# Patient Record
Sex: Male | Born: 1965
Health system: Southern US, Community
[De-identification: ages and names within clinical notes are randomized; demographics above are authoritative.]

## PROBLEM LIST (undated history)

## (undated) DIAGNOSIS — M87059 Idiopathic aseptic necrosis of unspecified femur: Secondary | ICD-10-CM

## (undated) DIAGNOSIS — Z77090 Contact with and (suspected) exposure to asbestos: Secondary | ICD-10-CM

## (undated) DIAGNOSIS — J449 Chronic obstructive pulmonary disease, unspecified: Secondary | ICD-10-CM

## (undated) DIAGNOSIS — M549 Dorsalgia, unspecified: Secondary | ICD-10-CM

## (undated) DIAGNOSIS — J439 Emphysema, unspecified: Secondary | ICD-10-CM

## (undated) DIAGNOSIS — M810 Age-related osteoporosis without current pathological fracture: Secondary | ICD-10-CM

## (undated) DIAGNOSIS — J61 Pneumoconiosis due to asbestos and other mineral fibers: Secondary | ICD-10-CM

## (undated) DIAGNOSIS — Z9981 Dependence on supplemental oxygen: Secondary | ICD-10-CM

## (undated) DIAGNOSIS — G629 Polyneuropathy, unspecified: Secondary | ICD-10-CM

## (undated) DIAGNOSIS — D491 Neoplasm of unspecified behavior of respiratory system: Secondary | ICD-10-CM

## (undated) HISTORY — PX: JOINT REPLACEMENT: SHX530

## (undated) HISTORY — PX: BACK SURGERY: SHX140

## (undated) HISTORY — PX: LUNG SURGERY: SHX703

## (undated) HISTORY — PX: EYE SURGERY: SHX253

---

## 1998-11-21 ENCOUNTER — Encounter: Payer: Self-pay | Admitting: Neurosurgery

## 1998-11-21 ENCOUNTER — Ambulatory Visit (HOSPITAL_COMMUNITY): Admission: RE | Admit: 1998-11-21 | Discharge: 1998-11-21 | Payer: Self-pay | Admitting: Neurosurgery

## 1999-07-04 ENCOUNTER — Inpatient Hospital Stay (HOSPITAL_COMMUNITY): Admission: EM | Admit: 1999-07-04 | Discharge: 1999-07-07 | Payer: Self-pay | Admitting: Emergency Medicine

## 1999-07-04 ENCOUNTER — Encounter: Payer: Self-pay | Admitting: Emergency Medicine

## 2000-07-03 ENCOUNTER — Encounter: Admission: RE | Admit: 2000-07-03 | Discharge: 2000-10-01 | Payer: Self-pay | Admitting: Anesthesiology

## 2001-04-16 ENCOUNTER — Encounter: Payer: Self-pay | Admitting: Family Medicine

## 2001-04-16 ENCOUNTER — Inpatient Hospital Stay (HOSPITAL_COMMUNITY): Admission: RE | Admit: 2001-04-16 | Discharge: 2001-04-18 | Payer: Self-pay | Admitting: Family Medicine

## 2001-07-31 ENCOUNTER — Ambulatory Visit: Admission: RE | Admit: 2001-07-31 | Discharge: 2001-07-31 | Payer: Self-pay | Admitting: Family Medicine

## 2002-10-31 ENCOUNTER — Encounter: Payer: Self-pay | Admitting: Family Medicine

## 2002-10-31 ENCOUNTER — Ambulatory Visit (HOSPITAL_COMMUNITY): Admission: RE | Admit: 2002-10-31 | Discharge: 2002-10-31 | Payer: Self-pay | Admitting: Family Medicine

## 2002-11-07 ENCOUNTER — Ambulatory Visit (HOSPITAL_COMMUNITY): Admission: RE | Admit: 2002-11-07 | Discharge: 2002-11-07 | Payer: Self-pay | Admitting: Family Medicine

## 2003-05-12 ENCOUNTER — Inpatient Hospital Stay (HOSPITAL_COMMUNITY): Admission: EM | Admit: 2003-05-12 | Discharge: 2003-05-22 | Payer: Self-pay | Admitting: Emergency Medicine

## 2003-05-29 ENCOUNTER — Ambulatory Visit (HOSPITAL_COMMUNITY): Admission: RE | Admit: 2003-05-29 | Discharge: 2003-05-29 | Payer: Self-pay | Admitting: Family Medicine

## 2004-03-26 ENCOUNTER — Emergency Department (HOSPITAL_COMMUNITY): Admission: EM | Admit: 2004-03-26 | Discharge: 2004-03-26 | Payer: Self-pay | Admitting: Emergency Medicine

## 2004-06-18 ENCOUNTER — Ambulatory Visit (HOSPITAL_COMMUNITY): Admission: RE | Admit: 2004-06-18 | Discharge: 2004-06-18 | Payer: Self-pay | Admitting: Internal Medicine

## 2004-06-30 ENCOUNTER — Ambulatory Visit: Payer: Self-pay | Admitting: Orthopedic Surgery

## 2004-07-16 ENCOUNTER — Inpatient Hospital Stay (HOSPITAL_COMMUNITY): Admission: AD | Admit: 2004-07-16 | Discharge: 2004-07-22 | Payer: Self-pay | Admitting: Internal Medicine

## 2004-07-21 ENCOUNTER — Ambulatory Visit: Payer: Self-pay | Admitting: Internal Medicine

## 2004-09-04 ENCOUNTER — Emergency Department (HOSPITAL_COMMUNITY): Admission: EM | Admit: 2004-09-04 | Discharge: 2004-09-04 | Payer: Self-pay | Admitting: Emergency Medicine

## 2004-09-23 ENCOUNTER — Ambulatory Visit (HOSPITAL_COMMUNITY): Admission: RE | Admit: 2004-09-23 | Discharge: 2004-09-23 | Payer: Self-pay | Admitting: Orthopedic Surgery

## 2004-10-18 ENCOUNTER — Inpatient Hospital Stay (HOSPITAL_COMMUNITY): Admission: AD | Admit: 2004-10-18 | Discharge: 2004-10-23 | Payer: Self-pay | Admitting: Family Medicine

## 2004-10-25 ENCOUNTER — Ambulatory Visit: Payer: Self-pay | Admitting: Internal Medicine

## 2004-10-25 ENCOUNTER — Inpatient Hospital Stay (HOSPITAL_COMMUNITY): Admission: AD | Admit: 2004-10-25 | Discharge: 2004-11-02 | Payer: Self-pay | Admitting: Internal Medicine

## 2004-11-14 ENCOUNTER — Ambulatory Visit: Payer: Self-pay | Admitting: Internal Medicine

## 2004-11-14 ENCOUNTER — Ambulatory Visit: Payer: Self-pay | Admitting: Physical Medicine & Rehabilitation

## 2004-11-14 ENCOUNTER — Inpatient Hospital Stay (HOSPITAL_COMMUNITY): Admission: RE | Admit: 2004-11-14 | Discharge: 2004-11-18 | Payer: Self-pay | Admitting: Orthopedic Surgery

## 2005-01-11 ENCOUNTER — Encounter (HOSPITAL_COMMUNITY): Admission: RE | Admit: 2005-01-11 | Discharge: 2005-02-10 | Payer: Self-pay | Admitting: Orthopedic Surgery

## 2005-02-14 ENCOUNTER — Encounter (HOSPITAL_COMMUNITY): Admission: RE | Admit: 2005-02-14 | Discharge: 2005-03-01 | Payer: Self-pay | Admitting: Orthopedic Surgery

## 2005-03-07 ENCOUNTER — Encounter (HOSPITAL_COMMUNITY): Admission: RE | Admit: 2005-03-07 | Discharge: 2005-04-06 | Payer: Self-pay | Admitting: Orthopedic Surgery

## 2005-04-07 ENCOUNTER — Encounter (HOSPITAL_COMMUNITY): Admission: RE | Admit: 2005-04-07 | Discharge: 2005-05-07 | Payer: Self-pay | Admitting: Orthopedic Surgery

## 2005-05-09 ENCOUNTER — Encounter (HOSPITAL_COMMUNITY): Admission: RE | Admit: 2005-05-09 | Discharge: 2005-06-08 | Payer: Self-pay | Admitting: Orthopedic Surgery

## 2005-10-02 ENCOUNTER — Emergency Department (HOSPITAL_COMMUNITY): Admission: EM | Admit: 2005-10-02 | Discharge: 2005-10-02 | Payer: Self-pay | Admitting: Emergency Medicine

## 2007-06-21 ENCOUNTER — Ambulatory Visit (HOSPITAL_COMMUNITY): Admission: RE | Admit: 2007-06-21 | Discharge: 2007-06-21 | Payer: Self-pay | Admitting: Internal Medicine

## 2009-01-12 ENCOUNTER — Inpatient Hospital Stay (HOSPITAL_COMMUNITY): Admission: EM | Admit: 2009-01-12 | Discharge: 2009-01-18 | Payer: Self-pay | Admitting: Internal Medicine

## 2009-01-12 ENCOUNTER — Ambulatory Visit: Payer: Self-pay | Admitting: Cardiology

## 2009-01-14 ENCOUNTER — Encounter (INDEPENDENT_AMBULATORY_CARE_PROVIDER_SITE_OTHER): Payer: Self-pay | Admitting: Internal Medicine

## 2009-04-06 ENCOUNTER — Ambulatory Visit (HOSPITAL_COMMUNITY): Admission: RE | Admit: 2009-04-06 | Discharge: 2009-04-06 | Payer: Self-pay | Admitting: Internal Medicine

## 2009-06-23 ENCOUNTER — Ambulatory Visit (HOSPITAL_COMMUNITY): Admission: RE | Admit: 2009-06-23 | Discharge: 2009-06-23 | Payer: Self-pay | Admitting: Family Medicine

## 2009-07-12 ENCOUNTER — Ambulatory Visit (HOSPITAL_COMMUNITY): Admission: RE | Admit: 2009-07-12 | Discharge: 2009-07-12 | Payer: Self-pay | Admitting: Family Medicine

## 2010-01-09 ENCOUNTER — Emergency Department (HOSPITAL_COMMUNITY)
Admission: EM | Admit: 2010-01-09 | Discharge: 2010-01-09 | Payer: Self-pay | Source: Home / Self Care | Admitting: Emergency Medicine

## 2010-03-27 ENCOUNTER — Inpatient Hospital Stay (HOSPITAL_COMMUNITY): Admission: EM | Admit: 2010-03-27 | Discharge: 2010-04-03 | Payer: Self-pay | Source: Home / Self Care

## 2010-03-27 ENCOUNTER — Encounter: Payer: Self-pay | Admitting: Orthopedic Surgery

## 2010-03-27 ENCOUNTER — Encounter: Payer: Self-pay | Admitting: Family Medicine

## 2010-03-28 NOTE — H&P (Addendum)
Wesley [name], Wesley [name]              ACCOUNT NO.:  [id number]  MEDICAL RECORD NO.:  [id number]          PATIENT TYPE:  INP  LOCATION:  IC08                          FACILITY:  [facility]  PHYSICIAN:  Wilson Singer, M.D.DATE OF BIRTH:  Jul 28, 1965  DATE OF ADMISSION:  03/27/2010 DATE OF DISCHARGE:  LH                             HISTORY & PHYSICAL   CHIEF COMPLAINT:  Dyspnea and cough.  HISTORY OF PRESENTING ILLNESS:  This 45 year old man who is known to have severe COPD, gives a one to almost 2 month history of dyspnea on minimal exertion associated with cough, more recently productive of yellow greenish sputum.  He denies any significant chest pain and there has been no hemoptysis.  He presents to the emergency room and when I saw him, he was on BiPAP machine for respiratory failure.  The patient normally takes Spiriva and ProAir HFA for his COPD.  He continues to be a longstanding smoker.  PAST MEDICAL HISTORY:  Asthma, COPD, chronic pain syndrome.  PAST SURGICAL HISTORY:  Left hip replacement and he apparently has problems with his right hip.  SOCIAL HISTORY:  The patient is married and he works as a Engineer, maintenance.  He smokes too much at least a pack a day.  He does not drink alcohol.  FAMILY HISTORY:  Noncontributory.  ALLERGIES:  AMBIEN.  MEDICATIONS:  Roxicodone for chronic pain, Spiriva inhaler and ProAir HFA.  REVIEW OF SYSTEMS:  Apart from the symptoms mentioned above, there are no other symptoms referable to all systems reviewed.  PHYSICAL EXAMINATION:  VITAL SIGNS:  Temperature 99.7, blood pressure 122/53, pulse 19 in sinus rhythm, respiratory rate 20, saturation 94% on BiPAP machine. GENERAL:  He is not clubbed.  There is no peripheral or central cyanosis.  His peripheries are warm.  He does not appear to be toxic or shock.  He is alert and mentating well. CARDIOVASCULAR:  Heart sounds are present and normal without murmurs. Jugular venous pressure  is not raised.  RESPIRATORY:  Lung fields show reduced air entry bilaterally and there are some crackles in both mid and lower zones.  There is no bronchial breathing.  Bilateral tight wheezing heard all throughout both lung fields. ABDOMEN:  Soft and nontender with no hepatosplenomegaly. NEUROLOGIC:  He is alert and oriented without any focal neurologic signs. SKIN:  There are no skin lesions or rashes. MUSCULOSKELETAL:  There are no major abnormalities.  INVESTIGATIONS:  Chest x-ray shows severe emphysematous changes and patchy air space disease bilaterally in the lower lobe suspicious for evolving infiltrate and/or pneumonia.  Lab work shows hemoglobin of 13.4, white blood cell count 20.1, platelets 318.  Sodium 130, potassium 4.3, bicarbonate 30, glucose 141, BUN 9, creatinine 0.74.  Arterial blood gases on 4 L of oxygen show pH of 7.278, pCO2 of 69.1, pO2 of 65.3 and saturation of 91%.  PROBLEM LIST: 1. Severe chronic obstructive pulmonary disease. 2. Bilateral lower lobe pneumonia. 3. Chronic pain syndrome.  PLAN: 1. Admit to Intensive Care Unit. 2. Intravenous antibiotics. 3. Intravenous steroids. 4. Continue BiPAP for the time-being, although hopefully become     deliberating from  this and rather soon as his bronchospasm     improves. 5. Full Code.  Further recommendations will depend on the patient's hospital progress.     Wilson Singer, M.D.     NCG/MEDQ  D:  03/27/2010  T:  03/27/2010  Job:  [id number]  Electronically Signed by Lilly Cove M.D. on 03/28/2010 12:57:56 PM

## 2010-03-29 LAB — EXPECTORATED SPUTUM ASSESSMENT W GRAM STAIN, RFLX TO RESP C

## 2010-03-29 LAB — CBC
HCT: 39.3 % (ref 39.0–52.0)
HCT: 38.5 % — ABNORMAL LOW (ref 39.0–52.0)
Hemoglobin: 13.4 g/dL (ref 13.0–17.0)
Hemoglobin: 12.8 g/dL — ABNORMAL LOW (ref 13.0–17.0)
MCH: 32.1 pg (ref 26.0–34.0)
MCHC: 34.1 g/dL (ref 30.0–36.0)
MCHC: 33.2 g/dL (ref 30.0–36.0)
Platelets: 305 10*3/uL (ref 150–400)
RBC: 4.17 MIL/uL — ABNORMAL LOW (ref 4.22–5.81)
RDW: 12.3 % (ref 11.5–15.5)
WBC: 20.1 10*3/uL — ABNORMAL HIGH (ref 4.0–10.5)
WBC: 19.2 10*3/uL — ABNORMAL HIGH (ref 4.0–10.5)

## 2010-03-29 LAB — COMPREHENSIVE METABOLIC PANEL
ALT: 18 U/L (ref 0–53)
AST: 15 U/L (ref 0–37)
AST: 15 U/L (ref 0–37)
Alkaline Phosphatase: 73 U/L (ref 39–117)
Alkaline Phosphatase: 74 U/L (ref 39–117)
BUN: 11 mg/dL (ref 6–23)
CO2: 30 mEq/L (ref 19–32)
Calcium: 8.6 mg/dL (ref 8.4–10.5)
Chloride: 93 mEq/L — ABNORMAL LOW (ref 96–112)
Creatinine, Ser: 0.71 mg/dL (ref 0.4–1.5)
GFR calc non Af Amer: >60 mL/min (ref >60)
Potassium: 4.1 mEq/L (ref 3.5–5.1)
Sodium: 131 mEq/L — ABNORMAL LOW (ref 135–145)
Total Protein: 7.3 g/dL (ref 6.0–8.3)

## 2010-03-29 LAB — CARDIAC PANEL(CRET KIN+CKTOT+MB+TROPI)
CK, MB: 1.9 ng/mL (ref 0.3–4.0)
CK, MB: 1.9 ng/mL (ref 0.3–4.0)
Relative Index: INVALID (ref 0.0–2.5)
Total CK: 55 U/L (ref 7–232)
Troponin I: 0.01 ng/mL (ref 0.00–0.06)
Troponin I: 0.01 ng/mL (ref 0.00–0.06)

## 2010-03-29 LAB — DIFFERENTIAL
Basophils Absolute: 0 10*3/uL (ref 0.0–0.1)
Basophils Relative: 0 % (ref 0–1)
Eosinophils Absolute: 0 10*3/uL (ref 0.0–0.7)
Eosinophils Relative: 0 % (ref 0–5)
Lymphocytes Relative: 7 % — ABNORMAL LOW (ref 12–46)
Lymphocytes Relative: 2 % — ABNORMAL LOW (ref 12–46)
Monocytes Relative: 6 % (ref 3–12)
Monocytes Relative: 3 % (ref 3–12)
Neutro Abs: 18.1 10*3/uL — ABNORMAL HIGH (ref 1.7–7.7)

## 2010-03-29 LAB — MRSA PCR SCREENING: MRSA by PCR: POSITIVE — AB

## 2010-03-29 LAB — BLOOD GAS, ARTERIAL
Delivery systems: POSITIVE
FIO2: 0.35 %
O2 Saturation: 91 %
O2 Saturation: 94.5 %
pCO2 arterial: 55.8 mmHg — ABNORMAL HIGH (ref 35.0–45.0)
pH, Arterial: 7.278 — ABNORMAL LOW (ref 7.350–7.450)
pH, Arterial: 7.358 (ref 7.350–7.450)

## 2010-03-29 LAB — BRAIN NATRIURETIC PEPTIDE
Pro B Natriuretic peptide (BNP): 88 pg/mL (ref 0.0–100.0)
Pro B Natriuretic peptide (BNP): 78.7 pg/mL (ref 0.0–100.0)

## 2010-03-30 LAB — DIFFERENTIAL
Basophils Absolute: 0 10*3/uL (ref 0.0–0.1)
Basophils Relative: 0 % (ref 0–1)
Eosinophils Absolute: 0 10*3/uL (ref 0.0–0.7)
Eosinophils Absolute: 0 10*3/uL (ref 0.0–0.7)
Lymphs Abs: 1 10*3/uL (ref 0.7–4.0)
Monocytes Absolute: 2.2 10*3/uL — ABNORMAL HIGH (ref 0.1–1.0)
Monocytes Relative: 5 % (ref 3–12)
Neutro Abs: 22.2 10*3/uL — ABNORMAL HIGH (ref 1.7–7.7)
Neutro Abs: 18 10*3/uL — ABNORMAL HIGH (ref 1.7–7.7)
Neutrophils Relative %: 91 % — ABNORMAL HIGH (ref 43–77)
Neutrophils Relative %: 84 % — ABNORMAL HIGH (ref 43–77)

## 2010-03-30 LAB — BASIC METABOLIC PANEL
BUN: 11 mg/dL (ref 6–23)
CO2: 28 mEq/L (ref 19–32)
CO2: 34 mEq/L — ABNORMAL HIGH (ref 19–32)
Calcium: 8.7 mg/dL (ref 8.4–10.5)
Chloride: 103 mEq/L (ref 96–112)
Creatinine, Ser: 0.61 mg/dL (ref 0.4–1.5)
Creatinine, Ser: 0.59 mg/dL (ref 0.4–1.5)
GFR calc Af Amer: >60 mL/min (ref >60)
Glucose, Bld: 193 mg/dL — ABNORMAL HIGH (ref 70–99)

## 2010-03-30 LAB — CBC
Hemoglobin: 12 g/dL — ABNORMAL LOW (ref 13.0–17.0)
Hemoglobin: 11.9 g/dL — ABNORMAL LOW (ref 13.0–17.0)
MCH: 31.6 pg (ref 26.0–34.0)
MCHC: 33.4 g/dL (ref 30.0–36.0)
MCV: 93.7 fL (ref 78.0–100.0)
Platelets: 403 10*3/uL — ABNORMAL HIGH (ref 150–400)
RBC: 3.8 MIL/uL — ABNORMAL LOW (ref 4.22–5.81)

## 2010-03-30 LAB — HEMOGLOBIN A1C: Mean Plasma Glucose: 117 mg/dL — ABNORMAL HIGH (ref <117)

## 2010-03-30 LAB — STREP PNEUMONIAE ANTIBODY SEROTYPES

## 2010-03-31 LAB — CBC
HCT: 37.4 % — ABNORMAL LOW (ref 39.0–52.0)
MCHC: 32.6 g/dL (ref 30.0–36.0)
Platelets: 402 10*3/uL — ABNORMAL HIGH (ref 150–400)
RDW: 12.5 % (ref 11.5–15.5)
WBC: 16.5 10*3/uL — ABNORMAL HIGH (ref 4.0–10.5)

## 2010-03-31 LAB — DIFFERENTIAL
Basophils Absolute: 0.2 10*3/uL — ABNORMAL HIGH (ref 0.0–0.1)
Basophils Relative: 1 % (ref 0–1)
Eosinophils Absolute: 0.1 10*3/uL (ref 0.0–0.7)
Lymphs Abs: 2.3 10*3/uL (ref 0.7–4.0)
Neutrophils Relative %: 72 % (ref 43–77)
WBC Morphology: INCREASED

## 2010-03-31 LAB — GLUCOSE, CAPILLARY
Glucose-Capillary: 110 mg/dL — ABNORMAL HIGH (ref 70–99)
Glucose-Capillary: 109 mg/dL — ABNORMAL HIGH (ref 70–99)
Glucose-Capillary: 176 mg/dL — ABNORMAL HIGH (ref 70–99)

## 2010-03-31 LAB — CULTURE, BLOOD (ROUTINE X 2): Culture  Setup Time: 201201231810

## 2010-04-01 LAB — GLUCOSE, CAPILLARY
Glucose-Capillary: 112 mg/dL — ABNORMAL HIGH (ref 70–99)
Glucose-Capillary: 134 mg/dL — ABNORMAL HIGH (ref 70–99)
Glucose-Capillary: 250 mg/dL — ABNORMAL HIGH (ref 70–99)
Glucose-Capillary: 124 mg/dL — ABNORMAL HIGH (ref 70–99)

## 2010-04-01 LAB — CULTURE, RESPIRATORY W GRAM STAIN

## 2010-04-01 LAB — CBC
Hemoglobin: 12.7 g/dL — ABNORMAL LOW (ref 13.0–17.0)
MCH: 31.3 pg (ref 26.0–34.0)
MCV: 94.8 fL (ref 78.0–100.0)
Platelets: 442 10*3/uL — ABNORMAL HIGH (ref 150–400)
RBC: 4.06 MIL/uL — ABNORMAL LOW (ref 4.22–5.81)
WBC: 18.7 10*3/uL — ABNORMAL HIGH (ref 4.0–10.5)

## 2010-04-01 LAB — BASIC METABOLIC PANEL
BUN: 10 mg/dL (ref 6–23)
CO2: 33 mEq/L — ABNORMAL HIGH (ref 19–32)
Calcium: 8.4 mg/dL (ref 8.4–10.5)
Chloride: 102 mEq/L (ref 96–112)
Creatinine, Ser: 0.61 mg/dL (ref 0.4–1.5)
GFR calc Af Amer: >60 mL/min (ref >60)
GFR calc non Af Amer: >60 mL/min (ref >60)
Glucose, Bld: 98 mg/dL (ref 70–99)
Potassium: 4.4 mEq/L (ref 3.5–5.1)
Sodium: 139 mEq/L (ref 135–145)

## 2010-04-01 LAB — DIFFERENTIAL
Basophils Absolute: 0.2 10*3/uL — ABNORMAL HIGH (ref 0.0–0.1)
Basophils Relative: 1 % (ref 0–1)
Eosinophils Absolute: 0.1 10*3/uL (ref 0.0–0.7)
Eosinophils Relative: 1 % (ref 0–5)
Lymphocytes Relative: 12 % (ref 12–46)
Lymphs Abs: 2.3 10*3/uL (ref 0.7–4.0)
Monocytes Absolute: 1.7 10*3/uL — ABNORMAL HIGH (ref 0.1–1.0)
Monocytes Relative: 9 % (ref 3–12)
Neutro Abs: 14.3 10*3/uL — ABNORMAL HIGH (ref 1.7–7.7)
Neutrophils Relative %: 77 % (ref 43–77)

## 2010-04-01 LAB — CULTURE, BLOOD (ROUTINE X 2): Culture: NO GROWTH

## 2010-04-01 LAB — LEGIONELLA PNEUMOPHILA ANTIBODIES: Legionella pneumo, IgG Type 1-6: <1:128 {titer}

## 2010-04-02 LAB — GLUCOSE, CAPILLARY
Glucose-Capillary: 109 mg/dL — ABNORMAL HIGH (ref 70–99)
Glucose-Capillary: 138 mg/dL — ABNORMAL HIGH (ref 70–99)

## 2010-04-02 LAB — CBC
HCT: 38 % — ABNORMAL LOW (ref 39.0–52.0)
Hemoglobin: 12.7 g/dL — ABNORMAL LOW (ref 13.0–17.0)
MCH: 31.8 pg (ref 26.0–34.0)
MCHC: 33.4 g/dL (ref 30.0–36.0)
MCV: 95 fL (ref 78.0–100.0)
RBC: 4 MIL/uL — ABNORMAL LOW (ref 4.22–5.81)

## 2010-04-02 LAB — DIFFERENTIAL
Basophils Absolute: 0.4 10*3/uL — ABNORMAL HIGH (ref 0.0–0.1)
Basophils Relative: 2 % — ABNORMAL HIGH (ref 0–1)
Eosinophils Relative: 2 % (ref 0–5)
Lymphocytes Relative: 17 % (ref 12–46)
Lymphs Abs: 3.6 10*3/uL (ref 0.7–4.0)
Monocytes Relative: 7 % (ref 3–12)
Neutro Abs: 15.5 10*3/uL — ABNORMAL HIGH (ref 1.7–7.7)

## 2010-04-03 LAB — GLUCOSE, CAPILLARY: Glucose-Capillary: 115 mg/dL — ABNORMAL HIGH (ref 70–99)

## 2010-04-09 NOTE — Discharge Summary (Signed)
NAMESHEY, BARTMESS NO.:  [id number]  MEDICAL RECORD NO.:  [id number]          PATIENT TYPE:  INP  LOCATION:  A337                          FACILITY:  [facility]  PHYSICIAN:  Pleas Koch, MD        DATE OF BIRTH:  1965-04-22  DATE OF ADMISSION:  03/27/2010 DATE OF DISCHARGE:  01/29/2012LH                              DISCHARGE SUMMARY   PRIMARY CARE PHYSICIAN:  Madelin Rear. Sherwood Gambler, MD  PAIN MANAGEMENT PHYSICIAN:  Kofi A. Gerilyn Pilgrim, MD  DISCHARGE DIAGNOSES: 1. Strep pneumonia, bacteremia. 2. Chronic obstructive pulmonary exacerbation (severe). 3. Impaired glucose tolerance secondary to IV steroids. 4. Chronic pain. 5. MRSA positive state . 6. Tobacco abuse. 7. Chronic pain syndrome. 8. Hypercarbia. 9. Avascular necrosis secondary to use of intermittent steroids status     post hip replacement.  DISCHARGE MEDICATIONS: 1. Spiriva 18 mcg 1 capsule daily. 2. Oxycodone 30 mg 1 tablet b.i.d. p.r.n. 3. Aleve 220 mg p.o. 2 tablets b.i.d. p.r.n. 4. Advair 250/50, 2 puffs b.i.d. 5. Albuterol nebulization 1 neb q.4 p.r.n. 6. ProAir inhaler 1 puff q.4 p.r.n. 7. DayQuil over-the-counter 1-2 capsules daily. 8. Nucynta extended release 1 tablet 3 times a day. 9. NyQuil over-the-counter 1 capsule at bedtime. 10.The patient was given a limited prescription for     hydrocodone/homatropine syrup for cough suppression and will follow     up with Dr. Gerilyn Pilgrim for further prescription of the same.  The     patient was given nicotine patch 21 mg over 24 hours, given 30     patches. 11.The patient was given levofloxacin 500 mg 1 tablet daily for 2     days.  HISTORY OF PRESENT ILLNESS:  A 45 year old male who is known to have severe COPD, Gold criteria end-stage III-IV, asthma, chronic pain syndrome, tobacco abuse, status post left hip replacement presented with almost 11-month history of dyspnea with minimal exertion with cough, more recently productive of yellowish greenish  sputum.  Denies any significant chest pain, no hemoptysis, and presented to the emergency room on BiPAP for respiratory failure.  He is a longstanding smoker.  PH initially was 7.3, pCO2 of 69, pO2 of 55.3, and saturation of 91%. WBC was 13.4, white blood cell count was 20.1.  Sodium 130, potassium 4.3, bicarb 30, and BUN/creatinine 9 and 0.74.  He was admitted with severe COPD.  The patient was admitted to ICU and did better subsequently.  Dyspnea seemed to be relieved and the patient was transitioned to p.o. steroids on the 24th.  He had no increased work of breathing at that time.  He was seen subsequently and improved steadily until day of discharge.  He will be discharged home on tapering doses of steroids.  He will need to follow up with primary care physician to determine if further workup is needed and if he needs to have a pulmonary consult.  Strep pneumo bacteremia.  The patient does use oxygen at home and will need to continue this.  Leukocytosis.  This is likely secondary to bacterial superinfection. The patient states he has been coughing since November.  His wife had a  surgery in her lower back.  His brother was recently discharged with MRSA positive state and then had surgery removing 1 of __________.  He has not been able to take care of himself and promises and states that he wish he had done so.  It was noted that his sputum became darker in color and the patient was concerned that this was blood, however, this is not the case.  Eventually it was found out that he had strep pneumo bacteremia which was sensitive to ceftriaxone, levofloxacin, resistant to penicillin.  Blood cultures done on day of admission also showed strep pneumo which was concurrent.  Hence he was transitioned after 3 days of moxifloxacin to levofloxacin IV on the 27th and then was transitioned to p.o. levofloxacin on the morning of April 03, 2010.  His white count trended back and forth  upwards and downwards and was still elevated on discharge but this is likely secondary to demargination from chronic steroid use.  I anticipate the patient would do very well.  Subsequently as noted earlier, he had been on 4-5 days of IV steroids.  She had been on methylprednisone initially and then was transitioned to p.o. steroids and we will need to continue tapering dose of 40 mg on the 30th and then 20 mg on 31st and 20 mg on February 1 and then will stop taking this.  Impaired glucose tolerance.  It was noticed blood sugars ranged from the 110s to the 150s and this is again secondary to steroid use.  The patient will need to follow with his primary care physician Dr. Sherwood Gambler to ensure that this resolved.  Chronic pain syndrome.  This is followed by Dr. Gerilyn Pilgrim and the patient has recently established care with him.  As such he has not been discharged on any pain medications other than limited dose of oxycodone syrup with homatropine which the patient requested as he is not able to get to his primary care physician right away.  The patient does have refills of his medications and will not be given any further pain meds until seen by Dr. Gerilyn Pilgrim.  He does appear to have pain management contract with him.  The pain is likely secondary to his avascular necrosis and various other areas of pain in his knees and so forth.  The patient will be discharged home on those medications.  Tobacco abuse.  The patient was instructed to continue his patches 21 mg over 24 hours for another 30 days and try to quit.  The patient was encouraged to pursue smoking cessation.  MRSA positive state.  The patient got Bactroban to nares and will need to continue using this for another 7 days after discharge.  Prescription was given for Bactrim.  The patient was discharged home in stable state.  Blood pressure is 94- 97/54-58 and the patient is at baseline in the low 100, temperature 97.9, pulse was 86,  respirations 18-20 and he was 97% on 2 liters nasal cannula.  I spent over 30 minutes of time in his discharge and coordinating his outpatient followup.                                           ______________________________ Pleas Koch, MD     JS/MEDQ  D:  04/03/2010  T:  04/03/2010  Job:  [id number]  Electronically Signed by Pleas Koch MD on 04/09/2010 06:43:07 PM

## 2010-05-17 LAB — CBC
HCT: 52.6 % — ABNORMAL HIGH (ref 39.0–52.0)
MCHC: 33.3 g/dL (ref 30.0–36.0)
Platelets: 289 10*3/uL (ref 150–400)
RDW: 13 % (ref 11.5–15.5)

## 2010-05-17 LAB — DIFFERENTIAL
Lymphs Abs: 2.9 10*3/uL (ref 0.7–4.0)
Monocytes Absolute: 1.3 10*3/uL — ABNORMAL HIGH (ref 0.1–1.0)
Monocytes Relative: 9 % (ref 3–12)
Neutro Abs: 10.8 10*3/uL — ABNORMAL HIGH (ref 1.7–7.7)
Neutrophils Relative %: 72 % (ref 43–77)

## 2010-05-17 LAB — URINALYSIS, ROUTINE W REFLEX MICROSCOPIC
Glucose, UA: NEGATIVE mg/dL
Hgb urine dipstick: NEGATIVE
Specific Gravity, Urine: >1.03 — ABNORMAL HIGH (ref 1.005–1.030)

## 2010-05-17 LAB — COMPREHENSIVE METABOLIC PANEL
ALT: 20 U/L (ref 0–53)
Albumin: 4.8 g/dL (ref 3.5–5.2)
BUN: 13 mg/dL (ref 6–23)
Calcium: 10.7 mg/dL — ABNORMAL HIGH (ref 8.4–10.5)
Glucose, Bld: 111 mg/dL — ABNORMAL HIGH (ref 70–99)
Sodium: 141 mEq/L (ref 135–145)
Total Protein: 8.1 g/dL (ref 6.0–8.3)

## 2010-05-17 LAB — URINE MICROSCOPIC-ADD ON

## 2010-06-08 LAB — DIFFERENTIAL
Basophils Absolute: 0 10*3/uL (ref 0.0–0.1)
Basophils Absolute: 0 10*3/uL (ref 0.0–0.1)
Basophils Absolute: 0 10*3/uL (ref 0.0–0.1)
Basophils Relative: 0 % (ref 0–1)
Basophils Relative: 0 % (ref 0–1)
Basophils Relative: 0 % (ref 0–1)
Eosinophils Absolute: 0 10*3/uL (ref 0.0–0.7)
Eosinophils Absolute: 0.2 10*3/uL (ref 0.0–0.7)
Eosinophils Relative: 0 % (ref 0–5)
Eosinophils Relative: 0 % (ref 0–5)
Eosinophils Relative: 1 % (ref 0–5)
Eosinophils Relative: 2 % (ref 0–5)
Lymphocytes Relative: 18 % (ref 12–46)
Lymphocytes Relative: 4 % — ABNORMAL LOW (ref 12–46)
Lymphs Abs: 1.9 10*3/uL (ref 0.7–4.0)
Lymphs Abs: 3.2 10*3/uL (ref 0.7–4.0)
Monocytes Absolute: 0.1 10*3/uL (ref 0.1–1.0)
Monocytes Absolute: 0.5 10*3/uL (ref 0.1–1.0)
Monocytes Relative: 3 % (ref 3–12)
Neutro Abs: 17.2 10*3/uL — ABNORMAL HIGH (ref 1.7–7.7)
Neutrophils Relative %: 73 % (ref 43–77)
Neutrophils Relative %: 93 % — ABNORMAL HIGH (ref 43–77)

## 2010-06-08 LAB — GLUCOSE, CAPILLARY
Glucose-Capillary: 106 mg/dL — ABNORMAL HIGH (ref 70–99)
Glucose-Capillary: 106 mg/dL — ABNORMAL HIGH (ref 70–99)
Glucose-Capillary: 117 mg/dL — ABNORMAL HIGH (ref 70–99)
Glucose-Capillary: 123 mg/dL — ABNORMAL HIGH (ref 70–99)
Glucose-Capillary: 128 mg/dL — ABNORMAL HIGH (ref 70–99)
Glucose-Capillary: 132 mg/dL — ABNORMAL HIGH (ref 70–99)
Glucose-Capillary: 133 mg/dL — ABNORMAL HIGH (ref 70–99)
Glucose-Capillary: 148 mg/dL — ABNORMAL HIGH (ref 70–99)
Glucose-Capillary: 149 mg/dL — ABNORMAL HIGH (ref 70–99)
Glucose-Capillary: 167 mg/dL — ABNORMAL HIGH (ref 70–99)
Glucose-Capillary: 175 mg/dL — ABNORMAL HIGH (ref 70–99)
Glucose-Capillary: 197 mg/dL — ABNORMAL HIGH (ref 70–99)
Glucose-Capillary: 200 mg/dL — ABNORMAL HIGH (ref 70–99)
Glucose-Capillary: 204 mg/dL — ABNORMAL HIGH (ref 70–99)
Glucose-Capillary: 271 mg/dL — ABNORMAL HIGH (ref 70–99)
Glucose-Capillary: 452 mg/dL — ABNORMAL HIGH (ref 70–99)
Glucose-Capillary: 97 mg/dL (ref 70–99)

## 2010-06-08 LAB — BASIC METABOLIC PANEL
BUN: 10 mg/dL (ref 6–23)
BUN: 10 mg/dL (ref 6–23)
BUN: 11 mg/dL (ref 6–23)
CO2: 28 mEq/L (ref 19–32)
CO2: 33 mEq/L — ABNORMAL HIGH (ref 19–32)
Calcium: 8.6 mg/dL (ref 8.4–10.5)
Calcium: 9.4 mg/dL (ref 8.4–10.5)
Chloride: 103 mEq/L (ref 96–112)
Creatinine, Ser: 0.76 mg/dL (ref 0.4–1.5)
Creatinine, Ser: 0.77 mg/dL (ref 0.4–1.5)
Creatinine, Ser: 0.81 mg/dL (ref 0.4–1.5)
GFR calc non Af Amer: >60 mL/min (ref >60)
GFR calc non Af Amer: >60 mL/min (ref >60)
Glucose, Bld: 103 mg/dL — ABNORMAL HIGH (ref 70–99)
Glucose, Bld: 149 mg/dL — ABNORMAL HIGH (ref 70–99)
Glucose, Bld: 157 mg/dL — ABNORMAL HIGH (ref 70–99)
Potassium: 3.6 mEq/L (ref 3.5–5.1)
Potassium: 3.7 mEq/L (ref 3.5–5.1)
Potassium: 4.7 mEq/L (ref 3.5–5.1)
Sodium: 138 mEq/L (ref 135–145)
Sodium: 138 mEq/L (ref 135–145)

## 2010-06-08 LAB — COMPREHENSIVE METABOLIC PANEL
ALT: 16 U/L (ref 0–53)
AST: 15 U/L (ref 0–37)
CO2: 34 mEq/L — ABNORMAL HIGH (ref 19–32)
Calcium: 9.6 mg/dL (ref 8.4–10.5)
Chloride: 97 mEq/L (ref 96–112)
GFR calc Af Amer: >60 mL/min (ref >60)
GFR calc non Af Amer: >60 mL/min (ref >60)
Potassium: 4.2 mEq/L (ref 3.5–5.1)
Sodium: 137 mEq/L (ref 135–145)

## 2010-06-08 LAB — URINALYSIS, ROUTINE W REFLEX MICROSCOPIC
Bilirubin Urine: NEGATIVE
Glucose, UA: NEGATIVE mg/dL
Ketones, ur: NEGATIVE mg/dL
Nitrite: NEGATIVE
pH: 5.5 (ref 5.0–8.0)

## 2010-06-08 LAB — BLOOD GAS, ARTERIAL
Acid-Base Excess: 4.4 mmol/L — ABNORMAL HIGH (ref 0.0–2.0)
O2 Saturation: 83.2 %
TCO2: 26.1 mmol/L (ref 0–100)

## 2010-06-08 LAB — CBC
HCT: 41.8 % (ref 39.0–52.0)
HCT: 44.8 % (ref 39.0–52.0)
Hemoglobin: 15.1 g/dL (ref 13.0–17.0)
MCHC: 33.5 g/dL (ref 30.0–36.0)
MCHC: 33.8 g/dL (ref 30.0–36.0)
MCHC: 34 g/dL (ref 30.0–36.0)
MCV: 94.4 fL (ref 78.0–100.0)
MCV: 95.1 fL (ref 78.0–100.0)
Platelets: 266 10*3/uL (ref 150–400)
Platelets: 287 10*3/uL (ref 150–400)
RBC: 4.24 MIL/uL (ref 4.22–5.81)
RBC: 4.81 MIL/uL (ref 4.22–5.81)
RDW: 12.1 % (ref 11.5–15.5)
RDW: 12.2 % (ref 11.5–15.5)
RDW: 12.3 % (ref 11.5–15.5)
WBC: 10.5 10*3/uL (ref 4.0–10.5)
WBC: 17.4 10*3/uL — ABNORMAL HIGH (ref 4.0–10.5)

## 2010-06-08 LAB — VANCOMYCIN, TROUGH: Vancomycin Tr: 6.7 ug/mL — ABNORMAL LOW (ref 10.0–20.0)

## 2010-06-08 LAB — CULTURE, RESPIRATORY W GRAM STAIN: Culture: NORMAL

## 2010-06-08 LAB — HEMOGLOBIN A1C: Hgb A1c MFr Bld: 6 % (ref 4.6–6.1)

## 2010-06-08 LAB — EXPECTORATED SPUTUM ASSESSMENT W GRAM STAIN, RFLX TO RESP C

## 2010-06-08 LAB — LIPASE, BLOOD: Lipase: 15 U/L (ref 11–59)
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
## 2010-07-22 NOTE — H&P (Signed)
[facility]  Patient:    Wesley [name], Wesley [name]                     MRN: [id number] Adm. Date:  [id number] Attending:  Thyra Breed CC:         Loran Senters, M.D.   History and Physical  FOLLOWUP EVALUATION  HISTORY OF PRESENT ILLNESS:  Wesley [name] comes in for follow-up evaluation of his low back pain on the basis of lumbar spondylosis.  Since his last evaluation, we adjusted his OxyContin to between 80-100 mg q.d.  He has noted that during the week when he is working he takes 80 mg q.d. and during the weekends he takes 100 mg.  He notes that if he takes 2 tablets in the morning without eating breakfast, he does not feel quite right, so this is the reason during the weekdays he is only taking 1 tablet in the morning, 1 at midday, and 2 in the evening.  He has required Percocet for breakthrough pain on average about 2-3 tablets q.d.  In general this has improved overall.  His other medications are unchanged.  He describes no new neurologic symptoms.  He feels better overall and feels like he is able to work a full day without feeling sedated or limited as much as he had been in the past.  PHYSICAL EXAMINATION:  VITAL SIGNS:  Blood pressure 112/48, heart rate 93, respiratory rate 16, and O2 saturation 96%.  Pain level 6/10.  EXTREMITIES:  Deep tendon reflexes were symmetric in the lower extremity with downgoing toes.  Motor was 5/5 and symmetric in bulk and tone.  He has good forward flexion with increased pain at 30 degrees with hyperextension.  IMPRESSION: 1. Low back pain on the basis of lumbar spondylosis. 2. Other medical problems per Dr. Loran Senters.  DISPOSITION: 1. Continue on OxyContin 20 mg 2 in the morning, 1 at midday, and 2 in the    evening, #150 with no refill. 2. Percocet 5/325 1 p.o. q.6h. p.r.n. breakthrough pain, #100 with no refill. 3. Followup with me in eight weeks.  He knows he is to contact our office for    a refill on his  medications between now and his next appointment. DD:  09/27/00 TD:  09/27/00 Job: [id number]
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
## 2010-07-22 NOTE — H&P (Signed)
[facility]  Patient:    Wesley [name], Wesley [name] Visit Number: [id number] MRN: [id number]          Service Type: MED Location: 3A A308 01 Attending Physician:  Harlow Asa Dictated by:   Donna Bernard, M.D. Admit Date:  04/16/2001 Discharge Date: 04/18/2001                           History and Physical  CHIEF COMPLAINT:  "Cannot breathe."  HISTORY OF PRESENT ILLNESS:  This patient is a 45 year old white male, with a history of COPD, asthma, and chronic low back pain, who presented to the office the day of admission with complaint of severe shortness of breath.  The patient had been seen in the office several days prior with significant wheezing and congestion in his chest.  In the office he had crackles in his right base suggestive of pneumonia.  The patient noted fever intermittently and a productive cough of yellowish phlegm.  The patient was given a prescription for oral Levaquin, given injection of Rocephin, and told to start oral Levaquin, with follow-up in 24 hours.  The patient returned 24 hours later stating he was having a very difficult time.  He had been awake all night before admission coughing and wheezing.  He was using his nebulizer upward of every two hours without success for his wheezing.  The patient claims compliance with his medicines.  MEDICATIONS:  1. OxyContin 20 mg two t.i.d.  2. Enteric-coated aspirin one q.d.  3. Xanax 0.5 mg in the morning, 1 mg q.h.s.  4. Paxil 20 mg q.d.  5. Prednisone, tapered dose.  6. Status post one Rocephin injection with     a. Initiation of Levaquin 400 mg q.d.  7. Albuterol nebulizer normally up to q.4h prior history for q.2h in the     24 hours prior to admission.  8. Flovent and Serevent b.i.d.  9. Hydrocodone cough medicine p.r.n.  ALLERGIES:  None.  PAST SURGICAL HISTORY:  None.  FAMILY HISTORY:  Significant for colon cancer, diabetes, coronary artery disease.  SOCIAL HISTORY:   The patient is married.  He is a Games developer. Significant smoking history.  REVIEW OF SYSTEMS:  Otherwise negative.  PHYSICAL EXAMINATION:  VITAL SIGNS:  TEMP 98.6 degrees, respiratory rate 36.  GENERAL:  The patient is alert, in moderate distress, breathing rapidly in the office upon presentation.  Nebulizer treatments were administered and the patient still had tachypnea with significant wheezes.  HEENT:  No nasal congestion.  Pharynx normal.  Moist mucous membranes.  NECK:  Supple.  LUNGS:  Bilateral expiratory wheezes.  Inspiratory crackles, primarily at right base.  BACK:  Low spine tender to percussion.  ABDOMEN:  Soft.  Good bowel sounds.  No rebound or guarding.  No masses.  EXTREMITIES:  Thin.  No deformities.  IMPRESSION:  Exacerbation of asthma, with failure on outpatient therapy.  PLAN:  1. Admit for IV steroids, IV antibiotics, frequent nebulizer therapy.  2. Nasal cannula oxygen.  3. Will check CBC, chest x-ray.  4. Will use Levaquin 500 mg IV q.24h and Solu-Medrol 60 mg IV q.6h.  5. Further orders as noted on chart. Dictated by:   Donna Bernard, M.D. Attending Physician:  Harlow Asa DD:  05/01/01 TD:  05/01/01 Job: [id number]

## 2010-07-22 NOTE — Discharge Summary (Signed)
NAMENAHEIM, BURGEN NO.:  [id number]   MEDICAL RECORD NO.:  [id number]          PATIENT TYPE:  INP   LOCATION:  1610                         FACILITY:  [facility]   PHYSICIAN:  Ollen Gross, M.D.    DATE OF BIRTH:  1965-10-23   DATE OF ADMISSION:  11/14/2004  DATE OF DISCHARGE:  11/18/2004                                 DISCHARGE SUMMARY   ADMISSION DIAGNOSES:  1.  Avascular necrosis, bilateral hips.  2.  Chronic obstructive pulmonary disease.  3.  History of pneumonia.  4.  Recent hospitalization secondary to septicemia, etiology unknown.  5.  Sleep apnea.   DISCHARGE DIAGNOSES:  1.  Avascular necrosis, left hip, status post left total hip replacement      arthroplasty.  2.  Chronic obstructive pulmonary disease.  3.  History of pneumonia.  4.  Recent hospitalization secondary to septicemia, etiology unknown.  5.  Mild hyponatremia postoperatively.  6.  Postoperative hypoxemia.  7.  Sleep apnea.   PROCEDURE:  On November 14, 2004, left total hip arthroplasty.   SURGEON:  Ollen Gross, M.D.   ASSISTANT:  Alexzandrew L. Perkins, P.A.-C.   ANESTHESIA:  Spinal.   BLOOD LOSS:  400 cc.   CONSULTS:  1.  Rehab services.  2.  Pulmonary, Dr. Sherene Sires, covering for Dr. Maple Hudson.   BRIEF HISTORY:  Aeon is a 45 year old male with severe AVN of both hips,  left more currently symptomatic than the right.  Now presents for a total  hip arthroplasty.   LABORATORY DATA:  Preop CBC:  Hemoglobin 13.5, hematocrit 38.8, normal white  count at 8.9.  Normal differential.  Postop hemoglobin 11.6, came up to  13.2.  Last noted H&H 12.3 and 35.9.  PT/PTT preop 12.2 and 32,  respectively.  INR 0.9.  Coumadin protocol.  Pro times followed.  Last noted  PT/INR 17.5 and 1.4.  Chem panel on admission started out with a low sodium  of 132, came up to 133, back down to 131.  Remaining BMETs remained within  normal limits.  Urinalysis preop:  Slightly elevated specific gravity of  1.035, otherwise negative.  Blood group type O+.   EKG dated November 11, 2004:  Normal sinus rhythm with sinus arrhythmia,  unconfirmed.   Left hip films show subcapital collapse of the femoral head with secondary  to degenerative changes effecting the left hip, dated November 11, 2004.   Postoperative hip and pelvis films on November 14, 2004:  Postoperative  radiograph demonstrating intact left hip prosthesis without radiographic  evidence of hardware complication.   HOSPITAL COURSE:  Patient admitted to [facility] , taken to the  OR, and underwent the above procedure.  Tolerated well.  A pulmonary consult  was called in to Dr. Fannie Knee.  Patient was evaluated by Dr. Sandrea Hughs  postoperatively.  Dr. Sherene Sires was called in the postoperative period due to  postop hypoxemia. He was status post spinal anesthetic for the hip  procedure.  Noted to have sleep apnea but had not been using his machine.  It was felt that he had oxygen-dependent respiratory  failure postop.  He was  maintained on continuous pulse ox.  His pulmonary meds were continued.  Recommend minimizing any narcotics.   By day #1, he had some pain in the left thigh in the area of the surgery.  The level of pain was expected due to the fact that he was on amounts of  narcotics preoperatively, which he understood was going to make it a little  harder to control in the postoperative period.  He was followed by pulmonary  on day #1.  Recommended continuing his bronchodilator management.  Recommended smoking cessation counseling to help with stopping smoking.  Rehab consult was called.  Patient was seen by rehab services and felt the  patient may benefit from a brief rehab stay.   By day #2, his pain was better.  He was switched over to Valium for spasms,  which did help.  His dressing was changed, and his incision looked good.  His IV's were discontinued.  Felt to be stable from a pulmonary standpoint;   therefore, pulmonary services signed off.  He continued to improve and from  a therapy standpoint, he was up ambulating approximately 100 feet by day #3  and 400 feet that afternoon.  He continued to improve.  By day #4, he was  stable from a pulmonary standpoint, doing very well with physical therapy.  Pain under good control.  He was discharged home.   DISCHARGE PLAN:  1.  Patient was discharged home on September 15 , 2006.  2.  Discharge diagnoses:  Please see above.  3.  Discharge meds:  OxyContin, Percocet, Valium, Coumadin.  4.  Diet:  As tolerated.  5.  Follow up two weeks from surgery.  Call the office for an appointment at      [phone].  6.  Activity:  Partial weightbearing 25-50%, left lower extremity, total hip      protocol.  Hip precautions.  May start showering.   DISPOSITION:  Home.   CONDITION ON DISCHARGE:  Improved.      Alexzandrew L. Julien Girt, P.A.      Ollen Gross, M.D.  Electronically Signed    ALP/MEDQ  D:  12/29/2004  T:  12/29/2004  Job:  [id number]   cc:   Joni Fears D. Maple Hudson, M.D.  [facility]  [address]  [place]  [place] [address]   Charlaine Dalton. Sherene Sires, M.D. [facility]  [address]  [place]  [place] [address]   Madelin Rear. Sherwood Gambler, MD  Fax: [phone]

## 2010-07-22 NOTE — H&P (Signed)
[facility]  Patient:    Wesley [name], Wesley [name]                     MRN: [id number] Adm. Date:  [id number] Attending:  Thyra Breed CC:         Loran Senters, M.D.   History and Physical  FOLLOWUP EVALUATION:  Wesley [name] comes in for followup evaluation of his chronic low back pain on the basis of lumbar spondylosis.  Since his last evaluation, he was doing very well on 20 mg twice a day of OxyContin but developed a flare-up of his COPD which resulted in considerable exacerbations of his lower back discomfort to the point that he has just not noticed any improvement overall and rates his pain at 9/10.  He has pain that radiates from his lower back out into the left lower extremity.  It is made worse by his coughing.  He continues on the OxyContin 20 mg twice a day, Paxil 20 mg per day, Singulair, Xanax, Albuterol nebulizer and Serevent with Flovent.  He saw Dr. Loran Senters, who wanted to call me, but he insisted on staying on his current medical regimen and would not let Dr. Gerda Diss call.  PHYSICAL EXAMINATION:  VITAL SIGNS:  Blood pressure 121/48, heart rate is 90, respiratory rate is 18, O2 saturation is 97%, pain level is 9/10 and temperature is 97.6.  NEUROMUSCULAR:  He demonstrates deep tendon reflexes to be symmetric in the lower extremities with downgoing toes.  He has increased pain on extension of his back to just a mild extent.  IMPRESSION: 1. Lumbar spondylosis with recent exacerbation secondary to coughing. 2. Recent exacerbation of underlying chronic obstructive pulmonary disease. 3. Anxiety disorder.  DISPOSITION: 1. Increase OxyContin to 20 mg 1 p.o. q.8h., #90 with no refill. 2. Resume Percocet 5/325 mg 1 to 2 p.o. q.6h. p.r.n., #120. 3. Follow up with me in four weeks. 4. The patient was encouraged to call in about 7 to 10 days to make sure he is    doing better.  He also was encouraged to allow Dr. Gerda Diss and myself to    work  together if we needed to, especially when he has an exacerbation.  I    advised him that we ought to trust each other and if I do not trust him as    much as I trust Dr. Gerda Diss, then it will compromise his care. DD:  08/01/00 TD:  08/01/00 Job: [id number]

## 2010-07-22 NOTE — Group Therapy Note (Signed)
[facility]  Patient:    Wesley [name], Wesley [name] Visit Number: [id number] MRN: [id number]          Service Type: MED Location: 3A A308 01 Attending Physician:  Harlow Asa Dictated by:   Lilyan Punt, M.D. Proc. Date: 04/17/01 Admit Date:  04/16/2001                               Progress Note  SUBJECTIVE:  The patient overall states he is doing fairly well.  Denies any specific trouble other than having mild pain and discomfort.  Denies vomiting, diarrhea.  OBJECTIVE:  Lungs: Bilateral expiratory wheezes.  Not in respiratory distress. Heart is regular.  HEENT: Benign.   ______ .  Reactive airway.  ASSESSMENT/PLAN: 1. Continue with current measures. 2. Chronic pain.  Will review his current measures and see if any changes    need to be made. Dictated by:   Lilyan Punt, M.D. Attending Physician:  Harlow Asa DD:  04/17/01 TD:  04/17/01 Job: [id number]

## 2010-07-22 NOTE — Discharge Summary (Signed)
[facility]  Patient:    Wesley [name], Wesley [name] Visit Number: [id number] MRN: [id number]          Service Type: MED Location: 3A A308 01 Attending Physician:  Harlow Asa Dictated by:   Donna Bernard, M.D. Admit Date:  04/16/2001 Discharge Date: 04/18/2001                             Discharge Summary  FINAL DIAGNOSES: 1. Exacerbation of asthma with failure on outpatient therapy. 2. Chronic obstructive pulmonary disease. 3. Chronic back pain.  FINAL DISPOSITION:  The patient discharged to home.  DISCHARGE MEDICATIONS 1. Levaquin 500 mg daily for 7 days. 2. Prednisone in tapered dose. 3. Ventolin nebulizer treatments q.4h. for the next 3 days, then as needed    after that. 4. Ambien 10 mg q.h.s. p.r.n. or sleep. 5. Maintain all other chronic medicines as per Dr. Vear Clock. 6. Follow up in the office as needed.  Initial H&P, please see H&P as dictated.  HOSPITAL COURSE:  This patient is a 45 year old white male with a history of COPD and asthma who was admitted to the hospital with exacerbation of asthma and failure on outpatient therapy.  The patient was started on IV steroids, antibiotics, frequent nebulizer treatments.  Chest x-ray showed no true pneumonia; however, chest exam revealed right basilar crackles the day before admission and the day of admission.  The patient had very significant wheezing the first day accompanied by tachypnea.  Over the next 36 to 48 hours he responded nicely to the aggressive therapy.  On the day of discharge his wheezing was present, but felt to be stable enough for discharge home.  The patient is discharged home with the diagnosis and disposition as noted above. Dictated by:   Donna Bernard, M.D. Attending Physician:  Harlow Asa DD:  05/01/01 TD:  05/01/01 Job: [id number]

## 2010-07-22 NOTE — Group Therapy Note (Signed)
NAMEWEAVER, TWEED NO.:  [id number]   MEDICAL RECORD NO.:  [id number]          PATIENT TYPE:  INP   LOCATION:  A210                          FACILITY:  [facility]   PHYSICIAN:  Edward L. [name], M.D.DATE OF BIRTH:  [birth year]   DATE OF PROCEDURE:  10/21/2004  DATE OF DISCHARGE:                                   PROGRESS NOTE   Wesley [name] patient of [facility].   SUBJECTIVE:  Wesley [name] is about the same. He has no new complaints. His  medications are being altered and adjusted. He says he is breathing better,  and indeed he does look better. As mentioned, he has no new complaints.   PHYSICAL EXAMINATION:  Physical examination today shows his temperature is  97.8, pulse is 94, respirations 18, blood sugar 113. His blood pressure  123/90, O2 sat 95% on room air. His chest is much clearer.   ASSESSMENT:  Overall he seems better.   PLAN:  Continue his medications and treatments.  I will be out-of-town over  the weekend.  I will return on Monday the 21st, and if he is still in the  hospital, of course I will see him then.      Edward L. [name], M.D.  Electronically Signed     ELH/MEDQ  D:  10/21/2004  T:  10/21/2004  Job:  [id number]
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
## 2010-07-22 NOTE — Consult Note (Signed)
NAMEAYIDEN, MILLIMAN NO.:  [id number]   MEDICAL RECORD NO.:  [id number]          PATIENT TYPE:  INP   LOCATION:  IC09                          FACILITY:  [facility]   PHYSICIAN:  Wesley [name], M.D.    DATE OF BIRTH:  12-25-1965   DATE OF CONSULTATION:  10/29/2004  DATE OF DISCHARGE:                                   CONSULTATION   GASTROENTEROLOGY CONSULTATION:   DATE OF CONSULTATION:  October 29, 2004   REASON FOR CONSULTATION:  Elevated transaminases.   HISTORY OF PRESENT ILLNESS:  Wesley [name] is 45 year old Caucasian male who was  admitted to Dr. Sharyon [name] service on October 25, 2004 for pain control.  He has  bilateral aseptic necrosis of both femoral heads.  He was scheduled for  surgery earlier this year but had to be postponed because of his breathing  problems.  Surgery on one hip is planned for next month and the other hip  six weeks from that point.  He has been on oral narcotic therapy but it was  not controlling his pain.  He was therefore admitted.  On October 26, 2004,  he had AST of 83 and ALT of 299.  He has not been complaining of abdominal  pain.  He had upper abdominal ultrasound which was normal except for mild  fatty liver; his bile duct measured 2-3 mm.  Viral marker studies have been  obtained and these are pending.  His LFTs were repeated yesterday and his  AST was down to 144 and SGPT down to 199.  Patient had an episode of nausea,  vomiting and confusion one day after hospitalization.  This was felt to be a  reaction due to Ambien.  He began to have vomiting sometime during the  night.  Today he has also been complaining of headache.  He had a  temperature of 103.7 this morning.  Dr. Sherwood [name] obtained blood cultures and  started him on Levaquin.  Patient denies abdominal pain.  There is no  history of jaundice or hepatitis in the past.  Similarly, there is no  history of tattoos or transfusion.  Most of the history was obtained from  his wife as he is  not feeling well and sleepy.   REVIEW OF SYSTEMS:  Review of the systems is negative for hematemesis,  melena or rectal bleeding.  He also denies dysuria or hematuria.   MEDICATIONS:  At home he has been on OxyContin 80 mg t.i.d., Percocet 10/325  one q.4 h. p.r.n. for breakthrough pain, Advair Diskus 250/50 one puff  b.i.d., Xanax 0.5 mg q.i.d. p.r.n., albuterol neb p.r.n. and O2 p.r.n.   He is presently on Levaquin 500 mg IV q.24 h., OxyContin 80 mg t.i.d.,  Advair 250/50 one puff b.i.d., nicotine patch 21 mg daily, Tylox one p.o.  q.4 h. p.r.n. for breakthrough pain, Dilaudid 2 mg q.1 h.  On top of that he  is on PCA pump which he is not using much according to his nurse.  Magic  mouthwash q.i.d. and Xopenex neb q.6 h.   He is also on [facility] standing  orders.   PAST MEDICAL HISTORY:  Chronic low back pain secondary to degenerative  disease of lumbar spine.  His chart indicates that he has had surgery but  patient denies that and his wife states that she is not aware that he has  had surgery.  He has had bilateral aseptic hip necrosis, COPD and anxiety.   ALLERGIES:  NK.   FAMILY HISTORY:  Mother has cirrhosis possibly secondary to NASH.  Five  siblings died during infancy or young age and he has one sister living who  is in good health.   SOCIAL HISTORY:  He is married.  He has adopted two of his wife's children.  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  He does not drink alcohol.  He has been smoking up to two packs a  day, which he has done for over 20 years, and now trying to quit.   PHYSICAL EXAMINATION:  GENERAL:  Well-developed, well-nourished Caucasian  male who is drowsy but seemed to respond appropriately to questions.  VITAL SIGNS:  He is 175 pounds, he is 73 inches tall, pulse 118 per minute,  blood pressure 137/77, respiratory rate is 16 and temperature is 100.5.  HEENT:  Conjunctivae pink.  Sclerae nonicteric.   Oropharyngeal mucosa is  dry.  NECK:  Neck is supple; no thyromegaly or lymphadenopathy noted.  No neck  masses noted.  CHEST:  Lungs are clear to auscultation.  Cardiac exam with regular rhythm,  normal S1 and S2.  No murmur or gallop noted.  ABDOMEN:  Abdomen is symmetrical, bowel sounds are normal, on palpation is  soft and nontender without masses.  Spleen is not palpable.  Liver edge is  indistinct below RCM and span is 13-14 cm.  Rectal exam is deferred.  No  peripheral edema or clubbing noted.   LABORATORIES ON ADMISSION:  WBC is 12.8, H&H 13.9 and 40.7, platelet count  270K.  Sodium was 138, potassium 4.1, chloride 96, CO2 38, glucose 103, BUN  18, creatinine 1.1, calcium was 8.6.  LFTs on October 26, 2004, bilirubin  0.3, AP 84, AST 83, ALT 299, total protein 6.4 with albumin of 3.6.  LFTs  from yesterday, total bilirubin 0.4, AP 74, AST 44, ALT 199, total protein  5.9 with albumin of 3.5.  His amylase was 61 and lipase was 21.  Serum iron  154, TIBC 332 and his saturation was 46%.   Viral marker studies are pending.   Ultrasound revealed findings as above.   ASSESSMENT:  Wesley [name] is 45 year old Caucasian male who was admitted for pain  control.  He has bilateral aseptic hip necrosis and waiting for surgery in  the next few weeks.  On admission he was noted to have elevated AST and ALT.  ALT was about six-folds elevated and AST was two-folds elevated.  Yesterday  these were down to an AST of 44 which is just above upper limit of normal  and ALT of five-folds.  His AST three days ago was 83 and ALT was 299 and  yesterday was 44 and 199, respectively.  Ultrasound reveals fatty liver but  no evidence of cholelithiasis or dilated bile duct.  I suspect his elevated  AST and ALT may be drug induced.  Levels can fluctuate with chronic  hepatitis C although it is unusual to see such a discrepancy in their levels.  Viral marker studies are pending and should be back within a day or  two.  He has also experienced nausea, vomiting and fever.  He does not have  nuchal rigidity.  I do not feel that his fever, nausea and vomiting are  secondary to a gastrointestinal illness.  Patients can experience fever with  drug-induced hepatitis but I do not believe it would explain his headache.  He has been cultured and apparently begun on Levaquin.  With his headache,  one has to worry about meningitis or encephalitis.   RECOMMENDATIONS:  LFTs will be repeated today along with amylase and lipase.  If there is increase or bump I would be worried about cholangitis, etc., but  the likelihood is quite low.   We will wait for results of viral marker studies before further evaluation  considered.  However, if transaminases continue to drop we may just want to  monitor him for the time being.   We would like to thank Dr. Sherwood [name] for the opportunity to participate in the  care of this gentleman.      Wesley [name], M.D.  Electronically Signed     NR/MEDQ  D:  10/29/2004  T:  10/30/2004  Job:  [id number]

## 2010-07-22 NOTE — Cardiovascular Report (Signed)
[place]  Patient:    Wesley [name], Wesley [name]                     MRN: [id number] Proc. Date: 07/06/99 Adm. Date:  [id number] Disc. Date: [id number] Attending:  Junious Silk CC:         Lilyan Punt, M.D.             Madolyn Frieze Jens Som, M.D. [facility]             Cardiac Catheterization Laboratory                        Cardiac Catheterization  PROCEDURES PERFORMED:  Left heart catheterization with coronary angiography and left ventriculography.  INDICATIONS:  [name] is a 45 year old male who was admitted with chest pain.  A stress Cardiolite scan was interpreted as revealing inferior septal infarct with ischemia.  He was thus referred for cardiac catheterization.  DESCRIPTION OF PROCEDURE:  A 6-French sheath was placed in the right femoral artery.  Standard Judkins 6-French catheters were utilized.  Contrast was Omnipaque.  At the conclusion of the procedure, a Perclose vascular closure device was placed in the right femoral artery with good hemostasis.  There were no complications.  RESULTS:  HEMODYNAMICS:  Left ventricular pressure 102/14, aortic pressure 94/66.  No aortic valve gradient.  LEFT VENTRICULOGRAM:  Wall motion is normal.  Ejection fraction estimated at 60%.  No mitral regurgitation.  CORONARY ARTERIOGRAPHY (Right dominant): 1. Left main:  Normal.  2. Left anterior descending artery:  The LAD gives rise to two small diagonal    branches.  The LAD is free of angiographic disease.  3. Left circumflex:  The left circumflex gives rise to a small ramus    intermedius, normal sized OM-1, and a normal sized OM-2.  The circumflex is    free of angiographic disease.  4. Right coronary artery:  The right coronary artery gives rise to a normal    sized posterior descending artery and two small posterolateral branches.    The right coronary artery is free of angiographic disease.  IMPRESSIONS: 1. Normal left ventricular  systolic function. 2. Angiographically normal coronary arteries. DD:  07/06/99 TD:  07/08/99 Job: [id number]

## 2010-07-22 NOTE — Consult Note (Signed)
NAME:  JOAOPEDRO, ESCHBACH                        ACCOUNT NO.:  [id number]   MEDICAL RECORD NO.:  [id number]                   PATIENT TYPE:  INP   LOCATION:  A326                                 FACILITY:  [facility]   PHYSICIAN:  Edward L. [name], M.D.             DATE OF BIRTH:  28-Jan-1966   DATE OF CONSULTATION:  DATE OF DISCHARGE:                                   CONSULTATION   PROBLEMS:  Chronic obstructive pulmonary disease.   HISTORY OF PRESENT ILLNESS:  Mr. Barge is a 45 year old who has been in  his usual state of poor health until he got more short of breath in the last  2-3 days.  He has a long known history of what is COPD/emphysema and,  although he has been able to continue working, he has pretty severe disease.  He got much worse in the few days prior to admission and was brought in with  cough, congestion, shortness of breath and having to take his nebulizer  treatments much more frequently.  He has since been on medications in the  hospital which he says have helped to some extent but have not cleared his  symptoms.   PAST MEDICAL HISTORY:  1. Sleep apnea for which he uses a CPAP machine at home.  2. Chronic, pretty severe, anxiety.  3. Depression.  4. Chronic pain syndrome.  5. Otherwise, he has had a cardiac catheterization which was normal about 4-     5 years ago.   FAMILY HISTORY:  Positive for lung disease, he says, in several family  members, but it is not clear if there was such early lung disease as  mentioned in Mr. Trott' history.   HOME MEDICATIONS:  1. Advair 250/50 one puff daily, but it should be b.i.d.  2. Paxil 40 mg daily.  3. OxyContin 80 mg b.i.d.  4. Baby aspirin 81 mg daily.  5. Percocet as needed.  6. Xanax 1 mg q.h.s.  7. Ventolin nebulizer treatment as needed.  Parenthetically, he does     complain that the Ventolin treatments make him very nervous.   SOCIAL HISTORY:  He is a Electrical engineer, and he is  still able to work.  He has talked to Dr. Gerda Diss and said that he had  stopped smoking, but when I talked to him, he says that he still smokes  about a half pack of cigarettes daily, but he does not inhale.   REVIEW OF SYSTEMS:  Negative.   PHYSICAL EXAMINATION:  GENERAL APPEARANCE:  Well-developed, drowsy appearing  male who is in no acute distress now.  VITAL SIGNS:  Blood pressure 110/70, pulse 90, respirations 18, afebrile.  He is awake but drowsy.  HEENT:  Nose and throat are clear.  NECK:  Supple without masses.  CHEST:  Wheezes bilaterally.  Dr. Gerda Diss says he wheezes, even when he is  doing pretty well.  HEART:  Normal.  ABDOMEN:  Soft.  EXTREMITIES:  No edema.  CNS:  Grossly intact except for his lethargy.   LABORATORY DATA:  PO2 in the 60's, PCO2 in the 50's with normal pH.  Hemoglobin level was 15 which would suggest that he is not significantly  hypoxemic at home.  Chest x-ray shows evidence of severe COPD.   IMPRESSION:  He has what looks like severe chronic obstructive pulmonary  disease on maximum medical treatment.  At this point, I think he should have  an alkaline antitrypsin level if it has not been done.  I did not order an  alkaline antitrypsin level because I was not certain if it has been done,  and I did not want to order the test if it has already been checked.  I  think he should continue his treatments.  He might get some help from the  new anticholinergic agent Spiriva, and that would certainly be a possibility  for when he goes home.  He is already on Advair which I think is  appropriate.  I discussed this with the patient and his family, and do not  plan any other interventions at this time.   Thank you for allowing me to see him with you.      ___________________________________________                                            Oneal Deputy. [name], M.D.   ELH/MEDQ  D:  05/18/2003  T:  05/19/2003  Job:  [id number]

## 2010-07-22 NOTE — Op Note (Signed)
NAMEAUGUSTINE, LEVERETTE NO.:  [id number]   MEDICAL RECORD NO.:  [id number]          PATIENT TYPE:  INP   LOCATION:  0010                         FACILITY:  [facility]   PHYSICIAN:  Ollen Gross, M.D.    DATE OF BIRTH:  1966/01/24   DATE OF PROCEDURE:  11/14/2004  DATE OF DISCHARGE:                                 OPERATIVE REPORT   PREOPERATIVE DIAGNOSIS:  Avascular necrosis, left hip.   POSTOPERATIVE DIAGNOSIS:  Avascular necrosis, left hip.   PROCEDURE:  Left total hip arthroplasty.   SURGEON:  Ollen Gross, M.D.   ASSISTANT:  Avel Peace, PA-C.   ANESTHESIA:  Spinal.   ESTIMATED BLOOD LOSS:  400 cc.   DRAINS:  Hemovac x1.   COMPLICATIONS:  None.   CONDITION:  Stable to the recovery room.   CLINICAL NOTE:  Wesley [name] is a 45 year old male with severe avascular necrosis  of both hips, left currently more symptomatic than the right.  He presents  now for left total hip arthroplasty.   PROCEDURE IN DETAIL:  After successful administration of spinal anesthetic,  the patient was placed in the right lateral decubitus position with the left  side up and held with a hip positioner.  The left lower extremity was  isolated from his perineum with plastic drapes and prepped and draped in the  usual sterile fashion.  A standard posterolateral incision was made with a  10 blade through the subcutaneous tissue to the level of the fascia lata,  which was incised in line with the skin incision.  The sciatic nerve was  palpated and protected.  A short rotator was isolated off the femur.  A  capsulectomy was performed, and the hip was dislocated.  There was a  tremendous amount of fluid present in the hip at the time of capsulectomy.  The center of the femoral head is marked, and a trial prosthesis is placed  such that the center of the trial head corresponds to the center of his  native femoral head.  An osteotomy line is marked in the femoral neck, and  an osteotomy made  with an oscillating saw.  The femur is then retracted  anteriorly to gain acetabular exposure.   The labrum is then removed.  Acetabular reaming starts at 47, coursing  increments of 2 up to 55 and a 56 mm Pinnacle acetabular shell is placed in  anatomic position and transfixed with two dome screws.  A trial 36 mm  neutral liner is placed.   The femur is prepared with the canal finder, then irrigation.  Axial reaming  is performed to 15.5 mm, proximal reaming to a 20D and the sleeve machine to  a small.  A 20D small trial sleeve is placed with a 20 x 15 stem and a 36+12  neck.  The trial 36+0 head is placed.  This reduced a little too easily, so  went to a 36+6 trial.  Reduction is performed for outstanding stability.  Full extension and full external rotation, 70 degrees flexion, 40 degrees  adduction, 90 degrees internal rotation, then 90 degrees of flexion  and 70  degrees of internal rotation.  By placing the left leg on top of the right,  the leg lengths are found to be equal.  The hip is then dislocated, and all  trials are removed.  The permanent apex hole eliminator is placed into the  acetabular shell, then a permanent 36 mm neutral Ultramet metal liner is  placed.  This is a metal-on-metal hip replacement.  The permanent 20D small  sleeve is placed with the 20 x 15 stem and a 36+12 neck.  Its native version  was neutral, so we were able 10 degrees beyond native version.  The 36+6  head is placed, and the head is reduced at the same stability parameters.  The wound is copiously irrigated with saline solution, and the short  rotators were reattached at the femur through drill holes.  The fascia lata  is closed over a Hemovac drain with interrupted #1 Vicryl, and the subcu  closed with #1 and 2-0 Vicryl, and subcuticular running 4-0 Monocryl.  The  incision is cleaned and dried, and Steri-Strips and a bulky sterile dressing  applied.  He is then awakened and transported to recovery  in stable  condition.      Ollen Gross, M.D.  Electronically Signed     FA/MEDQ  D:  11/14/2004  T:  11/14/2004  Job:  [id number]

## 2010-07-22 NOTE — Discharge Summary (Signed)
[place]  Patient:    Wesley [name], Wesley [name]                     MRN: [id number] Adm. Date:  [id number] Disc. Date: 07/07/99 Wesley [name]:  Wesley Silk [name]:   Wesley [name], P.A.C. CC:         Wesley [name], M.D., Wesley [name], Wesley [name]                  Referring Physician Discharge [place]  DATE OF BIRTH:  December 16, 1965.  ADMITTING PHYSICIAN:  Wesley [name]. Wesley [name], M.D.  SUMMARY OF HISTORY:  Wesley [name] is a 45 year old white male who was seen in the emergency room at [facility] for evaluation for chest discomfort.  He stated that he was driving down the road and his body got numb.  He developed left-sided chest discomfort radiating to the left arm associated with nausea and diaphoresis. He described it as a pressure lasting about 10 minutes and has had four episodes. He has not had any changes in the discomfort with inspiration or cough and he felt as if he were going to pass out.  He went to fire station and he was brought here o the emergency room.  His history is notable for a half a pack to a pack of cigarettes per day for 20+ years.  He has had back problems and surgery -- bilateral lower extremity ______ as a child with surgical reattachment.  LABORATORY AND X-RAY FINDINGS:  Fasting lipids showed a cholesterol of 164, triglycerides 260, HDL 33, LDL 79 with a ratio of 5.0.  CKs and troponins were negative for myocardial infarction.  Sodium 138, potassium 4.0, BUN 15, creatinine 0.8, glucose 114.  LFTs within normal limits.  Hemoglobin 16.4 and hematocrit 46.6, normal indices, platelets 226, WBC 8.8.  PT 12.8, PTT 32.  Chest x-ray showed bullous emphysema without acute abnormality.  EKG showed normal sinus rhythm.  HOSPITAL COURSE:  Wesley [name] was admitted to the Unit 2000.  Overnight, he did not have any further chest discomfort.  Enzymes and EKGs were negative for myocardial infarction.  Stress Cardiolite was performed on Jul 05, 1999.  He achieved greater than his 85% predictated maximum heart rate and walked on the treadmill over 10  minutes without any EKG changes.  Cardiolite imaging showed an EF of 52%. Findings were concerning for inferoseptal infarction and questionable inferoseptal ischemia; these findings were subtle.  After review, it was felt that he should undergo cardiac catheterization and this was performed on Jul 06, 1999.  Circumflex of 60%. Coronary arteries all were within normal limits.  He was closed with Perclose.  Post bedrest, he was ambulating without difficulty.  Catheterization site was intact.  He was complaining of hurting all over; however, Dr. Lewayne Bunting felt that he could be discharged home with a possible GI outpatient evaluation.  It was also noted that prior to discharge, patient was requesting Percocet as well as Valium. Dr. Andee Lineman did provide him with a one-week supply without refills.  DISCHARGE DIAGNOSES: 1. Noncardiac chest discomfort. 2. Chronic obstructive pulmonary disease. 3. Tobacco use.  DISPOSITION:  He is discharged home.  DISCHARGE MEDICATIONS:  He was asked to continue his inhalers as he was previously taking.  He did receive a prescription for Valium 2.5 mg b.i.d. as needed; he received eight tablets without refills.  Percocet one tab every four to six hours; he received 20 tablets without refills.  ACTIVITY:  He was advised no lifting, driving, sexual activity or heavy exertion for two days.  DIET:  Maintain a low-salt/-fat/-cholesterol diet.  SPECIAL INSTRUCTIONS:  If he had any problems with his catheterization site, he was asked to call immediately.  He was advised to discontinue smoking and avoid tobacco products and to arrange a followup appointment with Dr. Lilyan [name].  DD: 07/07/99 TD:  07/07/99 Job: [id number]

## 2010-07-22 NOTE — Discharge Summary (Signed)
NAMETALON, REGALA NO.:  [id number]   MEDICAL RECORD NO.:  [id number]          PATIENT TYPE:  INP   LOCATION:  A210                          FACILITY:  [facility]   PHYSICIAN:  Corrie Mckusick, M.D.  DATE OF BIRTH:  03-02-1966   DATE OF ADMISSION:  10/18/2004  DATE OF DISCHARGE:  08/20/2006LH                                 DISCHARGE SUMMARY   For history of present illness and past medical history, please see  admission H&P.   HOSPITAL COURSE:  A 45 year old heavy smoker presented to the office with  severe COPD exacerbation. He is admitted for _________________ close  monitoring. He was covered with both Rocephin and azithromycin. Blood  cultures were obtained. CT was also obtained on admission. Pulmonary was  consulted. Dr. Romeo Apple was also consulted as he had been dealing with  avascular necrosis of the hips. Please see Dr. [name]' note on admission  for his initial consult.   The day after admission, the patient had low back pain but was much less  short of breath. From a COPD standpoint, he was improving. Nebulizers were  continued. IV antibiotics were continued as well. Pain medicines were  increased. Elavil was attempted for sleep. IV fluids were discontinued. The  following day, the patient did continue to improve from a pulmonary  standpoint, and all medicines were continued, and Dr. [name] had actually  discussed with the patient about lung reduction surgery down the road.   Dr. Regino Schultze saw the patient on August 18 and 19 and added p.o. Dilaudid and  increased this. He worked toward discharge on August 20 as I was out of town  those remaining days. He had improved quite a bit and was ready for  discharge on August 20. Please see Dr. Edison Simon note on day of discharge  for discharge physical. One note, the CT of his chest on admission did show  quite a bit of bullous emphysema with one bulla quite large at the right  upper lobe. This is why he needs  to consider reduction surgery down the  road. Again, discharge condition improved and stable.   DISCHARGE MEDICATIONS:  1.  Nebulizers q.i.d.  2.  Advair 250/50 b.i.d.  3.  Continue his OxyContin that he was taking at home.  4.  Duragesic 100 mg every 3 days.  5.  Dilaudid 4 mg 1 to 2 every 4 to 6 hours p.r.n.  6.  Prednisone taper.   He is to follow up with Dr. Regino Schultze at [place] in one week.      Corrie Mckusick, M.D.  Electronically Signed     JCG/MEDQ  D:  11/10/2004  T:  11/10/2004  Job:  [id number]

## 2010-07-22 NOTE — Op Note (Signed)
[facility]  Patient:    Wesley [name], Wesley [name]                     MRN: [id number] Proc. Date: 08/29/00 Adm. Date:  [id number] Attending:  Thyra Breed CC:         Loran Senters, M.D.   Operative Report  HISTORY:  [name] comes in for follow up evaluation of his low back on the basis of lumbar spondylosis.  Since his previous evaluation, the patient has adjusted his medications on his own such that he is currently taking his OxyContin four tablets a day with Percocet.  He notes that this does help to a significant extent.  He is not sedated by this combination.  He takes his medications at 5 a.m., 12 p.m., and at 6 p.m. and then again at 12 midnight. I advised him that taking OxyContin four times a day was not advisable and we would need to adjust this.  He continues with his Xanax p.r.n. and his nebulizers in the form of Serevent and Flovent.  He is also taking Paxil.  He describes no new neurologic symptoms.  In fact, he feels better overall. He rates his pain at 4/10.  EXAMINATION:  VITAL SIGNS:  Blood pressure 106/52, heart rate 80, respiratory 18, O2 saturations 98%.  Pain level is 4/10.  Deep tendon reflexes were symmetric in the lower extremities with downgoing toes.  Straight leg raise signs are negative.  IMPRESSION: 1. Lumbar spondylosis - stable. 2. Other medical problems per Dr. Loran Senters.  DISPOSITION: 1. Increase OxyContin to 20 mg two in the morning, one at midday, and two in    the evening #150 with no refill. 2. Continue with Percocet 5/325 one p.o. q.6h. p.r.n. breakthrough pain #100    with no refill. 3. Follow-up with me in four weeks. DD:  08/29/00 TD:  08/29/00 Job: [id number]

## 2010-07-22 NOTE — H&P (Signed)
NAME:  Wesley [name], DIROCCO                        ACCOUNT NO.:  [id number]   MEDICAL RECORD NO.:  [id number]                   PATIENT TYPE:  INP   LOCATION:  A326                                 FACILITY:  [facility]   PHYSICIAN:  Donna Bernard, M.D.             DATE OF BIRTH:  March 18, 1965   DATE OF ADMISSION:  05/12/2003  DATE OF DISCHARGE:                                HISTORY & PHYSICAL   CHIEF COMPLAINT:  Cannot breath.   SUBJECTIVE:  This patient is a 45 year old white male with a history of COPD  and asthma along with chronic anxiety and chronic back pain and sleep apnea  who presented to the office the day of admission with complaints of severe  shortness of breath.  The patient states over the prior several days he had  significant cough and wheezing has developed.  He noticed no particular  fever.  The patient had no significant chest pain.  He had ongoing back pain  as per usual.  The night before admission the patient was up all night  taking breathing treatments as frequent as every 2 hours.  This aggravated  his baseline anxiety.  He presented to the office feeling that he was in  deep trouble with his breathing.  The patient claims compliance with his  medications which include:  1. Advair 250/50 one puff daily.  2. Paxil 40 mg daily.  3. OxyContin 80 mg b.i.d.  4. Baby aspirin 81 mg daily.  5. Percocet p.r.n. sparingly from the pain specialist.  6. Xanax 1 mg q.h.s. and occasionally during the day, #45 per month.  7. Ventolin nebulizer treatments as needed.   FAMILY HISTORY:  Positive for colon cancer, diabetes, coronary artery  disease.   PRIOR SURGERIES:  Remote fractures, cardiac catheterization in 2001 which  was normal.   SOCIAL HISTORY:  The patient operates diesel equipment.  He is married.  History of smoking up until 2000.  No alcohol abuse.   REVIEW OF SYSTEMS:  Otherwise negative.   PHYSICAL EXAMINATION:  VITAL SIGNS:  Temperature 99.6, blood  pressure  100/54, respiratory rate 32 breaths per minute.  GENERAL:  The patient is alert with obvious air hunger, breathing rapidly,  and moderate malaise.  HEENT:  Normal.  NECK:  Supple.  LUNGS:  Significant bilateral wheezes, diminished air entry diffusely.  HEART:  Tachycardic, no significant murmurs.  ABDOMEN:  Soft.  EXTREMITIES:  Normal.  NEUROLOGIC:  Intact.   SIGNIFICANT LABORATORY DATA:  Blood gas on room air:  PO2 in the low 60s,  PCO2 in the low 50s, pH normal.  CBC:  White blood count 4.4, hemoglobin 15.  MET-7 pending.  Chest x-ray:  COPD changes, no obvious acute infiltrate.   IMPRESSION:  1. Severe exacerbation of chronic obstructive pulmonary disease with     hypercapnia and hypoxia.  2. Chronic back pain.  3. Chronic sleep apnea.  4. Chronic anxiety.  PLAN:  As per orders.     ___________________________________________                                         Donna Bernard, M.D.   WSL/MEDQ  D:  05/14/2003  T:  05/14/2003  Job:  [id number]

## 2010-07-22 NOTE — Discharge Summary (Signed)
Wesley [name], PROCH NO.:  [id number]   MEDICAL RECORD NO.:  [id number]          PATIENT TYPE:  INP   LOCATION:  A228                          FACILITY:  [facility]   PHYSICIAN:  Madelin Rear. Sherwood Gambler, MD  DATE OF BIRTH:  Apr 27, 1965   DATE OF ADMISSION:  10/25/2004  DATE OF DISCHARGE:  08/30/2006LH                                 DISCHARGE SUMMARY   DISCHARGE DIAGNOSES:  1.  Bilateral avascular necrosis of hips with severe pain.  2.  Gram-negative sepsis secondary to Klebsiella pneumonia, question source.  3.  Mild elevation of liver function tests, question etiology.  4.  Chronic obstructive pulmonary disease.   DISCHARGE MEDICATIONS:  1.  Continue home narcotic regimen.  2.  Levaquin 750 mg p.o. daily x10 days.   HOSPITAL COURSE:  The patient was admitted with intractable pain.  During  his admission, he received an IV Dilaudid drip for pain control as well as  continuation of his chronic long-acting opiate therapy.  He responded to  analgesia, however, developed vomiting presumed secondary to Ambien side-  effect.  He also spiked a fever, dropped his blood pressure and was found to  have positive blood cultures, although no source including a CT of the  abdomen was negative.  He responded well to parenteral antibiotics and  regained his blood pressure rapidly and had no evidence of septic arthritis,  pneumonia, urinary tract infection or other source.  He did have a headache,  which prompted some concern, although with gram-negative blood culture  positive in an immunocompetent patient, an LP was not done.  In fact, his  headache did go away with continued treatment.  He was subsequently  discharged to continue long-acting narcotics and patches as well as  anticipated surgery with arthroplasty anticipated per orthopedics in  [place].   FOLLOW UP:  He will follow up in the office for repeat of his liver function  tests.      Madelin Rear. Sherwood Gambler, MD  Electronically Signed     LJF/MEDQ  D:  11/21/2004  T:  11/21/2004  Job:  [id number]

## 2010-07-22 NOTE — Group Therapy Note (Signed)
NAMEJAKOTA, MANTHEI NO.:  [id number]   MEDICAL RECORD NO.:  [id number]          PATIENT TYPE:  INP   LOCATION:  A210                          FACILITY:  [facility]   PHYSICIAN:  Edward L. [name], M.D.DATE OF BIRTH:  1965-09-25   DATE OF PROCEDURE:  10/19/2004  DATE OF DISCHARGE:                                   PROGRESS NOTE   SUBJECTIVE:  Mr. Lange seems a bit better this morning.  He has less  congestion.  He has no other new complaints.   OBJECTIVE:  VITAL SIGNS:  His exam shows his temperature is 97.2, pulse is  90, respirations 18, blood pressure 111/63, O2 saturations 94% listed on  room air.   ASSESSMENT:  He is a little clearer.  His pain still is a significant  problem and he wants to change his pain medication around and I told him to  discuss that with Dr. Phillips Odor, who is in the room.   PLAN:  I have discussed with Dr. Phillips Odor that I think he may be a candidate  for lung reduction surgery at some point, but obviously he has to stop  smoking first.  Mr. Caseres is not certain if he has ever been worked up for  alpha-I antitrypsin deficiency and with his lungs as bad as they are at an  early age, I think we should go ahead and check for that to be sure.      Edward L. [name], M.D.  Electronically Signed     ELH/MEDQ  D:  10/19/2004  T:  10/19/2004  Job:  [id number]

## 2010-07-22 NOTE — Group Therapy Note (Signed)
NAME:  Wesley [name], Wesley [name]                        ACCOUNT NO.:  [id number]   MEDICAL RECORD NO.:  [id number]                   PATIENT TYPE:  INP   LOCATION:  A326                                 FACILITY:  [facility]   PHYSICIAN:  Edward L. [name], M.D.             DATE OF BIRTH:  11-18-1965   DATE OF PROCEDURE:  05/19/2003  DATE OF DISCHARGE:                                   PROGRESS NOTE   SUMMARY:  I have discussed [name]' situation with Dr. Gerda Diss and Dr.  Gerda Diss has gone ahead and ordered alpha-1 antitrypsin level to be certain  that this has been done, which I think is appropriate.  Otherwise, I really  do not have anything else to add to the previous treatment.  My plan then  would be to go ahead at this point and sign off.  I will be glad to see him  again at your request.  Thank you again for allowing me to see him with you.      ___________________________________________                                            Oneal Deputy. [name], M.D.   ELH/MEDQ  D:  05/19/2003  T:  05/19/2003  Job:  [id number]

## 2010-07-22 NOTE — Group Therapy Note (Signed)
NAMEDEMAREON, COLDWELL NO.:  [id number]   MEDICAL RECORD NO.:  [id number]          PATIENT TYPE:  INP   LOCATION:  A210                          FACILITY:  [facility]   PHYSICIAN:  Edward L. [name], M.D.DATE OF BIRTH:  Aug 03, 1965   DATE OF PROCEDURE:  10/20/2004  DATE OF DISCHARGE:                                   PROGRESS NOTE   SUBJECTIVE:  Mr. Langenderfer says he is feeling a little better. He has no new  complaints. His alpha-1 antitrypsin level is normal, which of course is  excellent news.   OBJECTIVE:  CHEST:  His physical exam shows his chest is clear.  HEART:  His heart is regular.  ABDOMEN:  Soft.  EXTREMITIES:  Extremities showed no edema.  GENERAL:  He looks more comfortable. He is not wheezing much.   ASSESSMENT/PLAN:  Assessment is that he has chronic obstructive pulmonary  disease, severe, with large bullae and blebs. He has multiple other  problems. He is concerned about applying for disability. I have told them I  think he is clearly indeed disabled, but that is a process of application,  etc. He is at this point I would plan trying to get him to continue with his  medications and treatments. He will need to be off of cigarettes for at  least 3 months before he could even have an evaluation for the lung  reduction surgery.      Edward L. [name], M.D.  Electronically Signed     ELH/MEDQ  D:  10/20/2004  T:  10/20/2004  Job:  [id number]

## 2010-07-22 NOTE — Group Therapy Note (Signed)
NAME:  JAMINE, HIGHFILL                        ACCOUNT NO.:  [id number]   MEDICAL RECORD NO.:  [id number]                   PATIENT TYPE:  INP   LOCATION:  A326                                 FACILITY:  [facility]   PHYSICIAN:  Scott A. Gerda Diss, M.D.               DATE OF BIRTH:  09-14-1965   DATE OF PROCEDURE:  05/16/2003  DATE OF DISCHARGE:                                   PROGRESS NOTE   SUBJECTIVE:  The patient had cough, wheezing overnight.  No high fevers.  States he feels exceptionally tired, has a difficult time sleeping, feels  nervous a lot.  States his pain is under good control.   EXAMINATION:  Bilateral end-expiratory wheezes are noted, fairly tight.  Heart is regular, abdomen is soft, extremities no edema.   ASSESSMENT AND PLAN:  Asthma with severe flare of chronic obstructive  pulmonary disease.  Continue IV steroids along with frequent nebulizer  treatments.  Increase Xanax to 1 mg q.i.d.  The patient is slow to turn the  corner mainly because of his significant health problems with the COPD.      ___________________________________________                                            Jonna Coup. Gerda Diss, M.D.   SAL/MEDQ  D:  05/16/2003  T:  05/16/2003  Job:  [id number]

## 2010-07-22 NOTE — Consult Note (Signed)
NAMEVISHRUTH, SEOANE NO.:  [id number]   MEDICAL RECORD NO.:  [id number]          PATIENT TYPE:  INP   LOCATION:  5709                         FACILITY:  [facility]   PHYSICIAN:  Leonides Grills, M.D.     DATE OF BIRTH:  1966/02/24   DATE OF CONSULTATION:  07/22/2004  DATE OF DISCHARGE:  07/22/2004                                   CONSULTATION   CHIEF COMPLAINT:  Bilateral hip pain.   HISTORY:  This is a 45 year old gentleman with long-standing COPD and  emphysema who has been on chronic steroids for this.  He has subsequently  developed bilateral hip pain.  He has known AVN of both his hips, more on  the left than the right.  We were consulted for evaluation and referral for  probable surgical intervention due to the intensity of the patient's pain.   PAST MEDICAL HISTORY:  Please refer to detailed H&P on chart.   PHYSICAL EXAMINATION:  Well developed, well nourished, very pleasant  gentleman.  He has range of motion of the hip that is very good but pain on  extremes, especially with internal rotation, external rotation, and forward  flexion.  No evidence of sepsis, septic joint, obviously.  He is  neurovascularly intact.   IMPRESSION:  Known bilateral femoral head AVN.   PLAN:  I explained to Mr. Wildey that at this point we will refer him to  Dr. Lequita Halt Tuesday of this coming week, he is to call for an appointment at  [phone].  He is weight-bearing as tolerated, NSAIDs can be taken if  tolerable.      PB/MEDQ  D:  07/22/2004  T:  07/22/2004  Job:  [id number]
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
## 2010-07-22 NOTE — Discharge Summary (Signed)
NAMELADDIE, MATH NO.:  [id number]   MEDICAL RECORD NO.:  [id number]          PATIENT TYPE:  INP   LOCATION:  5709                         FACILITY:  [facility]   PHYSICIAN:  Madelin Rear. Sherwood Gambler, MD  DATE OF BIRTH:  Jun 04, 1965   DATE OF ADMISSION:  07/16/2004  DATE OF DISCHARGE:  05/19/2006LH                                 DISCHARGE SUMMARY   DISCHARGE MEDICATIONS:  Not applicable, transferred to Dr. Roxy Cedar service  at [facility] on 07/21/04.   DISCHARGE DIAGNOSES:  1.  Severe exacerbation of chronic obstructive pulmonary disease.  2.  Respiratory insufficiency.  3.  Intractable poor response to therapy.   SUMMARY:  The patient was admitted with severe respiratory difficulty and  noted to have tight bronchospasm.  He was started on the usual regimen of IV  steroids, IV bronchodilators, nebulizers, etc.  He failed to improve  adequately.  This was despite maximal therapy with Spiriva, Advair, Xopenex,  IV steroids, IV aminophylline.  He was subsequently discussed in  consultation with his pulmonologist, Dr. Maple Hudson, who agreed to accept the  case for other acute interventions as deemed necessary, and was transferred  to [facility] for higher level care.       LJF/MEDQ  D:  08/13/2004  T:  08/13/2004  Job:  [id number]

## 2010-07-22 NOTE — H&P (Signed)
NAMEROGELIO, Wesley NO.:  [id number]   MEDICAL RECORD NO.:  [id number]          PATIENT TYPE:  INP   LOCATION:  A303                          FACILITY:  [facility]   PHYSICIAN:  Madelin Rear. Sherwood Gambler, MD  DATE OF BIRTH:  06/13/1965   DATE OF ADMISSION:  10/25/2004  DATE OF DISCHARGE:  LH                                HISTORY & PHYSICAL   CHIEF COMPLAINT:  Hip pain.   HISTORY OF PRESENT ILLNESS:  The patient has severe intractable hip pain  secondary to aseptic femoral necrosis and osteoarthritis of both hips  pending a hip replacement surgery x2 dated November 12, 2004 scheduled.  He  has increasing pain in spite of a maximal outpatient regimen with a  Duragesic patch and OxyContin around the clock, as well as shorter acting  analgesics.   PAST MEDICAL HISTORY:  Pertinent for:  1.  COPD/asthma recently admitted for that.  2.  Osteoarthritis.   SOCIAL HISTORY:  He has some significant financial issues due to his ongoing  pain and disability.  He has no history of substance abuse of alcohol abuse  but positive cigarette smoking.   FAMILY HISTORY:  Noncontributory.  It positive for coronary artery disease  in his deceased father, kidney disease, maternal diabetes mellitus,  cirrhosis, and hypertension.  One brother is deceased from a motor vehicle  accident.  Two children who are in good health.   REVIEW OF SYSTEMS:  Under HPI.  Also negative.   PHYSICAL EXAMINATION:  GENERAL:  He is somewhat tearful and upset secondary  to pain.  HEAD/NECK:  No JVD or adenopathy.  Neck supple.  CHEST:  Clear.  CARDIAC:  Regular rate and rhythm without gallop or rub.  ABDOMEN:  Benign.  EXTREMITIES:  Limited range of motion secondary to pain bilateral hips.  Neurovascular exam intact.  No cyanosis, clubbing, or edema.  NEUROLOGIC:  Nonfocal.   IMPRESSION:  1.  Severe intractable pain syndrome secondary to aseptic femoral necrosis.      He will be admitted for parenteral  analgesia.  We will reconsult Dr. Lequita Halt and see if we can push up the      surgery, as the only likely definitive measure to decrease his pain.  2.  Chronic obstructive pulmonary disease stable at present.  Intervene as      necessary.      Madelin Rear. Sherwood Gambler, MD  Electronically Signed     LJF/MEDQ  D:  10/25/2004  T:  10/25/2004  Job:  [id number]

## 2010-07-22 NOTE — H&P (Signed)
NAMEYORDIN, RHODA NO.:  [id number]   MEDICAL RECORD NO.:  [id number]          PATIENT TYPE:  INP   LOCATION:  NA                           FACILITY:  [facility]   PHYSICIAN:  Ollen Gross, M.D.    DATE OF BIRTH:  [birth year]   DATE OF ADMISSION:  11/14/2004  DATE OF DISCHARGE:                                HISTORY & PHYSICAL   DATE OF OFFICE VISIT HISTORY AND PHYSICAL:  November 10, 2004.   DATE OF ADMISSION:  November 14, 2004.   CHIEF COMPLAINT:  Bilateral hip pain.   HISTORY OF PRESENT ILLNESS:  The patient is a 45 year old male who has been  seen by Dr. Lequita Halt for ongoing bilateral hip pain.  He has known bilateral  end-stage arthritis with a known AVN.  He has been treated conservatively,  but, despite conservative measures, he has had increasing pain.  It is felt  he would benefit from undergoing surgical intervention.  Both hips are  equally painful.  He has elected to proceed with the left hip.  Risks and  benefits discussed.  The patient was subsequently admitted to the hospital.   ALLERGIES:  No known drug allergies.  Intolerance to [facility] which causes  sickness.   CURRENT MEDICATIONS:  1.  Percocet.  2.  OxyContin.  3.  Advair 250/50 one puff twice a day.  4.  Albuterol nebulizers p.r.n.  5.  _______ 750 mg; at the time of the dictation, he only had 2 more days of      the antibiotic.   PAST MEDICAL HISTORY:  1.  COPD.  2.  History of pneumonia.  3.  Most recently, he had a history of septicemia with chronic      hospitalization during the month of August.  Site of infection unknown.   PAST SURGICAL HISTORY:  Negative.   FAMILY HISTORY:  Mother living, age 47, with a history of liver cirrhosis  and diabetes.  Father deceased at age 47 with heart disease and diabetes.   SOCIAL HISTORY:  Married.  Two children.  Half pack per day smoker.  No  alcohol.  Certified Scientist, research (medical).   REVIEW OF SYSTEMS:  GENERAL:   He has had some intermittent fevers over the  past 48 hours, some chills and night sweats, with the most recent  hospitalization.  He was just released from the hospital a week ago.  NEUROLOGIC:  No seizures, syncope, paralysis.  RESPIRATORY:  He has had some  intermittent vomiting and diarrhea.  No constipation.  This has been going  on for the past 48 hours.  CARDIOVASCULAR:  No chest pain, angina, or  orthopnea.  GU:  No dysuria, hematuria, or discharge.  MUSCULOSKELETAL:  Hip  as found in the history of present illness.   PHYSICAL EXAMINATION:  VITAL SIGNS:  Pulse 96, respirations 16, blood  pressure 98/60.  GENERAL:  A 45 year old, thin-framed, white male, well-nourished, well-  developed, in no acute distress.  He is alert, oriented, and cooperative.  Average historian.  HEENT:  Normocephalic and atraumatic.  Pupils equal, round and  reactive to  light.  Oropharynx clear.  Extraocular movements intact.  NECK:  Supple.  CHEST:  He does have some expiratory wheezes at the left mid base and also  in the right mid pulmonary field.  They do improve a little bit with  coughing, but are still present.  No other adventitious sounds are  appreciated.  HEART:  Regular rate and rhythm.  No murmur.  ABDOMEN:  Soft, flat, nontender.  Bowel sounds present.  RECTAL/BREAST/GENITALIA:  Not done.  No pertinent to present illness.  EXTREMITIES:  Right hip - motor function is intact of the right hip.  He  does ambulate with an antalgic gait.  Sensation is intact.  Left hip - motor  function is intact.  He does ambulate with an antalgic gait.  Sensation is  intact throughout the left lower extremity.   IMPRESSION:  1.  Avascular necrosis, bilateral hips.  2.  Chronic obstructive pulmonary disease.  3.  History of pneumonia.  4.  Recent hospitalization secondary to septicemia, etiology unknown.   PLAN:  The patient will be admitted to [facility] to undergo left  total hip arthroplasty.   The surgery will be performed by Dr. Ollen Gross.  We have talked to Dr. Sherwood Gambler preoperatively about his most recent  hospitalization.  His symptoms from the infection have cleared, and he has  recommended to go ahead and proceed with surgery.      Alexzandrew L. Julien Girt, P.A.      Ollen Gross, M.D.  Electronically Signed    ALP/MEDQ  D:  11/13/2004  T:  11/14/2004  Job:  [id number]   cc:   Madelin Rear. Sherwood Gambler, MD  [address]  [place]  [place] [address]  Fax: [phone]   Clinton D. Maple Hudson, M.D.  [facility]  [address]  [place]  [place] [address]

## 2010-07-22 NOTE — Discharge Summary (Signed)
NAME:  Wesley [name], Wesley [name]                        ACCOUNT NO.:  [id number]   MEDICAL RECORD NO.:  [id number]                   PATIENT TYPE:  INP   LOCATION:  A326                                 FACILITY:  [facility]   PHYSICIAN:  Donna Bernard, M.D.             DATE OF BIRTH:  [birth year]   DATE OF ADMISSION:  05/12/2003  DATE OF DISCHARGE:  05/22/2003                                 DISCHARGE SUMMARY   FINAL DIAGNOSES:  1. Exacerbation of chronic obstructive pulmonary disease.  2. Severe chronic obstructive pulmonary disease.  3. Yeast stomatitis.  4. Chronic back pain.  5. Hypoxia secondary to chronic obstructive pulmonary disease.  6. Insomnia.  7. Chronic anxiety.   FINAL DISPOSITION:  The patient discharged home.   DISCHARGE MEDICATIONS:  1. The patient is to use nasal cannula oxygen, two liters per minute.  2. Prednisone taper as directed.  3. Diflucan as directed.  4. Xanax one-half tablet three times per day.  5. Restoril 30 mg q.h.s.  6. Increase Advair to one puff b.i.d.  7. Nicotrol patch 21 mg daily.   DISCHARGE INSTRUCTIONS:  1. Stop smoking.  2. Follow up with Dr. Donna Bernard in one week.  3. Blood gas prior to that visit.   INITIAL HISTORY AND PHYSICAL:  Please see H&P as dictated.   HOSPITAL COURSE:  This patient is a 45 year old white male with a history  of chronic obstructive pulmonary disease and asthma who was admitted,  brought in the day of admission with complaints of severe shortness of  breath.  The patient was found to be very hypoxic with a pO2 in the 60's and  cO2 in the low 50's.  The patient's chest x-ray showed COPD, no obvious  infiltrate.  The patient had a fairly rocky course in the hospital.  He was  given IV steroids, IV antibiotics and nasal cannula O2, Atrovent and  albuterol nebulizer.  As per usual, the patient had ongoing trouble with  anxiety, nerves and insomnia.  In addition, he had trouble with chronic  pain.  This was  managed with Xanax, Restoril and OxyContin respectively.  We  consulted the pulmonary folks, specifically Dr. [name], to assess the  patient.  He felt the patient was receiving pretty much maximal medical  therapy.  He did recommend an Alpha anti 1 trypsin assay to make sure things  were okay there.  As it turned out, those numbers were fine.  It came back  within mid normal range.  The patient improved very slowly.  Nearing the  finish of hospitalization, it was clear that the patient has remained  hypoxic and was going to need to go home on oxygen supplement.   Also, several days prior to discharge, the patient had a significant bout  with yeast stomatitis which was treated with Magic Mouthwash and Diflucan.   DISCHARGE CONDITION:  On the day of discharge, the patient  was somewhat  stable, with still significant ongoing wheezes and hypoxia.  It was felt he  could probably manage at home.  He was discharged with the diagnosis and  disposition as above.  The patient is aware if he continues to smoke, he  will end up on chronic oxygen which a terrible thing for a fellow who is  only 45 years old.     ___________________________________________                                         Donna Bernard, M.D.   WSL/MEDQ  D:  06/10/2003  T:  06/11/2003  Job:  [id number]   cc:   Donna Bernard, M.D.  [address]  [place]  [place] [address]  Fax: [phone]

## 2010-07-22 NOTE — H&P (Signed)
Wesley [name], Wesley [name] NO.:  [id number]   MEDICAL RECORD NO.:  [id number]          PATIENT TYPE:  INP   LOCATION:                                FACILITY:  [facility]   PHYSICIAN:  Madelin Rear. Sherwood Gambler, MD  DATE OF BIRTH:  [birth year]   DATE OF ADMISSION:  07/16/2004  DATE OF DISCHARGE:  LH                                HISTORY & PHYSICAL   CHIEF COMPLAINT:  Shortness of breath and cough.   HISTORY OF PRESENT ILLNESS:  The patient has had three days to five days of  progressively increasing shortness of breath and cough.  He was forced to  increase nebulizer frequency to five or six times a day.  This resulted in  nausea and vomiting, but no abdominal pain.  He admitted to pyleomyalgia and  some low-grade fever, but no true rigors.  He has coughed up no blood.   PAST MEDICAL HISTORY:  1.  COPD.  2.  By his history, severe pulmonary function test abnormalities, although      we have no documentation of that.  3.  History of bronchospasm and asthma superimposed on COPD.  4.  Degenerative joint disease and requires long-term pain management.   PAST SURGICAL HISTORY:  Status post laminectomies x2 by Dr. Gerlene Fee.   ALLERGIES:  No known drug allergies.   SOCIAL HISTORY:  Positive for cigarette smoking.  No alcohol or other  substance abuse.  He works as a Games developer.   FAMILY HISTORY:  Noncontributory.   REVIEW OF SYSTEMS:  As under HPI, all else negative.   PHYSICAL EXAMINATION:  GENERAL:  He is obviously in distress with tachypnea.  HEAD/NECK:  No JVD or adenopathy.  Neck is supple.  CHEST:  Diffuse inspiratory and expiratory wheezing, prolonged expiratory  phase, scattered rhonchi.  CARDIAC:  Regular rate and rhythm, without murmurs, rubs, or gallops.  ABDOMEN:  Soft, no organomegaly or masses.  No guarding or rebound  tenderness.  EXTREMITIES:  Without cyanosis, clubbing, or edema.  NEUROLOGIC:  Nonfocal.   LABORATORY DATA:  Pending at present, will be  reviewed when available.   IMPRESSION:  1.  Severe chronic obstructive pulmonary disease/asthma exacerbation with      respiratory insufficiency,      superimposed acute bronchitis, probable sinusitis.  The patient will be      admitted for intravenous steroids, intravenous bronchodilators, around-      the-clock nebulizers.  Acute intervention as needed.  2.  Chronic back pain and osteoarthritis.  Continue steroids as above.      Also, p.r.n. analgesics.      LJF/MEDQ  D:  07/16/2004  T:  07/16/2004  Job:  [id number]

## 2010-07-22 NOTE — Group Therapy Note (Signed)
NAME:  Wesley [name], Wesley [name]                        ACCOUNT NO.:  [id number]   MEDICAL RECORD NO.:  [id number]                   PATIENT TYPE:  INP   LOCATION:  A326                                 FACILITY:  [facility]   PHYSICIAN:  Scott A. Gerda Diss, M.D.               DATE OF BIRTH:  1965/10/30   DATE OF PROCEDURE:  05/15/2003  DATE OF DISCHARGE:                                   PROGRESS NOTE   Patient overall states that he feels weak, run down, tired.  He denies  fever.  Is overall doing fair, a little bit better compared to when he came  in.  PHYSICAL EXAM:  GENERAL:  NAD.  LUNGS:  Bilateral expiratory wheezes are noted.  HEART:  Regular.  ABDOMEN:  Soft.  HEENT:  Had a nasal bleed on the right side due to the O2 nasal prong.   ASSESSMENT AND PLAN:  Nasal bleed--pinch for 5 minutes when it occurs.  Use  Bactroban a couple of times a day up in there, along with clipping the prong  on the nasal cannula on that side and humidifying the O2.  Continuing other  current orders.  The patient gradually getting better, but needs to stay in  the hospital.      ___________________________________________                                            Jonna Coup. Gerda Diss, M.D.   SAL/MEDQ  D:  05/15/2003  T:  05/15/2003  Job:  [id number]
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
## 2011-07-18 ENCOUNTER — Emergency Department (HOSPITAL_COMMUNITY): Payer: Medicaid Other

## 2011-07-18 ENCOUNTER — Inpatient Hospital Stay (HOSPITAL_COMMUNITY): Payer: Medicaid Other

## 2011-07-18 ENCOUNTER — Inpatient Hospital Stay (HOSPITAL_COMMUNITY)
Admission: EM | Admit: 2011-07-18 | Discharge: 2011-07-24 | DRG: 190 | Disposition: A | Discharge status: Home / Self Care | Payer: Medicaid Other | Attending: Internal Medicine | Admitting: Internal Medicine

## 2011-07-18 ENCOUNTER — Encounter (HOSPITAL_COMMUNITY): Payer: Self-pay | Admitting: *Deleted

## 2011-07-18 DIAGNOSIS — G8929 Other chronic pain: Secondary | ICD-10-CM | POA: Diagnosis present

## 2011-07-18 DIAGNOSIS — IMO0001 Reserved for inherently not codable concepts without codable children: Secondary | ICD-10-CM | POA: Diagnosis present

## 2011-07-18 DIAGNOSIS — M791 Myalgia, unspecified site: Secondary | ICD-10-CM | POA: Diagnosis present

## 2011-07-18 DIAGNOSIS — M7989 Other specified soft tissue disorders: Secondary | ICD-10-CM | POA: Diagnosis not present

## 2011-07-18 DIAGNOSIS — E860 Dehydration: Secondary | ICD-10-CM | POA: Diagnosis present

## 2011-07-18 DIAGNOSIS — F17200 Nicotine dependence, unspecified, uncomplicated: Secondary | ICD-10-CM | POA: Diagnosis present

## 2011-07-18 DIAGNOSIS — J9620 Acute and chronic respiratory failure, unspecified whether with hypoxia or hypercapnia: Secondary | ICD-10-CM

## 2011-07-18 DIAGNOSIS — Z9981 Dependence on supplemental oxygen: Secondary | ICD-10-CM

## 2011-07-18 DIAGNOSIS — J449 Chronic obstructive pulmonary disease, unspecified: Secondary | ICD-10-CM | POA: Diagnosis present

## 2011-07-18 DIAGNOSIS — J441 Chronic obstructive pulmonary disease with (acute) exacerbation: Principal | ICD-10-CM | POA: Diagnosis present

## 2011-07-18 DIAGNOSIS — G894 Chronic pain syndrome: Secondary | ICD-10-CM | POA: Diagnosis present

## 2011-07-18 DIAGNOSIS — R509 Fever, unspecified: Secondary | ICD-10-CM | POA: Diagnosis present

## 2011-07-18 DIAGNOSIS — M25559 Pain in unspecified hip: Secondary | ICD-10-CM | POA: Diagnosis present

## 2011-07-18 DIAGNOSIS — R000 Tachycardia, unspecified: Secondary | ICD-10-CM | POA: Diagnosis present

## 2011-07-18 DIAGNOSIS — D7289 Other specified disorders of white blood cells: Secondary | ICD-10-CM

## 2011-07-18 DIAGNOSIS — Z720 Tobacco use: Secondary | ICD-10-CM | POA: Diagnosis present

## 2011-07-18 DIAGNOSIS — D72829 Elevated white blood cell count, unspecified: Secondary | ICD-10-CM | POA: Diagnosis present

## 2011-07-18 DIAGNOSIS — J438 Other emphysema: Secondary | ICD-10-CM

## 2011-07-18 DIAGNOSIS — J189 Pneumonia, unspecified organism: Secondary | ICD-10-CM | POA: Diagnosis present

## 2011-07-18 HISTORY — DX: Chronic obstructive pulmonary disease, unspecified: J44.9

## 2011-07-18 LAB — CARDIAC PANEL(CRET KIN+CKTOT+MB+TROPI)
CK, MB: 2.3 ng/mL (ref 0.3–4.0)
Relative Index: INVALID (ref 0.0–2.5)
Total CK: 73 U/L (ref 7–232)
Troponin I: <0.3 ng/mL (ref <0.30)

## 2011-07-18 LAB — COMPREHENSIVE METABOLIC PANEL
ALT: 13 U/L (ref 0–53)
AST: 16 U/L (ref 0–37)
Albumin: 3.9 g/dL (ref 3.5–5.2)
Alkaline Phosphatase: 123 U/L — ABNORMAL HIGH (ref 39–117)
Chloride: 96 mEq/L (ref 96–112)
Potassium: 3.6 mEq/L (ref 3.5–5.1)
Total Bilirubin: 0.4 mg/dL (ref 0.3–1.2)

## 2011-07-18 LAB — URINALYSIS, ROUTINE W REFLEX MICROSCOPIC
Ketones, ur: NEGATIVE mg/dL
Leukocytes, UA: NEGATIVE
Nitrite: NEGATIVE
Protein, ur: NEGATIVE mg/dL
Urobilinogen, UA: 0.2 mg/dL (ref 0.0–1.0)

## 2011-07-18 LAB — MRSA PCR SCREENING: MRSA by PCR: NEGATIVE

## 2011-07-18 LAB — CBC
Hemoglobin: 15 g/dL (ref 13.0–17.0)
MCHC: 33.6 g/dL (ref 30.0–36.0)
RDW: 12.3 % (ref 11.5–15.5)
WBC: 22 10*3/uL — ABNORMAL HIGH (ref 4.0–10.5)

## 2011-07-18 LAB — DIFFERENTIAL
Basophils Absolute: 0.1 10*3/uL (ref 0.0–0.1)
Basophils Relative: 0 % (ref 0–1)
Lymphocytes Relative: 6 % — ABNORMAL LOW (ref 12–46)
Neutro Abs: 19.1 10*3/uL — ABNORMAL HIGH (ref 1.7–7.7)
Neutrophils Relative %: 87 % — ABNORMAL HIGH (ref 43–77)

## 2011-07-18 MED ORDER — SODIUM CHLORIDE 0.9 % IV SOLN: INTRAVENOUS | Status: DC

## 2011-07-18 MED ORDER — IOHEXOL 350 MG/ML SOLN
100.0000 mL | Freq: Once | INTRAVENOUS | Status: AC | PRN
  Administered 2011-07-18: 100 mL via INTRAVENOUS

## 2011-07-18 MED ORDER — METHYLPREDNISOLONE SODIUM SUCC 125 MG IJ SOLR
60.0000 mg | Freq: Two times a day (BID) | INTRAMUSCULAR | Status: DC
  Administered 2011-07-18 – 2011-07-19 (×2): 60 mg via INTRAVENOUS
  Filled 2011-07-18 (×2): qty 2

## 2011-07-18 MED ORDER — FLUTICASONE-SALMETEROL 500-50 MCG/DOSE IN AEPB
1.0000 | INHALATION_SPRAY | Freq: Two times a day (BID) | RESPIRATORY_TRACT | Status: DC
  Administered 2011-07-18 – 2011-07-24 (×12): 1 via RESPIRATORY_TRACT
  Filled 2011-07-18: qty 14

## 2011-07-18 MED ORDER — DEXTROSE 5 % IV SOLN
100.0000 mg | Freq: Once | INTRAVENOUS | Status: AC
Start: 2011-07-18 — End: 2011-07-18
  Administered 2011-07-18: 100 mg via INTRAVENOUS
  Filled 2011-07-18: qty 100

## 2011-07-18 MED ORDER — ONDANSETRON HCL 4 MG/2ML IJ SOLN
4.0000 mg | Freq: Three times a day (TID) | INTRAMUSCULAR | Status: DC | PRN
  Administered 2011-07-18: 4 mg via INTRAVENOUS
  Filled 2011-07-18: qty 2

## 2011-07-18 MED ORDER — ACETAMINOPHEN 500 MG PO TABS
1000.0000 mg | ORAL_TABLET | Freq: Once | ORAL | Status: AC
  Administered 2011-07-18: 1000 mg via ORAL
  Filled 2011-07-18: qty 2

## 2011-07-18 MED ORDER — SODIUM CHLORIDE 0.9 % IV SOLN
INTRAVENOUS | Status: DC
  Administered 2011-07-19 – 2011-07-20 (×2): via INTRAVENOUS
  Administered 2011-07-20 – 2011-07-21 (×2): 1000 mL via INTRAVENOUS
  Administered 2011-07-22: 05:00:00 via INTRAVENOUS

## 2011-07-18 MED ORDER — ACETAMINOPHEN 325 MG PO TABS
650.0000 mg | ORAL_TABLET | Freq: Four times a day (QID) | ORAL | Status: DC | PRN
  Administered 2011-07-18 – 2011-07-20 (×2): 650 mg via ORAL
  Filled 2011-07-18 (×2): qty 2

## 2011-07-18 MED ORDER — ALBUTEROL SULFATE (5 MG/ML) 0.5% IN NEBU
2.5000 mg | INHALATION_SOLUTION | RESPIRATORY_TRACT | Status: DC
  Administered 2011-07-18 – 2011-07-24 (×31): 2.5 mg via RESPIRATORY_TRACT
  Filled 2011-07-18 (×31): qty 0.5

## 2011-07-18 MED ORDER — HYDROMORPHONE HCL PF 1 MG/ML IJ SOLN
1.0000 mg | INTRAMUSCULAR | Status: DC | PRN
  Administered 2011-07-18 – 2011-07-20 (×11): 1 mg via INTRAVENOUS
  Filled 2011-07-18 (×11): qty 1

## 2011-07-18 MED ORDER — OXYCODONE HCL 5 MG PO TABS
30.0000 mg | ORAL_TABLET | Freq: Four times a day (QID) | ORAL | Status: DC | PRN
  Administered 2011-07-18 – 2011-07-19 (×4): 30 mg via ORAL
  Filled 2011-07-18 (×4): qty 6

## 2011-07-18 MED ORDER — ALBUTEROL SULFATE (5 MG/ML) 0.5% IN NEBU
5.0000 mg | INHALATION_SOLUTION | Freq: Once | RESPIRATORY_TRACT | Status: AC
  Administered 2011-07-18: 5 mg via RESPIRATORY_TRACT
  Filled 2011-07-18: qty 1

## 2011-07-18 MED ORDER — ONDANSETRON HCL 4 MG/2ML IJ SOLN: 4.0000 mg | Freq: Four times a day (QID) | INTRAMUSCULAR | Status: DC | PRN

## 2011-07-18 MED ORDER — FLUTICASONE-SALMETEROL 500-50 MCG/DOSE IN AEPB
INHALATION_SPRAY | RESPIRATORY_TRACT | Status: AC
  Filled 2011-07-18: qty 14

## 2011-07-18 MED ORDER — ALBUTEROL SULFATE (5 MG/ML) 0.5% IN NEBU: 2.5000 mg | INHALATION_SOLUTION | RESPIRATORY_TRACT | Status: DC | PRN

## 2011-07-18 MED ORDER — ENOXAPARIN SODIUM 40 MG/0.4ML ~~LOC~~ SOLN
40.0000 mg | SUBCUTANEOUS | Status: DC
  Administered 2011-07-18 – 2011-07-23 (×6): 40 mg via SUBCUTANEOUS
  Filled 2011-07-18 (×6): qty 0.4

## 2011-07-18 MED ORDER — SODIUM CHLORIDE 0.9 % IV BOLUS (SEPSIS)
1000.0000 mL | Freq: Once | INTRAVENOUS | Status: AC
  Administered 2011-07-18: 1000 mL via INTRAVENOUS

## 2011-07-18 MED ORDER — TIOTROPIUM BROMIDE MONOHYDRATE 18 MCG IN CAPS
18.0000 ug | ORAL_CAPSULE | Freq: Every day | RESPIRATORY_TRACT | Status: DC
  Administered 2011-07-19 – 2011-07-24 (×6): 18 ug via RESPIRATORY_TRACT
  Filled 2011-07-18: qty 5

## 2011-07-18 MED ORDER — ALBUTEROL SULFATE (5 MG/ML) 0.5% IN NEBU
2.5000 mg | INHALATION_SOLUTION | Freq: Four times a day (QID) | RESPIRATORY_TRACT | Status: DC
  Administered 2011-07-18: 2.5 mg via RESPIRATORY_TRACT
  Filled 2011-07-18: qty 0.5

## 2011-07-18 MED ORDER — HYDROMORPHONE HCL PF 1 MG/ML IJ SOLN
1.0000 mg | INTRAMUSCULAR | Status: DC | PRN
  Administered 2011-07-18: 1 mg via INTRAVENOUS
  Filled 2011-07-18: qty 1

## 2011-07-18 MED ORDER — ONDANSETRON HCL 4 MG PO TABS: 4.0000 mg | ORAL_TABLET | Freq: Four times a day (QID) | ORAL | Status: DC | PRN

## 2011-07-18 MED ORDER — GUAIFENESIN ER 600 MG PO TB12
1200.0000 mg | ORAL_TABLET | Freq: Two times a day (BID) | ORAL | Status: DC
Start: 2011-07-18 — End: 2011-07-24
  Administered 2011-07-18 – 2011-07-24 (×12): 1200 mg via ORAL
  Filled 2011-07-18 (×12): qty 2

## 2011-07-18 MED ORDER — CEFTRIAXONE SODIUM 1 G IJ SOLR
1.0000 g | INTRAMUSCULAR | Status: DC
  Administered 2011-07-18 – 2011-07-23 (×6): 1 g via INTRAVENOUS
  Filled 2011-07-18 (×7): qty 10

## 2011-07-18 MED ORDER — ACETAMINOPHEN 650 MG RE SUPP: 650.0000 mg | Freq: Four times a day (QID) | RECTAL | Status: DC | PRN

## 2011-07-18 MED ORDER — DOXYCYCLINE HYCLATE 100 MG PO TABS
100.0000 mg | ORAL_TABLET | Freq: Two times a day (BID) | ORAL | Status: DC
  Administered 2011-07-18 – 2011-07-21 (×6): 100 mg via ORAL
  Filled 2011-07-18 (×6): qty 1

## 2011-07-18 MED ORDER — IPRATROPIUM BROMIDE 0.02 % IN SOLN
0.5000 mg | Freq: Once | RESPIRATORY_TRACT | Status: AC
  Administered 2011-07-18: 0.5 mg via RESPIRATORY_TRACT
  Filled 2011-07-18: qty 2.5

## 2011-07-18 MED ORDER — OXYCODONE HCL 5 MG PO TABS
ORAL_TABLET | ORAL | Status: AC
  Filled 2011-07-18: qty 6

## 2011-07-18 MED ORDER — HYDROMORPHONE HCL PF 1 MG/ML IJ SOLN
1.0000 mg | Freq: Once | INTRAMUSCULAR | Status: AC
  Administered 2011-07-18: 1 mg via INTRAVENOUS
  Filled 2011-07-18: qty 1

## 2011-07-18 MED ORDER — NICOTINE 21 MG/24HR TD PT24
21.0000 mg | MEDICATED_PATCH | TRANSDERMAL | Status: DC
  Administered 2011-07-18 – 2011-07-23 (×7): 21 mg via TRANSDERMAL
  Filled 2011-07-18 (×7): qty 1

## 2011-07-18 NOTE — ED Notes (Signed)
Crackers given. 

## 2011-07-18 NOTE — ED Notes (Signed)
Patient requesting pain medication. Medicated per PRN orders.

## 2011-07-18 NOTE — Progress Notes (Signed)
2150-notified MD of unchanged status of pain. Orders to follow.

## 2011-07-18 NOTE — ED Notes (Signed)
Attempted to call report. RN to call back. 

## 2011-07-18 NOTE — ED Notes (Signed)
Urine specimen to lab

## 2011-07-18 NOTE — ED Notes (Signed)
Report given to Cindy, RN ICU. Ready to receive patient.  

## 2011-07-18 NOTE — H&P (Signed)
PCP:   Dr. Sherwood Gambler  Pulmonologist: Dr. [name] Pain specialist: Dr. Gerilyn Pilgrim   Chief Complaint:  Fever, shortness of breath  HPI: This is a 46 y/o male with history of COPD who uses oxygen at home when needed.  Patient was in his usual state of health when he developed onset of fever, myalgias, headache, shortness of breath and cough.  All of these symptoms seemed to have onset yesterday.  His cough is productive of brown sputum.  He has difficulty walking to his bathroom due to worsening shortness of breath.  He says every joint in his body is painful. He reports being bit by a tick on his back approximately one week ago.  He has noted erythema around the bite, but denies any pain or pruritis. He came to the emergency room where he was noted to be febrile, hypotensive and tachycardic.   Allergies:   Allergies  Allergen Reactions  . Ambien (Zolpidem Tartrate) Other (See Comments)    hallucinations      Past Medical History  Diagnosis Date  . COPD (chronic obstructive pulmonary disease)     Past Surgical History  Procedure Date  . Joint replacement     Prior to Admission medications   Medication Sig Start Date End Date Taking? Authorizing Provider  albuterol (PROVENTIL HFA;VENTOLIN HFA) 108 (90 BASE) MCG/ACT inhaler Inhale 2 puffs into the lungs every 6 (six) hours as needed. Shortness of breath   Yes Historical Provider, MD  Fluticasone-Salmeterol (ADVAIR) 500-50 MCG/DOSE AEPB Inhale 1 puff into the lungs every 12 (twelve) hours.   Yes Historical Provider, MD  oxycodone (ROXICODONE) 30 MG immediate release tablet Take 30 mg by mouth 4 (four) times daily.   Yes Historical Provider, MD  tiotropium (SPIRIVA) 18 MCG inhalation capsule Place 18 mcg into inhaler and inhale daily.   Yes Historical Provider, MD    Social History:  reports that he has been smoking.  He does not have any smokeless tobacco history on file. He reports that he does not drink alcohol or use illicit  drugs.  History reviewed. No pertinent family history.  Review of Systems:  Constitutional: Denies fever, chills, diaphoresis, appetite change and fatigue.  HEENT: Denies photophobia, eye pain, redness, hearing loss, ear pain, congestion, sore throat, rhinorrhea, sneezing, mouth sores, trouble swallowing, neck pain, neck stiffness and tinnitus.   Respiratory: Denies SOB, DOE, cough, chest tightness,  and wheezing.   Cardiovascular: Denies chest pain, palpitations and leg swelling.  Gastrointestinal: Denies nausea, vomiting, abdominal pain, diarrhea, constipation, blood in stool and abdominal distention.  Genitourinary: Denies dysuria, urgency, frequency, hematuria, flank pain and difficulty urinating.  Musculoskeletal: Denies myalgias, back pain, joint swelling, arthralgias and gait problem.  Skin: Denies pallor, rash and wound.  Neurological: Denies dizziness, seizures, syncope, weakness, light-headedness, numbness and headaches.  Hematological: Denies adenopathy. Easy bruising, personal or family bleeding history  Psychiatric/Behavioral: Denies suicidal ideation, mood changes, confusion, nervousness, sleep disturbance and agitation   Physical Exam: Blood pressure 108/64, pulse 117, temperature 97.9 F (36.6 C), temperature source Oral, resp. rate 22, height 6\' 1"  (1.854 m), weight 77 kg (169 lb 12.1 oz), SpO2 92.00%. General: Patient is lying in bed, doesn't appear to be in any acute distress, alert and oriented x3, HEENT: Normocephalic, atraumatic, left pupil does not react to light which is a chronic problem, right pupil is reactive to light. Neck: Supple Chest: Diffuse expiratory wheezes bilaterally Cardiac: S1, S2, tachycardic Abdomen: Soft, nontender, bowel sounds are active, nondistended Extremities: No cyanosis, clubbing,  edema Neurologic: Grossly intact, nonfocal Skin: Warm, area of circumferential erythema around tick bite over his lower thoracic spine, no expressible  pus.  Labs on Admission:  Results for orders placed during the hospital encounter of 07/18/11 (from the past 48 hour(s))  CBC     Status: Abnormal   Collection Time   07/18/11  3:05 PM      Component Value Range Comment   WBC 22.0 (*) 4.0 - 10.5 (K/uL)    RBC 4.73  4.22 - 5.81 (MIL/uL)    Hemoglobin 15.0  13.0 - 17.0 (g/dL)    HCT 14.7  82.9 - 56.2 (%)    MCV 94.3  78.0 - 100.0 (fL)    MCH 31.7  26.0 - 34.0 (pg)    MCHC 33.6  30.0 - 36.0 (g/dL)    RDW 13.0  86.5 - 78.4 (%)    Platelets 259  150 - 400 (K/uL)   DIFFERENTIAL     Status: Abnormal   Collection Time   07/18/11  3:05 PM      Component Value Range Comment   Neutrophils Relative 87 (*) 43 - 77 (%)    Neutro Abs 19.1 (*) 1.7 - 7.7 (K/uL)    Lymphocytes Relative 6 (*) 12 - 46 (%)    Lymphs Abs 1.4  0.7 - 4.0 (K/uL)    Monocytes Relative 6  3 - 12 (%)    Monocytes Absolute 1.4 (*) 0.1 - 1.0 (K/uL)    Eosinophils Relative 0  0 - 5 (%)    Eosinophils Absolute 0.1  0.0 - 0.7 (K/uL)    Basophils Relative 0  0 - 1 (%)    Basophils Absolute 0.1  0.0 - 0.1 (K/uL)   COMPREHENSIVE METABOLIC PANEL     Status: Abnormal   Collection Time   07/18/11  3:05 PM      Component Value Range Comment   Sodium 135  135 - 145 (mEq/L)    Potassium 3.6  3.5 - 5.1 (mEq/L)    Chloride 96  96 - 112 (mEq/L)    CO2 28  19 - 32 (mEq/L)    Glucose, Bld 106 (*) 70 - 99 (mg/dL)    BUN 9  6 - 23 (mg/dL)    Creatinine, Ser 6.96  0.50 - 1.35 (mg/dL)    Calcium 9.7  8.4 - 10.5 (mg/dL)    Total Protein 7.6  6.0 - 8.3 (g/dL)    Albumin 3.9  3.5 - 5.2 (g/dL)    AST 16  0 - 37 (U/L)    ALT 13  0 - 53 (U/L)    Alkaline Phosphatase 123 (*) 39 - 117 (U/L)    Total Bilirubin 0.4  0.3 - 1.2 (mg/dL)    GFR calc non Af Amer >90  >90 (mL/min)    GFR calc Af Amer >90  >90 (mL/min)   LACTIC ACID, PLASMA     Status: Normal   Collection Time   07/18/11  3:15 PM      Component Value Range Comment   Lactic Acid, Venous 1.1  0.5 - 2.2 (mmol/L)   URINALYSIS, ROUTINE  W REFLEX MICROSCOPIC     Status: Abnormal   Collection Time   07/18/11  4:47 PM      Component Value Range Comment   Color, Urine STRAW (*) YELLOW     APPearance CLEAR  CLEAR     Specific Gravity, Urine <1.005 (*) 1.005 - 1.030     pH 6.0  5.0 - 8.0     Glucose, UA NEGATIVE  NEGATIVE (mg/dL)    Hgb urine dipstick NEGATIVE  NEGATIVE     Bilirubin Urine NEGATIVE  NEGATIVE     Ketones, ur NEGATIVE  NEGATIVE (mg/dL)    Protein, ur NEGATIVE  NEGATIVE (mg/dL)    Urobilinogen, UA 0.2  0.0 - 1.0 (mg/dL)    Nitrite NEGATIVE  NEGATIVE     Leukocytes, UA NEGATIVE  NEGATIVE  MICROSCOPIC NOT DONE ON URINES WITH NEGATIVE PROTEIN, BLOOD, LEUKOCYTES, NITRITE, OR GLUCOSE <1000 mg/dL.    Radiological Exams on Admission: Dg Chest Port 1 View  07/18/2011  *RADIOLOGY REPORT*  Clinical Data: Cough, body aches, difficulty breathing, recent tick exposure  PORTABLE CHEST - 1 VIEW  Comparison: Portable exam 1517 hours compared to 03/28/2010  Findings: Normal heart size, mediastinal contours, and pulmonary vascularity. Large bullae are seen in the upper lobes bilaterally left greater than right. Right upper lobe atelectasis adjacent to minor fissure. Mild bibasilar atelectasis. The accentuated markings and atelectasis in the mid-to-lower lungs appear stable since previous study and no definite acute infiltrate or pleural effusion is identified. No definite pneumothorax. Bones unremarkable.  IMPRESSION: Changes of COPD with extensive bullous disease in the upper lobes. Chronic bibasilar changes and atelectasis.  Original Report Authenticated By: Lollie Marrow, M.D.    Assessment/Plan Active Problems:  Fever  Myalgia  Leukocytosis  COPD (chronic obstructive pulmonary disease)  Acute-on-chronic respiratory failure  Tachycardia  Dehydration  Chronic pain  Tobacco abuse  Plan: #1 fever. Unclear etiology. This may be related to a tick borne illness. It may also be a pneumonia which has not shown up on chest x-ray.  We will cover the patient with antibiotics, ceftriaxone and doxycycline. We will send for ehrlichiosis, lyme, [place] spotted fever. With his acute onset shortness of breath, tachycardia and leukocytosis, we will do CT scan of the chest to rule out any underlying pulmonary embolus.  #2 COPD. Patient is actively wheezing. We will continue him on his nebulizer treatments, supplemental oxygen, we'll also start low-dose steroids.  #3. Leukocytosis. Likely from underlying infection, continue to follow.  #4. Myalgias. Likely due to fevers.   #5 dehydration. Patient will be aggressively hydrated with IV fluids.  Patient will be admitted to the step down unit, further orders per the clinical course  Time Spent on Admission:  Lynn Recendiz [facility] Pager: [phone] 07/18/2011, 8:06 PM

## 2011-07-18 NOTE — Progress Notes (Signed)
Pt has been very non complaint since I have been working with him didn't want to wear v. Mask when his spo2 had dropped and I couldn't get it back up. He states it's ok. I wouldn't have take treatment and got upset when I kept waking him up and I he got mad I told him I would put him on a mask and he said no I expressed to him how important it is for him to take his treatments to improve in breathing status. Next treatment will be given with a mask.

## 2011-07-18 NOTE — ED Notes (Signed)
Fever, cough, sob,  Removed tick recently.  Uses 02 at home, 3 L.  Prn.  Has been taking breathing tx,  Feels weak

## 2011-07-18 NOTE — ED Provider Notes (Cosign Needed)
History   This chart was scribed for Wesley Lennert, MD by Brooks Sailors. The patient was seen in room APA04/APA04. Patient's care was started at 1434.   CSN: [id number]  Arrival date & time 07/18/11  1434   First MD Initiated Contact with Patient 07/18/11 1445      No chief complaint on file.   (Consider location/radiation/quality/duration/timing/severity/associated sxs/prior treatment) Patient is a 46 y.o. male presenting with shortness of breath. The history is provided by the patient.  Shortness of Breath  The current episode started yesterday. The onset was gradual. The problem occurs continuously. The problem has been unchanged. The problem is severe. The symptoms are relieved by nothing. The symptoms are aggravated by nothing. Associated symptoms include shortness of breath.    Wesley [name] is a 46 y.o. male who presents to the Emergency Department complaining of severe SOB onset last night and persistent since with associates aches,  headaches and weakness. Pt says he picked a tick off his back a few days ago and has a rash where the tick bit him.  PCP [name]    Past Medical History  Diagnosis Date  . COPD (chronic obstructive pulmonary disease)     Past Surgical History  Procedure Date  . Joint replacement     History reviewed. No pertinent family history.  History  Substance Use Topics  . Smoking status: Current Everyday Smoker  . Smokeless tobacco: Not on file  . Alcohol Use: No      Review of Systems  Respiratory: Positive for shortness of breath.   All other systems reviewed and are negative.    Allergies  Ambien  Home Medications  No current outpatient prescriptions on file.  BP 92/59  Pulse 132  Temp(Src) 101.2 F (38.4 C) (Oral)  Resp 28  Wt 160 lb (72.576 kg)  SpO2 93%  Physical Exam  Constitutional: He is oriented to person, place, and time. He appears well-developed.  HENT:  Head: Normocephalic and atraumatic.  Eyes:  Conjunctivae and EOM are normal. No scleral icterus.  Neck: Neck supple. No thyromegaly present.  Cardiovascular: Normal rate and regular rhythm.  Exam reveals no gallop and no friction rub.   No murmur heard. Pulmonary/Chest: No stridor. He has wheezes (bilaterally). He has no rales. He exhibits no tenderness.  Abdominal: He exhibits no distension. There is no tenderness. There is no rebound.  Musculoskeletal: Normal range of motion. He exhibits no edema.  Lymphadenopathy:    He has no cervical adenopathy.  Neurological: He is oriented to person, place, and time. Coordination normal.  Skin: No erythema.       Small scar, upper back.   Psychiatric: He has a normal mood and affect. His behavior is normal.    ED Course  Procedures (including critical care time) 252PM Patient informed of current plan for treatment and evaluation and agrees with plan at this time.   Labs Reviewed - No data to display No results found.   No diagnosis found.    MDM     Date: 09/14/2011  Rate: 121  Rhythm: sinus tachycardia  QRS Axis: left  Intervals: normal  ST/T Wave abnormalities: nonspecific ST changes  Conduction Disutrbances:none  Narrative Interpretation:   Old EKG Reviewed: none available      The chart was scribed for me under my direct supervision.  I personally performed the history, physical, and medical decision making and all procedures in the evaluation of this patient.Wesley Lennert,  MD 07/18/11 1658  Wesley Lennert, MD 09/14/11 1349

## 2011-07-19 DIAGNOSIS — J9620 Acute and chronic respiratory failure, unspecified whether with hypoxia or hypercapnia: Secondary | ICD-10-CM

## 2011-07-19 DIAGNOSIS — R509 Fever, unspecified: Secondary | ICD-10-CM

## 2011-07-19 DIAGNOSIS — J438 Other emphysema: Secondary | ICD-10-CM

## 2011-07-19 DIAGNOSIS — D696 Thrombocytopenia, unspecified: Secondary | ICD-10-CM

## 2011-07-19 DIAGNOSIS — J189 Pneumonia, unspecified organism: Secondary | ICD-10-CM | POA: Diagnosis present

## 2011-07-19 LAB — BASIC METABOLIC PANEL
CO2: 27 mEq/L (ref 19–32)
Calcium: 9.8 mg/dL (ref 8.4–10.5)
Creatinine, Ser: 0.7 mg/dL (ref 0.50–1.35)
GFR calc Af Amer: >90 mL/min (ref >90)
GFR calc non Af Amer: >90 mL/min (ref >90)
Sodium: 137 mEq/L (ref 135–145)

## 2011-07-19 LAB — CARDIAC PANEL(CRET KIN+CKTOT+MB+TROPI)
CK, MB: 3.1 ng/mL (ref 0.3–4.0)
CK, MB: 3.5 ng/mL (ref 0.3–4.0)
Total CK: 79 U/L (ref 7–232)
Total CK: 69 U/L (ref 7–232)
Troponin I: <0.3 ng/mL (ref <0.30)

## 2011-07-19 LAB — STREP PNEUMONIAE URINARY ANTIGEN: Strep Pneumo Urinary Antigen: NEGATIVE

## 2011-07-19 MED ORDER — METHYLPREDNISOLONE SODIUM SUCC 125 MG IJ SOLR
60.0000 mg | Freq: Four times a day (QID) | INTRAMUSCULAR | Status: DC
  Administered 2011-07-19 – 2011-07-21 (×8): 60 mg via INTRAVENOUS
  Filled 2011-07-19 (×8): qty 2

## 2011-07-19 MED ORDER — ENSURE COMPLETE PO LIQD
237.0000 mL | Freq: Every day | ORAL | Status: DC
  Administered 2011-07-20 – 2011-07-23 (×4): 237 mL via ORAL

## 2011-07-19 MED ORDER — OXYCODONE HCL 5 MG PO TABS
30.0000 mg | ORAL_TABLET | ORAL | Status: DC | PRN
  Administered 2011-07-19 – 2011-07-22 (×15): 30 mg via ORAL
  Filled 2011-07-19 (×15): qty 6

## 2011-07-19 NOTE — Plan of Care (Signed)
Problem: Phase I Progression Outcomes Goal: OOB as tolerated unless otherwise ordered Outcome: Progressing Has generalized weakness at this time. Needs some assistance with OOB activity. Goal: Voiding-avoid urinary catheter unless indicated Outcome: Completed/Met Date Met:  07/19/11 Pt uses urinal.

## 2011-07-19 NOTE — Consult Note (Signed)
ANTIBIOTIC CONSULT NOTE - INITIAL  Pharmacy Consult for Antibiotic Monitoring (Rocephin and Doxycycline)  Indication: fever, SOB  Allergies  Allergen Reactions  . Ambien (Zolpidem Tartrate) Other (See Comments)    hallucinations    Patient Measurements: Height: 6\' 1"  (185.4 cm) Weight: 169 lb 12.1 oz (77 kg) IBW/kg (Calculated) : 79.9   Vital Signs: Temp: 97.3 F (36.3 C) (05/15 0800) Temp src: Oral (05/15 0800) BP: 106/56 mmHg (05/15 0800) Pulse Rate: 104  (05/15 0900) Intake/Output from previous day: 05/14 0701 - 05/15 0700 In: 3715 [P.O.:2520; I.V.:1145; IV Piggyback:50] Out: 2300 [Urine:2300] Intake/Output from this shift: Total I/O In: 460 [P.O.:360; I.V.:100] Out: 700 [Urine:700]  Labs:  [facility] 07/19/11 0910 07/18/11 1505  WBC -- 22.0*  HGB -- 15.0  PLT -- 259  LABCREA -- --  CREATININE 0.70 0.73   Estimated Creatinine Clearance: 123 ml/min (by C-G formula based on Cr of 0.7). No results found for this basename: VANCOTROUGH:2,VANCOPEAK:2,VANCORANDOM:2,GENTTROUGH:2,GENTPEAK:2,GENTRANDOM:2,TOBRATROUGH:2,TOBRAPEAK:2,TOBRARND:2,AMIKACINPEAK:2,AMIKACINTROU:2,AMIKACIN:2, in the last 72 hours   Microbiology: Recent Results (from the past 720 hour(s))  CULTURE, BLOOD (ROUTINE X 2)     Status: Normal (Preliminary result)   Collection Time   07/18/11  3:05 PM      Component Value Range Status Comment   Specimen Description BLOOD LEFT ARM DRAWN BY RN   Final    Special Requests BOTTLES DRAWN AEROBIC AND ANAEROBIC 6CC   Final    Culture NO GROWTH 1 DAY   Final    Report Status PENDING   Incomplete   CULTURE, BLOOD (ROUTINE X 2)     Status: Normal (Preliminary result)   Collection Time   07/18/11  3:15 PM      Component Value Range Status Comment   Specimen Description BLOOD RIGHT ANTECUBITAL   Final    Special Requests BOTTLES DRAWN AEROBIC AND ANAEROBIC 12CC   Final    Culture NO GROWTH 1 DAY   Final    Report Status PENDING   Incomplete   MRSA PCR SCREENING      Status: Normal   Collection Time   07/18/11  7:09 PM      Component Value Range Status Comment   MRSA by PCR NEGATIVE  NEGATIVE  Final   CULTURE, EXPECTORATED SPUTUM-ASSESSMENT     Status: Normal   Collection Time   07/18/11  9:07 PM      Component Value Range Status Comment   Specimen Description SPUTUM   Final    Special Requests NONE   Final    Sputum evaluation     Final    Value: THIS SPECIMEN IS ACCEPTABLE. RESPIRATORY CULTURE REPORT TO FOLLOW.   Report Status 07/18/2011 FINAL   Final     Medical History: Past Medical History  Diagnosis Date  . COPD (chronic obstructive pulmonary disease)     Medications:  Scheduled:    . acetaminophen  1,000 mg Oral Once  . albuterol  2.5 mg Nebulization Q4H  . albuterol  5 mg Nebulization Once  . cefTRIAXone (ROCEPHIN)  IV  1 g Intravenous Q24H  . doxycycline  100 mg Oral Q12H  . doxycycline (VIBRAMYCIN) IV  100 mg Intravenous Once  . enoxaparin  40 mg Subcutaneous Q24H  . Fluticasone-Salmeterol  1 puff Inhalation Q12H  . guaiFENesin  1,200 mg Oral BID  .  HYDROmorphone (DILAUDID) injection  1 mg Intravenous Once  . ipratropium  0.5 mg Nebulization Once  . methylPREDNISolone (SOLU-MEDROL) injection  60 mg Intravenous Q6H  . nicotine  21  mg Transdermal Q24H  . oxyCODONE      . sodium chloride  1,000 mL Intravenous Once  . sodium chloride  1,000 mL Intravenous Once  . tiotropium  18 mcg Inhalation Daily  . DISCONTD: sodium chloride   Intravenous STAT  . DISCONTD: albuterol  2.5 mg Nebulization Q6H  . DISCONTD: methylPREDNISolone (SOLU-MEDROL) injection  60 mg Intravenous Q12H   Assessment: SCr OK On Rocephin 1gm iv q24hrs and Doxycycline 100mg  po bid  Goal of Therapy:  Eradicate infection.  Plan: Continue current Rx No adjustment needed at this time  Wesley [name] A 07/19/2011,10:31 AM

## 2011-07-19 NOTE — Progress Notes (Signed)
Subjective: Patient is still very short of breath and wheezing, sitting up on the side of the bed, coughing, complaining of back/ hip pain  Objective: Vital signs in last 24 hours: Temp:  [97.9 F (36.6 C)-101.2 F (38.4 C)] 98 F (36.7 C) (05/15 0532) Pulse Rate:  [90-133] 90  (05/15 0700) Resp:  [14-28] 17  (05/15 0700) BP: (86-111)/(35-78) 100/60 mmHg (05/15 0700) SpO2:  [88 %-99 %] 96 % (05/15 0728) FiO2 (%):  [50 %] 50 % (05/14 2120) Weight:  [72.576 kg (160 lb)-77 kg (169 lb 12.1 oz)] 77 kg (169 lb 12.1 oz) (05/14 1954) Weight change:  Last BM Date: 07/17/11  Intake/Output from previous day: 05/14 0701 - 05/15 0700 In: 3715 [P.O.:2520; I.V.:1145; IV Piggyback:50] Out: 2300 [Urine:2300]     Physical Exam: General: Alert, awake, oriented x3, increased wob. HEENT: No bruits, no goiter. Heart: Regular rate and rhythm, without murmurs, rubs, gallops. Lungs: diminished breath sounds b/l with exp wheeze. Abdomen: Soft, nontender, nondistended, positive bowel sounds. Extremities: No clubbing cyanosis or edema with positive pedal pulses. Neuro: Grossly intact, nonfocal.    Lab Results: Basic Metabolic Panel:  Basename 07/18/11 1505  NA 135  K 3.6  CL 96  CO2 28  GLUCOSE 106*  BUN 9  CREATININE 0.73  CALCIUM 9.7  MG --  PHOS --   Liver Function Tests:  Basename 07/18/11 1505  AST 16  ALT 13  ALKPHOS 123*  BILITOT 0.4  PROT 7.6  ALBUMIN 3.9   No results found for this basename: LIPASE:2,AMYLASE:2 in the last 72 hours No results found for this basename: AMMONIA:2 in the last 72 hours CBC:  Basename 07/18/11 1505  WBC 22.0*  NEUTROABS 19.1*  HGB 15.0  HCT 44.6  MCV 94.3  PLT 259   Cardiac Enzymes:  Basename 07/19/11 0318 07/18/11 1945  CKTOTAL 79 73  CKMB 3.1 2.3  CKMBINDEX -- --  TROPONINI <0.30 <0.30   BNP: No results found for this basename: PROBNP:3 in the last 72 hours D-Dimer: No results found for this basename: DDIMER:2 in the last  72 hours CBG: No results found for this basename: GLUCAP:6 in the last 72 hours Hemoglobin A1C: No results found for this basename: HGBA1C in the last 72 hours Fasting Lipid Panel: No results found for this basename: CHOL,HDL,LDLCALC,TRIG,CHOLHDL,LDLDIRECT in the last 72 hours Thyroid Function Tests: No results found for this basename: TSH,T4TOTAL,FREET4,T3FREE,THYROIDAB in the last 72 hours Anemia Panel: No results found for this basename: VITAMINB12,FOLATE,FERRITIN,TIBC,IRON,RETICCTPCT in the last 72 hours Coagulation: No results found for this basename: LABPROT:2,INR:2 in the last 72 hours Urine Drug Screen: Drugs of Abuse  No results found for this basename: labopia, cocainscrnur, labbenz, amphetmu, thcu, labbarb    Alcohol Level: No results found for this basename: ETH:2 in the last 72 hours Urinalysis:  Basename 07/18/11 1647  COLORURINE STRAW*  LABSPEC <1.005*  PHURINE 6.0  GLUCOSEU NEGATIVE  HGBUR NEGATIVE  BILIRUBINUR NEGATIVE  KETONESUR NEGATIVE  PROTEINUR NEGATIVE  UROBILINOGEN 0.2  NITRITE NEGATIVE  LEUKOCYTESUR NEGATIVE    Recent Results (from the past 240 hour(s))  MRSA PCR SCREENING     Status: Normal   Collection Time   07/18/11  7:09 PM      Component Value Range Status Comment   MRSA by PCR NEGATIVE  NEGATIVE  Final   CULTURE, EXPECTORATED SPUTUM-ASSESSMENT     Status: Normal   Collection Time   07/18/11  9:07 PM      Component Value Range Status Comment  Specimen Description SPUTUM   Final    Special Requests NONE   Final    Sputum evaluation     Final    Value: THIS SPECIMEN IS ACCEPTABLE. RESPIRATORY CULTURE REPORT TO FOLLOW.   Report Status 07/18/2011 FINAL   Final     Studies/Results: Ct Angio Chest W/cm &/or Wo Cm  07/18/2011  *RADIOLOGY REPORT*  Clinical Data: Fever.  Shortness of breath.  Cough.  Tick bite 1 week ago.  COPD.  CT ANGIOGRAPHY CHEST  Technique:  Multidetector CT imaging of the chest using the standard protocol during bolus  administration of intravenous contrast. Multiplanar reconstructed images including MIPs were obtained and reviewed to evaluate the vascular anatomy.  Contrast: OMNIPAQUE IOHEXOL 350 MG/ML SOLN  Comparison: 07/18/2011; 01/13/2009  Findings: No filling defect is identified in the pulmonary arterial tree to suggest pulmonary embolus.  Prominent main pulmonary arteries compatible with pulmonary arterial hypertension which is probably secondary to the markedly severe emphysema.  A subcarinal lymph node has a short axis diameter 1.1 cm, within normal limits.  The right hilar node has a short axis diameter 1.2 cm, mildly enlarged.  No cardiomegaly noted.  No pleural effusion identified.  Markedly severe emphysema is present with very large bullae bilaterally, particularly in the upper lobes.  Bilateral reticulonodular interstitial opacity with a "tree in bud" configuration is most compatible with atypical infectious bronchiolitis and is new compared the prior exam.  The appearance is rather diffuse and could also reflect a viral process, but is not characteristic for typical bacterial pneumonia.  The bullae have significantly expanded since the prior CT scan, especially in the right lung.  Minimal subsegmental atelectasis is present in both posterior basal segments of the lower lobes.  IMPRESSION:  1. No filling defect is identified in the pulmonary arterial tree to suggest pulmonary embolus. 2.  Bilateral reticulonodular interstitial opacities new compared the prior CT scan from 2010, and probably reflects viral/atypical pneumonia or atypical infectious bronchiolitis. 3.  Markedly severe bullous lung disease, worsened compared to 2010. 4.  Mild right hilar adenopathy, most likely reactive. 5.  Enlarged main pulmonary artery compatible with secondary pulmonary arterial hypertension.  Original Report Authenticated By: Dellia Cloud, M.D.   Dg Chest Port 1 View  07/18/2011  *RADIOLOGY REPORT*  Clinical Data:  Cough, body aches, difficulty breathing, recent tick exposure  PORTABLE CHEST - 1 VIEW  Comparison: Portable exam 1517 hours compared to 03/28/2010  Findings: Normal heart size, mediastinal contours, and pulmonary vascularity. Large bullae are seen in the upper lobes bilaterally left greater than right. Right upper lobe atelectasis adjacent to minor fissure. Mild bibasilar atelectasis. The accentuated markings and atelectasis in the mid-to-lower lungs appear stable since previous study and no definite acute infiltrate or pleural effusion is identified. No definite pneumothorax. Bones unremarkable.  IMPRESSION: Changes of COPD with extensive bullous disease in the upper lobes. Chronic bibasilar changes and atelectasis.  Original Report Authenticated By: Lollie Marrow, M.D.    Medications: Scheduled Meds:   . acetaminophen  1,000 mg Oral Once  . albuterol  2.5 mg Nebulization Q4H  . albuterol  5 mg Nebulization Once  . cefTRIAXone (ROCEPHIN)  IV  1 g Intravenous Q24H  . doxycycline  100 mg Oral Q12H  . doxycycline (VIBRAMYCIN) IV  100 mg Intravenous Once  . enoxaparin  40 mg Subcutaneous Q24H  . Fluticasone-Salmeterol  1 puff Inhalation Q12H  . guaiFENesin  1,200 mg Oral BID  .  HYDROmorphone (DILAUDID) injection  1 mg Intravenous Once  . ipratropium  0.5 mg Nebulization Once  . methylPREDNISolone (SOLU-MEDROL) injection  60 mg Intravenous Q12H  . nicotine  21 mg Transdermal Q24H  . oxyCODONE      . sodium chloride  1,000 mL Intravenous Once  . sodium chloride  1,000 mL Intravenous Once  . tiotropium  18 mcg Inhalation Daily  . DISCONTD: sodium chloride   Intravenous STAT  . DISCONTD: albuterol  2.5 mg Nebulization Q6H   Continuous Infusions:   . sodium chloride 100 mL/hr at 07/19/11 0700   PRN Meds:.acetaminophen, acetaminophen, albuterol, HYDROmorphone (DILAUDID) injection, iohexol, ondansetron (ZOFRAN) IV, ondansetron, oxycodone, DISCONTD:  HYDROmorphone (DILAUDID) injection, DISCONTD:  ondansetron (ZOFRAN) IV  Assessment/Plan:  Active Problems:  Fever  Myalgia  Leukocytosis  COPD (chronic obstructive pulmonary disease)  Acute-on-chronic respiratory failure  Tachycardia  Dehydration  Chronic pain  Tobacco abuse  Plan: This gentleman was admitted yesterday with shortness of breath, fever and generalized myalgia  1. Acute on chronic respiratory failure. This is likely due to pneumonia and COPD.  His CT chest is negative for pulmonary embolism, but did show a possibly atypical pneumonia.  We will continue with current antibiotics of rocephin and doxycycline.  2. Pneumonia, see above  3. COPD.  Patient has severe COPD. He is still wheezing, will increase steroid dose.  Cont nebs.  4. Leukocytosis.  This appears to be a chronic problem.  He does not report ever seeing a hematologist.  This will likely need to be pursued as an outpatient.  5. Dehydration, improving with IVF, continue the same.  6. Fever.  Likely secondary to pneumonia, although he has suffered a tick bite a week ago.  Serologies were sent and will need to be followed up.  7. Dispo.  Monitor in step down unit today, possible transfer out tomorrow if respiratory status is improving.   LOS: 1 day   Binnie Vonderhaar [facility] Pager: [phone] 07/19/2011, 8:01 AM   \

## 2011-07-19 NOTE — Progress Notes (Signed)
INITIAL ADULT NUTRITION ASSESSMENT Date: 07/19/2011   Time: 1:45 PM Reason for Assessment: Nutrition Risk Screen-wt loss  ASSESSMENT: Male 46 y.o.  Dx: Acute on Chronic Respiratory failure  Past Medical History  Diagnosis Date  . COPD (chronic obstructive pulmonary disease)    Scheduled Meds:   . acetaminophen  1,000 mg Oral Once  . albuterol  2.5 mg Nebulization Q4H  . albuterol  5 mg Nebulization Once  . cefTRIAXone (ROCEPHIN)  IV  1 g Intravenous Q24H  . doxycycline  100 mg Oral Q12H  . doxycycline (VIBRAMYCIN) IV  100 mg Intravenous Once  . enoxaparin  40 mg Subcutaneous Q24H  . Fluticasone-Salmeterol  1 puff Inhalation Q12H  . guaiFENesin  1,200 mg Oral BID  .  HYDROmorphone (DILAUDID) injection  1 mg Intravenous Once  . ipratropium  0.5 mg Nebulization Once  . methylPREDNISolone (SOLU-MEDROL) injection  60 mg Intravenous Q6H  . nicotine  21 mg Transdermal Q24H  . oxyCODONE      . sodium chloride  1,000 mL Intravenous Once  . sodium chloride  1,000 mL Intravenous Once  . tiotropium  18 mcg Inhalation Daily  . DISCONTD: sodium chloride   Intravenous STAT  . DISCONTD: albuterol  2.5 mg Nebulization Q6H  . DISCONTD: methylPREDNISolone (SOLU-MEDROL) injection  60 mg Intravenous Q12H   Continuous Infusions:   . sodium chloride 100 mL/hr at 07/19/11 0800   PRN Meds:.acetaminophen, acetaminophen, albuterol, HYDROmorphone (DILAUDID) injection, iohexol, ondansetron (ZOFRAN) IV, ondansetron, oxycodone, DISCONTD:  HYDROmorphone (DILAUDID) injection, DISCONTD: ondansetron (ZOFRAN) IV  Ht: 6\' 1"  (185.4 cm)  Wt: 169 lb 12.1 oz (77 kg)  Ideal Wt:  79.9 kg % Ideal Wt: 97%  Usual Wt: 198# (three months ago) % Usual Wt: 86%  Body mass index is 22.40 kg/(m^2). Normal  Food/Nutrition Related Hx: Pt sitting up eating lunch during visit. Feeds himself. Currently very good appetite and po intake. He reports severe unintentional wt loss past 90d. Denies chewing/swallowing  difficulty.    CMP     Component Value Date/Time   NA 137 07/19/2011 0910   K 4.2 07/19/2011 0910   CL 100 07/19/2011 0910   CO2 27 07/19/2011 0910   GLUCOSE 213* 07/19/2011 0910   BUN 9 07/19/2011 0910   CREATININE 0.70 07/19/2011 0910   CALCIUM 9.8 07/19/2011 0910   PROT 7.6 07/18/2011 1505   ALBUMIN 3.9 07/18/2011 1505   AST 16 07/18/2011 1505   ALT 13 07/18/2011 1505   ALKPHOS 123* 07/18/2011 1505   BILITOT 0.4 07/18/2011 1505   GFRNONAA >90 07/19/2011 0910   GFRAA >90 07/19/2011 0910    Intake/Output Summary (Last 24 hours) at 07/19/11 1348 Last data filed at 07/19/11 1300  Gross per 24 hour  Intake   4775 ml  Output   3550 ml  Net   1225 ml   Diet Order: Regular-75-100% meal intake  Supplements/Tube Feeding:none   IVF:    sodium chloride Last Rate: 100 mL/hr at 07/19/11 0800    Estimated Nutritional Needs:   Kcal:1925-2310 kcal per day Protein:85-100 grams per day Fluid:1.9-2.3 L per day  NUTRITION DIAGNOSIS: -Unplanned wt loss  RELATED TO: unknown etiology  AS EVIDENCE BY: unintentional wt loss of 28#, 14% x 90 d without change in oral intake per pt report  MONITORING/EVALUATION(Goals): -Po and oral supplement intake, wt trends and labs Pt will continue to consume 75-100% of meals to meet > 80% of nutritional needs.  EDUCATION NEEDS: -Education needs addressed  INTERVENTION: -Add Ensure daily (  350 kcal, 13 grams protein)  Dietitian [phone]  DOCUMENTATION CODES Per approved criteria  -Not Applicable    Francene Boyers 07/19/2011, 1:45 PM

## 2011-07-19 NOTE — Progress Notes (Signed)
UR Chart Review Completed  

## 2011-07-19 NOTE — Progress Notes (Signed)
Report called to L, Shonowitz, Charity fundraiser. Pt alert, oriented and in stable condition at the time of transport. Pt being transported to tele on oxygen in wheelchair by NT.

## 2011-07-19 NOTE — Care Management Note (Addendum)
    Page 1 of 1   07/24/2011     4:01:14 PM   CARE MANAGEMENT NOTE 07/24/2011  Patient:  Wesley [name], Wesley [name]   Account Number:  [id number]  Date Initiated:  07/19/2011  Documentation initiated by:  Sharrie Rothman  Subjective/Objective Assessment:   Pt admitted from home with pneumonia.Pt lives with wife who is disabled. Pt is currently unemployed and has no insurance.     Action/Plan:   CM will assist with medications at discharge and pt may need O2 arranged at discharge. Finanical counselor is aware that pt is self pay. Will continue to monitor.   Anticipated DC Date:  07/25/2011   Anticipated DC Plan:  HOME/SELF CARE  In-house referral  Financial Counselor      DC Planning Services  CM consult      Choice offered to / List presented to:             Status of service:  Completed, signed off Medicare Important Message given?   (If response is "NO", the following Medicare IM given date fields will be blank) Date Medicare IM given:   Date Additional Medicare IM given:    Discharge Disposition:  HOME/SELF CARE  Per UR Regulation:    If discussed at Long Length of Stay Meetings, dates discussed:    Comments:  07/24/11 1400 Jalyiah Shelley Leanord Hawking RN BSN CM After DC, CM contacted [facility] to see if they had O2 in pt's home. Per CA, O2 concentrator and CPAP was in the home despite a very large bill. The canisters were empty. Of note, the last O2 Sat was 91% on RA prior to DC.  07/24/11 1130 Lynleigh Kovack Leanord Hawking RN BSN CM Spoke at length with patient. Pt states he still does not have a PCP. [facility] will not see him again and he owes Dr. [name] $. Pt ordered O2 and HH RN. I explained to him that I could get both provided through [facility] but he must have a PCP. He then stated he had O2 at home, contridicting what he told me on Friday. He also stated that he walked the hospital hall this weekend and his sats were in the 90's. RN checked Sats which were at 87 on room air. MD notified that pt wanted to go  home without O2 and HH RN. He would get a PCP. CM called [facility] Dept. Pt actually lives across the street. Advised that pt could make an appointment with the HD and then be entered into the Medication Assistance Program. CM able to assist with Indigent Medication Fund with Antibiotics, Lasix and Predinisone. (most important per MD). Pt advised to get with HD ASAP with assistance on inhalers.  07/21/11 1130 Trea Carnegie Leanord Hawking RN BSN CM Spoke to pt at bedside. He is very anxious about his health and his finances. Used to have O2 and concentrator from [employer] but when he lost his job and had not way to pay, they removed it. CM told him we could set him up with [facility] if he needs it at discharge (Abby at [facility] notified) Also spoke to pt about [facility] sending out an Charity fundraiser and SW. Lubertha Basque in the financial department contacted to assist pt too if possible. Pt may need medication assistance at DC to prevent readmission. 07/19/11 1128 Arlyss Queen, RN BSN CM

## 2011-07-20 DIAGNOSIS — J438 Other emphysema: Secondary | ICD-10-CM

## 2011-07-20 DIAGNOSIS — D696 Thrombocytopenia, unspecified: Secondary | ICD-10-CM

## 2011-07-20 DIAGNOSIS — R509 Fever, unspecified: Secondary | ICD-10-CM

## 2011-07-20 DIAGNOSIS — J9620 Acute and chronic respiratory failure, unspecified whether with hypoxia or hypercapnia: Secondary | ICD-10-CM

## 2011-07-20 LAB — ROCKY MTN SPOTTED FVR AB, IGG-BLOOD: RMSF IgG: 0.19 IV

## 2011-07-20 LAB — CBC
Platelets: 255 10*3/uL (ref 150–400)
RBC: 4.09 MIL/uL — ABNORMAL LOW (ref 4.22–5.81)
RDW: 12.3 % (ref 11.5–15.5)
WBC: 24.6 10*3/uL — ABNORMAL HIGH (ref 4.0–10.5)

## 2011-07-20 LAB — EHRLICHIA ANTIBODY PANEL
E chaffeensis (HGE) Ab, IgG: NEGATIVE
E chaffeensis (HGE) Ab, IgM: NEGATIVE

## 2011-07-20 LAB — BASIC METABOLIC PANEL
CO2: 26 mEq/L (ref 19–32)
Calcium: 9.4 mg/dL (ref 8.4–10.5)
Chloride: 101 mEq/L (ref 96–112)
Creatinine, Ser: 0.59 mg/dL (ref 0.50–1.35)
GFR calc non Af Amer: >90 mL/min (ref >90)
Potassium: 4 mEq/L (ref 3.5–5.1)
Sodium: 137 mEq/L (ref 135–145)

## 2011-07-20 LAB — LEGIONELLA ANTIGEN, URINE: Legionella Antigen, Urine: NEGATIVE

## 2011-07-20 LAB — INFLUENZA PANEL BY PCR (TYPE A & B): H1N1 flu by pcr: NOT DETECTED

## 2011-07-20 MED ORDER — ALUM & MAG HYDROXIDE-SIMETH 200-200-20 MG/5ML PO SUSP
30.0000 mL | Freq: Four times a day (QID) | ORAL | Status: DC | PRN
  Administered 2011-07-20 – 2011-07-22 (×4): 30 mL via ORAL
  Filled 2011-07-20 (×4): qty 30

## 2011-07-20 MED ORDER — HYDROMORPHONE HCL PF 2 MG/ML IJ SOLN
1.5000 mg | INTRAMUSCULAR | Status: DC | PRN
  Filled 2011-07-20: qty 2

## 2011-07-20 MED ORDER — HYDROMORPHONE HCL PF 1 MG/ML IJ SOLN
1.5000 mg | INTRAMUSCULAR | Status: DC | PRN
  Administered 2011-07-20 – 2011-07-22 (×14): 1.5 mg via INTRAVENOUS
  Filled 2011-07-20 (×13): qty 2

## 2011-07-20 NOTE — Progress Notes (Signed)
Subjective: This man is still short of breath having pain from his right hip which apparently required surgery. He also has lower back pain. We are waiting antibody testing for Lyme disease. He is being treated for COPD exacerbation and pneumonia.           Physical Exam: Blood pressure 102/58, pulse 102, temperature 98.1 F (36.7 C), temperature source Oral, resp. rate 20, height 6\' 1"  (1.854 m), weight 77 kg (169 lb 12.1 oz), SpO2 95.00%. He looks chronically sick. Lung fields show bilateral wheezing, slightly tight. Heart sounds are present and normal. Alert and orientated. There is no clubbing.   Investigations:  Recent Results (from the past 240 hour(s))  CULTURE, BLOOD (ROUTINE X 2)     Status: Normal (Preliminary result)   Collection Time   07/18/11  3:05 PM      Component Value Range Status Comment   Specimen Description BLOOD LEFT ARM DRAWN BY RN   Final    Special Requests BOTTLES DRAWN AEROBIC AND ANAEROBIC 6CC   Final    Culture NO GROWTH 1 DAY   Final    Report Status PENDING   Incomplete   CULTURE, BLOOD (ROUTINE X 2)     Status: Normal (Preliminary result)   Collection Time   07/18/11  3:15 PM      Component Value Range Status Comment   Specimen Description BLOOD RIGHT ANTECUBITAL   Final    Special Requests BOTTLES DRAWN AEROBIC AND ANAEROBIC 12CC   Final    Culture NO GROWTH 1 DAY   Final    Report Status PENDING   Incomplete   MRSA PCR SCREENING     Status: Normal   Collection Time   07/18/11  7:09 PM      Component Value Range Status Comment   MRSA by PCR NEGATIVE  NEGATIVE  Final   CULTURE, EXPECTORATED SPUTUM-ASSESSMENT     Status: Normal   Collection Time   07/18/11  9:07 PM      Component Value Range Status Comment   Specimen Description SPUTUM   Final    Special Requests NONE   Final    Sputum evaluation     Final    Value: THIS SPECIMEN IS ACCEPTABLE. RESPIRATORY CULTURE REPORT TO FOLLOW.   Report Status 07/18/2011 FINAL   Final   CULTURE,  RESPIRATORY     Status: Normal (Preliminary result)   Collection Time   07/18/11  9:07 PM      Component Value Range Status Comment   Specimen Description SPUTUM EXPECTORATED   Final    Special Requests NONE   Final    Gram Stain     Final    Value: MODERATE WBC PRESENT, PREDOMINANTLY PMN     FEW SQUAMOUS EPITHELIAL CELLS PRESENT     NO ORGANISMS SEEN   Culture Culture reincubated for better growth   Final    Report Status PENDING   Incomplete      Basic Metabolic Panel:  [facility] 07/20/11 0614 07/19/11 0910  NA 137 137  K 4.0 4.2  CL 101 100  CO2 26 27  GLUCOSE 175* 213*  BUN 9 9  CREATININE 0.59 0.70  CALCIUM 9.4 9.8  MG -- --  PHOS -- --   Liver Function Tests:  Basename 07/18/11 1505  AST 16  ALT 13  ALKPHOS 123*  BILITOT 0.4  PROT 7.6  ALBUMIN 3.9     CBC:  Basename 07/20/11 0614 07/18/11 1505  WBC 24.6*  22.0*  NEUTROABS -- 19.1*  HGB 12.8* 15.0  HCT 39.0 44.6  MCV 95.4 94.3  PLT 255 259    Ct Angio Chest W/cm &/or Wo Cm  07/18/2011  *RADIOLOGY REPORT*  Clinical Data: Fever.  Shortness of breath.  Cough.  Tick bite 1 week ago.  COPD.  CT ANGIOGRAPHY CHEST  Technique:  Multidetector CT imaging of the chest using the standard protocol during bolus administration of intravenous contrast. Multiplanar reconstructed images including MIPs were obtained and reviewed to evaluate the vascular anatomy.  Contrast: OMNIPAQUE IOHEXOL 350 MG/ML SOLN  Comparison: 07/18/2011; 01/13/2009  Findings: No filling defect is identified in the pulmonary arterial tree to suggest pulmonary embolus.  Prominent main pulmonary arteries compatible with pulmonary arterial hypertension which is probably secondary to the markedly severe emphysema.  A subcarinal lymph node has a short axis diameter 1.1 cm, within normal limits.  The right hilar node has a short axis diameter 1.2 cm, mildly enlarged.  No cardiomegaly noted.  No pleural effusion identified.  Markedly severe emphysema is  present with very large bullae bilaterally, particularly in the upper lobes.  Bilateral reticulonodular interstitial opacity with a "tree in bud" configuration is most compatible with atypical infectious bronchiolitis and is new compared the prior exam.  The appearance is rather diffuse and could also reflect a viral process, but is not characteristic for typical bacterial pneumonia.  The bullae have significantly expanded since the prior CT scan, especially in the right lung.  Minimal subsegmental atelectasis is present in both posterior basal segments of the lower lobes.  IMPRESSION:  1. No filling defect is identified in the pulmonary arterial tree to suggest pulmonary embolus. 2.  Bilateral reticulonodular interstitial opacities new compared the prior CT scan from 2010, and probably reflects viral/atypical pneumonia or atypical infectious bronchiolitis. 3.  Markedly severe bullous lung disease, worsened compared to 2010. 4.  Mild right hilar adenopathy, most likely reactive. 5.  Enlarged main pulmonary artery compatible with secondary pulmonary arterial hypertension.  Original Report Authenticated By: Dellia Cloud, M.D.   Dg Chest Port 1 View  07/18/2011  *RADIOLOGY REPORT*  Clinical Data: Cough, body aches, difficulty breathing, recent tick exposure  PORTABLE CHEST - 1 VIEW  Comparison: Portable exam 1517 hours compared to 03/28/2010  Findings: Normal heart size, mediastinal contours, and pulmonary vascularity. Large bullae are seen in the upper lobes bilaterally left greater than right. Right upper lobe atelectasis adjacent to minor fissure. Mild bibasilar atelectasis. The accentuated markings and atelectasis in the mid-to-lower lungs appear stable since previous study and no definite acute infiltrate or pleural effusion is identified. No definite pneumothorax. Bones unremarkable.  IMPRESSION: Changes of COPD with extensive bullous disease in the upper lobes. Chronic bibasilar changes and  atelectasis.  Original Report Authenticated By: Lollie Marrow, M.D.      Medications:  Scheduled:   . albuterol  2.5 mg Nebulization Q4H  . cefTRIAXone (ROCEPHIN)  IV  1 g Intravenous Q24H  . doxycycline  100 mg Oral Q12H  . enoxaparin  40 mg Subcutaneous Q24H  . feeding supplement  237 mL Oral Daily  . Fluticasone-Salmeterol  1 puff Inhalation Q12H  . guaiFENesin  1,200 mg Oral BID  . methylPREDNISolone (SOLU-MEDROL) injection  60 mg Intravenous Q6H  . nicotine  21 mg Transdermal Q24H  . tiotropium  18 mcg Inhalation Daily    Impression: 1. COPD exacerbation. 2. Community-acquired atypical pneumonia. 3. Chronic leukocytosis. 4. Tobacco abuse. 5. Myalgia and fevers.  Plan: 1. Continue with current therapy. 2. Increase intravenous hydromorphone for pain relief.     LOS: 2 days   Wilson Singer Pager [phone]  07/20/2011, 11:01 AM

## 2011-07-21 ENCOUNTER — Inpatient Hospital Stay (HOSPITAL_COMMUNITY): Payer: Medicaid Other

## 2011-07-21 DIAGNOSIS — J9620 Acute and chronic respiratory failure, unspecified whether with hypoxia or hypercapnia: Secondary | ICD-10-CM

## 2011-07-21 DIAGNOSIS — J438 Other emphysema: Secondary | ICD-10-CM

## 2011-07-21 DIAGNOSIS — D696 Thrombocytopenia, unspecified: Secondary | ICD-10-CM

## 2011-07-21 DIAGNOSIS — R509 Fever, unspecified: Secondary | ICD-10-CM

## 2011-07-21 LAB — COMPREHENSIVE METABOLIC PANEL
Alkaline Phosphatase: 132 U/L — ABNORMAL HIGH (ref 39–117)
BUN: 10 mg/dL (ref 6–23)
GFR calc Af Amer: >90 mL/min (ref >90)
Glucose, Bld: 222 mg/dL — ABNORMAL HIGH (ref 70–99)
Potassium: 4 mEq/L (ref 3.5–5.1)
Total Bilirubin: 0.1 mg/dL — ABNORMAL LOW (ref 0.3–1.2)
Total Protein: 6.7 g/dL (ref 6.0–8.3)

## 2011-07-21 LAB — CBC
HCT: 38.5 % — ABNORMAL LOW (ref 39.0–52.0)
Hemoglobin: 12.6 g/dL — ABNORMAL LOW (ref 13.0–17.0)
MCH: 31.6 pg (ref 26.0–34.0)
MCHC: 32.7 g/dL (ref 30.0–36.0)

## 2011-07-21 LAB — CULTURE, RESPIRATORY W GRAM STAIN

## 2011-07-21 LAB — SEDIMENTATION RATE: Sed Rate: 50 mm/hr — ABNORMAL HIGH (ref 0–16)

## 2011-07-21 MED ORDER — PREDNISONE 20 MG PO TABS
40.0000 mg | ORAL_TABLET | Freq: Every day | ORAL | Status: DC
  Administered 2011-07-21 – 2011-07-24 (×4): 40 mg via ORAL
  Filled 2011-07-21 (×4): qty 2

## 2011-07-21 MED ORDER — SODIUM CHLORIDE 0.9 % IJ SOLN
INTRAMUSCULAR | Status: AC
  Administered 2011-07-21: 15:00:00
  Filled 2011-07-21: qty 3

## 2011-07-21 MED ORDER — SODIUM CHLORIDE 0.9 % IN NEBU
INHALATION_SOLUTION | RESPIRATORY_TRACT | Status: AC
  Administered 2011-07-21: 3 mL
  Filled 2011-07-21: qty 3

## 2011-07-21 MED ORDER — MOXIFLOXACIN HCL IN NACL 400 MG/250ML IV SOLN
400.0000 mg | INTRAVENOUS | Status: DC
  Administered 2011-07-21 – 2011-07-23 (×3): 400 mg via INTRAVENOUS
  Filled 2011-07-21 (×5): qty 250

## 2011-07-21 NOTE — Progress Notes (Signed)
Inpatient Diabetes Program Recommendations  AACE/ADA: New Consensus Statement on Inpatient Glycemic Control  Target Ranges:  Prepandial:   less than 140 mg/dL      Peak postprandial:   less than 180 mg/dL (1-2 hours)      Critically ill patients:  140 - 180 mg/dL  Pager:  [phone] Hours:  8 am-10pm   Reason for Visit: Elevated glucose:  222 mg/dL  Inpatient Diabetes Program Recommendations Correction (SSI): Add Novolog Correction while on IV steroids

## 2011-07-21 NOTE — Progress Notes (Signed)
Subjective: This man is still short of breath having pain from his right hip which apparently required surgery. He also has lower back pain.antibody testing for Lyme disease was negative. He is being treated for COPD exacerbation and pneumonia.           Physical Exam: Blood pressure 110/60, pulse 100, temperature 98 F (36.7 C), temperature source Oral, resp. rate 20, height 6\' 1"  (1.854 m), weight 77 kg (169 lb 12.1 oz), SpO2 95.00%. He looks chronically sick. Lung fields show bilateral wheezing, not tight today. Heart sounds are present and normal. Alert and orientated. There is no clubbing.   Investigations:  Recent Results (from the past 240 hour(s))  CULTURE, BLOOD (ROUTINE X 2)     Status: Normal (Preliminary result)   Collection Time   07/18/11  3:05 PM      Component Value Range Status Comment   Specimen Description BLOOD LEFT ARM DRAWN BY RN   Final    Special Requests BOTTLES DRAWN AEROBIC AND ANAEROBIC 6CC   Final    Culture NO GROWTH 2 DAYS   Final    Report Status PENDING   Incomplete   CULTURE, BLOOD (ROUTINE X 2)     Status: Normal (Preliminary result)   Collection Time   07/18/11  3:15 PM      Component Value Range Status Comment   Specimen Description BLOOD RIGHT ANTECUBITAL   Final    Special Requests BOTTLES DRAWN AEROBIC AND ANAEROBIC 12CC   Final    Culture NO GROWTH 2 DAYS   Final    Report Status PENDING   Incomplete   MRSA PCR SCREENING     Status: Normal   Collection Time   07/18/11  7:09 PM      Component Value Range Status Comment   MRSA by PCR NEGATIVE  NEGATIVE  Final   CULTURE, EXPECTORATED SPUTUM-ASSESSMENT     Status: Normal   Collection Time   07/18/11  9:07 PM      Component Value Range Status Comment   Specimen Description SPUTUM   Final    Special Requests NONE   Final    Sputum evaluation     Final    Value: THIS SPECIMEN IS ACCEPTABLE. RESPIRATORY CULTURE REPORT TO FOLLOW.   Report Status 07/18/2011 FINAL   Final   CULTURE,  RESPIRATORY     Status: Normal (Preliminary result)   Collection Time   07/18/11  9:07 PM      Component Value Range Status Comment   Specimen Description SPUTUM EXPECTORATED   Final    Special Requests NONE   Final    Gram Stain     Final    Value: MODERATE WBC PRESENT, PREDOMINANTLY PMN     FEW SQUAMOUS EPITHELIAL CELLS PRESENT     NO ORGANISMS SEEN   Culture Culture reincubated for better growth   Final    Report Status PENDING   Incomplete      Basic Metabolic Panel:  Basename 07/21/11 0417 07/20/11 0614  NA 138 137  K 4.0 4.0  CL 99 101  CO2 29 26  GLUCOSE 222* 175*  BUN 10 9  CREATININE 0.57 0.59  CALCIUM 9.4 9.4  MG -- --  PHOS -- --   Liver Function Tests:  Basename 07/21/11 0417 07/18/11 1505  AST 47* 16  ALT 57* 13  ALKPHOS 132* 123*  BILITOT 0.1* 0.4  PROT 6.7 7.6  ALBUMIN 3.0* 3.9     CBC:  Basename  07/21/11 0417 07/20/11 0614 07/18/11 1505  WBC 23.2* 24.6* --  NEUTROABS -- -- 19.1*  HGB 12.6* 12.8* --  HCT 38.5* 39.0 --  MCV 96.5 95.4 --  PLT 319 255 --        Medications:  Scheduled:    . albuterol  2.5 mg Nebulization Q4H  . cefTRIAXone (ROCEPHIN)  IV  1 g Intravenous Q24H  . doxycycline  100 mg Oral Q12H  . enoxaparin  40 mg Subcutaneous Q24H  . feeding supplement  237 mL Oral Daily  . Fluticasone-Salmeterol  1 puff Inhalation Q12H  . guaiFENesin  1,200 mg Oral BID  . nicotine  21 mg Transdermal Q24H  . predniSONE  40 mg Oral Q breakfast  . tiotropium  18 mcg Inhalation Daily  . DISCONTD: methylPREDNISolone (SOLU-MEDROL) injection  60 mg Intravenous Q6H    Impression: 1. COPD exacerbation. 2. Community-acquired atypical pneumonia. 3. Chronic leukocytosis. 4. Tobacco abuse.      Plan: 1. Continue with current therapy. 2. Discontinue IV steroids and start oral prednisone. 3. Add Avelox to his therapy.     LOS: 3 days   Wilson Singer Pager [phone]  07/21/2011, 11:30 AM

## 2011-07-21 NOTE — Progress Notes (Signed)
Edema noted to rt leg,patient c/o of pain when bending leg,when comparing the two legs there is a difference noted, Dr Karilyn Cota notified. Will continue to monitor patient.

## 2011-07-22 DIAGNOSIS — J438 Other emphysema: Secondary | ICD-10-CM

## 2011-07-22 DIAGNOSIS — D696 Thrombocytopenia, unspecified: Secondary | ICD-10-CM

## 2011-07-22 DIAGNOSIS — R509 Fever, unspecified: Secondary | ICD-10-CM

## 2011-07-22 DIAGNOSIS — J9620 Acute and chronic respiratory failure, unspecified whether with hypoxia or hypercapnia: Secondary | ICD-10-CM

## 2011-07-22 MED ORDER — SODIUM CHLORIDE 0.9 % IN NEBU
INHALATION_SOLUTION | RESPIRATORY_TRACT | Status: AC
  Filled 2011-07-22: qty 3

## 2011-07-22 MED ORDER — OXYCODONE HCL 5 MG PO TABS
30.0000 mg | ORAL_TABLET | ORAL | Status: DC
  Administered 2011-07-22 – 2011-07-24 (×14): 30 mg via ORAL
  Filled 2011-07-22 (×13): qty 6

## 2011-07-22 MED ORDER — POTASSIUM CHLORIDE CRYS ER 20 MEQ PO TBCR
40.0000 meq | EXTENDED_RELEASE_TABLET | Freq: Once | ORAL | Status: AC
  Administered 2011-07-22: 40 meq via ORAL
  Filled 2011-07-22: qty 1

## 2011-07-22 MED ORDER — SENNOSIDES-DOCUSATE SODIUM 8.6-50 MG PO TABS
1.0000 | ORAL_TABLET | Freq: Two times a day (BID) | ORAL | Status: DC
  Administered 2011-07-22 – 2011-07-24 (×5): 1 via ORAL
  Filled 2011-07-22 (×5): qty 1

## 2011-07-22 MED ORDER — FUROSEMIDE 10 MG/ML IJ SOLN
40.0000 mg | Freq: Two times a day (BID) | INTRAMUSCULAR | Status: DC
  Administered 2011-07-22 – 2011-07-23 (×3): 40 mg via INTRAVENOUS
  Filled 2011-07-22 (×3): qty 4

## 2011-07-22 MED ORDER — OXYCODONE HCL 5 MG PO TABS
ORAL_TABLET | ORAL | Status: AC
  Administered 2011-07-22: 30 mg via ORAL
  Filled 2011-07-22: qty 6

## 2011-07-22 NOTE — Consult Note (Signed)
Consult requested by: Dr. Karilyn Cota Consult requested for COPD:  HPI: This is a 46 year old with a long known history of multiple medical problems dominated by chronic pain. He also has COPD. When I asked him if he was smoking he said no and then changed his answered to the was not inhaling. He does have a significant smoking history. He has a previous diagnosis of COPD. It's hard to tell how limited he is by his breathing because of his severe problems with back and hip pain. He has developed some leg swelling since admission. He says he is coughing a little bit. He is not coughing anything up.  Past Medical History  Diagnosis Date  . COPD (chronic obstructive pulmonary disease)      History reviewed. No pertinent family history.   History   Social History  . Marital Status: Married    Spouse Name: N/A    Number of Children: N/A  . Years of Education: N/A   Social History Main Topics  . Smoking status: Current Everyday Smoker -- 1.0 packs/day  . Smokeless tobacco: None  . Alcohol Use: No  . Drug Use: No  . Sexually Active: Not Currently   Other Topics Concern  . None   Social History Narrative  . None     ROS: He denies any hemoptysis. He has not had chest pain. He is short of breath with exertion.    Objective: Vital signs in last 24 hours: Temp:  [97.7 F (36.5 C)-97.9 F (36.6 C)] 97.9 F (36.6 C) (05/18 0538) Pulse Rate:  [76-114] 76  (05/18 0538) Resp:  [18] 18  (05/18 0538) BP: (97-108)/(47-67) 108/47 mmHg (05/18 0538) SpO2:  [91 %-99 %] 98 % (05/18 0755) Weight change:  Last BM Date: 07/17/11  Intake/Output from previous day: 05/17 0701 - 05/18 0700 In: 3265.8 [P.O.:1120; I.V.:1845.8; IV Piggyback:300] Out: 5325 [Urine:5325]  PHYSICAL EXAM He is awake and alert and does not appear to be in acute distress. His pupils are reactive. His mucous membranes are moist. His neck is supple. His chest shows rhonchi bilaterally and prolonged expiration. His heart  is regular. His abdomen is soft and he does have 1+ edema bilaterally in his legs central nervous system examination is grossly intact  Lab Results: Basic Metabolic Panel:  Basename 07/21/11 0417 07/20/11 0614  NA 138 137  K 4.0 4.0  CL 99 101  CO2 29 26  GLUCOSE 222* 175*  BUN 10 9  CREATININE 0.57 0.59  CALCIUM 9.4 9.4  MG -- --  PHOS -- --   Liver Function Tests:  [facility] 07/21/11 0417  AST 47*  ALT 57*  ALKPHOS 132*  BILITOT 0.1*  PROT 6.7  ALBUMIN 3.0*   No results found for this basename: LIPASE:2,AMYLASE:2 in the last 72 hours No results found for this basename: AMMONIA:2 in the last 72 hours CBC:  Basename 07/21/11 0417 07/20/11 0614  WBC 23.2* 24.6*  NEUTROABS -- --  HGB 12.6* 12.8*  HCT 38.5* 39.0  MCV 96.5 95.4  PLT 319 255   Cardiac Enzymes:  Basename 07/19/11 0910  CKTOTAL 69  CKMB 3.5  CKMBINDEX --  TROPONINI <0.30   BNP: No results found for this basename: PROBNP:3 in the last 72 hours D-Dimer: No results found for this basename: DDIMER:2 in the last 72 hours CBG: No results found for this basename: GLUCAP:6 in the last 72 hours Hemoglobin A1C: No results found for this basename: HGBA1C in the last 72 hours Fasting Lipid Panel:  No results found for this basename: CHOL,HDL,LDLCALC,TRIG,CHOLHDL,LDLDIRECT in the last 72 hours Thyroid Function Tests: No results found for this basename: TSH,T4TOTAL,FREET4,T3FREE,THYROIDAB in the last 72 hours Anemia Panel: No results found for this basename: VITAMINB12,FOLATE,FERRITIN,TIBC,IRON,RETICCTPCT in the last 72 hours Coagulation: No results found for this basename: LABPROT:2,INR:2 in the last 72 hours Urine Drug Screen: Drugs of Abuse  No results found for this basename: labopia, cocainscrnur, labbenz, amphetmu, thcu, labbarb    Alcohol Level: No results found for this basename: ETH:2 in the last 72 hours Urinalysis: No results found for this basename:  COLORURINE:2,APPERANCEUR:2,LABSPEC:2,PHURINE:2,GLUCOSEU:2,HGBUR:2,BILIRUBINUR:2,KETONESUR:2,PROTEINUR:2,UROBILINOGEN:2,NITRITE:2,LEUKOCYTESUR:2 in the last 72 hours Misc. Labs:   ABGS: No results found for this basename: PHART,PCO2,PO2ART,TCO2,HCO3 in the last 72 hours   MICROBIOLOGY: Recent Results (from the past 240 hour(s))  CULTURE, BLOOD (ROUTINE X 2)     Status: Normal (Preliminary result)   Collection Time   07/18/11  3:05 PM      Component Value Range Status Comment   Specimen Description BLOOD LEFT ARM DRAWN BY RN   Final    Special Requests BOTTLES DRAWN AEROBIC AND ANAEROBIC 6CC   Final    Culture NO GROWTH 2 DAYS   Final    Report Status PENDING   Incomplete   CULTURE, BLOOD (ROUTINE X 2)     Status: Normal (Preliminary result)   Collection Time   07/18/11  3:15 PM      Component Value Range Status Comment   Specimen Description BLOOD RIGHT ANTECUBITAL   Final    Special Requests BOTTLES DRAWN AEROBIC AND ANAEROBIC 12CC   Final    Culture NO GROWTH 2 DAYS   Final    Report Status PENDING   Incomplete   MRSA PCR SCREENING     Status: Normal   Collection Time   07/18/11  7:09 PM      Component Value Range Status Comment   MRSA by PCR NEGATIVE  NEGATIVE  Final   CULTURE, EXPECTORATED SPUTUM-ASSESSMENT     Status: Normal   Collection Time   07/18/11  9:07 PM      Component Value Range Status Comment   Specimen Description SPUTUM   Final    Special Requests NONE   Final    Sputum evaluation     Final    Value: THIS SPECIMEN IS ACCEPTABLE. RESPIRATORY CULTURE REPORT TO FOLLOW.   Report Status 07/18/2011 FINAL   Final   CULTURE, RESPIRATORY     Status: Normal   Collection Time   07/18/11  9:07 PM      Component Value Range Status Comment   Specimen Description SPUTUM EXPECTORATED   Final    Special Requests NONE   Final    Gram Stain     Final    Value: MODERATE WBC PRESENT, PREDOMINANTLY PMN     FEW SQUAMOUS EPITHELIAL CELLS PRESENT     NO ORGANISMS SEEN   Culture      Final    Value: MODERATE HAEMOPHILUS INFLUENZAE     Note: BETA LACTAMASE NEGATIVE   Report Status 07/21/2011 FINAL   Final     Studies/Results: US Venous Img Lower Unilateral Right  07/21/2011  *RADIOLOGY REPORT*  Clinical Data: ?DVT R  LEG;;  RIGHT LOWER EXTREMITY VENOUS DUPLEX ULTRASOUND  Technique: Gray-scale sonography with compression, as well as color and duplex ultrasound, were performed to evaluate the deep venous system from the level of the common femoral vein through the popliteal and proximal calf veins.  Comparison: None  Findings:  Normal compressibility and normal Doppler signal within the common femoral, superficial femoral and popliteal veins, down to the proximal calf veins.  No grayscale filling defects to suggest DVT.  IMPRESSION: No evidence of right lower extremity deep vein thrombosis.  Original Report Authenticated By: Cyndie Chime, M.D.    Medications:  Prior to Admission:  Prescriptions prior to admission  Medication Sig Dispense Refill  . albuterol (PROVENTIL HFA;VENTOLIN HFA) 108 (90 BASE) MCG/ACT inhaler Inhale 2 puffs into the lungs every 6 (six) hours as needed. Shortness of breath      . Fluticasone-Salmeterol (ADVAIR) 500-50 MCG/DOSE AEPB Inhale 1 puff into the lungs every 12 (twelve) hours.      Marland Kitchen oxycodone (ROXICODONE) 30 MG immediate release tablet Take 30 mg by mouth 4 (four) times daily.      Marland Kitchen tiotropium (SPIRIVA) 18 MCG inhalation capsule Place 18 mcg into inhaler and inhale daily.       Scheduled:   . albuterol  2.5 mg Nebulization Q4H  . cefTRIAXone (ROCEPHIN)  IV  1 g Intravenous Q24H  . enoxaparin  40 mg Subcutaneous Q24H  . feeding supplement  237 mL Oral Daily  . Fluticasone-Salmeterol  1 puff Inhalation Q12H  . furosemide  40 mg Intravenous Q12H  . guaiFENesin  1,200 mg Oral BID  . moxifloxacin  400 mg Intravenous Q24H  . nicotine  21 mg Transdermal Q24H  . oxyCODONE      . oxyCODONE  30 mg Oral Q4H  . predniSONE  40 mg Oral Q  breakfast  . sodium chloride      . sodium chloride      . sodium chloride      . tiotropium  18 mcg Inhalation Daily  . DISCONTD: doxycycline  100 mg Oral Q12H  . DISCONTD: methylPREDNISolone (SOLU-MEDROL) injection  60 mg Intravenous Q6H   Continuous:   . DISCONTD: sodium chloride 50 mL/hr at 07/22/11 0451   ZOX:[id number], acetaminophen, albuterol, alum & mag hydroxide-simeth, ondansetron (ZOFRAN) IV, ondansetron, DISCONTD:  HYDROmorphone (DILAUDID) injection, DISCONTD: oxycodone  Assesment: He has severe COPD. He was admitted with fever and myalgias. He has chronic pain syndrome. He has some other findings on his chest x-ray some of which may be related to volume overload. Active Problems:  Fever  Myalgia  Leukocytosis  COPD (chronic obstructive pulmonary disease)  Acute-on-chronic respiratory failure  Tachycardia  Dehydration  Chronic pain  Tobacco abuse  Pneumonia    Plan: I think he is on excellent treatment at this point for his COPD. He does not have insurance but the addition of Daliresp may be reasonable and he may be able to get it from the company. I offered to see him about his COPD but he says he's not going to be able to see me about COPD and then someone else about his pain. I do not want to take on his pain management    LOS: 4 days   Margareth Kanner L 07/22/2011, 8:46 AM

## 2011-07-22 NOTE — Progress Notes (Signed)
Subjective: This man is still short of breath having pain from his right hip which apparently required surgery. He also has lower back pain.antibody testing for Lyme disease was negative. He is being treated for COPD exacerbation and pneumonia. This morning he appears he had wanted his pain medications and was trying to convince the nurse into giving him more. He also complains of swelling of both his legs now.           Physical Exam: Blood pressure 108/47, pulse 76, temperature 97.9 F (36.6 C), temperature source Oral, resp. rate 18, height 6\' 1"  (1.854 m), weight 77 kg (169 lb 12.1 oz), SpO2 96.00%. He looks chronically sick. Lung fields show bilateral wheezing, not tight today. Heart sounds are present and normal. Alert and orientated. There is no clubbing. He does have bilateral leg swelling which is from the knee downwards with pitting edema.   Investigations:  Recent Results (from the past 240 hour(s))  CULTURE, BLOOD (ROUTINE X 2)     Status: Normal (Preliminary result)   Collection Time   07/18/11  3:05 PM      Component Value Range Status Comment   Specimen Description BLOOD LEFT ARM DRAWN BY RN   Final    Special Requests BOTTLES DRAWN AEROBIC AND ANAEROBIC 6CC   Final    Culture NO GROWTH 2 DAYS   Final    Report Status PENDING   Incomplete   CULTURE, BLOOD (ROUTINE X 2)     Status: Normal (Preliminary result)   Collection Time   07/18/11  3:15 PM      Component Value Range Status Comment   Specimen Description BLOOD RIGHT ANTECUBITAL   Final    Special Requests BOTTLES DRAWN AEROBIC AND ANAEROBIC 12CC   Final    Culture NO GROWTH 2 DAYS   Final    Report Status PENDING   Incomplete   MRSA PCR SCREENING     Status: Normal   Collection Time   07/18/11  7:09 PM      Component Value Range Status Comment   MRSA by PCR NEGATIVE  NEGATIVE  Final   CULTURE, EXPECTORATED SPUTUM-ASSESSMENT     Status: Normal   Collection Time   07/18/11  9:07 PM      Component Value  Range Status Comment   Specimen Description SPUTUM   Final    Special Requests NONE   Final    Sputum evaluation     Final    Value: THIS SPECIMEN IS ACCEPTABLE. RESPIRATORY CULTURE REPORT TO FOLLOW.   Report Status 07/18/2011 FINAL   Final   CULTURE, RESPIRATORY     Status: Normal   Collection Time   07/18/11  9:07 PM      Component Value Range Status Comment   Specimen Description SPUTUM EXPECTORATED   Final    Special Requests NONE   Final    Gram Stain     Final    Value: MODERATE WBC PRESENT, PREDOMINANTLY PMN     FEW SQUAMOUS EPITHELIAL CELLS PRESENT     NO ORGANISMS SEEN   Culture     Final    Value: MODERATE HAEMOPHILUS INFLUENZAE     Note: BETA LACTAMASE NEGATIVE   Report Status 07/21/2011 FINAL   Final      Basic Metabolic Panel:  Basename 07/21/11 0417 07/20/11 0614  NA 138 137  K 4.0 4.0  CL 99 101  CO2 29 26  GLUCOSE 222* 175*  BUN 10 9  CREATININE  0.57 0.59  CALCIUM 9.4 9.4  MG -- --  PHOS -- --   Liver Function Tests:  [facility] 07/21/11 0417  AST 47*  ALT 57*  ALKPHOS 132*  BILITOT 0.1*  PROT 6.7  ALBUMIN 3.0*     CBC:  Basename 07/21/11 0417 07/20/11 0614  WBC 23.2* 24.6*  NEUTROABS -- --  HGB 12.6* 12.8*  HCT 38.5* 39.0  MCV 96.5 95.4  PLT 319 255        Medications:  Scheduled:    . albuterol  2.5 mg Nebulization Q4H  . cefTRIAXone (ROCEPHIN)  IV  1 g Intravenous Q24H  . enoxaparin  40 mg Subcutaneous Q24H  . feeding supplement  237 mL Oral Daily  . Fluticasone-Salmeterol  1 puff Inhalation Q12H  . furosemide  40 mg Intravenous Q12H  . guaiFENesin  1,200 mg Oral BID  . moxifloxacin  400 mg Intravenous Q24H  . nicotine  21 mg Transdermal Q24H  . oxyCODONE  30 mg Oral Q4H  . predniSONE  40 mg Oral Q breakfast  . sodium chloride      . sodium chloride      . sodium chloride      . tiotropium  18 mcg Inhalation Daily  . DISCONTD: doxycycline  100 mg Oral Q12H  . DISCONTD: methylPREDNISolone (SOLU-MEDROL) injection  60 mg  Intravenous Q6H    Impression: 1. COPD exacerbation. 2. Community-acquired atypical pneumonia. 3. Chronic leukocytosis. 4. Tobacco abuse. 5. Bilateral leg swelling,? Right heart failure. 6. Possible drug-seeking behavior.      Plan: 1. Start IV Lasix. 2. Discontinue all when necessary opioids. Start scheduled oxycodone 30 mg every 4 hours. 3. Await pulmonary consultation.     LOS: 4 days   Wilson Singer Pager [phone]  07/22/2011, 7:54 AM

## 2011-07-22 NOTE — Progress Notes (Signed)
Spoke with patient about pain management. The patient decided to stay on meds already taking

## 2011-07-22 NOTE — Progress Notes (Signed)
I believe the patients pain medication regimen needs to be looked over. Pt is getting 30mg  Oxy IR every 4 hours PRN and 1.5mg  Dilaudid every 3 hours PRN but pt is taking both medications within the hour of each other not even waiting to see if the other medication will help with the pain. He does not seem to be in excruciating pain like he says that he is and has asked myself and another nurse on first shift to give him more of the pain medication than he has ordered. He told me earlier in the night as well as the first shift nurse "Don't waste the medication, just push it on in there. No one will know."

## 2011-07-23 ENCOUNTER — Inpatient Hospital Stay (HOSPITAL_COMMUNITY): Payer: Medicaid Other

## 2011-07-23 DIAGNOSIS — D696 Thrombocytopenia, unspecified: Secondary | ICD-10-CM

## 2011-07-23 DIAGNOSIS — J438 Other emphysema: Secondary | ICD-10-CM

## 2011-07-23 DIAGNOSIS — J9620 Acute and chronic respiratory failure, unspecified whether with hypoxia or hypercapnia: Secondary | ICD-10-CM

## 2011-07-23 LAB — BASIC METABOLIC PANEL
BUN: 12 mg/dL (ref 6–23)
Calcium: 9.5 mg/dL (ref 8.4–10.5)
Creatinine, Ser: 0.67 mg/dL (ref 0.50–1.35)
GFR calc Af Amer: >90 mL/min (ref >90)
GFR calc non Af Amer: >90 mL/min (ref >90)
Potassium: 3.9 mEq/L (ref 3.5–5.1)

## 2011-07-23 LAB — CULTURE, BLOOD (ROUTINE X 2): Culture: NO GROWTH

## 2011-07-23 MED ORDER — FUROSEMIDE 10 MG/ML IJ SOLN
80.0000 mg | Freq: Two times a day (BID) | INTRAMUSCULAR | Status: DC
  Administered 2011-07-23 – 2011-07-24 (×2): 80 mg via INTRAVENOUS
  Filled 2011-07-23 (×3): qty 8

## 2011-07-23 NOTE — Progress Notes (Signed)
Nurse did not notice is physical signs of pain.  Patient stated that his pain is more than 10 and with medication, it is an eight.

## 2011-07-23 NOTE — Progress Notes (Signed)
Subjective: This man is still short of breath having pain from his right hip which apparently required surgery. He also has lower back pain.antibody testing for Lyme disease was negative. He is being treated for COPD exacerbation and pneumonia. Yesterday, I discontinued when necessary opioids and started him on scheduled opiates. We didn't discuss his pain today. He tells me that his breathing is better. His legs are still swollen.           Physical Exam: Blood pressure 110/69, pulse 114, temperature 97.9 F (36.6 C), temperature source Oral, resp. rate 18, height 6\' 1"  (1.854 m), weight 77 kg (169 lb 12.1 oz), SpO2 97.00%. He looks chronically sick. Lung fields are now clear today. Heart sounds are present and normal. Alert and orientated. There is no clubbing. He does have bilateral leg swelling which is from the knee downwards with pitting edema, and has not improved significantly.   Investigations:  Recent Results (from the past 240 hour(s))  CULTURE, BLOOD (ROUTINE X 2)     Status: Normal (Preliminary result)   Collection Time   07/18/11  3:05 PM      Component Value Range Status Comment   Specimen Description BLOOD LEFT ARM DRAWN BY RN   Final    Special Requests BOTTLES DRAWN AEROBIC AND ANAEROBIC 6CC   Final    Culture NO GROWTH 4 DAYS   Final    Report Status PENDING   Incomplete   CULTURE, BLOOD (ROUTINE X 2)     Status: Normal (Preliminary result)   Collection Time   07/18/11  3:15 PM      Component Value Range Status Comment   Specimen Description BLOOD RIGHT ANTECUBITAL   Final    Special Requests BOTTLES DRAWN AEROBIC AND ANAEROBIC 12CC   Final    Culture NO GROWTH 4 DAYS   Final    Report Status PENDING   Incomplete   MRSA PCR SCREENING     Status: Normal   Collection Time   07/18/11  7:09 PM      Component Value Range Status Comment   MRSA by PCR NEGATIVE  NEGATIVE  Final   CULTURE, EXPECTORATED SPUTUM-ASSESSMENT     Status: Normal   Collection Time   07/18/11  9:07 PM      Component Value Range Status Comment   Specimen Description SPUTUM   Final    Special Requests NONE   Final    Sputum evaluation     Final    Value: THIS SPECIMEN IS ACCEPTABLE. RESPIRATORY CULTURE REPORT TO FOLLOW.   Report Status 07/18/2011 FINAL   Final   CULTURE, RESPIRATORY     Status: Normal   Collection Time   07/18/11  9:07 PM      Component Value Range Status Comment   Specimen Description SPUTUM EXPECTORATED   Final    Special Requests NONE   Final    Gram Stain     Final    Value: MODERATE WBC PRESENT, PREDOMINANTLY PMN     FEW SQUAMOUS EPITHELIAL CELLS PRESENT     NO ORGANISMS SEEN   Culture     Final    Value: MODERATE HAEMOPHILUS INFLUENZAE     Note: BETA LACTAMASE NEGATIVE   Report Status 07/21/2011 FINAL   Final      Basic Metabolic Panel:  Basename 07/23/11 0500 07/21/11 0417  NA 138 138  K 3.9 4.0  CL 94* 99  CO2 36* 29  GLUCOSE 89 222*  BUN 12 10  CREATININE 0.67 0.57  CALCIUM 9.5 9.4  MG -- --  PHOS -- --   Liver Function Tests:  [facility] 07/21/11 0417  AST 47*  ALT 57*  ALKPHOS 132*  BILITOT 0.1*  PROT 6.7  ALBUMIN 3.0*     CBC:  Basename 07/21/11 0417  WBC 23.2*  NEUTROABS --  HGB 12.6*  HCT 38.5*  MCV 96.5  PLT 319        Medications:  Scheduled:    . albuterol  2.5 mg Nebulization Q4H  . cefTRIAXone (ROCEPHIN)  IV  1 g Intravenous Q24H  . enoxaparin  40 mg Subcutaneous Q24H  . feeding supplement  237 mL Oral Daily  . Fluticasone-Salmeterol  1 puff Inhalation Q12H  . furosemide  80 mg Intravenous Q12H  . guaiFENesin  1,200 mg Oral BID  . moxifloxacin  400 mg Intravenous Q24H  . nicotine  21 mg Transdermal Q24H  . oxyCODONE  30 mg Oral Q4H  . potassium chloride  40 mEq Oral Once  . predniSONE  40 mg Oral Q breakfast  . senna-docusate  1 tablet Oral BID  . sodium chloride      . tiotropium  18 mcg Inhalation Daily  . DISCONTD: furosemide  40 mg Intravenous Q12H    Impression: 1. COPD  exacerbation. 2. Community-acquired atypical pneumonia. 3. Chronic leukocytosis. 4. Tobacco abuse. 5. Bilateral leg swelling,? Right heart failure. 6. Possible drug-seeking behavior.      Plan: 1. Increase Lasix IV to 80 mg twice a day. 2. Repeat chest x-ray today. 3. Echocardiogram to assess for pulmonary hypertension.     LOS: 5 days   Wilson Singer Pager [phone]  07/23/2011, 9:53 AM

## 2011-07-24 DIAGNOSIS — I270 Primary pulmonary hypertension: Secondary | ICD-10-CM

## 2011-07-24 DIAGNOSIS — D696 Thrombocytopenia, unspecified: Secondary | ICD-10-CM

## 2011-07-24 DIAGNOSIS — J9620 Acute and chronic respiratory failure, unspecified whether with hypoxia or hypercapnia: Secondary | ICD-10-CM

## 2011-07-24 DIAGNOSIS — J438 Other emphysema: Secondary | ICD-10-CM

## 2011-07-24 LAB — COMPREHENSIVE METABOLIC PANEL
AST: 22 U/L (ref 0–37)
BUN: 18 mg/dL (ref 6–23)
Chloride: 94 mEq/L — ABNORMAL LOW (ref 96–112)
GFR calc Af Amer: >90 mL/min (ref >90)
GFR calc non Af Amer: >90 mL/min (ref >90)
Glucose, Bld: 85 mg/dL (ref 70–99)
Potassium: 4.3 mEq/L (ref 3.5–5.1)
Sodium: 138 mEq/L (ref 135–145)
Total Protein: 6.8 g/dL (ref 6.0–8.3)

## 2011-07-24 LAB — CBC
HCT: 44.7 % (ref 39.0–52.0)
Hemoglobin: 14.7 g/dL (ref 13.0–17.0)
RDW: 12.7 % (ref 11.5–15.5)
WBC: 22.5 10*3/uL — ABNORMAL HIGH (ref 4.0–10.5)

## 2011-07-24 MED ORDER — GUAIFENESIN ER 600 MG PO TB12: 1200.0000 mg | ORAL_TABLET | Freq: Two times a day (BID) | ORAL | Status: DC

## 2011-07-24 MED ORDER — LEVOFLOXACIN 500 MG PO TABS: 500.0000 mg | ORAL_TABLET | Freq: Every day | ORAL | Status: AC

## 2011-07-24 MED ORDER — HYDROMORPHONE HCL PF 1 MG/ML IJ SOLN
1.0000 mg | Freq: Once | INTRAMUSCULAR | Status: AC
  Administered 2011-07-24: 1 mg via INTRAVENOUS
  Filled 2011-07-24: qty 1

## 2011-07-24 MED ORDER — TIOTROPIUM BROMIDE MONOHYDRATE 18 MCG IN CAPS: 18.0000 ug | ORAL_CAPSULE | Freq: Every day | RESPIRATORY_TRACT | Status: DC

## 2011-07-24 MED ORDER — PREDNISONE 20 MG PO TABS: ORAL_TABLET | ORAL | Status: DC

## 2011-07-24 MED ORDER — SODIUM CHLORIDE 0.9 % IJ SOLN
INTRAMUSCULAR | Status: AC
  Administered 2011-07-24: 10 mL
  Filled 2011-07-24: qty 3

## 2011-07-24 MED ORDER — FUROSEMIDE 80 MG PO TABS: 80.0000 mg | ORAL_TABLET | Freq: Two times a day (BID) | ORAL | Status: DC

## 2011-07-24 MED ORDER — NICOTINE 21 MG/24HR TD PT24: 1.0000 | MEDICATED_PATCH | TRANSDERMAL | Status: AC

## 2011-07-24 MED ORDER — FLUTICASONE-SALMETEROL 500-50 MCG/DOSE IN AEPB: 1.0000 | INHALATION_SPRAY | Freq: Two times a day (BID) | RESPIRATORY_TRACT | Status: DC

## 2011-07-24 NOTE — Progress Notes (Signed)
*  PRELIMINARY RESULTS* Echocardiogram 2D Echocardiogram has been performed.  Caswell Corwin 07/24/2011, 9:58 AM

## 2011-07-24 NOTE — Discharge Summary (Signed)
Physician Discharge Summary  Patient ID: Wesley [name] MRN: [id number] DOB/AGE: 1965/11/05 46 y.o. Primary Care Physician:Dr Fusco Admit date: 07/18/2011 Discharge date: 07/24/2011    Discharge Diagnoses:  1. Exacerbation of end-stage COPD. 2. Bronchopneumonia, community acquired. 3. Chronic pain syndrome. 4. Ongoing tobacco abuse.    Medication List  As of 07/24/2011 10:09 AM   TAKE these medications         albuterol 108 (90 BASE) MCG/ACT inhaler   Commonly known as: PROVENTIL HFA;VENTOLIN HFA   Inhale 2 puffs into the lungs every 6 (six) hours as needed. Shortness of breath      Fluticasone-Salmeterol 500-50 MCG/DOSE Aepb   Commonly known as: ADVAIR   Inhale 1 puff into the lungs every 12 (twelve) hours.      furosemide 80 MG tablet   Commonly known as: LASIX   Take 1 tablet (80 mg total) by mouth 2 (two) times daily.      guaiFENesin 600 MG 12 hr tablet   Commonly known as: MUCINEX   Take 2 tablets (1,200 mg total) by mouth 2 (two) times daily.      levofloxacin 500 MG tablet   Commonly known as: LEVAQUIN   Take 1 tablet (500 mg total) by mouth daily.      nicotine 21 mg/24hr patch   Commonly known as: NICODERM CQ - dosed in mg/24 hours   Place 1 patch onto the skin daily.      oxycodone 30 MG immediate release tablet   Commonly known as: ROXICODONE   Take 30 mg by mouth 4 (four) times daily.      predniSONE 20 MG tablet   Commonly known as: DELTASONE   Take 2 tablets daily for 3 days, then 1 tablet daily for 3 days, then half tablet daily for 3 days, then STOP.      tiotropium 18 MCG inhalation capsule   Commonly known as: SPIRIVA   Place 1 capsule (18 mcg total) into inhaler and inhale daily.            Discharged Condition: Stable.    Consults: Pulmonology, Dr. [name].  Significant Diagnostic Studies: Dg Chest 2 View  07/23/2011  *RADIOLOGY REPORT*  Clinical Data: COPD.  Evaluate for potential heart failure.  CHEST - 2 VIEW  Comparison:  Chest x-ray 07/18/2011.  Findings: Again noted are extensive bullous changes throughout the lung apices bilaterally.  There are coarsened interstitial markings throughout the mid and lower lungs bilaterally, likely to reflect the findings of presumed bronchopneumonia in the lungs as better demonstrated on recent chest CT 07/18/2011.  No definite evidence of edema.  No definite pleural effusions.  Heart size is normal. Mediastinal contours are unremarkable.  IMPRESSION: 1.  Overall, the appearance of the chest is similar to recent prior studies, as above.  Original Report Authenticated By: Florencia Reasons, M.D.   Ct Angio Chest W/cm &/or Wo Cm  07/18/2011  *RADIOLOGY REPORT*  Clinical Data: Fever.  Shortness of breath.  Cough.  Tick bite 1 week ago.  COPD.  CT ANGIOGRAPHY CHEST  Technique:  Multidetector CT imaging of the chest using the standard protocol during bolus administration of intravenous contrast. Multiplanar reconstructed images including MIPs were obtained and reviewed to evaluate the vascular anatomy.  Contrast: OMNIPAQUE IOHEXOL 350 MG/ML SOLN  Comparison: 07/18/2011; 01/13/2009  Findings: No filling defect is identified in the pulmonary arterial tree to suggest pulmonary embolus.  Prominent main pulmonary arteries compatible with pulmonary arterial hypertension which is probably  secondary to the markedly severe emphysema.  A subcarinal lymph node has a short axis diameter 1.1 cm, within normal limits.  The right hilar node has a short axis diameter 1.2 cm, mildly enlarged.  No cardiomegaly noted.  No pleural effusion identified.  Markedly severe emphysema is present with very large bullae bilaterally, particularly in the upper lobes.  Bilateral reticulonodular interstitial opacity with a "tree in bud" configuration is most compatible with atypical infectious bronchiolitis and is new compared the prior exam.  The appearance is rather diffuse and could also reflect a viral process, but is not  characteristic for typical bacterial pneumonia.  The bullae have significantly expanded since the prior CT scan, especially in the right lung.  Minimal subsegmental atelectasis is present in both posterior basal segments of the lower lobes.  IMPRESSION:  1. No filling defect is identified in the pulmonary arterial tree to suggest pulmonary embolus. 2.  Bilateral reticulonodular interstitial opacities new compared the prior CT scan from 2010, and probably reflects viral/atypical pneumonia or atypical infectious bronchiolitis. 3.  Markedly severe bullous lung disease, worsened compared to 2010. 4.  Mild right hilar adenopathy, most likely reactive. 5.  Enlarged main pulmonary artery compatible with secondary pulmonary arterial hypertension.  Original Report Authenticated By: Dellia Cloud, M.D.   US Venous Img Lower Unilateral Right  07/21/2011  *RADIOLOGY REPORT*  Clinical Data: ?DVT R  LEG;;  RIGHT LOWER EXTREMITY VENOUS DUPLEX ULTRASOUND  Technique: Gray-scale sonography with compression, as well as color and duplex ultrasound, were performed to evaluate the deep venous system from the level of the common femoral vein through the popliteal and proximal calf veins.  Comparison: None  Findings:  Normal compressibility and normal Doppler signal within the common femoral, superficial femoral and popliteal veins, down to the proximal calf veins.  No grayscale filling defects to suggest DVT.  IMPRESSION: No evidence of right lower extremity deep vein thrombosis.  Original Report Authenticated By: Cyndie Chime, M.D.   Dg Chest Port 1 View  07/18/2011  *RADIOLOGY REPORT*  Clinical Data: Cough, body aches, difficulty breathing, recent tick exposure  PORTABLE CHEST - 1 VIEW  Comparison: Portable exam 1517 hours compared to 03/28/2010  Findings: Normal heart size, mediastinal contours, and pulmonary vascularity. Large bullae are seen in the upper lobes bilaterally left greater than right. Right upper lobe  atelectasis adjacent to minor fissure. Mild bibasilar atelectasis. The accentuated markings and atelectasis in the mid-to-lower lungs appear stable since previous study and no definite acute infiltrate or pleural effusion is identified. No definite pneumothorax. Bones unremarkable.  IMPRESSION: Changes of COPD with extensive bullous disease in the upper lobes. Chronic bibasilar changes and atelectasis.  Original Report Authenticated By: Lollie Marrow, M.D.    Lab Results: Basic Metabolic Panel:  Basename 07/24/11 0557 07/23/11 0500  NA 138 138  K 4.3 3.9  CL 94* 94*  CO2 36* 36*  GLUCOSE 85 89  BUN 18 12  CREATININE 0.75 0.67  CALCIUM 9.7 9.5  MG -- --  PHOS -- --   Liver Function Tests:  [facility] 07/24/11 0557  AST 22  ALT 104*  ALKPHOS 98  BILITOT 0.1*  PROT 6.8  ALBUMIN 3.0*     CBC:  Basename 07/24/11 0557  WBC 22.5*  NEUTROABS --  HGB 14.7  HCT 44.7  MCV 95.7  PLT 354    Recent Results (from the past 240 hour(s))  CULTURE, BLOOD (ROUTINE X 2)     Status: Normal   Collection  Time   07/18/11  3:05 PM      Component Value Range Status Comment   Specimen Description BLOOD LEFT ARM DRAWN BY RN   Final    Special Requests BOTTLES DRAWN AEROBIC AND ANAEROBIC 6CC   Final    Culture NO GROWTH 5 DAYS   Final    Report Status 07/23/2011 FINAL   Final   CULTURE, BLOOD (ROUTINE X 2)     Status: Normal   Collection Time   07/18/11  3:15 PM      Component Value Range Status Comment   Specimen Description BLOOD RIGHT ANTECUBITAL   Final    Special Requests BOTTLES DRAWN AEROBIC AND ANAEROBIC 12CC   Final    Culture NO GROWTH 5 DAYS   Final    Report Status 07/23/2011 FINAL   Final   MRSA PCR SCREENING     Status: Normal   Collection Time   07/18/11  7:09 PM      Component Value Range Status Comment   MRSA by PCR NEGATIVE  NEGATIVE  Final   CULTURE, EXPECTORATED SPUTUM-ASSESSMENT     Status: Normal   Collection Time   07/18/11  9:07 PM      Component Value Range  Status Comment   Specimen Description SPUTUM   Final    Special Requests NONE   Final    Sputum evaluation     Final    Value: THIS SPECIMEN IS ACCEPTABLE. RESPIRATORY CULTURE REPORT TO FOLLOW.   Report Status 07/18/2011 FINAL   Final   CULTURE, RESPIRATORY     Status: Normal   Collection Time   07/18/11  9:07 PM      Component Value Range Status Comment   Specimen Description SPUTUM EXPECTORATED   Final    Special Requests NONE   Final    Gram Stain     Final    Value: MODERATE WBC PRESENT, PREDOMINANTLY PMN     FEW SQUAMOUS EPITHELIAL CELLS PRESENT     NO ORGANISMS SEEN   Culture     Final    Value: MODERATE HAEMOPHILUS INFLUENZAE     Note: BETA LACTAMASE NEGATIVE   Report Status 07/21/2011 FINAL   Final      Hospital Course: This 46 year old man was admitted with symptoms of dyspnea. He is COPD and oxygen dependent. He also has severe pain in the right hip area and lower back area and is seeing Dr. Gerilyn Pilgrim for management of chronic pain. He has made slow improvement during hospitalization with the use of steroids, antibiotics. His main issue, although, seems to have been pain. He has been requesting additional intravenous opioids on top of his oral opioids. He also has had bilateral leg swelling and there is a possibility that he does have cor pulmonale and right heart failure. He is therefore being treated with intravenous diuretics and now and to be sent home on oral antibiotics. An echocardiogram has been done but the results are pending.  Discharge Exam: Blood pressure 104/65, pulse 99, temperature 97.8 F (36.6 C), temperature source Oral, resp. rate 18, height 6\' 1"  (1.854 m), weight 77 kg (169 lb 12.1 oz), SpO2 98.00%. He looks chronically sick. Lung fields show bilateral wheezing but much improved and I suspect this is his baseline. He is saturating adequately but requires 3 L option. He is alert and orientated. Heart sounds are present and normal without murmurs. He does have  peripheral pitting edema from his knee downwards although this is improved  over the last couple of days.  Disposition: Home. He will need reducing course of steroids and another seven-day course of Levaquin. Echocardiogram report is still pending. He will need followup with his primary care physician, Dr. Sherwood Gambler.  Discharge Orders    Future Orders Please Complete By Expires   Diet - low sodium heart healthy      Increase activity slowly         Follow-up Information    Follow up with Cassell Smiles., MD. Schedule an appointment as soon as possible for a visit in 1 week.   Contact information:   [address] [place] [address] [phone]          Signed: Wilson Singer Pager [phone]  07/24/2011, 10:09 AM

## 2011-07-24 NOTE — Progress Notes (Signed)
Discharge instructions given on medications,and follow up visits,patient verbalized understanding.Prescription sent home with patient.Vital signs stable.Accompanied by staff to an awaiting vehicle.

## 2011-07-24 NOTE — Progress Notes (Signed)
Pts IV pain medicines were discontinued and all night long patient has been complaining of being in pain. Pt is receiving 30 mg Oxy IR every 4 hours as he takes at home but has continued to complain of pain. He did agree to try heat packs and getting up walking some but states that nothing is helping him except for when he was receiving the IV pain medications. Even while receiving the IV pain medications he was still rating his pain at an 8 upon assessment which is still the case. Pt asked me to make a note of how much pain he had been in all night in hopes that he would be able to regain the use of the IV pain medication.

## 2011-07-24 NOTE — Progress Notes (Signed)
*  PRELIMINARY RESULTS* Echocardiogram 2D Echocardiogram has been performed.  Caswell Corwin 07/24/2011, 10:13 AM

## 2011-07-24 NOTE — Progress Notes (Signed)
RA sat 91%.

## 2012-01-02 ENCOUNTER — Inpatient Hospital Stay (HOSPITAL_COMMUNITY)
Admission: EM | Admit: 2012-01-02 | Discharge: 2012-01-06 | DRG: 190 | Disposition: A | Discharge status: Home / Self Care | Payer: Medicaid Other | Attending: Internal Medicine | Admitting: Internal Medicine

## 2012-01-02 ENCOUNTER — Encounter (HOSPITAL_COMMUNITY): Payer: Self-pay | Admitting: *Deleted

## 2012-01-02 ENCOUNTER — Emergency Department (HOSPITAL_COMMUNITY): Payer: Medicaid Other

## 2012-01-02 DIAGNOSIS — J441 Chronic obstructive pulmonary disease with (acute) exacerbation: Secondary | ICD-10-CM | POA: Diagnosis present

## 2012-01-02 DIAGNOSIS — D72829 Elevated white blood cell count, unspecified: Secondary | ICD-10-CM

## 2012-01-02 DIAGNOSIS — I498 Other specified cardiac arrhythmias: Secondary | ICD-10-CM | POA: Diagnosis present

## 2012-01-02 DIAGNOSIS — J209 Acute bronchitis, unspecified: Principal | ICD-10-CM | POA: Diagnosis present

## 2012-01-02 DIAGNOSIS — Z23 Encounter for immunization: Secondary | ICD-10-CM

## 2012-01-02 DIAGNOSIS — G8929 Other chronic pain: Secondary | ICD-10-CM | POA: Diagnosis present

## 2012-01-02 DIAGNOSIS — Z720 Tobacco use: Secondary | ICD-10-CM

## 2012-01-02 DIAGNOSIS — J189 Pneumonia, unspecified organism: Secondary | ICD-10-CM | POA: Diagnosis present

## 2012-01-02 DIAGNOSIS — Z79899 Other long term (current) drug therapy: Secondary | ICD-10-CM

## 2012-01-02 DIAGNOSIS — G894 Chronic pain syndrome: Secondary | ICD-10-CM | POA: Diagnosis present

## 2012-01-02 DIAGNOSIS — J9620 Acute and chronic respiratory failure, unspecified whether with hypoxia or hypercapnia: Secondary | ICD-10-CM

## 2012-01-02 DIAGNOSIS — F1120 Opioid dependence, uncomplicated: Secondary | ICD-10-CM | POA: Diagnosis present

## 2012-01-02 DIAGNOSIS — Z7982 Long term (current) use of aspirin: Secondary | ICD-10-CM

## 2012-01-02 DIAGNOSIS — R509 Fever, unspecified: Secondary | ICD-10-CM

## 2012-01-02 DIAGNOSIS — F1110 Opioid abuse, uncomplicated: Secondary | ICD-10-CM | POA: Diagnosis present

## 2012-01-02 DIAGNOSIS — M791 Myalgia, unspecified site: Secondary | ICD-10-CM

## 2012-01-02 DIAGNOSIS — J440 Chronic obstructive pulmonary disease with acute lower respiratory infection: Principal | ICD-10-CM | POA: Diagnosis present

## 2012-01-02 DIAGNOSIS — Z765 Malingerer [conscious simulation]: Secondary | ICD-10-CM

## 2012-01-02 DIAGNOSIS — IMO0002 Reserved for concepts with insufficient information to code with codable children: Secondary | ICD-10-CM

## 2012-01-02 DIAGNOSIS — Z87891 Personal history of nicotine dependence: Secondary | ICD-10-CM

## 2012-01-02 DIAGNOSIS — Z8701 Personal history of pneumonia (recurrent): Secondary | ICD-10-CM

## 2012-01-02 DIAGNOSIS — J449 Chronic obstructive pulmonary disease, unspecified: Secondary | ICD-10-CM

## 2012-01-02 DIAGNOSIS — Z9981 Dependence on supplemental oxygen: Secondary | ICD-10-CM

## 2012-01-02 DIAGNOSIS — R000 Tachycardia, unspecified: Secondary | ICD-10-CM

## 2012-01-02 DIAGNOSIS — J9610 Chronic respiratory failure, unspecified whether with hypoxia or hypercapnia: Secondary | ICD-10-CM | POA: Diagnosis present

## 2012-01-02 DIAGNOSIS — E860 Dehydration: Secondary | ICD-10-CM

## 2012-01-02 LAB — CBC WITH DIFFERENTIAL/PLATELET
Hemoglobin: 16.4 g/dL (ref 13.0–17.0)
Lymphs Abs: 1.6 10*3/uL (ref 0.7–4.0)
MCH: 30.8 pg (ref 26.0–34.0)
Monocytes Relative: 7 % (ref 3–12)
Neutro Abs: 12.1 10*3/uL — ABNORMAL HIGH (ref 1.7–7.7)
Neutrophils Relative %: 81 % — ABNORMAL HIGH (ref 43–77)
RBC: 5.32 MIL/uL (ref 4.22–5.81)

## 2012-01-02 LAB — BASIC METABOLIC PANEL
BUN: 16 mg/dL (ref 6–23)
CO2: 31 mEq/L (ref 19–32)
Chloride: 92 mEq/L — ABNORMAL LOW (ref 96–112)
Glucose, Bld: 118 mg/dL — ABNORMAL HIGH (ref 70–99)
Potassium: 4 mEq/L (ref 3.5–5.1)

## 2012-01-02 MED ORDER — HYDROMORPHONE HCL PF 1 MG/ML IJ SOLN
1.0000 mg | Freq: Once | INTRAMUSCULAR | Status: AC
  Administered 2012-01-02: 1 mg via INTRAVENOUS

## 2012-01-02 MED ORDER — HYDROMORPHONE HCL PF 1 MG/ML IJ SOLN
1.0000 mg | Freq: Once | INTRAMUSCULAR | Status: AC
  Administered 2012-01-02: 1 mg via INTRAVENOUS
  Filled 2012-01-02: qty 1

## 2012-01-02 MED ORDER — ALBUTEROL SULFATE (5 MG/ML) 0.5% IN NEBU
2.5000 mg | INHALATION_SOLUTION | Freq: Once | RESPIRATORY_TRACT | Status: AC
  Administered 2012-01-02: 2.5 mg via RESPIRATORY_TRACT
  Filled 2012-01-02: qty 0.5

## 2012-01-02 MED ORDER — METHYLPREDNISOLONE SODIUM SUCC 125 MG IJ SOLR
125.0000 mg | Freq: Four times a day (QID) | INTRAMUSCULAR | Status: DC
  Administered 2012-01-02 – 2012-01-03 (×3): 125 mg via INTRAVENOUS
  Filled 2012-01-02 (×3): qty 2

## 2012-01-02 MED ORDER — ALBUTEROL SULFATE (5 MG/ML) 0.5% IN NEBU
2.5000 mg | INHALATION_SOLUTION | RESPIRATORY_TRACT | Status: DC | PRN
  Administered 2012-01-03: 2.5 mg via RESPIRATORY_TRACT
  Filled 2012-01-02: qty 0.5

## 2012-01-02 MED ORDER — FUROSEMIDE 80 MG PO TABS
80.0000 mg | ORAL_TABLET | Freq: Two times a day (BID) | ORAL | Status: DC
  Administered 2012-01-02 – 2012-01-03 (×2): 80 mg via ORAL
  Filled 2012-01-02 (×2): qty 1

## 2012-01-02 MED ORDER — HYDROMORPHONE HCL PF 1 MG/ML IJ SOLN
INTRAMUSCULAR | Status: AC
  Filled 2012-01-02: qty 1

## 2012-01-02 MED ORDER — CEFTRIAXONE SODIUM 1 G IJ SOLR
INTRAMUSCULAR | Status: AC
  Filled 2012-01-02: qty 10

## 2012-01-02 MED ORDER — IPRATROPIUM BROMIDE 0.02 % IN SOLN: 0.5000 mg | RESPIRATORY_TRACT | Status: DC | PRN

## 2012-01-02 MED ORDER — ASPIRIN EC 81 MG PO TBEC
81.0000 mg | DELAYED_RELEASE_TABLET | Freq: Every day | ORAL | Status: DC
  Administered 2012-01-02 – 2012-01-06 (×5): 81 mg via ORAL
  Filled 2012-01-02 (×8): qty 1

## 2012-01-02 MED ORDER — TIOTROPIUM BROMIDE MONOHYDRATE 18 MCG IN CAPS
18.0000 ug | ORAL_CAPSULE | Freq: Every day | RESPIRATORY_TRACT | Status: DC
  Administered 2012-01-03 – 2012-01-06 (×4): 18 ug via RESPIRATORY_TRACT
  Filled 2012-01-02: qty 5

## 2012-01-02 MED ORDER — HYDROMORPHONE HCL PF 1 MG/ML IJ SOLN
1.0000 mg | INTRAMUSCULAR | Status: DC | PRN
  Administered 2012-01-02 – 2012-01-05 (×18): 1 mg via INTRAVENOUS
  Filled 2012-01-02 (×18): qty 1

## 2012-01-02 MED ORDER — METHYLPREDNISOLONE SODIUM SUCC 125 MG IJ SOLR
125.0000 mg | Freq: Once | INTRAMUSCULAR | Status: AC
  Administered 2012-01-02: 125 mg via INTRAVENOUS
  Filled 2012-01-02: qty 2

## 2012-01-02 MED ORDER — ACETAMINOPHEN 500 MG PO TABS
500.0000 mg | ORAL_TABLET | ORAL | Status: DC | PRN
  Administered 2012-01-05 (×2): 500 mg via ORAL
  Filled 2012-01-02 (×2): qty 1

## 2012-01-02 MED ORDER — IPRATROPIUM BROMIDE 0.02 % IN SOLN
0.5000 mg | Freq: Once | RESPIRATORY_TRACT | Status: AC
  Administered 2012-01-02: 0.5 mg via RESPIRATORY_TRACT
  Filled 2012-01-02: qty 2.5

## 2012-01-02 MED ORDER — DEXTROSE 5 % IV SOLN
INTRAVENOUS | Status: AC
  Filled 2012-01-02: qty 500

## 2012-01-02 MED ORDER — NICOTINE 21 MG/24HR TD PT24
21.0000 mg | MEDICATED_PATCH | TRANSDERMAL | Status: DC
  Administered 2012-01-02 – 2012-01-04 (×3): 21 mg via TRANSDERMAL
  Filled 2012-01-02 (×3): qty 1

## 2012-01-02 MED ORDER — OXYCODONE HCL 5 MG PO TABS
30.0000 mg | ORAL_TABLET | ORAL | Status: DC | PRN
  Administered 2012-01-02 – 2012-01-05 (×14): 30 mg via ORAL
  Administered 2012-01-05: 60 mg via ORAL
  Filled 2012-01-02 (×16): qty 6

## 2012-01-02 MED ORDER — ONDANSETRON HCL 4 MG/2ML IJ SOLN
4.0000 mg | Freq: Once | INTRAMUSCULAR | Status: AC
  Administered 2012-01-02: 4 mg via INTRAVENOUS
  Filled 2012-01-02: qty 2

## 2012-01-02 MED ORDER — DEXTROSE 5 % IV SOLN
500.0000 mg | INTRAVENOUS | Status: DC
  Administered 2012-01-02: 500 mg via INTRAVENOUS
  Filled 2012-01-02 (×2): qty 500

## 2012-01-02 MED ORDER — CEFTRIAXONE SODIUM 1 G IJ SOLR
1.0000 g | INTRAMUSCULAR | Status: DC
  Administered 2012-01-02 – 2012-01-04 (×3): 1 g via INTRAVENOUS
  Filled 2012-01-02 (×6): qty 10

## 2012-01-02 MED ORDER — PNEUMOCOCCAL VAC POLYVALENT 25 MCG/0.5ML IJ INJ
0.5000 mL | INJECTION | INTRAMUSCULAR | Status: AC
  Administered 2012-01-03: 0.5 mL via INTRAMUSCULAR
  Filled 2012-01-02: qty 0.5

## 2012-01-02 MED ORDER — INFLUENZA VIRUS VACC SPLIT PF IM SUSP
0.5000 mL | INTRAMUSCULAR | Status: AC
  Administered 2012-01-03: 0.5 mL via INTRAMUSCULAR
  Filled 2012-01-02: qty 0.5

## 2012-01-02 NOTE — ED Notes (Signed)
Chest pain, audible wheeze,white sputum.  On 02 prn at home.

## 2012-01-02 NOTE — ED Provider Notes (Signed)
History    This chart was scribed for Wesley Booze, MD, MD by Smitty Pluck. The patient was seen in room APA09 and the patient's care was started at 1:18PM.   CSN: [id number]  Arrival date & time 01/02/12  1248      Chief Complaint  Patient presents with  . Respiratory Distress    (Consider location/radiation/quality/duration/timing/severity/associated sxs/prior treatment) The history is provided by the patient. No language interpreter was used.   Wesley [name] is a 46 y.o. male with h/o COPD who presents to the Emergency Department complaining of constant, moderate onset 3 days ago. Pt reports having moderate productive cough with clear sputum. He states he has moderate chest pain and describes it as pressure. He states "he feels like someone is standing on his chest."Pt denies fevers, chills, nausea, vomiting and diarrhea. He states that he has used nebulizer at home without relief. He states he has used breathing machine in the past. Pt stopped smoking 6 months ago but reports he is exposed to second hand smoke at home.   PCP is Dr. [name]  Past Medical History  Diagnosis Date  . COPD (chronic obstructive pulmonary disease)     Past Surgical History  Procedure Date  . Joint replacement   . Back surgery     History reviewed. No pertinent family history.  History  Substance Use Topics  . Smoking status: Former Smoker -- 1.0 packs/day    Quit date: 05/04/2011  . Smokeless tobacco: Not on file  . Alcohol Use: No      Review of Systems  All other systems reviewed and are negative.  10 Systems reviewed and all are negative for acute change except as noted in the HPI.    Allergies  Ambien  Home Medications   Current Outpatient Rx  Name Route Sig Dispense Refill  . ALBUTEROL SULFATE HFA 108 (90 BASE) MCG/ACT IN AERS Inhalation Inhale 2 puffs into the lungs every 6 (six) hours as needed. Shortness of breath    . FLUTICASONE-SALMETEROL 500-50 MCG/DOSE IN AEPB  Inhalation Inhale 1 puff into the lungs every 12 (twelve) hours. 60 each 0  . FUROSEMIDE 80 MG PO TABS Oral Take 1 tablet (80 mg total) by mouth 2 (two) times daily. 60 tablet 0  . OXYCODONE HCL 30 MG PO TABS Oral Take 30 mg by mouth 4 (four) times daily.    Marland Kitchen TIOTROPIUM BROMIDE MONOHYDRATE 18 MCG IN CAPS Inhalation Place 1 capsule (18 mcg total) into inhaler and inhale daily. 30 capsule 0    BP 98/59  Pulse 105  Temp 98.9 F (37.2 C) (Oral)  Resp 22  Ht 6\' 1"  (1.854 m)  Wt 166 lb (75.297 kg)  BMI 21.90 kg/m2  SpO2 92%  Physical Exam  Nursing note and vitals reviewed. Constitutional: He is oriented to person, place, and time. He appears well-developed and well-nourished. No distress.  HENT:  Head: Normocephalic and atraumatic.  Eyes: Conjunctivae normal are normal.  Neck: Normal range of motion. Neck supple.  Cardiovascular: Normal rate, regular rhythm and normal heart sounds.   Pulmonary/Chest: Effort normal. No respiratory distress. He has wheezes (diffuse inspiratory and expiratory ).       Decreased air movement   Abdominal: Soft. He exhibits no distension.  Neurological: He is alert and oriented to person, place, and time.  Skin: Skin is warm and dry.  Psychiatric: He has a normal mood and affect. His behavior is normal.    ED Course  Procedures (including  critical care time) DIAGNOSTIC STUDIES: Oxygen Saturation is 92% on [place], low by my interpretation.    COORDINATION OF CARE: 1:22 PM Discussed ED treatment with pt (breathing treatment and steroid) 2:04 PM Ordered:     . albuterol  2.5 mg Nebulization Once  . albuterol  2.5 mg Nebulization Once  . HYDROmorphone  1 mg Intravenous Once  . ipratropium  0.5 mg Nebulization Once  . ipratropium  0.5 mg Nebulization Once  . methylPREDNISolone sodium succinate  125 mg Intravenous Once  . ondansetron  4 mg Intravenous Once     2:38PM Recheck: Discussed lab results and imaging. pt reports that he still has SOB and chest  pain. Lungs have slightly improved airflow but still has sever wheezing. Will order more medication.   3:33 PM Recheck: He claims no improvement, but there is decreased wheezing and improved airflow on exam. He will be given another NMT.  After her third albuterol with Atrovent, there was further improvement on exam, but he continued to have significant wheezing and there is no subjective improvement.  Results for orders placed during the hospital encounter of 01/02/12  CBC WITH DIFFERENTIAL      Component Value Range   WBC 15.0 (*) 4.0 - 10.5 K/uL   RBC 5.32  4.22 - 5.81 MIL/uL   Hemoglobin 16.4  13.0 - 17.0 g/dL   HCT 40.9  81.1 - 91.4 %   MCV 90.2  78.0 - 100.0 fL   MCH 30.8  26.0 - 34.0 pg   MCHC 34.2  30.0 - 36.0 g/dL   RDW 78.2  95.6 - 21.3 %   Platelets 300  150 - 400 K/uL   Neutrophils Relative 81 (*) 43 - 77 %   Neutro Abs 12.1 (*) 1.7 - 7.7 K/uL   Lymphocytes Relative 11 (*) 12 - 46 %   Lymphs Abs 1.6  0.7 - 4.0 K/uL   Monocytes Relative 7  3 - 12 %   Monocytes Absolute 1.1 (*) 0.1 - 1.0 K/uL   Eosinophils Relative 1  0 - 5 %   Eosinophils Absolute 0.2  0.0 - 0.7 K/uL   Basophils Relative 0  0 - 1 %   Basophils Absolute 0.0  0.0 - 0.1 K/uL  BASIC METABOLIC PANEL      Component Value Range   Sodium 134 (*) 135 - 145 mEq/L   Potassium 4.0  3.5 - 5.1 mEq/L   Chloride 92 (*) 96 - 112 mEq/L   CO2 31  19 - 32 mEq/L   Glucose, Bld 118 (*) 70 - 99 mg/dL   BUN 16  6 - 23 mg/dL   Creatinine, Ser 0.86  0.50 - 1.35 mg/dL   Calcium 57.8  8.4 - 46.9 mg/dL   GFR calc non Af Amer >90  >90 mL/min   GFR calc Af Amer >90  >90 mL/min  TROPONIN I      Component Value Range   Troponin I <0.30  <0.30 ng/mL  PRO B NATRIURETIC PEPTIDE      Component Value Range   Pro B Natriuretic peptide (BNP) <5.0  0 - 125 pg/mL   Dg Chest Portable 1 View  01/02/2012  *RADIOLOGY REPORT*  Clinical Data: Shortness of breath  PORTABLE CHEST - 1 VIEW  Comparison: Jul 23, 2011  Findings: There is  underlying emphysema with extensive bullous disease in the upper lobes, stable.  There is currently no edema or consolidation.  There is some interstitial prominence in several  areas, felt to be due to redistribution of blood flow to viable segments of lung.  Heart size is normal.  Pulmonary vascularity is stable and reflects underlying emphysema.  No adenopathy. No bone lesions.  IMPRESSION: Extensive bullous emphysema.  No edema or consolidation.   Original Report Authenticated By: Arvin Collard. WOODRUFF III, M.D.       1. COPD exacerbation     Date: 01/02/2012  Rate: 106  Rhythm: sinus tachycardia  QRS Axis: right  Intervals: normal  ST/T Wave abnormalities: normal  Conduction Disutrbances:none  Narrative Interpretation: Sinus tachycardia, right superior axis deviation, right ventricular hypertrophy, old inferior and anterior wall myocardial infarctions, biatrial hypertrophy. When compared with ECG of Jul 18 2011, no significant changes are seen.  Old EKG Reviewed: unchanged   CRITICAL CARE Performed by: ZOXWR,UEAVW   Total critical care time: 50 minutes  Critical care time was exclusive of separately billable procedures and treating other patients.  Critical care was necessary to treat or prevent imminent or life-threatening deterioration.  Critical care was time spent personally by me on the following activities: development of treatment plan with patient and/or surrogate as well as nursing, discussions with consultants, evaluation of patient's response to treatment, examination of patient, obtaining history from patient or surrogate, ordering and performing treatments and interventions, ordering and review of laboratory studies, ordering and review of radiographic studies, pulse oximetry and re-evaluation of patient's condition.    MDM  Exacerbation of COPD. chest x-ray will be obtained and he will be given prednisolone and nebulizer treatments with albuterol and Atrovent. Prior  records are reviewed, and he does have hospitalizations for COPD exacerbation.  He's failed to respond adequately to 3 nebulizer treatments plus methylprednisolone absolute need to be admitted. Case is discussed with Dr.Knowlton, on call for Dr. [name], who agrees to admit the patient.     I personally performed the services described in this documentation, which was scribed in my presence. The recorded information has been reviewed and considered.    Wesley Booze, MD 01/02/12 1827

## 2012-01-03 DIAGNOSIS — J441 Chronic obstructive pulmonary disease with (acute) exacerbation: Secondary | ICD-10-CM | POA: Diagnosis present

## 2012-01-03 DIAGNOSIS — F1120 Opioid dependence, uncomplicated: Secondary | ICD-10-CM | POA: Diagnosis present

## 2012-01-03 DIAGNOSIS — J189 Pneumonia, unspecified organism: Secondary | ICD-10-CM | POA: Diagnosis present

## 2012-01-03 DIAGNOSIS — J9610 Chronic respiratory failure, unspecified whether with hypoxia or hypercapnia: Secondary | ICD-10-CM | POA: Diagnosis present

## 2012-01-03 LAB — MRSA PCR SCREENING: MRSA by PCR: NEGATIVE

## 2012-01-03 LAB — CBC
HCT: 46.1 % (ref 39.0–52.0)
Hemoglobin: 15.7 g/dL (ref 13.0–17.0)
MCH: 31.1 pg (ref 26.0–34.0)
MCHC: 34.1 g/dL (ref 30.0–36.0)
RDW: 11.8 % (ref 11.5–15.5)

## 2012-01-03 LAB — BASIC METABOLIC PANEL
CO2: 33 mEq/L — ABNORMAL HIGH (ref 19–32)
Calcium: 9.8 mg/dL (ref 8.4–10.5)
Creatinine, Ser: 0.95 mg/dL (ref 0.50–1.35)
Glucose, Bld: 220 mg/dL — ABNORMAL HIGH (ref 70–99)

## 2012-01-03 LAB — GLUCOSE, CAPILLARY

## 2012-01-03 MED ORDER — ALBUTEROL SULFATE (5 MG/ML) 0.5% IN NEBU
2.5000 mg | INHALATION_SOLUTION | Freq: Four times a day (QID) | RESPIRATORY_TRACT | Status: DC
  Administered 2012-01-03 – 2012-01-06 (×13): 2.5 mg via RESPIRATORY_TRACT
  Filled 2012-01-03 (×13): qty 0.5

## 2012-01-03 MED ORDER — BIOTENE DRY MOUTH MT LIQD
15.0000 mL | Freq: Two times a day (BID) | OROMUCOSAL | Status: DC
  Administered 2012-01-03 – 2012-01-05 (×5): 15 mL via OROMUCOSAL

## 2012-01-03 MED ORDER — ALPRAZOLAM 1 MG PO TABS
1.0000 mg | ORAL_TABLET | Freq: Two times a day (BID) | ORAL | Status: DC | PRN
  Administered 2012-01-03 – 2012-01-05 (×4): 1 mg via ORAL
  Filled 2012-01-03 (×4): qty 1

## 2012-01-03 MED ORDER — SODIUM CHLORIDE 0.9 % IJ SOLN
INTRAMUSCULAR | Status: AC
  Administered 2012-01-03: 3 mL
  Filled 2012-01-03: qty 3

## 2012-01-03 MED ORDER — KETOROLAC TROMETHAMINE 30 MG/ML IJ SOLN
30.0000 mg | Freq: Once | INTRAMUSCULAR | Status: AC
  Administered 2012-01-03: 30 mg via INTRAVENOUS
  Filled 2012-01-03: qty 1

## 2012-01-03 MED ORDER — MOMETASONE FURO-FORMOTEROL FUM 100-5 MCG/ACT IN AERO
2.0000 | INHALATION_SPRAY | Freq: Two times a day (BID) | RESPIRATORY_TRACT | Status: DC
  Administered 2012-01-03 – 2012-01-06 (×7): 2 via RESPIRATORY_TRACT
  Filled 2012-01-03: qty 8.8

## 2012-01-03 MED ORDER — PREDNISONE 20 MG PO TABS
60.0000 mg | ORAL_TABLET | Freq: Two times a day (BID) | ORAL | Status: DC
  Administered 2012-01-03 – 2012-01-05 (×4): 60 mg via ORAL
  Filled 2012-01-03 (×4): qty 3

## 2012-01-03 MED ORDER — IBUPROFEN 800 MG PO TABS
400.0000 mg | ORAL_TABLET | Freq: Three times a day (TID) | ORAL | Status: DC
  Administered 2012-01-03 – 2012-01-05 (×6): 400 mg via ORAL
  Filled 2012-01-03 (×6): qty 1

## 2012-01-03 NOTE — Consult Note (Signed)
Consult requested by: Dr. Lendell Caprice Consult requested for COPD exacerbation:  HPI: This is a 46 year old who has severe COPD. He says he's been sick for about 5 days but much worse the last 48 hours. He is unable to lie flat. He says he has some chest discomfort as well and was concerned that he had a pneumothorax because he is had a pneumothorax in the past. He has been coughing up some sputum which is been mostly clear. He has not had any fever. He was able to stop smoking about 6 months ago and is using a nicotine patch. When he presented to the emergency room he identified me as his primary care physician but I have not agreed to take on that role for him but have told him that I will be glad to see him for COPD. In addition to his COPD he has chronic pain with a history of back surgery. He is on chronic narcotics for that and that is managed by Dr. Gerilyn Pilgrim.  Past Medical History  Diagnosis Date  . COPD (chronic obstructive pulmonary disease)      History reviewed. No pertinent family history.   History   Social History  . Marital Status: Married    Spouse Name: N/A    Number of Children: N/A  . Years of Education: N/A   Social History Main Topics  . Smoking status: Former Smoker -- 1.0 packs/day    Quit date: 07/05/2011  . Smokeless tobacco: None  . Alcohol Use: No  . Drug Use: No  . Sexually Active: Not Currently   Other Topics Concern  . None   Social History Narrative  . None     ROS: He has not lost any weight. He has not had any hemoptysis. He has no nausea vomiting or diarrhea. No other symptoms are elicited    Objective: Vital signs in last 24 hours: Temp:  [98 F (36.7 C)-98.9 F (37.2 C)] 98.3 F (36.8 C) (10/30 0504) Pulse Rate:  [86-112] 92  (10/30 0504) Resp:  [13-22] 20  (10/30 0504) BP: (98-110)/(38-66) 104/63 mmHg (10/30 0504) SpO2:  [90 %-97 %] 93 % (10/30 0741) Weight:  [71.4 kg (157 lb 6.5 oz)-75.297 kg (166 lb)] 71.4 kg (157 lb 6.5 oz) (10/29  2136) Weight change:  Last BM Date: 12/30/11  Intake/Output from previous day: 10/29 0701 - 10/30 0700 In: 240 [P.O.:240] Out: -   PHYSICAL EXAM He is a thin male who seems somewhat dyspneic at rest. His respiratory rate is about 20-24. He is using the accessory muscles slightly. His nipples are reactive. His nose and throat are clear. His neck is supple. His chest shows decreased breath sounds and some end expiratory wheezes. His heart is regular with diminished heart sounds. His abdomen is soft without masses. Extremities showed no edema. His central nervous system examination is grossly intact  Lab Results: Basic Metabolic Panel:  Basename 01/02/12 1321  NA 134*  K 4.0  CL 92*  CO2 31  GLUCOSE 118*  BUN 16  CREATININE 0.81  CALCIUM 10.1  MG --  PHOS --   Liver Function Tests: No results found for this basename: AST:2,ALT:2,ALKPHOS:2,BILITOT:2,PROT:2,ALBUMIN:2 in the last 72 hours No results found for this basename: LIPASE:2,AMYLASE:2 in the last 72 hours No results found for this basename: AMMONIA:2 in the last 72 hours CBC:  Basename 01/03/12 0445 01/02/12 1321  WBC 21.0* 15.0*  NEUTROABS -- 12.1*  HGB 15.7 16.4  HCT 46.1 48.0  MCV 91.3 90.2  PLT 302 300   Cardiac Enzymes:  Basename 01/02/12 1321  CKTOTAL --  CKMB --  CKMBINDEX --  TROPONINI <0.30   BNP:  Basename 01/02/12 1321  PROBNP <5.0   D-Dimer: No results found for this basename: DDIMER:2 in the last 72 hours CBG: No results found for this basename: GLUCAP:6 in the last 72 hours Hemoglobin A1C: No results found for this basename: HGBA1C in the last 72 hours Fasting Lipid Panel: No results found for this basename: CHOL,HDL,LDLCALC,TRIG,CHOLHDL,LDLDIRECT in the last 72 hours Thyroid Function Tests: No results found for this basename: TSH,T4TOTAL,FREET4,T3FREE,THYROIDAB in the last 72 hours Anemia Panel: No results found for this basename: VITAMINB12,FOLATE,FERRITIN,TIBC,IRON,RETICCTPCT in the  last 72 hours Coagulation: No results found for this basename: LABPROT:2,INR:2 in the last 72 hours Urine Drug Screen: Drugs of Abuse  No results found for this basename: labopia, cocainscrnur, labbenz, amphetmu, thcu, labbarb    Alcohol Level: No results found for this basename: ETH:2 in the last 72 hours Urinalysis: No results found for this basename: COLORURINE:2,APPERANCEUR:2,LABSPEC:2,PHURINE:2,GLUCOSEU:2,HGBUR:2,BILIRUBINUR:2,KETONESUR:2,PROTEINUR:2,UROBILINOGEN:2,NITRITE:2,LEUKOCYTESUR:2 in the last 72 hours Misc. Labs:   ABGS: No results found for this basename: PHART,PCO2,PO2ART,TCO2,HCO3 in the last 72 hours   MICROBIOLOGY: Recent Results (from the past 240 hour(s))  MRSA PCR SCREENING     Status: Normal   Collection Time   01/02/12  8:37 PM      Component Value Range Status Comment   MRSA by PCR NEGATIVE  NEGATIVE Final     Studies/Results: Dg Chest Portable 1 View  01/02/2012  *RADIOLOGY REPORT*  Clinical Data: Shortness of breath  PORTABLE CHEST - 1 VIEW  Comparison: Jul 23, 2011  Findings: There is underlying emphysema with extensive bullous disease in the upper lobes, stable.  There is currently no edema or consolidation.  There is some interstitial prominence in several areas, felt to be due to redistribution of blood flow to viable segments of lung.  Heart size is normal.  Pulmonary vascularity is stable and reflects underlying emphysema.  No adenopathy. No bone lesions.  IMPRESSION: Extensive bullous emphysema.  No edema or consolidation.   Original Report Authenticated By: Arvin Collard. WOODRUFF III, M.D.     Medications:  Scheduled:   . albuterol  2.5 mg Nebulization Once  . albuterol  2.5 mg Nebulization Once  . albuterol  2.5 mg Nebulization Once  . aspirin EC  81 mg Oral Daily  . azithromycin  500 mg Intravenous Q24H  . cefTRIAXone (ROCEPHIN) IVPB 1 gram/50 mL D5W  1 g Intravenous Q24H  . furosemide  80 mg Oral BID  . HYDROmorphone  1 mg Intravenous  Once  . HYDROmorphone  1 mg Intravenous Once  . HYDROmorphone  1 mg Intravenous Once  . influenza  inactive virus vaccine  0.5 mL Intramuscular Tomorrow-1000  . ipratropium  0.5 mg Nebulization Once  . ipratropium  0.5 mg Nebulization Once  . ipratropium  0.5 mg Nebulization Once  . methylPREDNISolone sodium succinate  125 mg Intravenous Once  . methylPREDNISolone (SOLU-MEDROL) injection  125 mg Intravenous Q6H  . nicotine  21 mg Transdermal Q24H  . ondansetron  4 mg Intravenous Once  . pneumococcal 23 valent vaccine  0.5 mL Intramuscular Tomorrow-1000  . tiotropium  18 mcg Inhalation Daily   Continuous:  LOV:FIEPPIRJJOACZ, albuterol, HYDROmorphone (DILAUDID) injection, ipratropium, oxycodone  Assesment: He is admitted with COPD exacerbation. He also has some chest discomfort but I think that is probably related to his COPD. He has chronic pain. Active Problems:  * No active hospital  problems. *     Plan: He seems to be on appropriate medications. He is on inhaled bronchodilators IV steroids and IV antibiotics. I would treat him as if he has pneumonia considering his elevated white blood cell count although I do not see an infiltrate on chest x-ray I think that is simply because he has such severe COPD    LOS: 1 day   Marco Adelson L 01/03/2012, 8:27 AM

## 2012-01-03 NOTE — Progress Notes (Signed)
Pt given Oxycodone 30mg  PO for PRN pain . Noted that pt chewed pain pills up then swallowed.

## 2012-01-03 NOTE — Progress Notes (Addendum)
Chart reviewed. Discussed with Dr. [name]. Dr. [name] reports that he is not patient's primary care physician. Patient had previously seen Dr. Sherwood Gambler in the past, but there is been some issue with pain medications, according to the patient.  Subjective: Complains of crushing chest pain. Can't even lie down. Preoccupied with pain medications, regimen, previous history of, family his usage of, etc. patient takes Lasix when necessary, usually once a week or so. Quit smoking about 6 months ago. Nurses report that he is chewing his Oxycodone rather than swallowing. Also requesting both oxycodone and dilaudid.  Objective: Vital signs in last 24 hours: Filed Vitals:   01/02/12 2136 01/03/12 0504 01/03/12 0741 01/03/12 1131  BP: 99/66 104/63    Pulse: 98 92    Temp: 98 F (36.7 C) 98.3 F (36.8 C)    TempSrc:  Oral    Resp: 20 20    Height: 6\' 1"  (1.854 m)     Weight: 71.4 kg (157 lb 6.5 oz)     SpO2: 91% 92% 93% 91%   Weight change:   Intake/Output Summary (Last 24 hours) at 01/03/12 1426 Last data filed at 01/03/12 1200  Gross per 24 hour  Intake   1080 ml  Output      0 ml  Net   1080 ml   General: Alert, oriented, appropriate, but preoccupied with pain medication. Able to speak in complete sentences. Lungs: Bilateral wheeze and congestion. Some rhonchi. No rales. Cardiovascular: Regular rate rhythm without murmurs gallops rubs Abdomen soft nontender nondistended Extremities: No edema  Lab Results: Basic Metabolic Panel:  Lab 01/02/12 9604  NA 134*  K 4.0  CL 92*  CO2 31  GLUCOSE 118*  BUN 16  CREATININE 0.81  CALCIUM 10.1  MG --  PHOS --   Liver Function Tests: No results found for this basename: AST:2,ALT:2,ALKPHOS:2,BILITOT:2,PROT:2,ALBUMIN:2 in the last 168 hours No results found for this basename: LIPASE:2,AMYLASE:2 in the last 168 hours No results found for this basename: AMMONIA:2 in the last 168 hours CBC:  Lab 01/03/12 0445 01/02/12 1321  WBC 21.0* 15.0*    NEUTROABS -- 12.1*  HGB 15.7 16.4  HCT 46.1 48.0  MCV 91.3 90.2  PLT 302 300   Cardiac Enzymes:  Lab 01/02/12 1321  CKTOTAL --  CKMB --  CKMBINDEX --  TROPONINI <0.30   BNP:  Lab 01/02/12 1321  PROBNP <5.0   D-Dimer: No results found for this basename: DDIMER:2 in the last 168 hours CBG:  Lab 01/03/12 1029  GLUCAP 192*   Micro Results: Recent Results (from the past 240 hour(s))  MRSA PCR SCREENING     Status: Normal   Collection Time   01/02/12  8:37 PM      Component Value Range Status Comment   MRSA by PCR NEGATIVE  NEGATIVE Final    Studies/Results: Dg Chest Portable 1 View  01/02/2012  *RADIOLOGY REPORT*  Clinical Data: Shortness of breath  PORTABLE CHEST - 1 VIEW  Comparison: Jul 23, 2011  Findings: There is underlying emphysema with extensive bullous disease in the upper lobes, stable.  There is currently no edema or consolidation.  There is some interstitial prominence in several areas, felt to be due to redistribution of blood flow to viable segments of lung.  Heart size is normal.  Pulmonary vascularity is stable and reflects underlying emphysema.  No adenopathy. No bone lesions.  IMPRESSION: Extensive bullous emphysema.  No edema or consolidation.   Original Report Authenticated By: Arvin Collard. WOODRUFF III,  M.D.    Scheduled Meds:   . albuterol  2.5 mg Nebulization Once  . albuterol  2.5 mg Nebulization Once  . albuterol  2.5 mg Nebulization QID  . antiseptic oral rinse  15 mL Mouth Rinse BID  . aspirin EC  81 mg Oral Daily  . cefTRIAXone (ROCEPHIN) IVPB 1 gram/50 mL D5W  1 g Intravenous Q24H  . HYDROmorphone  1 mg Intravenous Once  . HYDROmorphone  1 mg Intravenous Once  . HYDROmorphone  1 mg Intravenous Once  . ibuprofen  400 mg Oral TID WC  . influenza  inactive virus vaccine  0.5 mL Intramuscular Tomorrow-1000  . ipratropium  0.5 mg Nebulization Once  . ipratropium  0.5 mg Nebulization Once  . ketorolac  30 mg Intravenous Once  .  mometasone-formoterol  2 puff Inhalation BID  . nicotine  21 mg Transdermal Q24H  . ondansetron  4 mg Intravenous Once  . pneumococcal 23 valent vaccine  0.5 mL Intramuscular Tomorrow-1000  . predniSONE  60 mg Oral BID WC  . tiotropium  18 mcg Inhalation Daily  . DISCONTD: azithromycin  500 mg Intravenous Q24H  . DISCONTD: furosemide  80 mg Oral BID  . DISCONTD: methylPREDNISolone (SOLU-MEDROL) injection  125 mg Intravenous Q6H   Continuous Infusions:  PRN Meds:.acetaminophen, albuterol, HYDROmorphone (DILAUDID) injection, oxycodone, DISCONTD: ipratropium Assessment/Plan: Principal Problem:  *COPD exacerbation Active Problems:  Chronic pain  Pulmonary infection, acute bronchitis versus clinical pneumonia.  Chronic respiratory failure  Opiate dependence, question drug-seeking and misuse (chewing oxycodone is rather than swallowing). Will not accelerate patient's current regimen  Will change steroids and azithromycin to by mouth.  And nonsteroidal anti-inflammatories.  Add DVT prophylaxis.  Discontinue Lasix and changed to as needed.   LOS: 1 day   Wesley [name] 01/03/2012, 2:26 PM

## 2012-01-03 NOTE — H&P (Signed)
NAMEBRYDAN, Wesley [name] NO.:  [id number]  MEDICAL RECORD NO.:  [id number]  LOCATION:  [facility]                         FACILITY:  [facility]  PHYSICIAN:  Mila Homer. Sudie Bailey, M.D.DATE OF BIRTH:  1966-03-06  DATE OF ADMISSION:  01/02/2012 DATE OF DISCHARGE:  LH                             HISTORY & PHYSICAL   LMD:  Dr. Kari Baars, pulmonologist.  This 46 year old patient Dr. [name] developed more shortness of breath today.  He tells me he does have flare-ups of severe shortness of breath at least once a year, usually in the fall.  He has had an admission to the hospital for this.  He has a history of COPD, acute on chronic respiratory failure, chronic pain, tobacco use.  Surgery is limited to joint replacement and back surgery.  CURRENT MEDICATIONS: 1. Albuterol HFA 2 puffs every 6 hours inhaler. 2. Enteric-coated aspirin 81 mg daily. 3. Advair 500/50 puff b.i.d. 4. Furosemide 80 mg 1 tablet b.i.d. 5. Nicotine 21 mg per 24-hour patch daily. 6. Oxycodone immediate release 30 mg 1-2 tablets every 4 hours for     severe pain, usually used 2 in the morning, then 1 every 4 hours. 7. Spiriva 18 mcg capsule inhaled daily.  GENERAL:  In the emergency room he was supine and resting comfortably at the time I saw him.  He had required a number of breathing treatments to get to this point, however, according to the emergency room physician, with whom I spoke. VITAL SIGNS:  Temperature 98.9, pulse 105, respiratory rate 22, blood pressure 98/59. MENTAL STATUS:  He was oriented and alert and in no acute distress at the time I examined him. SKIN:  Turgor was normal. HEENT:  Mucous membranes moist. LUNGS:  Decreased breath sounds throughout and expiratory wheezing, particularly in the posterior bases. HEART:  Regular rhythm.  Rate of about 90 on my exam. ABDOMEN:  Soft without organomegaly or mass or tenderness. EXTREMITIES:  No edema of the ankles.  His admission white  cell count was 15,000, of which 81% are neutrophils, 11 lymphs.  Hemoglobin 16.4.  Troponin less than 0.30.  Sodium 134, chloride 92.  His glucose was 118.  Chest x-ray showed extensive bullous emphysema.  EKG showed a sinus tachycardia with a rate of 106.  There are EKG signs of an anterior infarct noted in V2 and V3, but there is no significant change in this EKG compared to the EKG of Jul 18, 2011.  ADMISSION DIAGNOSES: 1. Probable chronic obstructive pulmonary disease exacerbation. 2. Tobacco use disorder. 3. Chronic pain syndrome. 4. History of acute on chronic respiratory failure. 5. History of pneumonia.  He will be admitted to the hospital on Solu-Medrol IV, IV fluids, and albuterol with ipratropium neb treatments every 2 hours as needed.  I am adding antibiotics given the height of his white cell count.  His CBC and BMP will be repeated in the morning.  Respiratory Therapy will be monitoring his respiratory status.  He will be seen by his LMD, Dr. Shaune Pollack, tomorrow morning at rounds.     Mila Homer. Sudie Bailey, M.D.     SDK/MEDQ  D:  01/02/2012  T:  01/03/2012  Job:  [id number]  cc:   Ramon Dredge L. [name], M.D. Fax: [phone]

## 2012-01-03 NOTE — Progress Notes (Signed)
01/03/12 1940 Patient receiving oxycodone 30 mg po PRN for pain, noted to chew pain pills before swallowing this evening as well. Dr Lendell Caprice notified of this when occurred earlier today.

## 2012-01-03 NOTE — Progress Notes (Signed)
UR Chart Review Completed  

## 2012-01-03 NOTE — Progress Notes (Signed)
Patient given 30 mg of oxycodone and chewed them before swallowing .

## 2012-01-03 NOTE — Progress Notes (Signed)
Patient given 30mg  oxycodone for c/o back pain.  Patient chews medication before swallowing.

## 2012-01-04 DIAGNOSIS — G8929 Other chronic pain: Secondary | ICD-10-CM

## 2012-01-04 LAB — GLUCOSE, CAPILLARY

## 2012-01-04 MED ORDER — ENOXAPARIN SODIUM 40 MG/0.4ML ~~LOC~~ SOLN
40.0000 mg | SUBCUTANEOUS | Status: DC
  Administered 2012-01-04 – 2012-01-05 (×2): 40 mg via SUBCUTANEOUS
  Filled 2012-01-04 (×3): qty 0.4

## 2012-01-04 MED ORDER — POTASSIUM CHLORIDE CRYS ER 20 MEQ PO TBCR
20.0000 meq | EXTENDED_RELEASE_TABLET | Freq: Three times a day (TID) | ORAL | Status: DC
  Administered 2012-01-04 – 2012-01-05 (×3): 20 meq via ORAL
  Filled 2012-01-04 (×3): qty 1

## 2012-01-04 MED ORDER — MAGNESIUM HYDROXIDE 400 MG/5ML PO SUSP: 30.0000 mL | Freq: Every day | ORAL | Status: DC | PRN

## 2012-01-04 MED ORDER — ROFLUMILAST 500 MCG PO TABS
500.0000 ug | ORAL_TABLET | Freq: Every day | ORAL | Status: DC
  Administered 2012-01-04 – 2012-01-06 (×3): 500 ug via ORAL
  Filled 2012-01-04 (×5): qty 1

## 2012-01-04 MED ORDER — DOCUSATE SODIUM 100 MG PO CAPS
100.0000 mg | ORAL_CAPSULE | Freq: Two times a day (BID) | ORAL | Status: DC
  Administered 2012-01-04 – 2012-01-06 (×4): 100 mg via ORAL
  Filled 2012-01-04 (×4): qty 1

## 2012-01-04 NOTE — Progress Notes (Signed)
Inpatient Diabetes Program Recommendations  AACE/ADA: New Consensus Statement on Inpatient Glycemic Control (2013)  Target Ranges:  Prepandial:   less than 140 mg/dL      Peak postprandial:   less than 180 mg/dL (1-2 hours)      Critically ill patients:  140 - 180 mg/dL   Results for THOAMS, SIEFERT (MRN [id number]) as of 01/04/2012 10:03  Ref. Range 01/03/2012 10:29 01/03/2012 15:23 01/04/2012 07:56  Glucose-Capillary Latest Range: 70-99 mg/dL 409 (H) 811 (H) 914 (H)    Inpatient Diabetes Program Recommendations Correction (SSI): Please consider obtaining CBGs ACHS and starting Novolog sensitive correction scale while patient is on Steroids.  Note: Patient has no history of DM but CBGs are elevated ranging from 192-220 mg/dl over the past 12 hours. Please consider check CBGs ACHS and starting Novolog sensitive correction scale while patient is on steroids.  Will continue to follow. Thanks, Orlando Penner, RN, BSN, CCRN Diabetes Coordinator Inpatient Diabetes Program [phone]

## 2012-01-04 NOTE — Progress Notes (Signed)
Subjective: He says he is somewhat better but still with significant shortness of breath and he still can't lie flat.  Objective: Vital signs in last 24 hours: Temp:  [97.2 F (36.2 C)-97.7 F (36.5 C)] 97.2 F (36.2 C) (10/31 0538) Pulse Rate:  [75-108] 76  (10/31 0538) Resp:  [20] 20  (10/31 0538) BP: (95-118)/(53-68) 100/63 mmHg (10/31 0538) SpO2:  [90 %-96 %] 92 % (10/31 4098) Weight change:  Last BM Date: 12/30/11  Intake/Output from previous day: 10/30 0701 - 10/31 0700 In: 1183 [P.O.:1080; I.V.:3; IV Piggyback:100] Out: -   PHYSICAL EXAM General appearance: alert, cooperative and mild distress Resp: rhonchi bilaterally Cardio: regular rate and rhythm, S1, S2 normal, no murmur, click, rub or gallop GI: soft, non-tender; bowel sounds normal; no masses,  no organomegaly Extremities: extremities normal, atraumatic, no cyanosis or edema  Lab Results:    Basic Metabolic Panel:  Basename 01/03/12 1817 01/02/12 1321  NA 136 134*  K 3.3* 4.0  CL 92* 92*  CO2 33* 31  GLUCOSE 220* 118*  BUN 21 16  CREATININE 0.95 0.81  CALCIUM 9.8 10.1  MG -- --  PHOS -- --   Liver Function Tests: No results found for this basename: AST:2,ALT:2,ALKPHOS:2,BILITOT:2,PROT:2,ALBUMIN:2 in the last 72 hours No results found for this basename: LIPASE:2,AMYLASE:2 in the last 72 hours No results found for this basename: AMMONIA:2 in the last 72 hours CBC:  Basename 01/03/12 0445 01/02/12 1321  WBC 21.0* 15.0*  NEUTROABS -- 12.1*  HGB 15.7 16.4  HCT 46.1 48.0  MCV 91.3 90.2  PLT 302 300   Cardiac Enzymes:  Basename 01/02/12 1321  CKTOTAL --  CKMB --  CKMBINDEX --  TROPONINI <0.30   BNP:  Basename 01/02/12 1321  PROBNP <5.0   D-Dimer: No results found for this basename: DDIMER:2 in the last 72 hours CBG:  Basename 01/04/12 0756 01/03/12 1523 01/03/12 1029  GLUCAP 206* 205* 192*   Hemoglobin A1C: No results found for this basename: HGBA1C in the last 72 hours Fasting  Lipid Panel: No results found for this basename: CHOL,HDL,LDLCALC,TRIG,CHOLHDL,LDLDIRECT in the last 72 hours Thyroid Function Tests: No results found for this basename: TSH,T4TOTAL,FREET4,T3FREE,THYROIDAB in the last 72 hours Anemia Panel: No results found for this basename: VITAMINB12,FOLATE,FERRITIN,TIBC,IRON,RETICCTPCT in the last 72 hours Coagulation: No results found for this basename: LABPROT:2,INR:2 in the last 72 hours Urine Drug Screen: Drugs of Abuse  No results found for this basename: labopia, cocainscrnur, labbenz, amphetmu, thcu, labbarb    Alcohol Level: No results found for this basename: ETH:2 in the last 72 hours Urinalysis: No results found for this basename: COLORURINE:2,APPERANCEUR:2,LABSPEC:2,PHURINE:2,GLUCOSEU:2,HGBUR:2,BILIRUBINUR:2,KETONESUR:2,PROTEINUR:2,UROBILINOGEN:2,NITRITE:2,LEUKOCYTESUR:2 in the last 72 hours Misc. Labs:  ABGS No results found for this basename: PHART,PCO2,PO2ART,TCO2,HCO3 in the last 72 hours CULTURES Recent Results (from the past 240 hour(s))  MRSA PCR SCREENING     Status: Normal   Collection Time   01/02/12  8:37 PM      Component Value Range Status Comment   MRSA by PCR NEGATIVE  NEGATIVE Final    Studies/Results: Dg Chest Portable 1 View  01/02/2012  *RADIOLOGY REPORT*  Clinical Data: Shortness of breath  PORTABLE CHEST - 1 VIEW  Comparison: Jul 23, 2011  Findings: There is underlying emphysema with extensive bullous disease in the upper lobes, stable.  There is currently no edema or consolidation.  There is some interstitial prominence in several areas, felt to be due to redistribution of blood flow to viable segments of lung.  Heart size is normal.  Pulmonary vascularity is stable and reflects underlying emphysema.  No adenopathy. No bone lesions.  IMPRESSION: Extensive bullous emphysema.  No edema or consolidation.   Original Report Authenticated By: Arvin Collard. WOODRUFF III, M.D.     Medications:  Prior to Admission:    Prescriptions prior to admission  Medication Sig Dispense Refill  . albuterol (PROVENTIL HFA;VENTOLIN HFA) 108 (90 BASE) MCG/ACT inhaler Inhale 2 puffs into the lungs every 6 (six) hours as needed. Shortness of breath      . aspirin EC 81 MG tablet Take 81 mg by mouth daily.      . Fluticasone-Salmeterol (ADVAIR) 500-50 MCG/DOSE AEPB Inhale 1 puff into the lungs every 12 (twelve) hours.  60 each  0  . furosemide (LASIX) 80 MG tablet Take 1 tablet (80 mg total) by mouth 2 (two) times daily.  60 tablet  0  . nicotine (NICODERM CQ - DOSED IN MG/24 HOURS) 21 mg/24hr patch Place 1 patch onto the skin daily.      Marland Kitchen oxycodone (ROXICODONE) 30 MG immediate release tablet Take 30-60 mg by mouth every 4 (four) hours as needed. *Takes 2 in the morning, then takes one every 4 hours thereafter For pain      . tiotropium (SPIRIVA) 18 MCG inhalation capsule Place 1 capsule (18 mcg total) into inhaler and inhale daily.  30 capsule  0   Scheduled:   . albuterol  2.5 mg Nebulization QID  . antiseptic oral rinse  15 mL Mouth Rinse BID  . aspirin EC  81 mg Oral Daily  . cefTRIAXone (ROCEPHIN) IVPB 1 gram/50 mL D5W  1 g Intravenous Q24H  . ibuprofen  400 mg Oral TID WC  . influenza  inactive virus vaccine  0.5 mL Intramuscular Tomorrow-1000  . ketorolac  30 mg Intravenous Once  . mometasone-formoterol  2 puff Inhalation BID  . nicotine  21 mg Transdermal Q24H  . pneumococcal 23 valent vaccine  0.5 mL Intramuscular Tomorrow-1000  . predniSONE  60 mg Oral BID WC  . roflumilast  500 mcg Oral Daily  . sodium chloride      . tiotropium  18 mcg Inhalation Daily  . DISCONTD: azithromycin  500 mg Intravenous Q24H  . DISCONTD: furosemide  80 mg Oral BID  . DISCONTD: methylPREDNISolone (SOLU-MEDROL) injection  125 mg Intravenous Q6H   Continuous:  ZOX:[id number], albuterol, ALPRAZolam, HYDROmorphone (DILAUDID) injection, oxycodone, DISCONTD: ipratropium  Assesment:  He has COPD with severe exacerbation. He has  chronic pain. He has significant bullous disease. He did not have a blood gas but based on the bicarbonate level in his lab work I think he probably has chronic elevated PCO2 Principal Problem:  *COPD exacerbation Active Problems:  Chronic pain  Opiate dependence  Chronic respiratory failure  Pulmonary infection    Plan: I added Daliresp. Once he is better he should probably have an evaluation by the thoracic surgeons to see if he is a candidate for lung reduction surgery    LOS: 2 days   Yarelly Kuba L 01/04/2012, 8:31 AM

## 2012-01-04 NOTE — Progress Notes (Signed)
Per RN, continues to ask for oral and IV pain medications.  Reportedly fell.  This was not witnessed.  Was able to wash up and shave   Subjective: Patient continues to be preoccupied with pain medication, asking for an additional IV doses of Dilaudid, etc. Again, discussions of pain medications completely dominate the encounter today. Reports his breathing is about the same.  Objective: Vital signs in last 24 hours: Filed Vitals:   01/04/12 1121 01/04/12 1503 01/04/12 1505 01/04/12 1648  BP:   104/65   Pulse:   90   Temp:   97.3 F (36.3 C)   TempSrc:   Oral   Resp:   18   Height:      Weight:    110.8 kg (244 lb 4.3 oz)  SpO2: 95% 94% 94%    Weight change:   Intake/Output Summary (Last 24 hours) at 01/04/12 1709 Last data filed at 01/04/12 1200  Gross per 24 hour  Intake    940 ml  Output      0 ml  Net    940 ml   General: Alert, oriented, appropriate, but preoccupied with pain medication. Able to speak in complete sentences. Eating supper without difficulty. Lungs: Bilateral wheeze. No rhonchi. No rales. Prolonged expiratory phase. No accessory muscle usage. Cardiovascular: Regular rate rhythm without murmurs gallops rubs Abdomen soft nontender nondistended Extremities: No edema. No abrasions or deformities.  Lab Results: Basic Metabolic Panel:  Lab 01/03/12 0454 01/02/12 1321  NA 136 134*  K 3.3* 4.0  CL 92* 92*  CO2 33* 31  GLUCOSE 220* 118*  BUN 21 16  CREATININE 0.95 0.81  CALCIUM 9.8 10.1  MG -- --  PHOS -- --   Liver Function Tests: No results found for this basename: AST:2,ALT:2,ALKPHOS:2,BILITOT:2,PROT:2,ALBUMIN:2 in the last 168 hours No results found for this basename: LIPASE:2,AMYLASE:2 in the last 168 hours No results found for this basename: AMMONIA:2 in the last 168 hours CBC:  Lab 01/03/12 0445 01/02/12 1321  WBC 21.0* 15.0*  NEUTROABS -- 12.1*  HGB 15.7 16.4  HCT 46.1 48.0  MCV 91.3 90.2  PLT 302 300   Cardiac Enzymes:  Lab 01/02/12  1321  CKTOTAL --  CKMB --  CKMBINDEX --  TROPONINI <0.30   BNP:  Lab 01/02/12 1321  PROBNP <5.0   D-Dimer: No results found for this basename: DDIMER:2 in the last 168 hours CBG:  Lab 01/04/12 0756 01/03/12 1523 01/03/12 1029  GLUCAP 206* 205* 192*   Micro Results: Recent Results (from the past 240 hour(s))  MRSA PCR SCREENING     Status: Normal   Collection Time   01/02/12  8:37 PM      Component Value Range Status Comment   MRSA by PCR NEGATIVE  NEGATIVE Final    Studies/Results: No results found. Scheduled Meds:    . albuterol  2.5 mg Nebulization QID  . antiseptic oral rinse  15 mL Mouth Rinse BID  . aspirin EC  81 mg Oral Daily  . cefTRIAXone (ROCEPHIN) IVPB 1 gram/50 mL D5W  1 g Intravenous Q24H  . enoxaparin  40 mg Subcutaneous Q24H  . ibuprofen  400 mg Oral TID WC  . mometasone-formoterol  2 puff Inhalation BID  . nicotine  21 mg Transdermal Q24H  . predniSONE  60 mg Oral BID WC  . roflumilast  500 mcg Oral Daily  . tiotropium  18 mcg Inhalation Daily   Continuous Infusions:  PRN Meds:.acetaminophen, albuterol, ALPRAZolam, HYDROmorphone (DILAUDID) injection, oxycodone  Assessment/Plan: Principal Problem:  *COPD exacerbation: Less congestion or rhonchi today. Active Problems:  Chronic pain  Pulmonary infection, acute bronchitis versus clinical pneumonia.  Chronic respiratory failure  Opiate dependence with continued drug-seeking behavior. He was confronted about chewing his oxycodones. He reportedly fell and I will not accelerate his opiate regimen. He voices understanding.  We will continue nonsteroidal anti-inflammatories. Will get physical therapy consult.   LOS: 2 days   Wesley [name] L 01/04/2012, 5:09 PM.

## 2012-01-04 NOTE — Care Management Note (Unsigned)
    Page 1 of 1   01/04/2012     12:49:05 PM   CARE MANAGEMENT NOTE 01/04/2012  Patient:  Wesley [name], Wesley [name]   Account Number:  [id number]  Date Initiated:  01/04/2012  Documentation initiated by:  Rosemary Holms  Subjective/Objective Assessment:   Pt admitted from home with spouse. COPD exascerbation and pain. Plan to DC home with spouse. No HH needs identified or anticipated.     Action/Plan:   Anticipated DC Date:  01/05/2012   Anticipated DC Plan:  HOME/SELF CARE      DC Planning Services  CM consult      Choice offered to / List presented to:             Status of service:  In process, will continue to follow Medicare Important Message given?   (If response is "NO", the following Medicare IM given date fields will be blank) Date Medicare IM given:   Date Additional Medicare IM given:    Discharge Disposition:    Per UR Regulation:    If discussed at Long Length of Stay Meetings, dates discussed:    Comments:  01/04/12 Rosemary Holms RN BSN CM

## 2012-01-04 NOTE — Progress Notes (Signed)
01/04/12 1251 Patient ambulates in room, remains sitting on side of bed, states only position that is comfortable with his back pain, states unable to lay down d/t worsened pain and difficulty breathing. Earnstine Regal, RN

## 2012-01-04 NOTE — Progress Notes (Signed)
01/04/12 1846 Late entry for 1510. Arrived in patient room this afternoon about 1505, pt sitting at side of bed. Stated fell in bathroom while attempting to finish bathing and change clothes, unwitnessed by nursing staff. Stated did not call for any assist or to notify nursing staff after fall. States had nonslip socks on at time. No apparent bruises or injuries noted, small sore to right ear, small amount bleeding. bandaid applied. Continues to c/o back pain. Vital signs within normal limits, neuro status within normal limits. States no dizziness prior to or after fall. Notified Dr Lendell Caprice. No new orders given. Reinforced fall prevention/safey plan with patient, bedalarm on for safety, red socks in place, yellow armband in place, instructed to call for assistance and wait for nursing staff assist/supervision, not get up on his own. Verbalized understanding. Call light and personal items within reach. Nursing continues to monitor. Earnstine Regal, RN

## 2012-01-04 NOTE — Progress Notes (Signed)
01/04/12 1850 Patient continues to c/o back pain and requests po and IV pain medications be given at same time. Discussed po and IV pain medications not to be administered together d/t safety. Patient verbalized understanding. Pt ambulated in hallway this evening,with nurse supervision. tolerated fairly well, some back pain with activity. Assisted back to room, bedalarm on for safety, instructed to call for assist and not get up on his own. Earnstine Regal, RN

## 2012-01-05 DIAGNOSIS — F1110 Opioid abuse, uncomplicated: Secondary | ICD-10-CM | POA: Diagnosis present

## 2012-01-05 LAB — GLUCOSE, CAPILLARY: Glucose-Capillary: 180 mg/dL — ABNORMAL HIGH (ref 70–99)

## 2012-01-05 MED ORDER — CEFUROXIME AXETIL 250 MG PO TABS
500.0000 mg | ORAL_TABLET | Freq: Two times a day (BID) | ORAL | Status: DC
  Administered 2012-01-05 – 2012-01-06 (×2): 500 mg via ORAL
  Filled 2012-01-05 (×2): qty 2

## 2012-01-05 MED ORDER — NICOTINE 21 MG/24HR TD PT24
21.0000 mg | MEDICATED_PATCH | TRANSDERMAL | Status: DC
  Administered 2012-01-05: 21 mg via TRANSDERMAL
  Filled 2012-01-05: qty 1

## 2012-01-05 MED ORDER — IBUPROFEN 800 MG PO TABS
400.0000 mg | ORAL_TABLET | Freq: Four times a day (QID) | ORAL | Status: DC | PRN
  Administered 2012-01-05: 400 mg via ORAL
  Filled 2012-01-05: qty 1

## 2012-01-05 MED ORDER — PREDNISONE 20 MG PO TABS
60.0000 mg | ORAL_TABLET | Freq: Every day | ORAL | Status: DC
  Administered 2012-01-06: 60 mg via ORAL
  Filled 2012-01-05: qty 3

## 2012-01-05 MED ORDER — ALPRAZOLAM 0.5 MG PO TABS
0.5000 mg | ORAL_TABLET | Freq: Two times a day (BID) | ORAL | Status: DC | PRN
  Administered 2012-01-05 – 2012-01-06 (×3): 0.5 mg via ORAL
  Filled 2012-01-05 (×4): qty 1

## 2012-01-05 MED ORDER — OXYCODONE HCL 5 MG PO TABS
15.0000 mg | ORAL_TABLET | Freq: Three times a day (TID) | ORAL | Status: DC
  Administered 2012-01-05 – 2012-01-06 (×3): 15 mg via ORAL
  Filled 2012-01-05 (×3): qty 3

## 2012-01-05 NOTE — Progress Notes (Addendum)
Discussed with Dr. [name].  Subjective: Patient ambulated with physical therapy. Does not want to go home, because he is worried about his pain medication. He reports that he missed his appointment with Dr. Gerilyn Pilgrim yesterday. Asking for a prescription for opiates when he leaves. Nurses report continued demands for pain medication, and drug-seeking behaviors. Does admit that his breathing is slightly better today.  Objective: Vital signs in last 24 hours: Filed Vitals:   01/05/12 0459 01/05/12 0720 01/05/12 1136 01/05/12 1146  BP: 94/58     Pulse: 83     Temp: 97.6 F (36.4 C)     TempSrc: Oral     Resp: 16     Height:      Weight:      SpO2: 95% 94% 90% 95%   Weight change:   Intake/Output Summary (Last 24 hours) at 01/05/12 1329 Last data filed at 01/05/12 0551  Gross per 24 hour  Intake    290 ml  Output      0 ml  Net    290 ml   General: Alert, oriented, appropriate, but preoccupied with pain medication. Able to speak in complete sentences. Eating supper without difficulty. Lungs: Bilateral wheeze. No rhonchi. No rales. expiratory phase seems a bit less prolonged today.. No accessory muscle usage. Cardiovascular: Regular rate rhythm without murmurs gallops rubs Abdomen soft nontender nondistended Extremities: No edema. No abrasions or deformities.  Lab Results: Basic Metabolic Panel:  Lab 01/03/12 1610 01/02/12 1321  NA 136 134*  K 3.3* 4.0  CL 92* 92*  CO2 33* 31  GLUCOSE 220* 118*  BUN 21 16  CREATININE 0.95 0.81  CALCIUM 9.8 10.1  MG -- --  PHOS -- --   Liver Function Tests: No results found for this basename: AST:2,ALT:2,ALKPHOS:2,BILITOT:2,PROT:2,ALBUMIN:2 in the last 168 hours No results found for this basename: LIPASE:2,AMYLASE:2 in the last 168 hours No results found for this basename: AMMONIA:2 in the last 168 hours CBC:  Lab 01/03/12 0445 01/02/12 1321  WBC 21.0* 15.0*  NEUTROABS -- 12.1*  HGB 15.7 16.4  HCT 46.1 48.0  MCV 91.3 90.2  PLT  302 300   Cardiac Enzymes:  Lab 01/02/12 1321  CKTOTAL --  CKMB --  CKMBINDEX --  TROPONINI <0.30   BNP:  Lab 01/02/12 1321  PROBNP <5.0   D-Dimer: No results found for this basename: DDIMER:2 in the last 168 hours CBG:  Lab 01/05/12 0715 01/04/12 0756 01/03/12 1523 01/03/12 1029  GLUCAP 180* 206* 205* 192*   Micro Results: Recent Results (from the past 240 hour(s))  MRSA PCR SCREENING     Status: Normal   Collection Time   01/02/12  8:37 PM      Component Value Range Status Comment   MRSA by PCR NEGATIVE  NEGATIVE Final    Studies/Results: No results found. Scheduled Meds:    . albuterol  2.5 mg Nebulization QID  . antiseptic oral rinse  15 mL Mouth Rinse BID  . aspirin EC  81 mg Oral Daily  . cefUROXime  500 mg Oral BID WC  . docusate sodium  100 mg Oral BID  . enoxaparin  40 mg Subcutaneous Q24H  . mometasone-formoterol  2 puff Inhalation BID  . oxycodone  15 mg Oral Q8H  . predniSONE  60 mg Oral Q breakfast  . roflumilast  500 mcg Oral Daily  . tiotropium  18 mcg Inhalation Daily  . DISCONTD: cefTRIAXone (ROCEPHIN) IVPB 1 gram/50 mL D5W  1 g Intravenous Q24H  .  DISCONTD: ibuprofen  400 mg Oral TID WC  . DISCONTD: nicotine  21 mg Transdermal Q24H  . DISCONTD: potassium chloride  20 mEq Oral TID  . DISCONTD: predniSONE  60 mg Oral BID WC   Continuous Infusions:  PRN Meds:.acetaminophen, albuterol, ALPRAZolam, ibuprofen, magnesium hydroxide, DISCONTD: ALPRAZolam, DISCONTD:  HYDROmorphone (DILAUDID) injection, DISCONTD: oxycodone Assessment/Plan: Principal Problem:  *COPD exacerbation: Less congestion or rhonchi today. Active Problems:  Chronic pain  Pulmonary infection, acute bronchitis versus clinical pneumonia.  Chronic respiratory failure  Opiate dependence with continued drug-seeking behavior. He was confronted about chewing his oxycodones. With patient's permission, I discussed his pain management with Dr. Ronal Fear nurse practitioner, Gillis Santa. She confirms that patient canceled his appointment yesterday, because he was in the hospital. The patient was seen on October 17 and given a 15 day supply of oxycodone. She reports that he has been given prescription for oxycodone 15 mg every 8 hours as needed, 90 tablets per month. She also reports that he had inconsistencies in his drug test on the 17th, thus the appointment again on the 31st. He had morphine and hydromorphone present in high amounts. Patient told hospital staff on admission that he takes oxycodone 30 mg tablets 2 in the morning and then one every 4 hours as needed. When confronted about the discrepancy, he said "they must have changed it." Then "well maybe I got mixed up." Patient is abusing opiates, as evidenced by these inconsistencies, drug-seeking behavior, chewing oxycodone. I will change his oxycodone to 15 mg scheduled every 8 hours and stop IV Dilaudid. He voices understanding. I will give him a prescription for enough oxycodone to last until Monday, as per the recommendation of Ms. Wade-Foster. Anticipate discharge tomorrow. His respiratory status is improved. He has severe COPD and may be at baseline. Will taper steroids. Change antibiotics to mouth. Discontinue IV. Dr. [name] reports that patient is clinically stable from his perspective to discharge today or sometime this weekend.   LOS: 3 days   Amee Boothe L 01/05/2012, 1:29 PM.

## 2012-01-05 NOTE — Evaluation (Signed)
Physical Therapy Evaluation Patient Details Name: Wesley [name] MRN: [id number] DOB: February 12, 1966 Today's Date: 01/05/2012 Time: 6962-9528 PT Time Calculation (min): 26 min  PT Assessment / Plan / Recommendation Clinical Impression  Pt was seen for an eval.  Eval was done informally as pt was anxious to walk.  He states that his pain has recently been exacerbated by hi respiratory distress...he is unable to lie down in the bed to rest (he sits at EOB, leaning forward with his elbows on knees)...this has exacerbated his back pain...currently, he is able to walk for a functional distance but would benefit from the use of a walker...he used an IV pole (at his insistance) and maintained his trunk in full flexion which is very stressful to the back musculature.Marland KitchenMarland KitchenI finally got him to agree to use a walker in the future if his back is still so painful.Marland KitchenMarland KitchenI will ask nursing service to ambulate him over the weekend for increased activity    PT Assessment  Patient needs continued PT services    Follow Up Recommendations  No PT follow up;Supervision/Assistance - 24 hour    Does the patient have the potential to tolerate intense rehabilitation      Barriers to Discharge None      Equipment Recommendations  Rolling walker with 5" wheels    Recommendations for Other Services     Frequency Min 3X/week    Precautions / Restrictions Precautions Precautions: Fall Restrictions Weight Bearing Restrictions: No   Pertinent Vitals/Pain       Mobility  Bed Mobility Details for Bed Mobility Assistance: pt has been moving independently in the bed.Marland KitchenMarland KitchenI did not observe this today Transfers Transfers: Sit to Stand;Stand to Sit Sit to Stand: 7: Independent;Without upper extremity assist;From bed Stand to Sit: 7: Independent;Without upper extremity assist;To bed Ambulation/Gait Ambulation/Gait Assistance: 5: Supervision Ambulation Distance (Feet): 175 Feet Assistive device:  (IV pole) Ambulation/Gait  Assistance Details: Pt wants to hold onto IV pole...is flexed forward in order to lean on the pole which is a stress on his low back...he states that this is the first time he has been walking and he has to stand like this.Marland KitchenMarland KitchenA walker would be more appropriate and he is finally agreeable to use one.Marland KitchenMarland KitchenI had him stand against a wall and he is able to stand erect, but he states that his pain iis now a 1"10" instaead of an "8" Gait Pattern: Trunk flexed General Gait Details: tends to internally rotate R LE during swing thru and occasionally scissors his LLE...he states that he is in need of a R THR Stairs: No Wheelchair Mobility Wheelchair Mobility: No    Shoulder Instructions     Exercises     PT Diagnosis: Difficulty walking;Acute pain  PT Problem List: Pain;Cardiopulmonary status limiting activity PT Treatment Interventions: Gait training;Stair training   PT Goals Acute Rehab PT Goals PT Goal Formulation: With patient Time For Goal Achievement: 01/12/12 Potential to Achieve Goals: Good Pt will Ambulate: >150 feet;with modified independence;with rolling walker PT Goal: Ambulate - Progress: Goal set today Pt will Go Up / Down Stairs: 3-5 stairs;with supervision;with rail(s) PT Goal: Up/Down Stairs - Progress: Goal set today  Visit Information  Last PT Received On: 01/05/12    Subjective Data  Subjective: I've been waiting for you to walk with me Patient Stated Goal: wants to be able to breathe better...this will help his back pain   Prior Functioning  Home Living Lives With: Spouse Available Help at Discharge: Family;Available 24 hours/day Type of Home:  House Home Access: Stairs to enter Secretary/administrator of Steps: 3 Entrance Stairs-Rails: Right Home Layout: One level Firefighter: Standard Home Adaptive Equipment: Straight cane Prior Function Level of Independence: Independent Able to Take Stairs?: Yes Driving: Yes Vocation: On  disability Communication Communication: No difficulties    Cognition  Overall Cognitive Status: Appears within functional limits for tasks assessed/performed Arousal/Alertness: Awake/alert Orientation Level: Appears intact for tasks assessed Behavior During Session: [facility] for tasks performed    Extremity/Trunk Assessment Right Upper Extremity Assessment RUE ROM/Strength/Tone: WFL for tasks assessed RUE Sensation: WFL - Light Touch RUE Coordination: WFL - gross motor Left Upper Extremity Assessment LUE ROM/Strength/Tone: WFL for tasks assessed LUE Sensation: WFL - Light Touch LUE Coordination: WFL - gross motor Right Lower Extremity Assessment RLE ROM/Strength/Tone: WFL for tasks assessed RLE Coordination: WFL - gross motor Left Lower Extremity Assessment LLE ROM/Strength/Tone: WFL for tasks assessed LLE Coordination: WFL - gross motor Trunk Assessment Trunk Assessment: Kyphotic   Balance Balance Balance Assessed:  (WNL by observation)  End of Session PT - End of Session Equipment Utilized During Treatment: Gait belt Activity Tolerance: Patient limited by pain Patient left: in bed;with call bell/phone within reach Nurse Communication: Mobility status  GP     Myrlene Broker L 01/05/2012, 11:51 AM

## 2012-01-05 NOTE — Progress Notes (Signed)
Subjective: Wesley [name] is still complaining of shortness of breath. Wesley [name] did get up and move some. Wesley [name] is coughing but not able to bring anything up.  Objective: Vital signs in last 24 hours: Temp:  [97.3 F (36.3 C)-97.6 F (36.4 C)] 97.6 F (36.4 C) (11/01 0459) Pulse Rate:  [83-112] 83  (11/01 0459) Resp:  [16-18] 16  (11/01 0459) BP: (94-104)/(58-65) 94/58 mmHg (11/01 0459) SpO2:  [92 %-95 %] 94 % (11/01 0720) Weight:  [110.8 kg (244 lb 4.3 oz)] 110.8 kg (244 lb 4.3 oz) (10/31 1648) Weight change:  Last BM Date: 12/30/11  Intake/Output from previous day: 10/31 0701 - 11/01 0700 In: 1130 [P.O.:1080; IV Piggyback:50] Out: -   PHYSICAL EXAM General appearance: alert and mild distress Resp: rhonchi bilaterally Cardio: regular rate and rhythm, S1, S2 normal, no murmur, click, rub or gallop GI: soft, non-tender; bowel sounds normal; no masses,  no organomegaly Extremities: extremities normal, atraumatic, no cyanosis or edema  Lab Results:    Basic Metabolic Panel:  Basename 01/03/12 1817 01/02/12 1321  NA 136 134*  K 3.3* 4.0  CL 92* 92*  CO2 33* 31  GLUCOSE 220* 118*  BUN 21 16  CREATININE 0.95 0.81  CALCIUM 9.8 10.1  MG -- --  PHOS -- --   Liver Function Tests: No results found for this basename: AST:2,ALT:2,ALKPHOS:2,BILITOT:2,PROT:2,ALBUMIN:2 in the last 72 hours No results found for this basename: LIPASE:2,AMYLASE:2 in the last 72 hours No results found for this basename: AMMONIA:2 in the last 72 hours CBC:  Basename 01/03/12 0445 01/02/12 1321  WBC 21.0* 15.0*  NEUTROABS -- 12.1*  HGB 15.7 16.4  HCT 46.1 48.0  MCV 91.3 90.2  PLT 302 300   Cardiac Enzymes:  Basename 01/02/12 1321  CKTOTAL --  CKMB --  CKMBINDEX --  TROPONINI <0.30   BNP:  Basename 01/02/12 1321  PROBNP <5.0   D-Dimer: No results found for this basename: DDIMER:2 in the last 72 hours CBG:  Basename 01/05/12 0715 01/04/12 0756 01/03/12 1523 01/03/12 1029  GLUCAP 180* 206* 205* 192*    Hemoglobin A1C: No results found for this basename: HGBA1C in the last 72 hours Fasting Lipid Panel: No results found for this basename: CHOL,HDL,LDLCALC,TRIG,CHOLHDL,LDLDIRECT in the last 72 hours Thyroid Function Tests: No results found for this basename: TSH,T4TOTAL,FREET4,T3FREE,THYROIDAB in the last 72 hours Anemia Panel: No results found for this basename: VITAMINB12,FOLATE,FERRITIN,TIBC,IRON,RETICCTPCT in the last 72 hours Coagulation: No results found for this basename: LABPROT:2,INR:2 in the last 72 hours Urine Drug Screen: Drugs of Abuse  No results found for this basename: labopia, cocainscrnur, labbenz, amphetmu, thcu, labbarb    Alcohol Level: No results found for this basename: ETH:2 in the last 72 hours Urinalysis: No results found for this basename: COLORURINE:2,APPERANCEUR:2,LABSPEC:2,PHURINE:2,GLUCOSEU:2,HGBUR:2,BILIRUBINUR:2,KETONESUR:2,PROTEINUR:2,UROBILINOGEN:2,NITRITE:2,LEUKOCYTESUR:2 in the last 72 hours Misc. Labs:  ABGS No results found for this basename: PHART,PCO2,PO2ART,TCO2,HCO3 in the last 72 hours CULTURES Recent Results (from the past 240 hour(s))  MRSA PCR SCREENING     Status: Normal   Collection Time   01/02/12  8:37 PM      Component Value Range Status Comment   MRSA by PCR NEGATIVE  NEGATIVE Final    Studies/Results: No results found.  Medications:  Scheduled:   . albuterol  2.5 mg Nebulization QID  . antiseptic oral rinse  15 mL Mouth Rinse BID  . aspirin EC  81 mg Oral Daily  . cefTRIAXone (ROCEPHIN) IVPB 1 gram/50 mL D5W  1 g Intravenous Q24H  . docusate sodium  100  mg Oral BID  . enoxaparin  40 mg Subcutaneous Q24H  . ibuprofen  400 mg Oral TID WC  . mometasone-formoterol  2 puff Inhalation BID  . nicotine  21 mg Transdermal Q24H  . potassium chloride  20 mEq Oral TID  . predniSONE  60 mg Oral BID WC  . roflumilast  500 mcg Oral Daily  . tiotropium  18 mcg Inhalation Daily   Continuous:  [id number], albuterol,  ALPRAZolam, HYDROmorphone (DILAUDID) injection, magnesium hydroxide, oxycodone  Assesment: Wesley [name] has COPD which I think is end stage. Wesley [name] has bullae in the upper lobes. Wesley [name] says Wesley [name]'s only able to get comfortable when Wesley [name] is leaning forward and I think that's because Wesley [name] gets the diaphragmatic mechanical advantage when Wesley [name] is breathing that way. Wesley [name] is coughing but not able to cough anything up. Principal Problem:  *COPD exacerbation Active Problems:  Chronic pain  Opiate dependence  Chronic respiratory failure  Pulmonary infection    Plan: Add a flutter valve. Once Wesley [name] is improved Wesley [name] will need surgical consultation to see if Wesley [name]'s a candidate for lung reduction surgery. His COPD may be too far advanced    LOS: 3 days   Deem Marmol L 01/05/2012, 8:37 AM

## 2012-01-05 NOTE — Progress Notes (Signed)
Patient requesting bed alarm to be off.  Patient states he is stable and able to ambulate on his own.  Patient left off of alarm per his request.  Patient told to call for assistance if necessary.

## 2012-01-06 DIAGNOSIS — F1110 Opioid abuse, uncomplicated: Secondary | ICD-10-CM

## 2012-01-06 DIAGNOSIS — J441 Chronic obstructive pulmonary disease with (acute) exacerbation: Secondary | ICD-10-CM

## 2012-01-06 DIAGNOSIS — F1120 Opioid dependence, uncomplicated: Secondary | ICD-10-CM

## 2012-01-06 DIAGNOSIS — F17200 Nicotine dependence, unspecified, uncomplicated: Secondary | ICD-10-CM

## 2012-01-06 DIAGNOSIS — J9610 Chronic respiratory failure, unspecified whether with hypoxia or hypercapnia: Secondary | ICD-10-CM

## 2012-01-06 DIAGNOSIS — J984 Other disorders of lung: Secondary | ICD-10-CM

## 2012-01-06 MED ORDER — FUROSEMIDE 80 MG PO TABS: 80.0000 mg | ORAL_TABLET | Freq: Every day | ORAL | Status: DC | PRN

## 2012-01-06 MED ORDER — CEFUROXIME AXETIL 500 MG PO TABS: 500.0000 mg | ORAL_TABLET | Freq: Two times a day (BID) | ORAL | Status: DC

## 2012-01-06 MED ORDER — ROFLUMILAST 500 MCG PO TABS: 500.0000 ug | ORAL_TABLET | Freq: Every day | ORAL | Status: DC

## 2012-01-06 MED ORDER — OXYCODONE HCL 15 MG PO TABS: 15.0000 mg | ORAL_TABLET | Freq: Three times a day (TID) | ORAL | Status: DC

## 2012-01-06 MED ORDER — PREDNISONE 10 MG PO TABS: ORAL_TABLET | ORAL | Status: DC

## 2012-01-06 MED ORDER — AZITHROMYCIN 250 MG PO TABS: 250.0000 mg | ORAL_TABLET | Freq: Every day | ORAL | Status: DC

## 2012-01-06 MED ORDER — ALBUTEROL SULFATE HFA 108 (90 BASE) MCG/ACT IN AERS: 2.0000 | INHALATION_SPRAY | RESPIRATORY_TRACT | Status: DC | PRN

## 2012-01-06 NOTE — Progress Notes (Signed)
Patient received discharge instructions along with follow up appointments and prescriptions. Patient verbalized understanding of all instructions. Patient was escorted by staff via wheelchair to vehicle. Patient discharged to home in stable condition. 

## 2012-01-06 NOTE — Progress Notes (Deleted)
Physician Discharge Summary   Patient ID:  Wesley [name]  MRN: [id number]  DOB/AGE: 48/03/1965 48 y.o.  Admit date: 01/02/2012  Discharge date: 01/06/2012  Discharge Diagnoses:  Principal Problem:  *COPD exacerbation  Active Problems:  Chronic pain  Pulmonary infection acute bronchitis versus pneumonia.  Chronic respiratory failure  Opiate abuse, continuous  Opiate dependence    Medication List      As of 01/06/2012 9:30 AM     TAKE these medications        albuterol 108 (90 BASE) MCG/ACT inhaler     Commonly known as: PROVENTIL HFA;VENTOLIN HFA     Inhale 2 puffs into the lungs every 4 (four) hours as needed for wheezing or shortness of breath. Shortness of breath     aspirin EC 81 MG tablet     Take 81 mg by mouth daily.     azithromycin 250 MG tablet     Commonly known as: ZITHROMAX     Take 1 tablet (250 mg total) by mouth daily.     cefUROXime 500 MG tablet     Commonly known as: CEFTIN     Take 1 tablet (500 mg total) by mouth 2 (two) times daily with a meal.     Fluticasone-Salmeterol 500-50 MCG/DOSE Aepb     Commonly known as: ADVAIR     Inhale 1 puff into the lungs every 12 (twelve) hours.     furosemide 80 MG tablet     Commonly known as: LASIX     Take 1 tablet (80 mg total) by mouth daily as needed.     nicotine 21 mg/24hr patch     Commonly known as: NICODERM CQ - dosed in mg/24 hours     Place 1 patch onto the skin daily.     oxyCODONE 15 MG immediate release tablet     Commonly known as: ROXICODONE     Take 1 tablet (15 mg total) by mouth every 8 (eight) hours. 9 tablets dispensed     predniSONE 10 MG tablet     Commonly known as: DELTASONE     4 tablets daily for 2 days, then decrease by 1 tab every 2 days until off.     roflumilast 500 MCG Tabs tablet     Commonly known as: DALIRESP     Take 1 tablet (500 mcg total) by mouth daily.     tiotropium 18 MCG inhalation capsule     Commonly known as: SPIRIVA     Place 1 capsule (18 mcg total) into  inhaler and inhale daily.          Discharge Orders    Future Orders  Please Complete By  Expires    Diet general      Activity as tolerated - No restrictions        Call Dr. Doonquah's office on Monday, 01/08/2012  Disposition: 01-Home or Self Care  Discharged Condition: stable  Consults: Treatment Team:  Edward L Hawkins, MD  Labs:   Sodium     134  136         Potassium     4.0  3.3         Chloride     92  92         CO2     31  33         BUN     16  21         Creatinine, Ser       0.81  0.95         Calcium     10.1  9.8         GFR calc non Af Amer     90 mL/min">90  90 mL/min">90         GFR calc Af Amer     90 mL/min The eGFR has been calculated using the CKD EPI equation. This calculation has not been validated in all clinical situations. eGFR's persistently <90 mL/min signify possible Chronic Kidney Disease.">9090 mL/min The eGFR has been calculated using the CKD EPI equation. This calculation has not been validated in all clinical situations. eGFR's persistently <90 mL/min signify possible Chronic Kidney Disease." border=0 src="file:///C:/PROGRAM%20FILES%20(X86)/EPICSYS/V7.8/EN-US/Images/IP_COMMENT_EXIST.gif" width=5 height=10  90 mL/min The eGFR has been calculated using the CKD EPI equation. This calculation has not been validated in all clinical situations. eGFR's persistently <90 mL/min signify possible Chronic Kidney Disease.">9090 mL/min The eGFR has been calculated using the CKD EPI equation. This calculation has not been validated in all clinical situations. eGFR's persistently <90 mL/min signify possible Chronic Kidney Disease." border=0 src="file:///C:/PROGRAM%20FILES%20(X86)/EPICSYS/V7.8/EN-US/Images/IP_COMMENT_EXIST.gif" width=5 height=10         Glucose, Bld     118  220          CARDIAC PROFILE     Troponin I     <0.30          Pro B Natriuretic peptide (BNP)     <5.0           CBC     WBC     21.0          RBC     5.05          Hemoglobin     15.7           HCT     46.1          MCV     91.3          MCH     31.1          MCHC     34.1          RDW     11.8          Platelets     302           DIFFERENTIAL     Neutrophils Relative     81          Lymphocytes Relative     11          Monocytes Relative     7          Eosinophils Relative     1          Basophils Relative     0          Neutro Abs     12.1          Lymphs Abs     1.6          Monocytes Absolute     1.1          Eosinophils Absolute     0.2          Basophils Absolute     0.0         Diagnostics: Dg Chest Portable 1 View  01/02/2012 *RADIOLOGY REPORT* Clinical Data: Shortness of breath PORTABLE CHEST - 1 VIEW Comparison: Jul 23, 2011 Findings: There is underlying emphysema with extensive bullous disease in the upper lobes, stable.   There is currently no edema or consolidation. There is some interstitial prominence in several areas, felt to be due to redistribution of blood flow to viable segments of lung. Heart size is normal. Pulmonary vascularity is stable and reflects underlying emphysema. No adenopathy. No bone lesions. IMPRESSION: Extensive bullous emphysema. No edema or consolidation. Original Report Authenticated By: WILLLIAM W. WOODRUFF III, M.D.  Procedures: none  EKG: Sinus tachycardia  Biatrial enlargement  Right superior axis deviation  Right ventricular hypertrophy  Inferior infarct , age undetermined  Anterior infarct (cited on or before 18-Jul-2011)  Abnormal ECG  When compared with ECG of 18-Jul-2011 14:45,  No significant change was found  Hospital Course: He H&P for complete admission details. The patient is a 46-year-old white male, previous smoker with severe COPD, oxygen dependent. He presented with shortness of breath and chest pain" from coughing". He had previously been followed by Dr. Fusco, but was reportedly fired because of pain medications. The patient reported that his primary care physician is Dr. Hawkins. Dr. Hawkins reports that this  is not the case, he follows the patient for COPD. On presentation, patient had a leukocytosis. Chest x-ray showed no infiltrate. He had expiratory wheezing. He was mildly tachycardic. Dr. Hawkins consulted and the hospitalist took over care. Dr. Hawkins felt that clinically this was a pneumonia, and patient was treated with antibiotics, oxygen, bronchodilators. Unfortunately, patient's stay was dominated by drug-seeking behavior. The patient initially reported that he had been on 30 mg tablets of oxycodone, one to 2 tablets every 4 hours as needed for pain. In addition to this, he was oxycodone, and was noted to be chewing his oxycodone. Patient's COPD slowly improved. He is felt to be end-stage, and Dr. Hawkins will refer him to thoracic surgery to consider lung reduction surgery. Patient reports that he no longer smokes. I spoke with patient's pain management provider, Ms. Marilyn Wade Harris. She reports the patient's oxycodone is prescribed as 15 mg every 8 hours and 90 tablets are dispensed at a time. She reports inconsistencies in his drug testing, being positive for morphine and hydromorphone. Patient was confronted about his inconsistencient reports of home pain medication regimen, chewing his oxycodone tablets, drug seeking, et cetera. Ms. Wade Harris has recommended giving only enough 15 mg tablets of oxycodone and she will followup the patient on Monday. He has been cleared by Dr. Hawkins for discharge. Total time on the day of discharge greater than 30 minutes.  Discharge Exam:  Blood pressure 121/81, pulse 97, temperature 97.9 F (36.6 C), temperature source Oral, resp. rate 18, height 6' 1" (1.854 m), weight 110.8 kg (244 lb 4.3 oz), SpO2 93.00%.  Gen.: Comfortable. Talkative. No labored breathing.  Lungs cattered rhonchi. Diminished throughout.  Cardiovascular regular rate rhythm without murmurs gas rubs  extremities no clubbing cyanosis or edema.  Signed:  Develle Sievers L  01/06/2012,  9:30 AM  

## 2012-01-16 NOTE — Discharge Summary (Signed)
Physician Discharge Summary   Patient ID:  Wesley [name]  MRN: [id number]  DOB/AGE: March 28, 1965 46 y.o.  Admit date: 01/02/2012  Discharge date: 01/06/2012  Discharge Diagnoses:  Principal Problem:  *COPD exacerbation  Active Problems:  Chronic pain  Pulmonary infection acute bronchitis versus pneumonia.  Chronic respiratory failure  Opiate abuse, continuous  Opiate dependence    Medication List      As of 01/06/2012 9:30 AM     TAKE these medications        albuterol 108 (90 BASE) MCG/ACT inhaler     Commonly known as: PROVENTIL HFA;VENTOLIN HFA     Inhale 2 puffs into the lungs every 4 (four) hours as needed for wheezing or shortness of breath. Shortness of breath     aspirin EC 81 MG tablet     Take 81 mg by mouth daily.     azithromycin 250 MG tablet     Commonly known as: ZITHROMAX     Take 1 tablet (250 mg total) by mouth daily.     cefUROXime 500 MG tablet     Commonly known as: CEFTIN     Take 1 tablet (500 mg total) by mouth 2 (two) times daily with a meal.     Fluticasone-Salmeterol 500-50 MCG/DOSE Aepb     Commonly known as: ADVAIR     Inhale 1 puff into the lungs every 12 (twelve) hours.     furosemide 80 MG tablet     Commonly known as: LASIX     Take 1 tablet (80 mg total) by mouth daily as needed.     nicotine 21 mg/24hr patch     Commonly known as: NICODERM CQ - dosed in mg/24 hours     Place 1 patch onto the skin daily.     oxyCODONE 15 MG immediate release tablet     Commonly known as: ROXICODONE     Take 1 tablet (15 mg total) by mouth every 8 (eight) hours. 9 tablets dispensed     predniSONE 10 MG tablet     Commonly known as: DELTASONE     4 tablets daily for 2 days, then decrease by 1 tab every 2 days until off.     roflumilast 500 MCG Tabs tablet     Commonly known as: DALIRESP     Take 1 tablet (500 mcg total) by mouth daily.     tiotropium 18 MCG inhalation capsule     Commonly known as: SPIRIVA     Place 1 capsule (18 mcg total) into  inhaler and inhale daily.          Discharge Orders    Future Orders  Please Complete By  Expires    Diet general      Activity as tolerated - No restrictions        Call Dr. Ronal Fear office on Monday, 01/08/2012  Disposition: 01-Home or Self Care  Discharged Condition: stable  Consults: Treatment Team:  Fredirick Maudlin, MD  Labs:   Sodium     134  136         Potassium     4.0  3.3         Chloride     92  92         CO2     31  33         BUN     16  21         Creatinine, Ser  0.81  0.95         Calcium     10.1  9.8         GFR calc non Af Amer     90 mL/min">90  90 mL/min">90         GFR calc Af Amer     90 mL/min The eGFR has been calculated using the CKD EPI equation. This calculation has not been validated in all clinical situations. eGFR's persistently <90 mL/min signify possible Chronic Kidney Disease.">9090 mL/min The eGFR has been calculated using the CKD EPI equation. This calculation has not been validated in all clinical situations. eGFR's persistently <90 mL/min signify possible Chronic Kidney Disease." border=0 src="file:///C:/PROGRAM%20FILES%20(X86)/EPICSYS/V7.8/EN-US/Images/IP_COMMENT_EXIST.gif" width=5 height=10  90 mL/min The eGFR has been calculated using the CKD EPI equation. This calculation has not been validated in all clinical situations. eGFR's persistently <90 mL/min signify possible Chronic Kidney Disease.">9090 mL/min The eGFR has been calculated using the CKD EPI equation. This calculation has not been validated in all clinical situations. eGFR's persistently <90 mL/min signify possible Chronic Kidney Disease." border=0 src="file:///C:/PROGRAM%20FILES%20(X86)/EPICSYS/V7.8/EN-US/Images/IP_COMMENT_EXIST.gif" width=5 height=10         Glucose, Bld     118  220          CARDIAC PROFILE     Troponin I     <0.30          Pro B Natriuretic peptide (BNP)     <5.0           CBC     WBC     21.0          RBC     5.05          Hemoglobin     15.7           HCT     46.1          MCV     91.3          MCH     31.1          MCHC     34.1          RDW     11.8          Platelets     302           DIFFERENTIAL     Neutrophils Relative     81          Lymphocytes Relative     11          Monocytes Relative     7          Eosinophils Relative     1          Basophils Relative     0          Neutro Abs     12.1          Lymphs Abs     1.6          Monocytes Absolute     1.1          Eosinophils Absolute     0.2          Basophils Absolute     0.0         Diagnostics: Dg Chest Portable 1 View  01/02/2012 *RADIOLOGY REPORT* Clinical Data: Shortness of breath PORTABLE CHEST - 1 VIEW Comparison: Jul 23, 2011 Findings: There is underlying emphysema with extensive bullous disease in the upper lobes, stable.  There is currently no edema or consolidation. There is some interstitial prominence in several areas, felt to be due to redistribution of blood flow to viable segments of lung. Heart size is normal. Pulmonary vascularity is stable and reflects underlying emphysema. No adenopathy. No bone lesions. IMPRESSION: Extensive bullous emphysema. No edema or consolidation. Original Report Authenticated By: Arvin Collard. WOODRUFF III, M.D.  Procedures: none  EKG: Sinus tachycardia  Biatrial enlargement  Right superior axis deviation  Right ventricular hypertrophy  Inferior infarct , age undetermined  Anterior infarct (cited on or before 18-Jul-2011)  Abnormal ECG  When compared with ECG of 18-Jul-2011 14:45,  No significant change was found  Hospital Course: He H&P for complete admission details. The patient is a 46 year old white male, previous smoker with severe COPD, oxygen dependent. He presented with shortness of breath and chest pain" from coughing". He had previously been followed by Dr. Sherwood Gambler, but was reportedly fired because of pain medications. The patient reported that his primary care physician is Dr. [name]. Dr. [name] reports that this  is not the case, he follows the patient for COPD. On presentation, patient had a leukocytosis. Chest x-ray showed no infiltrate. He had expiratory wheezing. He was mildly tachycardic. Dr. [name] consulted and the hospitalist took over care. Dr. [name] felt that clinically this was a pneumonia, and patient was treated with antibiotics, oxygen, bronchodilators. Unfortunately, patient's stay was dominated by drug-seeking behavior. The patient initially reported that he had been on 30 mg tablets of oxycodone, one to 2 tablets every 4 hours as needed for pain. In addition to this, he was oxycodone, and was noted to be chewing his oxycodone. Patient's COPD slowly improved. He is felt to be end-stage, and Dr. [name] will refer him to thoracic surgery to consider lung reduction surgery. Patient reports that he no longer smokes. I spoke with patient's pain management provider, Ms. Lowella Curb. She reports the patient's oxycodone is prescribed as 15 mg every 8 hours and 90 tablets are dispensed at a time. She reports inconsistencies in his drug testing, being positive for morphine and hydromorphone. Patient was confronted about his inconsistencient reports of home pain medication regimen, chewing his oxycodone tablets, drug seeking, et Karie Soda. Ms. Loma Sender has recommended giving only enough 15 mg tablets of oxycodone and she will followup the patient on Monday. He has been cleared by Dr. [name] for discharge. Total time on the day of discharge greater than 30 minutes.  Discharge Exam:  Blood pressure 121/81, pulse 97, temperature 97.9 F (36.6 C), temperature source Oral, resp. rate 18, height 6\' 1"  (1.854 m), weight 110.8 kg (244 lb 4.3 oz), SpO2 93.00%.  Gen.: Comfortable. Talkative. No labored breathing.  Lungs cattered rhonchi. Diminished throughout.  Cardiovascular regular rate rhythm without murmurs gas rubs  extremities no clubbing cyanosis or edema.  SignedChristiane Ha  01/06/2012,  9:30 AM

## 2012-06-14 ENCOUNTER — Emergency Department (HOSPITAL_COMMUNITY)
Admission: EM | Admit: 2012-06-14 | Discharge: 2012-06-14 | Disposition: A | Discharge status: Home / Self Care | Payer: Medicaid Other | Attending: Emergency Medicine | Admitting: Emergency Medicine

## 2012-06-14 ENCOUNTER — Encounter (HOSPITAL_COMMUNITY): Payer: Self-pay | Admitting: *Deleted

## 2012-06-14 ENCOUNTER — Emergency Department (HOSPITAL_COMMUNITY): Payer: Medicaid Other

## 2012-06-14 DIAGNOSIS — M545 Low back pain, unspecified: Secondary | ICD-10-CM | POA: Insufficient documentation

## 2012-06-14 DIAGNOSIS — Z7982 Long term (current) use of aspirin: Secondary | ICD-10-CM | POA: Insufficient documentation

## 2012-06-14 DIAGNOSIS — J449 Chronic obstructive pulmonary disease, unspecified: Secondary | ICD-10-CM | POA: Insufficient documentation

## 2012-06-14 DIAGNOSIS — Z96649 Presence of unspecified artificial hip joint: Secondary | ICD-10-CM | POA: Insufficient documentation

## 2012-06-14 DIAGNOSIS — Y929 Unspecified place or not applicable: Secondary | ICD-10-CM | POA: Insufficient documentation

## 2012-06-14 DIAGNOSIS — W208XXA Other cause of strike by thrown, projected or falling object, initial encounter: Secondary | ICD-10-CM | POA: Insufficient documentation

## 2012-06-14 DIAGNOSIS — M549 Dorsalgia, unspecified: Secondary | ICD-10-CM

## 2012-06-14 DIAGNOSIS — Z79899 Other long term (current) drug therapy: Secondary | ICD-10-CM | POA: Insufficient documentation

## 2012-06-14 DIAGNOSIS — J4489 Other specified chronic obstructive pulmonary disease: Secondary | ICD-10-CM | POA: Insufficient documentation

## 2012-06-14 DIAGNOSIS — Y9389 Activity, other specified: Secondary | ICD-10-CM | POA: Insufficient documentation

## 2012-06-14 DIAGNOSIS — Z8739 Personal history of other diseases of the musculoskeletal system and connective tissue: Secondary | ICD-10-CM | POA: Insufficient documentation

## 2012-06-14 DIAGNOSIS — S60212A Contusion of left wrist, initial encounter: Secondary | ICD-10-CM

## 2012-06-14 DIAGNOSIS — Z87891 Personal history of nicotine dependence: Secondary | ICD-10-CM | POA: Insufficient documentation

## 2012-06-14 DIAGNOSIS — G8929 Other chronic pain: Secondary | ICD-10-CM | POA: Insufficient documentation

## 2012-06-14 DIAGNOSIS — S60219A Contusion of unspecified wrist, initial encounter: Secondary | ICD-10-CM | POA: Insufficient documentation

## 2012-06-14 MED ORDER — DIAZEPAM 5 MG PO TABS
5.0000 mg | ORAL_TABLET | Freq: Once | ORAL | Status: AC
  Administered 2012-06-14: 5 mg via ORAL
  Filled 2012-06-14: qty 1

## 2012-06-14 MED ORDER — OXYCODONE-ACETAMINOPHEN 5-325 MG PO TABS: 1.0000 | ORAL_TABLET | ORAL | Status: DC | PRN

## 2012-06-14 MED ORDER — CYCLOBENZAPRINE HCL 10 MG PO TABS: 10.0000 mg | ORAL_TABLET | Freq: Two times a day (BID) | ORAL | Status: DC | PRN

## 2012-06-14 MED ORDER — IBUPROFEN 600 MG PO TABS: 600.0000 mg | ORAL_TABLET | Freq: Three times a day (TID) | ORAL | Status: DC | PRN

## 2012-06-14 MED ORDER — HYDROMORPHONE HCL PF 1 MG/ML IJ SOLN
1.0000 mg | Freq: Once | INTRAMUSCULAR | Status: AC
  Administered 2012-06-14: 1 mg via INTRAVENOUS
  Filled 2012-06-14: qty 1

## 2012-06-14 MED ORDER — KETOROLAC TROMETHAMINE 30 MG/ML IJ SOLN
30.0000 mg | Freq: Once | INTRAMUSCULAR | Status: AC
  Administered 2012-06-14: 30 mg via INTRAVENOUS
  Filled 2012-06-14: qty 1

## 2012-06-14 MED ORDER — ALBUTEROL SULFATE (5 MG/ML) 0.5% IN NEBU
2.5000 mg | INHALATION_SOLUTION | Freq: Once | RESPIRATORY_TRACT | Status: AC
  Administered 2012-06-14: 2.5 mg via RESPIRATORY_TRACT
  Filled 2012-06-14: qty 0.5

## 2012-06-14 MED ORDER — IPRATROPIUM BROMIDE 0.02 % IN SOLN
0.5000 mg | Freq: Once | RESPIRATORY_TRACT | Status: AC
  Administered 2012-06-14: 0.5 mg via RESPIRATORY_TRACT
  Filled 2012-06-14: qty 2.5

## 2012-06-14 NOTE — ED Notes (Signed)
Got patient up to walk, doctor came in seen he was in a lot of pain and stiff. He instructed patient to just stand straight up. Patient was able to do this but in a lot of pain. Placed patient back in a chair per doctor.

## 2012-06-14 NOTE — ED Provider Notes (Signed)
History     CSN: [id number]  Arrival date & time 06/14/12  1616   First MD Initiated Contact with Patient 06/14/12 1704      Chief Complaint  Patient presents with  . Back Pain  . Wrist Pain     HPI The patient reports developing back pain that is worsened over the past 24 hours.  He was working in front of a [employer] yesterday that fell onto him.  He had to push the fluid off of him.  He reports pain to his left breast and pain in his back.  He is not sure how he fell.  He reports that the soreness in his back has continued to worsen.  He reports difficulty walking secondary to the pain.  He denies numbness and tingling in his legs.  He reports when he tries to stand up straight he feels weak in his legs but it seems as though this is more related to the severity of his back pain.  No fecal or urinary incontinence.  The patient is not on any anticoagulants.  Pain with range of motion of his left wrist but no other complaints.  No chest pain shortness breath.  He does have a history of COPD.  Nursing felt as though the patient was wheezing on arrival and ordered him a breathing treatment.  The patient denies shortness of breath.  His pain at this time is severe.  He tried over-the-counter medications for his pain.  He has a history of prior back injury and back pain.    Past Medical History  Diagnosis Date  . COPD (chronic obstructive pulmonary disease)     Past Surgical History  Procedure Laterality Date  . Joint replacement    . Back surgery      No family history on file.  History  Substance Use Topics  . Smoking status: Former Smoker -- 1.00 packs/day    Quit date: 07/05/2011  . Smokeless tobacco: Not on file  . Alcohol Use: No      Review of Systems  All other systems reviewed and are negative.    Allergies  Ambien  Home Medications   Current Outpatient Rx  Name  Route  Sig  Dispense  Refill  . albuterol (PROVENTIL HFA;VENTOLIN HFA) 108 (90 BASE) MCG/ACT  inhaler   Inhalation   Inhale 2 puffs into the lungs every 4 (four) hours as needed for wheezing or shortness of breath. Shortness of breath   1 Inhaler   0   . albuterol (PROVENTIL) (2.5 MG/3ML) 0.083% nebulizer solution   Nebulization   Take 2.5 mg by nebulization every 6 (six) hours as needed for wheezing.         Marland Kitchen aspirin EC 81 MG tablet   Oral   Take 81 mg by mouth daily.         . Fluticasone-Salmeterol (ADVAIR) 500-50 MCG/DOSE AEPB   Inhalation   Inhale 1 puff into the lungs every 12 (twelve) hours.   60 each   0   . furosemide (LASIX) 80 MG tablet   Oral   Take 1 tablet (80 mg total) by mouth daily as needed.   60 tablet   0   . methocarbamol (ROBAXIN) 500 MG tablet   Oral   Take 500 mg by mouth once as needed (for pain).         Bertram Gala Glycol-Propyl Glycol (SYSTANE) 0.4-0.3 % SOLN   Ophthalmic   Apply 1-2 drops to eye  daily.         . roflumilast (DALIRESP) 500 MCG TABS tablet   Oral   Take 1 tablet (500 mcg total) by mouth daily.   30 tablet   0   . tiotropium (SPIRIVA) 18 MCG inhalation capsule   Inhalation   Place 1 capsule (18 mcg total) into inhaler and inhale daily.   30 capsule   0   . cyclobenzaprine (FLEXERIL) 10 MG tablet   Oral   Take 1 tablet (10 mg total) by mouth 2 (two) times daily as needed for muscle spasms.   20 tablet   0   . ibuprofen (ADVIL,MOTRIN) 600 MG tablet   Oral   Take 1 tablet (600 mg total) by mouth every 8 (eight) hours as needed for pain.   15 tablet   0   . oxyCODONE-acetaminophen (PERCOCET/ROXICET) 5-325 MG per tablet   Oral   Take 1 tablet by mouth every 4 (four) hours as needed for pain.   20 tablet   0     BP 155/109  Pulse 81  Temp(Src) 97.4 F (36.3 C) (Oral)  Resp 18  Ht 6' (1.829 m)  Wt 160 lb (72.576 kg)  BMI 21.7 kg/m2  SpO2 95%  Physical Exam  Nursing note and vitals reviewed. Constitutional: He is oriented to person, place, and time. He appears well-developed and  well-nourished.  HENT:  Head: Normocephalic and atraumatic.  Eyes: EOM are normal.  Neck: Normal range of motion.  Cardiovascular: Normal rate, regular rhythm, normal heart sounds and intact distal pulses.   Pulmonary/Chest: Effort normal and breath sounds normal. No respiratory distress.  Abdominal: Soft. He exhibits no distension. There is no tenderness.  Musculoskeletal: Normal range of motion.  L-spine sacral and paralumbar tenderness without step-off.  No bruising or ecchymosis noted.  5 out of 5 strength in bilateral lower extremity major muscle groups.  No cervical or thoracic tenderness.  Patient with bruising to his anterior left breast without significant swelling.  The bruising appears old.  Mild pain with range of motion of his left wrist.  Normal left radial pulse  Neurological: He is alert and oriented to person, place, and time.  Skin: Skin is warm and dry.  Psychiatric: He has a normal mood and affect. Judgment normal.    ED Course  Procedures (including critical care time)  Labs Reviewed - No data to display Dg Lumbar Spine Complete  06/14/2012  *RADIOLOGY REPORT*  Clinical Data: Crush injury, pain.  LUMBAR SPINE - COMPLETE 4+ VIEW  Comparison: MR lumbar spine 06/18/2004.  Findings:   No fracture or subluxation is identified. Intervertebral disc space height is maintained.  Paraspinous structures are paraspinous structures demonstrate partial visualization of a left hip replacement.  Normal appearing lumbar spine.  No acute abnormality.   Original Report Authenticated By: Holley Dexter, M.D.    Dg Pelvis 1-2 Views  06/14/2012  *RADIOLOGY REPORT*  Clinical Data: Injury, pain.  PELVIS - 1-2 VIEW  Comparison: MRI of the right hip 07/12/2009.  Findings: No acute bony or joint abnormality is identified.  There is avascular necrosis of the right femoral head with femoral head collapse and fragmentation.  Left hip replacement is partially visualized.  IMPRESSION:  1.  No acute  finding. 2.  Avascular necrosis right hip with femoral head collapse.   Original Report Authenticated By: Holley Dexter, M.D.    Dg Wrist Complete Left  06/14/2012  *RADIOLOGY REPORT*  Clinical Data: Crush injury, pain.  LEFT WRIST -  COMPLETE 3+ VIEW  Comparison: Plain films 10/02/2005.  Findings: Imaged bones, joints and soft tissues demonstrate no acute abnormality. Small radiopaque foreign body in distal ring finger is unchanged.  IMPRESSION: No acute finding.   Original Report Authenticated By: Holley Dexter, M.D.    I personally reviewed the imaging tests through PACS system I reviewed available ER/hospitalization records through the EMR   1. Back pain   2. Wrist contusion, left, initial encounter       MDM  9:39 PM The patient reports his pain is better at this time.  He is ambulatory in the emergency department.  Home with pain medicine and muscle relaxants.  Doubt cauda equina.  Understands return to the ER for new or worsening symptoms.        Lyanne Co, MD 06/14/12 [phone]

## 2012-06-14 NOTE — ED Notes (Signed)
Pt reports he is aware he is not supposed to be driving due to the meds that he received here could make him sleepy.  Pt states he isgoing to sit in his truck until his friend comes to pick him up.  After leaving pt at truck, pt got right in and started to pull away.  [employer] officer informed of same and went to check on pt.

## 2012-06-14 NOTE — ED Notes (Signed)
Resp paged for breathing treatment.  

## 2012-06-14 NOTE — ED Notes (Signed)
C/o lower back pain and left wrist pain onset yesterday after a plywood wall fell on pt - states this wall landed on his back.  Reports today his legs will not hold him up and states that he feels like "knives are stabbing me in both my legs".

## 2012-06-14 NOTE — ED Notes (Signed)
MD at bedside. 

## 2012-08-06 ENCOUNTER — Inpatient Hospital Stay (HOSPITAL_COMMUNITY)
Admission: EM | Admit: 2012-08-06 | Discharge: 2012-08-07 | DRG: 191 | Disposition: A | Discharge status: Home / Self Care | Payer: Medicaid Other | Attending: Internal Medicine | Admitting: Internal Medicine

## 2012-08-06 ENCOUNTER — Inpatient Hospital Stay (HOSPITAL_COMMUNITY): Payer: Medicaid Other

## 2012-08-06 ENCOUNTER — Encounter (HOSPITAL_COMMUNITY): Payer: Self-pay

## 2012-08-06 ENCOUNTER — Emergency Department (HOSPITAL_COMMUNITY): Payer: Medicaid Other

## 2012-08-06 DIAGNOSIS — J9610 Chronic respiratory failure, unspecified whether with hypoxia or hypercapnia: Secondary | ICD-10-CM

## 2012-08-06 DIAGNOSIS — G8929 Other chronic pain: Secondary | ICD-10-CM

## 2012-08-06 DIAGNOSIS — M25559 Pain in unspecified hip: Secondary | ICD-10-CM | POA: Diagnosis present

## 2012-08-06 DIAGNOSIS — M810 Age-related osteoporosis without current pathological fracture: Secondary | ICD-10-CM

## 2012-08-06 DIAGNOSIS — F1110 Opioid abuse, uncomplicated: Secondary | ICD-10-CM

## 2012-08-06 DIAGNOSIS — R000 Tachycardia, unspecified: Secondary | ICD-10-CM

## 2012-08-06 DIAGNOSIS — Z87891 Personal history of nicotine dependence: Secondary | ICD-10-CM

## 2012-08-06 DIAGNOSIS — R509 Fever, unspecified: Secondary | ICD-10-CM

## 2012-08-06 DIAGNOSIS — J449 Chronic obstructive pulmonary disease, unspecified: Secondary | ICD-10-CM

## 2012-08-06 DIAGNOSIS — J441 Chronic obstructive pulmonary disease with (acute) exacerbation: Principal | ICD-10-CM

## 2012-08-06 DIAGNOSIS — Z9660 Presence of unspecified orthopedic joint implant: Secondary | ICD-10-CM

## 2012-08-06 DIAGNOSIS — Z720 Tobacco use: Secondary | ICD-10-CM

## 2012-08-06 DIAGNOSIS — J9620 Acute and chronic respiratory failure, unspecified whether with hypoxia or hypercapnia: Secondary | ICD-10-CM

## 2012-08-06 DIAGNOSIS — M791 Myalgia, unspecified site: Secondary | ICD-10-CM

## 2012-08-06 DIAGNOSIS — J189 Pneumonia, unspecified organism: Secondary | ICD-10-CM

## 2012-08-06 DIAGNOSIS — D72829 Elevated white blood cell count, unspecified: Secondary | ICD-10-CM

## 2012-08-06 DIAGNOSIS — E860 Dehydration: Secondary | ICD-10-CM

## 2012-08-06 DIAGNOSIS — Z79899 Other long term (current) drug therapy: Secondary | ICD-10-CM

## 2012-08-06 DIAGNOSIS — F1120 Opioid dependence, uncomplicated: Secondary | ICD-10-CM

## 2012-08-06 DIAGNOSIS — Z9981 Dependence on supplemental oxygen: Secondary | ICD-10-CM

## 2012-08-06 HISTORY — DX: Age-related osteoporosis without current pathological fracture: M81.0

## 2012-08-06 HISTORY — DX: Polyneuropathy, unspecified: G62.9

## 2012-08-06 LAB — BASIC METABOLIC PANEL
BUN: 15 mg/dL (ref 6–23)
CO2: 29 mEq/L (ref 19–32)
Calcium: 9.4 mg/dL (ref 8.4–10.5)
Chloride: 101 mEq/L (ref 96–112)
Creatinine, Ser: 0.96 mg/dL (ref 0.50–1.35)
Glucose, Bld: 107 mg/dL — ABNORMAL HIGH (ref 70–99)

## 2012-08-06 LAB — CBC WITH DIFFERENTIAL/PLATELET
Eosinophils Relative: 2 % (ref 0–5)
HCT: 47.9 % (ref 39.0–52.0)
Hemoglobin: 16.5 g/dL (ref 13.0–17.0)
Lymphocytes Relative: 14 % (ref 12–46)
Lymphs Abs: 2 10*3/uL (ref 0.7–4.0)
MCV: 93.4 fL (ref 78.0–100.0)
Monocytes Absolute: 1.5 10*3/uL — ABNORMAL HIGH (ref 0.1–1.0)
Monocytes Relative: 10 % (ref 3–12)
Neutro Abs: 10.5 10*3/uL — ABNORMAL HIGH (ref 1.7–7.7)
RBC: 5.13 MIL/uL (ref 4.22–5.81)
RDW: 12.5 % (ref 11.5–15.5)
WBC: 14.3 10*3/uL — ABNORMAL HIGH (ref 4.0–10.5)

## 2012-08-06 MED ORDER — IPRATROPIUM BROMIDE 0.02 % IN SOLN
0.5000 mg | Freq: Once | RESPIRATORY_TRACT | Status: AC
  Administered 2012-08-06: 0.5 mg via RESPIRATORY_TRACT
  Filled 2012-08-06: qty 2.5

## 2012-08-06 MED ORDER — ENOXAPARIN SODIUM 40 MG/0.4ML ~~LOC~~ SOLN
40.0000 mg | SUBCUTANEOUS | Status: DC
  Administered 2012-08-06: 40 mg via SUBCUTANEOUS
  Filled 2012-08-06: qty 0.4

## 2012-08-06 MED ORDER — HYDROCODONE-ACETAMINOPHEN 5-325 MG PO TABS
1.0000 | ORAL_TABLET | Freq: Once | ORAL | Status: AC
  Administered 2012-08-06: 1 via ORAL
  Filled 2012-08-06: qty 1

## 2012-08-06 MED ORDER — POLYETHYL GLYCOL-PROPYL GLYCOL 0.4-0.3 % OP SOLN: 1.0000 [drp] | Freq: Every day | OPHTHALMIC | Status: DC

## 2012-08-06 MED ORDER — METHYLPREDNISOLONE SODIUM SUCC 125 MG IJ SOLR
125.0000 mg | Freq: Once | INTRAMUSCULAR | Status: AC
  Administered 2012-08-06: 125 mg via INTRAVENOUS
  Filled 2012-08-06: qty 2

## 2012-08-06 MED ORDER — MELOXICAM 15 MG PO TABS
15.0000 mg | ORAL_TABLET | Freq: Every day | ORAL | Status: DC
  Filled 2012-08-06: qty 1

## 2012-08-06 MED ORDER — METHYLPREDNISOLONE SODIUM SUCC 125 MG IJ SOLR
125.0000 mg | Freq: Four times a day (QID) | INTRAMUSCULAR | Status: DC
  Administered 2012-08-07 (×3): 125 mg via INTRAVENOUS
  Filled 2012-08-06 (×3): qty 2

## 2012-08-06 MED ORDER — ONDANSETRON HCL 4 MG/2ML IJ SOLN: 4.0000 mg | INTRAMUSCULAR | Status: DC | PRN

## 2012-08-06 MED ORDER — MELOXICAM 7.5 MG PO TABS
ORAL_TABLET | ORAL | Status: AC
  Filled 2012-08-06: qty 2

## 2012-08-06 MED ORDER — PANTOPRAZOLE SODIUM 40 MG PO TBEC
40.0000 mg | DELAYED_RELEASE_TABLET | Freq: Two times a day (BID) | ORAL | Status: DC
  Administered 2012-08-07: 40 mg via ORAL
  Filled 2012-08-06: qty 1

## 2012-08-06 MED ORDER — POLYETHYLENE GLYCOL 3350 17 G PO PACK: 17.0000 g | PACK | Freq: Every day | ORAL | Status: DC | PRN

## 2012-08-06 MED ORDER — TRAZODONE HCL 50 MG PO TABS: 50.0000 mg | ORAL_TABLET | Freq: Every evening | ORAL | Status: DC | PRN

## 2012-08-06 MED ORDER — GUAIFENESIN ER 600 MG PO TB12
1200.0000 mg | ORAL_TABLET | Freq: Two times a day (BID) | ORAL | Status: DC
  Administered 2012-08-06: 1200 mg via ORAL
  Filled 2012-08-06: qty 2

## 2012-08-06 MED ORDER — BISACODYL 10 MG RE SUPP: 10.0000 mg | Freq: Every day | RECTAL | Status: DC | PRN

## 2012-08-06 MED ORDER — POLYVINYL ALCOHOL 1.4 % OP SOLN
1.0000 [drp] | Freq: Every day | OPHTHALMIC | Status: DC
  Administered 2012-08-07: 2 [drp] via OPHTHALMIC
  Filled 2012-08-06: qty 15

## 2012-08-06 MED ORDER — GUAIFENESIN 100 MG/5ML PO SOLN
200.0000 mg | ORAL | Status: DC | PRN
  Administered 2012-08-07 (×3): 200 mg via ORAL
  Filled 2012-08-06 (×3): qty 10

## 2012-08-06 MED ORDER — LORATADINE 10 MG PO TABS
10.0000 mg | ORAL_TABLET | Freq: Every day | ORAL | Status: DC
  Administered 2012-08-07: 10 mg via ORAL
  Filled 2012-08-06: qty 1

## 2012-08-06 MED ORDER — ALBUTEROL SULFATE (5 MG/ML) 0.5% IN NEBU
5.0000 mg | INHALATION_SOLUTION | Freq: Once | RESPIRATORY_TRACT | Status: AC
  Administered 2012-08-06: 5 mg via RESPIRATORY_TRACT
  Filled 2012-08-06: qty 1

## 2012-08-06 MED ORDER — LEVOFLOXACIN 500 MG PO TABS
500.0000 mg | ORAL_TABLET | Freq: Every day | ORAL | Status: DC
  Administered 2012-08-06 – 2012-08-07 (×2): 500 mg via ORAL
  Filled 2012-08-06 (×2): qty 1

## 2012-08-06 MED ORDER — MELOXICAM 7.5 MG PO TABS
15.0000 mg | ORAL_TABLET | Freq: Every day | ORAL | Status: DC
  Administered 2012-08-06 – 2012-08-07 (×2): 15 mg via ORAL
  Filled 2012-08-06: qty 1
  Filled 2012-08-06 (×2): qty 2
  Filled 2012-08-06: qty 1

## 2012-08-06 MED ORDER — PANTOPRAZOLE SODIUM 40 MG IV SOLR
40.0000 mg | Freq: Once | INTRAVENOUS | Status: AC
  Administered 2012-08-06: 40 mg via INTRAVENOUS
  Filled 2012-08-06: qty 40

## 2012-08-06 MED ORDER — FLEET ENEMA 7-19 GM/118ML RE ENEM: 1.0000 | ENEMA | Freq: Once | RECTAL | Status: AC | PRN

## 2012-08-06 MED ORDER — MOMETASONE FURO-FORMOTEROL FUM 200-5 MCG/ACT IN AERO
2.0000 | INHALATION_SPRAY | Freq: Two times a day (BID) | RESPIRATORY_TRACT | Status: DC
  Administered 2012-08-07: 2 via RESPIRATORY_TRACT
  Filled 2012-08-06: qty 8.8

## 2012-08-06 MED ORDER — ALBUTEROL SULFATE (5 MG/ML) 0.5% IN NEBU
2.5000 mg | INHALATION_SOLUTION | RESPIRATORY_TRACT | Status: DC | PRN
  Administered 2012-08-07: 2.5 mg via RESPIRATORY_TRACT
  Filled 2012-08-06: qty 0.5

## 2012-08-06 MED ORDER — HYDROMORPHONE HCL PF 1 MG/ML IJ SOLN: 0.5000 mg | INTRAMUSCULAR | Status: DC | PRN

## 2012-08-06 MED ORDER — IPRATROPIUM BROMIDE 0.02 % IN SOLN
0.5000 mg | RESPIRATORY_TRACT | Status: DC
  Administered 2012-08-06 – 2012-08-07 (×4): 0.5 mg via RESPIRATORY_TRACT
  Filled 2012-08-06 (×4): qty 2.5

## 2012-08-06 MED ORDER — ALBUTEROL SULFATE (5 MG/ML) 0.5% IN NEBU
2.5000 mg | INHALATION_SOLUTION | RESPIRATORY_TRACT | Status: DC
  Administered 2012-08-06 – 2012-08-07 (×4): 2.5 mg via RESPIRATORY_TRACT
  Filled 2012-08-06 (×4): qty 0.5

## 2012-08-06 MED ORDER — ROFLUMILAST 500 MCG PO TABS
500.0000 ug | ORAL_TABLET | Freq: Every day | ORAL | Status: DC
  Administered 2012-08-07: 500 ug via ORAL
  Filled 2012-08-06 (×3): qty 1

## 2012-08-06 NOTE — Progress Notes (Signed)
Patient is complaining of severe back and hip pain. Physician is aware. Offered patient ice pack and gave him a microwave meal to eat.

## 2012-08-06 NOTE — ED Notes (Addendum)
Pt c/o productive cough with yellow sputum and fever for the past 3 days.  C/O worsening SOB since yesterday.  Has been using nebs every  Hours but isn't helping today.  Says has coughed so much his back hurts. Pt says he became so weak yesterday that he fell and also c/o r hip pain.

## 2012-08-06 NOTE — ED Notes (Signed)
Pt states he has been taking nyquil for his fever but has not had any for about 8 hrs. EDP in to eval
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
## 2012-08-07 DIAGNOSIS — J441 Chronic obstructive pulmonary disease with (acute) exacerbation: Principal | ICD-10-CM

## 2012-08-07 LAB — COMPREHENSIVE METABOLIC PANEL
BUN: 16 mg/dL (ref 6–23)
CO2: 28 mEq/L (ref 19–32)
Calcium: 9.4 mg/dL (ref 8.4–10.5)
Chloride: 99 mEq/L (ref 96–112)
Creatinine, Ser: 0.77 mg/dL (ref 0.50–1.35)
GFR calc Af Amer: >90 mL/min (ref >90)
GFR calc non Af Amer: >90 mL/min (ref >90)
Total Bilirubin: 0.3 mg/dL (ref 0.3–1.2)

## 2012-08-07 LAB — CBC
HCT: 44.6 % (ref 39.0–52.0)
MCH: 31.3 pg (ref 26.0–34.0)
MCV: 92.5 fL (ref 78.0–100.0)
RBC: 4.82 MIL/uL (ref 4.22–5.81)
WBC: 14.7 10*3/uL — ABNORMAL HIGH (ref 4.0–10.5)

## 2012-08-07 LAB — URINALYSIS, ROUTINE W REFLEX MICROSCOPIC
Bilirubin Urine: NEGATIVE
Glucose, UA: NEGATIVE mg/dL
Hgb urine dipstick: NEGATIVE
Protein, ur: NEGATIVE mg/dL
Specific Gravity, Urine: 1.01 (ref 1.005–1.030)
Urobilinogen, UA: 0.2 mg/dL (ref 0.0–1.0)

## 2012-08-07 LAB — TSH: TSH: 0.925 u[IU]/mL (ref 0.350–4.500)

## 2012-08-07 LAB — HEMOGLOBIN A1C: Hgb A1c MFr Bld: 5.5 % (ref <5.7)

## 2012-08-07 MED ORDER — LEVOFLOXACIN 500 MG PO TABS: 500.0000 mg | ORAL_TABLET | Freq: Every day | ORAL | Status: DC

## 2012-08-07 MED ORDER — BIOTENE DRY MOUTH MT LIQD
15.0000 mL | Freq: Two times a day (BID) | OROMUCOSAL | Status: DC
  Administered 2012-08-07: 15 mL via OROMUCOSAL

## 2012-08-07 MED ORDER — NICOTINE 21 MG/24HR TD PT24
21.0000 mg | MEDICATED_PATCH | Freq: Every day | TRANSDERMAL | Status: DC
  Administered 2012-08-07: 21 mg via TRANSDERMAL
  Filled 2012-08-07 (×2): qty 1

## 2012-08-07 MED ORDER — PREDNISONE 20 MG PO TABS: ORAL_TABLET | ORAL | Status: DC

## 2012-08-07 NOTE — Progress Notes (Signed)
Inpatient Diabetes Program Recommendations  AACE/ADA: New Consensus Statement on Inpatient Glycemic Control (2013)  Target Ranges:  Prepandial:   less than 140 mg/dL      Peak postprandial:   less than 180 mg/dL (1-2 hours)      Critically ill patients:  140 - 180 mg/dL   Results for TEDDRICK, MALLARI (MRN [id number]) as of 08/07/2012 08:42  Ref. Range 08/06/2012 18:22 08/07/2012 05:16  Glucose Latest Range: 70-99 mg/dL 409 (H) 811 (H)    Inpatient Diabetes Program Recommendations Correction (SSI): Please consider ordering CBGs with Novolog correction ACHS.  Note: Patient does not have a history of diabetes.  Initial blood glucose was 107 mg/dl at 91:47 on 10/05/93.  Fasting blood glucose this morning was 186 mg/dl which is likely due to steroids.  Please consider ordering CBGs with Novolog correction ACHS while on steroids.  Will continue to follow.   Thanks, Orlando Penner, RN, MSN, CCRN Diabetes Coordinator Inpatient Diabetes Program [phone]

## 2012-08-07 NOTE — Plan of Care (Signed)
Problem: Phase I Progression Outcomes Goal: O2 sats > or equal 90% or at baseline Outcome: Completed/Met Date Met:  08/07/12 At baseline ptuses o1 at home Goal: Pt OOB to Walk or Exercise Daily With Nursing or PT Patient OOB to walk or exercise daily with nursing or PT if activity order permits  Outcome: Completed/Met Date Met:  08/07/12 Physical therapy ambulating pt  Problem: Discharge Progression Outcomes Goal: Activity appropriate for discharge plan Outcome: Completed/Met Date Met:  08/07/12 Ambulatory in room Goal: Complications resolved/controlled Outcome: Completed/Met Date Met:  08/07/12 controled Goal: Other Discharge Outcomes/Goals Outcome: Completed/Met Date Met:  08/07/12 Discharged to home via [facility] Pt was able to verbalized doses and indications of all drugs

## 2012-08-07 NOTE — Evaluation (Signed)
Occupational Therapy Evaluation Patient Details Name: Wesley [name] MRN: [id number] DOB: [birth year] Today's Date: 08/07/2012 Time: 7829-5621 OT Time Calculation (min): 30 min  OT Assessment / Plan / Recommendation Clinical Impression  Patient is a 47 y/o male s/p COPD exacerbation presenting to acute OT with educatino complete. Patient presents with SOB on excertion (walking) and increase pain in back and hip. Patient was education on compensatory breathing techniques when ambulating with PT. At this time, patient does not require OT services; will sign off.     OT Assessment  Patient does not need any further OT services    Follow Up Recommendations  No OT follow up       Equipment Recommendations  None recommended by OT          Precautions / Restrictions Precautions Precautions: Fall   Pertinent Vitals/Pain 10/10 pain level in lower back and right hip. Pt states that he has received what he can for pain but nothing helps.     ADL  Lower Body Dressing: Performed;Modified independent Where Assessed - Lower Body Dressing: Unsupported sitting Toilet Transfer: Performed;Minimal assistance Toilet Transfer Equipment:  (to recliner. ambulating) Transfers/Ambulation Related to ADLs: Patient transfers with a Min Assist with RW.       Visit Information  Last OT Received On: 08/07/12 Assistance Needed: +1 PT/OT Co-Evaluation/Treatment: Yes    Subjective Data  Subjective: S: I get to coughing so hard that I've gotten a catch in my back. It hurts so bad.  Patient Stated Goal: To use the bathroom.   Prior Functioning     Home Living Lives With: Spouse Available Help at Discharge: Family;Available 24 hours/day (wife recently had two back surgeries. ) Type of Home: Mobile home Home Access: Stairs to enter Entrance Stairs-Number of Steps: 3 Entrance Stairs-Rails: Right Home Layout: One level Bathroom Shower/Tub: Tub/shower unit Home Adaptive Equipment: Straight cane;Grab  bars in shower;Grab bars around toilet;Shower chair with back;Walker - rolling Additional Comments: Uses cabne in the community Prior Function Level of Independence: Independent with assistive device(s) Able to Take Stairs?: Yes Driving: Yes Vocation: Unemployed Communication Communication: No difficulties Dominant Hand: Right         Vision/Perception Vision - History Baseline Vision: No visual deficits Patient Visual Report: No change from baseline   Cognition  Cognition Arousal/Alertness: Awake/alert Behavior During Therapy: WFL for tasks assessed/performed Overall Cognitive Status: Within Functional Limits for tasks assessed    Extremity/Trunk Assessment Right Upper Extremity Assessment RUE ROM/Strength/Tone: WFL for tasks assessed Left Upper Extremity Assessment LUE ROM/Strength/Tone: WFL for tasks assessed     Mobility Bed Mobility Bed Mobility: Supine to Sit Supine to Sit: 7: Independent           End of Session OT - End of Session Equipment Utilized During Treatment: Gait belt Activity Tolerance: Patient tolerated treatment well;Patient limited by pain;Patient limited by fatigue Patient left: in chair;with call bell/phone within reach     [facility], OTR/L,CBIS   08/07/2012, 9:33 AM

## 2012-08-07 NOTE — Care Management Note (Addendum)
    Page 1 of 1   08/07/2012     12:27:06 PM   CARE MANAGEMENT NOTE 08/07/2012  Patient:  Wesley [name], Wesley [name]   Account Number:  [id number]  Date Initiated:  08/07/2012  Documentation initiated by:  Sharrie Rothman  Subjective/Objective Assessment:   Pt admitted from home with COPD exacerbation. Pt lives with his wife and will return home at discharge. Pt has home O2.     Action/Plan:   Pt discharged home today. [facility] number given to pt for transportation home. No other CM needs noted.   Anticipated DC Date:  08/07/2012   Anticipated DC Plan:  HOME/SELF CARE      DC Planning Services  CM consult      Choice offered to / List presented to:             Status of service:  Completed, signed off Medicare Important Message given?   (If response is "NO", the following Medicare IM given date fields will be blank) Date Medicare IM given:   Date Additional Medicare IM given:    Discharge Disposition:  HOME/SELF CARE  Per UR Regulation:    If discussed at Long Length of Stay Meetings, dates discussed:    Comments:  08/07/12 1220 Arlyss Queen, RN BSN CM Pt stated that he does not use portable O2 tanks for travel to MD appts because he could not afford to pay for them. CM called [employer], who provides pts O2, and they stated due to financial agreement with pt that he would have to pay for portable O2 tanks. Pt stated that he could not afford to pay. He also stated that he goes to all MD appts without portable O2 and that he is ok for about an hour without O2. Pt stated that he did not want CM to call [facility] again to inquire about portable O2.  08/07/12 1115 Arlyss Queen, RN BSN CM

## 2012-08-07 NOTE — Progress Notes (Signed)
INITIAL NUTRITION ASSESSMENT  DOCUMENTATION CODES Per approved criteria  -Not Applicable   INTERVENTION: Recommend pt comsume Ensure Complete po BID, each supplement provides 350 kcal and 13 grams of protein.  NUTRITION DIAGNOSIS: Inadequate oral intake related to shortness of breath as evidenced by severe COPD.   Goal: Pt to meet >/= 90% of their estimated nutrition needs  Monitor:  Po intake, labs and wt trends  Reason for Assessment: Malnutrition Screen= 3  47 y.o. male  Admitting Dx: COPD exacerbation  ASSESSMENT: Pt says he is being discharged today. Wt hx shows wt loss of 10# since last assessed by RD on 07/23/11. He is having difficulty eating due to shortness of breath. Suspect nutrition intake inadequate to meet estimated needs.  Height: Ht Readings from Last 1 Encounters:  08/06/12 6\' 1"  (1.854 m)    Weight: Wt Readings from Last 1 Encounters:  08/07/12 160 lb 8 oz (72.802 kg)    Ideal Body Weight: 184# (83.6 kg)  % Ideal Body Weight: 88%  Wt Readings from Last 10 Encounters:  08/07/12 160 lb 8 oz (72.802 kg)  06/14/12 160 lb (72.576 kg)  01/04/12 244 lb 4.3 oz (110.8 kg)  07/18/11 169 lb 12.1 oz (77 kg)    Usual Body Weight: 170# (May 2013)  % Usual Body Weight: 94%  BMI:  Body mass index is 21.18 kg/(m^2). Normal range  Estimated Nutritional Needs: Kcal: 1825-2190  Protein: 110 gr Fluid: >2200 ml/day  Skin: No issues noted  Diet Order: Cardiac  EDUCATION NEEDS: -No education needs identified at this time   Intake/Output Summary (Last 24 hours) at 08/07/12 1045 Last data filed at 08/07/12 0847  Gross per 24 hour  Intake    200 ml  Output      0 ml  Net    200 ml    Last BM: 08/07/12  Labs:   Recent Labs Lab 08/06/12 1822 08/07/12 0516  NA 139 136  K 4.1 4.1  CL 101 99  CO2 29 28  BUN 15 16  CREATININE 0.96 0.77  CALCIUM 9.4 9.4  GLUCOSE 107* 186*    CBG (last 3)  No results found for this basename: GLUCAP,  in the  last 72 hours  Scheduled Meds: . albuterol  2.5 mg Nebulization Q4H WA  . antiseptic oral rinse  15 mL Mouth Rinse BID  . enoxaparin (LOVENOX) injection  40 mg Subcutaneous Q24H  . ipratropium  0.5 mg Nebulization Q4H WA  . levofloxacin  500 mg Oral Daily  . loratadine  10 mg Oral Daily  . meloxicam  15 mg Oral Daily  . methylPREDNISolone (SOLU-MEDROL) injection  125 mg Intravenous Q6H  . mometasone-formoterol  2 puff Inhalation BID  . nicotine  21 mg Transdermal Daily  . pantoprazole  40 mg Oral BID  . polyvinyl alcohol  1-2 drop Both Eyes Daily  . roflumilast  500 mcg Oral Daily    Continuous Infusions:   Past Medical History  Diagnosis Date  . COPD (chronic obstructive pulmonary disease)   . Osteoporosis   . Neuropathy     Past Surgical History  Procedure Laterality Date  . Joint replacement    . Back surgery      Royann Shivers MS,RD,LDN,CSG Office: #[phone] Pager: [phone]

## 2012-08-07 NOTE — Evaluation (Signed)
Physical Therapy Evaluation Patient Details Name: Wesley [name] MRN: [id number] DOB: 1966/02/10 Today's Date: 08/07/2012 Time: 7829-5621 PT Time Calculation (min): 35 min  PT Assessment / Plan / Recommendation Clinical Impression  Pt with decreased I in mobility secondary to pain.  Pt will benefit from PT to address improving safety of ambulation as well as stair climbing    PT Assessment  Patient needs continued PT services    Follow Up Recommendations  Outpatient PT    Does the patient have the potential to tolerate intense rehabilitation    n/a  Barriers to Discharge  none      Equipment Recommendations    none   Recommendations for Other Services  none   Frequency Min 5X/week    Precautions / Restrictions Precautions Precautions: Fall Restrictions Weight Bearing Restrictions: No   Pertinent Vitals/Pain 8/10      Mobility  Bed Mobility Bed Mobility: Supine to Sit Supine to Sit: 6: Modified independent (Device/Increase time) Transfers Transfers: Sit to Stand Sit to Stand: 6: Modified independent (Device/Increase time) Ambulation/Gait Ambulation/Gait Assistance: 4: Min guard Ambulation Distance (Feet): 100 Feet (Pt needed to rest for approximately one minute at 50 ft.) Assistive device: Rolling walker Gait Pattern: Left flexed knee in stance Gait velocity: slow General Gait Details: pt appears to have increased pain while walking; tends to breathe through his mouth rather than his nose counseled about the benefit of breathing through the nose vs. mouth breathing Stairs: No    Exercises Other Exercises Other Exercises: instructed in abdominal sets as well as glut sets although pt does not seem to be interested in the material   PT Diagnosis: Difficulty walking  PT Problem List: Pain PT Treatment Interventions: DME instruction;Gait training;Stair training;Functional mobility training;Therapeutic exercise   PT Goals Acute Rehab PT Goals PT Goal  Formulation: With patient Time For Goal Achievement: 08/09/12 Potential to Achieve Goals: Good Pt will go Supine/Side to Sit: with modified independence PT Goal: Supine/Side to Sit - Progress: Goal set today Pt will go Sit to Stand: with modified independence PT Goal: Sit to Stand - Progress: Goal set today Pt will Ambulate: >150 feet;with modified independence PT Goal: Ambulate - Progress: Goal set today Pt will Go Up / Down Stairs: 3-5 stairs;with modified independence PT Goal: Up/Down Stairs - Progress: Goal set today  Visit Information  Last PT Received On: 08/07/12 Assistance Needed: +1 PT/OT Co-Evaluation/Treatment: Yes    Subjective Data  Subjective: Pt states he normally uses a cane at home prior to his fall;   Patient Stated Goal: less pain   Prior Functioning  Home Living Lives With: Spouse Available Help at Discharge: Family;Available 24 hours/day Type of Home: Mobile home Home Access: Stairs to enter Entrance Stairs-Number of Steps: 3 Entrance Stairs-Rails: Right Home Layout: One level Bathroom Shower/Tub: Tub/shower unit Home Adaptive Equipment: Straight cane;Grab bars in shower;Grab bars around toilet;Shower chair with back;Walker - rolling Additional Comments: Uses cabne in the community Prior Function Level of Independence: Independent with assistive device(s) Able to Take Stairs?: Yes Driving: Yes Vocation: Unemployed Communication Communication: No difficulties Dominant Hand: Right    Cognition  Cognition Arousal/Alertness: Awake/alert Behavior During Therapy: WFL for tasks assessed/performed Overall Cognitive Status: Within Functional Limits for tasks assessed    Extremity/Trunk Assessment Right Upper Extremity Assessment RUE ROM/Strength/Tone: [facility] for tasks assessed Left Upper Extremity Assessment LUE ROM/Strength/Tone: WFL for tasks assessed Right Lower Extremity Assessment RLE ROM/Strength/Tone: [facility] for tasks assessed Left Lower Extremity  Assessment LLE ROM/Strength/Tone: [facility] for tasks  assessed Trunk Assessment Trunk Assessment: Other exceptions Trunk Exceptions: Pt is forward bent approximately 35 degrees while standing and walking due to back pain.  Pt encouraged to straighten back up but pt states he is unable to secondary to the pain.     Balance    End of Session PT - End of Session Equipment Utilized During Treatment: Gait belt Activity Tolerance: Patient limited by pain Patient left: in chair;with call bell/phone within reach  GP     RUSSELL,CINDY 08/07/2012, 9:45 AM
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
## 2012-08-07 NOTE — Progress Notes (Signed)
UR chart review completed.  

## 2012-11-01 ENCOUNTER — Other Ambulatory Visit (HOSPITAL_COMMUNITY): Payer: Self-pay | Admitting: Internal Medicine

## 2012-11-01 DIAGNOSIS — J441 Chronic obstructive pulmonary disease with (acute) exacerbation: Secondary | ICD-10-CM

## 2012-11-06 ENCOUNTER — Ambulatory Visit (HOSPITAL_COMMUNITY)
Admission: RE | Admit: 2012-11-06 | Discharge: 2012-11-06 | Disposition: A | Discharge status: Home / Self Care | Payer: Medicaid Other | Source: Ambulatory Visit | Attending: Internal Medicine | Admitting: Internal Medicine

## 2012-11-06 ENCOUNTER — Encounter (HOSPITAL_COMMUNITY): Payer: Medicaid Other

## 2012-11-06 DIAGNOSIS — R0989 Other specified symptoms and signs involving the circulatory and respiratory systems: Secondary | ICD-10-CM | POA: Insufficient documentation

## 2012-11-06 DIAGNOSIS — J449 Chronic obstructive pulmonary disease, unspecified: Secondary | ICD-10-CM | POA: Insufficient documentation

## 2012-11-06 DIAGNOSIS — R0609 Other forms of dyspnea: Secondary | ICD-10-CM | POA: Insufficient documentation

## 2012-11-06 DIAGNOSIS — J4489 Other specified chronic obstructive pulmonary disease: Secondary | ICD-10-CM | POA: Insufficient documentation

## 2012-11-06 LAB — BLOOD GAS, ARTERIAL
Acid-Base Excess: 1.4 mmol/L (ref 0.0–2.0)
Bicarbonate: 25.5 mEq/L — ABNORMAL HIGH (ref 20.0–24.0)
O2 Saturation: 95.9 %
TCO2: 21.6 mmol/L (ref 0–100)
pO2, Arterial: 76.4 mmHg — ABNORMAL LOW (ref 80.0–100.0)

## 2012-11-06 MED ORDER — ALBUTEROL SULFATE (5 MG/ML) 0.5% IN NEBU
2.5000 mg | INHALATION_SOLUTION | Freq: Once | RESPIRATORY_TRACT | Status: AC
  Administered 2012-11-06: 2.5 mg via RESPIRATORY_TRACT

## 2012-11-18 NOTE — Procedures (Signed)
NAMEDEPAUL, ARIZPE NO.:  [id number]  MEDICAL RECORD NO.:  [id number]  LOCATION:                            FACILITY:  [facility]  PHYSICIAN:  Taziyah Iannuzzi L. [name], M.D.DATE OF BIRTH:  [birth year]  DATE OF PROCEDURE: DATE OF DISCHARGE:                           PULMONARY FUNCTION TEST   REASON FOR PULMONARY FUNCTION TESTING:  COPD exacerbation.  1. Spirometry shows a severe ventilatory defect with evidence of     airflow obstruction. 2. Lung volumes show marked air trapping. 3. DLCO is severely reduced. 4. Airway resistance is high, confirming the presence of airflow     obstruction. 5. Arterial blood gas shows mild resting, relative hypoxia. 6. There is significant bronchodilator improvement. 7. This study is consistent with the clinical diagnosis of chronic     obstructive pulmonary disease.     Malaysha Arlen L. [name], M.D.     ELH/MEDQ  D:  11/14/2012  T:  11/15/2012  Job:  [id number]  cc:   Lia Hopping, MD Fax: [phone]

## 2012-11-20 LAB — PULMONARY FUNCTION TEST

## 2012-11-27 ENCOUNTER — Encounter (HOSPITAL_COMMUNITY): Payer: Self-pay

## 2012-11-27 ENCOUNTER — Encounter (HOSPITAL_COMMUNITY)
Admission: RE | Admit: 2012-11-27 | Discharge: 2012-11-27 | Disposition: A | Discharge status: Home / Self Care | Payer: Medicaid Other | Source: Ambulatory Visit | Attending: Internal Medicine | Admitting: Internal Medicine

## 2012-11-27 VITALS — BP 90/62 | HR 86 | Ht 72.0 in | Wt 157.0 lb

## 2012-11-27 DIAGNOSIS — J449 Chronic obstructive pulmonary disease, unspecified: Secondary | ICD-10-CM

## 2012-11-27 DIAGNOSIS — J4489 Other specified chronic obstructive pulmonary disease: Secondary | ICD-10-CM | POA: Insufficient documentation

## 2012-11-27 DIAGNOSIS — Z5189 Encounter for other specified aftercare: Secondary | ICD-10-CM | POA: Insufficient documentation

## 2012-11-27 NOTE — Patient Instructions (Signed)
Pt has finished orientation and is scheduled to start PR on 12/02/12 at 1 pm. Pt has been instructed to arrive to class 15 minutes early for scheduled class. Pt has been instructed to wear comfortable clothing and shoes with rubber soles. Pt has been told to take their medications 1 hour prior to coming to class.  If the patient is not going to attend class, he/she has been instructed to call.   

## 2012-11-27 NOTE — Progress Notes (Signed)
Patient referred to [facility] due to COPD 496. During orientation advised patient on arrival and appointment times what to wear, what to do before, during and after exercise. Reviewed attendance and class policy. Talked about inclement weather and class consultation policy. Pt is scheduled to start Pulm Rehab on 12/02/12 at 1 pm. Pt was advised to come to class 5 minutes before class starts. He was also given instructions on meeting with the dietician and attending the Family Structure classes. Pt is eager to get started. Patient unable to do 6 minute walk test due to severe SOB, and having a cane and hip replacement.

## 2012-11-28 NOTE — Progress Notes (Signed)
Wesley [name] 47 y.o. male  Initial Psychosocial Assessment  Pt psychosocial assessment reveals pt lives with their spouse. Pt is currently unemployed, disabled. Pt hobbies include fishing and camping. Pt reports 7 as his stress level is high. Areas of stress/anxiety include Health.  Pt does exhibits signs of depression. Signs of depression include sadness and difficulty maintaining sleep. Pt shows fair  coping skills with negative outlook . Family offered emotional support and reassurance. Monitor and evaluate progress toward psychosocial goal(s).  Goal(s): Exercise can offer Improved management of his stress,and anxiety of having lung surgery. Exercise can offer Improved coping skills Exercise can help patient work toward returning to meaningful activities that improve patient's QOL and are attainable with patient's lung disease. Exercise can make him stronger for his upcoming surgery.   11/28/2012 3:31 PM

## 2012-12-02 ENCOUNTER — Encounter (HOSPITAL_COMMUNITY)
Admission: RE | Admit: 2012-12-02 | Discharge: 2012-12-02 | Disposition: A | Discharge status: Home / Self Care | Payer: Medicaid Other | Source: Ambulatory Visit | Attending: Internal Medicine | Admitting: Internal Medicine

## 2012-12-04 ENCOUNTER — Encounter (HOSPITAL_COMMUNITY)
Admission: RE | Admit: 2012-12-04 | Discharge: 2012-12-04 | Disposition: A | Discharge status: Home / Self Care | Payer: Medicaid Other | Source: Ambulatory Visit | Attending: Internal Medicine | Admitting: Internal Medicine

## 2012-12-04 DIAGNOSIS — J4489 Other specified chronic obstructive pulmonary disease: Secondary | ICD-10-CM | POA: Insufficient documentation

## 2012-12-04 DIAGNOSIS — Z5189 Encounter for other specified aftercare: Secondary | ICD-10-CM | POA: Insufficient documentation

## 2012-12-04 DIAGNOSIS — J449 Chronic obstructive pulmonary disease, unspecified: Secondary | ICD-10-CM | POA: Insufficient documentation

## 2012-12-09 ENCOUNTER — Encounter (HOSPITAL_COMMUNITY): Payer: Medicaid Other

## 2012-12-11 ENCOUNTER — Encounter (HOSPITAL_COMMUNITY): Payer: Medicaid Other

## 2012-12-15 NOTE — Progress Notes (Signed)
Wesley [name] 47 y.o. male  Patient 30 day Psychosocial Note  Patient psychosocial assessment reveals that their are barriers to participation in Pulmonary Rehab. Psychosocial areas that are currently affecting patient's rehab experience include concerns about his financial resources, transportation,and emotional problems as evidenced by depression, worry. Patient does not continue to exhibit positive coping skills to deal with his psychosocial concerns. Offered emotional support and reassurance. Patient does feel he making progress toward Pulmonary Rehab goals. Patient has only been to class twice since starting on 12/02/12. Called to inquire about why patient has not been in class and his phone services where disconnected. So patient has not been able to report about his health and activity level being improved in the past 30 days as evidenced by patient's being absent.  We have not really noticed any real changes in his activity and/or mood. Patient is going to have to have lung surgery once finished with Pulmonary rehab and he has expressed being very anxious about this. After reviewing the patient's treatment plan, the patient is not making progress toward Pulmonary Rehab goals due to his absence. Patient's rate of progress toward rehab goals is poor due to his absence. Plan of action to help patient continue to work towards rehab goals include is to continue to try to get in touch with him and encourage him to return to the program. Will continue to monitor and evaluate progress toward psychosocial goal(s).  Goal(s) in progress: Getting him coming to exercise more to Improved management of his stress,and to ease his anxiety surrounding his upcoming surgery.  Get him to return to program to help patient work toward returning to meaningful activities that improve patient's QOL and are attainable with patient's lung disease

## 2012-12-15 NOTE — Addendum Note (Signed)
Encounter addended by: Rolene Course on: 12/15/2012  3:53 PM<BR>     Documentation filed: Notes Section

## 2012-12-16 ENCOUNTER — Encounter (HOSPITAL_COMMUNITY)
Admission: RE | Admit: 2012-12-16 | Discharge: 2012-12-16 | Disposition: A | Discharge status: Home / Self Care | Payer: Medicaid Other | Source: Ambulatory Visit | Attending: Internal Medicine | Admitting: Internal Medicine

## 2012-12-18 ENCOUNTER — Encounter (HOSPITAL_COMMUNITY): Payer: Medicaid Other

## 2012-12-19 NOTE — Addendum Note (Signed)
Encounter addended by: Rolene Course on: 12/19/2012  8:13 AM<BR>     Documentation filed: Visit Diagnoses, Notes Section

## 2012-12-19 NOTE — Addendum Note (Signed)
Encounter addended by: Rolene Course on: 12/19/2012  8:20 AM<BR>     Documentation filed: Notes Section

## 2012-12-19 NOTE — Progress Notes (Addendum)
Pulmonary Rehab/Respiratory Services Outcomes Entrance    Anthropometrics:  Entrance:  Height: 72 inches  Weight (kg): 71.2kg  Grip Strength: 145   Functional Status/Exercise Capacity:   Wesley [name] had a resting HR of 86 BPM, a resting BP of 90/62, and an oxygen saturation of 98% on no O2. Wesley [name] could not perform a 6-minute walk test on 11/27/12 due to hip pain and having a cane.    Dyspnea Measures:   The [facility] is a simple and standardized method of classifying disability in patients with COPD. The assessment correlates disability and dyspnea. At entrance the patient scored a 4.  The patient completed the [employer] Shortness of Breath Questionnaire ([facility] [place]). This questionnaire  relates activities of daily living and shortness of breath. The score ranges from 0-120, a higher score relates to severe shortness of breath during activities of daily living. The patient's score at entrance was 101.   Quality of Life:   Ferrans and Powers Quality of Life Index Pulmonary version is used to assess the patient satisfaction in different domains of their life; health and functioning, socioeconomic, psychological/spiritual, and family. The overall score is recorded out of 30 points. The patient's goal is to achieve an overall score of 21 or higher. The patient received a score of 20.85 at entrance.  The Patient Health Questionnaire (PHQ-9) assesses the degree of depression. Depression is important to monitor and track in pulmonary patients due to its prevalence in the population. The goal for a patient is to score less than 4 on this assessment. Wesley [name] scored 7 at entrance. Talked to patient about seeking counseling and if he wanted me to contact his PCP to refer him. Patient refused.   Clinical Assessment Tools:   The COPD Assessment Test (CAT) is a measurement tool to quantify how much of an impact the disease has on the patient's life. The assessment  aids the Pulmonary Rehab Team in designing the patients individualized treatment plan. A CAT score ranges from 0-40. A score of 10 or below indicates that COPD has a low impact on the patient's life whereas a score of 30 or higher indicates a severe impact. The patient's goal is a decrease of 1 point from entrance to discharge. Wesley [name] had a CAT score of ____ at entrance.   Nutrition:   The "Rate My Plate" is a dietary assessment that quantifies the balance of a patient's diet. The tool allows the Pulmonary Rehab Team to key in on the areas of the patient's diet that needs improving. The team can then focus their nutritional education on those areas. If the patient scores 23-38, this means there are many ways they can make their eating habits healthier, 39-54 states there are some ways they can make their eating habits healthier and a score of 55-69 states that they are making many healthy choices. Wesley [name] has achieved a score of ___/14 at entrance.   Education:   Wesley [name] has only come to 3 Pulmonary sessions at this time and in this time period has attended 1 class. The classes that will be offered to the patient before graduation are How Lungs Work and Disease, Exercise, Environmental Irritants, Meds/Inhalers and Oxygen, Energy Saving Techniques/Energy Conservation, Bronchial Hygiene/Pursed Lip Breathing, Cleaning Equipment, Nutrition I Fats, Nutrition II Labels, Respiratory Infections, Stress I & II, Oxygen for Home and Travel. The patient completed an education assessment at the entrance of the program and will at discharge. This assessment included  14 questions regarding all of the education topics above. Wesley [name] achieved a score of 6/14 at entrance.   Exercise:   Wesley [name] was provided with a Home Exercise Prescription (HEP) at the entrance of the program. The patient was followed by the Exercise Physiologist throughout the program to assist with progressing the frequency,  intensity, time and type of exercise. At entrance of the program the patient was exercise 0 # of days at home. The goal is for the patient to exercise at home on days not coming to the program. At the point of discharge this will be assessed to measure any improvement.      Dr.  Bing: ________________________   Date: ________________ Medical Director

## 2012-12-23 ENCOUNTER — Encounter (HOSPITAL_COMMUNITY): Payer: Medicaid Other

## 2012-12-25 ENCOUNTER — Encounter (HOSPITAL_COMMUNITY): Payer: Medicaid Other

## 2012-12-30 ENCOUNTER — Encounter (HOSPITAL_COMMUNITY)
Admission: RE | Admit: 2012-12-30 | Discharge: 2012-12-30 | Disposition: A | Discharge status: Home / Self Care | Payer: Medicaid Other | Source: Ambulatory Visit | Attending: Internal Medicine | Admitting: Internal Medicine

## 2013-01-10 NOTE — Progress Notes (Addendum)
Wesley [name] 47 y.o. male  Patient 60 day Psychosocial Note  Patient psychosocial assessment reveals there are barriers to participation in Pulmonary Rehab. Psychosocial areas that are currently affecting patient's rehab experience include concerns about financial resources, and that he getting ready to have Lung surgery. Patient does not continue to exhibit negative coping skills to deal with his psychosocial concerns. Offered emotional support and reassurance. Patient does not feel he is making progress toward Pulmonary Rehab goals. This is mainly due to the patients poor participation. The patient has only been to 3 sessions since starting on 12/02/12. Patient reports his health and activity level has not improved in the past 30 days as evidenced by patient's report of unchanged ability to breath better. Patient states family/friends have not noticed changes in his activity or mood. Patient reports he is not feeling positive about current and projected progression in Pulmonary Rehab. After reviewing the patient's treatment plan, the patient is not making progress toward Pulmonary Rehab goals. Patient's rate of progress toward rehab goals is inadequate. Plan of action to help patient continue to work towards rehab goals include waiting to see if he plans to return after his lung surgery. Will continue to monitor and evaluate progress toward psychosocial goal(s).  Goal(s) in progress: Get patient to come on a consistent basis.  Get him to return to program to help patient work toward returning to meaningful activities that improve patient's QOL and are attainable with patient's lung disease.      Dr. Prentice [name]: ________________________ Date: ________ Time: ______ Medical Director

## 2013-02-04 ENCOUNTER — Emergency Department (HOSPITAL_COMMUNITY): Payer: Medicaid Other

## 2013-02-04 ENCOUNTER — Encounter (HOSPITAL_COMMUNITY): Payer: Self-pay | Admitting: Emergency Medicine

## 2013-02-04 ENCOUNTER — Emergency Department (HOSPITAL_COMMUNITY)
Admission: EM | Admit: 2013-02-04 | Discharge: 2013-02-04 | Disposition: A | Discharge status: Home / Self Care | Payer: Medicaid Other | Attending: Emergency Medicine | Admitting: Emergency Medicine

## 2013-02-04 DIAGNOSIS — R071 Chest pain on breathing: Secondary | ICD-10-CM | POA: Insufficient documentation

## 2013-02-04 DIAGNOSIS — Z792 Long term (current) use of antibiotics: Secondary | ICD-10-CM | POA: Insufficient documentation

## 2013-02-04 DIAGNOSIS — Z79899 Other long term (current) drug therapy: Secondary | ICD-10-CM | POA: Insufficient documentation

## 2013-02-04 DIAGNOSIS — G8918 Other acute postprocedural pain: Secondary | ICD-10-CM | POA: Insufficient documentation

## 2013-02-04 DIAGNOSIS — IMO0002 Reserved for concepts with insufficient information to code with codable children: Secondary | ICD-10-CM | POA: Insufficient documentation

## 2013-02-04 DIAGNOSIS — Z87891 Personal history of nicotine dependence: Secondary | ICD-10-CM | POA: Insufficient documentation

## 2013-02-04 DIAGNOSIS — J4489 Other specified chronic obstructive pulmonary disease: Secondary | ICD-10-CM | POA: Insufficient documentation

## 2013-02-04 DIAGNOSIS — J449 Chronic obstructive pulmonary disease, unspecified: Secondary | ICD-10-CM | POA: Insufficient documentation

## 2013-02-04 DIAGNOSIS — Z8739 Personal history of other diseases of the musculoskeletal system and connective tissue: Secondary | ICD-10-CM | POA: Insufficient documentation

## 2013-02-04 DIAGNOSIS — Z8669 Personal history of other diseases of the nervous system and sense organs: Secondary | ICD-10-CM | POA: Insufficient documentation

## 2013-02-04 DIAGNOSIS — Z9889 Other specified postprocedural states: Secondary | ICD-10-CM | POA: Insufficient documentation

## 2013-02-04 DIAGNOSIS — R0789 Other chest pain: Secondary | ICD-10-CM

## 2013-02-04 DIAGNOSIS — R0602 Shortness of breath: Secondary | ICD-10-CM | POA: Insufficient documentation

## 2013-02-04 LAB — BASIC METABOLIC PANEL
BUN: 14 mg/dL (ref 6–23)
CO2: 29 mEq/L (ref 19–32)
Chloride: 96 mEq/L (ref 96–112)
GFR calc Af Amer: >90 mL/min (ref >90)
Potassium: 4 mEq/L (ref 3.5–5.1)

## 2013-02-04 LAB — CBC WITH DIFFERENTIAL/PLATELET
Basophils Relative: 0 % (ref 0–1)
Eosinophils Absolute: 3.6 10*3/uL — ABNORMAL HIGH (ref 0.0–0.7)
HCT: 41.2 % (ref 39.0–52.0)
Hemoglobin: 14 g/dL (ref 13.0–17.0)
Lymphocytes Relative: 16 % (ref 12–46)
Lymphs Abs: 1.9 10*3/uL (ref 0.7–4.0)
MCHC: 34 g/dL (ref 30.0–36.0)
MCV: 92.6 fL (ref 78.0–100.0)
Monocytes Relative: 7 % (ref 3–12)
Neutro Abs: 5.4 10*3/uL (ref 1.7–7.7)

## 2013-02-04 LAB — TROPONIN I: Troponin I: <0.3 ng/mL (ref <0.30)

## 2013-02-04 MED ORDER — ONDANSETRON HCL 4 MG/2ML IJ SOLN
4.0000 mg | Freq: Once | INTRAMUSCULAR | Status: AC
  Administered 2013-02-04: 4 mg via INTRAVENOUS
  Filled 2013-02-04: qty 2

## 2013-02-04 MED ORDER — PROMETHAZINE HCL 25 MG PO TABS: 25.0000 mg | ORAL_TABLET | Freq: Four times a day (QID) | ORAL | Status: DC | PRN

## 2013-02-04 MED ORDER — HYDROMORPHONE HCL PF 1 MG/ML IJ SOLN
1.0000 mg | Freq: Once | INTRAMUSCULAR | Status: AC
  Administered 2013-02-04: 1 mg via INTRAVENOUS
  Filled 2013-02-04: qty 1

## 2013-02-04 MED ORDER — OXYCODONE-ACETAMINOPHEN 10-325 MG PO TABS: 1.0000 | ORAL_TABLET | ORAL | Status: DC | PRN

## 2013-02-04 NOTE — ED Notes (Signed)
Pt reports being released from [facility] on Friday. Has chest tube in place to right lung. And is afraid that he may be getting pneumonia, stated he has been having more trouble breathing that started today , no fever or chills, dry cough that started today.

## 2013-02-04 NOTE — ED Notes (Signed)
Pt's dressing on left air tube removed by EDP.  Slight redness noted to area above insertion site. Sutures appear intact. Dried, yellow drainage noted around tube.

## 2013-02-04 NOTE — ED Provider Notes (Addendum)
CSN: [id number]     Arrival date & time 02/04/13  1550 History  This chart was scribed for Donnetta Hutching, MD by Bennett Scrape, ED Scribe. This patient was seen in room APA08/APA08 and the patient's care was started at 5:54 PM.   Chief Complaint  Patient presents with  . post surgical pain     The history is provided by the patient. No language interpreter was used.    HPI Comments: Wesley [name] is a 47 y.o. male with a h/o recent surgery who presents to the Emergency Department complaining of gradual onset, gradually worsening, constant right lateral CP with associated swelling, redness and SOB for the past 2 days. Pt reports that he had a right partial lobectomy for "dead tissue" attributed to COPD at [facility] on Nov. 13th. He denies any CA diagnoses and that the surgery was for CA. Pt states that he had an temporary Heimlich valve "air tube" placed prior to discharge 4 days ago with a f/u scheduled in 3 days. He reports that he had chest tubes placed as well but states that those were removed prior to discharge and the sites have been healing well. Pt states that he is here today for redness, increased pain and swelling that he noticed yesterday that has gradually worsened since the onset. He reports that his wife has been changing his dressing twice daily since discharge and was also concerned. He has oxycodone at home for pain but states that they have not been helping over the past 2 days. He denies any smoking.   Surgeon was Alphonzo Dublin    Past Medical History  Diagnosis Date  . COPD (chronic obstructive pulmonary disease)   . Osteoporosis   . Neuropathy    Past Surgical History  Procedure Laterality Date  . Joint replacement    . Back surgery    . Lung surgery     Family History  Problem Relation Age of Onset  . Heart failure Father   . Diabetes Father   . Diabetes Mother   . Cancer Mother     male cancer   History  Substance Use Topics  . Smoking status: Former  Smoker -- 1.00 packs/day    Quit date: 07/05/2011  . Smokeless tobacco: Not on file  . 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    A complete 10 system review of systems was obtained and all systems are negative except as noted in the HPI and PMH.   Allergies  Ambien  Home Medications   Current Outpatient Rx  Name  Route  Sig  Dispense  Refill  . albuterol (PROVENTIL HFA;VENTOLIN HFA) 108 (90 BASE) MCG/ACT inhaler   Inhalation   Inhale 2 puffs into the lungs every 4 (four) hours as needed for wheezing or shortness of breath. Shortness of breath   1 Inhaler   0   . albuterol (PROVENTIL) (2.5 MG/3ML) 0.083% nebulizer solution   Nebulization   Take 2.5 mg by nebulization every 6 (six) hours as needed for wheezing or shortness of breath.          Marland Kitchen amoxicillin-clavulanate (AUGMENTIN) 875-125 MG per tablet   Oral   Take 1 tablet by mouth 2 (two) times daily.         . budesonide-formoterol (SYMBICORT) 160-4.5 MCG/ACT inhaler   Inhalation   Inhale 3 puffs into the lungs 4 (four) times daily.         Marland Kitchen  Fluticasone-Salmeterol (ADVAIR) 250-50 MCG/DOSE AEPB   Inhalation   Inhale 1 puff into the lungs every 12 (twelve) hours.         . furosemide (LASIX) 40 MG tablet   Oral   Take 40 mg by mouth daily.         Marland Kitchen gabapentin (NEURONTIN) 300 MG capsule   Oral   Take 300 mg by mouth every 8 (eight) hours.         . nicotine (NICODERM CQ - DOSED IN MG/24 HOURS) 14 mg/24hr patch   Transdermal   Place 14 mg onto the skin daily.         . Oxycodone HCl 10 MG TABS   Oral   Take 10-20 mg by mouth every 3 (three) hours as needed (for severe pain).         Bertram Gala Glycol-Propyl Glycol (SYSTANE) 0.4-0.3 % SOLN   Left Eye   Place 1-2 drops into the left eye daily as needed (for dry eye relief).          . potassium chloride SA (K-DUR,KLOR-CON) 20 MEQ tablet   Oral   Take 20 mEq by mouth daily.          Marland Kitchen senna (SENOKOT) 8.6 MG tablet   Oral   Take 2 tablets by mouth 2 (two) times daily.         Marland Kitchen tiotropium (SPIRIVA) 18 MCG inhalation capsule   Inhalation   Place 1 capsule (18 mcg total) into inhaler and inhale daily.   30 capsule   0    Triage Vitals: BP 120/56  Pulse 87  Temp(Src) 98.1 F (36.7 C) (Oral)  Resp 20  Ht 6\' 1"  (1.854 m)  Wt 158 lb 1 oz (71.697 kg)  BMI 20.86 kg/m2  SpO2 98%  Physical Exam  Nursing note and vitals reviewed. Constitutional: He is oriented to person, place, and time. He appears well-developed and well-nourished.  HENT:  Head: Normocephalic and atraumatic.  Eyes: Conjunctivae and EOM are normal. Pupils are equal, round, and reactive to light.  Neck: Normal range of motion. Neck supple.  Cardiovascular: Normal rate, regular rhythm and normal heart sounds.   Pulmonary/Chest: Effort normal and breath sounds normal.  Chest 2 protruding from right chest wall.  No pus or cellulitis  surrounding wound  Abdominal: Soft. Bowel sounds are normal.  Musculoskeletal: Normal range of motion.  Neurological: He is alert and oriented to person, place, and time.  Skin: Skin is warm and dry.  Psychiatric: He has a normal mood and affect. His behavior is normal.    ED Course  Procedures (including critical care time)  Medications  HYDROmorphone (DILAUDID) injection 1 mg (1 mg Intravenous Given 02/04/13 1823)  ondansetron (ZOFRAN) injection 4 mg (4 mg Intravenous Given 02/04/13 1823)    DIAGNOSTIC STUDIES: Oxygen Saturation is 98% on room air, normal by my interpretation.    COORDINATION OF CARE: 6:13 PM-Discussed treatment plan which includes medications, CXR, CBC panel, BMP and troponin with pt at bedside and pt agreed to plan.   Labs Review Labs Reviewed  BASIC METABOLIC PANEL - Abnormal; Notable for the following:    Sodium 134 (*)    All other components within normal limits  CBC WITH DIFFERENTIAL - Abnormal; Notable for the following:     WBC 11.7 (*)    Platelets 475 (*)    Eosinophils Relative 31 (*)    Eosinophils Absolute 3.6 (*)    All other components within normal  limits  TROPONIN I   Imaging Review Dg Chest 2 View  02/04/2013   CLINICAL DATA:  Right-sided chest tube  EXAM: CHEST  2 VIEW  COMPARISON:  August 06, 2012  FINDINGS: A right-sided chest tube is identified with distal tip in the right apex. There is a right pneumothorax with pleural line between the 5th and 6th posterior rib. There is a 2.7 x 2.7 cm mass in the right lung base. There is marked emphysema of left lung. There is mild atelectasis of the left mid to lung base. Subcutaneous emphysema of are noted bilaterally. The mediastinal contour and cardiac silhouette are normal.  IMPRESSION: A right-sided chest tube is identified with distal tip in the right apex. There is a right pneumothorax with pleural line between the 5th and 6th posterior rib.   Electronically Signed   By: Sherian Rein M.D.   On: 02/04/2013 18:53    EKG Interpretation    Date/Time:  Tuesday February 04 2013 19:31:32 EST Ventricular Rate:  85 PR Interval:  164 QRS Duration: 94 QT Interval:  354 QTC Calculation: 421 R Axis:   -44 Text Interpretation:  Normal sinus rhythm with sinus arrhythmia Left axis deviation Abnormal ECG When compared with ECG of 06-Aug-2012 18:04, Borderline criteria for Anterior infarct are no longer Present Confirmed by Tavon Corriher  MD, Uzair Godley ([id number]) on 02/04/2013 8:03:22 PM            MDM  No diagnosis found. Discussed case c thoracic surgeon Dr Alphonzo Dublin at [facility].    He knows the patient well. He does not believe patient needs to be admitted today. He will followup in his clinic this week. Patient is comfortable with this decision.  No new chest tube necessary.  I personally performed the services described in this documentation, which was scribed in my presence. The recorded information has been reviewed and is accurate.    Donnetta Hutching, MD 02/17/13  1544  Donnetta Hutching, MD 02/18/13 [phone]

## 2013-02-04 NOTE — ED Notes (Signed)
Discharged from [facility] from surgery to right lung with "air tube" inserted x 4 days ago.  Reports worsening sob and pain to right side x 2 days.  Denies fever, c/o productive cough.

## 2013-02-04 NOTE — ED Notes (Signed)
New dressing applied to air tube.

## 2013-03-25 NOTE — Progress Notes (Signed)
Pulmonary Rehabilitation Program Outcomes Report   Orientation:  11/27/2012 Graduate Date:  NA Discharge Date:  12/16/2012 # of sessions completed: 3 DX: COPD gold standard 4  Pulmonologist: Guadalupe Dawn Family MD:   Hasanaj Class Time:  13:00  A.  Exercise Program:  Tolerates exercise @ 3.74 METS for 15 minutes, Walk Test Results:  Pre: Pre walk test not performed patient SOB. and Discharged  B.  Mental Health:  Good mental attitude  C.  Education/Instruction/Skills  Accurately checks own pulse.  Rest:  108  Exercise:  119, Knows THR for exercise, Uses Perceived Exertion Scale and/or Dyspnea Scale and Attended 1 education classes  Demonstrates accurate pursed lip breathing  D.  Nutrition/Weight Control/Body Composition:  Adherence to prescribed nutrition program: good    E.  Blood Lipids    No results found for this basename: CHOL, HDL, LDLCALC, LDLDIRECT, TRIG, CHOLHDL    F.  Lifestyle Changes:  Making positive lifestyle changes  G.  Symptoms noted with exercise:  Asymptomatic  Report Completed By:  Oletta Lamas. Alyan Hartline RN   Comments:  This is patients discharge report. He has done well for 3 sessions . He went out sick and never returned HE achieved a peak METS of 3.74 while in cardiopulmonary rehab. His resting HR was 108 and resting BP was 120/60 his peak HR was 119 and Peak BP was 110/70.

## 2013-03-25 NOTE — Addendum Note (Signed)
Encounter addended by: Norlene Duel, RN on: 03/25/2013  1:46 PM<BR>     Documentation filed: Clinical Notes

## 2013-03-25 NOTE — Addendum Note (Signed)
Encounter addended by: Norlene Duel, RN on: 03/25/2013  1:44 PM<BR>     Documentation filed: Notes Section

## 2013-06-05 ENCOUNTER — Encounter (HOSPITAL_COMMUNITY)
Admission: RE | Admit: 2013-06-05 | Discharge: 2013-06-05 | Disposition: A | Discharge status: Home / Self Care | Payer: Medicaid Other | Source: Ambulatory Visit | Attending: Internal Medicine | Admitting: Internal Medicine

## 2013-06-05 DIAGNOSIS — J449 Chronic obstructive pulmonary disease, unspecified: Secondary | ICD-10-CM | POA: Insufficient documentation

## 2013-06-05 DIAGNOSIS — J4489 Other specified chronic obstructive pulmonary disease: Secondary | ICD-10-CM | POA: Insufficient documentation

## 2013-06-16 ENCOUNTER — Encounter (HOSPITAL_COMMUNITY): Payer: Medicaid Other

## 2013-06-18 ENCOUNTER — Encounter (HOSPITAL_COMMUNITY)
Admission: RE | Admit: 2013-06-18 | Discharge: 2013-06-18 | Disposition: A | Discharge status: Home / Self Care | Payer: Medicaid Other | Source: Ambulatory Visit | Attending: Internal Medicine | Admitting: Internal Medicine

## 2013-06-23 ENCOUNTER — Encounter (HOSPITAL_COMMUNITY)
Admission: RE | Admit: 2013-06-23 | Discharge: 2013-06-23 | Disposition: A | Discharge status: Home / Self Care | Payer: Medicaid Other | Source: Ambulatory Visit | Attending: Internal Medicine | Admitting: Internal Medicine

## 2013-06-25 ENCOUNTER — Encounter (HOSPITAL_COMMUNITY)
Admission: RE | Admit: 2013-06-25 | Discharge: 2013-06-25 | Disposition: A | Discharge status: Home / Self Care | Payer: Medicaid Other | Source: Ambulatory Visit | Attending: Internal Medicine | Admitting: Internal Medicine

## 2013-06-30 ENCOUNTER — Encounter (HOSPITAL_COMMUNITY): Payer: Medicaid Other

## 2013-07-02 ENCOUNTER — Encounter (HOSPITAL_COMMUNITY)
Admission: RE | Admit: 2013-07-02 | Discharge: 2013-07-02 | Disposition: A | Discharge status: Home / Self Care | Payer: Medicaid Other | Source: Ambulatory Visit | Attending: Internal Medicine | Admitting: Internal Medicine

## 2013-07-07 ENCOUNTER — Encounter (HOSPITAL_COMMUNITY)
Admission: RE | Admit: 2013-07-07 | Discharge: 2013-07-07 | Disposition: A | Discharge status: Home / Self Care | Payer: Medicaid Other | Source: Ambulatory Visit | Attending: Internal Medicine | Admitting: Internal Medicine

## 2013-07-07 DIAGNOSIS — J449 Chronic obstructive pulmonary disease, unspecified: Secondary | ICD-10-CM | POA: Insufficient documentation

## 2013-07-07 DIAGNOSIS — J4489 Other specified chronic obstructive pulmonary disease: Secondary | ICD-10-CM | POA: Insufficient documentation

## 2013-07-09 ENCOUNTER — Encounter (HOSPITAL_COMMUNITY)
Admission: RE | Admit: 2013-07-09 | Discharge: 2013-07-09 | Disposition: A | Discharge status: Home / Self Care | Payer: Medicaid Other | Source: Ambulatory Visit | Attending: Internal Medicine | Admitting: Internal Medicine

## 2013-07-14 ENCOUNTER — Encounter (HOSPITAL_COMMUNITY): Payer: Medicaid Other

## 2013-07-16 ENCOUNTER — Encounter (HOSPITAL_COMMUNITY)
Admission: RE | Admit: 2013-07-16 | Discharge: 2013-07-16 | Disposition: A | Discharge status: Home / Self Care | Payer: Medicaid Other | Source: Ambulatory Visit | Attending: Internal Medicine | Admitting: Internal Medicine

## 2013-07-21 ENCOUNTER — Encounter (HOSPITAL_COMMUNITY)
Admission: RE | Admit: 2013-07-21 | Discharge: 2013-07-21 | Disposition: A | Discharge status: Home / Self Care | Payer: Medicaid Other | Source: Ambulatory Visit | Attending: Internal Medicine | Admitting: Internal Medicine

## 2013-07-23 ENCOUNTER — Encounter (HOSPITAL_COMMUNITY): Payer: Medicaid Other

## 2013-07-28 ENCOUNTER — Encounter (HOSPITAL_COMMUNITY): Payer: Medicaid Other

## 2013-07-30 ENCOUNTER — Encounter (HOSPITAL_COMMUNITY): Payer: Medicaid Other

## 2013-08-04 ENCOUNTER — Encounter (HOSPITAL_COMMUNITY): Payer: Medicaid Other

## 2013-08-06 ENCOUNTER — Encounter (HOSPITAL_COMMUNITY): Payer: Medicaid Other

## 2013-08-11 ENCOUNTER — Encounter (HOSPITAL_COMMUNITY): Payer: Medicaid Other

## 2013-08-13 ENCOUNTER — Encounter (HOSPITAL_COMMUNITY): Payer: Medicaid Other

## 2013-08-18 ENCOUNTER — Encounter (HOSPITAL_COMMUNITY): Payer: Medicaid Other

## 2013-08-20 ENCOUNTER — Encounter (HOSPITAL_COMMUNITY): Payer: Medicaid Other

## 2013-08-25 ENCOUNTER — Encounter (HOSPITAL_COMMUNITY): Payer: Medicaid Other

## 2013-08-27 ENCOUNTER — Encounter (HOSPITAL_COMMUNITY): Payer: Medicaid Other

## 2013-09-01 ENCOUNTER — Encounter (HOSPITAL_COMMUNITY): Payer: Medicaid Other

## 2013-09-03 ENCOUNTER — Encounter (HOSPITAL_COMMUNITY): Payer: Medicaid Other

## 2013-09-08 ENCOUNTER — Encounter (HOSPITAL_COMMUNITY): Payer: Medicaid Other

## 2013-09-10 ENCOUNTER — Encounter (HOSPITAL_COMMUNITY): Payer: Medicaid Other

## 2013-09-15 ENCOUNTER — Encounter (HOSPITAL_COMMUNITY): Payer: Medicaid Other

## 2013-09-17 ENCOUNTER — Encounter (HOSPITAL_COMMUNITY): Payer: Medicaid Other

## 2013-09-22 ENCOUNTER — Encounter (HOSPITAL_COMMUNITY): Payer: Medicaid Other

## 2013-09-24 ENCOUNTER — Encounter (HOSPITAL_COMMUNITY): Payer: Medicaid Other

## 2013-09-29 ENCOUNTER — Encounter (HOSPITAL_COMMUNITY): Payer: Medicaid Other

## 2013-10-01 ENCOUNTER — Encounter (HOSPITAL_COMMUNITY): Payer: Medicaid Other

## 2013-10-06 ENCOUNTER — Encounter (HOSPITAL_COMMUNITY): Payer: Medicaid Other

## 2013-10-08 ENCOUNTER — Encounter (HOSPITAL_COMMUNITY): Payer: Medicaid Other

## 2013-10-13 ENCOUNTER — Encounter (HOSPITAL_COMMUNITY): Payer: Medicaid Other

## 2013-10-15 ENCOUNTER — Encounter (HOSPITAL_COMMUNITY): Payer: Medicaid Other

## 2013-10-20 ENCOUNTER — Encounter (HOSPITAL_COMMUNITY): Payer: Medicaid Other

## 2013-10-22 ENCOUNTER — Encounter (HOSPITAL_COMMUNITY): Payer: Medicaid Other

## 2013-10-27 ENCOUNTER — Encounter (HOSPITAL_COMMUNITY)
Admission: RE | Admit: 2013-10-27 | Discharge: 2013-10-27 | Disposition: A | Discharge status: Home / Self Care | Payer: Medicaid Other | Source: Ambulatory Visit | Attending: Internal Medicine | Admitting: Internal Medicine

## 2013-10-27 DIAGNOSIS — J449 Chronic obstructive pulmonary disease, unspecified: Secondary | ICD-10-CM | POA: Insufficient documentation

## 2013-10-27 DIAGNOSIS — J4489 Other specified chronic obstructive pulmonary disease: Secondary | ICD-10-CM | POA: Insufficient documentation

## 2013-10-29 ENCOUNTER — Encounter (HOSPITAL_COMMUNITY)
Admission: RE | Admit: 2013-10-29 | Discharge: 2013-10-29 | Disposition: A | Discharge status: Home / Self Care | Payer: Medicaid Other | Source: Ambulatory Visit | Attending: Internal Medicine | Admitting: Internal Medicine

## 2013-10-29 DIAGNOSIS — J449 Chronic obstructive pulmonary disease, unspecified: Secondary | ICD-10-CM | POA: Diagnosis not present

## 2013-11-03 ENCOUNTER — Encounter (HOSPITAL_COMMUNITY)
Admission: RE | Admit: 2013-11-03 | Discharge: 2013-11-03 | Disposition: A | Discharge status: Home / Self Care | Payer: Medicaid Other | Source: Ambulatory Visit | Attending: Internal Medicine | Admitting: Internal Medicine

## 2013-11-03 DIAGNOSIS — J449 Chronic obstructive pulmonary disease, unspecified: Secondary | ICD-10-CM | POA: Diagnosis not present

## 2013-11-05 ENCOUNTER — Encounter (HOSPITAL_COMMUNITY): Payer: Medicaid Other

## 2013-11-10 ENCOUNTER — Encounter (HOSPITAL_COMMUNITY): Payer: Medicaid Other

## 2013-11-12 ENCOUNTER — Encounter (HOSPITAL_COMMUNITY): Payer: Medicaid Other

## 2013-11-17 ENCOUNTER — Encounter (HOSPITAL_COMMUNITY): Payer: Medicaid Other

## 2013-11-19 ENCOUNTER — Encounter (HOSPITAL_COMMUNITY): Payer: Medicaid Other

## 2013-11-24 ENCOUNTER — Encounter (HOSPITAL_COMMUNITY)
Admission: RE | Admit: 2013-11-24 | Discharge: 2013-11-24 | Disposition: A | Discharge status: Home / Self Care | Payer: Medicaid Other | Source: Ambulatory Visit | Attending: Internal Medicine | Admitting: Internal Medicine

## 2013-11-24 DIAGNOSIS — J4489 Other specified chronic obstructive pulmonary disease: Secondary | ICD-10-CM | POA: Insufficient documentation

## 2013-11-24 DIAGNOSIS — J449 Chronic obstructive pulmonary disease, unspecified: Secondary | ICD-10-CM | POA: Insufficient documentation

## 2013-11-26 ENCOUNTER — Encounter (HOSPITAL_COMMUNITY): Payer: Medicaid Other

## 2013-12-01 ENCOUNTER — Encounter (HOSPITAL_COMMUNITY): Payer: Medicaid Other

## 2013-12-03 ENCOUNTER — Encounter (HOSPITAL_COMMUNITY): Payer: Medicaid Other

## 2013-12-08 ENCOUNTER — Encounter (HOSPITAL_COMMUNITY): Payer: Medicaid Other

## 2013-12-10 ENCOUNTER — Encounter (HOSPITAL_COMMUNITY)
Admission: RE | Admit: 2013-12-10 | Discharge: 2013-12-10 | Disposition: A | Discharge status: Home / Self Care | Payer: Medicaid Other | Source: Ambulatory Visit | Attending: Internal Medicine | Admitting: Internal Medicine

## 2013-12-10 DIAGNOSIS — J449 Chronic obstructive pulmonary disease, unspecified: Secondary | ICD-10-CM | POA: Diagnosis present

## 2013-12-15 ENCOUNTER — Encounter (HOSPITAL_COMMUNITY)
Admission: RE | Admit: 2013-12-15 | Discharge: 2013-12-15 | Disposition: A | Discharge status: Home / Self Care | Payer: Medicaid Other | Source: Ambulatory Visit | Attending: Internal Medicine | Admitting: Internal Medicine

## 2013-12-15 DIAGNOSIS — J449 Chronic obstructive pulmonary disease, unspecified: Secondary | ICD-10-CM | POA: Diagnosis not present

## 2013-12-17 ENCOUNTER — Encounter (HOSPITAL_COMMUNITY): Payer: Medicaid Other

## 2013-12-22 ENCOUNTER — Emergency Department (HOSPITAL_COMMUNITY): Payer: Medicaid Other

## 2013-12-22 ENCOUNTER — Encounter (HOSPITAL_COMMUNITY): Payer: Medicaid Other

## 2013-12-22 ENCOUNTER — Emergency Department (HOSPITAL_COMMUNITY)
Admission: EM | Admit: 2013-12-22 | Discharge: 2013-12-22 | Disposition: A | Discharge status: Home / Self Care | Payer: Medicaid Other | Attending: Emergency Medicine | Admitting: Emergency Medicine

## 2013-12-22 ENCOUNTER — Encounter (HOSPITAL_COMMUNITY): Payer: Self-pay | Admitting: Emergency Medicine

## 2013-12-22 DIAGNOSIS — Z8739 Personal history of other diseases of the musculoskeletal system and connective tissue: Secondary | ICD-10-CM | POA: Insufficient documentation

## 2013-12-22 DIAGNOSIS — Z720 Tobacco use: Secondary | ICD-10-CM | POA: Diagnosis not present

## 2013-12-22 DIAGNOSIS — S91115A Laceration without foreign body of left lesser toe(s) without damage to nail, initial encounter: Secondary | ICD-10-CM | POA: Insufficient documentation

## 2013-12-22 DIAGNOSIS — J449 Chronic obstructive pulmonary disease, unspecified: Secondary | ICD-10-CM | POA: Insufficient documentation

## 2013-12-22 DIAGNOSIS — W293XXA Contact with powered garden and outdoor hand tools and machinery, initial encounter: Secondary | ICD-10-CM | POA: Insufficient documentation

## 2013-12-22 DIAGNOSIS — W231XXA Caught, crushed, jammed, or pinched between stationary objects, initial encounter: Secondary | ICD-10-CM | POA: Diagnosis not present

## 2013-12-22 DIAGNOSIS — Z79899 Other long term (current) drug therapy: Secondary | ICD-10-CM | POA: Insufficient documentation

## 2013-12-22 DIAGNOSIS — G629 Polyneuropathy, unspecified: Secondary | ICD-10-CM | POA: Diagnosis not present

## 2013-12-22 DIAGNOSIS — Z792 Long term (current) use of antibiotics: Secondary | ICD-10-CM | POA: Insufficient documentation

## 2013-12-22 DIAGNOSIS — Z7951 Long term (current) use of inhaled steroids: Secondary | ICD-10-CM | POA: Insufficient documentation

## 2013-12-22 DIAGNOSIS — S91119A Laceration without foreign body of unspecified toe without damage to nail, initial encounter: Secondary | ICD-10-CM

## 2013-12-22 MED ORDER — HYDROMORPHONE HCL 1 MG/ML IJ SOLN
1.0000 mg | Freq: Once | INTRAMUSCULAR | Status: AC
  Administered 2013-12-22: 1 mg via INTRAVENOUS
  Filled 2013-12-22: qty 1

## 2013-12-22 MED ORDER — LIDOCAINE HCL (PF) 2 % IJ SOLN
INTRAMUSCULAR | Status: AC
  Administered 2013-12-22: 10 mL
  Filled 2013-12-22: qty 10

## 2013-12-22 MED ORDER — HYDROMORPHONE HCL 1 MG/ML IJ SOLN
INTRAMUSCULAR | Status: AC
  Filled 2013-12-22: qty 1

## 2013-12-22 MED ORDER — DEXTROSE 5 % IV SOLN
1.0000 g | Freq: Once | INTRAVENOUS | Status: AC
  Administered 2013-12-22: 1 g via INTRAVENOUS
  Filled 2013-12-22: qty 10

## 2013-12-22 MED ORDER — CEPHALEXIN 500 MG PO CAPS: 500.0000 mg | ORAL_CAPSULE | Freq: Four times a day (QID) | ORAL | Status: DC

## 2013-12-22 MED ORDER — HYDROMORPHONE HCL 1 MG/ML IJ SOLN
1.0000 mg | Freq: Once | INTRAMUSCULAR | Status: AC
Start: 2013-12-22 — End: 2013-12-22
  Administered 2013-12-22: 1 mg via INTRAVENOUS
  Filled 2013-12-22: qty 1

## 2013-12-22 NOTE — ED Provider Notes (Addendum)
CSN: [id number]     Arrival date & time 12/22/13  1434 History   First MD Initiated Contact with Patient 12/22/13 1507     Chief Complaint  Patient presents with  . Laceration     (Consider location/radiation/quality/duration/timing/severity/associated sxs/prior Treatment) HPI Comments: Patient is a 48 year old male with history of COPD. He presents with complaints of lacerations to his left third and fourth toes that occurred when he was operating a chain saw which kicked back and caught his foot. The chain saw cut through his shoe and into his toes.  Patient is a 48 y.o. male presenting with skin laceration. The history is provided by the patient.  Laceration Location:  Foot Foot laceration location:  L foot (3rd and 4th toes) Length (cm):  2.5, 2.5 Quality: jagged   Injury mechanism: chainsaw. Relieved by:  Nothing Worsened by:  Nothing tried   Past Medical History  Diagnosis Date  . COPD (chronic obstructive pulmonary disease)   . Osteoporosis   . Neuropathy    Past Surgical History  Procedure Laterality Date  . Joint replacement    . Back surgery    . Lung surgery     Family History  Problem Relation Age of Onset  . Heart failure Father   . Diabetes Father   . Diabetes Mother   . Cancer Mother     male cancer   History  Substance Use Topics  . Smoking status: Current Every Day Smoker -- 1.00 packs/day    Last Attempt to Quit: 07/05/2011  . Smokeless tobacco: Not on file  . Alcohol Use: No    Review of Systems  All other systems reviewed and are negative.     Allergies  Ambien  Home Medications   Prior to Admission medications   Medication Sig Start Date End Date Taking? Authorizing Provider  albuterol (PROVENTIL HFA;VENTOLIN HFA) 108 (90 BASE) MCG/ACT inhaler Inhale 2 puffs into the lungs every 4 (four) hours as needed for wheezing or shortness of breath. Shortness of breath 01/06/12   Delfina Redwood, MD  albuterol (PROVENTIL) (2.5 MG/3ML)  0.083% nebulizer solution Take 2.5 mg by nebulization every 6 (six) hours as needed for wheezing or shortness of breath.     Historical Provider, MD  amoxicillin-clavulanate (AUGMENTIN) 875-125 MG per tablet Take 1 tablet by mouth 2 (two) times daily.    Historical Provider, MD  budesonide-formoterol (SYMBICORT) 160-4.5 MCG/ACT inhaler Inhale 3 puffs into the lungs 4 (four) times daily.    Historical Provider, MD  Fluticasone-Salmeterol (ADVAIR) 250-50 MCG/DOSE AEPB Inhale 1 puff into the lungs every 12 (twelve) hours.    Historical Provider, MD  furosemide (LASIX) 40 MG tablet Take 40 mg by mouth daily.    Historical Provider, MD  gabapentin (NEURONTIN) 300 MG capsule Take 300 mg by mouth every 8 (eight) hours.    Historical Provider, MD  nicotine (NICODERM CQ - DOSED IN MG/24 HOURS) 14 mg/24hr patch Place 14 mg onto the skin daily.    Historical Provider, MD  Oxycodone HCl 10 MG TABS Take 10-20 mg by mouth every 3 (three) hours as needed (for severe pain).    Historical Provider, MD  oxyCODONE-acetaminophen (PERCOCET) 10-325 MG per tablet Take 1 tablet by mouth every 4 (four) hours as needed for pain. 02/04/13   Nat Christen, MD  Polyethyl Glycol-Propyl Glycol (SYSTANE) 0.4-0.3 % SOLN Place 1-2 drops into the left eye daily as needed (for dry eye relief).     Historical Provider, MD  potassium  chloride SA (K-DUR,KLOR-CON) 20 MEQ tablet Take 20 mEq by mouth daily.    Historical Provider, MD  promethazine (PHENERGAN) 25 MG tablet Take 1 tablet (25 mg total) by mouth every 6 (six) hours as needed for nausea or vomiting. 02/04/13   Nat Christen, MD  senna (SENOKOT) 8.6 MG tablet Take 2 tablets by mouth 2 (two) times daily.    Historical Provider, MD  tiotropium (SPIRIVA) 18 MCG inhalation capsule Place 1 capsule (18 mcg total) into inhaler and inhale daily. 07/24/11   Nimish C Anastasio Champion, MD   BP 101/65  Pulse 97  Temp(Src) 97.5 F (36.4 C) (Oral)  Resp 16  Ht 6\' 1"  (1.854 m)  Wt 174 lb (78.926 kg)  BMI  22.96 kg/m2  SpO2 97% Physical Exam  Nursing note and vitals reviewed. Constitutional: He is oriented to person, place, and time. He appears well-developed and well-nourished. No distress.  HENT:  Head: Normocephalic and atraumatic.  Neck: Normal range of motion. Neck supple.  Musculoskeletal:  The left third and fourth toes are noted to have lacerations to the dorsal aspect.  Neurological: He is alert and oriented to person, place, and time.  Skin: Skin is warm and dry. He is not diaphoretic.    ED Course  Procedures (including critical care time) Labs Review Labs Reviewed - No data to display  Imaging Review No results found.   EKG Interpretation None     LACERATION REPAIR Performed by: Veryl Speak Authorized by: Veryl Speak Consent: Verbal consent obtained. Risks and benefits: risks, benefits and alternatives were discussed Consent given by: patient Patient identity confirmed: provided demographic data Prepped and Draped in normal sterile fashion Wound explored  Laceration Location: Left third and fourth toes  Laceration Length: 2.5, 2.5 cm  No Foreign Bodies seen or palpated  Anesthesia: Digital block   Local anesthetic: lidocaine 2% without epinephrine  Anesthetic total: 5 ml  Irrigation method: syringe Amount of cleaning: standard  Skin closure: 4-0 Ethilon   Number of sutures: 2, 3   Technique: Simple interrupted   Patient tolerance: Patient tolerated the procedure well with no immediate complications.   MDM   Final diagnoses:  None    Patient presents here after a chainsaw injury to the third and fourth toes of his left foot. He has lacerations to the dorsal aspect which are jagged with areas of missing tissue. There was some debris present and the wounds were irrigated copiously. The wounds were closed and he tolerated this well. He will be treated with IV ceftriaxone, and Keflex and his pain meds at home. He is to followup with Dr.  Aline Brochure in 2 days for a wound check.  The repair was performed in consultation with Dr. Aline Brochure who did not feel as though any further intervention other than closure was indicated.  Veryl Speak, MD 12/22/13 [id number]  Veryl Speak, MD 12/22/13 [phone]

## 2013-12-22 NOTE — Discharge Instructions (Signed)
Keflex as prescribed.  Local wound care with bacitracin and dressing changes twice daily.  Followup with Dr. Aline Brochure in 2 days for a recheck, and return to the ER if he develops significant redness, purulent drainage, or other new and concerning symptoms   Laceration Care, Adult A laceration is a cut or lesion that goes through all layers of the skin and into the tissue just beneath the skin. TREATMENT  Some lacerations may not require closure. Some lacerations may not be able to be closed due to an increased risk of infection. It is important to see your caregiver as soon as possible after an injury to minimize the risk of infection and maximize the opportunity for successful closure. If closure is appropriate, pain medicines may be given, if needed. The wound will be cleaned to help prevent infection. Your caregiver will use stitches (sutures), staples, wound glue (adhesive), or skin adhesive strips to repair the laceration. These tools bring the skin edges together to allow for faster healing and a better cosmetic outcome. However, all wounds will heal with a scar. Once the wound has healed, scarring can be minimized by covering the wound with sunscreen during the day for 1 full year. HOME CARE INSTRUCTIONS  For sutures or staples:  Keep the wound clean and dry.  If you were given a bandage (dressing), you should change it at least once a day. Also, change the dressing if it becomes wet or dirty, or as directed by your caregiver.  Wash the wound with soap and water 2 times a day. Rinse the wound off with water to remove all soap. Pat the wound dry with a clean towel.  After cleaning, apply a thin layer of the antibiotic ointment as recommended by your caregiver. This will help prevent infection and keep the dressing from sticking.  You may shower as usual after the first 24 hours. Do not soak the wound in water until the sutures are removed.  Only take over-the-counter or prescription  medicines for pain, discomfort, or fever as directed by your caregiver.  Get your sutures or staples removed as directed by your caregiver. For skin adhesive strips:  Keep the wound clean and dry.  Do not get the skin adhesive strips wet. You may bathe carefully, using caution to keep the wound dry.  If the wound gets wet, pat it dry with a clean towel.  Skin adhesive strips will fall off on their own. You may trim the strips as the wound heals. Do not remove skin adhesive strips that are still stuck to the wound. They will fall off in time. For wound adhesive:  You may briefly wet your wound in the shower or bath. Do not soak or scrub the wound. Do not swim. Avoid periods of heavy perspiration until the skin adhesive has fallen off on its own. After showering or bathing, gently pat the wound dry with a clean towel.  Do not apply liquid medicine, cream medicine, or ointment medicine to your wound while the skin adhesive is in place. This may loosen the film before your wound is healed.  If a dressing is placed over the wound, be careful not to apply tape directly over the skin adhesive. This may cause the adhesive to be pulled off before the wound is healed.  Avoid prolonged exposure to sunlight or tanning lamps while the skin adhesive is in place. Exposure to ultraviolet light in the first year will darken the scar.  The skin adhesive will usually remain  in place for 5 to 10 days, then naturally fall off the skin. Do not pick at the adhesive film. You may need a tetanus shot if:  You cannot remember when you had your last tetanus shot.  You have never had a tetanus shot. If you get a tetanus shot, your arm may swell, get red, and feel warm to the touch. This is common and not a problem. If you need a tetanus shot and you choose not to have one, there is a rare chance of getting tetanus. Sickness from tetanus can be serious. SEEK MEDICAL CARE IF:   You have redness, swelling, or  increasing pain in the wound.  You see a red line that goes away from the wound.  You have yellowish-white fluid (pus) coming from the wound.  You have a fever.  You notice a bad smell coming from the wound or dressing.  Your wound breaks open before or after sutures have been removed.  You notice something coming out of the wound such as wood or glass.  Your wound is on your hand or foot and you cannot move a finger or toe. SEEK IMMEDIATE MEDICAL CARE IF:   Your pain is not controlled with prescribed medicine.  You have severe swelling around the wound causing pain and numbness or a change in color in your arm, hand, leg, or foot.  Your wound splits open and starts bleeding.  You have worsening numbness, weakness, or loss of function of any joint around or beyond the wound.  You develop painful lumps near the wound or on the skin anywhere on your body. MAKE SURE YOU:   Understand these instructions.  Will watch your condition.  Will get help right away if you are not doing well or get worse. Document Released: 02/20/2005 Document Revised: 05/15/2011 Document Reviewed: 08/16/2010 [facility] Patient Information 2015 [place], [place]. This information is not intended to replace advice given to you by your health care provider. Make sure you discuss any questions you have with your health care provider.

## 2013-12-22 NOTE — ED Notes (Signed)
Paged Dr. Aline Brochure to [phone]

## 2013-12-22 NOTE — ED Notes (Signed)
Pt. States he was cutting wood and the chainsaw kicked back.  Pt. Has 3cm lacerations on 3rd and 4th toes of left foot.

## 2013-12-24 ENCOUNTER — Encounter (HOSPITAL_COMMUNITY): Payer: Medicaid Other

## 2013-12-29 ENCOUNTER — Encounter (HOSPITAL_COMMUNITY): Payer: Medicaid Other

## 2013-12-31 ENCOUNTER — Encounter (HOSPITAL_COMMUNITY): Payer: Medicaid Other

## 2014-01-05 ENCOUNTER — Encounter (HOSPITAL_COMMUNITY): Payer: Medicaid Other

## 2014-02-24 ENCOUNTER — Emergency Department (HOSPITAL_COMMUNITY): Payer: Medicare Other

## 2014-02-24 ENCOUNTER — Inpatient Hospital Stay (HOSPITAL_COMMUNITY)
Admission: EM | Admit: 2014-02-24 | Discharge: 2014-02-27 | DRG: 190 | Disposition: A | Discharge status: Home Health | Payer: Medicare Other | Attending: Internal Medicine | Admitting: Internal Medicine

## 2014-02-24 ENCOUNTER — Encounter (HOSPITAL_COMMUNITY): Payer: Self-pay

## 2014-02-24 DIAGNOSIS — Z79891 Long term (current) use of opiate analgesic: Secondary | ICD-10-CM | POA: Diagnosis not present

## 2014-02-24 DIAGNOSIS — Z902 Acquired absence of lung [part of]: Secondary | ICD-10-CM | POA: Diagnosis present

## 2014-02-24 DIAGNOSIS — Z8249 Family history of ischemic heart disease and other diseases of the circulatory system: Secondary | ICD-10-CM

## 2014-02-24 DIAGNOSIS — J9621 Acute and chronic respiratory failure with hypoxia: Secondary | ICD-10-CM

## 2014-02-24 DIAGNOSIS — J9620 Acute and chronic respiratory failure, unspecified whether with hypoxia or hypercapnia: Secondary | ICD-10-CM | POA: Diagnosis present

## 2014-02-24 DIAGNOSIS — J209 Acute bronchitis, unspecified: Secondary | ICD-10-CM | POA: Diagnosis present

## 2014-02-24 DIAGNOSIS — M810 Age-related osteoporosis without current pathological fracture: Secondary | ICD-10-CM | POA: Diagnosis present

## 2014-02-24 DIAGNOSIS — Z23 Encounter for immunization: Secondary | ICD-10-CM

## 2014-02-24 DIAGNOSIS — R0602 Shortness of breath: Secondary | ICD-10-CM

## 2014-02-24 DIAGNOSIS — I959 Hypotension, unspecified: Secondary | ICD-10-CM | POA: Diagnosis present

## 2014-02-24 DIAGNOSIS — G8929 Other chronic pain: Secondary | ICD-10-CM | POA: Diagnosis present

## 2014-02-24 DIAGNOSIS — J441 Chronic obstructive pulmonary disease with (acute) exacerbation: Secondary | ICD-10-CM | POA: Diagnosis present

## 2014-02-24 DIAGNOSIS — R000 Tachycardia, unspecified: Secondary | ICD-10-CM | POA: Diagnosis present

## 2014-02-24 DIAGNOSIS — E876 Hypokalemia: Secondary | ICD-10-CM | POA: Diagnosis present

## 2014-02-24 DIAGNOSIS — Z79899 Other long term (current) drug therapy: Secondary | ICD-10-CM | POA: Diagnosis not present

## 2014-02-24 DIAGNOSIS — Z833 Family history of diabetes mellitus: Secondary | ICD-10-CM | POA: Diagnosis not present

## 2014-02-24 HISTORY — DX: Idiopathic aseptic necrosis of unspecified femur: M87.059

## 2014-02-24 HISTORY — DX: Dorsalgia, unspecified: M54.9

## 2014-02-24 LAB — BASIC METABOLIC PANEL
Anion gap: 7 (ref 5–15)
BUN: 10 mg/dL (ref 6–23)
CALCIUM: 9.3 mg/dL (ref 8.4–10.5)
CO2: 25 mmol/L (ref 19–32)
CREATININE: 0.9 mg/dL (ref 0.50–1.35)
Chloride: 108 mEq/L (ref 96–112)
GFR calc non Af Amer: >90 mL/min (ref >90)
GLUCOSE: 92 mg/dL (ref 70–99)
Potassium: 3.4 mmol/L — ABNORMAL LOW (ref 3.5–5.1)
SODIUM: 140 mmol/L (ref 135–145)

## 2014-02-24 LAB — CBC WITH DIFFERENTIAL/PLATELET
Basophils Absolute: 0.1 10*3/uL (ref 0.0–0.1)
Basophils Relative: 1 % (ref 0–1)
Eosinophils Absolute: 0 10*3/uL (ref 0.0–0.7)
Eosinophils Relative: 1 % (ref 0–5)
HEMATOCRIT: 43.7 % (ref 39.0–52.0)
HEMOGLOBIN: 14.9 g/dL (ref 13.0–17.0)
LYMPHS ABS: 1 10*3/uL (ref 0.7–4.0)
Lymphocytes Relative: 11 % — ABNORMAL LOW (ref 12–46)
MCH: 31.8 pg (ref 26.0–34.0)
MCHC: 34.1 g/dL (ref 30.0–36.0)
MCV: 93.2 fL (ref 78.0–100.0)
MONO ABS: 1 10*3/uL (ref 0.1–1.0)
MONOS PCT: 12 % (ref 3–12)
NEUTROS ABS: 6.6 10*3/uL (ref 1.7–7.7)
Neutrophils Relative %: 75 % (ref 43–77)
Platelets: 363 10*3/uL (ref 150–400)
RBC: 4.69 MIL/uL (ref 4.22–5.81)
RDW: 12.2 % (ref 11.5–15.5)
WBC: 8.7 10*3/uL (ref 4.0–10.5)

## 2014-02-24 LAB — BLOOD GAS, ARTERIAL
ACID-BASE EXCESS: 0.4 mmol/L (ref 0.0–2.0)
Bicarbonate: 24.5 mEq/L — ABNORMAL HIGH (ref 20.0–24.0)
Drawn by: 25788
O2 Saturation: 96.1 %
PO2 ART: 79.9 mmHg — AB (ref 80.0–100.0)
Patient temperature: 37
TCO2: 21.5 mmol/L (ref 0–100)
pCO2 arterial: 39.5 mmHg (ref 35.0–45.0)
pH, Arterial: 7.409 (ref 7.350–7.450)

## 2014-02-24 MED ORDER — TIOTROPIUM BROMIDE MONOHYDRATE 18 MCG IN CAPS
18.0000 ug | ORAL_CAPSULE | Freq: Every day | RESPIRATORY_TRACT | Status: DC
  Administered 2014-02-25 – 2014-02-27 (×3): 18 ug via RESPIRATORY_TRACT
  Filled 2014-02-24: qty 5

## 2014-02-24 MED ORDER — NICOTINE 14 MG/24HR TD PT24
14.0000 mg | MEDICATED_PATCH | TRANSDERMAL | Status: DC
  Administered 2014-02-24 – 2014-02-26 (×3): 14 mg via TRANSDERMAL
  Filled 2014-02-24 (×4): qty 1

## 2014-02-24 MED ORDER — HYDROMORPHONE HCL 1 MG/ML IJ SOLN
1.0000 mg | Freq: Once | INTRAMUSCULAR | Status: AC
  Administered 2014-02-24: 1 mg via INTRAVENOUS

## 2014-02-24 MED ORDER — HYDROMORPHONE HCL 1 MG/ML IJ SOLN
1.0000 mg | Freq: Once | INTRAMUSCULAR | Status: AC
Start: 2014-02-24 — End: 2014-02-25
  Administered 2014-02-24: 1 mg via INTRAVENOUS
  Filled 2014-02-24: qty 1

## 2014-02-24 MED ORDER — POTASSIUM CHLORIDE CRYS ER 20 MEQ PO TBCR
40.0000 meq | EXTENDED_RELEASE_TABLET | Freq: Once | ORAL | Status: AC
  Administered 2014-02-24: 40 meq via ORAL
  Filled 2014-02-24: qty 2

## 2014-02-24 MED ORDER — POLYETHYL GLYCOL-PROPYL GLYCOL 0.4-0.3 % OP SOLN
1.0000 [drp] | Freq: Every day | OPHTHALMIC | Status: DC | PRN
  Filled 2014-02-24: qty 15

## 2014-02-24 MED ORDER — SODIUM CHLORIDE 0.9 % IJ SOLN
3.0000 mL | INTRAMUSCULAR | Status: DC | PRN
  Administered 2014-02-25: 3 mL via INTRAVENOUS
  Filled 2014-02-24: qty 3

## 2014-02-24 MED ORDER — ONDANSETRON HCL 4 MG/2ML IJ SOLN: 4.0000 mg | Freq: Four times a day (QID) | INTRAMUSCULAR | Status: DC | PRN

## 2014-02-24 MED ORDER — ENOXAPARIN SODIUM 40 MG/0.4ML ~~LOC~~ SOLN
40.0000 mg | SUBCUTANEOUS | Status: DC
  Administered 2014-02-24 – 2014-02-26 (×3): 40 mg via SUBCUTANEOUS
  Filled 2014-02-24 (×3): qty 0.4

## 2014-02-24 MED ORDER — IPRATROPIUM BROMIDE 0.02 % IN SOLN: 0.5000 mg | Freq: Four times a day (QID) | RESPIRATORY_TRACT | Status: DC

## 2014-02-24 MED ORDER — LEVALBUTEROL HCL 0.63 MG/3ML IN NEBU
0.6300 mg | INHALATION_SOLUTION | RESPIRATORY_TRACT | Status: DC
  Administered 2014-02-24 – 2014-02-27 (×17): 0.63 mg via RESPIRATORY_TRACT
  Filled 2014-02-24 (×17): qty 3

## 2014-02-24 MED ORDER — OXYCODONE HCL 5 MG PO TABS
30.0000 mg | ORAL_TABLET | ORAL | Status: DC | PRN
  Administered 2014-02-24 – 2014-02-27 (×17): 30 mg via ORAL
  Filled 2014-02-24 (×17): qty 6

## 2014-02-24 MED ORDER — ACETAMINOPHEN 650 MG RE SUPP: 650.0000 mg | Freq: Four times a day (QID) | RECTAL | Status: DC | PRN

## 2014-02-24 MED ORDER — SODIUM CHLORIDE 0.9 % IJ SOLN
3.0000 mL | Freq: Two times a day (BID) | INTRAMUSCULAR | Status: DC
  Administered 2014-02-24 – 2014-02-27 (×6): 3 mL via INTRAVENOUS

## 2014-02-24 MED ORDER — BUDESONIDE-FORMOTEROL FUMARATE 160-4.5 MCG/ACT IN AERO
2.0000 | INHALATION_SPRAY | Freq: Two times a day (BID) | RESPIRATORY_TRACT | Status: DC
  Filled 2014-02-24: qty 6

## 2014-02-24 MED ORDER — ONDANSETRON HCL 4 MG PO TABS: 4.0000 mg | ORAL_TABLET | Freq: Four times a day (QID) | ORAL | Status: DC | PRN

## 2014-02-24 MED ORDER — GABAPENTIN 300 MG PO CAPS
300.0000 mg | ORAL_CAPSULE | Freq: Three times a day (TID) | ORAL | Status: DC
  Administered 2014-02-24 – 2014-02-27 (×9): 300 mg via ORAL
  Filled 2014-02-24 (×9): qty 1

## 2014-02-24 MED ORDER — LEVOFLOXACIN IN D5W 500 MG/100ML IV SOLN
500.0000 mg | INTRAVENOUS | Status: DC
  Administered 2014-02-24 – 2014-02-25 (×2): 500 mg via INTRAVENOUS
  Filled 2014-02-24 (×2): qty 100

## 2014-02-24 MED ORDER — MORPHINE SULFATE ER 30 MG PO TBCR
30.0000 mg | EXTENDED_RELEASE_TABLET | Freq: Three times a day (TID) | ORAL | Status: DC
  Administered 2014-02-24 – 2014-02-27 (×9): 30 mg via ORAL
  Filled 2014-02-24 (×9): qty 1

## 2014-02-24 MED ORDER — HYDROMORPHONE HCL 1 MG/ML IJ SOLN
INTRAMUSCULAR | Status: AC
  Filled 2014-02-24: qty 1

## 2014-02-24 MED ORDER — HYDROCODONE-HOMATROPINE 5-1.5 MG/5ML PO SYRP
5.0000 mL | ORAL_SOLUTION | Freq: Four times a day (QID) | ORAL | Status: DC | PRN
  Administered 2014-02-25 – 2014-02-26 (×6): 5 mL via ORAL
  Filled 2014-02-24 (×7): qty 5

## 2014-02-24 MED ORDER — BUDESONIDE-FORMOTEROL FUMARATE 160-4.5 MCG/ACT IN AERO
INHALATION_SPRAY | RESPIRATORY_TRACT | Status: AC
  Filled 2014-02-24: qty 6

## 2014-02-24 MED ORDER — ONDANSETRON HCL 4 MG/2ML IJ SOLN: 4.0000 mg | Freq: Once | INTRAMUSCULAR | Status: AC

## 2014-02-24 MED ORDER — BUDESONIDE-FORMOTEROL FUMARATE 160-4.5 MCG/ACT IN AERO
3.0000 | INHALATION_SPRAY | Freq: Four times a day (QID) | RESPIRATORY_TRACT | Status: DC
  Filled 2014-02-24: qty 6

## 2014-02-24 MED ORDER — SODIUM CHLORIDE 0.9 % IV SOLN
Freq: Once | INTRAVENOUS | Status: AC
  Administered 2014-02-24: 500 mL/h via INTRAVENOUS

## 2014-02-24 MED ORDER — HYDROMORPHONE HCL 1 MG/ML IJ SOLN
1.0000 mg | Freq: Once | INTRAMUSCULAR | Status: AC
  Administered 2014-02-24: 1 mg via INTRAVENOUS
  Filled 2014-02-24: qty 1

## 2014-02-24 MED ORDER — GUAIFENESIN 100 MG/5ML PO SOLN
5.0000 mL | Freq: Once | ORAL | Status: AC
  Administered 2014-02-24: 100 mg via ORAL
  Filled 2014-02-24: qty 5

## 2014-02-24 MED ORDER — POTASSIUM CHLORIDE CRYS ER 20 MEQ PO TBCR
20.0000 meq | EXTENDED_RELEASE_TABLET | Freq: Every day | ORAL | Status: DC
  Administered 2014-02-25 – 2014-02-27 (×3): 20 meq via ORAL
  Filled 2014-02-24 (×3): qty 1

## 2014-02-24 MED ORDER — LEVALBUTEROL HCL 0.63 MG/3ML IN NEBU: 0.6300 mg | INHALATION_SOLUTION | RESPIRATORY_TRACT | Status: DC | PRN

## 2014-02-24 MED ORDER — LEVALBUTEROL HCL 0.63 MG/3ML IN NEBU
0.6300 mg | INHALATION_SOLUTION | RESPIRATORY_TRACT | Status: DC
Start: 2014-02-24 — End: 2014-02-24

## 2014-02-24 MED ORDER — LEVOFLOXACIN IN D5W 500 MG/100ML IV SOLN
500.0000 mg | Freq: Once | INTRAVENOUS | Status: AC
  Administered 2014-02-24: 500 mg via INTRAVENOUS
  Filled 2014-02-24: qty 100

## 2014-02-24 MED ORDER — ONDANSETRON HCL 4 MG/2ML IJ SOLN
INTRAMUSCULAR | Status: AC
  Filled 2014-02-24: qty 2

## 2014-02-24 MED ORDER — ONDANSETRON HCL 4 MG/2ML IJ SOLN
4.0000 mg | Freq: Once | INTRAMUSCULAR | Status: AC
Start: 2014-02-24 — End: 2014-02-25
  Administered 2014-02-24: 4 mg via INTRAVENOUS

## 2014-02-24 MED ORDER — BUDESONIDE-FORMOTEROL FUMARATE 160-4.5 MCG/ACT IN AERO
3.0000 | INHALATION_SPRAY | Freq: Four times a day (QID) | RESPIRATORY_TRACT | Status: DC
  Administered 2014-02-25 – 2014-02-27 (×11): 3 via RESPIRATORY_TRACT
  Filled 2014-02-24: qty 6

## 2014-02-24 MED ORDER — SODIUM CHLORIDE 0.9 % IV SOLN: 250.0000 mL | INTRAVENOUS | Status: DC | PRN

## 2014-02-24 MED ORDER — ALBUTEROL (5 MG/ML) CONTINUOUS INHALATION SOLN
20.0000 mg/h | INHALATION_SOLUTION | Freq: Once | RESPIRATORY_TRACT | Status: AC
  Administered 2014-02-24: 20 mg/h via RESPIRATORY_TRACT
  Filled 2014-02-24: qty 20

## 2014-02-24 MED ORDER — FENTANYL CITRATE 0.05 MG/ML IJ SOLN
100.0000 ug | Freq: Once | INTRAMUSCULAR | Status: AC
  Administered 2014-02-24: 100 ug via INTRAVENOUS
  Filled 2014-02-24: qty 2

## 2014-02-24 MED ORDER — METHYLPREDNISOLONE SODIUM SUCC 125 MG IJ SOLR
125.0000 mg | Freq: Once | INTRAMUSCULAR | Status: AC
  Administered 2014-02-24: 125 mg via INTRAVENOUS
  Filled 2014-02-24: qty 2

## 2014-02-24 MED ORDER — METHYLPREDNISOLONE SODIUM SUCC 125 MG IJ SOLR
60.0000 mg | Freq: Four times a day (QID) | INTRAMUSCULAR | Status: DC
  Administered 2014-02-24 – 2014-02-27 (×12): 60 mg via INTRAVENOUS
  Filled 2014-02-24 (×12): qty 2

## 2014-02-24 MED ORDER — ACETAMINOPHEN 325 MG PO TABS: 650.0000 mg | ORAL_TABLET | Freq: Four times a day (QID) | ORAL | Status: DC | PRN

## 2014-02-24 MED ORDER — SENNA 8.6 MG PO TABS
2.0000 | ORAL_TABLET | Freq: Two times a day (BID) | ORAL | Status: DC
  Administered 2014-02-24 – 2014-02-27 (×6): 17.2 mg via ORAL
  Filled 2014-02-24 (×16): qty 2

## 2014-02-24 MED ORDER — GUAIFENESIN ER 600 MG PO TB12
1200.0000 mg | ORAL_TABLET | Freq: Two times a day (BID) | ORAL | Status: DC
  Administered 2014-02-24 – 2014-02-27 (×6): 1200 mg via ORAL
  Filled 2014-02-24 (×6): qty 2

## 2014-02-24 MED ORDER — FUROSEMIDE 40 MG PO TABS
40.0000 mg | ORAL_TABLET | Freq: Every day | ORAL | Status: DC
  Administered 2014-02-24 – 2014-02-25 (×2): 40 mg via ORAL
  Filled 2014-02-24 (×2): qty 1

## 2014-02-24 MED ORDER — SODIUM CHLORIDE 0.9 % IV BOLUS (SEPSIS)
500.0000 mL | Freq: Once | INTRAVENOUS | Status: AC
  Administered 2014-02-24: 500 mL via INTRAVENOUS

## 2014-02-24 NOTE — H&P (Signed)
[facility] History and Physical  MONTANA FASSNACHT FGH:[id number] DOB: 09/19/1965 DOA: 02/24/2014  Referring physician: Dr. Eulis Foster, ER physician PCP: [name], MD   Chief Complaint: shortness of breath  HPI: Wesley [name] is a 48 y.o. male with a history of severe COPD status post lung reduction surgery at [facility] in the past. Patient reports that he was in his usual state of health when approximately 5 days ago he developed onset of nasal congestion, sore throat and upper respiratory tract infection symptoms. This progressed to progressive wheezing shortness of breath. He was using his bronchodilators every 3 hours without any significant relief. He denies any fevers. He has a chronic cough. He has increasing right-sided pain in his rib cage with coughing. He is coughing to the point that he said multiple episodes of vomiting. Denies any abdominal pain. No significant diarrhea. He has not had any sick contacts. On arrival to the emergency room he was noted to be tachycardic, tachypneic and wheezing. He has received a continuous nebulizer treatments with some improvement. He is being admitted for further treatments.   Review of Systems:  Pertinent positives as per HPI, otherwise negative   Past Medical History  Diagnosis Date  . COPD (chronic obstructive pulmonary disease)   . Osteoporosis   . Neuropathy   . Back pain   . Depression   . Avascular necrosis of femur    Past Surgical History  Procedure Laterality Date  . Joint replacement    . Back surgery    . Lung surgery     Social History:  reports that he has been smoking.  He does not have any smokeless tobacco history on file. He reports that he does not drink alcohol or use illicit drugs.  Allergies  Allergen Reactions  . Ambien [Zolpidem Tartrate] Other (See Comments)    hallucinations    Family History  Problem Relation Age of Onset  . Heart failure Father   . Diabetes Father   . Diabetes  Mother   . Cancer Mother     male cancer     Prior to Admission medications   Medication Sig Start Date End Date Taking? Authorizing Provider  albuterol (PROVENTIL HFA;VENTOLIN HFA) 108 (90 BASE) MCG/ACT inhaler Inhale 2 puffs into the lungs every 4 (four) hours as needed for wheezing or shortness of breath. Shortness of breath 01/06/12  Yes Delfina Redwood, MD  albuterol (PROVENTIL) (2.5 MG/3ML) 0.083% nebulizer solution Take 2.5 mg by nebulization every 6 (six) hours as needed for wheezing or shortness of breath.    Yes Historical Provider, MD  amoxicillin-clavulanate (AUGMENTIN) 875-125 MG per tablet Take 1 tablet by mouth 2 (two) times daily.   Yes Historical Provider, MD  budesonide-formoterol (SYMBICORT) 160-4.5 MCG/ACT inhaler Inhale 3 puffs into the lungs 4 (four) times daily.   Yes Historical Provider, MD  Fluticasone-Salmeterol (ADVAIR) 250-50 MCG/DOSE AEPB Inhale 1 puff into the lungs every 12 (twelve) hours.   Yes Historical Provider, MD  furosemide (LASIX) 40 MG tablet Take 40 mg by mouth daily.   Yes Historical Provider, MD  gabapentin (NEURONTIN) 300 MG capsule Take 300 mg by mouth every 8 (eight) hours.   Yes Historical Provider, MD  morphine (MS CONTIN) 30 MG 12 hr tablet Take 30 mg by mouth 3 (three) times daily. 01/05/14  Yes Historical Provider, MD  Naloxone HCl (EVZIO) 0.4 MG/0.4ML SOAJ Inject as directed once as needed (EVZIO is an opioid antagonist indicated for the emergency treatment of known  or suspected opioid overdose, as manifested by respiratory and/or central nervous system depression. EVZIO is intended for immediate administration as emergency therapy in settings where opioids may be present. EVZIO is not a substitute for emergency medical care.).   Yes Historical Provider, MD  nicotine (NICODERM CQ - DOSED IN MG/24 HOURS) 14 mg/24hr patch Place 14 mg onto the skin daily.   Yes Historical Provider, MD  oxyCODONE (ROXICODONE) 15 MG immediate release tablet Take 30  mg by mouth every 4 (four) hours as needed for pain.  01/05/14  Yes Historical Provider, MD  Polyethyl Glycol-Propyl Glycol (SYSTANE) 0.4-0.3 % SOLN Place 1-2 drops into the left eye daily as needed (for dry eye relief).    Yes Historical Provider, MD  potassium chloride SA (K-DUR,KLOR-CON) 20 MEQ tablet Take 20 mEq by mouth daily.   Yes Historical Provider, MD  senna (SENOKOT) 8.6 MG tablet Take 2 tablets by mouth 2 (two) times daily.   Yes Historical Provider, MD  tiotropium (SPIRIVA) 18 MCG inhalation capsule Place 1 capsule (18 mcg total) into inhaler and inhale daily. 07/24/11  Yes Nimish Luther Parody, MD  cephALEXin (KEFLEX) 500 MG capsule Take 1 capsule (500 mg total) by mouth 4 (four) times daily. Patient not taking: Reported on 02/24/2014 12/22/13   Veryl Speak, MD  oxyCODONE-acetaminophen (PERCOCET) 10-325 MG per tablet Take 1 tablet by mouth every 4 (four) hours as needed for pain. Patient not taking: Reported on 02/24/2014 02/04/13   Nat Christen, MD  promethazine (PHENERGAN) 25 MG tablet Take 1 tablet (25 mg total) by mouth every 6 (six) hours as needed for nausea or vomiting. Patient not taking: Reported on 02/24/2014 02/04/13   Nat Christen, MD   Physical Exam: Filed Vitals:   02/24/14 1553 02/24/14 1600 02/24/14 1630 02/24/14 1700  BP:  111/64 96/64 108/60  Pulse:   129 128  Temp:      TempSrc:      Resp:  28  38  Height:      Weight:      SpO2: 96%  93% 93%    Wt Readings from Last 3 Encounters:  02/24/14 78.472 kg (173 lb)  12/22/13 78.926 kg (174 lb)  02/04/13 71.697 kg (158 lb 1 oz)    General:  Appears calm and comfortable Eyes: PERRL, normal lids, irises & conjunctiva ENT: grossly normal hearing, lips & tongue Neck: no LAD, masses or thyromegaly Cardiovascular: RRR, no m/r/g. No LE edema. Telemetry: SR, no arrhythmias  Respiratory: CTA bilaterally, no w/r/r. Normal respiratory effort. Abdomen: soft, ntnd Skin: no rash or induration seen on limited  exam Musculoskeletal: grossly normal tone BUE/BLE Psychiatric: grossly normal mood and affect, speech fluent and appropriate Neurologic: grossly non-focal.          Labs on Admission:  Basic Metabolic Panel:  Recent Labs Lab 02/24/14 1420  NA 140  K 3.4*  CL 108  CO2 25  GLUCOSE 92  BUN 10  CREATININE 0.90  CALCIUM 9.3   Liver Function Tests: No results for input(s): AST, ALT, ALKPHOS, BILITOT, PROT, ALBUMIN in the last 168 hours. No results for input(s): LIPASE, AMYLASE in the last 168 hours. No results for input(s): AMMONIA in the last 168 hours. CBC:  Recent Labs Lab 02/24/14 1420  WBC 8.7  NEUTROABS 6.6  HGB 14.9  HCT 43.7  MCV 93.2  PLT 363   Cardiac Enzymes: No results for input(s): CKTOTAL, CKMB, CKMBINDEX, TROPONINI in the last 168 hours.  BNP (last 3 results) No results  for input(s): PROBNP in the last 8760 hours. CBG: No results for input(s): GLUCAP in the last 168 hours.  Radiological Exams on Admission: Dg Chest 2 View  02/24/2014   CLINICAL DATA:  Shortness of breath, productive cough.  EXAM: CHEST  2 VIEW  COMPARISON:  Most recent exam 06/28/2013, multiple priors.  FINDINGS: The lungs are hyperinflated with emphysema. Previous large bulla in the left upper lung is no longer seen, new chain sutures in the left upper hemithorax, likely post left upper bullectomy. Postsurgical change also seen in the right upper hemithorax. There is no pneumothorax. No parenchymal consolidation. Heart size is normal. No pulmonary edema. The bones appear under mineralized but without acute abnormality.  IMPRESSION: Emphysema and postsurgical change.  No confluent airspace disease.   Electronically Signed   By: Jeb Levering M.D.   On: 02/24/2014 15:05    EKG: Independently reviewed. Sinus tach  Assessment/Plan Principal Problem:   COPD exacerbation Active Problems:   Acute-on-chronic respiratory failure   Tachycardia   Chronic pain   Bronchitis, acute, with  bronchospasm   1. COPD exacerbation. Likely precipitated by an acute bronchitis. Will admit the patient to telemetry unit. Start on intervenous steroids, continue bronchodilators and started on antibiotics. We'll continue with pulmonary hygiene. Continue supportive treatments, flutter valve. 2. Acute on chronic respiratory failure. Patient reports using oxygen during the evenings and nights. Treatment will be as above. Blood gas done shows reasonable PCO2. 3. Tachycardia, likely reactive to underlying pulmonary issues. Continue to follow. 4. Chronic pain. Continue home regimen. 5. Hypokalemia. Replace    Code Status: full code DVT Prophylaxis: lovenox Family Communication: discussed with patient Disposition Plan: discharge home once improved  Time spent: 64mins  MEMON,JEHANZEB [facility] Pager [phone]

## 2014-02-24 NOTE — ED Notes (Signed)
Pt has been doing albuterol breathing treatments every 3 hours since yesterday

## 2014-02-24 NOTE — ED Notes (Signed)
Pt complaining of pain, MD aware orders given. 

## 2014-02-24 NOTE — ED Notes (Signed)
Pt reports has been coughing until he vomits for the past 3 days and has been unable to keep down his medications.

## 2014-02-24 NOTE — ED Notes (Signed)
Pt started continuous breathing treatment

## 2014-02-24 NOTE — ED Provider Notes (Signed)
CSN: [id number]     Arrival date & time 02/24/14  1356 History  This chart was scribed for Richarda Blade, MD by Zola Button, ED Scribe. This patient was seen in room APA10/APA10 and the patient's care was started at 3:14 PM.      Chief Complaint  Patient presents with  . Shortness of Breath    The history is provided by the patient. No language interpreter was used.   HPI Comments: Wesley [name] is a 48 y.o. male with a hx of lung reduction surgery who presents to the Emergency Department complaining of SOB that began yesterday. Patient has been using the Pro-air treatments 3-4 hours but without relief. He also reports having cough, intermittent fever and vomiting that began 4 days ago. Patient has a hx of smoking, but has quit smoking. He states that his BP is always low.  Patient states that he has hx of 2 back surgeries and hip replacement. He states his BP is normally low. He sees Dr. Erie Noe at [facility] for his lung issues.   Past Medical History  Diagnosis Date  . COPD (chronic obstructive pulmonary disease)   . Osteoporosis   . Neuropathy   . Back pain   . Depression   . Avascular necrosis of femur    Past Surgical History  Procedure Laterality Date  . Joint replacement    . Back surgery    . Lung surgery     Family History  Problem Relation Age of Onset  . Heart failure Father   . Diabetes Father   . Diabetes Mother   . Cancer Mother     male cancer   History  Substance Use Topics  . Smoking status: Current Every Day Smoker -- 1.00 packs/day    Last Attempt to Quit: 07/05/2011  . Smokeless tobacco: Not on file  . Alcohol Use: No    Review of Systems  Constitutional: Positive for fever.  Respiratory: Positive for cough and shortness of breath.   Gastrointestinal: Positive for vomiting.  Musculoskeletal: Positive for back pain and arthralgias.  All other systems reviewed and are negative.     Allergies  Ambien  Home Medications   Prior to  Admission medications   Medication Sig Start Date End Date Taking? Authorizing Provider  albuterol (PROVENTIL HFA;VENTOLIN HFA) 108 (90 BASE) MCG/ACT inhaler Inhale 2 puffs into the lungs every 4 (four) hours as needed for wheezing or shortness of breath. Shortness of breath 01/06/12  Yes Delfina Redwood, MD  albuterol (PROVENTIL) (2.5 MG/3ML) 0.083% nebulizer solution Take 2.5 mg by nebulization every 6 (six) hours as needed for wheezing or shortness of breath.    Yes Historical Provider, MD  amoxicillin-clavulanate (AUGMENTIN) 875-125 MG per tablet Take 1 tablet by mouth 2 (two) times daily.   Yes Historical Provider, MD  budesonide-formoterol (SYMBICORT) 160-4.5 MCG/ACT inhaler Inhale 3 puffs into the lungs 4 (four) times daily.   Yes Historical Provider, MD  Fluticasone-Salmeterol (ADVAIR) 250-50 MCG/DOSE AEPB Inhale 1 puff into the lungs every 12 (twelve) hours.   Yes Historical Provider, MD  furosemide (LASIX) 40 MG tablet Take 40 mg by mouth daily.   Yes Historical Provider, MD  gabapentin (NEURONTIN) 300 MG capsule Take 300 mg by mouth every 8 (eight) hours.   Yes Historical Provider, MD  morphine (MS CONTIN) 30 MG 12 hr tablet Take 30 mg by mouth 3 (three) times daily. 01/05/14  Yes Historical Provider, MD  Naloxone HCl (EVZIO) 0.4 MG/0.4ML SOAJ Inject  as directed once as needed (EVZIO is an opioid antagonist indicated for the emergency treatment of known or suspected opioid overdose, as manifested by respiratory and/or central nervous system depression. EVZIO is intended for immediate administration as emergency therapy in settings where opioids may be present. EVZIO is not a substitute for emergency medical care.).   Yes Historical Provider, MD  nicotine (NICODERM CQ - DOSED IN MG/24 HOURS) 14 mg/24hr patch Place 14 mg onto the skin daily.   Yes Historical Provider, MD  oxyCODONE (ROXICODONE) 15 MG immediate release tablet Take 30 mg by mouth every 4 (four) hours as needed for pain.  01/05/14   Yes Historical Provider, MD  Polyethyl Glycol-Propyl Glycol (SYSTANE) 0.4-0.3 % SOLN Place 1-2 drops into the left eye daily as needed (for dry eye relief).    Yes Historical Provider, MD  potassium chloride SA (K-DUR,KLOR-CON) 20 MEQ tablet Take 20 mEq by mouth daily.   Yes Historical Provider, MD  senna (SENOKOT) 8.6 MG tablet Take 2 tablets by mouth 2 (two) times daily.   Yes Historical Provider, MD  tiotropium (SPIRIVA) 18 MCG inhalation capsule Place 1 capsule (18 mcg total) into inhaler and inhale daily. 07/24/11  Yes Nimish Luther Parody, MD  cephALEXin (KEFLEX) 500 MG capsule Take 1 capsule (500 mg total) by mouth 4 (four) times daily. Patient not taking: Reported on 02/24/2014 12/22/13   Veryl Speak, MD  oxyCODONE-acetaminophen (PERCOCET) 10-325 MG per tablet Take 1 tablet by mouth every 4 (four) hours as needed for pain. Patient not taking: Reported on 02/24/2014 02/04/13   Nat Christen, MD  promethazine (PHENERGAN) 25 MG tablet Take 1 tablet (25 mg total) by mouth every 6 (six) hours as needed for nausea or vomiting. Patient not taking: Reported on 02/24/2014 02/04/13   Nat Christen, MD   BP 96/64 mmHg  Pulse 129  Temp(Src) 98.6 F (37 C) (Oral)  Resp 28  Ht 6\' 1"  (1.854 m)  Wt 173 lb (78.472 kg)  BMI 22.83 kg/m2  SpO2 93% Physical Exam  Constitutional: He is oriented to person, place, and time. He appears well-developed and well-nourished.  HENT:  Head: Normocephalic and atraumatic.  Right Ear: External ear normal.  Left Ear: External ear normal.  Eyes: Conjunctivae and EOM are normal. Pupils are equal, round, and reactive to light.  Neck: Normal range of motion and phonation normal. Neck supple.  Cardiovascular: Normal rate, regular rhythm and normal heart sounds.   Pulmonary/Chest: Effort normal. He has decreased breath sounds. He has wheezes. He exhibits no bony tenderness.  Inspiratory and expiratory wheezes. Decreased breath sounds bilaterally.  Increased work of breathing, he  is sitting in tripod position  Abdominal: Soft. There is tenderness.  Mild diffuse abdominal tenderness.  Musculoskeletal: Normal range of motion.  Neurological: He is alert and oriented to person, place, and time. No cranial nerve deficit or sensory deficit. He exhibits normal muscle tone. Coordination normal.  Skin: Skin is warm, dry and intact.  Psychiatric: He has a normal mood and affect. His behavior is normal. Judgment and thought content normal.  Nursing note and vitals reviewed.   ED Course  Procedures  DIAGNOSTIC STUDIES: Oxygen Saturation is 93% on room air, adequate by my interpretation.    COORDINATION OF CARE: 3:17 PM-Discussed treatment plan which includes CXR, labs and medications with pt at bedside and pt agreed to plan.   Medications  levofloxacin (LEVAQUIN) IVPB 500 mg (500 mg Intravenous New Bag/Given 02/24/14 1707)  ondansetron (ZOFRAN) injection 4 mg (4 mg  Intravenous Given 02/24/14 1418)  sodium chloride 0.9 % bolus 500 mL (0 mLs Intravenous Stopped 02/24/14 1630)  fentaNYL (SUBLIMAZE) injection 100 mcg (100 mcg Intravenous Given 02/24/14 1539)  ondansetron (ZOFRAN) injection 4 mg (0 mg Intravenous Duplicate 23/55/73 2202)  albuterol (PROVENTIL,VENTOLIN) solution continuous neb (20 mg/hr Nebulization Given 02/24/14 1543)  methylPREDNISolone sodium succinate (SOLU-MEDROL) 125 mg/2 mL injection 125 mg (125 mg Intravenous Given 02/24/14 1542)  guaiFENesin (ROBITUSSIN) 100 MG/5ML solution 100 mg (100 mg Oral Given 02/24/14 1633)  HYDROmorphone (DILAUDID) injection 1 mg (1 mg Intravenous Given 02/24/14 1629)  0.9 %  sodium chloride infusion (500 mL/hr Intravenous New Bag/Given 02/24/14 1700)    Patient Vitals for the past 24 hrs:  BP Temp Temp src Pulse Resp SpO2 Height Weight  02/24/14 1630 96/64 mmHg - - (!) 129 - 93 % - -  02/24/14 1600 111/64 mmHg - - - (!) 28 - - -  02/24/14 1553 - - - - - 96 % - -  02/24/14 1530 (!) 102/50 mmHg - - 97 - 95 % - -  02/24/14  1500 98/73 mmHg - - 113 - 93 % - -  02/24/14 1430 96/60 mmHg - - 97 10 93 % - -  02/24/14 1405 100/69 mmHg 98.6 F (37 C) Oral 110 (!) 28 95 % 6\' 1"  (1.854 m) 173 lb (78.472 kg)    5:01 PM Reevaluation with update and discussion. After initial assessment and treatment, an updated evaluation reveals after continuous nebulizer, patient is still uncomfortable with tachypnea, tachycardia and a sensation of shortness of breath.  His work of breathing, is improved, he is lying semi-recumbent and tolerating meds, with normal oxygen saturation.  Lungs have decreased air movement, with generalized wheezing, at this time.  Findings discussed with the patient, he agrees to hospitalization for further treatment.Daleen Bo L   5:07 PM-Consult complete with hospitalist. Patient case explained and discussed. Dr, Roderic Palau agrees to admit patient for further evaluation and treatment. Call ended at 17:30    CRITICAL CARE Performed by: Richarda Blade Total critical care time: 45 minutes Critical care time was exclusive of separately billable procedures and treating other patients. Critical care was necessary to treat or prevent imminent or life-threatening deterioration. Critical care was time spent personally by me on the following activities: development of treatment plan with patient and/or surrogate as well as nursing, discussions with consultants, evaluation of patient's response to treatment, examination of patient, obtaining history from patient or surrogate, ordering and performing treatments and interventions, ordering and review of laboratory studies, ordering and review of radiographic studies, pulse oximetry and re-evaluation of patient's condition.    Results for orders placed or performed during the hospital encounter of 54/27/06  Basic metabolic panel  Result Value Ref Range   Sodium 140 135 - 145 mmol/L   Potassium 3.4 (L) 3.5 - 5.1 mmol/L   Chloride 108 96 - 112 mEq/L   CO2 25 19 - 32  mmol/L   Glucose, Bld 92 70 - 99 mg/dL   BUN 10 6 - 23 mg/dL   Creatinine, Ser 0.90 0.50 - 1.35 mg/dL   Calcium 9.3 8.4 - 10.5 mg/dL   GFR calc non Af Amer >90 >90 mL/min   GFR calc Af Amer >90 >90 mL/min   Anion gap 7 5 - 15  CBC with Differential  Result Value Ref Range   WBC 8.7 4.0 - 10.5 K/uL   RBC 4.69 4.22 - 5.81 MIL/uL   Hemoglobin 14.9 13.0 - 17.0  g/dL   HCT 43.7 39.0 - 52.0 %   MCV 93.2 78.0 - 100.0 fL   MCH 31.8 26.0 - 34.0 pg   MCHC 34.1 30.0 - 36.0 g/dL   RDW 12.2 11.5 - 15.5 %   Platelets 363 150 - 400 K/uL   Neutrophils Relative % 75 43 - 77 %   Neutro Abs 6.6 1.7 - 7.7 K/uL   Lymphocytes Relative 11 (L) 12 - 46 %   Lymphs Abs 1.0 0.7 - 4.0 K/uL   Monocytes Relative 12 3 - 12 %   Monocytes Absolute 1.0 0.1 - 1.0 K/uL   Eosinophils Relative 1 0 - 5 %   Eosinophils Absolute 0.0 0.0 - 0.7 K/uL   Basophils Relative 1 0 - 1 %   Basophils Absolute 0.1 0.0 - 0.1 K/uL  Blood gas, arterial  Result Value Ref Range   pH, Arterial 7.409 7.350 - 7.450   pCO2 arterial 39.5 35.0 - 45.0 mmHg   pO2, Arterial 79.9 (L) 80.0 - 100.0 mmHg   Bicarbonate 24.5 (H) 20.0 - 24.0 mEq/L   TCO2 21.5 0 - 100 mmol/L   Acid-Base Excess 0.4 0.0 - 2.0 mmol/L   O2 Saturation 96.1 %   Patient temperature 37.0    Collection site LEFT RADIAL    Drawn by [id number]    Sample type ARTERIAL    Allens test (pass/fail) PASS PASS     Imaging Review Dg Chest 2 View  02/24/2014   CLINICAL DATA:  Shortness of breath, productive cough.  EXAM: CHEST  2 VIEW  COMPARISON:  Most recent exam 06/28/2013, multiple priors.  FINDINGS: The lungs are hyperinflated with emphysema. Previous large bulla in the left upper lung is no longer seen, new chain sutures in the left upper hemithorax, likely post left upper bullectomy. Postsurgical change also seen in the right upper hemithorax. There is no pneumothorax. No parenchymal consolidation. Heart size is normal. No pulmonary edema. The bones appear under mineralized but  without acute abnormality.  IMPRESSION: Emphysema and postsurgical change.  No confluent airspace disease.   Electronically Signed   By: Jeb Levering M.D.   On: 02/24/2014 15:05     EKG Interpretation None      MDM   Final diagnoses:  SOB (shortness of breath)  COPD exacerbation  Hypotension, unspecified hypotension type    Bronchitis without evidence for pneumonia.  Chronic lung disease associated, cause unknown, and status post partial pneumonectomy. He is a former smoker. He has chronic LBP.  Blood pressure soft today, patient states that he has chronically low blood pressure.  Work of breathing improved but still symptomatic after 20 mg albuterol nebulizer treatment. Doubt sepsis, metabolic instability or impending vascular collapse.   Nursing Notes Reviewed/ Care Coordinated, and agree without changes. Applicable Imaging Reviewed.  Interpretation of Laboratory Data incorporated into ED treatment  Plan: Admit  I personally performed the services described in this documentation, which was scribed in my presence. The recorded information has been reviewed and is accurate.     Richarda Blade, MD 02/24/14 [phone]

## 2014-02-24 NOTE — ED Notes (Signed)
Pt has not been able to keep all his home meds down for the last 3 days. Pain is much worse

## 2014-02-24 NOTE — ED Notes (Signed)
Pt c/o cough/congestion since Friday.  Reports started feeling SOB yesterday.  Has  Been using home o2 and nebs prn but says is no better.  Reports has had parts of both lungs removed.  First surgery was  in nov 2014 and 2nd surgery was July 2015.

## 2014-02-25 DIAGNOSIS — J9620 Acute and chronic respiratory failure, unspecified whether with hypoxia or hypercapnia: Secondary | ICD-10-CM

## 2014-02-25 LAB — BASIC METABOLIC PANEL
Anion gap: 8 (ref 5–15)
BUN: 7 mg/dL (ref 6–23)
CALCIUM: 9.5 mg/dL (ref 8.4–10.5)
CHLORIDE: 102 meq/L (ref 96–112)
CO2: 27 mmol/L (ref 19–32)
Creatinine, Ser: 0.82 mg/dL (ref 0.50–1.35)
GFR calc Af Amer: >90 mL/min (ref >90)
GFR calc non Af Amer: >90 mL/min (ref >90)
GLUCOSE: 165 mg/dL — AB (ref 70–99)
Potassium: 4 mmol/L (ref 3.5–5.1)
SODIUM: 137 mmol/L (ref 135–145)

## 2014-02-25 LAB — CBC
HCT: 41 % (ref 39.0–52.0)
Hemoglobin: 13.7 g/dL (ref 13.0–17.0)
MCH: 31.5 pg (ref 26.0–34.0)
MCHC: 33.4 g/dL (ref 30.0–36.0)
MCV: 94.3 fL (ref 78.0–100.0)
Platelets: 322 10*3/uL (ref 150–400)
RBC: 4.35 MIL/uL (ref 4.22–5.81)
RDW: 12.2 % (ref 11.5–15.5)
WBC: 7.4 10*3/uL (ref 4.0–10.5)

## 2014-02-25 LAB — INFLUENZA PANEL BY PCR (TYPE A & B)
H1N1 flu by pcr: NOT DETECTED
Influenza A By PCR: NEGATIVE
Influenza B By PCR: NEGATIVE

## 2014-02-25 MED ORDER — METHOCARBAMOL 500 MG PO TABS
500.0000 mg | ORAL_TABLET | Freq: Four times a day (QID) | ORAL | Status: DC | PRN
  Administered 2014-02-25 – 2014-02-27 (×5): 500 mg via ORAL
  Filled 2014-02-25 (×5): qty 1

## 2014-02-25 MED ORDER — HYDROMORPHONE HCL 1 MG/ML IJ SOLN
1.0000 mg | Freq: Once | INTRAMUSCULAR | Status: AC
  Administered 2014-02-25: 1 mg via INTRAVENOUS
  Filled 2014-02-25: qty 1

## 2014-02-25 MED ORDER — INFLUENZA VAC SPLIT QUAD 0.5 ML IM SUSY
0.5000 mL | PREFILLED_SYRINGE | INTRAMUSCULAR | Status: DC
  Filled 2014-02-25: qty 0.5

## 2014-02-25 MED ORDER — POLYVINYL ALCOHOL 1.4 % OP SOLN: 1.0000 [drp] | Freq: Every day | OPHTHALMIC | Status: DC | PRN

## 2014-02-25 NOTE — Plan of Care (Signed)
Problem: Phase I Progression Outcomes Goal: Pt OOB to Walk or Exercise Daily With Nursing or PT Patient OOB to walk or exercise daily with nursing or PT if activity order permits  Outcome: Completed/Met Date Met:  02/25/14 Pt ambulated in hallway today approximately 75 feet. Tolerated well. No complaints. Dr. Roderic Palau made aware.

## 2014-02-25 NOTE — Progress Notes (Signed)
[facility] PROGRESS NOTE  Wesley [name] YHC:[id number] DOB: 1965-06-13 DOA: 02/24/2014 PCP: [name], MD  Assessment/Plan: 1. COPD exacerbation. Patient continues to wheeze and short of breath. Continue bronchodilators, steroids and antibiotics. Continue pulmonary hygiene. 2. Acute on chronic respiratory failure. Continue to wean down oxygen as tolerated. 3. Tachycardia. Reactive to underlying pulmonary issues  4. Chronic pain. Continued on home regimen 5. Hypokalemia. Replace  Code Status: Full code Family Communication: Discussed with patient Disposition Plan: Discharge home once improved   Consultants:    Procedures:    Antibiotics:  Levofloxacin 12/22  HPI/Subjective: Still feels short of breath. Complains of ongoing coughing. Has chronic pain issues  Objective: Filed Vitals:   02/25/14 1219  BP: 102/22  Pulse: 113  Temp: 98.3 F (36.8 C)  Resp: 18    Intake/Output Summary (Last 24 hours) at 02/25/14 1953 Last data filed at 02/25/14 1711  Gross per 24 hour  Intake   1044 ml  Output   3500 ml  Net  -2456 ml   Filed Weights   02/24/14 1405  Weight: 78.472 kg (173 lb)    Exam:   General:  No acute distress  Cardiovascular: S1, S2, regular rate and rhythm  Respiratory: Diminished breath sounds with wheezing  Abdomen: Soft, nontender, positive bowel sounds  Musculoskeletal: No lower extremity edema bilaterally  Data Reviewed: Basic Metabolic Panel:  Recent Labs Lab 02/24/14 1420 02/25/14 0630  NA 140 137  K 3.4* 4.0  CL 108 102  CO2 25 27  GLUCOSE 92 165*  BUN 10 7  CREATININE 0.90 0.82  CALCIUM 9.3 9.5   Liver Function Tests: No results for input(s): AST, ALT, ALKPHOS, BILITOT, PROT, ALBUMIN in the last 168 hours. No results for input(s): LIPASE, AMYLASE in the last 168 hours. No results for input(s): AMMONIA in the last 168 hours. CBC:  Recent Labs Lab 02/24/14 1420 02/25/14 0630  WBC 8.7 7.4  NEUTROABS 6.6   --   HGB 14.9 13.7  HCT 43.7 41.0  MCV 93.2 94.3  PLT 363 322   Cardiac Enzymes: No results for input(s): CKTOTAL, CKMB, CKMBINDEX, TROPONINI in the last 168 hours. BNP (last 3 results) No results for input(s): PROBNP in the last 8760 hours. CBG: No results for input(s): GLUCAP in the last 168 hours.  Recent Results (from the past 240 hour(s))  Culture, blood (routine x 2)     Status: None (Preliminary result)   Collection Time: 02/24/14  5:18 PM  Result Value Ref Range Status   Specimen Description BLOOD LEFT ANTECUBITAL  Final   Special Requests   Final    BOTTLES DRAWN AEROBIC AND ANAEROBIC AEB=10CC ANA=6CC   Culture NO GROWTH 1 DAY  Final   Report Status PENDING  Incomplete  Culture, blood (routine x 2)     Status: None (Preliminary result)   Collection Time: 02/24/14  5:22 PM  Result Value Ref Range Status   Specimen Description BLOOD RIGHT ANTECUBITAL  Final   Special Requests   Final    BOTTLES DRAWN AEROBIC AND ANAEROBIC AEB=10CC ANA=6CC   Culture NO GROWTH 1 DAY  Final   Report Status PENDING  Incomplete     Studies: Dg Chest 2 View  02/24/2014   CLINICAL DATA:  Shortness of breath, productive cough.  EXAM: CHEST  2 VIEW  COMPARISON:  Most recent exam 06/28/2013, multiple priors.  FINDINGS: The lungs are hyperinflated with emphysema. Previous large bulla in the left upper lung is no longer seen, new  chain sutures in the left upper hemithorax, likely post left upper bullectomy. Postsurgical change also seen in the right upper hemithorax. There is no pneumothorax. No parenchymal consolidation. Heart size is normal. No pulmonary edema. The bones appear under mineralized but without acute abnormality.  IMPRESSION: Emphysema and postsurgical change.  No confluent airspace disease.   Electronically Signed   By: Jeb Levering M.D.   On: 02/24/2014 15:05    Scheduled Meds: . budesonide-formoterol  3 puff Inhalation QID  . enoxaparin (LOVENOX) injection  40 mg Subcutaneous  Q24H  . furosemide  40 mg Oral Daily  . gabapentin  300 mg Oral 3 times per day  . guaiFENesin  1,200 mg Oral BID  . [START ON 02/26/2014] Influenza vac split quadrivalent PF  0.5 mL Intramuscular Tomorrow-1000  . levalbuterol  0.63 mg Nebulization Q4H  . levofloxacin (LEVAQUIN) IV  500 mg Intravenous Q24H  . methylPREDNISolone (SOLU-MEDROL) injection  60 mg Intravenous Q6H  . morphine  30 mg Oral TID  . nicotine  14 mg Transdermal Q24H  . potassium chloride SA  20 mEq Oral Daily  . senna  2 tablet Oral BID  . sodium chloride  3 mL Intravenous Q12H  . tiotropium  18 mcg Inhalation Daily   Continuous Infusions:   Principal Problem:   COPD exacerbation Active Problems:   Acute-on-chronic respiratory failure   Tachycardia   Chronic pain   Bronchitis, acute, with bronchospasm    Time spent: 64mins    MEMON,JEHANZEB  [facility] Pager [phone]. If 7PM-7AM, please contact night-coverage at [email or web address], password [facility] 02/25/2014, 7:53 PM  LOS: 1 day

## 2014-02-25 NOTE — Care Management Note (Addendum)
    Page 1 of 1   02/26/2014     1:56:52 PM CARE MANAGEMENT NOTE 02/26/2014  Patient:  MAXEMILIANO, RIEL   Account Number:  [id number]  Date Initiated:  02/25/2014  Documentation initiated by:  Theophilus Kinds  Subjective/Objective Assessment:   Pt admitted from home with COPD. Pt lives with his wife and will return home at discharge. Pt is independent with ADL's. Pt has home O2 with [employer].     Action/Plan:   No Cm needs noted. Pt requested help with medications at discharge, since part D coverage does not start until Jan 1. Cm explained to pt that no assistance could be offered at this time.   Anticipated DC Date:  02/27/2014   Anticipated DC Plan:  [place]  CM consult      [facility] Choice  HOME HEALTH   Choice offered to / List presented to:  C-1 Patient        [place] arranged  HH-1 RN  [place].   Status of service:  Completed, signed off Medicare Important Message given?  YES (If response is "NO", the following Medicare IM given date fields will be blank) Date Medicare IM given:  02/26/2014 Medicare IM given by:  Theophilus Kinds Date Additional Medicare IM given:   Additional Medicare IM given by:    Discharge Disposition:  [place]  Per UR Regulation:    If discussed at Long Length of Stay Meetings, dates discussed:    Comments:  02/26/14 [place], RN BSN CM Pt potential discharge on 02/27/14. Pt agreeable to [facility] RN with [facility]. Weekend staff to arrange at discharge.  02/25/14 [place], RN BSN CM

## 2014-02-25 NOTE — Progress Notes (Signed)
UR chart review completed.  

## 2014-02-26 MED ORDER — HYDROCODONE-HOMATROPINE 5-1.5 MG/5ML PO SYRP
5.0000 mL | ORAL_SOLUTION | ORAL | Status: DC | PRN
  Administered 2014-02-26 – 2014-02-27 (×4): 5 mL via ORAL
  Filled 2014-02-26 (×4): qty 5

## 2014-02-26 MED ORDER — HYDROMORPHONE HCL 1 MG/ML IJ SOLN
1.0000 mg | Freq: Once | INTRAMUSCULAR | Status: AC
  Administered 2014-02-26: 1 mg via INTRAVENOUS
  Filled 2014-02-26: qty 1

## 2014-02-26 MED ORDER — POLYETHYLENE GLYCOL 3350 17 G PO PACK
17.0000 g | PACK | Freq: Every day | ORAL | Status: DC
  Administered 2014-02-26 – 2014-02-27 (×2): 17 g via ORAL
  Filled 2014-02-26 (×2): qty 1

## 2014-02-26 MED ORDER — INFLUENZA VAC SPLIT QUAD 0.5 ML IM SUSY
0.5000 mL | PREFILLED_SYRINGE | INTRAMUSCULAR | Status: AC
  Administered 2014-02-27: 0.5 mL via INTRAMUSCULAR
  Filled 2014-02-26: qty 0.5

## 2014-02-26 MED ORDER — INFLUENZA VAC SPLIT QUAD 0.5 ML IM SUSY
0.5000 mL | PREFILLED_SYRINGE | Freq: Once | INTRAMUSCULAR | Status: DC
Start: 2014-02-26 — End: 2014-02-26
  Filled 2014-02-26: qty 0.5

## 2014-02-26 MED ORDER — LEVOFLOXACIN 500 MG PO TABS
500.0000 mg | ORAL_TABLET | Freq: Every day | ORAL | Status: DC
  Administered 2014-02-26 – 2014-02-27 (×2): 500 mg via ORAL
  Filled 2014-02-26 (×2): qty 1

## 2014-02-26 MED ORDER — KETOROLAC TROMETHAMINE 30 MG/ML IJ SOLN
30.0000 mg | Freq: Four times a day (QID) | INTRAMUSCULAR | Status: DC
  Administered 2014-02-26 – 2014-02-27 (×5): 30 mg via INTRAVENOUS
  Filled 2014-02-26 (×5): qty 1

## 2014-02-26 NOTE — Progress Notes (Signed)
[facility] PROGRESS NOTE  Wesley [name] [id number]:[id number] DOB: [birth year] DOA: 02/24/2014 PCP: [name], MD  Assessment/Plan: 1. COPD exacerbation. Patient appears to be slowly improving. Continue bronchodilators, steroids and antibiotics. Continue pulmonary hygiene. 2. Acute on chronic respiratory failure. Continue to wean down oxygen as tolerated. 3. Tachycardia. Reactive to underlying pulmonary issues  4. Chronic pain. Continued on home regimen 5. Hypokalemia. corrected  Code Status: Full code Family Communication: Discussed with patient Disposition Plan: Discharge home once improved   Consultants:    Procedures:    Antibiotics:  Levofloxacin 12/22  HPI/Subjective: Feels that breathing is slowly improving. Still has significant cough and pain in ribs with cough  Objective: Filed Vitals:   02/26/14 1510  BP: 103/61  Pulse: 104  Temp: 98.1 F (36.7 C)  Resp: 18    Intake/Output Summary (Last 24 hours) at 02/26/14 1921 Last data filed at 02/26/14 1735  Gross per 24 hour  Intake   1160 ml  Output   2225 ml  Net  -1065 ml   Filed Weights   02/24/14 1405  Weight: 78.472 kg (173 lb)    Exam:   General:  No acute distress  Cardiovascular: S1, S2, regular rate and rhythm  Respiratory: wheezing still present, but appears to be improving  Abdomen: Soft, nontender, positive bowel sounds  Musculoskeletal: No lower extremity edema bilaterally  Data Reviewed: Basic Metabolic Panel:  Recent Labs Lab 02/24/14 1420 02/25/14 0630  NA 140 137  K 3.4* 4.0  CL 108 102  CO2 25 27  GLUCOSE 92 165*  BUN 10 7  CREATININE 0.90 0.82  CALCIUM 9.3 9.5   Liver Function Tests: No results for input(s): AST, ALT, ALKPHOS, BILITOT, PROT, ALBUMIN in the last 168 hours. No results for input(s): LIPASE, AMYLASE in the last 168 hours. No results for input(s): AMMONIA in the last 168 hours. CBC:  Recent Labs Lab 02/24/14 1420 02/25/14 0630  WBC 8.7  7.4  NEUTROABS 6.6  --   HGB 14.9 13.7  HCT 43.7 41.0  MCV 93.2 94.3  PLT 363 322   Cardiac Enzymes: No results for input(s): CKTOTAL, CKMB, CKMBINDEX, TROPONINI in the last 168 hours. BNP (last 3 results) No results for input(s): PROBNP in the last 8760 hours. CBG: No results for input(s): GLUCAP in the last 168 hours.  Recent Results (from the past 240 hour(s))  Culture, blood (routine x 2)     Status: None (Preliminary result)   Collection Time: 02/24/14  5:18 PM  Result Value Ref Range Status   Specimen Description BLOOD LEFT ANTECUBITAL  Final   Special Requests   Final    BOTTLES DRAWN AEROBIC AND ANAEROBIC AEB=10CC ANA=6CC   Culture NO GROWTH 2 DAYS  Final   Report Status PENDING  Incomplete  Culture, blood (routine x 2)     Status: None (Preliminary result)   Collection Time: 02/24/14  5:22 PM  Result Value Ref Range Status   Specimen Description BLOOD RIGHT ANTECUBITAL  Final   Special Requests   Final    BOTTLES DRAWN AEROBIC AND ANAEROBIC AEB=10CC ANA=6CC   Culture NO GROWTH 2 DAYS  Final   Report Status PENDING  Incomplete     Studies: No results found.  Scheduled Meds: . budesonide-formoterol  3 puff Inhalation QID  . enoxaparin (LOVENOX) injection  40 mg Subcutaneous Q24H  . gabapentin  300 mg Oral 3 times per day  . guaiFENesin  1,200 mg Oral BID  . [START ON 02/27/2014] Influenza  vac split quadrivalent PF  0.5 mL Intramuscular Tomorrow-1000  . ketorolac  30 mg Intravenous 4 times per day  . levalbuterol  0.63 mg Nebulization Q4H  . levofloxacin  500 mg Oral q1800  . methylPREDNISolone (SOLU-MEDROL) injection  60 mg Intravenous Q6H  . morphine  30 mg Oral TID  . nicotine  14 mg Transdermal Q24H  . polyethylene glycol  17 g Oral Daily  . potassium chloride SA  20 mEq Oral Daily  . senna  2 tablet Oral BID  . sodium chloride  3 mL Intravenous Q12H  . tiotropium  18 mcg Inhalation Daily   Continuous Infusions:   Principal Problem:   COPD  exacerbation Active Problems:   Acute-on-chronic respiratory failure   Tachycardia   Chronic pain   Bronchitis, acute, with bronchospasm    Time spent: 1mins    MEMON,JEHANZEB  [facility] Pager [phone]. If 7PM-7AM, please contact night-coverage at [email or web address], password [facility] 02/26/2014, 7:21 PM  LOS: 2 days

## 2014-02-26 NOTE — Progress Notes (Signed)
Pt has ambulated twice in the hallway today. Both times he ambulated approximately 100 feet.  Tolerated fair.  Pt encouraged to ask for assistance when ambulating d/t weakness and unsteady gait. Pt agreed. Pt in bed at this time with bed alarm on.

## 2014-02-26 NOTE — Progress Notes (Signed)
PHARMACIST - PHYSICIAN COMMUNICATION DR:   Memon CONCERNING: Antibiotic IV to Oral Route Change Policy  RECOMMENDATION: This patient is receiving Levaquin by the intravenous route.  Based on criteria approved by the Pharmacy and Therapeutics Committee, the antibiotic(s) is/are being converted to the equivalent oral dose form(s).   DESCRIPTION: These criteria include:  Patient being treated for a respiratory tract infection, urinary tract infection, cellulitis or clostridium difficile associated diarrhea if on metronidazole  The patient is not neutropenic and does not exhibit a GI malabsorption state  The patient is eating (either orally or via tube) and/or has been taking other orally administered medications for a least 24 hours  The patient is improving clinically and has a Tmax < 100.5  If you have questions about this conversion, please contact the Pharmacy Department  [x]  ( [phone] )  [facility] []  ( [phone]  [place]  []  ( [phone]  [facility] []  ( [phone] )  [facility]    S. Rheannon Cerney, PharmD 

## 2014-02-27 DIAGNOSIS — J441 Chronic obstructive pulmonary disease with (acute) exacerbation: Secondary | ICD-10-CM | POA: Diagnosis not present

## 2014-02-27 LAB — GLUCOSE, CAPILLARY: Glucose-Capillary: 167 mg/dL — ABNORMAL HIGH (ref 70–99)

## 2014-02-27 MED ORDER — LEVOFLOXACIN 500 MG PO TABS: 500.0000 mg | ORAL_TABLET | Freq: Every day | ORAL | Status: DC

## 2014-02-27 MED ORDER — ALUM & MAG HYDROXIDE-SIMETH 200-200-20 MG/5ML PO SUSP
30.0000 mL | Freq: Once | ORAL | Status: AC
  Administered 2014-02-27: 30 mL via ORAL
  Filled 2014-02-27: qty 30

## 2014-02-27 MED ORDER — ALBUTEROL SULFATE (2.5 MG/3ML) 0.083% IN NEBU: 2.5000 mg | INHALATION_SOLUTION | Freq: Four times a day (QID) | RESPIRATORY_TRACT | Status: DC | PRN

## 2014-02-27 MED ORDER — ALUM & MAG HYDROXIDE-SIMETH 200-200-20 MG/5ML PO SUSP
30.0000 mL | ORAL | Status: DC | PRN
  Administered 2014-02-27: 30 mL via ORAL
  Filled 2014-02-27: qty 30

## 2014-02-27 MED ORDER — PREDNISONE 10 MG PO TABS: ORAL_TABLET | ORAL | Status: DC

## 2014-02-27 MED ORDER — HYDROCODONE-HOMATROPINE 5-1.5 MG/5ML PO SYRP: 5.0000 mL | ORAL_SOLUTION | ORAL | Status: DC | PRN

## 2014-02-27 NOTE — Discharge Summary (Signed)
Physician Discharge Summary  JOZEF EISENBEIS ZJQ:[id number] DOB: 04/07/1965 DOA: 02/24/2014  PCP: [name], MD  Admit date: 02/24/2014 Discharge date: 02/27/2014  Time spent: 40 minutes  Recommendations for Outpatient Follow-up:  1. Follow up with primary care physician in 1-2 weeks  Discharge Diagnoses:  Principal Problem:   COPD exacerbation Active Problems:   Acute-on-chronic respiratory failure   Tachycardia   Chronic pain   Bronchitis, acute, with bronchospasm   Discharge Condition: improved  Diet recommendation: low salt  Filed Weights   02/24/14 1405  Weight: 78.472 kg (173 lb)    History of present illness:  This patient was admitted to the hospital with progressive shortness of breath. He has known severe COPD status post lung reduction surgery at [facility] in the past. Patient had upper respiratory tract infection symptoms for several days prior to admission. He was noted to be tachypneic and tachycardic in the emergency room. He was admitted to the hospital for further management.  Hospital Course:  Patient was treated with intravenous steroids, bronchodilators and antibiotics. With supportive treatment, his respiratory status is now approaching baseline. He still has some mild wheezing, but is able to ambulate on room air without difficulty and able to carry on a conversation without difficulty. He has been transitioned to prednisone taper. He already has albuterol nebulizers at home. He'll be given a course of antibiotics completed home. He plans to follow-up with his pulmonologist at [facility].  The patient also has chronic pain. He is followed at the pain clinic at [facility].  Procedures:    Consultations:    Discharge Exam: Filed Vitals:   02/27/14 1544  BP: 134/69  Pulse: 105  Temp: 97.9 F (36.6 C)  Resp: 21    General: NAD Cardiovascular: s1, S2 RRR Respiratory: mild expiratory wheezing  Discharge  Instructions   Discharge Instructions    Call MD for:  difficulty breathing, headache or visual disturbances    Complete by:  As directed      Call MD for:  temperature >100.4    Complete by:  As directed      Diet - low sodium heart healthy    Complete by:  As directed      Face-to-face encounter (required for Medicare/Medicaid patients)    Complete by:  As directed   I MEMON,JEHANZEB certify that this patient is under my care and that I, or a nurse practitioner or physician's assistant working with me, had a face-to-face encounter that meets the physician face-to-face encounter requirements with this patient on 02/27/2014. The encounter with the patient was in whole, or in part for the following medical condition(s) which is the primary reason for home health care (List medical condition): patient with severe copd, would benefit from home health RN for disease management  The encounter with the patient was in whole, or in part, for the following medical condition, which is the primary reason for home health care:  copd exacerbation  I certify that, based on my findings, the following services are medically necessary home health services:  Nursing  Reason for Medically Necessary Home Health Services:  Skilled Nursing- Teaching of Disease Process/Symptom Management  My clinical findings support the need for the above services:  Shortness of breath with activity  Further, I certify that my clinical findings support that this patient is homebound due to:  Shortness of Breath with activity     Home Health    Complete by:  As directed   To provide  the following care/treatments:  RN     Increase activity slowly    Complete by:  As directed           Discharge Medication List as of 02/27/2014  5:38 PM    START taking these medications   Details  HYDROcodone-homatropine (HYCODAN) 5-1.5 MG/5ML syrup Take 5 mLs by mouth every 4 (four) hours as needed for cough., Starting 02/27/2014, Until  Discontinued, Print    levofloxacin (LEVAQUIN) 500 MG tablet Take 1 tablet (500 mg total) by mouth daily at 6 PM., Starting 02/27/2014, Until Discontinued, Print    predniSONE (DELTASONE) 10 MG tablet Take 40mg  po daily for 3 days then 30mg  po daily for 3 days then 20mg  po daily for 3 days then 10mg  po daily for 3 days then stop, Print      CONTINUE these medications which have CHANGED   Details  albuterol (PROVENTIL) (2.5 MG/3ML) 0.083% nebulizer solution Take 3 mLs (2.5 mg total) by nebulization every 6 (six) hours as needed for wheezing or shortness of breath., Starting 02/27/2014, Until Discontinued, Print      CONTINUE these medications which have NOT CHANGED   Details  albuterol (PROVENTIL HFA;VENTOLIN HFA) 108 (90 BASE) MCG/ACT inhaler Inhale 2 puffs into the lungs every 4 (four) hours as needed for wheezing or shortness of breath. Shortness of breath, Starting 01/06/2012, Until Discontinued, Print    budesonide-formoterol (SYMBICORT) 160-4.5 MCG/ACT inhaler Inhale 3 puffs into the lungs 4 (four) times daily., Until Discontinued, Historical Med    furosemide (LASIX) 40 MG tablet Take 40 mg by mouth daily., Until Discontinued, Historical Med    gabapentin (NEURONTIN) 300 MG capsule Take 300 mg by mouth every 8 (eight) hours., Until Discontinued, Historical Med    morphine (MS CONTIN) 30 MG 12 hr tablet Take 30 mg by mouth 3 (three) times daily., Starting 01/05/2014, Until Discontinued, Historical Med    Naloxone HCl (EVZIO) 0.4 MG/0.4ML SOAJ Inject as directed once as needed (EVZIO is an opioid antagonist indicated for the emergency treatment of known or suspected opioid overdose, as manifested by respiratory and/or central nervous system depression. EVZIO is intended for immediate administr ation as emergency therapy in settings where opioids may be present. EVZIO is not a substitute for emergency medical care.)., Until Discontinued, Historical Med    nicotine (NICODERM CQ - DOSED IN  MG/24 HOURS) 14 mg/24hr patch Place 14 mg onto the skin daily., Until Discontinued, Historical Med    oxyCODONE (ROXICODONE) 15 MG immediate release tablet Take 30 mg by mouth every 4 (four) hours as needed for pain. , Starting 01/05/2014, Until Discontinued, Historical Med    Polyethyl Glycol-Propyl Glycol (SYSTANE) 0.4-0.3 % SOLN Place 1-2 drops into the left eye daily as needed (for dry eye relief). , Until Discontinued, Historical Med    potassium chloride SA (K-DUR,KLOR-CON) 20 MEQ tablet Take 20 mEq by mouth daily., Until Discontinued, Historical Med    senna (SENOKOT) 8.6 MG tablet Take 2 tablets by mouth 2 (two) times daily., Until Discontinued, Historical Med    tiotropium (SPIRIVA) 18 MCG inhalation capsule Place 1 capsule (18 mcg total) into inhaler and inhale daily., Starting 07/24/2011, Until Discontinued, Print      STOP taking these medications     amoxicillin-clavulanate (AUGMENTIN) 875-125 MG per tablet      Fluticasone-Salmeterol (ADVAIR) 250-50 MCG/DOSE AEPB      cephALEXin (KEFLEX) 500 MG capsule      oxyCODONE-acetaminophen (PERCOCET) 10-325 MG per tablet      promethazine (  PHENERGAN) 25 MG tablet        Allergies  Allergen Reactions  . Ambien [Zolpidem Tartrate] Other (See Comments)    hallucinations   Follow-up Information    Follow up with [facility].   Contact information:   [place] [address] [phone]       Follow up with HASANAJ,XAJE A, MD. Schedule an appointment as soon as possible for a visit in 2 weeks.   Specialty:  Internal Medicine   Contact information:   [place] [id number] [phone]        The results of significant diagnostics from this hospitalization (including imaging, microbiology, ancillary and laboratory) are listed below for reference.    Significant Diagnostic Studies: Dg Chest 2 View  02/24/2014   CLINICAL DATA:  Shortness of breath, productive  cough.  EXAM: CHEST  2 VIEW  COMPARISON:  Most recent exam 06/28/2013, multiple priors.  FINDINGS: The lungs are hyperinflated with emphysema. Previous large bulla in the left upper lung is no longer seen, new chain sutures in the left upper hemithorax, likely post left upper bullectomy. Postsurgical change also seen in the right upper hemithorax. There is no pneumothorax. No parenchymal consolidation. Heart size is normal. No pulmonary edema. The bones appear under mineralized but without acute abnormality.  IMPRESSION: Emphysema and postsurgical change.  No confluent airspace disease.   Electronically Signed   By: Jeb Levering M.D.   On: 02/24/2014 15:05    Microbiology: Recent Results (from the past 240 hour(s))  Culture, blood (routine x 2)     Status: None (Preliminary result)   Collection Time: 02/24/14  5:18 PM  Result Value Ref Range Status   Specimen Description BLOOD LEFT ANTECUBITAL  Final   Special Requests   Final    BOTTLES DRAWN AEROBIC AND ANAEROBIC AEB=10CC ANA=6CC   Culture NO GROWTH 2 DAYS  Final   Report Status PENDING  Incomplete  Culture, blood (routine x 2)     Status: None (Preliminary result)   Collection Time: 02/24/14  5:22 PM  Result Value Ref Range Status   Specimen Description BLOOD RIGHT ANTECUBITAL  Final   Special Requests   Final    BOTTLES DRAWN AEROBIC AND ANAEROBIC AEB=10CC ANA=6CC   Culture NO GROWTH 2 DAYS  Final   Report Status PENDING  Incomplete     Labs: Basic Metabolic Panel:  Recent Labs Lab 02/24/14 1420 02/25/14 0630  NA 140 137  K 3.4* 4.0  CL 108 102  CO2 25 27  GLUCOSE 92 165*  BUN 10 7  CREATININE 0.90 0.82  CALCIUM 9.3 9.5   Liver Function Tests: No results for input(s): AST, ALT, ALKPHOS, BILITOT, PROT, ALBUMIN in the last 168 hours. No results for input(s): LIPASE, AMYLASE in the last 168 hours. No results for input(s): AMMONIA in the last 168 hours. CBC:  Recent Labs Lab 02/24/14 1420 02/25/14 0630  WBC 8.7  7.4  NEUTROABS 6.6  --   HGB 14.9 13.7  HCT 43.7 41.0  MCV 93.2 94.3  PLT 363 322   Cardiac Enzymes: No results for input(s): CKTOTAL, CKMB, CKMBINDEX, TROPONINI in the last 168 hours. BNP: BNP (last 3 results) No results for input(s): PROBNP in the last 8760 hours. CBG:  Recent Labs Lab 02/27/14 0934  GLUCAP 167*       Signed:  MEMON,JEHANZEB  [facility] 02/27/2014, 7:29 PM

## 2014-02-27 NOTE — Plan of Care (Signed)
Problem: Phase II Progression Outcomes Goal: Dyspnea controlled w/progressive activity Outcome: Completed/Met Date Met:  02/27/14 Patient was able to ambulate over 300 ft. On RA, while maintaining O2 saturations of 92%. Goal: ADLs completed with minimal assistance Outcome: Completed/Met Date Met:  02/27/14 Patient up ad lib in room; able to perform ADLs independently.

## 2014-02-27 NOTE — Progress Notes (Signed)
NURSING PROGRESS NOTE  LOGON UTTECH [id number] Discharge Data: 02/27/2014 6:28 PM Attending Provider: No att. providers found DUP:BDHDIXB,OERQ A, MD   Wesley [name] to be D/C'd Home per MD order.    All IV's discontinued and monitored for bleeding.  All belongings returned to patient for patient to take home.  AVS summary and prescriptions reviewed with patient. Inhalers and Spiriva given to patient as well as 1 pack of Ensures.  Patient left floor via wheelchair, escorted by NT.  Last Documented Vital Signs:  Blood pressure 134/69, pulse 105, temperature 97.9 F (36.6 C), temperature source Oral, resp. rate 21, height 6\' 1"  (1.854 m), weight 78.472 kg (173 lb), SpO2 91 %.  Cecilie Kicks D

## 2014-02-27 NOTE — Progress Notes (Signed)
Pt ambulated around the hall four times. Pt states he is having heartburn. Paged the MD. Awaiting response. Will continue to monitor pt.

## 2014-02-27 NOTE — Progress Notes (Signed)
   02/27/14 1246  Mobility  Activity Ambulate in hall  Level of Assistance Modified independent, requires aide device or extra time  Assistive Device [facility] Ambulated (ft) 300 ft  Ambulation Response Tolerated fair

## 2014-03-01 LAB — CULTURE, BLOOD (ROUTINE X 2)
CULTURE: NO GROWTH
Culture: NO GROWTH

## 2014-05-15 ENCOUNTER — Encounter (HOSPITAL_COMMUNITY): Payer: Self-pay | Admitting: *Deleted

## 2014-05-15 ENCOUNTER — Inpatient Hospital Stay (HOSPITAL_COMMUNITY)
Admission: EM | Admit: 2014-05-15 | Discharge: 2014-05-18 | DRG: 190 | Disposition: A | Discharge status: Home Health | Payer: Medicare Other | Attending: Internal Medicine | Admitting: Internal Medicine

## 2014-05-15 ENCOUNTER — Emergency Department (HOSPITAL_COMMUNITY): Payer: Medicare Other

## 2014-05-15 DIAGNOSIS — F17210 Nicotine dependence, cigarettes, uncomplicated: Secondary | ICD-10-CM | POA: Diagnosis present

## 2014-05-15 DIAGNOSIS — Z809 Family history of malignant neoplasm, unspecified: Secondary | ICD-10-CM

## 2014-05-15 DIAGNOSIS — R112 Nausea with vomiting, unspecified: Secondary | ICD-10-CM | POA: Diagnosis present

## 2014-05-15 DIAGNOSIS — M810 Age-related osteoporosis without current pathological fracture: Secondary | ICD-10-CM | POA: Diagnosis present

## 2014-05-15 DIAGNOSIS — F1120 Opioid dependence, uncomplicated: Secondary | ICD-10-CM | POA: Diagnosis present

## 2014-05-15 DIAGNOSIS — R911 Solitary pulmonary nodule: Secondary | ICD-10-CM

## 2014-05-15 DIAGNOSIS — Z8249 Family history of ischemic heart disease and other diseases of the circulatory system: Secondary | ICD-10-CM

## 2014-05-15 DIAGNOSIS — J9610 Chronic respiratory failure, unspecified whether with hypoxia or hypercapnia: Secondary | ICD-10-CM | POA: Diagnosis present

## 2014-05-15 DIAGNOSIS — J9620 Acute and chronic respiratory failure, unspecified whether with hypoxia or hypercapnia: Secondary | ICD-10-CM | POA: Diagnosis present

## 2014-05-15 DIAGNOSIS — Z833 Family history of diabetes mellitus: Secondary | ICD-10-CM

## 2014-05-15 DIAGNOSIS — J441 Chronic obstructive pulmonary disease with (acute) exacerbation: Principal | ICD-10-CM | POA: Diagnosis present

## 2014-05-15 DIAGNOSIS — Z720 Tobacco use: Secondary | ICD-10-CM | POA: Diagnosis present

## 2014-05-15 DIAGNOSIS — E860 Dehydration: Secondary | ICD-10-CM | POA: Diagnosis present

## 2014-05-15 DIAGNOSIS — Z9981 Dependence on supplemental oxygen: Secondary | ICD-10-CM

## 2014-05-15 DIAGNOSIS — R0602 Shortness of breath: Secondary | ICD-10-CM | POA: Diagnosis not present

## 2014-05-15 DIAGNOSIS — G8929 Other chronic pain: Secondary | ICD-10-CM | POA: Diagnosis present

## 2014-05-15 DIAGNOSIS — Z7289 Other problems related to lifestyle: Secondary | ICD-10-CM

## 2014-05-15 LAB — BASIC METABOLIC PANEL
ANION GAP: 8 (ref 5–15)
BUN: 12 mg/dL (ref 6–23)
CALCIUM: 9.5 mg/dL (ref 8.4–10.5)
CHLORIDE: 103 mmol/L (ref 96–112)
CO2: 29 mmol/L (ref 19–32)
Creatinine, Ser: 0.8 mg/dL (ref 0.50–1.35)
GFR calc Af Amer: >90 mL/min (ref >90)
GFR calc non Af Amer: >90 mL/min (ref >90)
GLUCOSE: 108 mg/dL — AB (ref 70–99)
Potassium: 3.8 mmol/L (ref 3.5–5.1)
SODIUM: 140 mmol/L (ref 135–145)

## 2014-05-15 LAB — CBC
HCT: 45 % (ref 39.0–52.0)
HEMOGLOBIN: 15.4 g/dL (ref 13.0–17.0)
MCH: 32.1 pg (ref 26.0–34.0)
MCHC: 34.2 g/dL (ref 30.0–36.0)
MCV: 93.8 fL (ref 78.0–100.0)
PLATELETS: 312 10*3/uL (ref 150–400)
RBC: 4.8 MIL/uL (ref 4.22–5.81)
RDW: 12.4 % (ref 11.5–15.5)
WBC: 10.6 10*3/uL — ABNORMAL HIGH (ref 4.0–10.5)

## 2014-05-15 MED ORDER — SODIUM CHLORIDE 0.9 % IV SOLN
1000.0000 mL | Freq: Once | INTRAVENOUS | Status: AC
  Administered 2014-05-15: 1000 mL via INTRAVENOUS

## 2014-05-15 MED ORDER — FENTANYL CITRATE 0.05 MG/ML IJ SOLN
100.0000 ug | Freq: Once | INTRAMUSCULAR | Status: AC
  Administered 2014-05-15: 100 ug via INTRAVENOUS
  Filled 2014-05-15: qty 2

## 2014-05-15 MED ORDER — METOCLOPRAMIDE HCL 5 MG/ML IJ SOLN
10.0000 mg | Freq: Once | INTRAMUSCULAR | Status: AC
  Administered 2014-05-15: 10 mg via INTRAVENOUS
  Filled 2014-05-15: qty 2

## 2014-05-15 MED ORDER — SODIUM CHLORIDE 0.9 % IV SOLN
1000.0000 mL | INTRAVENOUS | Status: DC
  Administered 2014-05-16: 1000 mL via INTRAVENOUS

## 2014-05-15 MED ORDER — METHYLPREDNISOLONE SODIUM SUCC 125 MG IJ SOLR
125.0000 mg | Freq: Once | INTRAMUSCULAR | Status: AC
  Administered 2014-05-15: 125 mg via INTRAVENOUS
  Filled 2014-05-15: qty 2

## 2014-05-15 MED ORDER — SODIUM CHLORIDE 0.9 % IV SOLN
1000.0000 mL | Freq: Once | INTRAVENOUS | Status: AC
  Administered 2014-05-16: 1000 mL via INTRAVENOUS

## 2014-05-15 MED ORDER — DIPHENHYDRAMINE HCL 50 MG/ML IJ SOLN
25.0000 mg | Freq: Once | INTRAMUSCULAR | Status: AC
  Administered 2014-05-15: 25 mg via INTRAVENOUS
  Filled 2014-05-15: qty 1

## 2014-05-15 MED ORDER — LEVOFLOXACIN IN D5W 750 MG/150ML IV SOLN
750.0000 mg | Freq: Once | INTRAVENOUS | Status: AC
  Filled 2014-05-15: qty 150

## 2014-05-15 MED ORDER — IPRATROPIUM BROMIDE 0.02 % IN SOLN
0.5000 mg | Freq: Once | RESPIRATORY_TRACT | Status: AC
  Administered 2014-05-15: 0.5 mg via RESPIRATORY_TRACT
  Filled 2014-05-15: qty 2.5

## 2014-05-15 MED ORDER — ALBUTEROL (5 MG/ML) CONTINUOUS INHALATION SOLN
15.0000 mg/h | INHALATION_SOLUTION | Freq: Once | RESPIRATORY_TRACT | Status: AC
  Administered 2014-05-15: 15 mg/h via RESPIRATORY_TRACT
  Filled 2014-05-15: qty 20

## 2014-05-15 NOTE — ED Notes (Signed)
Patient states that he has been vomiting for 2 days, having diarrhea, and has been unable to take his medications at home.

## 2014-05-15 NOTE — ED Provider Notes (Signed)
CSN: [id number]     Arrival date & time 05/15/14  1936 History  This chart was scribed for Rolland Porter, MD by Chester Holstein, ED Scribe. This patient was seen in room APA04/APA04 and the patient's care was started at 11:19 PM.    Chief Complaint  Patient presents with  . Shortness of Breath     Patient is a 49 y.o. male presenting with shortness of breath. The history is provided by the patient. No language interpreter was used.  Shortness of Breath Associated symptoms: abdominal pain, cough, vomiting and wheezing   Associated symptoms: no fever    HPI Comments: Wesley [name] is a 49 y.o. male who presents to the Emergency Department complaining of vomiting with onset 3 days ago and vomiting 4-5 times a day. Pt unable to tolerate PO intake. Pt notes vomiting with eating or taking his medications. Pt takes oxycodone daily. Pt notes abdominal soreness from coughing and vomiting but not abdominal pain, associated diarrhea 4 times with onset today, increased cough now with yellow sputum, wheezing, and SOB. Pt with h/o COPD and asbestosis in lungs with lung surgery in June 2015 at [facility] by robotic arm. He reports he has been wheezing.  Pt states he has used breathing treatment every 4 hours for relief.  Pt is a former smoker. Pt denies recent sick contacts, fever, and chills. No food is suspected to have made him ill. Pt is on 3 L O2 at home.   Pt's PCP is Dr. Sherrie Sport.   Past Medical History  Diagnosis Date  . COPD (chronic obstructive pulmonary disease)   . Osteoporosis   . Neuropathy   . Back pain   . Depression   . Avascular necrosis of femur    Past Surgical History  Procedure Laterality Date  . Joint replacement    . Back surgery    . Lung surgery     Family History  Problem Relation Age of Onset  . Heart failure Father   . Diabetes Father   . Diabetes Mother   . Cancer Mother     male cancer   History  Substance Use Topics  . Smoking status: Current Every Day  Smoker -- 1.00 packs/day    Last Attempt to Quit: 07/05/2011  . Smokeless tobacco: Not on file  . Alcohol Use: No  lives with spouse Oxygen 3 lpm [place] Quit smoking 1 year ago now uses e cigs and nicotene patches  Review of Systems  Constitutional: Negative for fever and chills.  Respiratory: Positive for cough, shortness of breath and wheezing.   Gastrointestinal: Positive for vomiting, abdominal pain and diarrhea.  All other systems reviewed and are negative.     Allergies  Ambien  Home Medications   Prior to Admission medications   Medication Sig Start Date End Date Taking? Authorizing Provider  albuterol (PROVENTIL HFA;VENTOLIN HFA) 108 (90 BASE) MCG/ACT inhaler Inhale 2 puffs into the lungs every 4 (four) hours as needed for wheezing or shortness of breath. Shortness of breath 01/06/12   Delfina Redwood, MD  albuterol (PROVENTIL) (2.5 MG/3ML) 0.083% nebulizer solution Take 3 mLs (2.5 mg total) by nebulization every 6 (six) hours as needed for wheezing or shortness of breath. 02/27/14   Kathie Dike, MD  budesonide-formoterol (SYMBICORT) 160-4.5 MCG/ACT inhaler Inhale 3 puffs into the lungs 4 (four) times daily.    Historical Provider, MD  furosemide (LASIX) 40 MG tablet Take 40 mg by mouth daily.    Historical Provider, MD  gabapentin (NEURONTIN) 300 MG capsule Take 300 mg by mouth every 8 (eight) hours.    Historical Provider, MD  HYDROcodone-homatropine (HYCODAN) 5-1.5 MG/5ML syrup Take 5 mLs by mouth every 4 (four) hours as needed for cough. 02/27/14   Kathie Dike, MD  levofloxacin (LEVAQUIN) 500 MG tablet Take 1 tablet (500 mg total) by mouth daily at 6 PM. 02/27/14   Kathie Dike, MD  morphine (MS CONTIN) 30 MG 12 hr tablet Take 30 mg by mouth 3 (three) times daily. 01/05/14   Historical Provider, MD  Naloxone HCl (EVZIO) 0.4 MG/0.4ML SOAJ Inject as directed once as needed (EVZIO is an opioid antagonist indicated for the emergency treatment of known or suspected opioid  overdose, as manifested by respiratory and/or central nervous system depression. EVZIO is intended for immediate administration as emergency therapy in settings where opioids may be present. EVZIO is not a substitute for emergency medical care.).    Historical Provider, MD  nicotine (NICODERM CQ - DOSED IN MG/24 HOURS) 14 mg/24hr patch Place 14 mg onto the skin daily.    Historical Provider, MD  oxyCODONE (ROXICODONE) 15 MG immediate release tablet Take 30 mg by mouth every 4 (four) hours as needed for pain.  01/05/14   Historical Provider, MD  Polyethyl Glycol-Propyl Glycol (SYSTANE) 0.4-0.3 % SOLN Place 1-2 drops into the left eye daily as needed (for dry eye relief).     Historical Provider, MD  potassium chloride SA (K-DUR,KLOR-CON) 20 MEQ tablet Take 20 mEq by mouth daily.    Historical Provider, MD  predniSONE (DELTASONE) 10 MG tablet Take 40mg  po daily for 3 days then 30mg  po daily for 3 days then 20mg  po daily for 3 days then 10mg  po daily for 3 days then stop 02/27/14   Kathie Dike, MD  senna (SENOKOT) 8.6 MG tablet Take 2 tablets by mouth 2 (two) times daily.    Historical Provider, MD  tiotropium (SPIRIVA) 18 MCG inhalation capsule Place 1 capsule (18 mcg total) into inhaler and inhale daily. 07/24/11   Nimish C Anastasio Champion, MD   BP 100/64 mmHg  Pulse 77  Temp(Src) 98.1 F (36.7 C) (Oral)  Resp 13  Ht 6\' 1"  (1.854 m)  Wt 175 lb (79.379 kg)  BMI 23.09 kg/m2  SpO2 98%  Vital signs normal   Physical Exam  Constitutional: He is oriented to person, place, and time. He appears well-developed and well-nourished.  Non-toxic appearance. He does not appear ill. No distress.  HENT:  Head: Normocephalic and atraumatic.  Right Ear: External ear normal.  Left Ear: External ear normal.  Nose: Nose normal. No mucosal edema or rhinorrhea.  Mouth/Throat: Oropharynx is clear and moist and mucous membranes are normal. No dental abscesses or uvula swelling.  Eyes: Conjunctivae and EOM are normal.  Pupils are equal, round, and reactive to light.  Neck: Normal range of motion and full passive range of motion without pain. Neck supple.  Cardiovascular: Normal rate, regular rhythm and normal heart sounds.  Exam reveals no gallop and no friction rub.   No murmur heard. Pulmonary/Chest: Effort normal. No respiratory distress. He has wheezes (diffuse). He has no rhonchi. He has no rales. He exhibits no tenderness and no crepitus.  Pt coughing frequently in exam room.  Abdominal: Soft. Normal appearance and bowel sounds are normal. He exhibits no distension. There is no tenderness. There is no rebound and no guarding.  Musculoskeletal: Normal range of motion. He exhibits no edema or tenderness.  Moves all extremities well.  Neurological: He is alert and oriented to person, place, and time. He has normal strength. No cranial nerve deficit.  Skin: Skin is warm, dry and intact. No rash noted. No erythema. No pallor.  Psychiatric: He has a normal mood and affect. His speech is normal and behavior is normal. His mood appears not anxious.  Nursing note and vitals reviewed.   ED Course  Procedures (including critical care time)  Medications  0.9 %  sodium chloride infusion (0 mLs Intravenous Stopped 05/16/14 0043)    Followed by  0.9 %  sodium chloride infusion (0 mLs Intravenous Stopped 05/16/14 0227)    Followed by  0.9 %  sodium chloride infusion (1,000 mLs Intravenous New Bag/Given 05/16/14 0244)  levofloxacin (LEVAQUIN) IVPB 750 mg (0 mg Intravenous Stopped 05/16/14 0227)  metoCLOPramide (REGLAN) injection 10 mg (10 mg Intravenous Given 05/15/14 2345)  diphenhydrAMINE (BENADRYL) injection 25 mg (25 mg Intravenous Given 05/15/14 2348)  methylPREDNISolone sodium succinate (SOLU-MEDROL) 125 mg/2 mL injection 125 mg (125 mg Intravenous Given 05/15/14 2340)  albuterol (PROVENTIL,VENTOLIN) solution continuous neb (15 mg/hr Nebulization Given 05/15/14 2337)  ipratropium (ATROVENT) nebulizer solution 0.5  mg (0.5 mg Nebulization Given 05/15/14 2337)  fentaNYL (SUBLIMAZE) injection 100 mcg (100 mcg Intravenous Given 05/15/14 2349)  fentaNYL (SUBLIMAZE) injection 50 mcg (50 mcg Intravenous Given 05/16/14 0242)  promethazine (PHENERGAN) injection 12.5 mg (12.5 mg Intravenous Given 05/16/14 0241)    DIAGNOSTIC STUDIES: Oxygen Saturation is 100% on room air, normal by my interpretation.    COORDINATION OF CARE: 11:28 PM Discussed treatment plan with patient at beside, the patient agrees with the plan and has no further questions at this time.  01:00 Recheck, still getting his continuous nebulizer. Has diminished breath sounds and scattered wheezing.   2 AM patient states he is supposed to be admitted due to his "depressed immune system". He is still complaining of nausea and because of his vomiting and coughing has increased the pain in his back.  03:14 Dr Darrick Meigs, admit to [facility] Review Results for orders placed or performed during the hospital encounter of 05/15/14  CBC  Result Value Ref Range   WBC 10.6 (H) 4.0 - 10.5 K/uL   RBC 4.80 4.22 - 5.81 MIL/uL   Hemoglobin 15.4 13.0 - 17.0 g/dL   HCT 45.0 39.0 - 52.0 %   MCV 93.8 78.0 - 100.0 fL   MCH 32.1 26.0 - 34.0 pg   MCHC 34.2 30.0 - 36.0 g/dL   RDW 12.4 11.5 - 15.5 %   Platelets 312 150 - 400 K/uL  Basic metabolic panel  Result Value Ref Range   Sodium 140 135 - 145 mmol/L   Potassium 3.8 3.5 - 5.1 mmol/L   Chloride 103 96 - 112 mmol/L   CO2 29 19 - 32 mmol/L   Glucose, Bld 108 (H) 70 - 99 mg/dL   BUN 12 6 - 23 mg/dL   Creatinine, Ser 0.80 0.50 - 1.35 mg/dL   Calcium 9.5 8.4 - 10.5 mg/dL   GFR calc non Af Amer >90 >90 mL/min   GFR calc Af Amer >90 >90 mL/min   Anion gap 8 5 - 15   Laboratory interpretation all normal except mild leukocytosis   Imaging Review Dg Chest 2 View  05/15/2014   CLINICAL DATA:  Shortness of breath for 1 day worse in past few hours, vomiting, diarrhea for 3 days, history COPD, former smoker   EXAM: CHEST  2 VIEW  COMPARISON:  02/24/2014  FINDINGS:  Normal heart size, mediastinal contours, and pulmonary vascularity.  Emphysematous and minimal bronchitic changes consistent with COPD.  Postsurgical changes in the upper lobes bilaterally and at minor fissure.  Questionable vague nodular density in the lateral mid LEFT lung.  Remaining lungs clear.  No infiltrate, pleural effusion or pneumothorax.  Bones demineralized.  IMPRESSION: COPD changes with scattered areas of scarring and postsurgical change.  Questionable vague nodular density lateral LEFT mid lung; CT chest recommended to exclude pulmonary nodule.   Electronically Signed   By: Lavonia Dana M.D.   On: 05/15/2014 21:43     EKG Interpretation   Date/Time:  Friday May 15 2014 20:10:37 EST Ventricular Rate:  88 PR Interval:  164 QRS Duration: 102 QT Interval:  356 QTC Calculation: 430 R Axis:   109 Text Interpretation:  Normal sinus rhythm Rightward axis Borderline ECG No  significant change since last tracing Confirmed by Christy Gentles  MD, DONALD  [phone]) on 05/15/2014 8:17:25 PM      MDM   Final diagnoses:  COPD exacerbation  Nausea and vomiting, vomiting of unspecified type  Chronic pain    Plan admission  Rolland Porter, MD, FACEP   I personally performed the services described in this documentation, which was scribed in my presence. The recorded information has been reviewed and considered.  Rolland Porter, MD, Barbette Or, MD 05/16/14 [phone]

## 2014-05-15 NOTE — ED Notes (Addendum)
Pt states he has been sick x 4 days and has had parts of both lungs removed. Pt states he is sob and it has gotten worse today. None of his home treatments are working. Pt also c/o chest pain all throughout chest.

## 2014-05-16 DIAGNOSIS — J441 Chronic obstructive pulmonary disease with (acute) exacerbation: Principal | ICD-10-CM

## 2014-05-16 DIAGNOSIS — Z8249 Family history of ischemic heart disease and other diseases of the circulatory system: Secondary | ICD-10-CM | POA: Diagnosis not present

## 2014-05-16 DIAGNOSIS — R911 Solitary pulmonary nodule: Secondary | ICD-10-CM | POA: Diagnosis present

## 2014-05-16 DIAGNOSIS — Z809 Family history of malignant neoplasm, unspecified: Secondary | ICD-10-CM | POA: Diagnosis not present

## 2014-05-16 DIAGNOSIS — Z833 Family history of diabetes mellitus: Secondary | ICD-10-CM | POA: Diagnosis not present

## 2014-05-16 DIAGNOSIS — G43A0 Cyclical vomiting, not intractable: Secondary | ICD-10-CM | POA: Diagnosis not present

## 2014-05-16 DIAGNOSIS — G8929 Other chronic pain: Secondary | ICD-10-CM | POA: Diagnosis present

## 2014-05-16 DIAGNOSIS — R112 Nausea with vomiting, unspecified: Secondary | ICD-10-CM | POA: Diagnosis present

## 2014-05-16 DIAGNOSIS — Z7289 Other problems related to lifestyle: Secondary | ICD-10-CM | POA: Diagnosis not present

## 2014-05-16 DIAGNOSIS — F17210 Nicotine dependence, cigarettes, uncomplicated: Secondary | ICD-10-CM | POA: Diagnosis present

## 2014-05-16 DIAGNOSIS — R0602 Shortness of breath: Secondary | ICD-10-CM | POA: Diagnosis present

## 2014-05-16 DIAGNOSIS — E860 Dehydration: Secondary | ICD-10-CM | POA: Diagnosis present

## 2014-05-16 DIAGNOSIS — Z9981 Dependence on supplemental oxygen: Secondary | ICD-10-CM | POA: Diagnosis not present

## 2014-05-16 DIAGNOSIS — M810 Age-related osteoporosis without current pathological fracture: Secondary | ICD-10-CM | POA: Diagnosis present

## 2014-05-16 DIAGNOSIS — F1120 Opioid dependence, uncomplicated: Secondary | ICD-10-CM | POA: Diagnosis present

## 2014-05-16 DIAGNOSIS — J9620 Acute and chronic respiratory failure, unspecified whether with hypoxia or hypercapnia: Secondary | ICD-10-CM | POA: Diagnosis present

## 2014-05-16 LAB — EXPECTORATED SPUTUM ASSESSMENT W GRAM STAIN, RFLX TO RESP C

## 2014-05-16 LAB — COMPREHENSIVE METABOLIC PANEL
ALK PHOS: 73 U/L (ref 39–117)
ALT: 18 U/L (ref 0–53)
AST: 23 U/L (ref 0–37)
Albumin: 3.8 g/dL (ref 3.5–5.2)
Anion gap: 6 (ref 5–15)
BUN: 11 mg/dL (ref 6–23)
CO2: 25 mmol/L (ref 19–32)
CREATININE: 0.83 mg/dL (ref 0.50–1.35)
Calcium: 8.9 mg/dL (ref 8.4–10.5)
Chloride: 109 mmol/L (ref 96–112)
GFR calc Af Amer: >90 mL/min (ref >90)
Glucose, Bld: 174 mg/dL — ABNORMAL HIGH (ref 70–99)
POTASSIUM: 4 mmol/L (ref 3.5–5.1)
Sodium: 140 mmol/L (ref 135–145)
TOTAL PROTEIN: 6.9 g/dL (ref 6.0–8.3)
Total Bilirubin: 0.5 mg/dL (ref 0.3–1.2)

## 2014-05-16 LAB — CBC
HEMATOCRIT: 40.8 % (ref 39.0–52.0)
Hemoglobin: 13.6 g/dL (ref 13.0–17.0)
MCH: 31 pg (ref 26.0–34.0)
MCHC: 33.3 g/dL (ref 30.0–36.0)
MCV: 92.9 fL (ref 78.0–100.0)
Platelets: 291 10*3/uL (ref 150–400)
RBC: 4.39 MIL/uL (ref 4.22–5.81)
RDW: 12.1 % (ref 11.5–15.5)
WBC: 7.7 10*3/uL (ref 4.0–10.5)

## 2014-05-16 LAB — EXPECTORATED SPUTUM ASSESSMENT W REFEX TO RESP CULTURE

## 2014-05-16 LAB — INFLUENZA PANEL BY PCR (TYPE A & B)
H1N1FLUPCR: NOT DETECTED
INFLAPCR: NEGATIVE
INFLBPCR: NEGATIVE

## 2014-05-16 MED ORDER — BUDESONIDE-FORMOTEROL FUMARATE 160-4.5 MCG/ACT IN AERO
3.0000 | INHALATION_SPRAY | Freq: Four times a day (QID) | RESPIRATORY_TRACT | Status: DC
  Administered 2014-05-16 – 2014-05-18 (×10): 3 via RESPIRATORY_TRACT
  Filled 2014-05-16: qty 6

## 2014-05-16 MED ORDER — FENTANYL CITRATE 0.05 MG/ML IJ SOLN
25.0000 ug | INTRAMUSCULAR | Status: DC | PRN
  Administered 2014-05-16: 25 ug via INTRAVENOUS
  Filled 2014-05-16: qty 2

## 2014-05-16 MED ORDER — SODIUM CHLORIDE 0.9 % IJ SOLN: 3.0000 mL | INTRAMUSCULAR | Status: DC | PRN

## 2014-05-16 MED ORDER — SODIUM CHLORIDE 0.9 % IV SOLN: 250.0000 mL | INTRAVENOUS | Status: DC | PRN

## 2014-05-16 MED ORDER — HYDROCODONE-HOMATROPINE 5-1.5 MG/5ML PO SYRP
5.0000 mL | ORAL_SOLUTION | ORAL | Status: DC | PRN
  Administered 2014-05-16 – 2014-05-18 (×10): 5 mL via ORAL
  Filled 2014-05-16 (×10): qty 5

## 2014-05-16 MED ORDER — MORPHINE SULFATE ER 30 MG PO TBCR: 30.0000 mg | EXTENDED_RELEASE_TABLET | Freq: Three times a day (TID) | ORAL | Status: DC

## 2014-05-16 MED ORDER — ENOXAPARIN SODIUM 40 MG/0.4ML ~~LOC~~ SOLN
40.0000 mg | SUBCUTANEOUS | Status: DC
  Administered 2014-05-16 – 2014-05-18 (×3): 40 mg via SUBCUTANEOUS
  Filled 2014-05-16 (×3): qty 0.4

## 2014-05-16 MED ORDER — LEVOFLOXACIN IN D5W 750 MG/150ML IV SOLN
750.0000 mg | INTRAVENOUS | Status: DC
  Administered 2014-05-16 – 2014-05-17 (×2): 750 mg via INTRAVENOUS
  Filled 2014-05-16 (×2): qty 150

## 2014-05-16 MED ORDER — ALBUTEROL SULFATE (2.5 MG/3ML) 0.083% IN NEBU: 2.5000 mg | INHALATION_SOLUTION | RESPIRATORY_TRACT | Status: DC | PRN

## 2014-05-16 MED ORDER — ONDANSETRON HCL 4 MG PO TABS
4.0000 mg | ORAL_TABLET | Freq: Four times a day (QID) | ORAL | Status: DC | PRN
  Filled 2014-05-16: qty 1

## 2014-05-16 MED ORDER — IPRATROPIUM-ALBUTEROL 0.5-2.5 (3) MG/3ML IN SOLN
3.0000 mL | Freq: Four times a day (QID) | RESPIRATORY_TRACT | Status: DC
  Administered 2014-05-16: 3 mL via RESPIRATORY_TRACT
  Filled 2014-05-16: qty 3

## 2014-05-16 MED ORDER — IPRATROPIUM BROMIDE 0.02 % IN SOLN: 0.5000 mg | Freq: Four times a day (QID) | RESPIRATORY_TRACT | Status: DC

## 2014-05-16 MED ORDER — SODIUM CHLORIDE 0.9 % IV SOLN
INTRAVENOUS | Status: DC
  Administered 2014-05-16 (×3): via INTRAVENOUS
  Administered 2014-05-17: 1000 mL via INTRAVENOUS
  Administered 2014-05-18: 05:00:00 via INTRAVENOUS

## 2014-05-16 MED ORDER — FENTANYL CITRATE 0.05 MG/ML IJ SOLN
50.0000 ug | Freq: Once | INTRAMUSCULAR | Status: AC
  Administered 2014-05-16: 50 ug via INTRAVENOUS
  Filled 2014-05-16: qty 2

## 2014-05-16 MED ORDER — GABAPENTIN 300 MG PO CAPS
300.0000 mg | ORAL_CAPSULE | Freq: Three times a day (TID) | ORAL | Status: DC
  Administered 2014-05-16 – 2014-05-18 (×8): 300 mg via ORAL
  Filled 2014-05-16 (×8): qty 1

## 2014-05-16 MED ORDER — SODIUM CHLORIDE 0.9 % IJ SOLN
3.0000 mL | Freq: Two times a day (BID) | INTRAMUSCULAR | Status: DC
  Administered 2014-05-16 – 2014-05-17 (×4): 3 mL via INTRAVENOUS

## 2014-05-16 MED ORDER — ACETAMINOPHEN 325 MG PO TABS: 650.0000 mg | ORAL_TABLET | Freq: Four times a day (QID) | ORAL | Status: DC | PRN

## 2014-05-16 MED ORDER — NICOTINE 14 MG/24HR TD PT24
14.0000 mg | MEDICATED_PATCH | Freq: Every day | TRANSDERMAL | Status: DC
  Administered 2014-05-16 – 2014-05-18 (×3): 14 mg via TRANSDERMAL
  Filled 2014-05-16 (×3): qty 1

## 2014-05-16 MED ORDER — IPRATROPIUM-ALBUTEROL 0.5-2.5 (3) MG/3ML IN SOLN
3.0000 mL | RESPIRATORY_TRACT | Status: DC
  Administered 2014-05-16 – 2014-05-18 (×14): 3 mL via RESPIRATORY_TRACT
  Filled 2014-05-16 (×14): qty 3

## 2014-05-16 MED ORDER — ONDANSETRON HCL 4 MG/2ML IJ SOLN: 4.0000 mg | Freq: Three times a day (TID) | INTRAMUSCULAR | Status: AC | PRN

## 2014-05-16 MED ORDER — MORPHINE SULFATE ER 30 MG PO TBCR
30.0000 mg | EXTENDED_RELEASE_TABLET | Freq: Three times a day (TID) | ORAL | Status: DC
Start: 2014-05-16 — End: 2014-05-18
  Administered 2014-05-16 – 2014-05-18 (×7): 30 mg via ORAL
  Filled 2014-05-16 (×8): qty 1

## 2014-05-16 MED ORDER — OXYCODONE HCL 5 MG PO TABS
30.0000 mg | ORAL_TABLET | ORAL | Status: DC | PRN
  Administered 2014-05-16 – 2014-05-18 (×14): 30 mg via ORAL
  Filled 2014-05-16 (×14): qty 6

## 2014-05-16 MED ORDER — METHYLPREDNISOLONE SODIUM SUCC 125 MG IJ SOLR
60.0000 mg | Freq: Four times a day (QID) | INTRAMUSCULAR | Status: DC
  Administered 2014-05-16 – 2014-05-18 (×10): 60 mg via INTRAVENOUS
  Filled 2014-05-16 (×10): qty 2

## 2014-05-16 MED ORDER — ALBUTEROL SULFATE (2.5 MG/3ML) 0.083% IN NEBU: 2.5000 mg | INHALATION_SOLUTION | Freq: Four times a day (QID) | RESPIRATORY_TRACT | Status: DC

## 2014-05-16 MED ORDER — CETYLPYRIDINIUM CHLORIDE 0.05 % MT LIQD
7.0000 mL | Freq: Two times a day (BID) | OROMUCOSAL | Status: DC
  Administered 2014-05-16 – 2014-05-18 (×5): 7 mL via OROMUCOSAL

## 2014-05-16 MED ORDER — MORPHINE SULFATE 2 MG/ML IJ SOLN
1.0000 mg | INTRAMUSCULAR | Status: DC | PRN
  Administered 2014-05-16 – 2014-05-17 (×10): 1 mg via INTRAVENOUS
  Filled 2014-05-16 (×10): qty 1

## 2014-05-16 MED ORDER — ONDANSETRON HCL 4 MG/2ML IJ SOLN
4.0000 mg | Freq: Four times a day (QID) | INTRAMUSCULAR | Status: DC | PRN
  Administered 2014-05-16 – 2014-05-18 (×9): 4 mg via INTRAVENOUS
  Filled 2014-05-16 (×8): qty 2

## 2014-05-16 MED ORDER — GUAIFENESIN ER 600 MG PO TB12
600.0000 mg | ORAL_TABLET | Freq: Two times a day (BID) | ORAL | Status: DC
  Administered 2014-05-16 – 2014-05-18 (×5): 600 mg via ORAL
  Filled 2014-05-16 (×5): qty 1

## 2014-05-16 MED ORDER — PROMETHAZINE HCL 25 MG/ML IJ SOLN
12.5000 mg | Freq: Once | INTRAMUSCULAR | Status: AC
  Administered 2014-05-16: 12.5 mg via INTRAVENOUS
  Filled 2014-05-16: qty 1

## 2014-05-16 MED ORDER — ACETAMINOPHEN 650 MG RE SUPP: 650.0000 mg | Freq: Four times a day (QID) | RECTAL | Status: DC | PRN

## 2014-05-16 NOTE — Progress Notes (Signed)
Utilization review Completed Parrish Daddario RN BSN   

## 2014-05-16 NOTE — H&P (Signed)
PCP:   HASANAJ,XAJE A, MD   Chief Complaint:  Nausea and vomiting  HPI: 49 year old male who   has a past medical history of COPD (chronic obstructive pulmonary disease); Osteoporosis; Neuropathy; Back pain; Depression; and Avascular necrosis of femur. Today presents to the hospital with chief complaint of nausea and vomiting was started 3 days ago. Patient says that he's been vomiting 4-5 times every day. Unable take anything by mouth. Patient also has noticed worsening shortness of breath he has home oxygen. Patient has a history of COPD and asbestosis and lung and had surgery in June 2015 at [facility]. He denies chest pains, admits to having abdominal pain from coughing. Chest x-ray shows COPD and questionable pulmonary nodule  Allergies:   Allergies  Allergen Reactions  . Ambien [Zolpidem Tartrate] Other (See Comments)    hallucinations      Past Medical History  Diagnosis Date  . COPD (chronic obstructive pulmonary disease)   . Osteoporosis   . Neuropathy   . Back pain   . Depression   . Avascular necrosis of femur     Past Surgical History  Procedure Laterality Date  . Joint replacement    . Back surgery    . Lung surgery      Prior to Admission medications   Medication Sig Start Date End Date Taking? Authorizing Provider  albuterol (PROVENTIL HFA;VENTOLIN HFA) 108 (90 BASE) MCG/ACT inhaler Inhale 2 puffs into the lungs every 4 (four) hours as needed for wheezing or shortness of breath. Shortness of breath 01/06/12   Delfina Redwood, MD  albuterol (PROVENTIL) (2.5 MG/3ML) 0.083% nebulizer solution Take 3 mLs (2.5 mg total) by nebulization every 6 (six) hours as needed for wheezing or shortness of breath. 02/27/14   Kathie Dike, MD  budesonide-formoterol (SYMBICORT) 160-4.5 MCG/ACT inhaler Inhale 3 puffs into the lungs 4 (four) times daily.    Historical Provider, MD  furosemide (LASIX) 40 MG tablet Take 40 mg by mouth daily.    Historical Provider, MD    gabapentin (NEURONTIN) 300 MG capsule Take 300 mg by mouth every 8 (eight) hours.    Historical Provider, MD  HYDROcodone-homatropine (HYCODAN) 5-1.5 MG/5ML syrup Take 5 mLs by mouth every 4 (four) hours as needed for cough. 02/27/14   Kathie Dike, MD  levofloxacin (LEVAQUIN) 500 MG tablet Take 1 tablet (500 mg total) by mouth daily at 6 PM. 02/27/14   Kathie Dike, MD  morphine (MS CONTIN) 30 MG 12 hr tablet Take 30 mg by mouth 3 (three) times daily. 01/05/14   Historical Provider, MD  Naloxone HCl (EVZIO) 0.4 MG/0.4ML SOAJ Inject as directed once as needed (EVZIO is an opioid antagonist indicated for the emergency treatment of known or suspected opioid overdose, as manifested by respiratory and/or central nervous system depression. EVZIO is intended for immediate administration as emergency therapy in settings where opioids may be present. EVZIO is not a substitute for emergency medical care.).    Historical Provider, MD  nicotine (NICODERM CQ - DOSED IN MG/24 HOURS) 14 mg/24hr patch Place 14 mg onto the skin daily.    Historical Provider, MD  oxyCODONE (ROXICODONE) 15 MG immediate release tablet Take 30 mg by mouth every 4 (four) hours as needed for pain.  01/05/14   Historical Provider, MD  Polyethyl Glycol-Propyl Glycol (SYSTANE) 0.4-0.3 % SOLN Place 1-2 drops into the left eye daily as needed (for dry eye relief).     Historical Provider, MD  potassium chloride SA (K-DUR,KLOR-CON) 20 MEQ  tablet Take 20 mEq by mouth daily.    Historical Provider, MD  predniSONE (DELTASONE) 10 MG tablet Take 40mg  po daily for 3 days then 30mg  po daily for 3 days then 20mg  po daily for 3 days then 10mg  po daily for 3 days then stop 02/27/14   Kathie Dike, MD  senna (SENOKOT) 8.6 MG tablet Take 2 tablets by mouth 2 (two) times daily.    Historical Provider, MD  tiotropium (SPIRIVA) 18 MCG inhalation capsule Place 1 capsule (18 mcg total) into inhaler and inhale daily. 07/24/11   Nimish Luther Parody, MD    Social  History:  reports that he has been smoking.  He does not have any smokeless tobacco history on file. He reports that he does not drink alcohol or use illicit drugs.  Family History  Problem Relation Age of Onset  . Heart failure Father   . Diabetes Father   . Diabetes Mother   . Cancer Mother     male cancer     All the positives are listed in BOLD  Review of Systems:  HEENT: Headache, blurred vision, runny nose, sore throat Neck: Hypothyroidism, hyperthyroidism,,lymphadenopathy Chest : Shortness of breath, history of COPD, Asthma Heart : Chest pain, history of coronary arterey disease GI:  Nausea, vomiting, diarrhea, constipation, GERD GU: Dysuria, urgency, frequency of urination, hematuria Neuro: Stroke, seizures, syncope Psych: Depression, anxiety, hallucinations   Physical Exam: Blood pressure 111/63, pulse 87, temperature 98.1 F (36.7 C), temperature source Oral, resp. rate 19, height 6\' 1"  (1.854 m), weight 79.379 kg (175 lb), SpO2 98 %. Constitutional:   Patient is a well-developed and well-nourished male* in no acute distress and cooperative with exam. Head: Normocephalic and atraumatic Mouth: Mucus membranes moist Eyes: PERRL, EOMI, conjunctivae normal Neck: Supple, No Thyromegaly Cardiovascular: RRR, S1 normal, S2 normal Pulmonary/Chest: Bilateral wheezing Abdominal: Soft. Non-tender, non-distended, bowel sounds are normal, no masses, organomegaly, or guarding present.  Neurological: A&O x3, Strength is normal and symmetric bilaterally, cranial nerve II-XII are grossly intact, no focal motor deficit, sensory intact to light touch bilaterally.  Extremities : No Cyanosis, Clubbing or Edema  Labs on Admission:  Basic Metabolic Panel:  Recent Labs Lab 05/15/14 2209  NA 140  K 3.8  CL 103  CO2 29  GLUCOSE 108*  BUN 12  CREATININE 0.80  CALCIUM 9.5   Liver Function Tests: No results for input(s): AST, ALT, ALKPHOS, BILITOT, PROT, ALBUMIN in the last 168  hours. No results for input(s): LIPASE, AMYLASE in the last 168 hours. No results for input(s): AMMONIA in the last 168 hours. CBC:  Recent Labs Lab 05/15/14 2209  WBC 10.6*  HGB 15.4  HCT 45.0  MCV 93.8  PLT 312   Cardiac Enzymes: No results for input(s): CKTOTAL, CKMB, CKMBINDEX, TROPONINI in the last 168 hours.  BNP (last 3 results) No results for input(s): BNP in the last 8760 hours.  ProBNP (last 3 results) No results for input(s): PROBNP in the last 8760 hours.  CBG: No results for input(s): GLUCAP in the last 168 hours.  Radiological Exams on Admission: Dg Chest 2 View  05/15/2014   CLINICAL DATA:  Shortness of breath for 1 day worse in past few hours, vomiting, diarrhea for 3 days, history COPD, former smoker  EXAM: CHEST  2 VIEW  COMPARISON:  02/24/2014  FINDINGS: Normal heart size, mediastinal contours, and pulmonary vascularity.  Emphysematous and minimal bronchitic changes consistent with COPD.  Postsurgical changes in the upper lobes bilaterally and at  minor fissure.  Questionable vague nodular density in the lateral mid LEFT lung.  Remaining lungs clear.  No infiltrate, pleural effusion or pneumothorax.  Bones demineralized.  IMPRESSION: COPD changes with scattered areas of scarring and postsurgical change.  Questionable vague nodular density lateral LEFT mid lung; CT chest recommended to exclude pulmonary nodule.   Electronically Signed   By: Lavonia Dana M.D.   On: 05/15/2014 21:43    EKG: Independently reviewed.    Assessment/Plan Active Problems:   COPD exacerbation   Nausea and vomiting  COPD exacerbation We'll admit the patient, start Solu-Medrol 60 minute grams IV every 6 hours, Levaquin, Mucinex 1 tab by mouth twice a day, DuoNeb nebulizers every 6 hours with albuterol every 2 hours when necessary  Chronic back pain Patient is on high-dose opiates at home, will continue MS Contin 60 mg by mouth 3 times a day. Will start IV fentanyl 25 g every 4 hours  when necessary for breakthrough pain.  Nausea and vomiting ? Cause, likely viral. Will start Zofran when necessary for nausea vomiting.  ? Pulmonary nodule Chest x-ray shows questionable pulmonary nodule, recommending CT chest. Consider CT chest as outpatient.  Code status: Full code  Family discussion: No family at bedside   Time Spent on Admission: 44 minutes  LAMA,GAGAN S [facility] Pager: [phone] 05/16/2014, 3:42 AM  If 7PM-7AM, please contact night-coverage  [email or web address]  Password [email or web address]

## 2014-05-16 NOTE — Progress Notes (Signed)
ANTIBIOTIC CONSULT NOTE - FOLLOW UP  Pharmacy Consult for Levaquin Indication: Bronchitis  Allergies  Allergen Reactions  . Ambien [Zolpidem Tartrate] Other (See Comments)    hallucinations  . Melatonin Itching    Patient Measurements: Height: 6\' 1"  (185.4 cm) Weight: 178 lb 2.1 oz (80.8 kg) IBW/kg (Calculated) : 79.9  Vital Signs: Temp: 97.9 F (36.6 C) (03/12 0514) Temp Source: Oral (03/12 0514) BP: 94/39 mmHg (03/12 0514) Pulse Rate: 90 (03/12 0514) Intake/Output from previous day: 03/11 0701 - 03/12 0700 In: -  Out: 620 [Urine:620] Intake/Output from this shift:    Labs:  Recent Labs  05/15/14 2209 05/16/14 0722  WBC 10.6* 7.7  HGB 15.4 13.6  PLT 312 291  CREATININE 0.80 0.83   Estimated Creatinine Clearance: 119 mL/min (by C-G formula based on Cr of 0.83). No results for input(s): VANCOTROUGH, VANCOPEAK, VANCORANDOM, GENTTROUGH, GENTPEAK, GENTRANDOM, TOBRATROUGH, TOBRAPEAK, TOBRARND, AMIKACINPEAK, AMIKACINTROU, AMIKACIN in the last 72 hours.   Microbiology: Recent Results (from the past 720 hour(s))  Blood culture (routine x 2)     Status: None (Preliminary result)   Collection Time: 05/16/14 12:40 AM  Result Value Ref Range Status   Specimen Description LEFT ANTECUBITAL  Final   Special Requests BOTTLES DRAWN AEROBIC AND ANAEROBIC 6CC  Final   Culture PENDING  Incomplete   Report Status PENDING  Incomplete  Blood culture (routine x 2)     Status: None (Preliminary result)   Collection Time: 05/16/14 12:45 AM  Result Value Ref Range Status   Specimen Description BLOOD LEFT HAND  Final   Special Requests BOTTLES DRAWN AEROBIC ONLY 6CC  Final   Culture PENDING  Incomplete   Report Status PENDING  Incomplete    Anti-infectives    Start     Dose/Rate Route Frequency Ordered Stop   05/15/14 2330  levofloxacin (LEVAQUIN) IVPB 750 mg     750 mg 100 mL/hr over 90 Minutes Intravenous  Once 05/15/14 2326 05/16/14 0227      Assessment: OK for protocol.   Initial dose given.  Goal of Therapy:  Eradicate infection.   Plan:  Levaquin 750mg  IV every 24 hours. Monitor labs, micro, vitals.  Biagio Quint R 05/16/2014,9:15 AM

## 2014-05-16 NOTE — Progress Notes (Signed)
Patient ID: Wesley [name]  male  [id number]:[id number]    DOB: 06-Dec-1965    DOA: 05/15/2014  PCP: Wesley Burly, MD    Brief history of present illness  49 year old male who  has a past medical history of COPD (chronic obstructive pulmonary disease); Osteoporosis; Neuropathy; Back pain; Depression; and Avascular necrosis of femur. The patient presented with nausea and vomiting that started 3 days ago. Patient reported that he's been vomiting 4-5 times every day. Unable take anything by mouth. Patient also noticed worsening shortness of breath he has home oxygen. Patient has a history of COPD and asbestosis and lung and had surgery in June 2015 at [facility]. He denied chest pains, admits to having abdominal pain from coughing. Chest x-ray shows COPD and questionable pulmonary nodule   Assessment/Plan: Principal Problem:  Acute on chronic respiratory failure with COPD exacerbation- still wheezing - Continue scheduled nebs, Symbicort, IV Levaquin, IV Solu-Medrol - Continue antitussives, IV fluids, flutter valve - Rule out influenza given his other symptoms of nausea and vomiting, follow blood cultures, sputum cultures  Active Problems:   Dehydration - Continue IV fluid hydration  Nausea and vomiting - Possibly viral gastroenteritis however rule out influenza, influenza PCR ordered - Continue supportive treatment, IV fluids, pain control, and Zofran    Chronic pain with opioid dependence - Continue home medications, IV morphine as needed  Vague nodular density left lateral mid lung, ? pulmonary nodule - Chest x-ray shows questionable pulmonary nodule recommending CT chest to exclude pulmonary nodule. - Deferred to PCP for further workup of pulmonary nodule given patient's history of smoking.      Tobacco abuse - Placed on NicoDerm patch - Counseled on smoking cessation   DVT Prophylaxis:  Code Status: Full code  Family Communication: Discussed in detail with the patient,  all imaging results, lab results explained to the patient    Disposition:  Consultants:  none  Procedures:  CXR  Antibiotics:  IV Levaquin    Subjective: Patient seen and examined, complaining of pain, chronic pain and takes opioids at home. Otherwise still wheezing, states that he takes usually 3-4 days to get better. Has home O2.  Objective: Weight change:   Intake/Output Summary (Last 24 hours) at 05/16/14 0753 Last data filed at 05/16/14 0244  Gross per 24 hour  Intake      0 ml  Output    620 ml  Net   -620 ml   Blood pressure 94/39, pulse 90, temperature 97.9 F (36.6 C), temperature source Oral, resp. rate 20, height 6\' 1"  (1.854 m), weight 80.8 kg (178 lb 2.1 oz), SpO2 96 %.  Physical Exam: General: Alert and awake, oriented x3, not in any acute distress. HEENT: anicteric sclera, PERLA, EOMI CVS: S1-S2 clear, no murmur rubs or gallops Chest: Bilateral diffuse expiratory wheezing Abdomen: soft nontender, nondistended, normal bowel sounds  Extremities: no cyanosis, clubbing or edema noted bilaterally Neuro: Cranial nerves II-XII intact, no focal neurological deficits  Lab Results: Basic Metabolic Panel:  Recent Labs Lab 05/15/14 2209  NA 140  K 3.8  CL 103  CO2 29  GLUCOSE 108*  BUN 12  CREATININE 0.80  CALCIUM 9.5   Liver Function Tests: No results for input(s): AST, ALT, ALKPHOS, BILITOT, PROT, ALBUMIN in the last 168 hours. No results for input(s): LIPASE, AMYLASE in the last 168 hours. No results for input(s): AMMONIA in the last 168 hours. CBC:  Recent Labs Lab 05/15/14 2209  WBC 10.6*  HGB 15.4  HCT 45.0  MCV 93.8  PLT 312   Cardiac Enzymes: No results for input(s): CKTOTAL, CKMB, CKMBINDEX, TROPONINI in the last 168 hours. BNP: Invalid input(s): POCBNP CBG: No results for input(s): GLUCAP in the last 168 hours.   Micro Results: Recent Results (from the past 240 hour(s))  Blood culture (routine x 2)     Status: None  (Preliminary result)   Collection Time: 05/16/14 12:40 AM  Result Value Ref Range Status   Specimen Description LEFT ANTECUBITAL  Final   Special Requests BOTTLES DRAWN AEROBIC AND ANAEROBIC 6CC  Final   Culture PENDING  Incomplete   Report Status PENDING  Incomplete  Blood culture (routine x 2)     Status: None (Preliminary result)   Collection Time: 05/16/14 12:45 AM  Result Value Ref Range Status   Specimen Description BLOOD LEFT HAND  Final   Special Requests BOTTLES DRAWN AEROBIC ONLY [place]  Final   Culture PENDING  Incomplete   Report Status PENDING  Incomplete    Studies/Results: Dg Chest 2 View  05/15/2014   CLINICAL DATA:  Shortness of breath for 1 day worse in past few hours, vomiting, diarrhea for 3 days, history COPD, former smoker  EXAM: CHEST  2 VIEW  COMPARISON:  02/24/2014  FINDINGS: Normal heart size, mediastinal contours, and pulmonary vascularity.  Emphysematous and minimal bronchitic changes consistent with COPD.  Postsurgical changes in the upper lobes bilaterally and at minor fissure.  Questionable vague nodular density in the lateral mid LEFT lung.  Remaining lungs clear.  No infiltrate, pleural effusion or pneumothorax.  Bones demineralized.  IMPRESSION: COPD changes with scattered areas of scarring and postsurgical change.  Questionable vague nodular density lateral LEFT mid lung; CT chest recommended to exclude pulmonary nodule.   Electronically Signed   By: Wesley [name] M.D.   On: 05/15/2014 21:43    Medications: Scheduled Meds: . antiseptic oral rinse  7 mL Mouth Rinse BID  . budesonide-formoterol  3 puff Inhalation QID  . enoxaparin (LOVENOX) injection  40 mg Subcutaneous Q24H  . gabapentin  300 mg Oral 3 times per day  . guaiFENesin  600 mg Oral BID  . ipratropium-albuterol  3 mL Nebulization Q4H  . methylPREDNISolone (SOLU-MEDROL) injection  60 mg Intravenous Q6H  . morphine  30 mg Oral TID  . nicotine  14 mg Transdermal Daily  . sodium chloride  3 mL  Intravenous Q12H   Time spent 25 minutes   LOS: 0 days   Wesley [name] M.D. [facility] 05/16/2014, 7:53 AM Pager: [phone]  If 7PM-7AM, please contact night-coverage [email or web address] Password [email or web address]

## 2014-05-17 LAB — BASIC METABOLIC PANEL
Anion gap: 6 (ref 5–15)
BUN: 11 mg/dL (ref 6–23)
CALCIUM: 8.9 mg/dL (ref 8.4–10.5)
CO2: 26 mmol/L (ref 19–32)
Chloride: 109 mmol/L (ref 96–112)
Creatinine, Ser: 0.78 mg/dL (ref 0.50–1.35)
GFR calc non Af Amer: >90 mL/min (ref >90)
GLUCOSE: 149 mg/dL — AB (ref 70–99)
Potassium: 4 mmol/L (ref 3.5–5.1)
SODIUM: 141 mmol/L (ref 135–145)

## 2014-05-17 LAB — CBC
HCT: 37.8 % — ABNORMAL LOW (ref 39.0–52.0)
HEMOGLOBIN: 12.2 g/dL — AB (ref 13.0–17.0)
MCH: 30.7 pg (ref 26.0–34.0)
MCHC: 32.3 g/dL (ref 30.0–36.0)
MCV: 95.2 fL (ref 78.0–100.0)
PLATELETS: 301 10*3/uL (ref 150–400)
RBC: 3.97 MIL/uL — ABNORMAL LOW (ref 4.22–5.81)
RDW: 12.4 % (ref 11.5–15.5)
WBC: 13.8 10*3/uL — ABNORMAL HIGH (ref 4.0–10.5)

## 2014-05-17 MED ORDER — HYDROMORPHONE HCL 1 MG/ML IJ SOLN
1.0000 mg | Freq: Once | INTRAMUSCULAR | Status: AC
Start: 2014-05-17 — End: 2014-05-17
  Administered 2014-05-17: 1 mg via INTRAVENOUS
  Filled 2014-05-17: qty 1

## 2014-05-17 MED ORDER — PROMETHAZINE HCL 25 MG/ML IJ SOLN
12.5000 mg | Freq: Four times a day (QID) | INTRAMUSCULAR | Status: DC | PRN
  Administered 2014-05-17 – 2014-05-18 (×5): 12.5 mg via INTRAVENOUS
  Filled 2014-05-17 (×5): qty 1

## 2014-05-17 MED ORDER — MORPHINE SULFATE 2 MG/ML IJ SOLN
1.0000 mg | INTRAMUSCULAR | Status: DC | PRN
  Administered 2014-05-17 – 2014-05-18 (×10): 1 mg via INTRAVENOUS
  Filled 2014-05-17 (×10): qty 1

## 2014-05-17 NOTE — Plan of Care (Signed)
Problem: Phase I Progression Outcomes Goal: Pain controlled with appropriate interventions Outcome: Not Progressing Patient has a high tolerance of pain.  He requires pain medication multiple times per day.

## 2014-05-17 NOTE — Progress Notes (Signed)
Patient ID: Wesley [name]  male  DVV:[id number]    DOB: 1965-04-11    DOA: 05/15/2014  PCP: [name], MD    Brief history of present illness  49 year old male who  has a past medical history of COPD (chronic obstructive pulmonary disease); Osteoporosis; Neuropathy; Back pain; Depression; and Avascular necrosis of femur. The patient presented with nausea and vomiting that started 3 days ago. Patient reported that he's been vomiting 4-5 times every day. Unable take anything by mouth. Patient also noticed worsening shortness of breath he has home oxygen. Patient has a history of COPD and asbestosis and lung and had surgery in June 2015 at [facility]. He denied chest pains, admits to having abdominal pain from coughing. Chest x-ray shows COPD and questionable pulmonary nodule   Assessment/Plan: Principal Problem:  Acute on chronic respiratory failure with COPD exacerbation- still wheezing - Continue scheduled nebs, Symbicort, IV Levaquin, IV Solu-Medrol - Continue antitussives, IV fluids, flutter valve - Flu PCR negative  Active Problems:   Dehydration - Continue IV fluid hydration  Nausea and vomiting - Possibly viral gastroenteritis. - Continue supportive treatment, IV fluids, pain control, and Zofran    Chronic pain with opioid dependence - Continue home medications, IV morphine as needed  Vague nodular density left lateral mid lung, ? pulmonary nodule - Chest x-ray shows questionable pulmonary nodule recommending CT chest to exclude pulmonary nodule. - Deferred to PCP for further workup of pulmonary nodule given patient's history of smoking.      Tobacco abuse - Placed on NicoDerm patch - Counseled on smoking cessation   DVT Prophylaxis:  Code Status: Full code  Family Communication: Discussed in detail with the patient, all imaging results, lab results explained to the patient     Disposition:  Consultants:  none  Procedures:  CXR  Antibiotics:  IV Levaquin    Subjective: Patient seen and examined, complaining of pain, chronic pain and takes opioids at home. Otherwise still wheezing, states that he takes usually 3-4 days to get better. Has home O2.  Objective: Weight change:   Intake/Output Summary (Last 24 hours) at 05/17/14 1535 Last data filed at 05/17/14 1200  Gross per 24 hour  Intake    720 ml  Output      0 ml  Net    720 ml   Blood pressure 107/51, pulse 99, temperature 98.2 F (36.8 C), temperature source Oral, resp. rate 20, height 6\' 1"  (1.854 m), weight 80.8 kg (178 lb 2.1 oz), SpO2 97 %.  Physical Exam: General: Alert and awake, oriented x3, not in any acute distress. HEENT: anicteric sclera, PERLA, EOMI CVS: S1-S2 clear, no murmur rubs or gallops Chest: Bilateral diffuse expiratory wheezing Abdomen: soft nontender, nondistended, normal bowel sounds  Extremities: no cyanosis, clubbing or edema noted bilaterally Neuro: Cranial nerves II-XII intact, no focal neurological deficits  Lab Results: Basic Metabolic Panel:  Recent Labs Lab 05/16/14 0722 05/17/14 0625  NA 140 141  K 4.0 4.0  CL 109 109  CO2 25 26  GLUCOSE 174* 149*  BUN 11 11  CREATININE 0.83 0.78  CALCIUM 8.9 8.9   Liver Function Tests:  Recent Labs Lab 05/16/14 0722  AST 23  ALT 18  ALKPHOS 73  BILITOT 0.5  PROT 6.9  ALBUMIN 3.8   No results for input(s): LIPASE, AMYLASE in the last 168 hours. No results for input(s): AMMONIA in the last 168 hours. CBC:  Recent Labs Lab 05/16/14 0722 05/17/14 0625  WBC  7.7 13.8*  HGB 13.6 12.2*  HCT 40.8 37.8*  MCV 92.9 95.2  PLT 291 301   Cardiac Enzymes: No results for input(s): CKTOTAL, CKMB, CKMBINDEX, TROPONINI in the last 168 hours. BNP: Invalid input(s): POCBNP CBG: No results for input(s): GLUCAP in the last 168 hours.   Micro Results: Recent Results (from the past 240 hour(s))   Blood culture (routine x 2)     Status: None (Preliminary result)   Collection Time: 05/16/14 12:40 AM  Result Value Ref Range Status   Specimen Description LEFT ANTECUBITAL  Final   Special Requests BOTTLES DRAWN AEROBIC AND ANAEROBIC 6CC  Final   Culture NO GROWTH 1 DAY  Final   Report Status PENDING  Incomplete  Blood culture (routine x 2)     Status: None (Preliminary result)   Collection Time: 05/16/14 12:45 AM  Result Value Ref Range Status   Specimen Description BLOOD LEFT HAND  Final   Special Requests BOTTLES DRAWN AEROBIC ONLY 6CC  Final   Culture NO GROWTH 1 DAY  Final   Report Status PENDING  Incomplete  Culture, expectorated sputum-assessment     Status: None   Collection Time: 05/16/14  2:54 PM  Result Value Ref Range Status   Specimen Description SPUTUM  Final   Special Requests NONE  Final   Sputum evaluation   Final    THIS SPECIMEN IS ACCEPTABLE. RESPIRATORY CULTURE REPORT TO FOLLOW.   Report Status 05/16/2014 FINAL  Final  Culture, respiratory (NON-Expectorated)     Status: None (Preliminary result)   Collection Time: 05/16/14  2:54 PM  Result Value Ref Range Status   Specimen Description SPUTUM  Final   Special Requests NONE  Final   Gram Stain PENDING  Incomplete   Culture   Final    Culture reincubated for better growth Performed at [facility]    Report Status PENDING  Incomplete    Studies/Results: Dg Chest 2 View  05/15/2014   CLINICAL DATA:  Shortness of breath for 1 day worse in past few hours, vomiting, diarrhea for 3 days, history COPD, former smoker  EXAM: CHEST  2 VIEW  COMPARISON:  02/24/2014  FINDINGS: Normal heart size, mediastinal contours, and pulmonary vascularity.  Emphysematous and minimal bronchitic changes consistent with COPD.  Postsurgical changes in the upper lobes bilaterally and at minor fissure.  Questionable vague nodular density in the lateral mid LEFT lung.  Remaining lungs clear.  No infiltrate, pleural effusion or  pneumothorax.  Bones demineralized.  IMPRESSION: COPD changes with scattered areas of scarring and postsurgical change.  Questionable vague nodular density lateral LEFT mid lung; CT chest recommended to exclude pulmonary nodule.   Electronically Signed   By: Lavonia Dana M.D.   On: 05/15/2014 21:43    Medications: Scheduled Meds: . antiseptic oral rinse  7 mL Mouth Rinse BID  . budesonide-formoterol  3 puff Inhalation QID  . enoxaparin (LOVENOX) injection  40 mg Subcutaneous Q24H  . gabapentin  300 mg Oral 3 times per day  . guaiFENesin  600 mg Oral BID  . ipratropium-albuterol  3 mL Nebulization Q4H  . levofloxacin (LEVAQUIN) IV  750 mg Intravenous Q24H  . methylPREDNISolone (SOLU-MEDROL) injection  60 mg Intravenous Q6H  . morphine  30 mg Oral TID  . nicotine  14 mg Transdermal Daily  . sodium chloride  3 mL Intravenous Q12H   Time spent 25 minutes   LOS: 1 day   [name] M.D. [facility] 05/17/2014, 3:35 PM  Pager: [phone]  If 7PM-7AM, please contact night-coverage [email or web address] Password [email or web address]

## 2014-05-18 DIAGNOSIS — G8929 Other chronic pain: Secondary | ICD-10-CM

## 2014-05-18 DIAGNOSIS — R911 Solitary pulmonary nodule: Secondary | ICD-10-CM

## 2014-05-18 LAB — BASIC METABOLIC PANEL
Anion gap: 5 (ref 5–15)
BUN: 11 mg/dL (ref 6–23)
CALCIUM: 9.1 mg/dL (ref 8.4–10.5)
CHLORIDE: 107 mmol/L (ref 96–112)
CO2: 30 mmol/L (ref 19–32)
CREATININE: 0.7 mg/dL (ref 0.50–1.35)
GFR calc Af Amer: >90 mL/min (ref >90)
GFR calc non Af Amer: >90 mL/min (ref >90)
GLUCOSE: 142 mg/dL — AB (ref 70–99)
Potassium: 4.4 mmol/L (ref 3.5–5.1)
Sodium: 142 mmol/L (ref 135–145)

## 2014-05-18 MED ORDER — LEVOFLOXACIN 750 MG PO TABS: 750.0000 mg | ORAL_TABLET | Freq: Every day | ORAL | Status: DC

## 2014-05-18 MED ORDER — ONDANSETRON 4 MG PO TBDP: 4.0000 mg | ORAL_TABLET | Freq: Three times a day (TID) | ORAL | Status: DC | PRN

## 2014-05-18 MED ORDER — LEVOFLOXACIN 500 MG PO TABS: 500.0000 mg | ORAL_TABLET | Freq: Every day | ORAL | Status: DC

## 2014-05-18 MED ORDER — PREDNISONE 10 MG PO TABS: 10.0000 mg | ORAL_TABLET | Freq: Every day | ORAL | Status: DC

## 2014-05-18 NOTE — Discharge Summary (Signed)
Physician Discharge Summary  Wesley [name] [id number]:[id number] DOB: October 18, 1965 DOA: 05/15/2014  PCP: [name], MD  Admit date: 05/15/2014 Discharge date: 05/18/2014  Time spent: 45 minutes  Recommendations for Outpatient Follow-up:  -Will be discharged home today. -Advised to follow up with PCP in 2 weeks. -Exhibits drug-seeking tendencies and I have not prescribed any narcotics on DC. -PCP to work up solitary pulmonary nodule as an OP.   Discharge Diagnoses:  Principal Problem:   COPD exacerbation Active Problems:   Dehydration   Chronic pain   Tobacco abuse   Opiate dependence   Chronic respiratory failure   Nausea and vomiting   Solitary pulmonary nodule   Discharge Condition: Stable and improved  Filed Weights   05/15/14 2007 05/16/14 0417  Weight: 79.379 kg (175 lb) 80.8 kg (178 lb 2.1 oz)    History of present illness:  49 year old male who  has a past medical history of COPD (chronic obstructive pulmonary disease); Osteoporosis; Neuropathy; Back pain; Depression; and Avascular necrosis of femur. Today presents to the hospital with chief complaint of nausea and vomiting was started 3 days ago. Patient says that he's been vomiting 4-5 times every day. Unable take anything by mouth. Patient also has noticed worsening shortness of breath he has home oxygen. Patient has a history of COPD and asbestosis and lung and had surgery in June 2015 at [facility]. He denies chest pains, admits to having abdominal pain from coughing. Chest x-ray shows COPD and questionable pulmonary nodule  Hospital Course:   Acute on chronic respiratory failure with COPD exacerbation -Clinically improved - Dc on steroids, inhalers. - Flu PCR negative   Dehydration - Resolved with IVF  Nausea and vomiting - Possibly viral gastroenteritis. - Patient states he continued to vomit, however this has not been witnessed by staff and he has been consuming 100% of his meals.   Chronic  pain with opioid dependence - Continue home medications,. -Exhibits drug-seeking behavior. Have explained that no narcotics will be prescribed on DC. -If/when he returns to the hospital, his narcotic expectations will have to be managed from the beginning.  Vague nodular density left lateral mid lung, ? pulmonary nodule - Chest x-ray shows questionable pulmonary nodule recommending CT chest to exclude pulmonary nodule. - Deferred to PCP for further workup of pulmonary nodule given patient's history of smoking.    Tobacco abuse - Placed on NicoDerm patch - Counseled on smoking cessation  Procedures:  None   Consultations:  None  Discharge Instructions      Discharge Instructions    Diet - low sodium heart healthy    Complete by:  As directed      Increase activity slowly    Complete by:  As directed             Medication List    STOP taking these medications        EVZIO 0.4 MG/0.4ML Soaj  Generic drug:  Naloxone HCl     furosemide 40 MG tablet  Commonly known as:  LASIX     HYDROcodone-homatropine 5-1.5 MG/5ML syrup  Commonly known as:  HYCODAN      TAKE these medications        albuterol 108 (90 BASE) MCG/ACT inhaler  Commonly known as:  PROVENTIL HFA;VENTOLIN HFA  Inhale 2 puffs into the lungs every 4 (four) hours as needed for wheezing or shortness of breath. Shortness of breath     albuterol (2.5 MG/3ML) 0.083% nebulizer solution  Commonly  known as:  PROVENTIL  Take 3 mLs (2.5 mg total) by nebulization every 6 (six) hours as needed for wheezing or shortness of breath.     budesonide-formoterol 160-4.5 MCG/ACT inhaler  Commonly known as:  SYMBICORT  Inhale 3 puffs into the lungs 4 (four) times daily.     gabapentin 300 MG capsule  Commonly known as:  NEURONTIN  Take 300 mg by mouth every 8 (eight) hours.     levofloxacin 500 MG tablet  Commonly known as:  LEVAQUIN  Take 1 tablet (500 mg total) by mouth daily at 6 PM.     morphine 30 MG 12 hr  tablet  Commonly known as:  MS CONTIN  Take 30 mg by mouth 3 (three) times daily.     nicotine 14 mg/24hr patch  Commonly known as:  NICODERM CQ - dosed in mg/24 hours  Place 14 mg onto the skin daily.     ondansetron 4 MG disintegrating tablet  Commonly known as:  ZOFRAN ODT  Take 1 tablet (4 mg total) by mouth every 8 (eight) hours as needed for nausea or vomiting.     oxyCODONE 15 MG immediate release tablet  Commonly known as:  ROXICODONE  Take 30 mg by mouth every 4 (four) hours as needed for pain.     potassium chloride SA 20 MEQ tablet  Commonly known as:  K-DUR,KLOR-CON  Take 20 mEq by mouth daily.     predniSONE 10 MG tablet  Commonly known as:  DELTASONE  Take 1 tablet (10 mg total) by mouth daily with breakfast. Take 6 tablets today and decrease by 1 tablet daily until none are left.     senna 8.6 MG tablet  Commonly known as:  SENOKOT  Take 2 tablets by mouth 2 (two) times daily.     SYSTANE 0.4-0.3 % Soln  Generic drug:  Polyethyl Glycol-Propyl Glycol  Place 1-2 drops into the left eye daily as needed (for dry eye relief).     tiotropium 18 MCG inhalation capsule  Commonly known as:  SPIRIVA  Place 1 capsule (18 mcg total) into inhaler and inhale daily.       Allergies  Allergen Reactions  . Ambien [Zolpidem Tartrate] Other (See Comments)    hallucinations  . Melatonin Itching   Follow-up Information    Follow up with HASANAJ,XAJE A, MD. Schedule an appointment as soon as possible for a visit in 2 weeks.   Specialty:  Internal Medicine   Contact information:   [address] [place] [address] [id number] [phone]       Follow up with [facility].   Contact information:   [address] [place] [address] [phone]        The results of significant diagnostics from this hospitalization (including imaging, microbiology, ancillary and laboratory) are listed below for reference.    Significant Diagnostic Studies: Dg  Chest 2 View  05/15/2014   CLINICAL DATA:  Shortness of breath for 1 day worse in past few hours, vomiting, diarrhea for 3 days, history COPD, former smoker  EXAM: CHEST  2 VIEW  COMPARISON:  02/24/2014  FINDINGS: Normal heart size, mediastinal contours, and pulmonary vascularity.  Emphysematous and minimal bronchitic changes consistent with COPD.  Postsurgical changes in the upper lobes bilaterally and at minor fissure.  Questionable vague nodular density in the lateral mid LEFT lung.  Remaining lungs clear.  No infiltrate, pleural effusion or pneumothorax.  Bones demineralized.  IMPRESSION: COPD changes  with scattered areas of scarring and postsurgical change.  Questionable vague nodular density lateral LEFT mid lung; CT chest recommended to exclude pulmonary nodule.   Electronically Signed   By: Lavonia Dana M.D.   On: 05/15/2014 21:43    Microbiology: Recent Results (from the past 240 hour(s))  Blood culture (routine x 2)     Status: None (Preliminary result)   Collection Time: 05/16/14 12:40 AM  Result Value Ref Range Status   Specimen Description BLOOD LEFT ANTECUBITAL  Final   Special Requests BOTTLES DRAWN AEROBIC AND ANAEROBIC 6CC  Final   Culture NO GROWTH 2 DAYS  Final   Report Status PENDING  Incomplete  Blood culture (routine x 2)     Status: None (Preliminary result)   Collection Time: 05/16/14 12:45 AM  Result Value Ref Range Status   Specimen Description BLOOD LEFT HAND  Final   Special Requests BOTTLES DRAWN AEROBIC ONLY 6CC  Final   Culture NO GROWTH 2 DAYS  Final   Report Status PENDING  Incomplete  Culture, expectorated sputum-assessment     Status: None   Collection Time: 05/16/14  2:54 PM  Result Value Ref Range Status   Specimen Description SPUTUM  Final   Special Requests NONE  Final   Sputum evaluation   Final    THIS SPECIMEN IS ACCEPTABLE. RESPIRATORY CULTURE REPORT TO FOLLOW.   Report Status 05/16/2014 FINAL  Final  Culture, respiratory (NON-Expectorated)      Status: None (Preliminary result)   Collection Time: 05/16/14  2:54 PM  Result Value Ref Range Status   Specimen Description SPUTUM  Final   Special Requests NONE  Final   Gram Stain   Final    RARE WBC PRESENT, PREDOMINANTLY PMN RARE SQUAMOUS EPITHELIAL CELLS PRESENT RARE GRAM POSITIVE COCCI IN PAIRS IN CLUSTERS Performed at [facility]    Culture   Final    NORMAL OROPHARYNGEAL FLORA Performed at [facility]    Report Status PENDING  Incomplete     Labs: Basic Metabolic Panel:  Recent Labs Lab 05/15/14 2209 05/16/14 0722 05/17/14 0625 05/18/14 0657  NA 140 140 141 142  K 3.8 4.0 4.0 4.4  CL 103 109 109 107  CO2 29 25 26 30   GLUCOSE 108* 174* 149* 142*  BUN 12 11 11 11   CREATININE 0.80 0.83 0.78 0.70  CALCIUM 9.5 8.9 8.9 9.1   Liver Function Tests:  Recent Labs Lab 05/16/14 0722  AST 23  ALT 18  ALKPHOS 73  BILITOT 0.5  PROT 6.9  ALBUMIN 3.8   No results for input(s): LIPASE, AMYLASE in the last 168 hours. No results for input(s): AMMONIA in the last 168 hours. CBC:  Recent Labs Lab 05/15/14 2209 05/16/14 0722 05/17/14 0625  WBC 10.6* 7.7 13.8*  HGB 15.4 13.6 12.2*  HCT 45.0 40.8 37.8*  MCV 93.8 92.9 95.2  PLT 312 291 301   Cardiac Enzymes: No results for input(s): CKTOTAL, CKMB, CKMBINDEX, TROPONINI in the last 168 hours. BNP: BNP (last 3 results) No results for input(s): BNP in the last 8760 hours.  ProBNP (last 3 results) No results for input(s): PROBNP in the last 8760 hours.  CBG: No results for input(s): GLUCAP in the last 168 hours.     SignedLelon Frohlich  [facility] Pager: [phone] 05/18/2014, 5:46 PM

## 2014-05-18 NOTE — Progress Notes (Signed)
Patient states he came into ED with home medications and gave them to someone in ED.  They were not brought with the patient to the floor, nor sent home.   Patient is being discharged home today and requested his home medications.  I called pharmacy and they are not holding his home meds.  I contacted the ED and they do not have his home meds either.  Belva Chimes, [facility], was contacted and she is going to double check with the ED.  Lunette Stands was notified and talked to the patient.

## 2014-05-18 NOTE — Progress Notes (Signed)
PHARMACIST - PHYSICIAN COMMUNICATION DR:   Jerilee Hoh CONCERNING: Antibiotic IV to Oral Route Change Policy  RECOMMENDATION: This patient is receiving Levaquin by the intravenous route.  Based on criteria approved by the Pharmacy and Therapeutics Committee, the antibiotic(s) is/are being converted to the equivalent oral dose form(s).   DESCRIPTION: These criteria include:  Patient being treated for a respiratory tract infection, urinary tract infection, cellulitis or clostridium difficile associated diarrhea if on metronidazole  The patient is not neutropenic and does not exhibit a GI malabsorption state  The patient is eating (either orally or via tube) and/or has been taking other orally administered medications for a least 24 hours  The patient is improving clinically and has a Tmax < 100.5  If you have questions about this conversion, please contact the Pharmacy Department  [x]   [phone] )  [facility]stine Na []   [phone] )  [facility] Pontes  []   [phone] )  [facility] []   [phone] )  [place], PharmD

## 2014-05-18 NOTE — Progress Notes (Signed)
Patient discharged home today.  Patient was given discharge instructions, prescriptions, and care notes, verbalized understanding with no complaints or concerns voiced at this time.  IV was removed with catheter intact, no bleeding or complications.  Patient's home meds were not found in ED prior to discharge.  Cicely aware and to follow up.  Patient left unit in stable condition by staff member in a wheelchair.

## 2014-05-18 NOTE — Care Management Note (Signed)
    Page 1 of 1   05/18/2014     3:19:33 PM CARE MANAGEMENT NOTE 05/18/2014  Patient:  Wesley [name], Wesley [name]   Account Number:  [id number]  Date Initiated:  05/18/2014  Documentation initiated by:  Theophilus Kinds  Subjective/Objective Assessment:   Pt admitted from home with COPD. Pt lives with his wife and will return home. Pt has home O2 and neb machine for home use. Pt is indepdent with ADL's.     Action/Plan:   Pt discharged home today. Pt has requested [place] RN with [facility]. Romualdo Bolk of [facility] is aware and will collect pts information from the chart. [place] services to start within 48 hours of discharge. Pt and pts nurse aware of discharge arrangements.   Anticipated DC Date:  05/18/2014   Anticipated DC Plan:  [place]  CM consult      [facility]  HOME HEALTH   Choice offered to / List presented to:  C-1 Patient        [place] arranged  HH-1 RN  [place].   Status of service:  Completed, signed off Medicare Important Message given?  YES (If response is "NO", the following Medicare IM given date fields will be blank) Date Medicare IM given:  05/18/2014 Medicare IM given by:  Theophilus Kinds Date Additional Medicare IM given:   Additional Medicare IM given by:    Discharge Disposition:  [place]  Per UR Regulation:    If discussed at Long Length of Stay Meetings, dates discussed:    Comments:  05/18/14 [place], RN BSN CM

## 2014-05-19 LAB — CULTURE, RESPIRATORY

## 2014-05-19 LAB — CULTURE, RESPIRATORY W GRAM STAIN: Culture: NORMAL

## 2014-05-22 ENCOUNTER — Inpatient Hospital Stay (HOSPITAL_COMMUNITY)
Admission: EM | Admit: 2014-05-22 | Discharge: 2014-05-24 | DRG: 191 | Disposition: A | Discharge status: Home / Self Care | Payer: Medicare Other | Attending: Family Medicine | Admitting: Family Medicine

## 2014-05-22 ENCOUNTER — Emergency Department (HOSPITAL_COMMUNITY): Payer: Medicare Other

## 2014-05-22 ENCOUNTER — Encounter (HOSPITAL_COMMUNITY): Payer: Self-pay | Admitting: *Deleted

## 2014-05-22 DIAGNOSIS — J441 Chronic obstructive pulmonary disease with (acute) exacerbation: Principal | ICD-10-CM | POA: Diagnosis present

## 2014-05-22 DIAGNOSIS — M25559 Pain in unspecified hip: Secondary | ICD-10-CM | POA: Diagnosis present

## 2014-05-22 DIAGNOSIS — Z96642 Presence of left artificial hip joint: Secondary | ICD-10-CM | POA: Diagnosis present

## 2014-05-22 DIAGNOSIS — G8929 Other chronic pain: Secondary | ICD-10-CM | POA: Diagnosis present

## 2014-05-22 DIAGNOSIS — Z720 Tobacco use: Secondary | ICD-10-CM | POA: Diagnosis present

## 2014-05-22 DIAGNOSIS — Z809 Family history of malignant neoplasm, unspecified: Secondary | ICD-10-CM

## 2014-05-22 DIAGNOSIS — R1110 Vomiting, unspecified: Secondary | ICD-10-CM

## 2014-05-22 DIAGNOSIS — F17200 Nicotine dependence, unspecified, uncomplicated: Secondary | ICD-10-CM | POA: Diagnosis present

## 2014-05-22 DIAGNOSIS — Z833 Family history of diabetes mellitus: Secondary | ICD-10-CM

## 2014-05-22 DIAGNOSIS — J449 Chronic obstructive pulmonary disease, unspecified: Secondary | ICD-10-CM | POA: Diagnosis present

## 2014-05-22 DIAGNOSIS — R0602 Shortness of breath: Secondary | ICD-10-CM | POA: Diagnosis not present

## 2014-05-22 DIAGNOSIS — G894 Chronic pain syndrome: Secondary | ICD-10-CM | POA: Diagnosis present

## 2014-05-22 DIAGNOSIS — M810 Age-related osteoporosis without current pathological fracture: Secondary | ICD-10-CM | POA: Diagnosis present

## 2014-05-22 DIAGNOSIS — Z9981 Dependence on supplemental oxygen: Secondary | ICD-10-CM

## 2014-05-22 DIAGNOSIS — W19XXXA Unspecified fall, initial encounter: Secondary | ICD-10-CM | POA: Diagnosis present

## 2014-05-22 DIAGNOSIS — R52 Pain, unspecified: Secondary | ICD-10-CM

## 2014-05-22 DIAGNOSIS — R911 Solitary pulmonary nodule: Secondary | ICD-10-CM

## 2014-05-22 DIAGNOSIS — R197 Diarrhea, unspecified: Secondary | ICD-10-CM

## 2014-05-22 DIAGNOSIS — Y92009 Unspecified place in unspecified non-institutional (private) residence as the place of occurrence of the external cause: Secondary | ICD-10-CM

## 2014-05-22 DIAGNOSIS — R112 Nausea with vomiting, unspecified: Secondary | ICD-10-CM | POA: Diagnosis present

## 2014-05-22 DIAGNOSIS — F1110 Opioid abuse, uncomplicated: Secondary | ICD-10-CM | POA: Diagnosis present

## 2014-05-22 DIAGNOSIS — Z8249 Family history of ischemic heart disease and other diseases of the circulatory system: Secondary | ICD-10-CM

## 2014-05-22 DIAGNOSIS — J9611 Chronic respiratory failure with hypoxia: Secondary | ICD-10-CM | POA: Diagnosis present

## 2014-05-22 LAB — COMPREHENSIVE METABOLIC PANEL
ALK PHOS: 84 U/L (ref 39–117)
ALT: 57 U/L — AB (ref 0–53)
AST: 24 U/L (ref 0–37)
Albumin: 3.9 g/dL (ref 3.5–5.2)
Anion gap: 10 (ref 5–15)
BUN: 23 mg/dL (ref 6–23)
CHLORIDE: 101 mmol/L (ref 96–112)
CO2: 27 mmol/L (ref 19–32)
Calcium: 9.2 mg/dL (ref 8.4–10.5)
Creatinine, Ser: 0.85 mg/dL (ref 0.50–1.35)
GLUCOSE: 98 mg/dL (ref 70–99)
POTASSIUM: 4 mmol/L (ref 3.5–5.1)
Sodium: 138 mmol/L (ref 135–145)
Total Bilirubin: 0.3 mg/dL (ref 0.3–1.2)
Total Protein: 6.8 g/dL (ref 6.0–8.3)

## 2014-05-22 LAB — CULTURE, BLOOD (ROUTINE X 2)
CULTURE: NO GROWTH
Culture: NO GROWTH

## 2014-05-22 LAB — CBC
HCT: 45.3 % (ref 39.0–52.0)
HEMOGLOBIN: 15.4 g/dL (ref 13.0–17.0)
MCH: 31.8 pg (ref 26.0–34.0)
MCHC: 34 g/dL (ref 30.0–36.0)
MCV: 93.4 fL (ref 78.0–100.0)
Platelets: 329 10*3/uL (ref 150–400)
RBC: 4.85 MIL/uL (ref 4.22–5.81)
RDW: 12.8 % (ref 11.5–15.5)
WBC: 16.1 10*3/uL — ABNORMAL HIGH (ref 4.0–10.5)

## 2014-05-22 LAB — LIPASE, BLOOD: Lipase: 25 U/L (ref 11–59)

## 2014-05-22 MED ORDER — ONDANSETRON 8 MG PO TBDP: 8.0000 mg | ORAL_TABLET | Freq: Once | ORAL | Status: DC

## 2014-05-22 MED ORDER — METHYLPREDNISOLONE SODIUM SUCC 125 MG IJ SOLR
125.0000 mg | Freq: Once | INTRAMUSCULAR | Status: AC
  Administered 2014-05-22: 125 mg via INTRAVENOUS
  Filled 2014-05-22: qty 2

## 2014-05-22 MED ORDER — SODIUM CHLORIDE 0.9 % IV SOLN
1000.0000 mL | Freq: Once | INTRAVENOUS | Status: AC
  Administered 2014-05-22: 1000 mL via INTRAVENOUS

## 2014-05-22 MED ORDER — MORPHINE SULFATE 4 MG/ML IJ SOLN
4.0000 mg | Freq: Once | INTRAMUSCULAR | Status: AC
  Administered 2014-05-22: 4 mg via INTRAVENOUS
  Filled 2014-05-22: qty 1

## 2014-05-22 MED ORDER — SODIUM CHLORIDE 0.9 % IV SOLN
1000.0000 mL | INTRAVENOUS | Status: DC
  Administered 2014-05-23: 1000 mL via INTRAVENOUS

## 2014-05-22 MED ORDER — IPRATROPIUM-ALBUTEROL 0.5-2.5 (3) MG/3ML IN SOLN
3.0000 mL | Freq: Once | RESPIRATORY_TRACT | Status: AC
  Administered 2014-05-22: 3 mL via RESPIRATORY_TRACT
  Filled 2014-05-22: qty 3

## 2014-05-22 MED ORDER — PROMETHAZINE HCL 25 MG/ML IJ SOLN
12.5000 mg | Freq: Once | INTRAMUSCULAR | Status: AC
  Administered 2014-05-22: 12.5 mg via INTRAVENOUS
  Filled 2014-05-22: qty 1

## 2014-05-22 MED ORDER — ONDANSETRON HCL 4 MG/2ML IJ SOLN
4.0000 mg | Freq: Once | INTRAMUSCULAR | Status: AC
  Administered 2014-05-22: 4 mg via INTRAVENOUS

## 2014-05-22 MED ORDER — ONDANSETRON HCL 4 MG/2ML IJ SOLN
INTRAMUSCULAR | Status: AC
  Filled 2014-05-22: qty 2

## 2014-05-22 NOTE — ED Provider Notes (Signed)
CSN: [id number]     Arrival date & time 05/22/14  2022 History  This chart was scribed for Dorie Rank, MD by Tula Nakayama, ED Scribe. This patient was seen in room APA06/APA06 and the patient's care was started at 9:41 PM.    Chief Complaint  Patient presents with  . Shortness of Breath   The history is provided by the patient. No language interpreter was used.    HPI Comments: Wesley [name] is a 49 y.o. male with a history of COPD and neuropathy, who presents to the Emergency Department complaining of constant, moderate SOB that started 4 days ago. Pt states generalized weakness, nausea, vomiting and diarrhea as associated symptoms. He also reports moderate right hip pain after he fell today. Pt has not been able to tolerate PO intake for the last 4 days.  Pt was seen in the ED on 3/11 for the same symptoms and was hospitalized. He reports symptoms have improved, but became worse again after he was discharge on 3/14. Pt is on O2 at home. He denies smoking cigarettes.  Past Medical History  Diagnosis Date  . COPD (chronic obstructive pulmonary disease)   . Osteoporosis   . Neuropathy   . Back pain   . Depression   . Avascular necrosis of femur    Past Surgical History  Procedure Laterality Date  . Joint replacement    . Back surgery    . Lung surgery     Family History  Problem Relation Age of Onset  . Heart failure Father   . Diabetes Father   . Diabetes Mother   . Cancer Mother     male cancer   History  Substance Use Topics  . Smoking status: Current Every Day Smoker -- 1.00 packs/day    Last Attempt to Quit: 07/05/2011  . Smokeless tobacco: Not on file  . Alcohol Use: No    Review of Systems  Respiratory: Positive for shortness of breath and wheezing.   Gastrointestinal: Positive for nausea, vomiting and diarrhea.  Musculoskeletal: Positive for arthralgias.  Neurological: Positive for weakness.  All other systems reviewed and are negative.     Allergies   Ambien and Melatonin  Home Medications   Prior to Admission medications   Medication Sig Start Date End Date Taking? Authorizing Provider  albuterol (PROVENTIL HFA;VENTOLIN HFA) 108 (90 BASE) MCG/ACT inhaler Inhale 2 puffs into the lungs every 4 (four) hours as needed for wheezing or shortness of breath. Shortness of breath 01/06/12  Yes Delfina Redwood, MD  albuterol (PROVENTIL) (2.5 MG/3ML) 0.083% nebulizer solution Take 3 mLs (2.5 mg total) by nebulization every 6 (six) hours as needed for wheezing or shortness of breath. 02/27/14  Yes Kathie Dike, MD  budesonide-formoterol (SYMBICORT) 160-4.5 MCG/ACT inhaler Inhale 3 puffs into the lungs 4 (four) times daily.   Yes Historical Provider, MD  gabapentin (NEURONTIN) 300 MG capsule Take 300 mg by mouth 4 (four) times daily.    Yes Historical Provider, MD  morphine (MS CONTIN) 30 MG 12 hr tablet Take 30 mg by mouth 3 (three) times daily. 01/05/14  Yes Historical Provider, MD  nicotine (NICODERM CQ - DOSED IN MG/24 HOURS) 14 mg/24hr patch Place 14 mg onto the skin daily.   Yes Historical Provider, MD  ondansetron (ZOFRAN ODT) 4 MG disintegrating tablet Take 1 tablet (4 mg total) by mouth every 8 (eight) hours as needed for nausea or vomiting. 05/18/14  Yes Estela Leonie Green, MD  oxyCODONE (ROXICODONE) 15  MG immediate release tablet Take 30 mg by mouth every 4 (four) hours as needed for pain.  01/05/14  Yes Historical Provider, MD  Polyethyl Glycol-Propyl Glycol (SYSTANE) 0.4-0.3 % SOLN Place 1-2 drops into the left eye daily as needed (for dry eye relief).    Yes Historical Provider, MD  potassium chloride SA (K-DUR,KLOR-CON) 20 MEQ tablet Take 20 mEq by mouth daily.   Yes Historical Provider, MD  predniSONE (DELTASONE) 10 MG tablet Take 1 tablet (10 mg total) by mouth daily with breakfast. Take 6 tablets today and decrease by 1 tablet daily until none are left. 05/18/14  Yes Estela Leonie Green, MD  senna (SENOKOT) 8.6 MG tablet Take  2 tablets by mouth 2 (two) times daily.   Yes Historical Provider, MD  tiotropium (SPIRIVA) 18 MCG inhalation capsule Place 1 capsule (18 mcg total) into inhaler and inhale daily. 07/24/11  Yes Nimish Luther Parody, MD  levofloxacin (LEVAQUIN) 500 MG tablet Take 1 tablet (500 mg total) by mouth daily at 6 PM. Patient not taking: Reported on 05/22/2014 05/18/14   Erline Hau, MD   BP 104/45 mmHg  Pulse 87  Temp(Src) 98 F (36.7 C) (Oral)  Resp 17  Ht 6\' 1"  (1.854 m)  Wt 176 lb (79.833 kg)  BMI 23.23 kg/m2  SpO2 98% Physical Exam  Constitutional: He appears well-developed and well-nourished. No distress.  HENT:  Head: Normocephalic and atraumatic.  Right Ear: External ear normal.  Left Ear: External ear normal.  Eyes: Conjunctivae are normal. Right eye exhibits no discharge. Left eye exhibits no discharge. No scleral icterus.  Neck: Neck supple. No tracheal deviation present.  Cardiovascular: Normal rate, regular rhythm and intact distal pulses.   Pulmonary/Chest: Effort normal. No stridor. No respiratory distress. He has wheezes. He has no rales.  Abdominal: Soft. Bowel sounds are normal. He exhibits no distension. There is no tenderness. There is no rebound and no guarding.  Musculoskeletal: He exhibits no edema.       Right hip: He exhibits tenderness and bony tenderness.  Neurological: He is alert. He has normal strength. No cranial nerve deficit or sensory deficit. He exhibits normal muscle tone. He displays no seizure activity. Coordination normal.  Skin: Skin is warm and dry. No rash noted.  Psychiatric: He has a normal mood and affect.  Nursing note and vitals reviewed.   ED Course  Procedures  DIAGNOSTIC STUDIES: Oxygen Saturation is 98% on [place] 3L/min, adequate by my interpretation.    COORDINATION OF CARE: 9:57 PM Discussed treatment plan with pt at bedside and pt agreed to plan.   Labs Review Labs Reviewed  CBC - Abnormal; Notable for the following:    WBC  16.1 (*)    All other components within normal limits  COMPREHENSIVE METABOLIC PANEL - Abnormal; Notable for the following:    ALT 57 (*)    All other components within normal limits  LIPASE, BLOOD  URINALYSIS, ROUTINE W REFLEX MICROSCOPIC    Imaging Review Dg Chest 2 View  05/22/2014   CLINICAL DATA:  Shortness of breath  EXAM: CHEST  2 VIEW  COMPARISON:  05/15/2014, 02/24/2014  FINDINGS: Hyperinflation. Interstitial coarsening. Biapical suture again noted. Linear branching over the right apex may be suture versus a filter within the right brachiocephalic vein. Left mid lung peripheral nodular opacity again noted. No confluent airspace opacity, pleural effusion, or pneumothorax. Cardiomediastinal contours within normal range.  IMPRESSION: COPD.  No focal consolidation.  Nodular opacity projecting of the  periphery of the left mid lung persists. Favor nodular scarring. Chest CT again recommend to exclude a nodule.   Electronically Signed   By: Carlos Levering M.D.   On: 05/22/2014 21:53   Dg Abd 2 Views  05/22/2014   CLINICAL DATA:  Weakness, vomiting and dyspnea.  EXAM: ABDOMEN - 2 VIEW  COMPARISON:  06/14/2012  FINDINGS: The bowel gas pattern is normal. There is no evidence of free air. No biliary or urinary calculi are evident. There are severe chronic changes about the right hip which likely represent AVN with subchondral fracture and collapse, old.  IMPRESSION: Negative for bowel obstruction or perforation. Normal bowel gas pattern. Severe chronic changes about the right hip.   Electronically Signed   By: Andreas Newport M.D.   On: 05/22/2014 23:51   Dg Hip Unilat With Pelvis 2-3 Views Right  05/22/2014   CLINICAL DATA:  Weakness, multiple falls.  EXAM: RIGHT HIP (WITH PELVIS) 2-3 VIEWS  COMPARISON:  06/14/2012  FINDINGS: Deformity of the right femoral head. Left hip arthroplasty. No periprosthetic lucency about the visualized portion. Pubic rami intact. No displaced acute fracture  identified. Sacrum obscured by overlying bowel.  IMPRESSION: Right femoral head avascular necrosis with collapse.  No acute osseous finding.  Recommend MRI if concern for nondisplaced fracture or other radiographically occult pathology persists.   Electronically Signed   By: Carlos Levering M.D.   On: 05/22/2014 23:53    Medications  0.9 %  sodium chloride infusion (0 mLs Intravenous Stopped 05/22/14 2315)    Followed by  0.9 %  sodium chloride infusion (not administered)  albuterol (PROVENTIL) (2.5 MG/3ML) 0.083% nebulizer solution 5 mg (not administered)  ondansetron (ZOFRAN) injection 4 mg (4 mg Intravenous Given 05/22/14 2144)  0.9 %  sodium chloride infusion (1,000 mLs Intravenous New Bag/Given 05/22/14 2316)    Followed by  0.9 %  sodium chloride infusion (1,000 mLs Intravenous New Bag/Given 05/22/14 2317)  methylPREDNISolone sodium succinate (SOLU-MEDROL) 125 mg/2 mL injection 125 mg (125 mg Intravenous Given 05/22/14 2205)  ipratropium-albuterol (DUONEB) 0.5-2.5 (3) MG/3ML nebulizer solution 3 mL (3 mLs Nebulization Given 05/22/14 2215)  promethazine (PHENERGAN) injection 12.5 mg (12.5 mg Intravenous Given 05/22/14 2210)  morphine 4 MG/ML injection 4 mg (4 mg Intravenous Given 05/22/14 2344)     MDM   Final diagnoses:  Pain  Fall  COPD exacerbation  Vomiting and diarrhea    Pt has persistent wheezing despite treatment associated with his copd exacerbation.  Also having trouble with vomiting and diarrhea causing weakness and falls.  No acute fractures noted on xrays although limited evaluation because of his chronic condition..  Will admit for further treatment of his copd.  Continue IV fluids.    I personally performed the services described in this documentation, which was scribed in my presence.  The recorded information has been reviewed and is accurate.    Dorie Rank, MD 05/23/14 [phone]

## 2014-05-22 NOTE — ED Notes (Addendum)
Pt c/o sob and n/v/d since being discharged from here on Monday. Pt is on home o2 at 3L. Pt states he has generalized bodyaches and has fallen 3 times.

## 2014-05-23 ENCOUNTER — Encounter (HOSPITAL_COMMUNITY): Payer: Self-pay | Admitting: Internal Medicine

## 2014-05-23 ENCOUNTER — Inpatient Hospital Stay (HOSPITAL_COMMUNITY): Payer: Medicare Other

## 2014-05-23 DIAGNOSIS — J9611 Chronic respiratory failure with hypoxia: Secondary | ICD-10-CM | POA: Diagnosis present

## 2014-05-23 DIAGNOSIS — F1110 Opioid abuse, uncomplicated: Secondary | ICD-10-CM | POA: Diagnosis present

## 2014-05-23 DIAGNOSIS — M810 Age-related osteoporosis without current pathological fracture: Secondary | ICD-10-CM | POA: Diagnosis present

## 2014-05-23 DIAGNOSIS — R1110 Vomiting, unspecified: Secondary | ICD-10-CM

## 2014-05-23 DIAGNOSIS — Z9981 Dependence on supplemental oxygen: Secondary | ICD-10-CM | POA: Diagnosis not present

## 2014-05-23 DIAGNOSIS — Z833 Family history of diabetes mellitus: Secondary | ICD-10-CM | POA: Diagnosis not present

## 2014-05-23 DIAGNOSIS — Y92009 Unspecified place in unspecified non-institutional (private) residence as the place of occurrence of the external cause: Secondary | ICD-10-CM | POA: Diagnosis not present

## 2014-05-23 DIAGNOSIS — J441 Chronic obstructive pulmonary disease with (acute) exacerbation: Principal | ICD-10-CM

## 2014-05-23 DIAGNOSIS — W19XXXA Unspecified fall, initial encounter: Secondary | ICD-10-CM | POA: Diagnosis present

## 2014-05-23 DIAGNOSIS — Z809 Family history of malignant neoplasm, unspecified: Secondary | ICD-10-CM | POA: Diagnosis not present

## 2014-05-23 DIAGNOSIS — G894 Chronic pain syndrome: Secondary | ICD-10-CM | POA: Diagnosis present

## 2014-05-23 DIAGNOSIS — G8929 Other chronic pain: Secondary | ICD-10-CM

## 2014-05-23 DIAGNOSIS — R197 Diarrhea, unspecified: Secondary | ICD-10-CM

## 2014-05-23 DIAGNOSIS — J42 Unspecified chronic bronchitis: Secondary | ICD-10-CM

## 2014-05-23 DIAGNOSIS — Z8249 Family history of ischemic heart disease and other diseases of the circulatory system: Secondary | ICD-10-CM | POA: Diagnosis not present

## 2014-05-23 DIAGNOSIS — Z96642 Presence of left artificial hip joint: Secondary | ICD-10-CM | POA: Diagnosis present

## 2014-05-23 DIAGNOSIS — F17200 Nicotine dependence, unspecified, uncomplicated: Secondary | ICD-10-CM | POA: Diagnosis present

## 2014-05-23 DIAGNOSIS — R0602 Shortness of breath: Secondary | ICD-10-CM | POA: Diagnosis present

## 2014-05-23 DIAGNOSIS — R112 Nausea with vomiting, unspecified: Secondary | ICD-10-CM

## 2014-05-23 LAB — CBC
HCT: 40.6 % (ref 39.0–52.0)
Hemoglobin: 13.3 g/dL (ref 13.0–17.0)
MCH: 31 pg (ref 26.0–34.0)
MCHC: 32.8 g/dL (ref 30.0–36.0)
MCV: 94.6 fL (ref 78.0–100.0)
Platelets: 281 10*3/uL (ref 150–400)
RBC: 4.29 MIL/uL (ref 4.22–5.81)
RDW: 12.8 % (ref 11.5–15.5)
WBC: 18.8 10*3/uL — ABNORMAL HIGH (ref 4.0–10.5)

## 2014-05-23 LAB — BASIC METABOLIC PANEL
Anion gap: 7 (ref 5–15)
BUN: 18 mg/dL (ref 6–23)
CO2: 26 mmol/L (ref 19–32)
Calcium: 8.3 mg/dL — ABNORMAL LOW (ref 8.4–10.5)
Chloride: 105 mmol/L (ref 96–112)
Creatinine, Ser: 0.83 mg/dL (ref 0.50–1.35)
GFR calc Af Amer: >90 mL/min (ref >90)
GFR calc non Af Amer: >90 mL/min (ref >90)
Glucose, Bld: 152 mg/dL — ABNORMAL HIGH (ref 70–99)
Potassium: 4.5 mmol/L (ref 3.5–5.1)
Sodium: 138 mmol/L (ref 135–145)

## 2014-05-23 LAB — URINALYSIS, ROUTINE W REFLEX MICROSCOPIC
Bilirubin Urine: NEGATIVE
Glucose, UA: NEGATIVE mg/dL
Hgb urine dipstick: NEGATIVE
KETONES UR: NEGATIVE mg/dL
LEUKOCYTES UA: NEGATIVE
NITRITE: NEGATIVE
Protein, ur: NEGATIVE mg/dL
UROBILINOGEN UA: 0.2 mg/dL (ref 0.0–1.0)
pH: 6 (ref 5.0–8.0)

## 2014-05-23 MED ORDER — ONDANSETRON 4 MG PO TBDP
4.0000 mg | ORAL_TABLET | Freq: Three times a day (TID) | ORAL | Status: DC | PRN
  Administered 2014-05-23 – 2014-05-24 (×2): 4 mg via ORAL
  Filled 2014-05-23 (×2): qty 1

## 2014-05-23 MED ORDER — METHYLPREDNISOLONE SODIUM SUCC 40 MG IJ SOLR
40.0000 mg | Freq: Two times a day (BID) | INTRAMUSCULAR | Status: DC
  Administered 2014-05-24: 40 mg via INTRAVENOUS
  Filled 2014-05-23: qty 1

## 2014-05-23 MED ORDER — ALBUTEROL SULFATE (2.5 MG/3ML) 0.083% IN NEBU
5.0000 mg | INHALATION_SOLUTION | Freq: Once | RESPIRATORY_TRACT | Status: AC
  Administered 2014-05-23: 5 mg via RESPIRATORY_TRACT
  Filled 2014-05-23: qty 6

## 2014-05-23 MED ORDER — POTASSIUM CHLORIDE CRYS ER 20 MEQ PO TBCR
20.0000 meq | EXTENDED_RELEASE_TABLET | Freq: Every day | ORAL | Status: DC
  Administered 2014-05-23 – 2014-05-24 (×2): 20 meq via ORAL
  Filled 2014-05-23 (×2): qty 1

## 2014-05-23 MED ORDER — METHYLPREDNISOLONE SODIUM SUCC 40 MG IJ SOLR
40.0000 mg | Freq: Four times a day (QID) | INTRAMUSCULAR | Status: DC
  Administered 2014-05-23 (×2): 40 mg via INTRAVENOUS
  Filled 2014-05-23 (×2): qty 1

## 2014-05-23 MED ORDER — OXYCODONE HCL 5 MG PO TABS
30.0000 mg | ORAL_TABLET | ORAL | Status: DC | PRN
  Administered 2014-05-23 – 2014-05-24 (×6): 30 mg via ORAL
  Filled 2014-05-23 (×6): qty 6

## 2014-05-23 MED ORDER — HYDROMORPHONE HCL 1 MG/ML IJ SOLN
1.0000 mg | INTRAMUSCULAR | Status: DC | PRN
  Administered 2014-05-23 (×3): 1 mg via INTRAVENOUS
  Filled 2014-05-23 (×3): qty 1

## 2014-05-23 MED ORDER — SODIUM CHLORIDE 0.9 % IJ SOLN
3.0000 mL | Freq: Two times a day (BID) | INTRAMUSCULAR | Status: DC
  Administered 2014-05-23 (×2): 3 mL via INTRAVENOUS

## 2014-05-23 MED ORDER — CETYLPYRIDINIUM CHLORIDE 0.05 % MT LIQD
7.0000 mL | Freq: Two times a day (BID) | OROMUCOSAL | Status: DC
  Administered 2014-05-23: 7 mL via OROMUCOSAL

## 2014-05-23 MED ORDER — ONDANSETRON HCL 4 MG/2ML IJ SOLN
INTRAMUSCULAR | Status: AC
  Filled 2014-05-23: qty 2

## 2014-05-23 MED ORDER — ONDANSETRON HCL 4 MG/2ML IJ SOLN
4.0000 mg | Freq: Once | INTRAMUSCULAR | Status: AC
  Administered 2014-05-23: 4 mg via INTRAVENOUS

## 2014-05-23 MED ORDER — GABAPENTIN 300 MG PO CAPS
600.0000 mg | ORAL_CAPSULE | Freq: Three times a day (TID) | ORAL | Status: DC
  Administered 2014-05-23 – 2014-05-24 (×2): 600 mg via ORAL
  Filled 2014-05-23 (×2): qty 2

## 2014-05-23 MED ORDER — TIOTROPIUM BROMIDE MONOHYDRATE 18 MCG IN CAPS
18.0000 ug | ORAL_CAPSULE | Freq: Every day | RESPIRATORY_TRACT | Status: DC
  Administered 2014-05-23 – 2014-05-24 (×2): 18 ug via RESPIRATORY_TRACT
  Filled 2014-05-23: qty 5

## 2014-05-23 MED ORDER — NICOTINE 14 MG/24HR TD PT24
14.0000 mg | MEDICATED_PATCH | Freq: Every day | TRANSDERMAL | Status: DC
  Administered 2014-05-23: 14 mg via TRANSDERMAL
  Filled 2014-05-23 (×2): qty 1

## 2014-05-23 MED ORDER — HEPARIN SODIUM (PORCINE) 5000 UNIT/ML IJ SOLN
5000.0000 [IU] | Freq: Three times a day (TID) | INTRAMUSCULAR | Status: DC
Start: 2014-05-23 — End: 2014-05-24
  Administered 2014-05-23 – 2014-05-24 (×4): 5000 [IU] via SUBCUTANEOUS
  Filled 2014-05-23 (×4): qty 1

## 2014-05-23 MED ORDER — ALBUTEROL SULFATE (2.5 MG/3ML) 0.083% IN NEBU
2.5000 mg | INHALATION_SOLUTION | Freq: Four times a day (QID) | RESPIRATORY_TRACT | Status: DC
  Administered 2014-05-23 – 2014-05-24 (×5): 2.5 mg via RESPIRATORY_TRACT
  Filled 2014-05-23 (×5): qty 3

## 2014-05-23 MED ORDER — GABAPENTIN 300 MG PO CAPS
300.0000 mg | ORAL_CAPSULE | Freq: Four times a day (QID) | ORAL | Status: DC
  Administered 2014-05-23 (×2): 300 mg via ORAL
  Filled 2014-05-23 (×2): qty 1

## 2014-05-23 MED ORDER — MORPHINE SULFATE ER 30 MG PO TBCR
30.0000 mg | EXTENDED_RELEASE_TABLET | Freq: Three times a day (TID) | ORAL | Status: DC
  Administered 2014-05-23 – 2014-05-24 (×4): 30 mg via ORAL
  Filled 2014-05-23 (×4): qty 1

## 2014-05-23 NOTE — Progress Notes (Signed)
PROGRESS NOTE  Wesley [name] NLG:[id number] DOB: 07-25-1965 DOA: 05/22/2014 PCP: [name], MD  Summary: 49 year old man with history of COPD, chronic hypoxic respiratory failure on oxygen, bilateral partial pneumonectomy followed at [facility] presented to the emergency department with persistent dyspnea on exertion. Also noted to have nausea, vomiting and diarrhea. He was admitted for COPD exacerbation.  Admitted 3/11-3/14 for acute on chronic respiratory failure secondary to COPD exacerbation, influenza PCR was negative, nausea and vomiting thought secondary to viral gastroenteritis. There is concern for drug-seeking behavior.  Assessment/Plan: 1. COPD exacerbation, status post bilateral bullectomy (2014, June 2015). Slowly improving. 2. Chronic hypoxic respiratory failure. Stable. 3. Vomiting, diarrhea . Resolved. Possibly acute gastroenteritis or abx associated diarrhea. 4. Fall at home with right hip pain 5. Chronic pain syndrome (postthoracotomy pain, lumbar pain, chronic right hip pain) followed by [facility], Dr. Randolm Idol: currently taking MS Contin 30mg  q8h, Oxycodone 15-30 mg q4-6h PRN, Gabapentin 600mg  q8h, and APAP 650mg  q8h PRN for pain    Overall improving, respiratory status improved.  Vomiting and diarrhea have resolved  Continue steroids, nebs, oxygen  Continue chronic pain regimen  Code Status: full code DVT prophylaxis: heparin Family Communication: none Disposition Plan: home  Murray Hodgkins, MD  [facility]  Pager [phone] If 7PM-7AM, please contact night-coverage at [email or web address], password [facility] 05/23/2014, 4:09 PM  LOS: 0 days   Consultants:    Procedures:    Antibiotics:    HPI/Subjective: Feeling better, no vomiting since 9 AM. No diarrhea since yesterday. Breathing better. Able to tolerate diet.  Objective: Filed Vitals:   05/23/14 0730 05/23/14 1134 05/23/14 1514 05/23/14 1529  BP:    106/52  Pulse:    99   Temp:    97.7 F (36.5 C)  TempSrc:    Oral  Resp:    20  Height:      Weight:      SpO2: 98% 97% 95% 95%    Intake/Output Summary (Last 24 hours) at 05/23/14 1609 Last data filed at 05/23/14 1531  Gross per 24 hour  Intake 3840.42 ml  Output   2000 ml  Net 1840.42 ml     Filed Weights   05/22/14 2027  Weight: 79.833 kg (176 lb)    Exam:     Afebrile, vital signs are stable. Stable oxygenation on 2.5 L General: Appears calm and comfortable Cardiovascular: RRR, no m/r/g.  Telemetry: SR, no arrhythmias  Respiratory: bilateral wheezes, no rhonchi or rales; fair air movement. Mild increased respiratory effort. Able to speak in full sentences. Psychiatric: grossly normal mood and affect, speech fluent and appropriate Neurologic: grossly non-focal.  Data Reviewed:  Urine output 8563  Basic metabolic panel unremarkable  CBC notable for WBC 18.8 (on steroids)  Abdominal x-ray negative for acute abnormalities.  CT chest: Significant emphysema.  Scheduled Meds: . albuterol  2.5 mg Nebulization QID  . antiseptic oral rinse  7 mL Mouth Rinse BID  . gabapentin  300 mg Oral QID  . heparin  5,000 Units Subcutaneous 3 times per day  . methylPREDNISolone (SOLU-MEDROL) injection  40 mg Intravenous 4 times per day  . morphine  30 mg Oral TID  . nicotine  14 mg Transdermal Daily  . potassium chloride SA  20 mEq Oral Daily  . sodium chloride  3 mL Intravenous Q12H  . tiotropium  18 mcg Inhalation Daily   Continuous Infusions: . sodium chloride 1,000 mL (05/23/14 1409)    Principal Problem:   COPD exacerbation  Active Problems:   COPD (chronic obstructive pulmonary disease)   Chronic pain   Tobacco abuse   Opiate abuse, continuous   Hip pain   Nausea and vomiting   Time spent 20 minutes

## 2014-05-23 NOTE — H&P (Signed)
[facility] History and Physical  MOUHAMAD TEED OFH:[id number] DOB: 1965/04/28    PCP:   [name], MD   Chief Complaint: persistent SOB and coughs.  HPI: Wesley [name] is an 49 y.o. male with hx of COPD, chronic pain syndrome, hx of chronic right hip avascular necrosis, chronic back pain, hx of bilateral partial pnemonectomy, receiving care at [facility], recently admitted and discharged for COPD exacerbation on antibiotic and steroid taper, returned to the ER with persistent DOE.  He denied chest pain.  Evalatuion in the ER showed CXR with no infiltrate, and nodular opacity, recommending Chest CT, WBC of 16K, with normal Cr and Hb, along with normal renal Fx tests.  He was given IV Steroid, nebs, and hospitalist was once again asked to admit him for COPD exacerbation.  He was noted as having narcotic seeking behavior, but when I spoke about it with him, he said he was mistaken for his brother.   Rewiew of Systems:  Constitutional: Negative for malaise, fever and chills. No significant weight loss or weight gain Eyes: Negative for eye pain, redness and discharge, diplopia, visual changes, or flashes of light. ENMT: Negative for ear pain, hoarseness, nasal congestion, sinus pressure and sore throat. No headaches; tinnitus, drooling, or problem swallowing. Cardiovascular: Negative for chest pain, palpitations, diaphoresis, and peripheral edema. ; No orthopnea, PND Respiratory: Negative for cough, hemoptysis, wheezing and stridor. No pleuritic chestpain. Gastrointestinal: Negative for nausea, vomiting, diarrhea, constipation, abdominal pain, melena, blood in stool, hematemesis, jaundice and rectal bleeding.    Genitourinary: Negative for frequency, dysuria, incontinence,flank pain and hematuria; Musculoskeletal: Negative for back pain and neck pain. Negative for swelling and trauma.;  Skin: . Negative for pruritus, rash, abrasions, bruising and skin lesion.;  ulcerations Neuro: Negative for headache, lightheadedness and neck stiffness. Negative for weakness, altered level of consciousness , altered mental status, extremity weakness, burning feet, involuntary movement, seizure and syncope.  Psych: negative for anxiety, depression, insomnia, tearfulness, panic attacks, hallucinations, paranoia, suicidal or homicidal ideation    Past Medical History  Diagnosis Date  . COPD (chronic obstructive pulmonary disease)   . Osteoporosis   . Neuropathy   . Back pain   . Depression   . Avascular necrosis of femur     Past Surgical History  Procedure Laterality Date  . Joint replacement    . Back surgery    . Lung surgery      Medications:  HOME MEDS: Prior to Admission medications   Medication Sig Start Date End Date Taking? Authorizing Provider  albuterol (PROVENTIL HFA;VENTOLIN HFA) 108 (90 BASE) MCG/ACT inhaler Inhale 2 puffs into the lungs every 4 (four) hours as needed for wheezing or shortness of breath. Shortness of breath 01/06/12  Yes Delfina Redwood, MD  albuterol (PROVENTIL) (2.5 MG/3ML) 0.083% nebulizer solution Take 3 mLs (2.5 mg total) by nebulization every 6 (six) hours as needed for wheezing or shortness of breath. 02/27/14  Yes Kathie Dike, MD  budesonide-formoterol (SYMBICORT) 160-4.5 MCG/ACT inhaler Inhale 3 puffs into the lungs 4 (four) times daily.   Yes Historical Provider, MD  gabapentin (NEURONTIN) 300 MG capsule Take 300 mg by mouth 4 (four) times daily.    Yes Historical Provider, MD  morphine (MS CONTIN) 30 MG 12 hr tablet Take 30 mg by mouth 3 (three) times daily. 01/05/14  Yes Historical Provider, MD  nicotine (NICODERM CQ - DOSED IN MG/24 HOURS) 14 mg/24hr patch Place 14 mg onto the skin daily.   Yes Historical  Provider, MD  ondansetron (ZOFRAN ODT) 4 MG disintegrating tablet Take 1 tablet (4 mg total) by mouth every 8 (eight) hours as needed for nausea or vomiting. 05/18/14  Yes Estela Leonie Green, MD   oxyCODONE (ROXICODONE) 15 MG immediate release tablet Take 30 mg by mouth every 4 (four) hours as needed for pain.  01/05/14  Yes Historical Provider, MD  Polyethyl Glycol-Propyl Glycol (SYSTANE) 0.4-0.3 % SOLN Place 1-2 drops into the left eye daily as needed (for dry eye relief).    Yes Historical Provider, MD  potassium chloride SA (K-DUR,KLOR-CON) 20 MEQ tablet Take 20 mEq by mouth daily.   Yes Historical Provider, MD  predniSONE (DELTASONE) 10 MG tablet Take 1 tablet (10 mg total) by mouth daily with breakfast. Take 6 tablets today and decrease by 1 tablet daily until none are left. 05/18/14  Yes Estela Leonie Green, MD  senna (SENOKOT) 8.6 MG tablet Take 2 tablets by mouth 2 (two) times daily.   Yes Historical Provider, MD  tiotropium (SPIRIVA) 18 MCG inhalation capsule Place 1 capsule (18 mcg total) into inhaler and inhale daily. 07/24/11  Yes Nimish Luther Parody, MD  levofloxacin (LEVAQUIN) 500 MG tablet Take 1 tablet (500 mg total) by mouth daily at 6 PM. Patient not taking: Reported on 05/22/2014 05/18/14   Erline Hau, MD     Allergies:  Allergies  Allergen Reactions  . Ambien [Zolpidem Tartrate] Other (See Comments)    hallucinations  . Melatonin Itching    Social History:   reports that he has been smoking.  He does not have any smokeless tobacco history on file. He reports that he does not drink alcohol or use illicit drugs.  Family History: Family History  Problem Relation Age of Onset  . Heart failure Father   . Diabetes Father   . Diabetes Mother   . Cancer Mother     male cancer     Physical Exam: Filed Vitals:   05/22/14 2215 05/22/14 2330 05/23/14 0036 05/23/14 0100  BP:  104/45  105/53  Pulse:  87  93  Temp:    97.7 F (36.5 C)  TempSrc:    Oral  Resp:  17  20  Height:      Weight:      SpO2: 100% 98% 95% 96%   Blood pressure 105/53, pulse 93, temperature 97.7 F (36.5 C), temperature source Oral, resp. rate 20, height 6\' 1"  (1.854  m), weight 79.833 kg (176 lb), SpO2 96 %.  GEN:  Pleasant patient lying in the stretcher in no acute distress; cooperative with exam. PSYCH:  alert and oriented x4; does not appear anxious or depressed; affect is appropriate. HEENT: Mucous membranes pink and anicteric; PERRLA; EOM intact; no cervical lymphadenopathy nor thyromegaly or carotid bruit; no JVD; There were no stridor. Neck is very supple. Breasts:: Not examined CHEST WALL: No tenderness CHEST: Normal respiration, with inspiratory and expiratory wheezing.  HEART: Regular rate and rhythm.  There are no murmur, rub, or gallops.   BACK: No kyphosis or scoliosis; no CVA tenderness ABDOMEN: soft and non-tender; no masses, no organomegaly, normal abdominal bowel sounds; no pannus; no intertriginous candida. There is no rebound and no distention. Rectal Exam: Not done EXTREMITIES: No bone or joint deformity; age-appropriate arthropathy of the hands and knees; no edema; no ulcerations.  There is no calf tenderness. Genitalia: not examined PULSES: 2+ and symmetric SKIN: Normal hydration no rash or ulceration CNS: Cranial nerves 2-12 grossly intact no  focal lateralizing neurologic deficit.  Speech is fluent; uvula elevated with phonation, facial symmetry and tongue midline. DTR are normal bilaterally, cerebella exam is intact, barbinski is negative and strengths are equaled bilaterally.  No sensory loss.   Labs on Admission:  Basic Metabolic Panel:  Recent Labs Lab 05/16/14 0722 05/17/14 0625 05/18/14 0657 05/22/14 2204  NA 140 141 142 138  K 4.0 4.0 4.4 4.0  CL 109 109 107 101  CO2 25 26 30 27   GLUCOSE 174* 149* 142* 98  BUN 11 11 11 23   CREATININE 0.83 0.78 0.70 0.85  CALCIUM 8.9 8.9 9.1 9.2   Liver Function Tests:  Recent Labs Lab 05/16/14 0722 05/22/14 2204  AST 23 24  ALT 18 57*  ALKPHOS 73 84  BILITOT 0.5 0.3  PROT 6.9 6.8  ALBUMIN 3.8 3.9    Recent Labs Lab 05/22/14 2204  LIPASE 25   No results for  input(s): AMMONIA in the last 168 hours. CBC:  Recent Labs Lab 05/16/14 0722 05/17/14 0625 05/22/14 2204  WBC 7.7 13.8* 16.1*  HGB 13.6 12.2* 15.4  HCT 40.8 37.8* 45.3  MCV 92.9 95.2 93.4  PLT 291 301 329   Radiological Exams on Admission: Dg Chest 2 View  05/22/2014   CLINICAL DATA:  Shortness of breath  EXAM: CHEST  2 VIEW  COMPARISON:  05/15/2014, 02/24/2014  FINDINGS: Hyperinflation. Interstitial coarsening. Biapical suture again noted. Linear branching over the right apex may be suture versus a filter within the right brachiocephalic vein. Left mid lung peripheral nodular opacity again noted. No confluent airspace opacity, pleural effusion, or pneumothorax. Cardiomediastinal contours within normal range.  IMPRESSION: COPD.  No focal consolidation.  Nodular opacity projecting of the periphery of the left mid lung persists. Favor nodular scarring. Chest CT again recommend to exclude a nodule.   Electronically Signed   By: Carlos Levering M.D.   On: 05/22/2014 21:53   Dg Abd 2 Views  05/22/2014   CLINICAL DATA:  Weakness, vomiting and dyspnea.  EXAM: ABDOMEN - 2 VIEW  COMPARISON:  06/14/2012  FINDINGS: The bowel gas pattern is normal. There is no evidence of free air. No biliary or urinary calculi are evident. There are severe chronic changes about the right hip which likely represent AVN with subchondral fracture and collapse, old.  IMPRESSION: Negative for bowel obstruction or perforation. Normal bowel gas pattern. Severe chronic changes about the right hip.   Electronically Signed   By: Andreas Newport M.D.   On: 05/22/2014 23:51   Dg Hip Unilat With Pelvis 2-3 Views Right  05/22/2014   CLINICAL DATA:  Weakness, multiple falls.  EXAM: RIGHT HIP (WITH PELVIS) 2-3 VIEWS  COMPARISON:  06/14/2012  FINDINGS: Deformity of the right femoral head. Left hip arthroplasty. No periprosthetic lucency about the visualized portion. Pubic rami intact. No displaced acute fracture identified. Sacrum  obscured by overlying bowel.  IMPRESSION: Right femoral head avascular necrosis with collapse.  No acute osseous finding.  Recommend MRI if concern for nondisplaced fracture or other radiographically occult pathology persists.   Electronically Signed   By: Carlos Levering M.D.   On: 05/22/2014 23:53    EKG: Independently reviewed   Assessment/Plan Present on Admission:  . COPD exacerbation . COPD (chronic obstructive pulmonary disease) . Hip pain . Chronic pain . Opiate abuse, continuous . Tobacco abuse . Nausea and vomiting  PLAN:  Will admit him for COPD exacerbation.  WIll continue with his IV Steroids, and nebs, but he doesn't need  any more antibiotics.  For his nausea, vomiting and diarrhea, will send for C diff, but I don't suspect that he has it.  He is planning to have surgery for his right hip, and that will help him.  He is otherwise stable, full code, and will be admitted to [facility] service.  I will give him narcotic judiciously.  Thank you .   Other plans as per orders.  Code Status: FULL Haskel Khan, MD. [facility] Pager [phone] 7pm to 7am.  05/23/2014, 1:43 AM

## 2014-05-24 MED ORDER — ONDANSETRON 4 MG PO TBDP: 4.0000 mg | ORAL_TABLET | Freq: Three times a day (TID) | ORAL | Status: DC | PRN

## 2014-05-24 MED ORDER — PREDNISONE 10 MG PO TABS: ORAL_TABLET | ORAL | Status: DC

## 2014-05-24 NOTE — Progress Notes (Addendum)
Patient discharged home today.  Patient was given discharge instructions, prescriptions, and care notes on COPD.   Patient also educated on risk of driving or operating heavy machinery while on the pain meds he had just received.  Patient verbalized understanding with no complaints or concerns voiced at this time.  IV was removed with catheter intact, no bleeding or complications.  Patient left unit in stable condition by a staff member in a wheelchair.

## 2014-05-24 NOTE — Discharge Summary (Signed)
Physician Discharge Summary  Wesley [name] FTD:[id number] DOB: 22-Mar-1965 DOA: 05/22/2014  PCP: Wesley Burly, MD  Admit date: 05/22/2014 Discharge date: 05/24/2014  Recommendations for Outpatient Follow-up:  1. Resolution of COPD exacerbation   Follow-up Information    Follow up with Wesley A, MD. Schedule an appointment as soon as possible for [name] visit in 2 weeks.   Specialty:  Internal Medicine   Contact information:   [address] [place] [address] 628 [phone]       Follow up with Wesley Presser, MD. Schedule an appointment as soon as possible for [name] visit in 1 week.   Specialty:  Thoracic Surgery   Contact information:   [facility] [place] [address] [phone]      Discharge Diagnoses:  1. COPD exacerbation 2. Chronic hypoxic respiratory failure nares COPD 3. Status post bilateral bullectomy 2014, 2015 4. Chronic pain syndrome (postthoracotomy pain, lumbar pain, chronic right hip pain)  Discharge Condition: Improved Disposition: Home  Diet recommendation: Regular  Filed Weights   05/22/14 2027  Weight: 79.833 kg (176 lb)    History of present illness:  49 year old man with history of COPD, chronic hypoxic respiratory failure on oxygen, bilateral partial pneumonectomy followed at [facility] presented to the emergency department with persistent dyspnea on exertion. Also noted to have nausea, vomiting and diarrhea. He was admitted for COPD exacerbation.  Hospital Course:  Wesley [name] apically improved with steroids, bronchodilators and oxygen supplementation. He had no further significant vomiting or diarrhea. Stool specimen could not be obtained. Hospitalization was uncomplicated. Now stable for discharge. No narcotics were prescribed.   COPD exacerbation, status post bilateral bullectomy (2014, June 2015). Much improved.  Chronic hypoxic respiratory failure secondary to COPD. Remains stable.  Vomiting,  diarrhea . Resolved. Possibly acute gastroenteritis or abx associated diarrhea. Unable to provide specimen for cdiff test.  Chronic pain syndrome (postthoracotomy pain, lumbar pain, chronic right hip pain) followed by [facility], Wesley [name].  Consultants:  None (pulmonology not available)  Procedures:  none  Antibiotics:  none  Discharge Instructions  Discharge Instructions    Activity as tolerated - No restrictions    Complete by:  As directed      Diet general    Complete by:  As directed      Discharge instructions    Complete by:  As directed   Call your physician or seek immediate medical attention for increased shortness of breath, wheezing, fever or worsening of condition.          Current Discharge Medication List    CONTINUE these medications which have CHANGED   Details  ondansetron (ZOFRAN ODT) 4 MG disintegrating tablet Take 1 tablet (4 mg total) by mouth every 8 (eight) hours as needed for nausea or vomiting. Qty: 20 tablet, Refills: 0    predniSONE (DELTASONE) 10 MG tablet Take 40 mg by mouth daily for 4 days, then take 20 mg by mouth daily for 4 days, then take 10 mg by mouth daily for 4 days, then stop. Qty: 28 tablet, Refills: 0      CONTINUE these medications which have NOT CHANGED   Details  albuterol (PROVENTIL HFA;VENTOLIN HFA) 108 (90 BASE) MCG/ACT inhaler Inhale 2 puffs into the lungs every 4 (four) hours as needed for wheezing or shortness of breath. Shortness of breath Qty: 1 Inhaler, Refills: 0    albuterol (PROVENTIL) (2.5 MG/3ML) 0.083% nebulizer solution Take 3 mLs (2.5 mg total)  by nebulization every 6 (six) hours as needed for wheezing or shortness of breath. Qty: 75 mL, Refills: 12    budesonide-formoterol (SYMBICORT) 160-4.5 MCG/ACT inhaler Inhale 3 puffs into the lungs 4 (four) times daily.    gabapentin (NEURONTIN) 300 MG capsule Take 300 mg by mouth 4 (four) times daily.     morphine (MS CONTIN) 30 MG 12 hr tablet Take  30 mg by mouth 3 (three) times daily. Refills: 0    nicotine (NICODERM CQ - DOSED IN MG/24 HOURS) 14 mg/24hr patch Place 14 mg onto the skin daily.    oxyCODONE (ROXICODONE) 15 MG immediate release tablet Take 30 mg by mouth every 4 (four) hours as needed for pain.  Refills: 0    Polyethyl Glycol-Propyl Glycol (SYSTANE) 0.4-0.3 % SOLN Place 1-2 drops into the left eye daily as needed (for dry eye relief).     potassium chloride SA (K-DUR,KLOR-CON) 20 MEQ tablet Take 20 mEq by mouth daily.    senna (SENOKOT) 8.6 MG tablet Take 2 tablets by mouth 2 (two) times daily.    tiotropium (SPIRIVA) 18 MCG inhalation capsule Place 1 capsule (18 mcg total) into inhaler and inhale daily. Qty: 30 capsule, Refills: 0      STOP taking these medications     levofloxacin (LEVAQUIN) 500 MG tablet        Allergies  Allergen Reactions  . Ambien [Zolpidem Tartrate] Other (See Comments)    hallucinations  . Melatonin Itching    The results of significant diagnostics from this hospitalization (including imaging, microbiology, ancillary and laboratory) are listed below for reference.    Significant Diagnostic Studies: Dg Chest 2 View  05/22/2014   CLINICAL DATA:  Shortness of breath  EXAM: CHEST  2 VIEW  COMPARISON:  05/15/2014, 02/24/2014  FINDINGS: Hyperinflation. Interstitial coarsening. Biapical suture again noted. Linear branching over the right apex may be suture versus [name] filter within the right brachiocephalic vein. Left mid lung peripheral nodular opacity again noted. No confluent airspace opacity, pleural effusion, or pneumothorax. Cardiomediastinal contours within normal range.  IMPRESSION: COPD.  No focal consolidation.  Nodular opacity projecting of the periphery of the left mid lung persists. Favor nodular scarring. Chest CT again recommend to exclude [name] nodule.   Electronically Signed   By: Wesley [name] M.D.   On: 05/22/2014 21:53   Ct Chest Wo Contrast  05/23/2014   CLINICAL DATA:   Subsequent evaluation possible left pulmonary nodule, history of asbestos exposure and COPD  EXAM: CT CHEST WITHOUT CONTRAST  TECHNIQUE: Multidetector CT imaging of the chest was performed following the standard protocol without IV contrast.  COMPARISON:  05/22/2014, 07/18/2011  FINDINGS: Moderately severe emphysematous change in both upper lung zones. No evidence of pulmonary mass or infiltrate. Mild peripheral atelectasis or scarring identified along the sub pleural periphery of the inferolateral lingula. This appears to account for the opacity seen on recent chest radiograph.  No pleural or pericardial effusion. Thoracic inlet normal. No significant hilar or mediastinal adenopathy.  Images through the upper abdomen show an approximately 13 mm low-attenuation left adrenal nodule. This appears stable from 07/18/2011 and is likely benign, such as an adenoma.  No acute musculoskeletal findings.  IMPRESSION: Significant emphysematous change. The opacity in the lateral left lung on recent chest radiograph appears most consistent with peripheral subpleural atelectasis and scarring.   Electronically Signed   By: Skipper Cliche M.D.   On: 05/23/2014 10:12   Dg Abd 2 Views  05/22/2014   CLINICAL DATA:  Weakness, vomiting and dyspnea.  EXAM: ABDOMEN - 2 VIEW  COMPARISON:  06/14/2012  FINDINGS: The bowel gas pattern is normal. There is no evidence of free air. No biliary or urinary calculi are evident. There are severe chronic changes about the right hip which likely represent AVN with subchondral fracture and collapse, old.  IMPRESSION: Negative for bowel obstruction or perforation. Normal bowel gas pattern. Severe chronic changes about the right hip.   Electronically Signed   By: Andreas Newport M.D.   On: 05/22/2014 23:51   Dg Hip Unilat With Pelvis 2-3 Views Right  05/22/2014   CLINICAL DATA:  Weakness, multiple falls.  EXAM: RIGHT HIP (WITH PELVIS) 2-3 VIEWS  COMPARISON:  06/14/2012  FINDINGS: Deformity of the  right femoral head. Left hip arthroplasty. No periprosthetic lucency about the visualized portion. Pubic rami intact. No displaced acute fracture identified. Sacrum obscured by overlying bowel.  IMPRESSION: Right femoral head avascular necrosis with collapse.  No acute osseous finding.  Recommend MRI if concern for nondisplaced fracture or other radiographically occult pathology persists.   Electronically Signed   By: Wesley [name] M.D.   On: 05/22/2014 23:53    Labs: Basic Metabolic Panel:  Recent Labs Lab 05/18/14 0657 05/22/14 2204 05/23/14 0643  NA 142 138 138  K 4.4 4.0 4.5  CL 107 101 105  CO2 30 27 26   GLUCOSE 142* 98 152*  BUN 11 23 18   CREATININE 0.70 0.85 0.83  CALCIUM 9.1 9.2 8.3*   Liver Function Tests:  Recent Labs Lab 05/22/14 2204  AST 24  ALT 57*  ALKPHOS 84  BILITOT 0.3  PROT 6.8  ALBUMIN 3.9    Recent Labs Lab 05/22/14 2204  LIPASE 25   CBC:  Recent Labs Lab 05/22/14 2204 05/23/14 0643  WBC 16.1* 18.8*  HGB 15.4 13.3  HCT 45.3 40.6  MCV 93.4 94.6  PLT 329 281    Principal Problem:   COPD exacerbation Active Problems:   COPD (chronic obstructive pulmonary disease)   Chronic pain   Tobacco abuse   Opiate abuse, continuous   Hip pain   Nausea and vomiting   Vomiting and diarrhea   Time coordinating discharge: 35 minutes  Signed:  Murray Hodgkins, MD [facility] 05/24/2014, 9:30 AM

## 2014-05-24 NOTE — Progress Notes (Signed)
PROGRESS NOTE  Wesley [name] ZHG:[id number] DOB: 1965-07-29 DOA: 05/22/2014 PCP: [name], MD  Summary: 49 year old man with history of COPD, chronic hypoxic respiratory failure on oxygen, bilateral partial pneumonectomy followed at [facility] presented to the emergency department with persistent dyspnea on exertion. Also noted to have nausea, vomiting and diarrhea. He was admitted for COPD exacerbation.  Admitted 3/11-3/14 for acute on chronic respiratory failure secondary to COPD exacerbation, influenza PCR was negative, nausea and vomiting thought secondary to viral gastroenteritis. There is concern for drug-seeking behavior.  Assessment/Plan: 1. COPD exacerbation, status post bilateral bullectomy (2014, June 2015). Much improved. 2. Chronic hypoxic respiratory failure secondary to COPD. Remains stable. 3. Vomiting, diarrhea . Resolved. Possibly acute gastroenteritis or abx associated diarrhea. Unable to provide specimen for cdiff test. 4. Fall at home with right hip pain 5. Chronic pain syndrome (postthoracotomy pain, lumbar pain, chronic right hip pain) followed by [facility], Dr. Randolm Idol: currently taking MS Contin 30mg  q8h, Oxycodone 15-30 mg q4-6h PRN, Gabapentin 600mg  q8h, and APAP 650mg  q8h PRN for pain    Much improved.   Home today on slow steroid taper.  Murray Hodgkins, MD  [facility]  Pager [phone] If 7PM-7AM, please contact night-coverage at [email or web address], password [facility] 05/24/2014, 9:16 AM  LOS: 1 day   Consultants:    Procedures:    Antibiotics:    HPI/Subjective: RN reports patient feeling better and anxious to go home.  Overall feeling better, eating ok, no vomiting. No diarrhea. Breathing better. Wants to go home.  Objective: Filed Vitals:   05/23/14 1944 05/23/14 2200 05/24/14 0600 05/24/14 0701  BP:  110/62 108/64   Pulse:  89 88   Temp:  98 F (36.7 C) 98.6 F (37 C)   TempSrc:  Oral Oral   Resp:  18 20     Height:      Weight:      SpO2: 96% 93% 92% 97%    Intake/Output Summary (Last 24 hours) at 05/24/14 0916 Last data filed at 05/23/14 2222  Gross per 24 hour  Intake 960.42 ml  Output   2725 ml  Net -1764.58 ml     Filed Weights   05/22/14 2027  Weight: 79.833 kg (176 lb)    Exam:     Afebrile, VSS. Stable oxygenation on 3 L General:  Appears comfortable, calm. Cardiovascular: Regular rate and rhythm, no murmur, rub or gallop. No lower extremity edema. Respiratory: Clear to auscultation bilaterally, no wheezes, rales or rhonchi. Decreased breath sounds. Speaks in full sentences. Normal respiratory effort. Musculoskeletal: grossly normal tone bilateral upper and lower extremities, able to lift both legs. Psychiatric: grossly normal mood and affect, speech fluent and appropriate  Data Reviewed:  Urine output 2725  Abdominal x-ray negative for acute abnormalities.  CT chest: Significant emphysema.  Scheduled Meds: . albuterol  2.5 mg Nebulization QID  . antiseptic oral rinse  7 mL Mouth Rinse BID  . gabapentin  600 mg Oral TID  . heparin  5,000 Units Subcutaneous 3 times per day  . methylPREDNISolone (SOLU-MEDROL) injection  40 mg Intravenous Q12H  . morphine  30 mg Oral TID  . nicotine  14 mg Transdermal Daily  . potassium chloride SA  20 mEq Oral Daily  . sodium chloride  3 mL Intravenous Q12H  . tiotropium  18 mcg Inhalation Daily   Continuous Infusions:    Principal Problem:   COPD exacerbation Active Problems:   COPD (chronic obstructive pulmonary disease)   Chronic  pain   Tobacco abuse   Opiate abuse, continuous   Hip pain   Nausea and vomiting   Vomiting and diarrhea

## 2014-06-05 NOTE — Progress Notes (Signed)
UR chart review completed.  

## 2015-04-06 ENCOUNTER — Ambulatory Visit: Payer: Self-pay | Admitting: Orthopedic Surgery

## 2015-04-06 NOTE — Progress Notes (Signed)
Preoperative surgical orders have been place into the Epic hospital system for Wesley [name] on 04/06/2015, 9:20 PM  by Mickel Crow for surgery on 04/21/2015.  Preop Total Hip - Anterior Approach orders including IV Tylenol, and IV Decadron as long as there are no contraindications to the above medications. Arlee Muslim, PA-C

## 2015-04-14 ENCOUNTER — Inpatient Hospital Stay (HOSPITAL_COMMUNITY)
Admission: EM | Admit: 2015-04-14 | Discharge: 2015-04-19 | DRG: 190 | Disposition: A | Discharge status: Home / Self Care | Payer: Medicare Other | Attending: Internal Medicine | Admitting: Internal Medicine

## 2015-04-14 ENCOUNTER — Emergency Department (HOSPITAL_COMMUNITY): Payer: Medicare Other

## 2015-04-14 ENCOUNTER — Encounter (HOSPITAL_COMMUNITY): Payer: Self-pay | Admitting: Emergency Medicine

## 2015-04-14 DIAGNOSIS — J438 Other emphysema: Secondary | ICD-10-CM | POA: Diagnosis not present

## 2015-04-14 DIAGNOSIS — N179 Acute kidney failure, unspecified: Secondary | ICD-10-CM | POA: Diagnosis present

## 2015-04-14 DIAGNOSIS — J189 Pneumonia, unspecified organism: Secondary | ICD-10-CM | POA: Diagnosis present

## 2015-04-14 DIAGNOSIS — Z833 Family history of diabetes mellitus: Secondary | ICD-10-CM

## 2015-04-14 DIAGNOSIS — J9621 Acute and chronic respiratory failure with hypoxia: Secondary | ICD-10-CM | POA: Diagnosis present

## 2015-04-14 DIAGNOSIS — R222 Localized swelling, mass and lump, trunk: Secondary | ICD-10-CM

## 2015-04-14 DIAGNOSIS — G894 Chronic pain syndrome: Secondary | ICD-10-CM | POA: Diagnosis present

## 2015-04-14 DIAGNOSIS — Z7951 Long term (current) use of inhaled steroids: Secondary | ICD-10-CM

## 2015-04-14 DIAGNOSIS — Z809 Family history of malignant neoplasm, unspecified: Secondary | ICD-10-CM

## 2015-04-14 DIAGNOSIS — M810 Age-related osteoporosis without current pathological fracture: Secondary | ICD-10-CM | POA: Diagnosis present

## 2015-04-14 DIAGNOSIS — J440 Chronic obstructive pulmonary disease with acute lower respiratory infection: Principal | ICD-10-CM | POA: Diagnosis present

## 2015-04-14 DIAGNOSIS — I959 Hypotension, unspecified: Secondary | ICD-10-CM | POA: Diagnosis present

## 2015-04-14 DIAGNOSIS — J441 Chronic obstructive pulmonary disease with (acute) exacerbation: Secondary | ICD-10-CM | POA: Diagnosis present

## 2015-04-14 DIAGNOSIS — M549 Dorsalgia, unspecified: Secondary | ICD-10-CM | POA: Diagnosis present

## 2015-04-14 DIAGNOSIS — Z87891 Personal history of nicotine dependence: Secondary | ICD-10-CM

## 2015-04-14 DIAGNOSIS — R0602 Shortness of breath: Secondary | ICD-10-CM | POA: Diagnosis present

## 2015-04-14 DIAGNOSIS — Z8249 Family history of ischemic heart disease and other diseases of the circulatory system: Secondary | ICD-10-CM

## 2015-04-14 DIAGNOSIS — J42 Unspecified chronic bronchitis: Secondary | ICD-10-CM

## 2015-04-14 DIAGNOSIS — R197 Diarrhea, unspecified: Secondary | ICD-10-CM

## 2015-04-14 DIAGNOSIS — J449 Chronic obstructive pulmonary disease, unspecified: Secondary | ICD-10-CM | POA: Diagnosis present

## 2015-04-14 DIAGNOSIS — R1110 Vomiting, unspecified: Secondary | ICD-10-CM

## 2015-04-14 DIAGNOSIS — G629 Polyneuropathy, unspecified: Secondary | ICD-10-CM | POA: Diagnosis present

## 2015-04-14 DIAGNOSIS — Z79891 Long term (current) use of opiate analgesic: Secondary | ICD-10-CM

## 2015-04-14 DIAGNOSIS — J432 Centrilobular emphysema: Secondary | ICD-10-CM | POA: Diagnosis not present

## 2015-04-14 LAB — CBC WITH DIFFERENTIAL/PLATELET
BASOS PCT: 0 %
Basophils Absolute: 0 10*3/uL (ref 0.0–0.1)
EOS PCT: 0 %
Eosinophils Absolute: 0 10*3/uL (ref 0.0–0.7)
HCT: 42 % (ref 39.0–52.0)
Hemoglobin: 14.8 g/dL (ref 13.0–17.0)
LYMPHS ABS: 0.8 10*3/uL (ref 0.7–4.0)
Lymphocytes Relative: 6 %
MCH: 32.4 pg (ref 26.0–34.0)
MCHC: 35.2 g/dL (ref 30.0–36.0)
MCV: 91.9 fL (ref 78.0–100.0)
Monocytes Absolute: 0.9 10*3/uL (ref 0.1–1.0)
Monocytes Relative: 8 %
Neutro Abs: 10.9 10*3/uL — ABNORMAL HIGH (ref 1.7–7.7)
Neutrophils Relative %: 86 %
Platelets: 149 10*3/uL — ABNORMAL LOW (ref 150–400)
RBC: 4.57 MIL/uL (ref 4.22–5.81)
RDW: 12.4 % (ref 11.5–15.5)
WBC: 12.6 10*3/uL — ABNORMAL HIGH (ref 4.0–10.5)

## 2015-04-14 LAB — COMPREHENSIVE METABOLIC PANEL
ALT: 24 U/L (ref 17–63)
AST: 31 U/L (ref 15–41)
Albumin: 3.6 g/dL (ref 3.5–5.0)
Alkaline Phosphatase: 78 U/L (ref 38–126)
Anion gap: 12 (ref 5–15)
BILIRUBIN TOTAL: 0.6 mg/dL (ref 0.3–1.2)
BUN: 25 mg/dL — AB (ref 6–20)
CHLORIDE: 91 mmol/L — AB (ref 101–111)
CO2: 28 mmol/L (ref 22–32)
Calcium: 8.4 mg/dL — ABNORMAL LOW (ref 8.9–10.3)
Creatinine, Ser: 1.4 mg/dL — ABNORMAL HIGH (ref 0.61–1.24)
GFR calc Af Amer: >60 mL/min (ref >60)
GFR calc non Af Amer: 56 mL/min — ABNORMAL LOW (ref >60)
Glucose, Bld: 134 mg/dL — ABNORMAL HIGH (ref 65–99)
Potassium: 3.5 mmol/L (ref 3.5–5.1)
Sodium: 131 mmol/L — ABNORMAL LOW (ref 135–145)
Total Protein: 7.5 g/dL (ref 6.5–8.1)

## 2015-04-14 MED ORDER — OXYCODONE HCL 5 MG PO TABS
30.0000 mg | ORAL_TABLET | ORAL | Status: DC | PRN
  Administered 2015-04-15 – 2015-04-19 (×27): 30 mg via ORAL
  Filled 2015-04-14 (×27): qty 6

## 2015-04-14 MED ORDER — DEXTROSE 5 % IV SOLN
1.0000 g | Freq: Once | INTRAVENOUS | Status: AC
  Administered 2015-04-14: 1 g via INTRAVENOUS
  Filled 2015-04-14: qty 10

## 2015-04-14 MED ORDER — ALBUTEROL SULFATE (2.5 MG/3ML) 0.083% IN NEBU
5.0000 mg | INHALATION_SOLUTION | Freq: Once | RESPIRATORY_TRACT | Status: AC
Start: 2015-04-14 — End: 2015-04-14
  Administered 2015-04-14: 5 mg via RESPIRATORY_TRACT
  Filled 2015-04-14: qty 6

## 2015-04-14 MED ORDER — IPRATROPIUM BROMIDE 0.02 % IN SOLN
0.5000 mg | Freq: Once | RESPIRATORY_TRACT | Status: AC
  Administered 2015-04-14: 0.5 mg via RESPIRATORY_TRACT
  Filled 2015-04-14: qty 2.5

## 2015-04-14 MED ORDER — ALBUTEROL SULFATE (2.5 MG/3ML) 0.083% IN NEBU
2.5000 mg | INHALATION_SOLUTION | Freq: Four times a day (QID) | RESPIRATORY_TRACT | Status: DC | PRN
  Administered 2015-04-15 – 2015-04-16 (×4): 2.5 mg via RESPIRATORY_TRACT
  Filled 2015-04-14 (×4): qty 3

## 2015-04-14 MED ORDER — SODIUM CHLORIDE 0.9 % IV SOLN
INTRAVENOUS | Status: DC
  Administered 2015-04-15 – 2015-04-19 (×7): via INTRAVENOUS

## 2015-04-14 MED ORDER — MORPHINE SULFATE ER 30 MG PO TBCR
30.0000 mg | EXTENDED_RELEASE_TABLET | Freq: Three times a day (TID) | ORAL | Status: DC
  Administered 2015-04-15 – 2015-04-19 (×14): 30 mg via ORAL
  Filled 2015-04-14 (×3): qty 1
  Filled 2015-04-14 (×3): qty 2
  Filled 2015-04-14 (×2): qty 1
  Filled 2015-04-14: qty 2
  Filled 2015-04-14: qty 1
  Filled 2015-04-14: qty 2
  Filled 2015-04-14 (×2): qty 1
  Filled 2015-04-14: qty 2

## 2015-04-14 MED ORDER — ENOXAPARIN SODIUM 40 MG/0.4ML ~~LOC~~ SOLN
40.0000 mg | SUBCUTANEOUS | Status: DC
  Administered 2015-04-15 – 2015-04-19 (×5): 40 mg via SUBCUTANEOUS
  Filled 2015-04-14 (×5): qty 0.4

## 2015-04-14 MED ORDER — POLYVINYL ALCOHOL 1.4 % OP SOLN
1.0000 [drp] | Freq: Every day | OPHTHALMIC | Status: DC | PRN
  Filled 2015-04-14: qty 15

## 2015-04-14 MED ORDER — SODIUM CHLORIDE 0.9 % IV SOLN
INTRAVENOUS | Status: DC
  Administered 2015-04-15 – 2015-04-17 (×3): via INTRAVENOUS

## 2015-04-14 MED ORDER — PROMETHAZINE HCL 25 MG/ML IJ SOLN
6.2500 mg | Freq: Once | INTRAMUSCULAR | Status: AC
  Administered 2015-04-14: 6.25 mg via INTRAVENOUS
  Filled 2015-04-14: qty 1

## 2015-04-14 MED ORDER — FENTANYL CITRATE (PF) 100 MCG/2ML IJ SOLN
50.0000 ug | Freq: Once | INTRAMUSCULAR | Status: AC
  Administered 2015-04-14: 50 ug via INTRAVENOUS
  Filled 2015-04-14: qty 2

## 2015-04-14 MED ORDER — GABAPENTIN 300 MG PO CAPS
300.0000 mg | ORAL_CAPSULE | Freq: Four times a day (QID) | ORAL | Status: DC
  Administered 2015-04-15 – 2015-04-19 (×19): 300 mg via ORAL
  Filled 2015-04-14 (×19): qty 1

## 2015-04-14 MED ORDER — AZITHROMYCIN 500 MG IV SOLR
500.0000 mg | Freq: Once | INTRAVENOUS | Status: AC
  Administered 2015-04-14: 500 mg via INTRAVENOUS
  Filled 2015-04-14: qty 500

## 2015-04-14 MED ORDER — DEXTROSE 5 % IV SOLN
1.0000 g | INTRAVENOUS | Status: DC
  Administered 2015-04-15 – 2015-04-18 (×4): 1 g via INTRAVENOUS
  Filled 2015-04-14 (×5): qty 10

## 2015-04-14 MED ORDER — DEXTROSE 5 % IV SOLN
500.0000 mg | INTRAVENOUS | Status: DC
  Administered 2015-04-15 – 2015-04-16 (×2): 500 mg via INTRAVENOUS
  Filled 2015-04-14 (×4): qty 500

## 2015-04-14 MED ORDER — SODIUM CHLORIDE 0.9 % IV BOLUS (SEPSIS)
500.0000 mL | Freq: Once | INTRAVENOUS | Status: AC
  Administered 2015-04-14: 500 mL via INTRAVENOUS

## 2015-04-14 MED ORDER — NICOTINE 14 MG/24HR TD PT24
14.0000 mg | MEDICATED_PATCH | Freq: Every day | TRANSDERMAL | Status: DC
  Administered 2015-04-15 – 2015-04-19 (×5): 14 mg via TRANSDERMAL
  Filled 2015-04-14 (×5): qty 1

## 2015-04-14 MED ORDER — SENNA 8.6 MG PO TABS
2.0000 | ORAL_TABLET | Freq: Two times a day (BID) | ORAL | Status: DC
  Administered 2015-04-15 – 2015-04-19 (×10): 17.2 mg via ORAL
  Filled 2015-04-14 (×10): qty 2

## 2015-04-14 NOTE — ED Notes (Signed)
Pt c/o cough, sob, chest pain and emesis x 3 days.

## 2015-04-14 NOTE — ED Provider Notes (Addendum)
CSN: [id number]     Arrival date & time 04/14/15  1858 History   First MD Initiated Contact with Patient 04/14/15 1930     Chief Complaint  Patient presents with  . Shortness of Breath    Patient is a 50 y.o. male presenting with shortness of breath. The history is provided by the patient.  Shortness of Breath Associated symptoms: abdominal pain, chest pain, cough and fever    patient presents with shortness of breath. He's had nausea vomiting and diarrhea for last few days but has had worsening shortness of breath the last couple days. States he  Has been coughing and bringing up a fair amount of sputum. States he's had fevers. He has been fatigued.  He's complaining of chest and abdominal pain. No swelling in his legs. States he has a bump on left side of his chest that comes out when he coughs. States he thinks it is his lungs.  Past Medical History  Diagnosis Date  . COPD (chronic obstructive pulmonary disease) ([place])   . Osteoporosis   . Neuropathy ([place])   . Back pain   . Depression   . Avascular necrosis of femur [facility])    Past Surgical History  Procedure Laterality Date  . Joint replacement    . Back surgery    . Lung surgery     Family History  Problem Relation Age of Onset  . Heart failure Father   . Diabetes Father   . Diabetes Mother   . Cancer Mother     male cancer   Social History  Substance Use Topics  . Smoking status: Former Smoker -- 1.00 packs/day    Quit date: 07/05/2011  . Smokeless tobacco: None  . Alcohol Use: No    Review of Systems  Constitutional: Positive for fever, appetite change and fatigue.  HENT: Negative for trouble swallowing.   Respiratory: Positive for cough and shortness of breath.   Cardiovascular: Positive for chest pain.  Gastrointestinal: Positive for abdominal pain.  Genitourinary: Negative for flank pain.  Musculoskeletal: Positive for myalgias.  Skin: Negative for wound.  Neurological: Negative for numbness.       Allergies  Ambien; Melatonin; and Zolpidem  Home Medications   Prior to Admission medications   Medication Sig Start Date End Date Taking? Authorizing Provider  albuterol (PROVENTIL HFA;VENTOLIN HFA) 108 (90 BASE) MCG/ACT inhaler Inhale 2 puffs into the lungs every 4 (four) hours as needed for wheezing or shortness of breath. Shortness of breath 01/06/12  Yes Delfina Redwood, MD  albuterol (PROVENTIL) (2.5 MG/3ML) 0.083% nebulizer solution Take 3 mLs (2.5 mg total) by nebulization every 6 (six) hours as needed for wheezing or shortness of breath. 02/27/14  Yes Kathie Dike, MD  budesonide-formoterol (SYMBICORT) 160-4.5 MCG/ACT inhaler Inhale 3 puffs into the lungs 4 (four) times daily.   Yes Historical Provider, MD  morphine (MS CONTIN) 30 MG 12 hr tablet Take 30 mg by mouth 3 (three) times daily. 01/05/14  Yes Historical Provider, MD  nicotine (NICODERM CQ - DOSED IN MG/24 HOURS) 14 mg/24hr patch Place 14 mg onto the skin daily.   Yes Historical Provider, MD  oxycodone (ROXICODONE) 30 MG immediate release tablet Take 30 mg by mouth every 4 (four) hours as needed for pain.   Yes Historical Provider, MD  tiotropium (SPIRIVA) 18 MCG inhalation capsule Place 1 capsule (18 mcg total) into inhaler and inhale daily. 07/24/11  Yes Nimish Luther Parody, MD  gabapentin (NEURONTIN) 300 MG capsule Take 300  mg by mouth 4 (four) times daily.     Historical Provider, MD  Naloxone HCl 0.4 MG/0.4ML SOAJ Inject 0.4 mLs as directed as directed.    Historical Provider, MD  ondansetron (ZOFRAN ODT) 4 MG disintegrating tablet Take 1 tablet (4 mg total) by mouth every 8 (eight) hours as needed for nausea or vomiting. Patient not taking: Reported on 04/14/2015 05/24/14   Samuella Cota, MD  Polyethyl Glycol-Propyl Glycol (SYSTANE) 0.4-0.3 % SOLN Place 1-2 drops into the left eye daily as needed (for dry eye relief).     Historical Provider, MD  potassium chloride SA (K-DUR,KLOR-CON) 20 MEQ tablet Take 20 mEq by  mouth daily.    Historical Provider, MD  predniSONE (DELTASONE) 10 MG tablet Take 40 mg by mouth daily for 4 days, then take 20 mg by mouth daily for 4 days, then take 10 mg by mouth daily for 4 days, then stop. Patient not taking: Reported on 04/14/2015 05/25/14   Samuella Cota, MD  senna (SENOKOT) 8.6 MG tablet Take 2 tablets by mouth 2 (two) times daily.    Historical Provider, MD   BP 112/62 mmHg  Pulse 108  Temp(Src) 98.3 F (36.8 C) (Oral)  Resp 18  Ht 6\' 1"  (1.854 m)  Wt 172 lb (78.019 kg)  BMI 22.70 kg/m2  SpO2 95% Physical Exam  Constitutional: He appears well-developed.  Cardiovascular:   tachycardia  Pulmonary/Chest: He exhibits tenderness.   Diffuse wheezes and prolonged expirations. On the left lateral chest wall in the subaxillary area when he coughs there is a soft tissue mass that will present between 2 ribs. After the coughing and will go back between the ribs.  Abdominal: There is tenderness.   Mild tenderness to abdominal wall diffusely and lower chest wall.  Musculoskeletal: He exhibits no edema.  Neurological: He is alert.  Skin: Skin is warm.    ED Course  Procedures (including critical care time) Labs Review Labs Reviewed  COMPREHENSIVE METABOLIC PANEL - Abnormal; Notable for the following:    Sodium 131 (*)    Chloride 91 (*)    Glucose, Bld 134 (*)    BUN 25 (*)    Creatinine, Ser 1.40 (*)    Calcium 8.4 (*)    GFR calc non Af Amer 56 (*)    All other components within normal limits  CBC WITH DIFFERENTIAL/PLATELET - Abnormal; Notable for the following:    WBC 12.6 (*)    Platelets 149 (*)    Neutro Abs 10.9 (*)    All other components within normal limits    Imaging Review Dg Chest Portable 1 View  04/14/2015  CLINICAL DATA:  Shortness of breath, productive cough EXAM: PORTABLE CHEST 1 VIEW COMPARISON:  CT chest 05/23/2014 FINDINGS: There is right lower lobe airspace disease. There is hazy left lower lobe airspace disease. There is no pleural  effusion or pneumothorax. The heart and mediastinal contours are unremarkable. The osseous structures are unremarkable. IMPRESSION: Bilateral lower lobe pneumonia, right greater than left. Followup PA and lateral chest X-ray is recommended in 3-4 weeks following trial of antibiotic therapy to ensure resolution and exclude underlying malignancy. Electronically Signed   By: Kathreen Devoid   On: 04/14/2015 20:03   I have personally reviewed and evaluated these images and lab results as part of my medical decision-making.   EKG Interpretation   Date/Time:  Wednesday April 14 2015 20:10:52 EST Ventricular Rate:  111 PR Interval:  141 QRS Duration: 88 QT Interval:  338 QTC Calculation: 459 R Axis:   -99 Text Interpretation:  Sinus tachycardia LAD, consider left anterior  fascicular block Confirmed by Carnie Bruemmer  MD, Ovid Curd [phone]) on 04/14/2015  10:01:38 PM      MDM   Final diagnoses:  CAP (community acquired pneumonia)  Vomiting and diarrhea  Acute on chronic respiratory failure with hypoxia ([place])    * patient with nausea vomiting diarrhea and pneumonia. Has bilateral pneumonia on x-ray. Some hypoxia. Will admit to internal medicine.    Davonna Belling, MD 04/14/15 [id number]  Davonna Belling, MD 04/14/15 2201

## 2015-04-14 NOTE — ED Notes (Signed)
hospitalist at the bedside 

## 2015-04-14 NOTE — Progress Notes (Signed)
Left message with velvet mcbryde at dr Tresa Moore office that i cannot get in touch with pt to set up preop

## 2015-04-14 NOTE — ED Notes (Signed)
Chest pain worse with cough, coughing up yellow sputum

## 2015-04-14 NOTE — H&P (Addendum)
PCP:   [name], MD   Chief Complaint:  Shortness of breath  HPI: 50 year old male who   has a past medical history of COPD (chronic obstructive pulmonary disease) ([place]); Osteoporosis; Neuropathy ([place]); Back pain; Depression; and Avascular necrosis of femur ([place]). today presents to the hospital with shortness of breath. Also had nausea vomiting and diarrhea for past few days but complains of coughing and bringing up yellow-colored phlegm. Patient also had fever. Denies any chills. Patient also has noticed a lump on the left side of the chest which protrudes when he coughs. Patient had bilateral lung surgery at [facility]. He takes MS Contin and oxycodone for chronic back pain. In the ED chest x-ray showed bilateral pneumonia right more than left. Patient started on ceftriaxone and Zithromax.  Allergies:   Allergies  Allergen Reactions  . Ambien [Zolpidem Tartrate] Other (See Comments)    hallucinations  . Melatonin Itching  . Zolpidem Other (See Comments)    Altered mental status      Past Medical History  Diagnosis Date  . COPD (chronic obstructive pulmonary disease) ([place])   . Osteoporosis   . Neuropathy ([place])   . Back pain   . Depression   . Avascular necrosis of femur [facility])     Past Surgical History  Procedure Laterality Date  . Joint replacement    . Back surgery    . Lung surgery      Prior to Admission medications   Medication Sig Start Date End Date Taking? Authorizing Provider  albuterol (PROVENTIL HFA;VENTOLIN HFA) 108 (90 BASE) MCG/ACT inhaler Inhale 2 puffs into the lungs every 4 (four) hours as needed for wheezing or shortness of breath. Shortness of breath 01/06/12  Yes Delfina Redwood, MD  albuterol (PROVENTIL) (2.5 MG/3ML) 0.083% nebulizer solution Take 3 mLs (2.5 mg total) by nebulization every 6 (six) hours as needed for wheezing or shortness of breath. 02/27/14  Yes Kathie Dike, MD  budesonide-formoterol (SYMBICORT) 160-4.5 MCG/ACT inhaler Inhale  3 puffs into the lungs 4 (four) times daily.   Yes Historical Provider, MD  morphine (MS CONTIN) 30 MG 12 hr tablet Take 30 mg by mouth 3 (three) times daily. 01/05/14  Yes Historical Provider, MD  nicotine (NICODERM CQ - DOSED IN MG/24 HOURS) 14 mg/24hr patch Place 14 mg onto the skin daily.   Yes Historical Provider, MD  oxycodone (ROXICODONE) 30 MG immediate release tablet Take 30 mg by mouth every 4 (four) hours as needed for pain.   Yes Historical Provider, MD  tiotropium (SPIRIVA) 18 MCG inhalation capsule Place 1 capsule (18 mcg total) into inhaler and inhale daily. 07/24/11  Yes Nimish Luther Parody, MD  gabapentin (NEURONTIN) 300 MG capsule Take 300 mg by mouth 4 (four) times daily.     Historical Provider, MD  Naloxone HCl 0.4 MG/0.4ML SOAJ Inject 0.4 mLs as directed as directed.    Historical Provider, MD  ondansetron (ZOFRAN ODT) 4 MG disintegrating tablet Take 1 tablet (4 mg total) by mouth every 8 (eight) hours as needed for nausea or vomiting. Patient not taking: Reported on 04/14/2015 05/24/14   Samuella Cota, MD  Polyethyl Glycol-Propyl Glycol (SYSTANE) 0.4-0.3 % SOLN Place 1-2 drops into the left eye daily as needed (for dry eye relief).     Historical Provider, MD  potassium chloride SA (K-DUR,KLOR-CON) 20 MEQ tablet Take 20 mEq by mouth daily.    Historical Provider, MD  predniSONE (DELTASONE) 10 MG tablet Take 40 mg by mouth daily  for 4 days, then take 20 mg by mouth daily for 4 days, then take 10 mg by mouth daily for 4 days, then stop. Patient not taking: Reported on 04/14/2015 05/25/14   Samuella Cota, MD  senna (SENOKOT) 8.6 MG tablet Take 2 tablets by mouth 2 (two) times daily.    Historical Provider, MD    Social History:  reports that he quit smoking about 3 years ago. He does not have any smokeless tobacco history on file. He reports that he does not drink alcohol or use illicit drugs.  Family History  Problem Relation Age of Onset  . Heart failure Father   . Diabetes  Father   . Diabetes Mother   . Cancer Mother     male cancer    Danley Danker Weights   04/14/15 1906 04/14/15 1909  Weight: 78.019 kg (172 lb) 78.019 kg (172 lb)    All the positives are listed in BOLD  Review of Systems:  HEENT: Headache, blurred vision, runny nose, sore throat Neck: Hypothyroidism, hyperthyroidism,,lymphadenopathy Chest : Shortness of breath, history of COPD, Asthma Heart : Chest pain, history of coronary arterey disease GI:  Nausea, vomiting, diarrhea, constipation, GERD GU: Dysuria, urgency, frequency of urination, hematuria Neuro: Stroke, seizures, syncope Psych: Depression, anxiety, hallucinations   Physical Exam: Blood pressure 99/58, pulse 116, temperature 98.3 F (36.8 C), temperature source Oral, resp. rate 18, height 6\' 1"  (1.854 m), weight 78.019 kg (172 lb), SpO2 97 %. Constitutional:   Patient is a well-developed and well-nourished male in no acute distress and cooperative with exam. Head: Normocephalic and atraumatic Mouth: Mucus membranes moist Eyes: PERRL, EOMI, conjunctivae normal Neck: Supple, No Thyromegaly Cardiovascular: RRR, S1 normal, S2 normal Pulmonary/Chest: Bilateral rhonchi Abdominal: Soft. Non-tender, non-distended, bowel sounds are normal, no masses, organomegaly, or guarding present.  Neurological: A&O x3, Strength is normal and symmetric bilaterally, cranial nerve II-XII are grossly intact, no focal motor deficit, sensory intact to light touch bilaterally.  Extremities : No Cyanosis, Clubbing or Edema  Labs on Admission:  Basic Metabolic Panel:  Recent Labs Lab 04/14/15 2000  NA 131*  K 3.5  CL 91*  CO2 28  GLUCOSE 134*  BUN 25*  CREATININE 1.40*  CALCIUM 8.4*   Liver Function Tests:  Recent Labs Lab 04/14/15 2000  AST 31  ALT 24  ALKPHOS 78  BILITOT 0.6  PROT 7.5  ALBUMIN 3.6   CBC:  Recent Labs Lab 04/14/15 2000  WBC 12.6*  NEUTROABS 10.9*  HGB 14.8  HCT 42.0  MCV 91.9  PLT 149*     Radiological Exams on Admission: Dg Chest Portable 1 View  04/14/2015  CLINICAL DATA:  Shortness of breath, productive cough EXAM: PORTABLE CHEST 1 VIEW COMPARISON:  CT chest 05/23/2014 FINDINGS: There is right lower lobe airspace disease. There is hazy left lower lobe airspace disease. There is no pleural effusion or pneumothorax. The heart and mediastinal contours are unremarkable. The osseous structures are unremarkable. IMPRESSION: Bilateral lower lobe pneumonia, right greater than left. Followup PA and lateral chest X-ray is recommended in 3-4 weeks following trial of antibiotic therapy to ensure resolution and exclude underlying malignancy. Electronically Signed   By: Kathreen Devoid   On: 04/14/2015 20:03    EKG: Independently reviewed. Sinus tachycardia   Assessment/Plan Active Problems:   COPD (chronic obstructive pulmonary disease) (H[facility])   Pneumonia   CAP (community acquired pneumonia)   acute kidney injury   Community acquired pneumonia Admit the patient, start ceftriaxone and Zithromax per  pneumonia order sets Follow blood cultures. Sputum culture. Check HIV, strep pneumo urinary antigen, Legionella urinary antigen.  Acute kidney injury Likely from nausea vomiting and diarrhea., Poor by mouth intake Start IV fluids. Follow BMP in a.m.  COPD exacerbation Patient has a history of COPD and has been bilateral rhonchi We'll start DuoNeb nebulizers every 6 hours  Protruding lump in left chest wall Patient had a CT chest done in 2016 which showed subpleural atelectasis and scarring at exactly the same spot where he has protruding lump. Consider repeating CT chest once patient's kidney functions improved. Chest x-ray does not show any pneumothorax today.  Chronic back pain Continue oxycodone, MS Contin  DVT prophylaxis Lovenox  Code status: Full code  Family discussion: No family present at bedside   Time Spent on Admission: 60 minutes  Suhaylah Wampole S [facility] Pager: [phone] 04/14/2015, 9:20 PM  If 7PM-7AM, please contact night-coverage  [email or web address]  Password [email or web address]

## 2015-04-15 DIAGNOSIS — J189 Pneumonia, unspecified organism: Secondary | ICD-10-CM

## 2015-04-15 DIAGNOSIS — J438 Other emphysema: Secondary | ICD-10-CM

## 2015-04-15 LAB — CBC
HCT: 38.6 % — ABNORMAL LOW (ref 39.0–52.0)
HEMOGLOBIN: 12.7 g/dL — AB (ref 13.0–17.0)
MCH: 30.6 pg (ref 26.0–34.0)
MCHC: 32.9 g/dL (ref 30.0–36.0)
MCV: 93 fL (ref 78.0–100.0)
Platelets: 134 10*3/uL — ABNORMAL LOW (ref 150–400)
RBC: 4.15 MIL/uL — AB (ref 4.22–5.81)
RDW: 12.5 % (ref 11.5–15.5)
WBC: 9.5 10*3/uL (ref 4.0–10.5)

## 2015-04-15 LAB — MRSA PCR SCREENING: MRSA BY PCR: NEGATIVE

## 2015-04-15 LAB — COMPREHENSIVE METABOLIC PANEL
ALT: 21 U/L (ref 17–63)
AST: 29 U/L (ref 15–41)
Albumin: 3 g/dL — ABNORMAL LOW (ref 3.5–5.0)
Alkaline Phosphatase: 66 U/L (ref 38–126)
Anion gap: 8 (ref 5–15)
BILIRUBIN TOTAL: 0.5 mg/dL (ref 0.3–1.2)
BUN: 19 mg/dL (ref 6–20)
CALCIUM: 8 mg/dL — AB (ref 8.9–10.3)
CO2: 30 mmol/L (ref 22–32)
CREATININE: 1.11 mg/dL (ref 0.61–1.24)
Chloride: 94 mmol/L — ABNORMAL LOW (ref 101–111)
Glucose, Bld: 148 mg/dL — ABNORMAL HIGH (ref 65–99)
Potassium: 3.5 mmol/L (ref 3.5–5.1)
Sodium: 132 mmol/L — ABNORMAL LOW (ref 135–145)
Total Protein: 6.6 g/dL (ref 6.5–8.1)

## 2015-04-15 LAB — STREP PNEUMONIAE URINARY ANTIGEN: Strep Pneumo Urinary Antigen: NEGATIVE

## 2015-04-15 MED ORDER — SODIUM CHLORIDE 0.9 % IV BOLUS (SEPSIS)
1000.0000 mL | Freq: Once | INTRAVENOUS | Status: AC
  Administered 2015-04-15: 1000 mL via INTRAVENOUS

## 2015-04-15 MED ORDER — METHYLPREDNISOLONE SODIUM SUCC 125 MG IJ SOLR
80.0000 mg | Freq: Four times a day (QID) | INTRAMUSCULAR | Status: DC
  Administered 2015-04-15 – 2015-04-18 (×12): 80 mg via INTRAVENOUS
  Filled 2015-04-15 (×12): qty 2

## 2015-04-15 MED ORDER — BENZONATATE 100 MG PO CAPS
100.0000 mg | ORAL_CAPSULE | Freq: Two times a day (BID) | ORAL | Status: DC | PRN
  Administered 2015-04-15: 100 mg via ORAL
  Filled 2015-04-15: qty 1

## 2015-04-15 MED ORDER — DM-GUAIFENESIN ER 30-600 MG PO TB12
1.0000 | ORAL_TABLET | Freq: Two times a day (BID) | ORAL | Status: DC
  Administered 2015-04-15 – 2015-04-19 (×9): 1 via ORAL
  Filled 2015-04-15 (×9): qty 1

## 2015-04-15 MED ORDER — ONDANSETRON HCL 4 MG/2ML IJ SOLN
4.0000 mg | Freq: Four times a day (QID) | INTRAMUSCULAR | Status: DC | PRN
  Administered 2015-04-15: 4 mg via INTRAVENOUS
  Filled 2015-04-15: qty 2

## 2015-04-15 NOTE — Progress Notes (Signed)
[facility] for Renal Adjustment of ABX if needed  Allergies  Allergen Reactions  . Ambien [Zolpidem Tartrate] Other (See Comments)    hallucinations  . Melatonin Itching  . Zolpidem Other (See Comments)    Altered mental status   Patient Measurements: Height: 6\' 1"  (185.4 cm) Weight: 178 lb 5.6 oz (80.9 kg) IBW/kg (Calculated) : 79.9  Vital Signs: Temp: 97.8 F (36.6 C) (02/09 0800) Temp Source: Oral (02/09 0800) BP: 94/43 mmHg (02/09 0800) Pulse Rate: 87 (02/09 0800)  Labs:  Recent Labs  04/14/15 2000 04/15/15 0449  WBC 12.6* 9.5  HGB 14.8 12.7*  PLT 149* 134*  CREATININE 1.40* 1.11   No results for input(s): VANCOTROUGH, VANCOPEAK, VANCORANDOM, GENTTROUGH, GENTPEAK, GENTRANDOM, TOBRATROUGH, TOBRAPEAK, TOBRARND, AMIKACINPEAK, AMIKACINTROU, AMIKACIN in the last 72 hours.   Medical History: Past Medical History  Diagnosis Date  . COPD (chronic obstructive pulmonary disease) ([place])   . Osteoporosis   . Neuropathy ([place])   . Back pain   . Depression   . Avascular necrosis of femur (H[facility])    Assessment: Pt on Zithromax and Rocephin.  Renal adjustment not needed.  Estimated Creatinine Clearance: 88 mL/min (by C-G formula based on Cr of 1.11).  Plan: Continue current Rx Switch Zithromax to PO when appropriate  Ena Dawley, RPH 04/15/2015,11:06 AM

## 2015-04-15 NOTE — Care Management Note (Signed)
Case Management Note  Patient Details  Name: TARAJI IANNUCCI MRN: [id number] Date of Birth: 1965-04-22  Subjective/Objective:                  Pt admitted for COPD. Pt is from home, lives alone and is ind with ADL's. PT has home O2 concentrator with port tanks to use as needed. Pt has neb machine. Pt has no other DME or [place] services. Pt plans to return home at DC. Pt reports he is 'very weak" and wants to be considered for [facility] f/u at DC.   Action/Plan: Pt will need PT eval prior to DC, anticipate need for [facility]. Will cont to follow.   Expected Discharge Date:  04/17/15               Expected Discharge Plan:  Home/Self Care  In-House Referral:  NA  Discharge planning Services  CM Consult  Post Acute Care Choice:  NA Choice offered to:  NA  DME Arranged:    DME Agency:     HH Arranged:    HH Agency:     Status of Service:  In process, will continue to follow  Medicare Important Message Given:    Date Medicare IM Given:    Medicare IM give by:    Date Additional Medicare IM Given:    Additional Medicare Important Message give by:     If discussed at [place] of Stay Meetings, dates discussed:    Additional Comments:  Sherald Barge, RN 04/15/2015, 3:56 PM

## 2015-04-15 NOTE — Progress Notes (Signed)
[facility] PROGRESS NOTE  Wesley [name] [id number] DOB: 1965/10/27 DOA: 04/14/2015 PCP: [name], MD  Assessment/Plan: CAP -Agree with Rocephin and azithromycin. -Culture data negative to date. -Requesting cough medication.  COPD with Acute Exacerbation -Significant wheezing on exam today, agree with antibiotics, nebs, will add steroids.  Acute on Chronic Hypoxemic Respiratory Failure -Secondary to above. -Continue oxygen as required.  Hypotension -Resolved with fluid boluses.  ARF -Resolved with IV fluids.  Left Chest Wall Mass -We'll request CT chest today with contrast given improvement in renal function.  Chronic Back Pain -Continue MS Contin and oxycodone.  Code Status: Full code Family Communication: Patient only  Disposition Plan: Keep in ICU today   Consultants:  None   Antibiotics:  Rocephin  Azithromycin   Subjective: Still with significant shortness of breath, no chest pain  Objective: Filed Vitals:   04/15/15 1000 04/15/15 1100 04/15/15 1148 04/15/15 1200  BP: 76/45 94/59  96/64  Pulse: 95 92  109  Temp:      TempSrc:      Resp: 22 16  17   Height:      Weight:      SpO2: 96% 95% 98% 96%    Intake/Output Summary (Last 24 hours) at 04/15/15 1227 Last data filed at 04/15/15 0900  Gross per 24 hour  Intake 2223.33 ml  Output    700 ml  Net 1523.33 ml   Filed Weights   04/14/15 1906 04/14/15 1909 04/14/15 2356  Weight: 78.019 kg (172 lb) 78.019 kg (172 lb) 80.9 kg (178 lb 5.6 oz)    Exam:   General:  Alert, awake, oriented 3  Cardiovascular: Tachycardic, regular rhythm  Respiratory: Diffuse bilateral expiratory wheezing  Abdomen: Soft, nontender, nondistended, positive bowel sounds  Extremities: No clubbing, cyanosis or edema, positive pulses   Neurologic:  Grossly intact and nonfocal  Data Reviewed: Basic Metabolic Panel:  Recent Labs Lab 04/14/15 2000 04/15/15 0449  NA 131* 132*  K 3.5  3.5  CL 91* 94*  CO2 28 30  GLUCOSE 134* 148*  BUN 25* 19  CREATININE 1.40* 1.11  CALCIUM 8.4* 8.0*   Liver Function Tests:  Recent Labs Lab 04/14/15 2000 04/15/15 0449  AST 31 29  ALT 24 21  ALKPHOS 78 66  BILITOT 0.6 0.5  PROT 7.5 6.6  ALBUMIN 3.6 3.0*   No results for input(s): LIPASE, AMYLASE in the last 168 hours. No results for input(s): AMMONIA in the last 168 hours. CBC:  Recent Labs Lab 04/14/15 2000 04/15/15 0449  WBC 12.6* 9.5  NEUTROABS 10.9*  --   HGB 14.8 12.7*  HCT 42.0 38.6*  MCV 91.9 93.0  PLT 149* 134*   Cardiac Enzymes: No results for input(s): CKTOTAL, CKMB, CKMBINDEX, TROPONINI in the last 168 hours. BNP (last 3 results) No results for input(s): BNP in the last 8760 hours.  ProBNP (last 3 results) No results for input(s): PROBNP in the last 8760 hours.  CBG: No results for input(s): GLUCAP in the last 168 hours.  Recent Results (from the past 240 hour(s))  MRSA PCR Screening     Status: None   Collection Time: 04/14/15 11:45 PM  Result Value Ref Range Status   MRSA by PCR NEGATIVE NEGATIVE Final    Comment:        The GeneXpert MRSA Assay (FDA approved for NASAL specimens only), is one component of a comprehensive MRSA colonization surveillance program. It is not intended to diagnose MRSA infection nor to  guide or monitor treatment for MRSA infections.   Culture, blood (routine x 2) Call MD if unable to obtain prior to antibiotics being given     Status: None (Preliminary result)   Collection Time: 04/14/15 11:50 PM  Result Value Ref Range Status   Specimen Description BLOOD LEFT ANTECUBITAL  Final   Special Requests   Final    BOTTLES DRAWN AEROBIC AND ANAEROBIC AEB 10CC ANA [place]   Culture NO GROWTH < 12 HOURS  Final   Report Status PENDING  Incomplete  Culture, blood (routine x 2) Call MD if unable to obtain prior to antibiotics being given     Status: None (Preliminary result)   Collection Time: 04/15/15 12:07 AM  Result  Value Ref Range Status   Specimen Description BLOOD RIGHT ANTECUBITAL  Final   Special Requests   Final    BOTTLES DRAWN AEROBIC AND ANAEROBIC AEB 6CC ANA 10CC   Culture NO GROWTH < 12 HOURS  Final   Report Status PENDING  Incomplete     Studies: Dg Chest Portable 1 View  04/14/2015  CLINICAL DATA:  Shortness of breath, productive cough EXAM: PORTABLE CHEST 1 VIEW COMPARISON:  CT chest 05/23/2014 FINDINGS: There is right lower lobe airspace disease. There is hazy left lower lobe airspace disease. There is no pleural effusion or pneumothorax. The heart and mediastinal contours are unremarkable. The osseous structures are unremarkable. IMPRESSION: Bilateral lower lobe pneumonia, right greater than left. Followup PA and lateral chest X-ray is recommended in 3-4 weeks following trial of antibiotic therapy to ensure resolution and exclude underlying malignancy. Electronically Signed   By: Kathreen Devoid   On: 04/14/2015 20:03    Scheduled Meds: . azithromycin  500 mg Intravenous Q24H  . cefTRIAXone (ROCEPHIN)  IV  1 g Intravenous Q24H  . enoxaparin (LOVENOX) injection  40 mg Subcutaneous Q24H  . gabapentin  300 mg Oral QID  . morphine  30 mg Oral TID  . nicotine  14 mg Transdermal Daily  . senna  2 tablet Oral BID   Continuous Infusions: . sodium chloride 100 mL/hr at 04/15/15 0004  . sodium chloride 100 mL/hr at 04/15/15 1009    Active Problems:   COPD (chronic obstructive pulmonary disease) ([place])   Pneumonia   CAP (community acquired pneumonia)    Time spent: 25 minutes. Greater than 50% of this time was spent in direct contact with the patient coordinating care.    [name]  [facility] Pager [phone]  If 7PM-7AM, please contact night-coverage at [email or web address], password [facility] 04/15/2015, 12:27 PM  LOS: 1 day

## 2015-04-16 ENCOUNTER — Inpatient Hospital Stay (HOSPITAL_COMMUNITY): Payer: Medicare Other

## 2015-04-16 DIAGNOSIS — J432 Centrilobular emphysema: Secondary | ICD-10-CM

## 2015-04-16 LAB — HIV ANTIBODY (ROUTINE TESTING W REFLEX): HIV SCREEN 4TH GENERATION: NONREACTIVE

## 2015-04-16 LAB — LEGIONELLA ANTIGEN, URINE

## 2015-04-16 MED ORDER — IOHEXOL 300 MG/ML  SOLN
75.0000 mL | Freq: Once | INTRAMUSCULAR | Status: AC | PRN
  Administered 2015-04-16: 75 mL via INTRAVENOUS

## 2015-04-16 NOTE — Care Management Important Message (Signed)
Important Message  Patient Details  Name: Wesley [name] MRN: [id number] Date of Birth: Aug 02, 1965   Medicare Important Message Given:  Yes    Sherald Barge, RN 04/16/2015, 2:56 PM

## 2015-04-16 NOTE — Progress Notes (Signed)
Report called to [facility] May RN on 300. Patient ready for transport to the floor.

## 2015-04-16 NOTE — Progress Notes (Signed)
[facility] PROGRESS NOTE  DODD SCIANNA [id number] DOB: 1965/08/25 DOA: 04/14/2015 PCP: [name], MD  Assessment/Plan: CAP -Agree with Rocephin and azithromycin. -Culture data negative to date. -Strep pneumo urine antigen negative.  COPD with Acute Exacerbation -Wheezing has improved, remains tight on lung auscultation. -Continue IV steroids at current dose without titrating, nebs, mucolytics.  Acute on Chronic Hypoxemic Respiratory Failure -Secondary to above. -Continue oxygen as required.  Hypotension -Resolved with fluid boluses.  ARF -Resolved with IV fluids.  Left Chest Wall Mass -We'll request CT chest today with contrast given improvement in renal function.  Chronic Back Pain -Continue MS Contin and oxycodone. -Patient is requesting increased dose of pain meds. Hesitant given low BP and chronic pain syndrome.  Code Status: Full code Family Communication: Patient only  Disposition Plan: Transfer to floor.   Consultants:  None   Antibiotics:  Rocephin  Azithromycin   Subjective: Still with significant shortness of breath, no chest pain other than mild chest wall pain.  Objective: Filed Vitals:   04/16/15 0700 04/16/15 0758 04/16/15 0800 04/16/15 0900  BP: 108/54  109/56 112/62  Pulse: 80  46 96  Temp:  97.9 F (36.6 C)    TempSrc:  Axillary    Resp: 20  17 16   Height:      Weight:      SpO2: 96%  98% 100%    Intake/Output Summary (Last 24 hours) at 04/16/15 1048 Last data filed at 04/15/15 2200  Gross per 24 hour  Intake    360 ml  Output   1000 ml  Net   -640 ml   Filed Weights   04/14/15 1909 04/14/15 2356 04/16/15 0500  Weight: 78.019 kg (172 lb) 80.9 kg (178 lb 5.6 oz) 83.5 kg (184 lb 1.4 oz)    Exam:   General:  Alert, awake, oriented 3  Cardiovascular: Tachycardic, regular rhythm  Respiratory: Diffuse bilateral expiratory wheezing  Abdomen: Soft, nontender, nondistended, positive bowel  sounds  Extremities: No clubbing, cyanosis or edema, positive pulses   Neurologic:  Grossly intact and nonfocal  Data Reviewed: Basic Metabolic Panel:  Recent Labs Lab 04/14/15 2000 04/15/15 0449  NA 131* 132*  K 3.5 3.5  CL 91* 94*  CO2 28 30  GLUCOSE 134* 148*  BUN 25* 19  CREATININE 1.40* 1.11  CALCIUM 8.4* 8.0*   Liver Function Tests:  Recent Labs Lab 04/14/15 2000 04/15/15 0449  AST 31 29  ALT 24 21  ALKPHOS 78 66  BILITOT 0.6 0.5  PROT 7.5 6.6  ALBUMIN 3.6 3.0*   No results for input(s): LIPASE, AMYLASE in the last 168 hours. No results for input(s): AMMONIA in the last 168 hours. CBC:  Recent Labs Lab 04/14/15 2000 04/15/15 0449  WBC 12.6* 9.5  NEUTROABS 10.9*  --   HGB 14.8 12.7*  HCT 42.0 38.6*  MCV 91.9 93.0  PLT 149* 134*   Cardiac Enzymes: No results for input(s): CKTOTAL, CKMB, CKMBINDEX, TROPONINI in the last 168 hours. BNP (last 3 results) No results for input(s): BNP in the last 8760 hours.  ProBNP (last 3 results) No results for input(s): PROBNP in the last 8760 hours.  CBG: No results for input(s): GLUCAP in the last 168 hours.  Recent Results (from the past 240 hour(s))  MRSA PCR Screening     Status: None   Collection Time: 04/14/15 11:45 PM  Result Value Ref Range Status   MRSA by PCR NEGATIVE NEGATIVE Final  Comment:        The GeneXpert MRSA Assay (FDA approved for NASAL specimens only), is one component of a comprehensive MRSA colonization surveillance program. It is not intended to diagnose MRSA infection nor to guide or monitor treatment for MRSA infections.   Culture, blood (routine x 2) Call MD if unable to obtain prior to antibiotics being given     Status: None (Preliminary result)   Collection Time: 04/14/15 11:50 PM  Result Value Ref Range Status   Specimen Description BLOOD LEFT ANTECUBITAL  Final   Special Requests   Final    BOTTLES DRAWN AEROBIC AND ANAEROBIC AEB=10CC ANA=7CC   Culture NO GROWTH 1  DAY  Final   Report Status PENDING  Incomplete  Culture, blood (routine x 2) Call MD if unable to obtain prior to antibiotics being given     Status: None (Preliminary result)   Collection Time: 04/15/15 12:07 AM  Result Value Ref Range Status   Specimen Description BLOOD RIGHT ANTECUBITAL  Final   Special Requests   Final    BOTTLES DRAWN AEROBIC AND ANAEROBIC AEB=6CC ANA=10CC   Culture NO GROWTH 1 DAY  Final   Report Status PENDING  Incomplete     Studies: Dg Chest Portable 1 View  04/14/2015  CLINICAL DATA:  Shortness of breath, productive cough EXAM: PORTABLE CHEST 1 VIEW COMPARISON:  CT chest 05/23/2014 FINDINGS: There is right lower lobe airspace disease. There is hazy left lower lobe airspace disease. There is no pleural effusion or pneumothorax. The heart and mediastinal contours are unremarkable. The osseous structures are unremarkable. IMPRESSION: Bilateral lower lobe pneumonia, right greater than left. Followup PA and lateral chest X-ray is recommended in 3-4 weeks following trial of antibiotic therapy to ensure resolution and exclude underlying malignancy. Electronically Signed   By: Kathreen Devoid   On: 04/14/2015 20:03    Scheduled Meds: . azithromycin  500 mg Intravenous Q24H  . cefTRIAXone (ROCEPHIN)  IV  1 g Intravenous Q24H  . dextromethorphan-guaiFENesin  1 tablet Oral BID  . enoxaparin (LOVENOX) injection  40 mg Subcutaneous Q24H  . gabapentin  300 mg Oral QID  . methylPREDNISolone (SOLU-MEDROL) injection  80 mg Intravenous Q6H  . morphine  30 mg Oral TID  . nicotine  14 mg Transdermal Daily  . senna  2 tablet Oral BID   Continuous Infusions: . sodium chloride 100 mL/hr at 04/16/15 0905  . sodium chloride 100 mL/hr at 04/15/15 1009    Active Problems:   COPD (chronic obstructive pulmonary disease) ([place])   Pneumonia   CAP (community acquired pneumonia)    Time spent: 25 minutes. Greater than 50% of this time was spent in direct contact with the patient  coordinating care.    [name]  [facility] Pager [phone]  If 7PM-7AM, please contact night-coverage at [email or web address], password [facility] 04/16/2015, 10:48 AM  LOS: 2 days

## 2015-04-17 MED ORDER — BENZONATATE 100 MG PO CAPS
100.0000 mg | ORAL_CAPSULE | Freq: Three times a day (TID) | ORAL | Status: DC | PRN
  Administered 2015-04-17 – 2015-04-18 (×2): 100 mg via ORAL
  Filled 2015-04-17 (×2): qty 1

## 2015-04-17 MED ORDER — ALBUTEROL SULFATE (2.5 MG/3ML) 0.083% IN NEBU
2.5000 mg | INHALATION_SOLUTION | RESPIRATORY_TRACT | Status: DC | PRN
  Administered 2015-04-17 (×2): 2.5 mg via RESPIRATORY_TRACT
  Filled 2015-04-17 (×2): qty 3

## 2015-04-17 MED ORDER — AZITHROMYCIN 250 MG PO TABS
500.0000 mg | ORAL_TABLET | Freq: Every day | ORAL | Status: DC
  Administered 2015-04-17 – 2015-04-18 (×2): 500 mg via ORAL
  Filled 2015-04-17 (×2): qty 2

## 2015-04-17 MED ORDER — MORPHINE SULFATE (PF) 2 MG/ML IV SOLN
2.0000 mg | Freq: Once | INTRAVENOUS | Status: AC
  Administered 2015-04-17: 2 mg via INTRAVENOUS
  Filled 2015-04-17: qty 1

## 2015-04-17 MED ORDER — BUDESONIDE-FORMOTEROL FUMARATE 160-4.5 MCG/ACT IN AERO
2.0000 | INHALATION_SPRAY | Freq: Two times a day (BID) | RESPIRATORY_TRACT | Status: DC
Start: 2015-04-17 — End: 2015-04-19
  Administered 2015-04-17 – 2015-04-19 (×4): 2 via RESPIRATORY_TRACT
  Filled 2015-04-17: qty 6

## 2015-04-17 MED ORDER — IPRATROPIUM-ALBUTEROL 0.5-2.5 (3) MG/3ML IN SOLN
3.0000 mL | Freq: Four times a day (QID) | RESPIRATORY_TRACT | Status: DC
  Administered 2015-04-17 – 2015-04-18 (×6): 3 mL via RESPIRATORY_TRACT
  Filled 2015-04-17 (×6): qty 3

## 2015-04-17 MED ORDER — TIOTROPIUM BROMIDE MONOHYDRATE 18 MCG IN CAPS
18.0000 ug | ORAL_CAPSULE | Freq: Every day | RESPIRATORY_TRACT | Status: DC
Start: 2015-04-17 — End: 2015-04-19
  Administered 2015-04-18 – 2015-04-19 (×2): 18 ug via RESPIRATORY_TRACT
  Filled 2015-04-17: qty 5

## 2015-04-17 MED ORDER — FUROSEMIDE 20 MG PO TABS
10.0000 mg | ORAL_TABLET | Freq: Once | ORAL | Status: AC
  Administered 2015-04-17: 10 mg via ORAL
  Filled 2015-04-17: qty 1

## 2015-04-17 NOTE — Progress Notes (Signed)
Patient c/o pain to left side of rib cage and chest this am, rated 10/10 on 0-10 pain scale. MS contin 30 mg scheduled dose due at 1000. Oxycodone IR 30 mg ordered every 4 hours PRN pain. Pt requested to take both together this am, stated "I take them together in the morning at home." Notified Dr. Jerilee Hoh, stated okay to give them together. Will continue to monitor. Donavan Foil, RN

## 2015-04-17 NOTE — Progress Notes (Signed)
Late entry for 1115. Patient c/o worsened dypsnea and chest tightness. Stated "I'm tight and can't breathe." Lung sounds expiratory wheezes bilaterally upper and lower lobes. O2 sats 96% on 2 lpm. Notified RT for PRN neb treatment as ordered. Pt requested "my spiriva and simbicort like I take at home". Text-paged Dr. Jerilee Hoh to notify. Donavan Foil, RN

## 2015-04-17 NOTE — Progress Notes (Signed)
[facility] PROGRESS NOTE  Wesley [name] [id number] DOB: February 07, 1966 DOA: 04/14/2015 PCP: [name], MD  Assessment/Plan: CAP -Agree with Rocephin and azithromycin. -Culture data negative to date. -Strep pneumo urine antigen negative.  COPD with Acute Exacerbation -Wheezing has improved significantly overnight. -Continue IV steroids at current dose without titrating, nebs, mucolytics.  Acute on Chronic Hypoxemic Respiratory Failure -Secondary to above. -Continue oxygen as required.  Hypotension -Resolved with fluid boluses.  ARF -Resolved with IV fluids.  Left Chest Wall Mass CT chest without concerning findings.  Chronic Back Pain -Continue MS Contin and oxycodone. -Patient has requested a one-time dose of morphine, will comply.  Code Status: Full code Family Communication: Patient only  Disposition Plan: Discharge home once medically ready   Consultants:  None   Antibiotics:  Rocephin  Azithromycin   Subjective: Still with significant shortness of breath, no chest pain other than mild chest wall pain.  Objective: Filed Vitals:   04/17/15 1105 04/17/15 1107 04/17/15 1356 04/17/15 1500  BP:    123/66  Pulse:    81  Temp:    98.1 F (36.7 C)  TempSrc:      Resp:    18  Height:      Weight:      SpO2: 96% 93% 96% 94%    Intake/Output Summary (Last 24 hours) at 04/17/15 1554 Last data filed at 04/17/15 1229  Gross per 24 hour  Intake    840 ml  Output   2500 ml  Net  -1660 ml   Filed Weights   04/14/15 1909 04/14/15 2356 04/16/15 0500  Weight: 78.019 kg (172 lb) 80.9 kg (178 lb 5.6 oz) 83.5 kg (184 lb 1.4 oz)    Exam:   General:  Alert, awake, oriented 3  Cardiovascular: Tachycardic, regular rhythm  Respiratory: Diffuse bilateral expiratory wheezing  Abdomen: Soft, nontender, nondistended, positive bowel sounds  Extremities: No clubbing, cyanosis or edema, positive pulses   Neurologic:  Grossly intact and  nonfocal  Data Reviewed: Basic Metabolic Panel:  Recent Labs Lab 04/14/15 2000 04/15/15 0449  NA 131* 132*  K 3.5 3.5  CL 91* 94*  CO2 28 30  GLUCOSE 134* 148*  BUN 25* 19  CREATININE 1.40* 1.11  CALCIUM 8.4* 8.0*   Liver Function Tests:  Recent Labs Lab 04/14/15 2000 04/15/15 0449  AST 31 29  ALT 24 21  ALKPHOS 78 66  BILITOT 0.6 0.5  PROT 7.5 6.6  ALBUMIN 3.6 3.0*   No results for input(s): LIPASE, AMYLASE in the last 168 hours. No results for input(s): AMMONIA in the last 168 hours. CBC:  Recent Labs Lab 04/14/15 2000 04/15/15 0449  WBC 12.6* 9.5  NEUTROABS 10.9*  --   HGB 14.8 12.7*  HCT 42.0 38.6*  MCV 91.9 93.0  PLT 149* 134*   Cardiac Enzymes: No results for input(s): CKTOTAL, CKMB, CKMBINDEX, TROPONINI in the last 168 hours. BNP (last 3 results) No results for input(s): BNP in the last 8760 hours.  ProBNP (last 3 results) No results for input(s): PROBNP in the last 8760 hours.  CBG: No results for input(s): GLUCAP in the last 168 hours.  Recent Results (from the past 240 hour(s))  MRSA PCR Screening     Status: None   Collection Time: 04/14/15 11:45 PM  Result Value Ref Range Status   MRSA by PCR NEGATIVE NEGATIVE Final    Comment:        The GeneXpert MRSA Assay (FDA approved for  NASAL specimens only), is one component of a comprehensive MRSA colonization surveillance program. It is not intended to diagnose MRSA infection nor to guide or monitor treatment for MRSA infections.   Culture, blood (routine x 2) Call MD if unable to obtain prior to antibiotics being given     Status: None (Preliminary result)   Collection Time: 04/14/15 11:50 PM  Result Value Ref Range Status   Specimen Description BLOOD LEFT ANTECUBITAL  Final   Special Requests   Final    BOTTLES DRAWN AEROBIC AND ANAEROBIC AEB=10CC ANA=7CC   Culture NO GROWTH 2 DAYS  Final   Report Status PENDING  Incomplete  Culture, blood (routine x 2) Call MD if unable to  obtain prior to antibiotics being given     Status: None (Preliminary result)   Collection Time: 04/15/15 12:07 AM  Result Value Ref Range Status   Specimen Description BLOOD RIGHT ANTECUBITAL  Final   Special Requests   Final    BOTTLES DRAWN AEROBIC AND ANAEROBIC AEB=6CC ANA=10CC   Culture NO GROWTH 2 DAYS  Final   Report Status PENDING  Incomplete     Studies: Ct Chest W Contrast  04/16/2015  CLINICAL DATA:  Abnormal chest radiograph question mass, cough, shortness of breath, vomiting, COPD EXAM: CT CHEST WITH CONTRAST TECHNIQUE: Multidetector CT imaging of the chest was performed during intravenous contrast administration. Sagittal and coronal MPR images reconstructed from axial data set. CONTRAST:  60mL OMNIPAQUE IOHEXOL 300 MG/ML  SOLN IV COMPARISON:  05/23/2014; correlation chest radiograph 04/14/2015 FINDINGS: Vascular structures grossly patent on nondedicated exam. Few scattered normal sized mediastinal lymph nodes. Single minimally enlarged RIGHT hilar node 12 mm short axis image 33, likely reactive in the setting of acute infiltrate. Minimal pericardial fluid or thickening. Visualized upper abdomen unremarkable. Severe emphysematous changes with bullous disease at the apices. Mild central peribronchial thickening. BILATERAL upper lobe scarring. Airspace infiltrate identified in RIGHT upper lobe, RIGHT middle lobe and RIGHT lower lobe, less in LEFT lung, most consistent with infection. More confluent opacity laterally in RIGHT upper lobe and at anterior RIGHT middle lobe base. No pleural effusion or pneumothorax. Bones demineralized. IMPRESSION: Changes of COPD with severe bullous disease in the upper lobes and scattered areas of scarring. Patchy airspace infiltrates are identified bilaterally, most severe in RIGHT upper and RIGHT middle lobes, consistent with pneumonia. Due to confluent nature of infiltrates in particularly the RIGHT upper lobe laterally, followup chest radiographs recommended  in 3 weeks to ensure resolution and exclude underlying tumor. Electronically Signed   By: Lavonia Dana M.D.   On: 04/16/2015 12:35    Scheduled Meds: . azithromycin  500 mg Oral QHS  . budesonide-formoterol  2 puff Inhalation BID  . cefTRIAXone (ROCEPHIN)  IV  1 g Intravenous Q24H  . dextromethorphan-guaiFENesin  1 tablet Oral BID  . enoxaparin (LOVENOX) injection  40 mg Subcutaneous Q24H  . gabapentin  300 mg Oral QID  . ipratropium-albuterol  3 mL Nebulization Q6H  . methylPREDNISolone (SOLU-MEDROL) injection  80 mg Intravenous Q6H  . morphine  30 mg Oral TID  . nicotine  14 mg Transdermal Daily  . senna  2 tablet Oral BID  . tiotropium  18 mcg Inhalation Daily   Continuous Infusions: . sodium chloride 100 mL/hr at 04/16/15 0905  . sodium chloride 100 mL/hr at 04/17/15 [id number]    Active Problems:   COPD (chronic obstructive pulmonary disease) ([facility])   Pneumonia   CAP (community acquired pneumonia)    Time spent:  25 minutes. Greater than 50% of this time was spent in direct contact with the patient coordinating care.    [name]  [facility] Pager [phone]  If 7PM-7AM, please contact night-coverage at [email or web address], password [facility] 04/17/2015, 3:54 PM  LOS: 3 days

## 2015-04-18 ENCOUNTER — Encounter (HOSPITAL_COMMUNITY): Payer: Self-pay | Admitting: Anesthesiology

## 2015-04-18 MED ORDER — IPRATROPIUM-ALBUTEROL 0.5-2.5 (3) MG/3ML IN SOLN: 3.0000 mL | Freq: Four times a day (QID) | RESPIRATORY_TRACT | Status: DC

## 2015-04-18 MED ORDER — METHYLPREDNISOLONE SODIUM SUCC 125 MG IJ SOLR
80.0000 mg | Freq: Two times a day (BID) | INTRAMUSCULAR | Status: DC
  Administered 2015-04-18 – 2015-04-19 (×2): 80 mg via INTRAVENOUS
  Filled 2015-04-18 (×2): qty 2

## 2015-04-18 MED ORDER — ALBUTEROL SULFATE (2.5 MG/3ML) 0.083% IN NEBU
2.5000 mg | INHALATION_SOLUTION | Freq: Four times a day (QID) | RESPIRATORY_TRACT | Status: DC
  Administered 2015-04-18 – 2015-04-19 (×5): 2.5 mg via RESPIRATORY_TRACT
  Filled 2015-04-18 (×5): qty 3

## 2015-04-18 NOTE — Progress Notes (Signed)
[facility] PROGRESS NOTE  Wesley [name] [id number] DOB: Sep 15, 1965 DOA: 04/14/2015 PCP: Wesley Burly, MD  Assessment/Plan: CAP -Agree with Rocephin and azithromycin. -Culture data negative to date. -Strep pneumo/legionella urine antigen negative.  COPD with Acute Exacerbation -Wheezing has improved significantly overnight. -Start titrating IV steroids, continue nebs, mucolytics.  Acute on Chronic Hypoxemic Respiratory Failure -Secondary to above. -Continue oxygen as required.  Hypotension -Resolved with fluid boluses.  ARF -Resolved with IV fluids.  Left Chest Wall Mass -CT chest without concerning findings.  Chronic Back Pain -Continue MS Contin and oxycodone. -Patient has requested a one-time dose of morphine, will comply.  Code Status: Full code Family Communication: Patient only  Disposition Plan: Discharge home once medically ready; hopefully in 24-48 hours   Consultants:  None   Antibiotics:  Rocephin  Azithromycin   Subjective: Still with significant shortness of breath, no chest pain other than mild chest wall pain.  Objective: Filed Vitals:   04/17/15 2104 04/18/15 0236 04/18/15 0547 04/18/15 0752  BP: 144/71  136/70   Pulse: 100  61   Temp: 97.7 F (36.5 C)  97.9 F (36.6 C)   TempSrc: Oral  Oral   Resp: 19  20   Height:      Weight:      SpO2: 95% 96% 92% 92%    Intake/Output Summary (Last 24 hours) at 04/18/15 1247 Last data filed at 04/18/15 1202  Gross per 24 hour  Intake      0 ml  Output   4150 ml  Net  -4150 ml   Filed Weights   04/14/15 1909 04/14/15 2356 04/16/15 0500  Weight: 78.019 kg (172 lb) 80.9 kg (178 lb 5.6 oz) 83.5 kg (184 lb 1.4 oz)    Exam:   General:  Alert, awake, oriented 3  Cardiovascular: Tachycardic, regular rhythm  Respiratory: Mild expiratory wheezes, improved  Abdomen: Soft, nontender, nondistended, positive bowel sounds  Extremities: No clubbing, cyanosis or edema,  positive pulses   Neurologic:  Grossly intact and nonfocal  Data Reviewed: Basic Metabolic Panel:  Recent Labs Lab 04/14/15 2000 04/15/15 0449  NA 131* 132*  K 3.5 3.5  CL 91* 94*  CO2 28 30  GLUCOSE 134* 148*  BUN 25* 19  CREATININE 1.40* 1.11  CALCIUM 8.4* 8.0*   Liver Function Tests:  Recent Labs Lab 04/14/15 2000 04/15/15 0449  AST 31 29  ALT 24 21  ALKPHOS 78 66  BILITOT 0.6 0.5  PROT 7.5 6.6  ALBUMIN 3.6 3.0*   No results for input(s): LIPASE, AMYLASE in the last 168 hours. No results for input(s): AMMONIA in the last 168 hours. CBC:  Recent Labs Lab 04/14/15 2000 04/15/15 0449  WBC 12.6* 9.5  NEUTROABS 10.9*  --   HGB 14.8 12.7*  HCT 42.0 38.6*  MCV 91.9 93.0  PLT 149* 134*   Cardiac Enzymes: No results for input(s): CKTOTAL, CKMB, CKMBINDEX, TROPONINI in the last 168 hours. BNP (last 3 results) No results for input(s): BNP in the last 8760 hours.  ProBNP (last 3 results) No results for input(s): PROBNP in the last 8760 hours.  CBG: No results for input(s): GLUCAP in the last 168 hours.  Recent Results (from the past 240 hour(s))  MRSA PCR Screening     Status: None   Collection Time: 04/14/15 11:45 PM  Result Value Ref Range Status   MRSA by PCR NEGATIVE NEGATIVE Final    Comment:        The  GeneXpert MRSA Assay (FDA approved for NASAL specimens only), is one component of a comprehensive MRSA colonization surveillance program. It is not intended to diagnose MRSA infection nor to guide or monitor treatment for MRSA infections.   Culture, blood (routine x 2) Call MD if unable to obtain prior to antibiotics being given     Status: None (Preliminary result)   Collection Time: 04/14/15 11:50 PM  Result Value Ref Range Status   Specimen Description BLOOD LEFT ANTECUBITAL  Final   Special Requests   Final    BOTTLES DRAWN AEROBIC AND ANAEROBIC AEB=10CC ANA=7CC   Culture NO GROWTH 2 DAYS  Final   Report Status PENDING  Incomplete   Culture, blood (routine x 2) Call MD if unable to obtain prior to antibiotics being given     Status: None (Preliminary result)   Collection Time: 04/15/15 12:07 AM  Result Value Ref Range Status   Specimen Description BLOOD RIGHT ANTECUBITAL  Final   Special Requests   Final    BOTTLES DRAWN AEROBIC AND ANAEROBIC AEB=6CC ANA=10CC   Culture NO GROWTH 2 DAYS  Final   Report Status PENDING  Incomplete     Studies: No results found.  Scheduled Meds: . azithromycin  500 mg Oral QHS  . budesonide-formoterol  2 puff Inhalation BID  . cefTRIAXone (ROCEPHIN)  IV  1 g Intravenous Q24H  . dextromethorphan-guaiFENesin  1 tablet Oral BID  . enoxaparin (LOVENOX) injection  40 mg Subcutaneous Q24H  . gabapentin  300 mg Oral QID  . ipratropium-albuterol  3 mL Nebulization QID  . methylPREDNISolone (SOLU-MEDROL) injection  80 mg Intravenous Q6H  . morphine  30 mg Oral TID  . nicotine  14 mg Transdermal Daily  . senna  2 tablet Oral BID  . tiotropium  18 mcg Inhalation Daily   Continuous Infusions: . sodium chloride 100 mL/hr at 04/18/15 [id number]    Active Problems:   COPD (chronic obstructive pulmonary disease) ([facility])   Pneumonia   CAP (community acquired pneumonia)    Time spent: 25 minutes. Greater than 50% of this time was spent in direct contact with the patient coordinating care.    Wesley [name]  [facility] Pager [phone]  If 7PM-7AM, please contact night-coverage at [email or web address], password [facility] 04/18/2015, 12:47 PM  LOS: 4 days

## 2015-04-19 MED ORDER — PREDNISONE 10 MG PO TABS: 10.0000 mg | ORAL_TABLET | Freq: Every day | ORAL | Status: DC

## 2015-04-19 MED ORDER — BENZONATATE 100 MG PO CAPS: 100.0000 mg | ORAL_CAPSULE | Freq: Three times a day (TID) | ORAL | Status: DC | PRN

## 2015-04-19 MED ORDER — AMOXICILLIN-POT CLAVULANATE 875-125 MG PO TABS: 1.0000 | ORAL_TABLET | Freq: Two times a day (BID) | ORAL | Status: DC

## 2015-04-19 MED ORDER — DM-GUAIFENESIN ER 30-600 MG PO TB12: 1.0000 | ORAL_TABLET | Freq: Two times a day (BID) | ORAL | Status: DC

## 2015-04-19 NOTE — Discharge Summary (Signed)
Physician Discharge Summary  Wesley [name] [id number] DOB: [birth year] DOA: 04/14/2015  PCP: [name], MD  Admit date: 04/14/2015 Discharge date: 04/19/2015  Time spent: 45 minutes  Recommendations for Outpatient Follow-up:  -We'll be discharged home today. -Advised to follow-up with primary care provider in 2 weeks.   Discharge Diagnoses:  Active Problems:   COPD (chronic obstructive pulmonary disease) (HCC)   Pneumonia   CAP (community acquired pneumonia)   Discharge Condition: Stable and improved  Filed Weights   04/14/15 1909 04/14/15 2356 04/16/15 0500  Weight: 78.019 kg (172 lb) 80.9 kg (178 lb 5.6 oz) 83.5 kg (184 lb 1.4 oz)    History of present illness:  As per Dr. Darrick Meigs on 33/50: 50 year old male who  has a past medical history of COPD (chronic obstructive pulmonary disease) ([place]); Osteoporosis; Neuropathy ([place]); Back pain; Depression; and Avascular necrosis of femur ([place]). today presents to the hospital with shortness of breath. Also had nausea vomiting and diarrhea for past few days but complains of coughing and bringing up yellow-colored phlegm. Patient also had fever. Denies any chills. Patient also has noticed a lump on the left side of the chest which protrudes when he coughs. Patient had bilateral lung surgery at [facility]. He takes MS Contin and oxycodone for chronic back pain. In the ED chest x-ray showed bilateral pneumonia right more than left. Patient started on ceftriaxone and Zithromax.  Hospital Course:   CAP -We'll discharge home on 5 more days of Augmentin to complete an 8 day course of treatment for his pneumonia. -Culture data negative to date. -Strep pneumo/legionella urine antigen negative.  COPD with Acute Exacerbation -Wheezing has improved significantly overnight. -Discharged home on a prednisone taper, continue nebs, mucolytics.  Acute on Chronic Hypoxemic Respiratory Failure -Secondary to above. -Continue oxygen as  required.  Hypotension -Resolved with fluid boluses.  ARF -Resolved with IV fluids.  Left Chest Wall Mass -CT chest without concerning findings.  Chronic Back Pain -Continue MS Contin and oxycodone. -None have been prescribed at time of discharge.   Procedures:  None   Consultations:  None  Discharge Instructions  Discharge Instructions    Diet - low sodium heart healthy    Complete by:  As directed      Increase activity slowly    Complete by:  As directed             Medication List    STOP taking these medications        Naloxone HCl 0.4 MG/0.4ML Soaj     potassium chloride SA 20 MEQ tablet  Commonly known as:  K-DUR,KLOR-CON      TAKE these medications        albuterol 108 (90 Base) MCG/ACT inhaler  Commonly known as:  PROVENTIL HFA;VENTOLIN HFA  Inhale 2 puffs into the lungs every 4 (four) hours as needed for wheezing or shortness of breath. Shortness of breath     albuterol (2.5 MG/3ML) 0.083% nebulizer solution  Commonly known as:  PROVENTIL  Take 3 mLs (2.5 mg total) by nebulization every 6 (six) hours as needed for wheezing or shortness of breath.     amoxicillin-clavulanate 875-125 MG tablet  Commonly known as:  AUGMENTIN  Take 1 tablet by mouth 2 (two) times daily.     benzonatate 100 MG capsule  Commonly known as:  TESSALON  Take 1 capsule (100 mg total) by mouth 3 (three) times daily as needed for cough.     budesonide-formoterol 160-4.5 MCG/ACT  inhaler  Commonly known as:  SYMBICORT  Inhale 3 puffs into the lungs 4 (four) times daily.     dextromethorphan-guaiFENesin 30-600 MG 12hr tablet  Commonly known as:  MUCINEX DM  Take 1 tablet by mouth 2 (two) times daily.     gabapentin 300 MG capsule  Commonly known as:  NEURONTIN  Take 300 mg by mouth 4 (four) times daily.     morphine 30 MG 12 hr tablet  Commonly known as:  MS CONTIN  Take 30 mg by mouth 3 (three) times daily.     nicotine 14 mg/24hr patch  Commonly known as:   NICODERM CQ - dosed in mg/24 hours  Place 14 mg onto the skin daily.     ondansetron 4 MG disintegrating tablet  Commonly known as:  ZOFRAN ODT  Take 1 tablet (4 mg total) by mouth every 8 (eight) hours as needed for nausea or vomiting.     oxycodone 30 MG immediate release tablet  Commonly known as:  ROXICODONE  Take 30 mg by mouth every 4 (four) hours as needed for pain.     predniSONE 10 MG tablet  Commonly known as:  DELTASONE  Take 1 tablet (10 mg total) by mouth daily with breakfast. Take 6 tablets today and then decrease by 1 tablet daily until none are left.     senna 8.6 MG tablet  Commonly known as:  SENOKOT  Take 2 tablets by mouth 2 (two) times daily.     SYSTANE 0.4-0.3 % Soln  Generic drug:  Polyethyl Glycol-Propyl Glycol  Place 1-2 drops into the left eye daily as needed (for dry eye relief).     tiotropium 18 MCG inhalation capsule  Commonly known as:  SPIRIVA  Place 1 capsule (18 mcg total) into inhaler and inhale daily.       Allergies  Allergen Reactions  . Ambien [Zolpidem Tartrate] Other (See Comments)    hallucinations  . Melatonin Itching  . Zolpidem Other (See Comments)    Altered mental status       Follow-up Information    Follow up with [name], MD. Schedule an appointment as soon as possible for a visit in 2 weeks.   Specialty:  Internal Medicine   Contact information:   [place] [id number] [phone]        The results of significant diagnostics from this hospitalization (including imaging, microbiology, ancillary and laboratory) are listed below for reference.    Significant Diagnostic Studies: Ct Chest W Contrast  04/16/2015  CLINICAL DATA:  Abnormal chest radiograph question mass, cough, shortness of breath, vomiting, COPD EXAM: CT CHEST WITH CONTRAST TECHNIQUE: Multidetector CT imaging of the chest was performed during intravenous contrast administration. Sagittal and coronal MPR images reconstructed  from axial data set. CONTRAST:  69mL OMNIPAQUE IOHEXOL 300 MG/ML  SOLN IV COMPARISON:  05/23/2014; correlation chest radiograph 04/14/2015 FINDINGS: Vascular structures grossly patent on nondedicated exam. Few scattered normal sized mediastinal lymph nodes. Single minimally enlarged RIGHT hilar node 12 mm short axis image 33, likely reactive in the setting of acute infiltrate. Minimal pericardial fluid or thickening. Visualized upper abdomen unremarkable. Severe emphysematous changes with bullous disease at the apices. Mild central peribronchial thickening. BILATERAL upper lobe scarring. Airspace infiltrate identified in RIGHT upper lobe, RIGHT middle lobe and RIGHT lower lobe, less in LEFT lung, most consistent with infection. More confluent opacity laterally in RIGHT upper lobe and at anterior RIGHT middle lobe base. No pleural  effusion or pneumothorax. Bones demineralized. IMPRESSION: Changes of COPD with severe bullous disease in the upper lobes and scattered areas of scarring. Patchy airspace infiltrates are identified bilaterally, most severe in RIGHT upper and RIGHT middle lobes, consistent with pneumonia. Due to confluent nature of infiltrates in particularly the RIGHT upper lobe laterally, followup chest radiographs recommended in 3 weeks to ensure resolution and exclude underlying tumor. Electronically Signed   By: Lavonia Dana M.D.   On: 04/16/2015 12:35   Dg Chest Portable 1 View  04/14/2015  CLINICAL DATA:  Shortness of breath, productive cough EXAM: PORTABLE CHEST 1 VIEW COMPARISON:  CT chest 05/23/2014 FINDINGS: There is right lower lobe airspace disease. There is hazy left lower lobe airspace disease. There is no pleural effusion or pneumothorax. The heart and mediastinal contours are unremarkable. The osseous structures are unremarkable. IMPRESSION: Bilateral lower lobe pneumonia, right greater than left. Followup PA and lateral chest X-ray is recommended in 3-4 weeks following trial of antibiotic  therapy to ensure resolution and exclude underlying malignancy. Electronically Signed   By: Kathreen Devoid   On: 04/14/2015 20:03    Microbiology: Recent Results (from the past 240 hour(s))  MRSA PCR Screening     Status: None   Collection Time: 04/14/15 11:45 PM  Result Value Ref Range Status   MRSA by PCR NEGATIVE NEGATIVE Final    Comment:        The GeneXpert MRSA Assay (FDA approved for NASAL specimens only), is one component of a comprehensive MRSA colonization surveillance program. It is not intended to diagnose MRSA infection nor to guide or monitor treatment for MRSA infections.   Culture, blood (routine x 2) Call MD if unable to obtain prior to antibiotics being given     Status: None (Preliminary result)   Collection Time: 04/14/15 11:50 PM  Result Value Ref Range Status   Specimen Description BLOOD LEFT ANTECUBITAL  Final   Special Requests   Final    BOTTLES DRAWN AEROBIC AND ANAEROBIC AEB=10CC ANA=7CC   Culture NO GROWTH 4 DAYS  Final   Report Status PENDING  Incomplete  Culture, blood (routine x 2) Call MD if unable to obtain prior to antibiotics being given     Status: None (Preliminary result)   Collection Time: 04/15/15 12:07 AM  Result Value Ref Range Status   Specimen Description BLOOD RIGHT ANTECUBITAL  Final   Special Requests   Final    BOTTLES DRAWN AEROBIC AND ANAEROBIC AEB=6CC ANA=10CC   Culture NO GROWTH 4 DAYS  Final   Report Status PENDING  Incomplete     Labs: Basic Metabolic Panel:  Recent Labs Lab 04/14/15 2000 04/15/15 0449  NA 131* 132*  K 3.5 3.5  CL 91* 94*  CO2 28 30  GLUCOSE 134* 148*  BUN 25* 19  CREATININE 1.40* 1.11  CALCIUM 8.4* 8.0*   Liver Function Tests:  Recent Labs Lab 04/14/15 2000 04/15/15 0449  AST 31 29  ALT 24 21  ALKPHOS 78 66  BILITOT 0.6 0.5  PROT 7.5 6.6  ALBUMIN 3.6 3.0*   No results for input(s): LIPASE, AMYLASE in the last 168 hours. No results for input(s): AMMONIA in the last 168  hours. CBC:  Recent Labs Lab 04/14/15 2000 04/15/15 0449  WBC 12.6* 9.5  NEUTROABS 10.9*  --   HGB 14.8 12.7*  HCT 42.0 38.6*  MCV 91.9 93.0  PLT 149* 134*   Cardiac Enzymes: No results for input(s): CKTOTAL, CKMB, CKMBINDEX, TROPONINI in the last  168 hours. BNP: BNP (last 3 results) No results for input(s): BNP in the last 8760 hours.  ProBNP (last 3 results) No results for input(s): PROBNP in the last 8760 hours.  CBG: No results for input(s): GLUCAP in the last 168 hours.     SignedLelon Frohlich  [facility] Pager: [phone] 04/19/2015, 2:59 PM

## 2015-04-19 NOTE — Care Management Important Message (Signed)
Important Message  Patient Details  Name: Wesley [name] MRN: [id number] Date of Birth: Feb 22, 1966   Medicare Important Message Given:  Yes    Alvie Heidelberg, RN 04/19/2015, 4:07 PM

## 2015-04-19 NOTE — Progress Notes (Signed)
Pt IV and telemetry removed, tolerated well.  Reviewed discharge instructions and prescriptions with pt.  Answered all questions at this time.

## 2015-04-19 NOTE — Care Management (Addendum)
Spoke with patient who was sitting up on side of bed. Lives with spouse. Patient stated that he has walker although he uses a cone mostly . He stated that his breathing is much improved. Has Home O2 with [employer]. Patient denies any needs at this time. Patient self reports that he has ambulated and that he did well.  No CM needs identified at this time.

## 2015-04-20 LAB — CULTURE, BLOOD (ROUTINE X 2)
CULTURE: NO GROWTH
CULTURE: NO GROWTH

## 2015-04-21 ENCOUNTER — Encounter (HOSPITAL_COMMUNITY): Admission: RE | Payer: Self-pay | Source: Ambulatory Visit

## 2015-04-21 ENCOUNTER — Inpatient Hospital Stay (HOSPITAL_COMMUNITY): Admission: RE | Admit: 2015-04-21 | Payer: Medicare Other | Source: Ambulatory Visit | Admitting: Orthopedic Surgery

## 2015-04-21 SURGERY — ARTHROPLASTY, HIP, TOTAL, ANTERIOR APPROACH
Anesthesia: Choice | Laterality: Right

## 2015-08-26 ENCOUNTER — Other Ambulatory Visit: Payer: Self-pay

## 2015-08-26 ENCOUNTER — Emergency Department (HOSPITAL_COMMUNITY)
Admission: EM | Admit: 2015-08-26 | Discharge: 2015-08-26 | Disposition: A | Discharge status: Home / Self Care | Payer: Medicare Other | Attending: Emergency Medicine | Admitting: Emergency Medicine

## 2015-08-26 ENCOUNTER — Encounter (HOSPITAL_COMMUNITY): Payer: Self-pay | Admitting: Emergency Medicine

## 2015-08-26 ENCOUNTER — Emergency Department (HOSPITAL_COMMUNITY): Payer: Medicare Other

## 2015-08-26 DIAGNOSIS — Z79899 Other long term (current) drug therapy: Secondary | ICD-10-CM | POA: Insufficient documentation

## 2015-08-26 DIAGNOSIS — J441 Chronic obstructive pulmonary disease with (acute) exacerbation: Secondary | ICD-10-CM | POA: Diagnosis not present

## 2015-08-26 DIAGNOSIS — F329 Major depressive disorder, single episode, unspecified: Secondary | ICD-10-CM | POA: Diagnosis not present

## 2015-08-26 DIAGNOSIS — Z87891 Personal history of nicotine dependence: Secondary | ICD-10-CM | POA: Insufficient documentation

## 2015-08-26 DIAGNOSIS — M810 Age-related osteoporosis without current pathological fracture: Secondary | ICD-10-CM | POA: Insufficient documentation

## 2015-08-26 DIAGNOSIS — R0602 Shortness of breath: Secondary | ICD-10-CM | POA: Diagnosis present

## 2015-08-26 HISTORY — DX: Pneumoconiosis due to asbestos and other mineral fibers: J61

## 2015-08-26 LAB — CBC WITH DIFFERENTIAL/PLATELET
Basophils Absolute: 0 10*3/uL (ref 0.0–0.1)
Basophils Relative: 0 %
Eosinophils Absolute: 0.3 10*3/uL (ref 0.0–0.7)
Eosinophils Relative: 3 %
HCT: 47.1 % (ref 39.0–52.0)
Hemoglobin: 16.1 g/dL (ref 13.0–17.0)
Lymphocytes Relative: 16 %
Lymphs Abs: 1.5 10*3/uL (ref 0.7–4.0)
MCH: 31.1 pg (ref 26.0–34.0)
MCHC: 34.2 g/dL (ref 30.0–36.0)
MCV: 91.1 fL (ref 78.0–100.0)
Monocytes Absolute: 0.9 10*3/uL (ref 0.1–1.0)
Monocytes Relative: 9 %
Neutro Abs: 6.6 10*3/uL (ref 1.7–7.7)
Neutrophils Relative %: 72 %
Platelets: 325 10*3/uL (ref 150–400)
RBC: 5.17 MIL/uL (ref 4.22–5.81)
RDW: 11.8 % (ref 11.5–15.5)
WBC: 9.2 10*3/uL (ref 4.0–10.5)

## 2015-08-26 LAB — COMPREHENSIVE METABOLIC PANEL
ALT: 18 U/L (ref 17–63)
AST: 14 U/L — ABNORMAL LOW (ref 15–41)
Albumin: 4.3 g/dL (ref 3.5–5.0)
Alkaline Phosphatase: 90 U/L (ref 38–126)
Anion gap: 7 (ref 5–15)
BUN: 9 mg/dL (ref 6–20)
CO2: 27 mmol/L (ref 22–32)
Calcium: 9 mg/dL (ref 8.9–10.3)
Chloride: 104 mmol/L (ref 101–111)
Creatinine, Ser: 0.74 mg/dL (ref 0.61–1.24)
GFR calc Af Amer: >60 mL/min (ref >60)
GFR calc non Af Amer: >60 mL/min (ref >60)
Glucose, Bld: 93 mg/dL (ref 65–99)
Potassium: 4 mmol/L (ref 3.5–5.1)
Sodium: 138 mmol/L (ref 135–145)
Total Bilirubin: 0.8 mg/dL (ref 0.3–1.2)
Total Protein: 7.6 g/dL (ref 6.5–8.1)

## 2015-08-26 LAB — TROPONIN I: Troponin I: <0.03 ng/mL (ref <0.031)

## 2015-08-26 MED ORDER — IPRATROPIUM BROMIDE 0.02 % IN SOLN
0.5000 mg | Freq: Once | RESPIRATORY_TRACT | Status: AC
  Administered 2015-08-26: 0.5 mg via RESPIRATORY_TRACT
  Filled 2015-08-26: qty 2.5

## 2015-08-26 MED ORDER — HYDROMORPHONE HCL 2 MG/ML IJ SOLN
2.0000 mg | Freq: Once | INTRAMUSCULAR | Status: AC
  Administered 2015-08-26: 2 mg via INTRAVENOUS
  Filled 2015-08-26: qty 1

## 2015-08-26 MED ORDER — HYDROMORPHONE HCL 2 MG/ML IJ SOLN
1.5000 mg | Freq: Once | INTRAMUSCULAR | Status: AC
  Administered 2015-08-26: 1.5 mg via INTRAVENOUS
  Filled 2015-08-26: qty 1

## 2015-08-26 MED ORDER — ALBUTEROL (5 MG/ML) CONTINUOUS INHALATION SOLN
10.0000 mg/h | INHALATION_SOLUTION | RESPIRATORY_TRACT | Status: DC
  Administered 2015-08-26: 10 mg/h via RESPIRATORY_TRACT
  Filled 2015-08-26: qty 20

## 2015-08-26 MED ORDER — HYDROMORPHONE HCL 1 MG/ML IJ SOLN
1.0000 mg | Freq: Once | INTRAMUSCULAR | Status: AC
  Administered 2015-08-26: 1 mg via INTRAVENOUS
  Filled 2015-08-26: qty 1

## 2015-08-26 MED ORDER — PREDNISONE 20 MG PO TABS: 40.0000 mg | ORAL_TABLET | Freq: Every day | ORAL | Status: DC

## 2015-08-26 NOTE — Discharge Instructions (Signed)

## 2015-08-26 NOTE — ED Notes (Signed)
Pt reports increase in SOB over the past 4 days. Pt reports having parts of both lungs removed due to asbestos. Pt also reports a new tumor on LT lung that needs surgery. Pt wears 3L nasal cannula. Pt also c/o LT sided chest tightness. Pt in NAD at this time.

## 2015-08-26 NOTE — ED Notes (Signed)
Pt requesting pain medication for right hip. States 2nd dose of Dilaudid helped but pain returning. Dr Wilson Singer notified

## 2015-08-26 NOTE — ED Notes (Signed)
Pt in xray. Xray will bring pt to room upon completion.

## 2015-09-01 NOTE — ED Provider Notes (Signed)
CSN: [id number]     Arrival date & time 08/26/15  1323 History   First MD Initiated Contact with Patient 08/26/15 1344     Chief Complaint  Patient presents with  . Shortness of Breath     (Consider location/radiation/quality/duration/timing/severity/associated sxs/prior Treatment) HPI   50 year old male with shortness of breath. Worsening over the past 4 days. Patient has a past history of COPD and also remote reports what sounds like pneumonectomy. Hurts previous occupational exposure to discuss this. He has baseline respiratory failure. Chronically wears 3 L of oxygen via nasal cannula. Occasional cough which is not simply change. Nonproductive. No fevers or chills. No unusual leg pain or swelling. He does have chronic leg and hip pain though and he is requesting medication for this.   Past Medical History  Diagnosis Date  . COPD (chronic obstructive pulmonary disease) ([place])   . Osteoporosis   . Neuropathy ([place])   . Back pain   . Depression   . Avascular necrosis of femur ([place])   . Pulmonary asbestosis [facility])    Past Surgical History  Procedure Laterality Date  . Joint replacement    . Back surgery    . Lung surgery     Family History  Problem Relation Age of Onset  . Heart failure Father   . Diabetes Father   . Diabetes Mother   . Cancer Mother     male cancer   Social History  Substance Use Topics  . Smoking status: Former Smoker -- 1.00 packs/day    Quit date: 07/05/2011  . Smokeless tobacco: None  . Alcohol Use: No    Review of Systems  All systems reviewed and negative, other than as noted in HPI.   Allergies  Ambien; Melatonin; and Zolpidem  Home Medications   Prior to Admission medications   Medication Sig Start Date End Date Taking? Authorizing Provider  ADVAIR DISKUS 250-50 MCG/DOSE AEPB Inhale 1 puff into the lungs 2 (two) times daily. 06/14/15  Yes Historical Provider, MD  albuterol (PROVENTIL) (2.5 MG/3ML) 0.083% nebulizer solution Take 3 mLs  (2.5 mg total) by nebulization every 6 (six) hours as needed for wheezing or shortness of breath. 02/27/14  Yes Kathie Dike, MD  budesonide-formoterol (SYMBICORT) 160-4.5 MCG/ACT inhaler Inhale 3 puffs into the lungs 4 (four) times daily.   Yes Historical Provider, MD  furosemide (LASIX) 40 MG tablet Take 40 mg by mouth daily.   Yes Historical Provider, MD  gabapentin (NEURONTIN) 300 MG capsule Take 600 mg by mouth 3 (three) times daily.    Yes Historical Provider, MD  morphine (MS CONTIN) 30 MG 12 hr tablet Take 30 mg by mouth every 8 (eight) hours. 08/04/15  Yes Historical Provider, MD  naloxone HCl (NARCAN) 4 MG/0.1ML LIQD Place 4 mg into the nose as directed. To prevent overdose   Yes Historical Provider, MD  ondansetron (ZOFRAN-ODT) 4 MG disintegrating tablet Take 4 mg by mouth every 8 (eight) hours as needed. nausea 02/08/15  Yes Historical Provider, MD  oxycodone (ROXICODONE) 30 MG immediate release tablet Take 30 mg by mouth every 4 (four) hours as needed for pain.   Yes Historical Provider, MD  tiotropium (SPIRIVA) 18 MCG inhalation capsule Place 1 capsule (18 mcg total) into inhaler and inhale daily. 07/24/11  Yes Nimish Luther Parody, MD  albuterol (PROVENTIL HFA;VENTOLIN HFA) 108 (90 BASE) MCG/ACT inhaler Inhale 2 puffs into the lungs every 4 (four) hours as needed for wheezing or shortness of breath. Shortness of breath 01/06/12  Delfina Redwood, MD  amoxicillin-clavulanate (AUGMENTIN) 875-125 MG tablet Take 1 tablet by mouth 2 (two) times daily. Patient not taking: Reported on 08/26/2015 04/19/15   Erline Hau, MD  benzonatate (TESSALON) 100 MG capsule Take 1 capsule (100 mg total) by mouth 3 (three) times daily as needed for cough. Patient not taking: Reported on 08/26/2015 04/19/15   Erline Hau, MD  dextromethorphan-guaiFENesin [facility] DM) 30-600 MG 12hr tablet Take 1 tablet by mouth 2 (two) times daily. Patient not taking: Reported on 08/26/2015 04/19/15    Erline Hau, MD  ondansetron (ZOFRAN ODT) 4 MG disintegrating tablet Take 1 tablet (4 mg total) by mouth every 8 (eight) hours as needed for nausea or vomiting. Patient not taking: Reported on 04/14/2015 05/24/14   Samuella Cota, MD  Polyethyl Glycol-Propyl Glycol (SYSTANE) 0.4-0.3 % SOLN Place 1-2 drops into the left eye daily as needed (for dry eye relief).     Historical Provider, MD  predniSONE (DELTASONE) 10 MG tablet Take 1 tablet (10 mg total) by mouth daily with breakfast. Take 6 tablets today and then decrease by 1 tablet daily until none are left. Patient not taking: Reported on 08/26/2015 04/19/15   Erline Hau, MD  predniSONE (DELTASONE) 20 MG tablet Take 2 tablets (40 mg total) by mouth daily. 08/26/15   Virgel Manifold, MD   BP 114/66 mmHg  Pulse 80  Temp(Src) 97.6 F (36.4 C) (Oral)  Resp 17  Ht 6\' 1"  (1.854 m)  Wt 176 lb (79.833 kg)  BMI 23.23 kg/m2  SpO2 98% Physical Exam  Constitutional: He appears well-developed and well-nourished. No distress.  HENT:  Head: Normocephalic and atraumatic.  Eyes: Conjunctivae are normal. Right eye exhibits no discharge. Left eye exhibits no discharge.  Neck: Neck supple.  Cardiovascular: Normal rate, regular rhythm and normal heart sounds.  Exam reveals no gallop and no friction rub.   No murmur heard. Pulmonary/Chest: Effort normal. No respiratory distress. He has wheezes.  Bilateral wheezing. Prolonged expiratory phase. No accessory muscle usage.  Abdominal: Soft. He exhibits no distension. There is no tenderness.  Musculoskeletal: He exhibits no edema or tenderness.  Neurological: He is alert.  Skin: Skin is warm and dry.  Psychiatric: He has a normal mood and affect. His behavior is normal. Thought content normal.  Nursing note and vitals reviewed.   ED Course  Procedures (including critical care time) Labs Review Labs Reviewed  COMPREHENSIVE METABOLIC PANEL - Abnormal; Notable for the following:     AST 14 (*)    All other components within normal limits  CBC WITH DIFFERENTIAL/PLATELET  TROPONIN I    Imaging Review No results found. I have personally reviewed and evaluated these images and lab results as part of my medical decision-making.   EKG Interpretation   Date/Time:  Thursday August 26 2015 13:29:30 EDT Ventricular Rate:  90 PR Interval:  156 QRS Duration: 98 QT Interval:  360 QTC Calculation: 440 R Axis:   90 Text Interpretation:  Normal sinus rhythm Right atrial enlargement  Rightward axis Pulmonary disease pattern Abnormal ECG Confirmed by Wilson Singer   MD, Raygan Skarda ([id number]) on 08/26/2015 2:18:18 PM      MDM   Final diagnoses:  COPD exacerbation ([place])    50 year old male what I think is primarily a COPD exacerbation. While in the emergency room patient began increasing complaining of hip pain which is chronic. Multiple requests for pain medicine. In terms of his respiratory status, at this point  I feel is appropriate for discharge and outpatient treatment. Return precautions were discussed.    Virgel Manifold, MD 09/01/15 [phone]

## 2015-09-09 ENCOUNTER — Ambulatory Visit: Payer: Self-pay | Admitting: Orthopedic Surgery

## 2015-09-15 ENCOUNTER — Encounter (HOSPITAL_COMMUNITY): Payer: Self-pay

## 2015-09-15 ENCOUNTER — Encounter (HOSPITAL_COMMUNITY)
Admission: RE | Admit: 2015-09-15 | Discharge: 2015-09-15 | Disposition: A | Discharge status: Home / Self Care | Payer: Medicare Other | Source: Ambulatory Visit | Attending: Orthopedic Surgery | Admitting: Orthopedic Surgery

## 2015-09-15 DIAGNOSIS — J449 Chronic obstructive pulmonary disease, unspecified: Secondary | ICD-10-CM | POA: Diagnosis not present

## 2015-09-15 DIAGNOSIS — M810 Age-related osteoporosis without current pathological fracture: Secondary | ICD-10-CM | POA: Insufficient documentation

## 2015-09-15 DIAGNOSIS — F17210 Nicotine dependence, cigarettes, uncomplicated: Secondary | ICD-10-CM | POA: Diagnosis not present

## 2015-09-15 DIAGNOSIS — Z01812 Encounter for preprocedural laboratory examination: Secondary | ICD-10-CM | POA: Diagnosis present

## 2015-09-15 DIAGNOSIS — D72829 Elevated white blood cell count, unspecified: Secondary | ICD-10-CM | POA: Insufficient documentation

## 2015-09-15 HISTORY — DX: Neoplasm of unspecified behavior of respiratory system: D49.1

## 2015-09-15 LAB — COMPREHENSIVE METABOLIC PANEL
ALK PHOS: 88 U/L (ref 38–126)
ALT: 16 U/L — AB (ref 17–63)
ANION GAP: 6 (ref 5–15)
AST: 16 U/L (ref 15–41)
Albumin: 4.1 g/dL (ref 3.5–5.0)
BUN: 14 mg/dL (ref 6–20)
CALCIUM: 8.6 mg/dL — AB (ref 8.9–10.3)
CO2: 27 mmol/L (ref 22–32)
CREATININE: 1.06 mg/dL (ref 0.61–1.24)
Chloride: 101 mmol/L (ref 101–111)
Glucose, Bld: 108 mg/dL — ABNORMAL HIGH (ref 65–99)
Potassium: 3.8 mmol/L (ref 3.5–5.1)
SODIUM: 134 mmol/L — AB (ref 135–145)
TOTAL PROTEIN: 7.5 g/dL (ref 6.5–8.1)
Total Bilirubin: 0.1 mg/dL — ABNORMAL LOW (ref 0.3–1.2)

## 2015-09-15 LAB — APTT: aPTT: 33 seconds (ref 24–37)

## 2015-09-15 LAB — CBC
HCT: 42.6 % (ref 39.0–52.0)
HEMOGLOBIN: 14.4 g/dL (ref 13.0–17.0)
MCH: 31.1 pg (ref 26.0–34.0)
MCHC: 33.8 g/dL (ref 30.0–36.0)
MCV: 92 fL (ref 78.0–100.0)
PLATELETS: 273 10*3/uL (ref 150–400)
RBC: 4.63 MIL/uL (ref 4.22–5.81)
RDW: 12.4 % (ref 11.5–15.5)
WBC: 9.1 10*3/uL (ref 4.0–10.5)

## 2015-09-15 LAB — URINALYSIS, ROUTINE W REFLEX MICROSCOPIC
Bilirubin Urine: NEGATIVE
GLUCOSE, UA: NEGATIVE mg/dL
HGB URINE DIPSTICK: NEGATIVE
Ketones, ur: NEGATIVE mg/dL
LEUKOCYTES UA: NEGATIVE
Nitrite: NEGATIVE
PH: 5.5 (ref 5.0–8.0)
PROTEIN: NEGATIVE mg/dL
SPECIFIC GRAVITY, URINE: 1.03 (ref 1.005–1.030)

## 2015-09-15 LAB — ABO/RH: ABO/RH(D): O POS

## 2015-09-15 LAB — SURGICAL PCR SCREEN
MRSA, PCR: NEGATIVE
STAPHYLOCOCCUS AUREUS: NEGATIVE

## 2015-09-15 LAB — PROTIME-INR
INR: 1 (ref 0.00–1.49)
PROTHROMBIN TIME: 13 s (ref 11.6–15.2)

## 2015-09-15 NOTE — Patient Instructions (Addendum)
Wesley [name]  09/15/2015   Your procedure is scheduled on: Friday 09/24/2015  Report to [facility]  Entrance take [place]  elevators to 3rd floor to  [place] at  1100 AM.  Call this number if you have problems the morning of surgery [phone]   Remember: ONLY 1 PERSON MAY GO WITH YOU TO SHORT STAY TO GET  READY MORNING OF [address].   Do not eat food :After Midnight.  MAY HAVE CLEAR LIQUIDS FROM MIDNIGHT UP UNTIL 0700 AM THEN NOTHING UNTIL AFTER SURGERY!     Take these medicines the morning of surgery with A SIP OF WATER: Patient takes MS CONTIN  At 5am, OXYCODONE takes it at 5 am, Gabapentin, Prednisone, Advair diskus, Albuterol inhaler if needed , Symbicort inhaler and Spiriva inhaler and bring inhalers with you to hospital                                 You may not have any metal on your body including hair pins and              piercings  Do not wear jewelry, make-up, lotions, powders or perfumes, deodorant             Do not wear nail polish.  Do not shave  48 hours prior to surgery.              Men may shave face and neck.   Do not bring valuables to the hospital. [place].  Contacts, dentures or bridgework may not be worn into surgery.  Leave suitcase in the car. After surgery it may be brought to your room.                  Please read over the following fact sheets you were given: _____________________________________________________________________             [facility] - Preparing for Surgery Before surgery, you can play an important role.  Because skin is not sterile, your skin needs to be as free of germs as possible.  You can reduce the number of germs on your skin by washing with CHG (chlorahexidine gluconate) soap before surgery.  CHG is an antiseptic cleaner which kills germs and bonds with the skin to continue killing germs even after washing. Please DO NOT use  if you have an allergy to CHG or antibacterial soaps.  If your skin becomes reddened/irritated stop using the CHG and inform your nurse when you arrive at Short Stay. Do not shave (including legs and underarms) for at least 48 hours prior to the first CHG shower.  You may shave your face/neck. Please follow these instructions carefully:  1.  Shower with CHG Soap the night before surgery and the  morning of Surgery.  2.  If you choose to wash your hair, wash your hair first as usual with your  normal  shampoo.  3.  After you shampoo, rinse your hair and body thoroughly to remove the  shampoo.                           4.  Use CHG as you would any other  liquid soap.  You can apply chg directly  to the skin and wash                       Gently with a scrungie or clean washcloth.  5.  Apply the CHG Soap to your body ONLY FROM THE NECK DOWN.   Do not use on face/ open                           Wound or open sores. Avoid contact with eyes, ears mouth and genitals (private parts).                       Wash face,  Genitals (private parts) with your normal soap.             6.  Wash thoroughly, paying special attention to the area where your surgery  will be performed.  7.  Thoroughly rinse your body with warm water from the neck down.  8.  DO NOT shower/wash with your normal soap after using and rinsing off  the CHG Soap.                9.  Pat yourself dry with a clean towel.            10.  Wear clean pajamas.            11.  Place clean sheets on your bed the night of your first shower and do not  sleep with pets. Day of Surgery : Do not apply any lotions/deodorants the morning of surgery.  Please wear clean clothes to the hospital/surgery center.  FAILURE TO FOLLOW THESE INSTRUCTIONS MAY RESULT IN THE CANCELLATION OF YOUR SURGERY PATIENT SIGNATURE_________________________________  NURSE  SIGNATURE__________________________________  ________________________________________________________________________   Adam Phenix  An incentive spirometer is a tool that can help keep your lungs clear and active. This tool measures how well you are filling your lungs with each breath. Taking long deep breaths may help reverse or decrease the chance of developing breathing (pulmonary) problems (especially infection) following:  A long period of time when you are unable to move or be active. BEFORE THE PROCEDURE   If the spirometer includes an indicator to show your best effort, your nurse or respiratory therapist will set it to a desired goal.  If possible, sit up straight or lean slightly forward. Try not to slouch.  Hold the incentive spirometer in an upright position. INSTRUCTIONS FOR USE   Sit on the edge of your bed if possible, or sit up as far as you can in bed or on a chair.  Hold the incentive spirometer in an upright position.  Breathe out normally.  Place the mouthpiece in your mouth and seal your lips tightly around it.  Breathe in slowly and as deeply as possible, raising the piston or the ball toward the top of the column.  Hold your breath for 3-5 seconds or for as long as possible. Allow the piston or ball to fall to the bottom of the column.  Remove the mouthpiece from your mouth and breathe out normally.  Rest for a few seconds and repeat Steps 1 through 7 at least 10 times every 1-2 hours when you are awake. Take your time and take a few normal breaths between deep breaths.  The spirometer may include an indicator to show your best effort. Use the indicator as a goal  to work toward during each repetition.  After each set of 10 deep breaths, practice coughing to be sure your lungs are clear. If you have an incision (the cut made at the time of surgery), support your incision when coughing by placing a pillow or rolled up towels firmly against it. Once  you are able to get out of bed, walk around indoors and cough well. You may stop using the incentive spirometer when instructed by your caregiver.  RISKS AND COMPLICATIONS  Take your time so you do not get dizzy or light-headed.  If you are in pain, you may need to take or ask for pain medication before doing incentive spirometry. It is harder to take a deep breath if you are having pain. AFTER USE  Rest and breathe slowly and easily.  It can be helpful to keep track of a log of your progress. Your caregiver can provide you with a simple table to help with this. If you are using the spirometer at home, follow these instructions: [place] IF:   You are having difficultly using the spirometer.  You have trouble using the spirometer as often as instructed.  Your pain medication is not giving enough relief while using the spirometer.  You develop fever of 100.5 F (38.1 C) or higher. SEEK IMMEDIATE MEDICAL CARE IF:   You cough up bloody sputum that had not been present before.  You develop fever of 102 F (38.9 C) or greater.  You develop worsening pain at or near the incision site. MAKE SURE YOU:   Understand these instructions.  Will watch your condition.  Will get help right away if you are not doing well or get worse. Document Released: 07/03/2006 Document Revised: 05/15/2011 Document Reviewed: 09/03/2006 ExitCare Patient Information 2014 ExitCare, [place].   ________________________________________________________________________  WHAT IS A BLOOD TRANSFUSION? Blood Transfusion Information  A transfusion is the replacement of blood or some of its parts. Blood is made up of multiple cells which provide different functions.  Red blood cells carry oxygen and are used for blood loss replacement.  White blood cells fight against infection.  Platelets control bleeding.  Plasma helps clot blood.  Other blood products are available for specialized needs, such as  hemophilia or other clotting disorders. BEFORE THE TRANSFUSION  Who gives blood for transfusions?   Healthy volunteers who are fully evaluated to make sure their blood is safe. This is blood bank blood. Transfusion therapy is the safest it has ever been in the practice of medicine. Before blood is taken from a donor, a complete history is taken to make sure that person has no history of diseases nor engages in risky social behavior (examples are intravenous drug use or sexual activity with multiple partners). The donor's travel history is screened to minimize risk of transmitting infections, such as malaria. The donated blood is tested for signs of infectious diseases, such as HIV and hepatitis. The blood is then tested to be sure it is compatible with you in order to minimize the chance of a transfusion reaction. If you or a relative donates blood, this is often done in anticipation of surgery and is not appropriate for emergency situations. It takes many days to process the donated blood. RISKS AND COMPLICATIONS Although transfusion therapy is very safe and saves many lives, the main dangers of transfusion include:   Getting an infectious disease.  Developing a transfusion reaction. This is an allergic reaction to something in the blood you were given. Every precaution  is taken to prevent this. The decision to have a blood transfusion has been considered carefully by your caregiver before blood is given. Blood is not given unless the benefits outweigh the risks. AFTER THE TRANSFUSION  Right after receiving a blood transfusion, you will usually feel much better and more energetic. This is especially true if your red blood cells have gotten low (anemic). The transfusion raises the level of the red blood cells which carry oxygen, and this usually causes an energy increase.  The nurse administering the transfusion will monitor you carefully for complications. HOME CARE INSTRUCTIONS  No special  instructions are needed after a transfusion. You may find your energy is better. Speak with your caregiver about any limitations on activity for underlying diseases you may have. SEEK MEDICAL CARE IF:   Your condition is not improving after your transfusion.  You develop redness or irritation at the intravenous (IV) site. SEEK IMMEDIATE MEDICAL CARE IF:  Any of the following symptoms occur over the next 12 hours:  Shaking chills.  You have a temperature by mouth above 102 F (38.9 C), not controlled by medicine.  Chest, back, or muscle pain.  People around you feel you are not acting correctly or are confused.  Shortness of breath or difficulty breathing.  Dizziness and fainting.  You get a rash or develop hives.  You have a decrease in urine output.  Your urine turns a dark color or changes to pink, red, or brown. Any of the following symptoms occur over the next 10 days:  You have a temperature by mouth above 102 F (38.9 C), not controlled by medicine.  Shortness of breath.  Weakness after normal activity.  The white part of the eye turns yellow (jaundice).  You have a decrease in the amount of urine or are urinating less often.  Your urine turns a dark color or changes to pink, red, or brown. Document Released: 02/18/2000 Document Revised: 05/15/2011 Document Reviewed: 10/07/2007 ExitCare Patient Information 2014 ExitCare, [place].  _______________________________________________________________________   CLEAR LIQUID DIET   Foods Allowed                                                                     Foods Excluded  Coffee and tea, regular and decaf                             liquids that you cannot  Plain Jell-O in any flavor                                             see through such as: Fruit ices (not with fruit pulp)                                     milk, soups, orange juice  Iced Popsicles  All solid  food Carbonated beverages, regular and diet                                    Cranberry, grape and apple juices Sports drinks like Gatorade Lightly seasoned clear broth or consume(fat free) Sugar, honey syrup  Sample Menu Breakfast                                Lunch                                     Supper Cranberry juice                    Beef broth                            Chicken broth Jell-O                                     Grape juice                           Apple juice Coffee or tea                        Jell-O                                      Popsicle                                                Coffee or tea                        Coffee or tea  _____________________________________________________________________

## 2015-09-23 ENCOUNTER — Ambulatory Visit: Payer: Self-pay | Admitting: Orthopedic Surgery

## 2015-09-23 NOTE — H&P (Signed)
Wesley [name] DOB: 1965-12-23 Married / Language: English / Race: White Male Date of Admission:  09/24/2015 CC:  Right Hip Pain History of Present Illness  The patient is a 50 year old male who comes in for a preoperative History and Physical. The patient is scheduled for a right anterior total hip arthroplasty to be performed by Dr. Dione Plover. Aluisio, MD at [facility] on September 15, 2015. The patient reports right hip problems including pain symptoms that have been present for year(s) (last evaluated 10 years ago and diagnosed with OA, history of left THA 11/2004 that is doing well). The symptoms began without any known injury. Symptoms reported include hip pain (constant, that is more severe with standing and walking), night pain and popping. The patient feels as if their symptoms are does feel they are worsening. With regrads to the left hip, he has a metal on metal construct which has done well. He states the left hip is doing well and no problems except for some weather change pain he will have during the cold and damp days. Otherwise, he is very pleased with the left hip. The right hip has progressed and worsened over time. It was recommended that he have the right hip replaced shortly after the left one approx. 10 years ago. He was not ready at that time and was not ablte to take any more time off of work. Unfortunately, the hip has gotten progressively worse with popping and grinding. It will aslo buckle on him causing him to fall at times. He will have signigficant pain in the hip and leg after sitting for any length of time and even describes pain down into the knee. It has been interferring with his mobility and he comes in today to discuss the need for surgery. It is of note that the patient has undergone some recent surgery on his lungs at [facility]. He is being followed by the [facility] and has undergone partial lower lobectomies due to asbestosis. He is now nocturnal oxygen  dependent and does have oxygen available during the day if needed. He has his own pulse oximeter so that he can follow his onw O2 levels. He states that he can go hours or even days in between the need of daytime O2. [facility] wanted to replace his right hip down there but he did not want anyone to do surgery except for Dr. Wynelle Link. They have been treated conservatively in the past for the above stated problem and despite conservative measures, they continue to have progressive pain and severe functional limitations and dysfunction. They have failed non-operative management including home exercise, medications. It is felt that they would benefit from undergoing total joint replacement. Risks and benefits of the procedure have been discussed with the patient and they elect to proceed with surgery. There are no active contraindications to surgery such as ongoing infection or rapidly progressive neurological disease.  Problem List/Past Medical Lumbago (M54.5) [09/23/2004]: Avascular necrosis of bone of right hip (M87.051)  Primary osteoarthritis of right hip (M16.11)  Asthma  Chronic Obstructive Lung Disease  Chronic Pain  Impaired Vision  Bronchitis  Pneumonia  COPD  Asbestosis  Sleep Apnea  Coronary Artery Disease/Heart Disease  Rheumatoid Arthritis  Degenerative Disc Disease  Measles  Allergies Ambien *HYPNOTICS/SEDATIVES/SLEEP DISORDER AGENTS*   Family History  Cancer  Mother, Sister. Cerebrovascular Accident  Brother, Father, Mother, Sister. Chronic Obstructive Lung Disease  Sister. Congestive Heart Failure  Father, Sister. Diabetes Mellitus  Father, Mother, Sister.  First Degree Relatives  reported Heart Disease  Brother, Father, Mother, Sister. Heart disease in male family member before age 67  Heart disease in male family member before age 53  Hypertension  Father, Mother. Kidney disease  Father, Mother. Liver Disease, Chronic  Father,  Mother. Rheumatoid Arthritis  Brother, Father, Mother, Sister.  Social History Children  2 Current work status  disabled Exercise  does running / walking Living situation  live with spouse Marital status  married No history of drug/alcohol rehab  Number of flights of stairs before winded  less than 1 Tobacco / smoke exposure  12/10/2014: yes Tobacco use  Former smoker. 12/10/2014 Under pain contract  Advance Directives  Living Will, Healthcare POA Daughter Burman Riis [place]) is Medical Power of Attorney Post-Surgical Plans  Home  Medication History  Advair Diskus (250-50MCG/DOSE Aero Pow Br Act, Inhalation) Active. Morphine Sulfate ER (30MG  Tablet ER, Oral) Active. OxyCODONE HCl (30MG  Tablet, Oral) Active. Ondansetron (4MG  Tablet Disperse, Oral) Active. ProAir HFA (108 (90 Base)MCG/ACT Aerosol Soln, Inhalation) Active. Spiriva HandiHaler (18MCG Capsule, Inhalation) Active. Gabapentin (300MG  Capsule, Oral) Active. Aspirin (81MG  Tablet, 1 (one) Oral) Active.  Past Surgical History Lung Surgery - Both  Partial Bilateral Lobectomy Total Knee Replacement - Left  Arthroscopic Knee Surgery - Both  Back Surgery  left Shoulder  left eye surgery   Review of Systems  General Not Present- Chills, Fatigue, Fever, Memory Loss, Night Sweats, Weight Gain and Weight Loss. Skin Not Present- Eczema, Hives, Itching, Lesions and Rash. HEENT Not Present- Dentures, Double Vision, Headache, Hearing Loss, Tinnitus and Visual Loss. Respiratory Present- Shortness of breath at rest, Shortness of breath with exertion, Snoring and Wheezing. Not Present- Allergies, Chronic Cough and Coughing up blood. Cardiovascular Not Present- Chest Pain, Difficulty Breathing Lying Down, Murmur, Palpitations, Racing/skipping heartbeats and Swelling. Gastrointestinal Not Present- Abdominal Pain, Bloody Stool, Constipation, Diarrhea, Difficulty Swallowing, Heartburn, Jaundice, Loss of appetitie,  Nausea and Vomiting. Male Genitourinary Present- Urinary frequency. Not Present- Blood in Urine, Discharge, Flank Pain, Incontinence, Painful Urination, Urgency, Urinary Retention, Urinating at Night and Weak urinary stream. Musculoskeletal Present- Back Pain, Joint Pain, Joint Swelling, Morning Stiffness, Muscle Pain, Muscle Weakness and Spasms. Neurological Not Present- Blackout spells, Difficulty with balance, Dizziness, Paralysis, Tremor and Weakness. Psychiatric Not Present- Insomnia.  Vitals  Weight: 178 lb Height: 73in Body Surface Area: 2.05 m Body Mass Index: 23.48 kg/m  Pulse: 88 (Regular)  BP: 102/60 (Sitting, Left Arm, Standard)   Physical Exam General Mental Status -Alert, cooperative and good historian. General Appearance-pleasant, Not in acute distress. Orientation-Oriented X3. Build & Nutrition-Well nourished and Well developed.  Head and Neck Head-normocephalic, atraumatic . Neck Global Assessment - supple, no bruit auscultated on the right, no bruit auscultated on the left.  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 Pupil - Right-Regular and Round. Motion - Bilateral-EOMI.  Chest and Lung Exam Auscultation Breath sounds - Decreased - Both Lung Fields. Adventitious sounds - Inspiratory & expiratory crackles - Both Lung Fields. Inspiratory wheeze - Both Lung Fields.  Cardiovascular Auscultation Rhythm - Regular rate and rhythm. Heart Sounds - S1 WNL and S2 WNL. Murmurs & Other Heart Sounds - Auscultation of the heart reveals - No Murmurs.  Abdomen Palpation/Percussion Tenderness - Abdomen is non-tender to palpation. Rigidity (guarding) - Abdomen is soft. Auscultation Auscultation of the abdomen reveals - Bowel sounds normal.  Male Genitourinary Note: Not done, not pertinent to present illness   Musculoskeletal Note: Well developed male, in no distress. His right hip can be flexed  about 90, minimal internal rotation, about  10 degrees of external rotation, 10 degrees of abduction. Left hip flexion 120, rotation in 30, out 40, abduction 40 without discomfort.  RADIOGRAPHS AP pelvis, AP and lateral of the hips show the prosthesis on the left in excellent position with no periprosthetic abnormalities. On the right, he has got severe avascular necrosis of the femoral head with a large area of collapse and now with bone on bone changes.  Assessment & Plan Primary osteoarthritis of right hip (M16.11)  Note:Surgical Plans: Right Total Hip Replacement - Anterior Approach  Disposition: Home  PCP: Dr. Blanch Media Dr. Judd Lien - [facility]  Topical TXA - CAD  Anesthesia Issues: Pulmonary Risks with the asbestosis and partial bialteral lobectomies. Patient states that he cannot do spinal injections. He uses 3 L Oxygen mostly at night (but patient states that he is supposed to use it all the time).  Signed electronically by Ok Edwards, III PA-C

## 2015-09-24 ENCOUNTER — Encounter (HOSPITAL_COMMUNITY): Payer: Self-pay | Admitting: *Deleted

## 2015-09-24 ENCOUNTER — Encounter (HOSPITAL_COMMUNITY)
Admission: RE | Disposition: A | Discharge status: Home Health | Payer: Self-pay | Source: Ambulatory Visit | Attending: Orthopedic Surgery

## 2015-09-24 ENCOUNTER — Inpatient Hospital Stay (HOSPITAL_COMMUNITY): Payer: Medicare Other

## 2015-09-24 ENCOUNTER — Inpatient Hospital Stay (HOSPITAL_COMMUNITY): Payer: Medicare Other | Admitting: Anesthesiology

## 2015-09-24 ENCOUNTER — Inpatient Hospital Stay (HOSPITAL_COMMUNITY)
Admission: RE | Admit: 2015-09-24 | Discharge: 2015-09-27 | DRG: 470 | Disposition: A | Discharge status: Home Health | Payer: Medicare Other | Source: Ambulatory Visit | Attending: Orthopedic Surgery | Admitting: Orthopedic Surgery

## 2015-09-24 DIAGNOSIS — Z8261 Family history of arthritis: Secondary | ICD-10-CM

## 2015-09-24 DIAGNOSIS — I2510 Atherosclerotic heart disease of native coronary artery without angina pectoris: Secondary | ICD-10-CM | POA: Diagnosis present

## 2015-09-24 DIAGNOSIS — D491 Neoplasm of unspecified behavior of respiratory system: Secondary | ICD-10-CM | POA: Diagnosis present

## 2015-09-24 DIAGNOSIS — M069 Rheumatoid arthritis, unspecified: Secondary | ICD-10-CM | POA: Diagnosis present

## 2015-09-24 DIAGNOSIS — Z87891 Personal history of nicotine dependence: Secondary | ICD-10-CM

## 2015-09-24 DIAGNOSIS — G8929 Other chronic pain: Secondary | ICD-10-CM | POA: Diagnosis present

## 2015-09-24 DIAGNOSIS — J449 Chronic obstructive pulmonary disease, unspecified: Secondary | ICD-10-CM | POA: Diagnosis present

## 2015-09-24 DIAGNOSIS — R059 Cough, unspecified: Secondary | ICD-10-CM

## 2015-09-24 DIAGNOSIS — D72829 Elevated white blood cell count, unspecified: Secondary | ICD-10-CM | POA: Diagnosis present

## 2015-09-24 DIAGNOSIS — R05 Cough: Secondary | ICD-10-CM

## 2015-09-24 DIAGNOSIS — G4730 Sleep apnea, unspecified: Secondary | ICD-10-CM | POA: Diagnosis present

## 2015-09-24 DIAGNOSIS — M169 Osteoarthritis of hip, unspecified: Secondary | ICD-10-CM | POA: Diagnosis present

## 2015-09-24 DIAGNOSIS — Z96642 Presence of left artificial hip joint: Secondary | ICD-10-CM | POA: Diagnosis present

## 2015-09-24 DIAGNOSIS — T380X5A Adverse effect of glucocorticoids and synthetic analogues, initial encounter: Secondary | ICD-10-CM | POA: Diagnosis not present

## 2015-09-24 DIAGNOSIS — Z96649 Presence of unspecified artificial hip joint: Secondary | ICD-10-CM

## 2015-09-24 DIAGNOSIS — J438 Other emphysema: Secondary | ICD-10-CM | POA: Diagnosis not present

## 2015-09-24 DIAGNOSIS — M87051 Idiopathic aseptic necrosis of right femur: Principal | ICD-10-CM | POA: Diagnosis present

## 2015-09-24 DIAGNOSIS — Z96652 Presence of left artificial knee joint: Secondary | ICD-10-CM | POA: Diagnosis present

## 2015-09-24 DIAGNOSIS — M25551 Pain in right hip: Secondary | ICD-10-CM | POA: Diagnosis present

## 2015-09-24 DIAGNOSIS — M810 Age-related osteoporosis without current pathological fracture: Secondary | ICD-10-CM | POA: Diagnosis present

## 2015-09-24 DIAGNOSIS — M25559 Pain in unspecified hip: Secondary | ICD-10-CM

## 2015-09-24 HISTORY — PX: TOTAL HIP ARTHROPLASTY: SHX124

## 2015-09-24 LAB — TYPE AND SCREEN
ABO/RH(D): O POS
Antibody Screen: NEGATIVE

## 2015-09-24 SURGERY — ARTHROPLASTY, HIP, TOTAL, ANTERIOR APPROACH
Anesthesia: Monitor Anesthesia Care | Site: Hip | Laterality: Right

## 2015-09-24 MED ORDER — PROMETHAZINE HCL 25 MG/ML IJ SOLN
6.2500 mg | INTRAMUSCULAR | Status: DC | PRN
Start: 2015-09-24 — End: 2015-09-24

## 2015-09-24 MED ORDER — RIVAROXABAN 10 MG PO TABS: 10.0000 mg | ORAL_TABLET | Freq: Every day | ORAL | Status: DC

## 2015-09-24 MED ORDER — LIDOCAINE 2% (20 MG/ML) 5 ML SYRINGE
INTRAMUSCULAR | Status: DC | PRN
  Administered 2015-09-24: 100 mg via INTRAVENOUS

## 2015-09-24 MED ORDER — METOCLOPRAMIDE HCL 5 MG/ML IJ SOLN: 5.0000 mg | Freq: Three times a day (TID) | INTRAMUSCULAR | Status: DC | PRN

## 2015-09-24 MED ORDER — NALOXONE HCL 4 MG/0.1ML NA LIQD: 4.0000 mg | NASAL | Status: DC

## 2015-09-24 MED ORDER — EPHEDRINE SULFATE 50 MG/ML IJ SOLN
INTRAMUSCULAR | Status: AC
  Filled 2015-09-24: qty 1

## 2015-09-24 MED ORDER — POLYETHYLENE GLYCOL 3350 17 G PO PACK: 17.0000 g | PACK | Freq: Every day | ORAL | Status: DC | PRN

## 2015-09-24 MED ORDER — SODIUM CHLORIDE 0.9 % IV SOLN
10.0000 mg | INTRAVENOUS | Status: DC | PRN
  Administered 2015-09-24: 50 ug/min via INTRAVENOUS

## 2015-09-24 MED ORDER — TIOTROPIUM BROMIDE MONOHYDRATE 18 MCG IN CAPS
18.0000 ug | ORAL_CAPSULE | Freq: Every day | RESPIRATORY_TRACT | Status: DC
  Administered 2015-09-25 – 2015-09-27 (×3): 18 ug via RESPIRATORY_TRACT
  Filled 2015-09-24: qty 5

## 2015-09-24 MED ORDER — BISACODYL 10 MG RE SUPP: 10.0000 mg | Freq: Every day | RECTAL | Status: DC | PRN

## 2015-09-24 MED ORDER — DOCUSATE SODIUM 100 MG PO CAPS
100.0000 mg | ORAL_CAPSULE | Freq: Two times a day (BID) | ORAL | Status: DC
  Administered 2015-09-24 – 2015-09-27 (×6): 100 mg via ORAL
  Filled 2015-09-24 (×6): qty 1

## 2015-09-24 MED ORDER — RIVAROXABAN 10 MG PO TABS
10.0000 mg | ORAL_TABLET | Freq: Every day | ORAL | Status: DC
  Administered 2015-09-25 – 2015-09-27 (×3): 10 mg via ORAL
  Filled 2015-09-24 (×3): qty 1

## 2015-09-24 MED ORDER — DEXAMETHASONE SODIUM PHOSPHATE 10 MG/ML IJ SOLN
INTRAMUSCULAR | Status: AC
  Filled 2015-09-24: qty 1

## 2015-09-24 MED ORDER — ALBUMIN HUMAN 5 % IV SOLN
12.5000 g | Freq: Once | INTRAVENOUS | Status: AC
  Administered 2015-09-24: 12.5 g via INTRAVENOUS

## 2015-09-24 MED ORDER — PREDNISONE 20 MG PO TABS
60.0000 mg | ORAL_TABLET | Freq: Two times a day (BID) | ORAL | Status: AC
  Administered 2015-09-25 (×2): 60 mg via ORAL
  Filled 2015-09-24 (×2): qty 3

## 2015-09-24 MED ORDER — CEFAZOLIN SODIUM-DEXTROSE 2-4 GM/100ML-% IV SOLN
2.0000 g | INTRAVENOUS | Status: AC
  Administered 2015-09-24: 2 g via INTRAVENOUS
  Filled 2015-09-24: qty 100

## 2015-09-24 MED ORDER — ACETAMINOPHEN 10 MG/ML IV SOLN
1000.0000 mg | Freq: Once | INTRAVENOUS | Status: AC
  Administered 2015-09-24: 1000 mg via INTRAVENOUS
  Filled 2015-09-24: qty 100

## 2015-09-24 MED ORDER — FENTANYL CITRATE (PF) 100 MCG/2ML IJ SOLN
INTRAMUSCULAR | Status: AC
  Filled 2015-09-24: qty 2

## 2015-09-24 MED ORDER — TRANEXAMIC ACID 1000 MG/10ML IV SOLN
2000.0000 mg | INTRAVENOUS | Status: DC | PRN
  Administered 2015-09-24: 2000 mg via TOPICAL

## 2015-09-24 MED ORDER — 0.9 % SODIUM CHLORIDE (POUR BTL) OPTIME
TOPICAL | Status: DC | PRN
  Administered 2015-09-24: 1000 mL

## 2015-09-24 MED ORDER — STERILE WATER FOR IRRIGATION IR SOLN
Status: DC | PRN
  Administered 2015-09-24: 3000 mL

## 2015-09-24 MED ORDER — PREDNISONE 20 MG PO TABS
60.0000 mg | ORAL_TABLET | Freq: Once | ORAL | Status: AC
  Administered 2015-09-24: 60 mg via ORAL
  Filled 2015-09-24: qty 3

## 2015-09-24 MED ORDER — GABAPENTIN 300 MG PO CAPS
600.0000 mg | ORAL_CAPSULE | Freq: Three times a day (TID) | ORAL | Status: DC
  Administered 2015-09-24 – 2015-09-27 (×9): 600 mg via ORAL
  Filled 2015-09-24 (×9): qty 2

## 2015-09-24 MED ORDER — SODIUM CHLORIDE 0.9 % IV SOLN
INTRAVENOUS | Status: DC
  Administered 2015-09-24: 17:00:00 via INTRAVENOUS

## 2015-09-24 MED ORDER — DEXAMETHASONE SODIUM PHOSPHATE 10 MG/ML IJ SOLN
10.0000 mg | Freq: Once | INTRAMUSCULAR | Status: AC
  Administered 2015-09-24: 10 mg via INTRAVENOUS

## 2015-09-24 MED ORDER — KETAMINE HCL 10 MG/ML IJ SOLN
INTRAMUSCULAR | Status: DC | PRN
  Administered 2015-09-24: 30 mg via INTRAVENOUS

## 2015-09-24 MED ORDER — MIDAZOLAM HCL 2 MG/2ML IJ SOLN
INTRAMUSCULAR | Status: AC
  Filled 2015-09-24: qty 2

## 2015-09-24 MED ORDER — CEFAZOLIN SODIUM-DEXTROSE 2-4 GM/100ML-% IV SOLN
INTRAVENOUS | Status: AC
  Filled 2015-09-24: qty 100

## 2015-09-24 MED ORDER — SODIUM CHLORIDE 0.9 % IV SOLN: 30.0000 ug/min | INTRAVENOUS | Status: DC

## 2015-09-24 MED ORDER — HYDROMORPHONE HCL 1 MG/ML IJ SOLN: 0.2500 mg | INTRAMUSCULAR | Status: DC | PRN

## 2015-09-24 MED ORDER — METHOCARBAMOL 500 MG PO TABS: 500.0000 mg | ORAL_TABLET | Freq: Four times a day (QID) | ORAL | Status: DC | PRN

## 2015-09-24 MED ORDER — ALBUTEROL SULFATE (2.5 MG/3ML) 0.083% IN NEBU
2.5000 mg | INHALATION_SOLUTION | Freq: Four times a day (QID) | RESPIRATORY_TRACT | Status: DC | PRN
  Filled 2015-09-24: qty 3

## 2015-09-24 MED ORDER — FUROSEMIDE 40 MG PO TABS
40.0000 mg | ORAL_TABLET | Freq: Every day | ORAL | Status: DC
  Administered 2015-09-25: 40 mg via ORAL
  Filled 2015-09-24 (×3): qty 1

## 2015-09-24 MED ORDER — METHOCARBAMOL 1000 MG/10ML IJ SOLN
500.0000 mg | Freq: Four times a day (QID) | INTRAVENOUS | Status: DC | PRN
  Filled 2015-09-24: qty 5

## 2015-09-24 MED ORDER — ACETAMINOPHEN 650 MG RE SUPP: 650.0000 mg | Freq: Four times a day (QID) | RECTAL | Status: DC | PRN

## 2015-09-24 MED ORDER — MOMETASONE FURO-FORMOTEROL FUM 200-5 MCG/ACT IN AERO
2.0000 | INHALATION_SPRAY | Freq: Two times a day (BID) | RESPIRATORY_TRACT | Status: DC
  Administered 2015-09-24 – 2015-09-27 (×6): 2 via RESPIRATORY_TRACT
  Filled 2015-09-24: qty 8.8

## 2015-09-24 MED ORDER — PROPOFOL 10 MG/ML IV BOLUS
INTRAVENOUS | Status: AC
  Filled 2015-09-24: qty 60

## 2015-09-24 MED ORDER — TRAMADOL HCL 50 MG PO TABS: 50.0000 mg | ORAL_TABLET | Freq: Four times a day (QID) | ORAL | Status: DC | PRN

## 2015-09-24 MED ORDER — MORPHINE SULFATE ER 30 MG PO TBCR
30.0000 mg | EXTENDED_RELEASE_TABLET | ORAL | Status: AC
  Administered 2015-09-24: 30 mg via ORAL
  Filled 2015-09-24: qty 1

## 2015-09-24 MED ORDER — HYDROCORTISONE NA SUCCINATE PF 100 MG IJ SOLR
INTRAMUSCULAR | Status: AC
Start: 2015-09-24 — End: 2015-09-24
  Filled 2015-09-24: qty 2

## 2015-09-24 MED ORDER — CEFAZOLIN SODIUM-DEXTROSE 2-4 GM/100ML-% IV SOLN
2.0000 g | Freq: Four times a day (QID) | INTRAVENOUS | Status: AC
  Administered 2015-09-24 – 2015-09-25 (×2): 2 g via INTRAVENOUS
  Filled 2015-09-24 (×2): qty 100

## 2015-09-24 MED ORDER — FENTANYL CITRATE (PF) 100 MCG/2ML IJ SOLN
INTRAMUSCULAR | Status: DC | PRN
  Administered 2015-09-24 (×2): 50 ug via INTRAVENOUS

## 2015-09-24 MED ORDER — MENTHOL 3 MG MT LOZG: 1.0000 | LOZENGE | OROMUCOSAL | Status: DC | PRN

## 2015-09-24 MED ORDER — HYDROCORTISONE NA SUCCINATE PF 100 MG IJ SOLR
INTRAMUSCULAR | Status: DC | PRN
  Administered 2015-09-24: 100 mg via INTRAVENOUS

## 2015-09-24 MED ORDER — OXYCODONE HCL 5 MG PO TABS
20.0000 mg | ORAL_TABLET | ORAL | Status: DC | PRN
  Administered 2015-09-24: 20 mg via ORAL
  Administered 2015-09-24: 40 mg via ORAL
  Administered 2015-09-24: 20 mg via ORAL
  Administered 2015-09-25 – 2015-09-27 (×19): 40 mg via ORAL
  Filled 2015-09-24 (×3): qty 8
  Filled 2015-09-24: qty 4
  Filled 2015-09-24 (×7): qty 8
  Filled 2015-09-24: qty 4
  Filled 2015-09-24 (×11): qty 8

## 2015-09-24 MED ORDER — FENTANYL CITRATE (PF) 100 MCG/2ML IJ SOLN
INTRAMUSCULAR | Status: AC
Start: 2015-09-24 — End: 2015-09-24
  Filled 2015-09-24: qty 2

## 2015-09-24 MED ORDER — ACETAMINOPHEN 500 MG PO TABS
1000.0000 mg | ORAL_TABLET | Freq: Four times a day (QID) | ORAL | Status: AC
  Administered 2015-09-24 – 2015-09-25 (×4): 1000 mg via ORAL
  Filled 2015-09-24 (×4): qty 2

## 2015-09-24 MED ORDER — ALBUMIN HUMAN 5 % IV SOLN
INTRAVENOUS | Status: AC
  Filled 2015-09-24: qty 250

## 2015-09-24 MED ORDER — ONDANSETRON HCL 4 MG/2ML IJ SOLN: 4.0000 mg | Freq: Four times a day (QID) | INTRAMUSCULAR | Status: DC | PRN

## 2015-09-24 MED ORDER — METHOCARBAMOL 500 MG PO TABS
500.0000 mg | ORAL_TABLET | Freq: Four times a day (QID) | ORAL | Status: DC | PRN
  Administered 2015-09-24 – 2015-09-27 (×6): 500 mg via ORAL
  Filled 2015-09-24 (×7): qty 1

## 2015-09-24 MED ORDER — METOCLOPRAMIDE HCL 5 MG PO TABS: 5.0000 mg | ORAL_TABLET | Freq: Three times a day (TID) | ORAL | Status: DC | PRN

## 2015-09-24 MED ORDER — LACTATED RINGERS IV SOLN
INTRAVENOUS | Status: DC
  Administered 2015-09-24: 13:00:00 via INTRAVENOUS

## 2015-09-24 MED ORDER — DIPHENHYDRAMINE HCL 12.5 MG/5ML PO ELIX: 12.5000 mg | ORAL_SOLUTION | ORAL | Status: DC | PRN

## 2015-09-24 MED ORDER — PREDNISONE 20 MG PO TABS: 40.0000 mg | ORAL_TABLET | Freq: Every day | ORAL | Status: DC

## 2015-09-24 MED ORDER — BUPIVACAINE HCL (PF) 0.25 % IJ SOLN
INTRAMUSCULAR | Status: DC | PRN
  Administered 2015-09-24: 30 mL

## 2015-09-24 MED ORDER — ACETAMINOPHEN 10 MG/ML IV SOLN
INTRAVENOUS | Status: AC
Start: 2015-09-24 — End: 2015-09-24
  Filled 2015-09-24: qty 100

## 2015-09-24 MED ORDER — PROPOFOL 500 MG/50ML IV EMUL
INTRAVENOUS | Status: DC | PRN
  Administered 2015-09-24: 140 ug/kg/min via INTRAVENOUS

## 2015-09-24 MED ORDER — PHENYLEPHRINE HCL 10 MG/ML IJ SOLN
INTRAMUSCULAR | Status: DC | PRN
  Administered 2015-09-24 (×2): 80 ug via INTRAVENOUS

## 2015-09-24 MED ORDER — ALBUTEROL SULFATE (2.5 MG/3ML) 0.083% IN NEBU
3.0000 mL | INHALATION_SOLUTION | RESPIRATORY_TRACT | Status: DC | PRN
  Administered 2015-09-26 (×3): 3 mL via RESPIRATORY_TRACT
  Filled 2015-09-24 (×2): qty 3

## 2015-09-24 MED ORDER — TRAMADOL HCL 50 MG PO TABS
50.0000 mg | ORAL_TABLET | Freq: Four times a day (QID) | ORAL | Status: DC | PRN
  Administered 2015-09-24: 100 mg via ORAL
  Filled 2015-09-24: qty 2

## 2015-09-24 MED ORDER — OXYCODONE HCL 20 MG PO TABS: 20.0000 mg | ORAL_TABLET | ORAL | Status: DC | PRN

## 2015-09-24 MED ORDER — CHLORHEXIDINE GLUCONATE 4 % EX LIQD: 60.0000 mL | Freq: Once | CUTANEOUS | Status: DC

## 2015-09-24 MED ORDER — BUPIVACAINE IN DEXTROSE 0.75-8.25 % IT SOLN
INTRATHECAL | Status: DC | PRN
  Administered 2015-09-24: 15 mg via INTRATHECAL

## 2015-09-24 MED ORDER — PHENYLEPHRINE HCL 10 MG/ML IJ SOLN
INTRAMUSCULAR | Status: AC
  Filled 2015-09-24: qty 1

## 2015-09-24 MED ORDER — MORPHINE SULFATE ER 30 MG PO TBCR
30.0000 mg | EXTENDED_RELEASE_TABLET | Freq: Three times a day (TID) | ORAL | Status: DC
  Administered 2015-09-24 – 2015-09-27 (×8): 30 mg via ORAL
  Filled 2015-09-24 (×9): qty 1

## 2015-09-24 MED ORDER — PREDNISONE 20 MG PO TABS
40.0000 mg | ORAL_TABLET | Freq: Two times a day (BID) | ORAL | Status: DC
  Administered 2015-09-26: 40 mg via ORAL
  Filled 2015-09-24: qty 2

## 2015-09-24 MED ORDER — ONDANSETRON HCL 4 MG PO TABS: 4.0000 mg | ORAL_TABLET | Freq: Four times a day (QID) | ORAL | Status: DC | PRN

## 2015-09-24 MED ORDER — TRANEXAMIC ACID 1000 MG/10ML IV SOLN
2000.0000 mg | Freq: Once | INTRAVENOUS | Status: DC
  Filled 2015-09-24: qty 20

## 2015-09-24 MED ORDER — KETAMINE HCL 10 MG/ML IJ SOLN
INTRAMUSCULAR | Status: AC
  Filled 2015-09-24: qty 1

## 2015-09-24 MED ORDER — MORPHINE SULFATE (PF) 2 MG/ML IV SOLN
1.0000 mg | INTRAVENOUS | Status: DC | PRN
  Administered 2015-09-24 – 2015-09-25 (×7): 1 mg via INTRAVENOUS
  Filled 2015-09-24 (×7): qty 1

## 2015-09-24 MED ORDER — FLEET ENEMA 7-19 GM/118ML RE ENEM: 1.0000 | ENEMA | Freq: Once | RECTAL | Status: DC | PRN

## 2015-09-24 MED ORDER — ACETAMINOPHEN 325 MG PO TABS: 650.0000 mg | ORAL_TABLET | Freq: Four times a day (QID) | ORAL | Status: DC | PRN

## 2015-09-24 MED ORDER — MIDAZOLAM HCL 5 MG/5ML IJ SOLN
INTRAMUSCULAR | Status: DC | PRN
  Administered 2015-09-24: 2 mg via INTRAVENOUS

## 2015-09-24 MED ORDER — BUPIVACAINE HCL (PF) 0.25 % IJ SOLN
INTRAMUSCULAR | Status: AC
  Filled 2015-09-24: qty 30

## 2015-09-24 MED ORDER — ONDANSETRON HCL 4 MG/2ML IJ SOLN
INTRAMUSCULAR | Status: AC
  Filled 2015-09-24: qty 2

## 2015-09-24 MED ORDER — ONDANSETRON HCL 4 MG/2ML IJ SOLN
INTRAMUSCULAR | Status: DC | PRN
  Administered 2015-09-24: 4 mg via INTRAVENOUS

## 2015-09-24 MED ORDER — PHENOL 1.4 % MT LIQD
1.0000 | OROMUCOSAL | Status: DC | PRN
Start: 2015-09-24 — End: 2015-09-27

## 2015-09-24 SURGICAL SUPPLY — 35 items
BAG DECANTER FOR FLEXI CONT (MISCELLANEOUS) ×2 IMPLANT
BAG SPEC THK2 15X12 ZIP CLS (MISCELLANEOUS)
BAG ZIPLOCK 12X15 (MISCELLANEOUS) IMPLANT
BLADE SAG 18X100X1.27 (BLADE) ×2 IMPLANT
CAPT HIP TOTAL 2 ×1 IMPLANT
CLOTH BEACON ORANGE TIMEOUT ST (SAFETY) ×2 IMPLANT
COVER PERINEAL POST (MISCELLANEOUS) ×2 IMPLANT
DECANTER SPIKE VIAL GLASS SM (MISCELLANEOUS) ×2 IMPLANT
DRAPE STERI IOBAN 125X83 (DRAPES) ×2 IMPLANT
DRAPE U-SHAPE 47X51 STRL (DRAPES) ×4 IMPLANT
DRSG ADAPTIC 3X8 NADH LF (GAUZE/BANDAGES/DRESSINGS) ×2 IMPLANT
DRSG MEPILEX BORDER 4X4 (GAUZE/BANDAGES/DRESSINGS) ×2 IMPLANT
DRSG MEPILEX BORDER 4X8 (GAUZE/BANDAGES/DRESSINGS) ×2 IMPLANT
DURAPREP 26ML APPLICATOR (WOUND CARE) ×2 IMPLANT
ELECT REM PT RETURN 9FT ADLT (ELECTROSURGICAL) ×2
ELECTRODE REM PT RTRN 9FT ADLT (ELECTROSURGICAL) ×1 IMPLANT
EVACUATOR 1/8 PVC DRAIN (DRAIN) ×2 IMPLANT
GLOVE BIO SURGEON STRL SZ7.5 (GLOVE) ×2 IMPLANT
GLOVE BIO SURGEON STRL SZ8 (GLOVE) ×4 IMPLANT
GLOVE BIOGEL PI IND STRL 8 (GLOVE) ×2 IMPLANT
GLOVE BIOGEL PI INDICATOR 8 (GLOVE) ×2
GOWN SPEC L3 XXLG W/TWL (GOWN DISPOSABLE) ×1 IMPLANT
GOWN STRL REUS W/TWL LRG LVL3 (GOWN DISPOSABLE) ×4 IMPLANT
GOWN STRL REUS W/TWL XL LVL3 (GOWN DISPOSABLE) ×2 IMPLANT
PACK ANTERIOR HIP CUSTOM (KITS) ×2 IMPLANT
STRIP CLOSURE SKIN 1/2X4 (GAUZE/BANDAGES/DRESSINGS) ×2 IMPLANT
SUT ETHIBOND NAB CT1 #1 30IN (SUTURE) ×2 IMPLANT
SUT MNCRL AB 4-0 PS2 18 (SUTURE) ×2 IMPLANT
SUT VIC AB 2-0 CT1 27 (SUTURE) ×4
SUT VIC AB 2-0 CT1 TAPERPNT 27 (SUTURE) ×2 IMPLANT
SUT VLOC 180 0 24IN GS25 (SUTURE) ×2 IMPLANT
SYR 50ML LL SCALE MARK (SYRINGE) IMPLANT
TRAY FOLEY W/METER SILVER 14FR (SET/KITS/TRAYS/PACK) ×1 IMPLANT
TRAY FOLEY W/METER SILVER 16FR (SET/KITS/TRAYS/PACK) ×2 IMPLANT
YANKAUER SUCT BULB TIP 10FT TU (MISCELLANEOUS) ×2 IMPLANT

## 2015-09-24 NOTE — Transfer of Care (Signed)
Immediate Anesthesia Transfer of Care Note  Patient: Wesley [name]  Procedure(s) Performed: Procedure(s): TOTAL HIP ARTHROPLASTY ANTERIOR APPROACH (Right)  Patient Location: PACU  Anesthesia Type:Spinal  Level of Consciousness:  sedated, patient cooperative and responds to stimulation  Airway & Oxygen Therapy:Patient Spontanous Breathing and Patient connected to face mask oxgen  Post-op Assessment:  Report given to PACU RN and Post -op Vital signs reviewed and stable  Post vital signs:  Reviewed and stable  Last Vitals:  Filed Vitals:   09/24/15 1101  BP: 93/46  Pulse: 101  Temp: 36.9 C  Resp: 20    Complications: No apparent anesthesia complications

## 2015-09-24 NOTE — Interval H&P Note (Signed)
History and Physical Interval Note:  09/24/2015 12:25 PM  Wesley [name]  has presented today for surgery, with the diagnosis of OA RIGHT HIP  The various methods of treatment have been discussed with the patient and family. After consideration of risks, benefits and other options for treatment, the patient has consented to  Procedure(s): TOTAL HIP ARTHROPLASTY ANTERIOR APPROACH (Right) as a surgical intervention .  The patient's history has been reviewed, patient examined, no change in status, stable for surgery.  I have reviewed the patient's chart and labs.  Questions were answered to the patient's satisfaction.     Gearlean Alf

## 2015-09-24 NOTE — Anesthesia Postprocedure Evaluation (Signed)
Anesthesia Post Note  Patient: AYSON BLAHNIK  Procedure(s) Performed: Procedure(s) (LRB): TOTAL HIP ARTHROPLASTY ANTERIOR APPROACH (Right)  Patient location during evaluation: PACU Anesthesia Type: Spinal and MAC Level of consciousness: awake and alert Pain management: pain level controlled Vital Signs Assessment: post-procedure vital signs reviewed and stable Respiratory status: spontaneous breathing and respiratory function stable Cardiovascular status: blood pressure returned to baseline and stable Postop Assessment: spinal receding Anesthetic complications: no    Last Vitals:  Filed Vitals:   09/24/15 1545 09/24/15 1600  BP: 97/56 96/49  Pulse: 77 80  Temp:    Resp: 18 18    Last Pain:  Filed Vitals:   09/24/15 1604  PainSc: Asleep    LLE Motor Response: Purposeful movement (09/24/15 1600)   RLE Motor Response: Purposeful movement (09/24/15 1600)   L Sensory Level: L1-Inguinal (groin) region (09/24/15 1600) R Sensory Level: L1-Inguinal (groin) region (09/24/15 1600)  Loren Vicens DANIEL

## 2015-09-24 NOTE — H&P (View-Only) (Signed)
Wesley [name] DOB: 1965-12-23 Married / Language: English / Race: White Male Date of Admission:  09/24/2015 CC:  Right Hip Pain History of Present Illness  The patient is a 50 year old male who comes in for a preoperative History and Physical. The patient is scheduled for a right anterior total hip arthroplasty to be performed by Dr. Dione Plover. Aluisio, MD at [facility] on September 15, 2015. The patient reports right hip problems including pain symptoms that have been present for year(s) (last evaluated 10 years ago and diagnosed with OA, history of left THA 11/2004 that is doing well). The symptoms began without any known injury. Symptoms reported include hip pain (constant, that is more severe with standing and walking), night pain and popping. The patient feels as if their symptoms are does feel they are worsening. With regrads to the left hip, he has a metal on metal construct which has done well. He states the left hip is doing well and no problems except for some weather change pain he will have during the cold and damp days. Otherwise, he is very pleased with the left hip. The right hip has progressed and worsened over time. It was recommended that he have the right hip replaced shortly after the left one approx. 10 years ago. He was not ready at that time and was not ablte to take any more time off of work. Unfortunately, the hip has gotten progressively worse with popping and grinding. It will aslo buckle on him causing him to fall at times. He will have signigficant pain in the hip and leg after sitting for any length of time and even describes pain down into the knee. It has been interferring with his mobility and he comes in today to discuss the need for surgery. It is of note that the patient has undergone some recent surgery on his lungs at [facility]. He is being followed by the [facility] and has undergone partial lower lobectomies due to asbestosis. He is now nocturnal oxygen  dependent and does have oxygen available during the day if needed. He has his own pulse oximeter so that he can follow his onw O2 levels. He states that he can go hours or even days in between the need of daytime O2. [facility] wanted to replace his right hip down there but he did not want anyone to do surgery except for Dr. Wynelle Link. They have been treated conservatively in the past for the above stated problem and despite conservative measures, they continue to have progressive pain and severe functional limitations and dysfunction. They have failed non-operative management including home exercise, medications. It is felt that they would benefit from undergoing total joint replacement. Risks and benefits of the procedure have been discussed with the patient and they elect to proceed with surgery. There are no active contraindications to surgery such as ongoing infection or rapidly progressive neurological disease.  Problem List/Past Medical Lumbago (M54.5) [09/23/2004]: Avascular necrosis of bone of right hip (M87.051)  Primary osteoarthritis of right hip (M16.11)  Asthma  Chronic Obstructive Lung Disease  Chronic Pain  Impaired Vision  Bronchitis  Pneumonia  COPD  Asbestosis  Sleep Apnea  Coronary Artery Disease/Heart Disease  Rheumatoid Arthritis  Degenerative Disc Disease  Measles  Allergies Ambien *HYPNOTICS/SEDATIVES/SLEEP DISORDER AGENTS*   Family History  Cancer  Mother, Sister. Cerebrovascular Accident  Brother, Father, Mother, Sister. Chronic Obstructive Lung Disease  Sister. Congestive Heart Failure  Father, Sister. Diabetes Mellitus  Father, Mother, Sister.  First Degree Relatives  reported Heart Disease  Brother, Father, Mother, Sister. Heart disease in male family member before age 58  Heart disease in male family member before age 29  Hypertension  Father, Mother. Kidney disease  Father, Mother. Liver Disease, Chronic  Father,  Mother. Rheumatoid Arthritis  Brother, Father, Mother, Sister.  Social History Children  2 Current work status  disabled Exercise  does running / walking Living situation  live with spouse Marital status  married No history of drug/alcohol rehab  Number of flights of stairs before winded  less than 1 Tobacco / smoke exposure  12/10/2014: yes Tobacco use  Former smoker. 12/10/2014 Under pain contract  Advance Directives  Living Will, Healthcare POA Daughter Burman Riis [place]) is Medical Power of Attorney Post-Surgical Plans  Home  Medication History  Advair Diskus (250-50MCG/DOSE Aero Pow Br Act, Inhalation) Active. Morphine Sulfate ER (30MG  Tablet ER, Oral) Active. OxyCODONE HCl (30MG  Tablet, Oral) Active. Ondansetron (4MG  Tablet Disperse, Oral) Active. ProAir HFA (108 (90 Base)MCG/ACT Aerosol Soln, Inhalation) Active. Spiriva HandiHaler (18MCG Capsule, Inhalation) Active. Gabapentin (300MG  Capsule, Oral) Active. Aspirin (81MG  Tablet, 1 (one) Oral) Active.  Past Surgical History Lung Surgery - Both  Partial Bilateral Lobectomy Total Knee Replacement - Left  Arthroscopic Knee Surgery - Both  Back Surgery  left Shoulder  left eye surgery   Review of Systems  General Not Present- Chills, Fatigue, Fever, Memory Loss, Night Sweats, Weight Gain and Weight Loss. Skin Not Present- Eczema, Hives, Itching, Lesions and Rash. HEENT Not Present- Dentures, Double Vision, Headache, Hearing Loss, Tinnitus and Visual Loss. Respiratory Present- Shortness of breath at rest, Shortness of breath with exertion, Snoring and Wheezing. Not Present- Allergies, Chronic Cough and Coughing up blood. Cardiovascular Not Present- Chest Pain, Difficulty Breathing Lying Down, Murmur, Palpitations, Racing/skipping heartbeats and Swelling. Gastrointestinal Not Present- Abdominal Pain, Bloody Stool, Constipation, Diarrhea, Difficulty Swallowing, Heartburn, Jaundice, Loss of appetitie,  Nausea and Vomiting. Male Genitourinary Present- Urinary frequency. Not Present- Blood in Urine, Discharge, Flank Pain, Incontinence, Painful Urination, Urgency, Urinary Retention, Urinating at Night and Weak urinary stream. Musculoskeletal Present- Back Pain, Joint Pain, Joint Swelling, Morning Stiffness, Muscle Pain, Muscle Weakness and Spasms. Neurological Not Present- Blackout spells, Difficulty with balance, Dizziness, Paralysis, Tremor and Weakness. Psychiatric Not Present- Insomnia.  Vitals  Weight: 178 lb Height: 73in Body Surface Area: 2.05 m Body Mass Index: 23.48 kg/m  Pulse: 88 (Regular)  BP: 102/60 (Sitting, Left Arm, Standard)   Physical Exam General Mental Status -Alert, cooperative and good historian. General Appearance-pleasant, Not in acute distress. Orientation-Oriented X3. Build & Nutrition-Well nourished and Well developed.  Head and Neck Head-normocephalic, atraumatic . Neck Global Assessment - supple, no bruit auscultated on the right, no bruit auscultated on the left.  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 Pupil - Right-Regular and Round. Motion - Bilateral-EOMI.  Chest and Lung Exam Auscultation Breath sounds - Decreased - Both Lung Fields. Adventitious sounds - Inspiratory & expiratory crackles - Both Lung Fields. Inspiratory wheeze - Both Lung Fields.  Cardiovascular Auscultation Rhythm - Regular rate and rhythm. Heart Sounds - S1 WNL and S2 WNL. Murmurs & Other Heart Sounds - Auscultation of the heart reveals - No Murmurs.  Abdomen Palpation/Percussion Tenderness - Abdomen is non-tender to palpation. Rigidity (guarding) - Abdomen is soft. Auscultation Auscultation of the abdomen reveals - Bowel sounds normal.  Male Genitourinary Note: Not done, not pertinent to present illness   Musculoskeletal Note: Well developed male, in no distress. His right hip can be flexed  about 90, minimal internal rotation, about  10 degrees of external rotation, 10 degrees of abduction. Left hip flexion 120, rotation in 30, out 40, abduction 40 without discomfort.  RADIOGRAPHS AP pelvis, AP and lateral of the hips show the prosthesis on the left in excellent position with no periprosthetic abnormalities. On the right, he has got severe avascular necrosis of the femoral head with a large area of collapse and now with bone on bone changes.  Assessment & Plan Primary osteoarthritis of right hip (M16.11)  Note:Surgical Plans: Right Total Hip Replacement - Anterior Approach  Disposition: Home  PCP: Dr. Blanch Media Dr. Judd Lien - [facility]  Topical TXA - CAD  Anesthesia Issues: Pulmonary Risks with the asbestosis and partial bialteral lobectomies. Patient states that he cannot do spinal injections. He uses 3 L Oxygen mostly at night (but patient states that he is supposed to use it all the time).  Signed electronically by Ok Edwards, III PA-C

## 2015-09-24 NOTE — Op Note (Signed)
OPERATIVE REPORT  PREOPERATIVE DIAGNOSIS: Avascular necrosis of the Right hip.   POSTOPERATIVE DIAGNOSIS: Avascular necrosis of the Right  hip.   PROCEDURE: Right total hip arthroplasty, anterior approach.   SURGEON: Gaynelle Arabian, MD   ASSISTANT: Ardeen Jourdain, PA-C  ANESTHESIA:  Spinal  ESTIMATED BLOOD LOSS:-250 ml    DRAINS: Hemovac x1.   COMPLICATIONS: None   CONDITION: PACU - hemodynamically stable.   BRIEF CLINICAL NOTE: Wesley [name] is a 50 y.o. male who has advanced end-  stage osteonecrosis of his Right  hip with progressively worsening pain and  dysfunction.The patient has failed nonoperative management and presents for  total hip arthroplasty.   PROCEDURE IN DETAIL: After successful administration of spinal  anesthetic, the traction boots for the [facility] bed were placed on both  feet and the patient was placed onto the [facility] bed, boots placed into the leg  holders. The Right hip was then isolated from the perineum with plastic  drapes and prepped and draped in the usual sterile fashion. ASIS and  greater trochanter were marked and a oblique incision was made, starting  at about 1 cm lateral and 2 cm distal to the ASIS and coursing towards  the anterior cortex of the femur. The skin was cut with a 10 blade  through subcutaneous tissue to the level of the fascia overlying the  tensor fascia lata muscle. The fascia was then incised in line with the  incision at the junction of the anterior third and posterior 2/3rd. The  muscle was teased off the fascia and then the interval between the TFL  and the rectus was developed. The Hohmann retractor was then placed at  the top of the femoral neck over the capsule. The vessels overlying the  capsule were cauterized and the fat on top of the capsule was removed.  A Hohmann retractor was then placed anterior underneath the rectus  femoris to give exposure to the entire anterior capsule. A T-shaped  capsulotomy  was performed. The edges were tagged and the femoral head  was identified.       Osteophytes are removed off the superior acetabulum.  The femoral neck was then cut in situ with an oscillating saw. Traction  was then applied to the left lower extremity utilizing the [facility]  traction. The femoral head was then removed. Retractors were placed  around the acetabulum and then circumferential removal of the labrum was  performed. Osteophytes were also removed. Reaming starts at 47 mm to  medialize and  Increased in 2 mm increments to 55 mm. We reamed in  approximately 40 degrees of abduction, 20 degrees anteversion. A 56 mm  pinnacle acetabular shell was then impacted in anatomic position under  fluoroscopic guidance with excellent purchase. We did not need to place  any additional dome screws. A 36 mm neutral + 4 marathon liner was then  placed into the acetabular shell.       The femoral lift was then placed along the lateral aspect of the femur  just distal to the vastus ridge. The leg was  externally rotated and capsule  was stripped off the inferior aspect of the femoral neck down to the  level of the lesser trochanter, this was done with electrocautery. The femur was lifted after this was performed. The  leg was then placed and extended in adducted position to essentially delivering the femur. We also removed the capsule superiorly and the  piriformis  from the piriformis fossa to gain excellent exposure of the  proximal femur. Rongeur was used to remove some cancellous bone to get  into the lateral portion of the proximal femur for placement of the  initial starter reamer. The starter broaches was placed  the starter broach  and was shown to go down the center of the canal. Broaching  with the  Corail system was then performed starting at size 8, coursing  Up to size 16. A size 16 had excellent torsional and rotational  and axial stability. The trial high offset neck was then placed  with a  36 + 5 trial head. The hip was then reduced. We confirmed that  the stem was in the canal both on AP and lateral x-rays. It also has excellent sizing. The hip was reduced with outstanding stability through full extension, full external rotation,  and then flexion in adduction internal rotation. AP pelvis was taken  and the leg lengths were measured and found to be exactly equal. Hip  was then dislocated again and the femoral head and neck removed. The  femoral broach was removed. Size 16 Corail stem with a high offset  neck was then impacted into the femur following native anteversion. Has  excellent purchase in the canal. Excellent torsional and rotational and  axial stability. It is confirmed to be in the canal on AP and lateral  fluoroscopic views. The 36 + 5 ceramic head was placed and the hip  reduced with outstanding stability. Again AP pelvis was taken and it  confirmed that the leg lengths were equal. The wound was then copiously  irrigated with saline solution and the capsule reattached and repaired  with Ethibond suture. 30 ml of .25% Bupivicaine injected into the capsule and into the edge of the tensor fascia lata as well as subcutaneous tissue. The fascia overlying the tensor fascia lata was  then closed with a running #1 V-Loc. Subcu was closed with interrupted  2-0 Vicryl and subcuticular running 4-0 Monocryl. Incision was cleaned  and dried. Steri-Strips and a bulky sterile dressing applied. Hemovac  drain was hooked to suction and then he was awakened and transported to  recovery in stable condition.        Please note that a surgical assistant was a medical necessity for this procedure to perform it in a safe and expeditious manner. Assistant was necessary to provide appropriate retraction of vital neurovascular structures and to prevent femoral fracture and allow for anatomic placement of the prosthesis.  Gaynelle Arabian, M.D.

## 2015-09-24 NOTE — Anesthesia Preprocedure Evaluation (Addendum)
Anesthesia Evaluation  Patient identified by MRN, date of birth, ID band Patient awake    Reviewed: Allergy & Precautions, NPO status , Patient's Chart, lab work & pertinent test results  History of Anesthesia Complications Negative for: history of anesthetic complications  Airway Mallampati: II  TM Distance: >3 FB Neck ROM: Full    Dental  (+) Poor Dentition, Dental Advisory Given   Pulmonary COPD,  COPD inhaler, former smoker,    Pulmonary exam normal        Cardiovascular negative cardio ROS Normal cardiovascular exam     Neuro/Psych negative neurological ROS  negative psych ROS   GI/Hepatic negative GI ROS, Neg liver ROS,   Endo/Other  negative endocrine ROS  Renal/GU negative Renal ROS     Musculoskeletal   Abdominal   Peds  Hematology   Anesthesia Other Findings   Reproductive/Obstetrics                            Anesthesia Physical Anesthesia Plan  ASA: III  Anesthesia Plan: MAC and Spinal   Post-op Pain Management:    Induction:   Airway Management Planned: Simple Face Mask and Natural Airway  Additional Equipment:   Intra-op Plan:   Post-operative Plan:   Informed Consent: I have reviewed the patients History and Physical, chart, labs and discussed the procedure including the risks, benefits and alternatives for the proposed anesthesia with the patient or authorized representative who has indicated his/her understanding and acceptance.   Dental advisory given  Plan Discussed with: CRNA, Anesthesiologist and Surgeon  Anesthesia Plan Comments:        Anesthesia Quick Evaluation

## 2015-09-24 NOTE — Anesthesia Procedure Notes (Signed)
Spinal Patient location during procedure: OR Start time: 09/24/2015 1:25 PM End time: 09/24/2015 1:32 PM Staffing Anesthesiologist: Duane Boston Performed by: anesthesiologist  Preanesthetic Checklist Completed: patient identified, surgical consent, pre-op evaluation, timeout performed, IV checked, risks and benefits discussed and monitors and equipment checked Spinal Block Patient position: sitting Prep: DuraPrep Patient monitoring: cardiac monitor, continuous pulse ox and blood pressure Approach: midline Location: L2-3 Injection technique: single-shot Needle Needle type: Pencan  Needle gauge: 24 G Needle length: 9 cm Additional Notes Functioning IV was confirmed and monitors were applied. Sterile prep and drape, including hand hygiene and sterile gloves were used. The patient was positioned and the spine was prepped. The skin was anesthetized with lidocaine.  Free flow of clear CSF was obtained prior to injecting local anesthetic into the CSF.  The spinal needle aspirated freely following injection.  The needle was carefully withdrawn.  The patient tolerated the procedure well.

## 2015-09-25 LAB — CBC
HCT: 41.9 % (ref 39.0–52.0)
Hemoglobin: 13.8 g/dL (ref 13.0–17.0)
MCH: 30.6 pg (ref 26.0–34.0)
MCHC: 32.9 g/dL (ref 30.0–36.0)
MCV: 92.9 fL (ref 78.0–100.0)
PLATELETS: 333 10*3/uL (ref 150–400)
RBC: 4.51 MIL/uL (ref 4.22–5.81)
RDW: 12.5 % (ref 11.5–15.5)
WBC: 25.3 10*3/uL — AB (ref 4.0–10.5)

## 2015-09-25 LAB — BASIC METABOLIC PANEL
Anion gap: 8 (ref 5–15)
BUN: 15 mg/dL (ref 6–20)
CO2: 25 mmol/L (ref 22–32)
Calcium: 8.7 mg/dL — ABNORMAL LOW (ref 8.9–10.3)
Chloride: 101 mmol/L (ref 101–111)
Creatinine, Ser: 0.87 mg/dL (ref 0.61–1.24)
GFR calc Af Amer: >60 mL/min (ref >60)
GFR calc non Af Amer: >60 mL/min (ref >60)
GLUCOSE: 203 mg/dL — AB (ref 65–99)
Potassium: 4.6 mmol/L (ref 3.5–5.1)
SODIUM: 134 mmol/L — AB (ref 135–145)

## 2015-09-25 LAB — GLUCOSE, CAPILLARY
GLUCOSE-CAPILLARY: 248 mg/dL — AB (ref 65–99)
GLUCOSE-CAPILLARY: 205 mg/dL — AB (ref 65–99)

## 2015-09-25 MED ORDER — HYDROMORPHONE HCL 1 MG/ML IJ SOLN
1.0000 mg | INTRAMUSCULAR | Status: DC | PRN
  Administered 2015-09-25: 2 mg via INTRAVENOUS
  Administered 2015-09-25: 1 mg via INTRAVENOUS
  Administered 2015-09-25 – 2015-09-27 (×17): 2 mg via INTRAVENOUS
  Filled 2015-09-25 (×19): qty 2

## 2015-09-25 MED ORDER — NICOTINE 21 MG/24HR TD PT24
21.0000 mg | MEDICATED_PATCH | Freq: Every day | TRANSDERMAL | Status: DC
  Administered 2015-09-25 – 2015-09-26 (×2): 21 mg via TRANSDERMAL
  Filled 2015-09-25 (×3): qty 1

## 2015-09-25 NOTE — Progress Notes (Signed)
Patient refused to dangle and states "im in a lot of pain and my legs is still numb". Also he prefers that the foley catheter be remove a little bit later until he could talk to the doctor.

## 2015-09-25 NOTE — Evaluation (Signed)
Physical Therapy Evaluation Patient Details Name: Wesley [name] MRN: [id number] DOB: 05/21/1965 Today's Date: 09/25/2015   History of Present Illness  Pt is s/p R DTHA. Pt with PMH asthma, CAD, COPD, chronic pain.   Clinical Impression  Pt s/p R THR presents with decreased R LE strength/ROM and post op pain limiting functional mobility.  Pt should progress to dc home with family assist and HHPT follow up.    Follow Up Recommendations Home health PT    Equipment Recommendations  Rolling walker with 5" wheels    Recommendations for Other Services OT consult     Precautions / Restrictions Precautions Precautions: Fall Restrictions Weight Bearing Restrictions: No RLE Weight Bearing: Weight bearing as tolerated      Mobility  Bed Mobility Overal bed mobility: Needs Assistance Bed Mobility: Supine to Sit     Supine to sit: Min assist     General bed mobility comments: in chair  Transfers Overall transfer level: Needs assistance Equipment used: Rolling walker (2 wheeled) Transfers: Sit to/from Stand Sit to Stand: Min guard         General transfer comment: min guard for safety as pt is impulsive at times, cues for hand placement and LE management.   Ambulation/Gait Ambulation/Gait assistance: Min assist;Min guard Ambulation Distance (Feet): 300 Feet Assistive device: Rolling walker (2 wheeled) Gait Pattern/deviations: Step-to pattern;Step-through pattern;Shuffle;Trunk flexed     General Gait Details: cues for posture, position from RW and intial sequence  Stairs            Wheelchair Mobility    Modified Rankin (Stroke Patients Only)       Balance                                             Pertinent Vitals/Pain Pain Assessment: 0-10 Pain Score: 10-Worst pain ever Pain Location: R hip Pain Descriptors / Indicators: Aching;Sore Pain Intervention(s): Limited activity within patient's tolerance;Monitored during  session;Premedicated before session;Ice applied    Home Living Family/patient expects to be discharged to:: Private residence Living Arrangements: Spouse/significant other Available Help at Discharge: Family;Available PRN/intermittently Type of Home: House Home Access: Stairs to enter Entrance Stairs-Rails: Psychiatric nurse of Steps: 2 + 1 Home Layout: One level Home Equipment: Tub bench;Walker - 4 wheels;Other (comment) Additional Comments: uses O2 intermittently. 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     Prior Function Level of Independence: Independent               Hand Dominance        Extremity/Trunk Assessment   Upper Extremity Assessment: Overall WFL for tasks assessed           Lower Extremity Assessment: RLE deficits/detail RLE Deficits / Details: Strength at hip 2+/5 with AAROM at hip to 75 flex and 15 abd - pain ltd    Cervical / Trunk Assessment: Normal  Communication   Communication: No difficulties  Cognition Arousal/Alertness: Awake/alert Behavior During Therapy: Impulsive Overall Cognitive Status: Within Functional Limits for tasks assessed                      General Comments      Exercises Total Joint Exercises Ankle Circles/Pumps: AROM;Both;15 reps;Supine Quad Sets: AROM;Both;10 reps;Supine Heel Slides: AAROM;Right;5 reps;Supine Hip ABduction/ADduction: AAROM;Right;10 reps;Supine      Assessment/Plan    PT Assessment Patient needs  continued PT services  PT Diagnosis Difficulty walking   PT Problem List Decreased strength;Decreased range of motion;Decreased activity tolerance;Decreased balance;Decreased mobility;Decreased knowledge of use of DME;Pain;Decreased safety awareness  PT Treatment Interventions DME instruction;Gait training;Stair training;Functional mobility training;Therapeutic activities;Therapeutic exercise;Patient/family education   PT Goals (Current goals can be found in the Care Plan  section) Acute Rehab PT Goals Patient Stated Goal: home when ready PT Goal Formulation: With patient Time For Goal Achievement: 10/02/15 Potential to Achieve Goals: Good    Frequency 7X/week   Barriers to discharge        Co-evaluation               End of Session Equipment Utilized During Treatment: Gait belt Activity Tolerance: Patient tolerated treatment well Patient left: in chair;with call bell/phone within reach Nurse Communication: Mobility status         Time: 1217-1257 PT Time Calculation (min) (ACUTE ONLY): 40 min   Charges:   PT Evaluation $PT Eval Low Complexity: 1 Procedure PT Treatments $Gait Training: 8-22 mins $Therapeutic Exercise: 8-22 mins   PT G Codes:        Yoshie Kosel October 03, 2015, 5:01 PM

## 2015-09-25 NOTE — Progress Notes (Signed)
Subjective: 1 Day Post-Op Procedure(s) (LRB): TOTAL HIP ARTHROPLASTY ANTERIOR APPROACH (Right) Patient reports pain as uncontrolled. Reports right hip pain.   Tolerating Po's. Denies CP,SOB, or calf pain.  Objective: Vital signs in last 24 hours: Temp:  [97.8 F (36.6 C)-98.6 F (37 C)] 98.1 F (36.7 C) (07/22 0450) Pulse Rate:  [75-105] 90 (07/22 0450) Resp:  [16-21] 16 (07/22 0450) BP: (76-121)/(37-85) 105/85 mmHg (07/22 0450) SpO2:  [95 %-100 %] 95 % (07/22 0755) Weight:  [82.918 kg (182 lb 12.8 oz)] 82.918 kg (182 lb 12.8 oz) (07/21 1120)  Intake/Output from previous day: 07/21 0701 - 07/22 0700 In: 3923.3 [P.O.:1200; I.V.:2373.3; IV Piggyback:50] Out: 2665 [Urine:2225; Drains:190; Blood:250] Intake/Output this shift: Total I/O In: 240 [P.O.:240] Out: -    Recent Labs  09/25/15 0508  HGB 13.8    Recent Labs  09/25/15 0508  WBC 25.3*  RBC 4.51  HCT 41.9  PLT 333    Recent Labs  09/25/15 0508  NA 134*  K 4.6  CL 101  CO2 25  BUN 15  CREATININE 0.87  GLUCOSE 203*  CALCIUM 8.7*   No results for input(s): LABPT, INR in the last 72 hours.  Well nourished. Alert and oriented x3. RRR, Lungs clear, BS x4. Abdomen soft and non tender. Right Calf soft and non tender. Right hip dressing C/D/I. No DVT signs. Compartment soft. No signs of infection.  Right LE grossly neurovascular intact. Drain d/c'ed with tip intact.  Assessment/Plan: 1 Day Post-Op Procedure(s) (LRB): TOTAL HIP ARTHROPLASTY ANTERIOR APPROACH (Right) Drain d/c'ed Up with PT Change to Dilaudid Iv for pain Continue current pain  Leukocytosis: Monitor labs Discussed with Dr.Collins  STILWELL, BRYSON L 09/25/2015, 9:22 AM

## 2015-09-25 NOTE — Progress Notes (Signed)
Physical Therapy Treatment Patient Details Name: Wesley [name] MRN: [id number] DOB: [birth year] Today's Date: 09/25/2015    History of Present Illness Pt is s/p R DTHA. Pt with PMH asthma, CAD, COPD, chronic pain.     PT Comments    Pt very motivated to progress but impulsive and needing cues to slow pace for safety and not overdo.    Follow Up Recommendations  Home health PT     Equipment Recommendations  Rolling walker with 5" wheels    Recommendations for Other Services OT consult     Precautions / Restrictions Precautions Precautions: Fall Restrictions Weight Bearing Restrictions: No RLE Weight Bearing: Weight bearing as tolerated    Mobility  Bed Mobility Overal bed mobility: Needs Assistance Bed Mobility: Supine to Sit     Supine to sit: Min assist     General bed mobility comments: Pt OOB and declines back to bed  Transfers Overall transfer level: Needs assistance Equipment used: Rolling walker (2 wheeled) Transfers: Sit to/from Stand Sit to Stand: Min guard         General transfer comment: min guard for safety as pt is impulsive at times, cues for hand placement and LE management.   Ambulation/Gait Ambulation/Gait assistance: Min assist;Min guard Ambulation Distance (Feet): 300 Feet Assistive device: Rolling walker (2 wheeled) Gait Pattern/deviations: Step-through pattern;Shuffle;Trunk flexed     General Gait Details: cues for posture, position from RW and intial sequence   Stairs            Wheelchair Mobility    Modified Rankin (Stroke Patients Only)       Balance Overall balance assessment: Needs assistance Sitting-balance support: No upper extremity supported;Feet supported Sitting balance-Leahy Scale: Good     Standing balance support: No upper extremity supported Standing balance-Leahy Scale: Fair                      Cognition Arousal/Alertness: Awake/alert Behavior During Therapy: Impulsive Overall  Cognitive Status: Within Functional Limits for tasks assessed                      Exercises Total Joint Exercises Ankle Circles/Pumps: AROM;Both;15 reps;Supine Quad Sets: AROM;Both;10 reps;Supine Heel Slides: AAROM;Right;5 reps;Supine Hip ABduction/ADduction: AAROM;Right;10 reps;Supine    General Comments        Pertinent Vitals/Pain Pain Assessment: 0-10 Pain Score: 10-Worst pain ever Pain Location: R hip Pain Descriptors / Indicators: Aching;Sore Pain Intervention(s): Limited activity within patient's tolerance;Monitored during session;Premedicated before session;Ice applied    Home Living Family/patient expects to be discharged to:: Private residence Living Arrangements: Spouse/significant other Available Help at Discharge: Family;Available PRN/intermittently Type of Home: House Home Access: Stairs to enter Entrance Stairs-Rails: Right;Left Home Layout: One level Home Equipment: Tub bench;Walker - 4 wheels;Other (comment) Additional Comments: uses O2 intermittently. 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     Prior Function Level of Independence: Independent          PT Goals (current goals can now be found in the care plan section) Acute Rehab PT Goals Patient Stated Goal: home when ready PT Goal Formulation: With patient Time For Goal Achievement: 10/02/15 Potential to Achieve Goals: Good Progress towards PT goals: Progressing toward goals    Frequency  7X/week    PT Plan Current plan remains appropriate    Co-evaluation             End of Session Equipment Utilized During Treatment: Gait belt Activity Tolerance: Patient tolerated  treatment well Patient left: in chair;with call bell/phone within reach;with family/visitor present     Time: [id number] PT Time Calculation (min) (ACUTE ONLY): 20 min  Charges:  $Gait Training: 8-22 mins $Therapeutic Exercise: 8-22 mins                    G Codes:      Dayden Viverette Oct 25, 2015, 5:05  PM

## 2015-09-25 NOTE — Progress Notes (Signed)
Patient complains about having constant pain despite giving pain medicines he can have , states " what you are giving me is not working,because I am not getting what I'm taking at home that is working " .  Advice to discuss it with provider when they do their rounds.

## 2015-09-25 NOTE — Evaluation (Signed)
Occupational Therapy Evaluation Patient Details Name: Wesley [name] MRN: [id number] DOB: April 05, 1965 Today's Date: 09/25/2015    History of Present Illness Pt is s/p R DTHA. Pt with PMH asthma, CAD, COPD, chronic pain.    Clinical Impression   Pt noted to be impulsive at times during activity. Instructed on AE use and practiced with functional transfers to 3in1 with walker. Pt wears O2 intermittently at home and noted to be 1-2/4 dyspnea without O2 during activity. Able to recover with rest and O2 placed back on after activity. Will continue to follow on acute to progress ADL independence for d/c home.     Follow Up Recommendations  No OT follow up;Supervision/Assistance - 24 hour    Equipment Recommendations  Other (comment) (RW (OT))    Recommendations for Other Services       Precautions / Restrictions Precautions Precautions: Fall Restrictions Weight Bearing Restrictions: No RLE Weight Bearing: Weight bearing as tolerated      Mobility Bed Mobility               General bed mobility comments: in chair  Transfers Overall transfer level: Needs assistance Equipment used: Rolling walker (2 wheeled) Transfers: Sit to/from Stand Sit to Stand: Min guard         General transfer comment: min guard for safety as pt is impulsive at times, cues for hand placement and LE management.     Balance                                            ADL Overall ADL's : Needs assistance/impaired Eating/Feeding: Independent;Sitting   Grooming: Wash/dry hands;Set up;Sitting   Upper Body Bathing: Set up;Sitting   Lower Body Bathing: Minimal assistance;Sit to/from stand   Upper Body Dressing : Set up;Sitting   Lower Body Dressing: Moderate assistance;Sit to/from stand   Toilet Transfer: Min guard;Ambulation;RW;BSC   Toileting- Water quality scientist and Hygiene: Min guard         General ADL Comments: Pt can be impulsive at times, lifting walker  off the floor and not fully backing up to 3in1 before trying to sit. Pt interested in AE but reports some financial concerns with obtaining AE. Pt asking if he has to use 3in1 but with pt's 9/10 pain level and he is dependent on UE support of 3in1 handles, recommended pt at least start out with using 3in1 for safety.  Pt practiced with sock aid and reacher but would benefit from additional practice.      Vision     Perception     Praxis      Pertinent Vitals/Pain Pain Assessment: 0-10 Pain Score: 9  Pain Location: R hip Pain Descriptors / Indicators: Sharp;Aching Pain Intervention(s): Monitored during session;Repositioned;Ice applied     Hand Dominance     Extremity/Trunk Assessment Upper Extremity Assessment Upper Extremity Assessment: Overall WFL for tasks assessed           Communication Communication Communication: No difficulties   Cognition Arousal/Alertness: Awake/alert Behavior During Therapy: Impulsive Overall Cognitive Status: Within Functional Limits for tasks assessed                     General Comments       Exercises       Shoulder Instructions      Home Living Family/patient expects to be discharged to:: Private residence Living Arrangements:  Spouse/significant other Available Help at Discharge: Family;Available PRN/intermittently   Home Access: Stairs to enter Entrance Stairs-Number of Steps: 2 + 1 Entrance Stairs-Rails: Right;Left       Bathroom Shower/Tub: Teacher, early years/pre: Standard     Home Equipment: Tub bench;Walker - 4 wheels;Other (comment) (O2)   Additional Comments: uses O2 intermittently. 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       Prior Functioning/Environment Level of Independence: Independent             OT Diagnosis: Generalized weakness   OT Problem List: Decreased strength;Decreased knowledge of use of DME or AE;Decreased safety awareness   OT Treatment/Interventions: Self-care/ADL  training;DME and/or AE instruction;Therapeutic activities;Patient/family education    OT Goals(Current goals can be found in the care plan section) Acute Rehab OT Goals Patient Stated Goal: home when ready OT Goal Formulation: With patient Time For Goal Achievement: 10/02/15 Potential to Achieve Goals: Good  OT Frequency: Min 2X/week   Barriers to D/C:            Co-evaluation              End of Session Equipment Utilized During Treatment: Rolling walker  Activity Tolerance: Patient tolerated treatment well Patient left: in chair;with call bell/phone within reach   Time: [id number] OT Time Calculation (min): 42 min Charges:  OT General Charges $OT Visit: 1 Procedure OT Evaluation $OT Eval Low Complexity: 1 Procedure OT Treatments $Self Care/Home Management : 8-22 mins $Therapeutic Activity: 8-22 mins G-Codes:    Jules Schick 09/25/2015, 3:11 PM

## 2015-09-26 ENCOUNTER — Inpatient Hospital Stay (HOSPITAL_COMMUNITY): Payer: Medicare Other

## 2015-09-26 DIAGNOSIS — D72829 Elevated white blood cell count, unspecified: Secondary | ICD-10-CM

## 2015-09-26 DIAGNOSIS — G8929 Other chronic pain: Secondary | ICD-10-CM

## 2015-09-26 DIAGNOSIS — J438 Other emphysema: Secondary | ICD-10-CM

## 2015-09-26 LAB — CBC
HEMATOCRIT: 41.4 % (ref 39.0–52.0)
HEMOGLOBIN: 13.1 g/dL (ref 13.0–17.0)
MCH: 30.2 pg (ref 26.0–34.0)
MCHC: 31.6 g/dL (ref 30.0–36.0)
MCV: 95.4 fL (ref 78.0–100.0)
Platelets: 310 10*3/uL (ref 150–400)
RBC: 4.34 MIL/uL (ref 4.22–5.81)
RDW: 12.6 % (ref 11.5–15.5)
WBC: 29.9 10*3/uL — ABNORMAL HIGH (ref 4.0–10.5)

## 2015-09-26 LAB — URINALYSIS, ROUTINE W REFLEX MICROSCOPIC
Bilirubin Urine: NEGATIVE
Glucose, UA: NEGATIVE mg/dL
HGB URINE DIPSTICK: NEGATIVE
Ketones, ur: NEGATIVE mg/dL
Leukocytes, UA: NEGATIVE
Nitrite: NEGATIVE
Protein, ur: NEGATIVE mg/dL
SPECIFIC GRAVITY, URINE: 1.013 (ref 1.005–1.030)
pH: 7 (ref 5.0–8.0)

## 2015-09-26 LAB — BASIC METABOLIC PANEL
Anion gap: 6 (ref 5–15)
BUN: 14 mg/dL (ref 6–20)
CHLORIDE: 102 mmol/L (ref 101–111)
CO2: 29 mmol/L (ref 22–32)
CREATININE: 0.89 mg/dL (ref 0.61–1.24)
Calcium: 8.9 mg/dL (ref 8.9–10.3)
GFR calc non Af Amer: >60 mL/min (ref >60)
Glucose, Bld: 143 mg/dL — ABNORMAL HIGH (ref 65–99)
Potassium: 4.9 mmol/L (ref 3.5–5.1)
Sodium: 137 mmol/L (ref 135–145)

## 2015-09-26 LAB — DIFFERENTIAL
Basophils Absolute: 0 10*3/uL (ref 0.0–0.1)
Basophils Relative: 0 %
Eosinophils Absolute: 0 10*3/uL (ref 0.0–0.7)
Eosinophils Relative: 0 %
LYMPHS PCT: 4 %
Lymphs Abs: 1.1 10*3/uL (ref 0.7–4.0)
MONO ABS: 2.5 10*3/uL — AB (ref 0.1–1.0)
MONOS PCT: 8 %
NEUTROS ABS: 26.6 10*3/uL — AB (ref 1.7–7.7)
Neutrophils Relative %: 88 %

## 2015-09-26 MED ORDER — GUAIFENESIN ER 600 MG PO TB12
600.0000 mg | ORAL_TABLET | Freq: Two times a day (BID) | ORAL | Status: DC
  Administered 2015-09-26 – 2015-09-27 (×2): 600 mg via ORAL
  Filled 2015-09-26 (×2): qty 1

## 2015-09-26 MED ORDER — ALBUTEROL SULFATE (2.5 MG/3ML) 0.083% IN NEBU
3.0000 mL | INHALATION_SOLUTION | Freq: Four times a day (QID) | RESPIRATORY_TRACT | Status: DC
  Administered 2015-09-27: 3 mL via RESPIRATORY_TRACT
  Filled 2015-09-26: qty 3

## 2015-09-26 NOTE — Consult Note (Signed)
Medical Consultation   Wesley [name]  [id number]  DOB: 14-Jan-1966  DOA: 09/24/2015  PCP: [name], MD  Outpatient Specialists:   Requesting physician: Dr. Tonita Cong  Reason for consultation: Leukocytosis   History of Present Illness: Wesley [name] is an 50 y.o. male with past medical history of chronic pain, COPD and tobacco abuse. Patient admitted electively to the hospital for right hip arthroplasty. He developed leukocytosis so [facility] hospitalist was consulted for further evaluation. He was recently in [facility] admitted for COPD exacerbation and was discharged on antibiotics and prednisone taper. Upon admission to the hospital for hip arthroplasty was placed on higher dose of prednisone. He denies any fever, denies any chills, he does have some sputum production which is probably normal with someone with COPD. No more wheezing or SOB.  Review of Systems:  ROS Constitutional: negative for anorexia, fevers and sweats Eyes: negative for irritation, redness and visual disturbance Ears, nose, mouth, throat, and face: negative for earaches, epistaxis, nasal congestion and sore throat Respiratory: Denies any cough, changes in his sputum production, no wheezing or SOB. Cardiovascular: negative for chest pain, dyspnea, lower extremity edema, orthopnea, palpitations and syncope Gastrointestinal: negative for abdominal pain, constipation, diarrhea, melena, nausea and vomiting Genitourinary:negative for dysuria, frequency and hematuria Hematologic/lymphatic: negative for bleeding, easy bruising and lymphadenopathy Musculoskeletal:negative for arthralgias, muscle weakness and stiff joints Neurological: negative for coordination problems, gait problems, headaches and weakness Endocrine: negative for diabetic symptoms including polydipsia, polyuria and weight loss Allergic/Immunologic: negative for anaphylaxis, hay fever and urticaria     Past Medical  History: Past Medical History:  Diagnosis Date  . Avascular necrosis of femur ([place])   . Back pain   . COPD (chronic obstructive pulmonary disease) ([place])   . Neuropathy ([place])   . Osteoporosis   . Pulmonary asbestosis ([place])   . Tumor of lung    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, 2017    Past Surgical History: Past Surgical History:  Procedure Laterality Date  . BACK SURGERY    . EYE SURGERY     left eye cornea and lens replaced- blind in left eye  . JOINT REPLACEMENT    . LUNG SURGERY    . TOTAL HIP ARTHROPLASTY Right 09/24/2015   Procedure: TOTAL HIP ARTHROPLASTY ANTERIOR APPROACH;  Surgeon: Gaynelle Arabian, MD;  Location: [facility];  Service: Orthopedics;  Laterality: Right;   Allergies:   Allergies  Allergen Reactions  . Ambien [Zolpidem Tartrate] Other (See Comments)    hallucinations  . Melatonin Itching  . Zolpidem Other (See Comments)    Altered mental status   Social History:  reports that he quit smoking about 4 years ago. He smoked 1.00 pack per day. He does not have any smokeless tobacco history on file. He reports that he uses drugs, including Oxycodone and Morphine. He reports that he does not drink alcohol.  Family History: Family History  Problem Relation Age of Onset  . Heart failure Father   . Diabetes Father   . Diabetes Mother   . Cancer Mother     male cancer   Physical Exam: Vitals:   09/25/15 1954 09/25/15 2145 09/26/15 0526 09/26/15 0839  BP:  (!) 116/59 (!) 117/42   Pulse:  84 60   Resp:  18 16   Temp:  98.4 F (36.9 C) 98.4 F (36.9 C)   TempSrc:  Oral Oral   SpO2: 95% 100% 99% 92%  Weight:      Height:        Constitutional: Alert and awake, oriented x3, not in any acute distress. Eyes: PERLA, EOMI, irises appear normal, anicteric sclera,  ENMT: external ears and nose appear norma            Lips appears normal, oropharynx mucosa, tongue, posterior pharynx appear normal  Neck: neck appears normal, no masses,  normal ROM, no thyromegaly, no JVD  CVS: S1-S2 clear, no murmur rubs or gallops, no LE edema, normal pedal pulses  Respiratory:  clear to auscultation bilaterally, no wheezing, rales or rhonchi. Respiratory effort normal. No accessory muscle use.  Abdomen: soft nontender, nondistended, normal bowel sounds, no hepatosplenomegaly, no hernias  Musculoskeletal: : no cyanosis, clubbing or edema noted bilaterally              Neuro: Cranial nerves II-XII intact, strength, sensation, reflexes Psych: judgement and insight appear normal, stable mood and affect, mental status Skin: no rashes or lesions or ulcers, no induration or nodules  Data reviewed:  I have personally reviewed following labs and imaging studies  Labs:  CBC:  Recent Labs Lab 09/25/15 0508 09/26/15 0440  WBC 25.3* 29.9*  NEUTROABS  --  26.6*  HGB 13.8 13.1  HCT 41.9 41.4  MCV 92.9 95.4  PLT 333 [id number]    Basic Metabolic Panel:  Recent Labs Lab 09/25/15 0508 09/26/15 0440  NA 134* 137  K 4.6 4.9  CL 101 102  CO2 25 29  GLUCOSE 203* 143*  BUN 15 14  CREATININE 0.87 0.89  CALCIUM 8.7* 8.9   GFR Estimated Creatinine Clearance: 109.7 mL/min (by C-G formula based on SCr of 0.89 mg/dL). Liver Function Tests: No results for input(s): AST, ALT, ALKPHOS, BILITOT, PROT, ALBUMIN in the last 168 hours. No results for input(s): LIPASE, AMYLASE in the last 168 hours. No results for input(s): AMMONIA in the last 168 hours. Coagulation profile No results for input(s): INR, PROTIME in the last 168 hours.  Cardiac Enzymes: No results for input(s): CKTOTAL, CKMB, CKMBINDEX, TROPONINI in the last 168 hours. BNP: Invalid input(s): POCBNP CBG:  Recent Labs Lab 09/25/15 1845 09/25/15 2205  GLUCAP 248* 205*   D-Dimer No results for input(s): DDIMER in the last 72 hours. Hgb A1c No results for input(s): HGBA1C in the last 72 hours. Lipid Profile No results for input(s): CHOL, HDL, LDLCALC, TRIG, CHOLHDL, LDLDIRECT in  the last 72 hours. Thyroid function studies No results for input(s): TSH, T4TOTAL, T3FREE, THYROIDAB in the last 72 hours.  Invalid input(s): FREET3 Anemia work up No results for input(s): VITAMINB12, FOLATE, FERRITIN, TIBC, IRON, RETICCTPCT in the last 72 hours. Urinalysis    Component Value Date/Time   COLORURINE YELLOW 09/15/2015 1504   APPEARANCEUR CLEAR 09/15/2015 1504   LABSPEC 1.030 09/15/2015 1504   PHURINE 5.5 09/15/2015 1504   GLUCOSEU NEGATIVE 09/15/2015 1504   HGBUR NEGATIVE 09/15/2015 1504   BILIRUBINUR NEGATIVE 09/15/2015 1504   KETONESUR NEGATIVE 09/15/2015 1504   PROTEINUR NEGATIVE 09/15/2015 1504   UROBILINOGEN 0.2 05/23/2014 0102   NITRITE NEGATIVE 09/15/2015 1504   LEUKOCYTESUR NEGATIVE 09/15/2015 1504     Microbiology No results found for this or any previous visit (from the past 240 hour(s)).     Inpatient Medications:   Scheduled Meds: . docusate sodium  100 mg Oral BID  . furosemide  40 mg Oral Daily  . gabapentin  600 mg Oral TID  .  mometasone-formoterol  2 puff Inhalation BID  . morphine  30 mg Oral Q8H  . nicotine  21 mg Transdermal Daily  . predniSONE  40 mg Oral BID WC  . [START ON 09/27/2015] predniSONE  40 mg Oral Q breakfast  . rivaroxaban  10 mg Oral Q breakfast  . tiotropium  18 mcg Inhalation Daily   Continuous Infusions: . sodium chloride 100 mL/hr at 09/24/15 1641     Radiological Exams on Admission: Dg Chest 2 View  Result Date: 09/26/2015 CLINICAL DATA:  Cough. EXAM: CHEST  2 VIEW COMPARISON:  Multiple chest x-rays since May 22, 2014 FINDINGS: Emphysematous changes and scarring are seen in both lungs, unchanged. Slightly more focal opacity in the lateral left lung is unchanged. No pulmonary nodules or masses. No change in the cardiomediastinal silhouette. IMPRESSION: No interval change.  Chronic emphysematous changes and scarring. Electronically Signed   By: Dorise Bullion III M.D   On: 09/26/2015 09:59  Dg Pelvis  Portable  Result Date: 09/24/2015 CLINICAL DATA:  Status post total hip arthroplasty anterior approach. History of osteoporosis. EXAM: DG C-ARM 1-60 MIN-NO REPORT; PORTABLE PELVIS 1-2 VIEWS COMPARISON:  None. FINDINGS: Patient is status post bilateral hip arthroplasties. Hardware appears intact and appropriately positioned bilaterally. No evidence of a surgical complicating feature. Adjacent osseous structures appear intact and anatomic in alignment. Expected surgical changes seen within the soft tissues adjacent to the right hip, with drain in place. IMPRESSION: Status post right hip arthroplasty. Expected postsurgical changes within the adjacent soft tissues, with drain in place. The new prosthetic hardware appears intact and appropriately positioned. Left hip arthroplasty hardware also remains intact and appropriately positioned. Electronically Signed   By: Franki Cabot M.D.   On: 09/24/2015 15:43   Dg C-arm 1-60 Min-no Report  Result Date: 09/24/2015 CLINICAL DATA: SURGERY C-ARM 1-60 MINUTES Fluoroscopy was utilized by the requesting physician.  No radiographic interpretation.    Impression/Recommendations Principal Problem:   OA (osteoarthritis) of hip Active Problems:   Leukocytosis   COPD (chronic obstructive pulmonary disease) (HCC)   Chronic pain   Leukocytosis -This is likely secondary to steroids and not related to infection, no fever or chills or symptoms of infection. -CXR did not show acute findings, chronic emphysematous changes. He does not have no symptoms. -He was treated last week with antibiotics for COPD exacerbation. -I'll add incentive spirometry, Mucinex and ambulate as soon as it appropriate. Obtain urinalysis -I do not recommend repeating antibiotics there is no evidence of infection, discontinue prednisone.  COPD -Continue bronchodilators, mucolytics and antitussives as needed.  Chronic pain -Patient is on MS Contin at home continue home medications.  Thank  you for this consultation.  Our [facility] hospitalist team will follow the patient with you.   Time Spent: 45 minutes  Whitley Patchen A M.D. [facility] 09/26/2015, 10:44 AM

## 2015-09-26 NOTE — Progress Notes (Signed)
Occupational Therapy Treatment Patient Details Name: Wesley [name] MRN: [id number] DOB: 1965-04-02 Today's Date: 09/26/2015    History of present illness Pt is s/p R DTHA. Pt with PMH asthma, CAD, COPD, chronic pain.    OT comments  Pt requires mod A for LB ADLs - he indicates wife will assist him with this at discharge.  He is able to reach to Rt mid foot while simulating dressing, but reports increased pain - RN notified.  Pt has DME.  He is very impulsive and at risk for fall.  DOE 2/4 and DOE 2/4 on RA.    Follow Up Recommendations  No OT follow up;Supervision/Assistance - 24 hour    Equipment Recommendations  None recommended by OT    Recommendations for Other Services      Precautions / Restrictions Precautions Precautions: Fall Restrictions Weight Bearing Restrictions: No RLE Weight Bearing: Weight bearing as tolerated       Mobility Bed Mobility Overal bed mobility: Needs Assistance Bed Mobility: Supine to Sit     Supine to sit: Supervision        Transfers Overall transfer level: Needs assistance Equipment used: Rolling walker (2 wheeled) Transfers: Sit to/from [facility] Sit to Stand: Min guard Stand pivot transfers: Min guard       General transfer comment: Pt with poor safety awareness, is at risk for fall     Balance Overall balance assessment: Needs assistance Sitting-balance support: Feet supported Sitting balance-Leahy Scale: Good     Standing balance support: No upper extremity supported Standing balance-Leahy Scale: Fair                     ADL Overall ADL's : Needs assistance/impaired             Lower Body Bathing: Minimal assistance;Sit to/from stand       Lower Body Dressing: Moderate assistance;Sit to/from stand Lower Body Dressing Details (indicate cue type and reason): Pt able to reach down toward mid foot today.  He is not interested in AE and reports his wife can assist him at discharge.   Toilet Transfer: Min guard;Ambulation;Comfort height toilet;BSC;RW Toilet Transfer Details (indicate cue type and reason): Pt requires cues for hand placement, and to back up to chair before sitting.  Pt very impulsive and unsafe  Toileting- Clothing Manipulation and Hygiene: Min Child psychotherapist Details (indicate cue type and reason): Pt indicates he has a tub transfer bench and is able to describe technique  Functional mobility during ADLs: Min guard;Rolling walker General ADL Comments: Pt demonstrates poor safety awareness.  He is very impulsive and argumentative.  Cautioned pt against wearing slip on shoe on Rt foot, but pt insistent he is safe,  Discussed increased risk of fall.  Pt with c/o increasing hip pain as the session progressed.  He insists that "something Popped" in his hip as he leaned forward to access his foot while simulating dressing.  RN notified who gave him muscle relaxers, and pt repositioned and ice placed on Rt hip`      Vision                     Perception     Praxis      Cognition   Behavior During Therapy: Impulsive Overall Cognitive Status: Within Functional Limits for tasks assessed  Extremity/Trunk Assessment               Exercises     Shoulder Instructions       General Comments      Pertinent Vitals/ Pain       Pain Assessment: 0-10 Pain Score: 10-Worst pain ever Pain Location: Rt hip  Pain Descriptors / Indicators: Aching;Moaning;Grimacing;Guarding;Restless;Sharp;Stabbing Pain Intervention(s): Limited activity within patient's tolerance;Repositioned;Ice applied;RN gave pain meds during session;Patient requesting pain meds-RN notified  Home Living                                          Prior Functioning/Environment              Frequency Min 2X/week     Progress Toward Goals  OT Goals(current goals can now be found in the care plan section)   Progress towards OT goals: Progressing toward goals  ADL Goals Pt Will Perform Grooming: with supervision;standing Pt Will Perform Lower Body Bathing: with supervision;with adaptive equipment;sit to/from stand Pt Will Perform Lower Body Dressing: with supervision;with adaptive equipment;sit to/from stand Pt Will Transfer to Toilet: with supervision;ambulating;bedside commode Pt Will Perform Toileting - Clothing Manipulation and hygiene: with supervision;sit to/from stand Pt Will Perform Tub/Shower Transfer: Tub transfer;tub bench;rolling walker  Plan Discharge plan remains appropriate    Co-evaluation                 End of Session Equipment Utilized During Treatment: Rolling walker   Activity Tolerance Patient limited by pain   Patient Left Other (comment) (w/c to go to x-ray)   Nurse Communication Mobility status;Patient requests pain meds        Time: [id number] OT Time Calculation (min): 18 min  Charges: OT General Charges $OT Visit: 1 Procedure OT Treatments $Self Care/Home Management : 8-22 mins  Caera Enwright M 09/26/2015, 9:39 AM

## 2015-09-26 NOTE — Progress Notes (Addendum)
Pt crying & shaking, c/o extreme pain to rt hip after OT, stating he felt a pop and the onset of pain when bending down during ADL teaching. Now has redness in rt groin. Notified Dierdre Highman, [place], by phone & orders received. Jayceon Troy, [employer]

## 2015-09-26 NOTE — Care Management Note (Signed)
Case Management Note  Patient Details  Name: Wesley [name] MRN: [id number] Date of Birth: 07-Jan-1966  Subjective/Objective:                  s/p R DTHA  Action/Plan: CM spoke with patient at the bedside. [facility] ([facility]) was arranged pre-operatively. Patient needs a RW. [facility] at [facility] notified of the RW order and will deliver it to patient's room.   Expected Discharge Date:    unknown              Expected Discharge Plan:  [place]  In-House Referral:     Discharge planning Services  CM Consult  Post Acute Care Choice:  Home Health Choice offered to:  Patient  DME Arranged:  [facility] DME Agency:  [place]:  PT [place]:  [facility] (now [facility])  Status of Service:  Completed, signed off  If discussed at [place] of Stay Meetings, dates discussed:    Additional Comments:  Apolonio Schneiders, RN 09/26/2015, 11:13 AM

## 2015-09-26 NOTE — Progress Notes (Signed)
PT Cancellation Note  Patient Details Name: Wesley [name] MRN: [id number] DOB: 1966-01-25   Cancelled Treatment:     PT deferred this am - pt for chest Xray and for hip Xray 2* severe R hip pain.  Will follow   Enedelia Martorelli 09/26/2015, 12:07 PM

## 2015-09-26 NOTE — Progress Notes (Signed)
PT Cancellation Note  Patient Details Name: Wesley [name] MRN: [id number] DOB: Dec 20, 1965   Cancelled Treatment:     PT deferred this pm at request of pt "It just hurts so bad, I cant do anything with it".  Will follow.   Dmauri Rosenow 09/26/2015, 3:31 PM

## 2015-09-26 NOTE — Progress Notes (Signed)
Subjective: 2 Days Post-Op Procedure(s) (LRB): TOTAL HIP ARTHROPLASTY ANTERIOR APPROACH (Right) Patient reports pain as 3 on 0-10 scale.   Productive cough. Was in hospital last week with pnemonia. Objective: Vital signs in last 24 hours: Temp:  [97.7 F (36.5 C)-99 F (37.2 C)] 98.4 F (36.9 C) (07/23 0526) Pulse Rate:  [60-111] 60 (07/23 0526) Resp:  [16-18] 16 (07/23 0526) BP: (116-129)/(42-66) 117/42 (07/23 0526) SpO2:  [94 %-100 %] 99 % (07/23 0526)  Intake/Output from previous day: 07/22 0701 - 07/23 0700 In: 1440 [P.O.:1440] Out: 4925 [Urine:4925] Intake/Output this shift: No intake/output data recorded.   Recent Labs  09/25/15 0508 09/26/15 0440  HGB 13.8 13.1    Recent Labs  09/25/15 0508 09/26/15 0440  WBC 25.3* 29.9*  RBC 4.51 4.34  HCT 41.9 41.4  PLT 333 310    Recent Labs  09/25/15 0508 09/26/15 0440  NA 134* 137  K 4.6 4.9  CL 101 102  CO2 25 29  BUN 15 14  CREATININE 0.87 0.89  GLUCOSE 203* 143*  CALCIUM 8.7* 8.9   No results for input(s): LABPT, INR in the last 72 hours.  Neurologically intact Dsg c/d/i No DVT Assessment/Plan: 2 Days Post-Op Procedure(s) (LRB): TOTAL HIP ARTHROPLASTY ANTERIOR APPROACH (Right) Advance diet Up with therapy Leukocytosis steroids vs pneumonia CXR [facility] consult Wesley [name] C 09/26/2015, 8:07 AM

## 2015-09-27 LAB — CBC
HEMATOCRIT: 40.4 % (ref 39.0–52.0)
HEMOGLOBIN: 13 g/dL (ref 13.0–17.0)
MCH: 30.2 pg (ref 26.0–34.0)
MCHC: 32.2 g/dL (ref 30.0–36.0)
MCV: 94 fL (ref 78.0–100.0)
Platelets: 320 10*3/uL (ref 150–400)
RBC: 4.3 MIL/uL (ref 4.22–5.81)
RDW: 13.1 % (ref 11.5–15.5)
WBC: 24 10*3/uL — ABNORMAL HIGH (ref 4.0–10.5)

## 2015-09-27 NOTE — Progress Notes (Signed)
Physical Therapy Treatment Patient Details Name: Wesley [name] MRN: [id number] DOB: [birth year] Today's Date: 09/27/2015    History of Present Illness Pt is s/p R DTHA. Pt with PMH asthma, CAD, COPD, chronic pain.     PT Comments    POD # 3 Pt eager to get home.   Assisted with amb in hallway with instruction on safety.  Performed THR TE's following HEP handout.  Instructed on proper tech and freq as well as use of ICE.   Follow Up Recommendations  Home health PT     Equipment Recommendations  Rolling walker with 5" wheels    Recommendations for Other Services       Precautions / Restrictions Precautions Precautions: Fall Restrictions Weight Bearing Restrictions: No RLE Weight Bearing: Weight bearing as tolerated    Mobility  Bed Mobility               General bed mobility comments: Pt OOB   Transfers Overall transfer level: Needs assistance Equipment used: Rolling walker (2 wheeled) Transfers: Sit to/from Stand Sit to Stand: Supervision         General transfer comment: impulsive  Ambulation/Gait Ambulation/Gait assistance: Supervision Ambulation Distance (Feet): 242 Feet Assistive device: Rolling walker (2 wheeled) Gait Pattern/deviations: Step-through pattern     General Gait Details: cues for posture, position from RW and intial sequence  Impulsive   Stairs Stairs:  (No stairs to enter home)          Wheelchair Mobility    Modified Rankin (Stroke Patients Only)       Balance                                    Cognition Arousal/Alertness: Awake/alert   Overall Cognitive Status: Within Functional Limits for tasks assessed                      Exercises   Total Hip Replacement TE's 10 reps ankle pumps 10 reps knee presses 10 reps heel slides 10 reps SAQ's 10 reps ABD Followed by ICE     General Comments        Pertinent Vitals/Pain Pain Assessment: 0-10 Pain Score: 10-Worst pain ever Pain  Location: R hip Pain Descriptors / Indicators: Grimacing;Sore;Tender Pain Intervention(s): Repositioned    Home Living                      Prior Function            PT Goals (current goals can now be found in the care plan section) Progress towards PT goals: Progressing toward goals    Frequency  7X/week    PT Plan Current plan remains appropriate    Co-evaluation             End of Session Equipment Utilized During Treatment: Gait belt Activity Tolerance: Patient tolerated treatment well Patient left: in chair;with call bell/phone within reach;with family/visitor present     Time: 0921-0948 PT Time Calculation (min) (ACUTE ONLY): 27 min  Charges:  $Gait Training: 8-22 mins $Therapeutic Exercise: 8-22 mins                    G Codes:      Rica Koyanagi  PTA [facility]  Acute  Rehab Pager      [phone]

## 2015-09-27 NOTE — Discharge Summary (Signed)
Physician Discharge Summary   Patient ID: Wesley [name] MRN: [id number] DOB/AGE: 52-Nov-1967 52 y.o.  Admit date: 09/24/2015 Discharge date: 09/27/2015  Primary Diagnosis: Avascular necrosis of the Right hip.   Admission Diagnoses:  Past Medical History:  Diagnosis Date  . Avascular necrosis of femur ([place])   . Back pain   . COPD (chronic obstructive pulmonary disease) ([place])   . Neuropathy ([place])   . Osteoporosis   . Pulmonary asbestosis ([place])   . Tumor of lung    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, 2017   Discharge Diagnoses:   Principal Problem:   OA (osteoarthritis) of hip Active Problems:   Leukocytosis   COPD (chronic obstructive pulmonary disease) ([facility])   Chronic pain  Estimated body mass index is 24.12 kg/m as calculated from the following:   Height as of this encounter: 6' 1"  (1.854 m).   Weight as of this encounter: 82.9 kg (182 lb 12.8 oz).  Procedure(s) (LRB): TOTAL HIP ARTHROPLASTY ANTERIOR APPROACH (Right)   Consults: None  HPI: Wesley [name] is a 50 y.o. male who has advanced end-  stage osteonecrosis of his Right  hip with progressively worsening pain and  dysfunction.The patient has failed nonoperative management and presents for  total hip arthroplasty.   Laboratory Data: Admission on 09/24/2015  Component Date Value Ref Range Status  . WBC 09/25/2015 25.3* 4.0 - 10.5 K/uL Final  . RBC 09/25/2015 4.51  4.22 - 5.81 MIL/uL Final  . Hemoglobin 09/25/2015 13.8  13.0 - 17.0 g/dL Final  . HCT 09/25/2015 41.9  39.0 - 52.0 % Final  . MCV 09/25/2015 92.9  78.0 - 100.0 fL Final  . MCH 09/25/2015 30.6  26.0 - 34.0 pg Final  . MCHC 09/25/2015 32.9  30.0 - 36.0 g/dL Final  . RDW 09/25/2015 12.5  11.5 - 15.5 % Final  . Platelets 09/25/2015 333  150 - 400 K/uL Final  . Sodium 09/25/2015 134* 135 - 145 mmol/L Final  . Potassium 09/25/2015 4.6  3.5 - 5.1 mmol/L Final  . Chloride 09/25/2015 101  101 - 111 mmol/L Final  . CO2 09/25/2015 25   22 - 32 mmol/L Final  . Glucose, Bld 09/25/2015 203* 65 - 99 mg/dL Final  . BUN 09/25/2015 15  6 - 20 mg/dL Final  . Creatinine, Ser 09/25/2015 0.87  0.61 - 1.24 mg/dL Final  . Calcium 09/25/2015 8.7* 8.9 - 10.3 mg/dL Final  . GFR calc non Af Amer 09/25/2015 >60  >60 mL/min Final  . GFR calc Af Amer 09/25/2015 >60  >60 mL/min Final   Comment: (NOTE) The eGFR has been calculated using the CKD EPI equation. This calculation has not been validated in all clinical situations. eGFR's persistently <60 mL/min signify possible Chronic Kidney Disease.   . Anion gap 09/25/2015 8  5 - 15 Final  . WBC 09/26/2015 29.9* 4.0 - 10.5 K/uL Final  . RBC 09/26/2015 4.34  4.22 - 5.81 MIL/uL Final  . Hemoglobin 09/26/2015 13.1  13.0 - 17.0 g/dL Final  . HCT 09/26/2015 41.4  39.0 - 52.0 % Final  . MCV 09/26/2015 95.4  78.0 - 100.0 fL Final  . MCH 09/26/2015 30.2  26.0 - 34.0 pg Final  . MCHC 09/26/2015 31.6  30.0 - 36.0 g/dL Final  . RDW 09/26/2015 12.6  11.5 - 15.5 % Final  . Platelets 09/26/2015 310  150 - 400 K/uL Final  . Sodium 09/26/2015 137  135 - 145 mmol/L  Final  . Potassium 09/26/2015 4.9  3.5 - 5.1 mmol/L Final  . Chloride 09/26/2015 102  101 - 111 mmol/L Final  . CO2 09/26/2015 29  22 - 32 mmol/L Final  . Glucose, Bld 09/26/2015 143* 65 - 99 mg/dL Final  . BUN 09/26/2015 14  6 - 20 mg/dL Final  . Creatinine, Ser 09/26/2015 0.89  0.61 - 1.24 mg/dL Final  . Calcium 09/26/2015 8.9  8.9 - 10.3 mg/dL Final  . GFR calc non Af Amer 09/26/2015 >60  >60 mL/min Final  . GFR calc Af Amer 09/26/2015 >60  >60 mL/min Final   Comment: (NOTE) The eGFR has been calculated using the CKD EPI equation. This calculation has not been validated in all clinical situations. eGFR's persistently <60 mL/min signify possible Chronic Kidney Disease.   . Anion gap 09/26/2015 6  5 - 15 Final  . Glucose-Capillary 09/25/2015 248* 65 - 99 mg/dL Final  . Glucose-Capillary 09/25/2015 205* 65 - 99 mg/dL Final  . Comment 1  09/25/2015 Notify RN   Final  . Neutrophils Relative % 09/26/2015 88  % Final  . Neutro Abs 09/26/2015 26.6* 1.7 - 7.7 K/uL Final  . Lymphocytes Relative 09/26/2015 4  % Final  . Lymphs Abs 09/26/2015 1.1  0.7 - 4.0 K/uL Final  . Monocytes Relative 09/26/2015 8  % Final  . Monocytes Absolute 09/26/2015 2.5* 0.1 - 1.0 K/uL Final  . Eosinophils Relative 09/26/2015 0  % Final  . Eosinophils Absolute 09/26/2015 0.0  0.0 - 0.7 K/uL Final  . Basophils Relative 09/26/2015 0  % Final  . Basophils Absolute 09/26/2015 0.0  0.0 - 0.1 K/uL Final  . Color, Urine 09/26/2015 YELLOW  YELLOW Final  . APPearance 09/26/2015 CLOUDY* CLEAR Final  . Specific Gravity, Urine 09/26/2015 1.013  1.005 - 1.030 Final  . pH 09/26/2015 7.0  5.0 - 8.0 Final  . Glucose, UA 09/26/2015 NEGATIVE  NEGATIVE mg/dL Final  . Hgb urine dipstick 09/26/2015 NEGATIVE  NEGATIVE Final  . Bilirubin Urine 09/26/2015 NEGATIVE  NEGATIVE Final  . Ketones, ur 09/26/2015 NEGATIVE  NEGATIVE mg/dL Final  . Protein, ur 09/26/2015 NEGATIVE  NEGATIVE mg/dL Final  . Nitrite 09/26/2015 NEGATIVE  NEGATIVE Final  . Leukocytes, UA 09/26/2015 NEGATIVE  NEGATIVE Final  . WBC 09/27/2015 24.0* 4.0 - 10.5 K/uL Final  . RBC 09/27/2015 4.30  4.22 - 5.81 MIL/uL Final  . Hemoglobin 09/27/2015 13.0  13.0 - 17.0 g/dL Final  . HCT 09/27/2015 40.4  39.0 - 52.0 % Final  . MCV 09/27/2015 94.0  78.0 - 100.0 fL Final  . MCH 09/27/2015 30.2  26.0 - 34.0 pg Final  . MCHC 09/27/2015 32.2  30.0 - 36.0 g/dL Final  . RDW 09/27/2015 13.1  11.5 - 15.5 % Final  . Platelets 09/27/2015 320  150 - 400 K/uL Final  Hospital Outpatient Visit on 09/15/2015  Component Date Value Ref Range Status  . MRSA, PCR 09/15/2015 NEGATIVE  NEGATIVE Final  . Staphylococcus aureus 09/15/2015 NEGATIVE  NEGATIVE Final   Comment:        The Xpert SA Assay (FDA approved for NASAL specimens in patients over 41 years of age), is one component of a comprehensive surveillance program.  Test  performance has been validated by [facility] for patients greater than or equal to 36 year old. It is not intended to diagnose infection nor to guide or monitor treatment.   Marland Kitchen aPTT 09/15/2015 33  24 - 37 seconds Final  . WBC 09/15/2015  9.1  4.0 - 10.5 K/uL Final  . RBC 09/15/2015 4.63  4.22 - 5.81 MIL/uL Final  . Hemoglobin 09/15/2015 14.4  13.0 - 17.0 g/dL Final  . HCT 09/15/2015 42.6  39.0 - 52.0 % Final  . MCV 09/15/2015 92.0  78.0 - 100.0 fL Final  . MCH 09/15/2015 31.1  26.0 - 34.0 pg Final  . MCHC 09/15/2015 33.8  30.0 - 36.0 g/dL Final  . RDW 09/15/2015 12.4  11.5 - 15.5 % Final  . Platelets 09/15/2015 273  150 - 400 K/uL Final  . Sodium 09/15/2015 134* 135 - 145 mmol/L Final  . Potassium 09/15/2015 3.8  3.5 - 5.1 mmol/L Final  . Chloride 09/15/2015 101  101 - 111 mmol/L Final  . CO2 09/15/2015 27  22 - 32 mmol/L Final  . Glucose, Bld 09/15/2015 108* 65 - 99 mg/dL Final  . BUN 09/15/2015 14  6 - 20 mg/dL Final  . Creatinine, Ser 09/15/2015 1.06  0.61 - 1.24 mg/dL Final  . Calcium 09/15/2015 8.6* 8.9 - 10.3 mg/dL Final  . Total Protein 09/15/2015 7.5  6.5 - 8.1 g/dL Final  . Albumin 09/15/2015 4.1  3.5 - 5.0 g/dL Final  . AST 09/15/2015 16  15 - 41 U/L Final  . ALT 09/15/2015 16* 17 - 63 U/L Final  . Alkaline Phosphatase 09/15/2015 88  38 - 126 U/L Final  . Total Bilirubin 09/15/2015 0.1* 0.3 - 1.2 mg/dL Final  . GFR calc non Af Amer 09/15/2015 >60  >60 mL/min Final  . GFR calc Af Amer 09/15/2015 >60  >60 mL/min Final   Comment: (NOTE) The eGFR has been calculated using the CKD EPI equation. This calculation has not been validated in all clinical situations. eGFR's persistently <60 mL/min signify possible Chronic Kidney Disease.   . Anion gap 09/15/2015 6  5 - 15 Final  . Prothrombin Time 09/15/2015 13.0  11.6 - 15.2 seconds Final  . INR 09/15/2015 1.00  0.00 - 1.49 Final  . ABO/RH(D) 09/24/2015 O POS   Final  . Antibody Screen 09/24/2015 NEG   Final  . Sample  Expiration 09/24/2015 09/27/2015   Final  . Extend sample reason 09/24/2015 NO TRANSFUSIONS OR PREGNANCY IN THE PAST 3 MONTHS   Final  . Color, Urine 09/15/2015 YELLOW  YELLOW Final  . APPearance 09/15/2015 CLEAR  CLEAR Final  . Specific Gravity, Urine 09/15/2015 1.030  1.005 - 1.030 Final  . pH 09/15/2015 5.5  5.0 - 8.0 Final  . Glucose, UA 09/15/2015 NEGATIVE  NEGATIVE mg/dL Final  . Hgb urine dipstick 09/15/2015 NEGATIVE  NEGATIVE Final  . Bilirubin Urine 09/15/2015 NEGATIVE  NEGATIVE Final  . Ketones, ur 09/15/2015 NEGATIVE  NEGATIVE mg/dL Final  . Protein, ur 09/15/2015 NEGATIVE  NEGATIVE mg/dL Final  . Nitrite 09/15/2015 NEGATIVE  NEGATIVE Final  . Leukocytes, UA 09/15/2015 NEGATIVE  NEGATIVE Final  . ABO/RH(D) 09/15/2015 O POS   Final  Admission on 08/26/2015, Discharged on 08/26/2015  Component Date Value Ref Range Status  . WBC 08/26/2015 9.2  4.0 - 10.5 K/uL Final  . RBC 08/26/2015 5.17  4.22 - 5.81 MIL/uL Final  . Hemoglobin 08/26/2015 16.1  13.0 - 17.0 g/dL Final  . HCT 08/26/2015 47.1  39.0 - 52.0 % Final  . MCV 08/26/2015 91.1  78.0 - 100.0 fL Final  . MCH 08/26/2015 31.1  26.0 - 34.0 pg Final  . MCHC 08/26/2015 34.2  30.0 - 36.0 g/dL Final  . RDW 08/26/2015 11.8  11.5 -  15.5 % Final  . Platelets 08/26/2015 325  150 - 400 K/uL Final  . Neutrophils Relative % 08/26/2015 72  % Final  . Neutro Abs 08/26/2015 6.6  1.7 - 7.7 K/uL Final  . Lymphocytes Relative 08/26/2015 16  % Final  . Lymphs Abs 08/26/2015 1.5  0.7 - 4.0 K/uL Final  . Monocytes Relative 08/26/2015 9  % Final  . Monocytes Absolute 08/26/2015 0.9  0.1 - 1.0 K/uL Final  . Eosinophils Relative 08/26/2015 3  % Final  . Eosinophils Absolute 08/26/2015 0.3  0.0 - 0.7 K/uL Final  . Basophils Relative 08/26/2015 0  % Final  . Basophils Absolute 08/26/2015 0.0  0.0 - 0.1 K/uL Final  . Sodium 08/26/2015 138  135 - 145 mmol/L Final  . Potassium 08/26/2015 4.0  3.5 - 5.1 mmol/L Final  . Chloride 08/26/2015 104  101 -  111 mmol/L Final  . CO2 08/26/2015 27  22 - 32 mmol/L Final  . Glucose, Bld 08/26/2015 93  65 - 99 mg/dL Final  . BUN 08/26/2015 9  6 - 20 mg/dL Final  . Creatinine, Ser 08/26/2015 0.74  0.61 - 1.24 mg/dL Final  . Calcium 08/26/2015 9.0  8.9 - 10.3 mg/dL Final  . Total Protein 08/26/2015 7.6  6.5 - 8.1 g/dL Final  . Albumin 08/26/2015 4.3  3.5 - 5.0 g/dL Final  . AST 08/26/2015 14* 15 - 41 U/L Final  . ALT 08/26/2015 18  17 - 63 U/L Final  . Alkaline Phosphatase 08/26/2015 90  38 - 126 U/L Final  . Total Bilirubin 08/26/2015 0.8  0.3 - 1.2 mg/dL Final  . GFR calc non Af Amer 08/26/2015 >60  >60 mL/min Final  . GFR calc Af Amer 08/26/2015 >60  >60 mL/min Final   Comment: (NOTE) The eGFR has been calculated using the CKD EPI equation. This calculation has not been validated in all clinical situations. eGFR's persistently <60 mL/min signify possible Chronic Kidney Disease.   . Anion gap 08/26/2015 7  5 - 15 Final  . Troponin I 08/26/2015 <0.03  <0.031 ng/mL Final   Comment:        NO INDICATION OF MYOCARDIAL INJURY.      X-Rays:Dg Chest 2 View  Result Date: 09/26/2015 CLINICAL DATA:  Cough. EXAM: CHEST  2 VIEW COMPARISON:  Multiple chest x-rays since May 22, 2014 FINDINGS: Emphysematous changes and scarring are seen in both lungs, unchanged. Slightly more focal opacity in the lateral left lung is unchanged. No pulmonary nodules or masses. No change in the cardiomediastinal silhouette. IMPRESSION: No interval change.  Chronic emphysematous changes and scarring. Electronically Signed   By: Dorise Bullion III M.D   On: 09/26/2015 09:59  Dg Pelvis Portable  Result Date: 09/24/2015 CLINICAL DATA:  Status post total hip arthroplasty anterior approach. History of osteoporosis. EXAM: DG C-ARM 1-60 MIN-NO REPORT; PORTABLE PELVIS 1-2 VIEWS COMPARISON:  None. FINDINGS: Patient is status post bilateral hip arthroplasties. Hardware appears intact and appropriately positioned bilaterally. No  evidence of a surgical complicating feature. Adjacent osseous structures appear intact and anatomic in alignment. Expected surgical changes seen within the soft tissues adjacent to the right hip, with drain in place. IMPRESSION: Status post right hip arthroplasty. Expected postsurgical changes within the adjacent soft tissues, with drain in place. The new prosthetic hardware appears intact and appropriately positioned. Left hip arthroplasty hardware also remains intact and appropriately positioned. Electronically Signed   By: Franki Cabot M.D.   On: 09/24/2015 15:43   Dg  C-arm 1-60 Min-no Report  Result Date: 09/24/2015 CLINICAL DATA: SURGERY C-ARM 1-60 MINUTES Fluoroscopy was utilized by the requesting physician.  No radiographic interpretation.   Dg Hip Port Unilat With Pelvis 1v Right  Result Date: 09/26/2015 CLINICAL DATA:  Initial encounter for Pain and burning sensation on right hip after physical therapy today; recent hip surgery on 09/24/2015 ; pt id checked with RN EXAM: DG HIP (WITH OR WITHOUT PELVIS) 1V PORT RIGHT COMPARISON:  09/24/2015 FINDINGS: Bilateral hip arthroplasties. No dislocation or periprosthetic fracture. Residual soft tissue fullness and gas about the right hip. IMPRESSION: Stable appearance of bilateral hip arthroplasties, without acute complication. Electronically Signed   By: Abigail Miyamoto M.D.   On: 09/26/2015 11:00   EKG: Orders placed or performed during the hospital encounter of 08/26/15  . ED EKG  . ED EKG  . EKG     Hospital Course: Patient was admitted to [facility] and taken to the OR and underwent the above state procedure without complications.  Patient tolerated the procedure well and was later transferred to the recovery room and then to the orthopaedic floor for postoperative care.  They were given PO and IV analgesics for pain control following their surgery.  They were given 24 hours of postoperative antibiotics of  Anti-infectives    Start      Dose/Rate Route Frequency Ordered Stop   09/24/15 2000  ceFAZolin (ANCEF) IVPB 2g/100 mL premix     2 g 200 mL/hr over 30 Minutes Intravenous Every 6 hours 09/24/15 1717 09/25/15 0206   09/24/15 1053  ceFAZolin (ANCEF) IVPB 2g/100 mL premix     2 g 200 mL/hr over 30 Minutes Intravenous On call to O.R. 09/24/15 1053 09/24/15 1332     and started on DVT prophylaxis in the form of Xarelto.   PT and OT were ordered for total hip protocol.  The patient was allowed to be WBAT with therapy. Discharge planning was consulted to help with postop disposition and equipment needs.  Patient had a tough night on the evening of surgery.  They started to get up OOB with therapy on day one.  Hemovac drain was pulled without difficulty.  Continued to work with therapy into day two.  Dressing was changed on day two and the incision was healing well.  Leukocytosis steroids vs pneumonia. Medicine consult. History of Present Illness: Wesley [name] is an 50 y.o. male with past medical history of chronic pain, COPD and tobacco abuse. Patient admitted electively to the hospital for right hip arthroplasty. He developed leukocytosis so [facility] hospitalist was consulted for further evaluation. He was recently in [facility] admitted for COPD exacerbation and was discharged on antibiotics and prednisone taper. Upon admission to the hospital for hip arthroplasty was placed on higher dose of prednisone. He denies any fever, denies any chills, he does have some sputum production which is probably normal with someone with COPD. No more wheezing or SOB. Leukocytosis -This is likely secondary to steroids and not related to infection, no fever or chills or symptoms of infection. -CXR did not show acute findings, chronic emphysematous changes. He does not have no symptoms. -He was treated last week with antibiotics for COPD exacerbation. -I'll add incentive spirometry, Mucinex and ambulate as soon as it appropriate. Obtain  urinalysis -I do not recommend repeating antibiotics there is no evidence of infection, discontinue prednisone.  By day three, the patient had progressed with therapy and meeting their goals.  Incision was healing well.  Patient was seen in rounds and was ready to go home. Patient seen on rounds on day three and was ready to go home.  Discharge home with home health Diet - Regular diet Follow up - in 2 weeks Activity - WBAT Disposition - Home Condition Upon Discharge - Good D/C Meds - See DC Summary DVT Prophylaxis - Xarelto    Medication List    STOP taking these medications   amoxicillin-clavulanate 875-125 MG tablet Commonly known as:  AUGMENTIN   benzonatate 100 MG capsule Commonly known as:  TESSALON   dextromethorphan-guaiFENesin 30-600 MG 12hr tablet Commonly known as:  MUCINEX DM     TAKE these medications   ADVAIR DISKUS 250-50 MCG/DOSE Aepb Generic drug:  Fluticasone-Salmeterol Inhale 1 puff into the lungs 2 (two) times daily.   albuterol 108 (90 Base) MCG/ACT inhaler Commonly known as:  PROVENTIL HFA;VENTOLIN HFA Inhale 2 puffs into the lungs every 4 (four) hours as needed for wheezing or shortness of breath. Shortness of breath   albuterol (2.5 MG/3ML) 0.083% nebulizer solution Commonly known as:  PROVENTIL Take 3 mLs (2.5 mg total) by nebulization every 6 (six) hours as needed for wheezing or shortness of breath.   budesonide-formoterol 160-4.5 MCG/ACT inhaler Commonly known as:  SYMBICORT Inhale 3 puffs into the lungs 4 (four) times daily.   furosemide 40 MG tablet Commonly known as:  LASIX Take 40 mg by mouth daily.   gabapentin 300 MG capsule Commonly known as:  NEURONTIN Take 600 mg by mouth 3 (three) times daily.   methocarbamol 500 MG tablet Commonly known as:  ROBAXIN Take 1 tablet (500 mg total) by mouth every 6 (six) hours as needed for muscle spasms.   morphine 30 MG 12 hr tablet Commonly known as:  MS CONTIN Take 30 mg by mouth every  8 (eight) hours.   NARCAN 4 MG/0.1ML Liqd Generic drug:  naloxone HCl Place 4 mg into the nose as directed. To prevent overdose   ondansetron 4 MG disintegrating tablet Commonly known as:  ZOFRAN ODT Take 1 tablet (4 mg total) by mouth every 8 (eight) hours as needed for nausea or vomiting.   ondansetron 4 MG disintegrating tablet Commonly known as:  ZOFRAN-ODT Take 4 mg by mouth every 8 (eight) hours as needed. nausea   oxycodone 30 MG immediate release tablet Commonly known as:  ROXICODONE Take 30 mg by mouth every 4 (four) hours as needed for pain. What changed:  Another medication with the same name was added. Make sure you understand how and when to take each.   Oxycodone HCl 20 MG Tabs Take 1-2 tablets (20-40 mg total) by mouth every 4 (four) hours as needed for moderate pain, severe pain or breakthrough pain. What changed:  You were already taking a medication with the same name, and this prescription was added. Make sure you understand how and when to take each.   predniSONE 20 MG tablet Commonly known as:  DELTASONE Take 2 tablets (40 mg total) by mouth daily. What changed:  Another medication with the same name was removed. Continue taking this medication, and follow the directions you see here.   rivaroxaban 10 MG Tabs tablet Commonly known as:  XARELTO Take 1 tablet (10 mg total) by mouth daily with breakfast.   SYSTANE 0.4-0.3 % Soln Generic drug:  Polyethyl Glycol-Propyl Glycol Place 1-2 drops into the left eye daily as needed (for dry eye relief).   tiotropium 18 MCG inhalation capsule Commonly known as:  [place]  1 capsule (18 mcg total) into inhaler and inhale daily.   traMADol 50 MG tablet Commonly known as:  ULTRAM Take 1-2 tablets (50-100 mg total) by mouth every 6 (six) hours as needed for moderate pain.      Follow-up Information    Gearlean Alf, MD. Schedule an appointment as soon as possible for a visit on 10/07/2015.   Specialty:   Orthopedic Surgery Why:  Call [phone] Monday to make the appointment Contact information: [address] [place] [address] [phone]        [facility] .   Why:  home health physical therapy Contact information: [address] [place] [address] [phone]           Signed: Arlee Muslim, PA-C Orthopaedic Surgery 09/27/2015, 7:17 AM

## 2015-09-27 NOTE — Discharge Instructions (Addendum)
Dr. Gaynelle Arabian Total Joint Specialist [facility] [address], [place], [place] [address] [phone]  ANTERIOR APPROACH TOTAL HIP REPLACEMENT POSTOPERATIVE DIRECTIONS   Hip Rehabilitation, Guidelines Following Surgery  The results of a hip operation are greatly improved after range of motion and muscle strengthening exercises. Follow all safety measures which are given to protect your hip. If any of these exercises cause increased pain or swelling in your joint, decrease the amount until you are comfortable again. Then slowly increase the exercises. Call your caregiver if you have problems or questions.   HOME CARE INSTRUCTIONS  Remove items at home which could result in a fall. This includes throw rugs or furniture in walking pathways.   ICE to the affected hip every three hours for 30 minutes at a time and then as needed for pain and swelling.  Continue to use ice on the hip for pain and swelling from surgery. You may notice swelling that will progress down to the foot and ankle.  This is normal after surgery.  Elevate the leg when you are not up walking on it.    Continue to use the breathing machine which will help keep your temperature down.  It is common for your temperature to cycle up and down following surgery, especially at night when you are not up moving around and exerting yourself.  The breathing machine keeps your lungs expanded and your temperature down.   DIET You may resume your previous home diet once your are discharged from the hospital.  DRESSING / WOUND CARE / SHOWERING You may start showering once you are discharged home but do not submerge the incision under water. Just pat the incision dry and apply a dry gauze dressing on daily. Change the surgical dressing daily and reapply a dry dressing each time.  ACTIVITY Walk with your walker as instructed. Use walker as long as suggested by your caregivers. Avoid periods of inactivity  such as sitting longer than an hour when not asleep. This helps prevent blood clots.  You may resume a sexual relationship in one month or when given the OK by your doctor.  You may return to work once you are cleared by your doctor.  Do not drive a car for 6 weeks or until released by you surgeon.  Do not drive while taking narcotics.  WEIGHT BEARING Weight bearing as tolerated with assist device (walker, cane, etc) as directed, use it as long as suggested by your surgeon or therapist, typically at least 4-6 weeks.  POSTOPERATIVE CONSTIPATION PROTOCOL Constipation - defined medically as fewer than three stools per week and severe constipation as less than one stool per week.  One of the most common issues patients have following surgery is constipation.  Even if you have a regular bowel pattern at home, your normal regimen is likely to be disrupted due to multiple reasons following surgery.  Combination of anesthesia, postoperative narcotics, change in appetite and fluid intake all can affect your bowels.  In order to avoid complications following surgery, here are some recommendations in order to help you during your recovery period.  Colace (docusate) - Pick up an over-the-counter form of Colace or another stool softener and take twice a day as long as you are requiring postoperative pain medications.  Take with a full glass of water daily.  If you experience loose stools or diarrhea, hold the colace until you stool forms back up.  If your symptoms do not get better within 1  week or if they get worse, check with your doctor. ° °Dulcolax (bisacodyl) - Pick up over-the-counter and take as directed by the product packaging as needed to assist with the movement of your bowels.  Take with a full glass of water.  Use this product as needed if not relieved by Colace only.  ° °MiraLax (polyethylene glycol) - Pick up over-the-counter to have on hand.  MiraLax is a solution that will increase the amount of  water in your bowels to assist with bowel movements.  Take as directed and can mix with a glass of water, juice, soda, coffee, or tea.  Take if you go more than two days without a movement. °Do not use MiraLax more than once per day. Call your doctor if you are still constipated or irregular after using this medication for 7 days in a row. ° °If you continue to have problems with postoperative constipation, please contact the office for further assistance and recommendations.  If you experience "the worst abdominal pain ever" or develop nausea or vomiting, please contact the office immediatly for further recommendations for treatment. ° °ITCHING ° If you experience itching with your medications, try taking only a single pain pill, or even half a pain pill at a time.  You can also use Benadryl over the counter for itching or also to help with sleep.  ° °TED HOSE STOCKINGS °Wear the elastic stockings on both legs for three weeks following surgery during the day but you may remove then at night for sleeping. ° °MEDICATIONS °See your medication summary on the “After Visit Summary” that the nursing staff will review with you prior to discharge.  You may have some home medications which will be placed on hold until you complete the course of blood thinner medication.  It is important for you to complete the blood thinner medication as prescribed by your surgeon.  Continue your approved medications as instructed at time of discharge. ° °PRECAUTIONS °If you experience chest pain or shortness of breath - call 911 immediately for transfer to the hospital emergency department.  °If you develop a fever greater that 101 F, purulent drainage from wound, increased redness or drainage from wound, foul odor from the wound/dressing, or calf pain - CONTACT YOUR SURGEON.   °                                                °FOLLOW-UP APPOINTMENTS °Make sure you keep all of your appointments after your operation with your surgeon and  caregivers. You should call the office at the above phone number and make an appointment for approximately two weeks after the date of your surgery or on the date instructed by your surgeon outlined in the "After Visit Summary". ° °RANGE OF MOTION AND STRENGTHENING EXERCISES  °These exercises are designed to help you keep full movement of your hip joint. Follow your caregiver's or physical therapist's instructions. Perform all exercises about fifteen times, three times per day or as directed. Exercise both hips, even if you have had only one joint replacement. These exercises can be done on a training (exercise) mat, on the floor, on a table or on a bed. Use whatever works the best and is most comfortable for you. Use music or television while you are exercising so that the exercises are a pleasant break in your day. This   will make your life better with the exercises acting as a break in routine you can look forward to.  Lying on your back, slowly slide your foot toward your buttocks, raising your knee up off the floor. Then slowly slide your foot back down until your leg is straight again.  Lying on your back spread your legs as far apart as you can without causing discomfort.  Lying on your side, raise your upper leg and foot straight up from the floor as far as is comfortable. Slowly lower the leg and repeat.  Lying on your back, tighten up the muscle in the front of your thigh (quadriceps muscles). You can do this by keeping your leg straight and trying to raise your heel off the floor. This helps strengthen the largest muscle supporting your knee.  Lying on your back, tighten up the muscles of your buttocks both with the legs straight and with the knee bent at a comfortable angle while keeping your heel on the floor.   IF YOU ARE TRANSFERRED TO A SKILLED REHAB FACILITY If the patient is transferred to a skilled rehab facility following release from the hospital, a list of the current medications will be  sent to the facility for the patient to continue.  When discharged from the skilled rehab facility, please have the facility set up the patient's [place] prior to being released. Also, the skilled facility will be responsible for providing the patient with their medications at time of release from the facility to include their pain medication, the muscle relaxants, and their blood thinner medication. If the patient is still at the rehab facility at time of the two week follow up appointment, the skilled rehab facility will also need to assist the patient in arranging follow up appointment in our office and any transportation needs.  MAKE SURE YOU:  Understand these instructions.  Get help right away if you are not doing well or get worse.    Pick up stool softner and laxative for home use following surgery while on pain medications. Do not submerge incision under water. Please use good hand washing techniques while changing dressing each day. May shower starting three days after surgery. Please use a clean towel to pat the incision dry following showers. Continue to use ice for pain and swelling after surgery. Do not use any lotions or creams on the incision until instructed by your surgeon.  Information on my medicine - XARELTO (Rivaroxaban)  This medication education was reviewed with me or my healthcare representative as part of my discharge preparation.  The pharmacist that spoke with me during my hospital stay was:  Lolita Patella, [facility]  Why was Xarelto prescribed for you? Xarelto was prescribed for you to reduce the risk of blood clots forming after orthopedic surgery. The medical term for these abnormal blood clots is venous thromboembolism (VTE).  What do you need to know about xarelto ? Take your Xarelto ONCE DAILY at the same time every day. You may take it either with or without food.  If you have difficulty swallowing the tablet whole, you may  crush it and mix in applesauce just prior to taking your dose.  Take Xarelto exactly as prescribed by your doctor and DO NOT stop taking Xarelto without talking to the doctor who prescribed the medication.  Stopping without other VTE prevention medication to take the place of Xarelto may increase your risk of developing a clot.  After discharge, you should have regular  check-up appointments with your healthcare provider that is prescribing your Xarelto.    What do you do if you miss a dose? If you miss a dose, take it as soon as you remember on the same day then continue your regularly scheduled once daily regimen the next day. Do not take two doses of Xarelto on the same day.   Important Safety Information A possible side effect of Xarelto is bleeding. You should call your healthcare provider right away if you experience any of the following: ? Bleeding from an injury or your nose that does not stop. ? Unusual colored urine (red or dark brown) or unusual colored stools (red or black). ? Unusual bruising for unknown reasons. ? A serious fall or if you hit your head (even if there is no bleeding).  Some medicines may interact with Xarelto and might increase your risk of bleeding while on Xarelto. To help avoid this, consult your healthcare provider or pharmacist prior to using any new prescription or non-prescription medications, including herbals, vitamins, non-steroidal anti-inflammatory drugs (NSAIDs) and supplements.  This website has more information on Xarelto: [email or web address]

## 2015-09-27 NOTE — Progress Notes (Signed)
Spoke with patient at bedside, he states he was active with [facility] for nursing and would like to use them for PT as well, contacted [facility] for referral and also to deliver RW.

## 2015-09-27 NOTE — Progress Notes (Signed)
   Subjective: 3 Days Post-Op Procedure(s) (LRB): TOTAL HIP ARTHROPLASTY ANTERIOR APPROACH (Right) Patient reports pain as mild.   Patient seen in rounds by Dr. Wynelle Link. Patient is well, and has had no acute complaints or problems Patient is ready to go home  Objective: Vital signs in last 24 hours: Temp:  [98.1 F (36.7 C)-98.8 F (37.1 C)] 98.1 F (36.7 C) (07/24 0541) Pulse Rate:  [86-98] 86 (07/24 0541) Resp:  [16-18] 18 (07/24 0541) BP: (116-131)/(63-76) 116/65 (07/24 0541) SpO2:  [92 %-98 %] 98 % (07/24 0541)  Intake/Output from previous day:  Intake/Output Summary (Last 24 hours) at 09/27/15 0714 Last data filed at 09/27/15 0542  Gross per 24 hour  Intake             2280 ml  Output             3125 ml  Net             -845 ml    Intake/Output this shift: No intake/output data recorded.  Labs:  Recent Labs  09/25/15 0508 09/26/15 0440 09/27/15 0405  HGB 13.8 13.1 13.0    Recent Labs  09/26/15 0440 09/27/15 0405  WBC 29.9* 24.0*  RBC 4.34 4.30  HCT 41.4 40.4  PLT 310 320    Recent Labs  09/25/15 0508 09/26/15 0440  NA 134* 137  K 4.6 4.9  CL 101 102  CO2 25 29  BUN 15 14  CREATININE 0.87 0.89  GLUCOSE 203* 143*  CALCIUM 8.7* 8.9   No results for input(s): LABPT, INR in the last 72 hours.  EXAM: General - Patient is Alert and Appropriate Extremity - Neurovascular intact Sensation intact distally Incision - clean, dry, no drainage Motor Function - intact, moving foot and toes well on exam.   Assessment/Plan: 3 Days Post-Op Procedure(s) (LRB): TOTAL HIP ARTHROPLASTY ANTERIOR APPROACH (Right) Procedure(s) (LRB): TOTAL HIP ARTHROPLASTY ANTERIOR APPROACH (Right) Past Medical History:  Diagnosis Date  . Avascular necrosis of femur ([place])   . Back pain   . COPD (chronic obstructive pulmonary disease) ([place])   . Neuropathy ([place])   . Osteoporosis   . Pulmonary asbestosis ([place])   . 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   Weight as of this encounter: 82.9 kg (182 lb 12.8 oz). Up with therapy Discharge home with home health Diet - Regular diet Follow up - in 2 weeks Activity - WBAT Disposition - Home Condition Upon Discharge - Good D/C Meds - See DC Summary DVT Prophylaxis - Xarelto  Arlee Muslim, PA-C Orthopaedic Surgery 09/27/2015, 7:14 AM

## 2015-09-27 NOTE — Progress Notes (Signed)
PROGRESS NOTE  Wesley [name]  [id number] DOB: [birth year] DOA: 09/24/2015 PCP: [name], MD Outpatient Specialists:  Subjective: Feels much better, excited that he is going home today.  Brief Narrative:  50 year old with history of COPD, came in for elective hip surgery, developed leukocytosis during his hospital stay and [facility] consulted.  Assessment & Plan:   Principal Problem:   OA (osteoarthritis) of hip Active Problems:   Leukocytosis   COPD (chronic obstructive pulmonary disease) (HCC)   Chronic pain   Leukocytosis -Likely steroids induced, the steroids discontinued. -Decreased peak of 29.9, today is 24.0 without any symptoms. -Recommend no further workup for leukocytosis, no antibiotics recommended at this point.   DVT prophylaxis:  Code Status: Full Code Family Communication:  Disposition Plan:  Diet: Diet regular Room service appropriate?: Yes; Fluid consistency:: Thin  Consultants:   None  Procedures:   Hip arthroplasty  Antimicrobials:   Perioperative cefazolin   Objective: Vitals:   09/26/15 1944 09/26/15 2121 09/27/15 0541 09/27/15 0747  BP:  131/63 116/65   Pulse:  90 86   Resp:  16 18   Temp:  98.6 F (37 C) 98.1 F (36.7 C)   TempSrc:  Oral Oral   SpO2: 93% 97% 98% 94%  Weight:      Height:        Intake/Output Summary (Last 24 hours) at 09/27/15 1058 Last data filed at 09/27/15 1055  Gross per 24 hour  Intake             2160 ml  Output             3075 ml  Net             -915 ml   Filed Weights   09/24/15 1120  Weight: 82.9 kg (182 lb 12.8 oz)    Examination: General exam: Appears calm and comfortable  Respiratory system: Clear to auscultation. Respiratory effort normal. Cardiovascular system: S1 & S2 heard, RRR. No JVD, murmurs, rubs, gallops or clicks. No pedal edema. Gastrointestinal system: Abdomen is nondistended, soft and nontender. No organomegaly or masses felt. Normal bowel sounds heard. Central  nervous system: Alert and oriented. No focal neurological deficits. Extremities: Symmetric 5 x 5 power. Skin: No rashes, lesions or ulcers Psychiatry: Judgement and insight appear normal. Mood & affect appropriate.   Data Reviewed: I have personally reviewed following labs and imaging studies  CBC:  Recent Labs Lab 09/25/15 0508 09/26/15 0440 09/27/15 0405  WBC 25.3* 29.9* 24.0*  NEUTROABS  --  26.6*  --   HGB 13.8 13.1 13.0  HCT 41.9 41.4 40.4  MCV 92.9 95.4 94.0  PLT 333 310 [id number]   Basic Metabolic Panel:  Recent Labs Lab 09/25/15 0508 09/26/15 0440  NA 134* 137  K 4.6 4.9  CL 101 102  CO2 25 29  GLUCOSE 203* 143*  BUN 15 14  CREATININE 0.87 0.89  CALCIUM 8.7* 8.9   GFR: Estimated Creatinine Clearance: 109.7 mL/min (by C-G formula based on SCr of 0.89 mg/dL). Liver Function Tests: No results for input(s): AST, ALT, ALKPHOS, BILITOT, PROT, ALBUMIN in the last 168 hours. No results for input(s): LIPASE, AMYLASE in the last 168 hours. No results for input(s): AMMONIA in the last 168 hours. Coagulation Profile: No results for input(s): INR, PROTIME in the last 168 hours. Cardiac Enzymes: No results for input(s): CKTOTAL, CKMB, CKMBINDEX, TROPONINI in the last 168 hours. BNP (last 3 results) No results for input(s): PROBNP in the last 8760  hours. HbA1C: No results for input(s): HGBA1C in the last 72 hours. CBG:  Recent Labs Lab 09/25/15 1845 09/25/15 2205  GLUCAP 248* 205*   Lipid Profile: No results for input(s): CHOL, HDL, LDLCALC, TRIG, CHOLHDL, LDLDIRECT in the last 72 hours. Thyroid Function Tests: No results for input(s): TSH, T4TOTAL, FREET4, T3FREE, THYROIDAB in the last 72 hours. Anemia Panel: No results for input(s): VITAMINB12, FOLATE, FERRITIN, TIBC, IRON, RETICCTPCT in the last 72 hours. Urine analysis:    Component Value Date/Time   COLORURINE YELLOW 09/26/2015 1041   APPEARANCEUR CLOUDY (A) 09/26/2015 1041   LABSPEC 1.013 09/26/2015 1041     PHURINE 7.0 09/26/2015 1041   GLUCOSEU NEGATIVE 09/26/2015 1041   HGBUR NEGATIVE 09/26/2015 1041   [place] 09/26/2015 1041   KETONESUR NEGATIVE 09/26/2015 1041   PROTEINUR NEGATIVE 09/26/2015 1041   UROBILINOGEN 0.2 05/23/2014 0102   NITRITE NEGATIVE 09/26/2015 1041   LEUKOCYTESUR NEGATIVE 09/26/2015 1041   Sepsis Labs: @LABRCNTIP (procalcitonin:4,lacticidven:4)  )No results found for this or any previous visit (from the past 240 hour(s)).   Invalid input(s): PROCALCITONIN, LACTICACIDVEN   Radiology Studies: Dg Chest 2 View  Result Date: 09/26/2015 CLINICAL DATA:  Cough. EXAM: CHEST  2 VIEW COMPARISON:  Multiple chest x-rays since May 22, 2014 FINDINGS: Emphysematous changes and scarring are seen in both lungs, unchanged. Slightly more focal opacity in the lateral left lung is unchanged. No pulmonary nodules or masses. No change in the cardiomediastinal silhouette. IMPRESSION: No interval change.  Chronic emphysematous changes and scarring. Electronically Signed   By: Dorise Bullion III M.D   On: 09/26/2015 09:59  Dg Hip Port Unilat With Pelvis 1v Right  Result Date: 09/26/2015 CLINICAL DATA:  Initial encounter for Pain and burning sensation on right hip after physical therapy today; recent hip surgery on 09/24/2015 ; pt id checked with RN EXAM: DG HIP (WITH OR WITHOUT PELVIS) 1V PORT RIGHT COMPARISON:  09/24/2015 FINDINGS: Bilateral hip arthroplasties. No dislocation or periprosthetic fracture. Residual soft tissue fullness and gas about the right hip. IMPRESSION: Stable appearance of bilateral hip arthroplasties, without acute complication. Electronically Signed   By: Abigail Miyamoto M.D.   On: 09/26/2015 11:00       Scheduled Meds: . albuterol  3 mL Inhalation QID  . docusate sodium  100 mg Oral BID  . furosemide  40 mg Oral Daily  . gabapentin  600 mg Oral TID  . guaiFENesin  600 mg Oral BID  . mometasone-formoterol  2 puff Inhalation BID  . morphine  30 mg  Oral Q8H  . nicotine  21 mg Transdermal Daily  . rivaroxaban  10 mg Oral Q breakfast  . tiotropium  18 mcg Inhalation Daily   Continuous Infusions: . sodium chloride 100 mL/hr at 09/24/15 1641     LOS: 3 days    Time spent: 35 minutes    Desere Gwin A, MD [facility] Pager [phone]  If 7PM-7AM, please contact night-coverage [email or web address] Password [facility] 09/27/2015, 10:58 AM

## 2015-10-08 ENCOUNTER — Ambulatory Visit (HOSPITAL_COMMUNITY)
Admission: RE | Admit: 2015-10-08 | Discharge: 2015-10-08 | Disposition: A | Discharge status: Home / Self Care | Payer: Medicare Other | Source: Ambulatory Visit | Attending: Cardiology | Admitting: Cardiology

## 2015-10-08 ENCOUNTER — Encounter (INDEPENDENT_AMBULATORY_CARE_PROVIDER_SITE_OTHER): Payer: Self-pay

## 2015-10-08 ENCOUNTER — Other Ambulatory Visit: Payer: Self-pay | Admitting: Orthopedic Surgery

## 2015-10-08 DIAGNOSIS — J449 Chronic obstructive pulmonary disease, unspecified: Secondary | ICD-10-CM | POA: Diagnosis not present

## 2015-10-08 DIAGNOSIS — M7989 Other specified soft tissue disorders: Principal | ICD-10-CM

## 2015-10-08 DIAGNOSIS — M79661 Pain in right lower leg: Secondary | ICD-10-CM

## 2015-10-15 ENCOUNTER — Emergency Department (HOSPITAL_COMMUNITY)
Admission: EM | Admit: 2015-10-15 | Discharge: 2015-10-15 | Disposition: A | Discharge status: Home / Self Care | Payer: Medicare Other | Attending: Emergency Medicine | Admitting: Emergency Medicine

## 2015-10-15 ENCOUNTER — Emergency Department (HOSPITAL_COMMUNITY): Payer: Medicare Other

## 2015-10-15 ENCOUNTER — Encounter (HOSPITAL_COMMUNITY): Payer: Self-pay | Admitting: Emergency Medicine

## 2015-10-15 DIAGNOSIS — Z87891 Personal history of nicotine dependence: Secondary | ICD-10-CM | POA: Diagnosis not present

## 2015-10-15 DIAGNOSIS — Z7982 Long term (current) use of aspirin: Secondary | ICD-10-CM | POA: Insufficient documentation

## 2015-10-15 DIAGNOSIS — J441 Chronic obstructive pulmonary disease with (acute) exacerbation: Secondary | ICD-10-CM | POA: Insufficient documentation

## 2015-10-15 DIAGNOSIS — Z79899 Other long term (current) drug therapy: Secondary | ICD-10-CM | POA: Diagnosis not present

## 2015-10-15 DIAGNOSIS — M25551 Pain in right hip: Secondary | ICD-10-CM | POA: Diagnosis not present

## 2015-10-15 MED ORDER — OXYCODONE HCL 5 MG PO TABS
20.0000 mg | ORAL_TABLET | Freq: Once | ORAL | Status: AC
  Administered 2015-10-15: 20 mg via ORAL
  Filled 2015-10-15: qty 4

## 2015-10-15 MED ORDER — OXYCODONE-ACETAMINOPHEN 5-325 MG PO TABS
2.0000 | ORAL_TABLET | Freq: Once | ORAL | Status: DC
  Filled 2015-10-15: qty 2

## 2015-10-15 MED ORDER — OXYCODONE HCL 20 MG PO TABS: 20.0000 mg | ORAL_TABLET | Freq: Three times a day (TID) | ORAL | 0 refills | Status: DC

## 2015-10-15 NOTE — ED Triage Notes (Addendum)
Pt complaint of right hip pain post fall 0630 today; recent total right hip replacement 7/21. Pt continues to report right hip "popping" since 8/2. Pt ruled out for clot; negative. Pt reports "popping" caused fall today.

## 2015-10-15 NOTE — ED Provider Notes (Addendum)
[facility] DEPT Provider Note   CSN: [id number] Arrival date & time: 10/15/15  1701  First Provider Contact:  First MD Initiated Contact with Patient 10/15/15 0530        History   Chief Complaint Chief Complaint  Patient presents with  . Hip Pain    HPI Wesley [name] is a 50 y.o. male.  He presents with hip pain. He had a total hip arthroplasty in July. He states occasionally it is "popping". When it does pop it causes him pain. This happened today at physical therapy and he fell. He is concerned that he injured the hip. He states that the therapist called [facility] and he states "I'm here to see Dr. Alvan Dame". He continues to be ambulatory on the leg.  HPI  Past Medical History:  Diagnosis Date  . Avascular necrosis of femur ([place])   . Back pain   . COPD (chronic obstructive pulmonary disease) ([place])   . Neuropathy ([place])   . Osteoporosis   . Pulmonary asbestosis ([place])   . 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 OA (osteoarthritis) of hip 09/24/2015  . CAP (community acquired pneumonia) 04/14/2015  . Vomiting and diarrhea   . Solitary pulmonary nodule 05/18/2014  . Nausea and vomiting 05/16/2014  . Bronchitis, acute, with bronchospasm 02/24/2014  . Osteoporosis, unspecified 08/06/2012  . Hip pain 08/06/2012  . Opiate abuse, continuous 01/05/2012  . Opiate dependence ([place]) 01/03/2012  . Chronic respiratory failure ([place]) 01/03/2012  . COPD exacerbation ([place]) 01/03/2012  . Pulmonary infection 01/03/2012  . Pneumonia 07/19/2011  . Fever 07/18/2011  . Myalgia 07/18/2011  . Leukocytosis 07/18/2011  . COPD (chronic obstructive pulmonary disease) ([place]) 07/18/2011  . Acute-on-chronic respiratory failure ([place]) 07/18/2011  . Tachycardia 07/18/2011  . Dehydration 07/18/2011  . Chronic pain 07/18/2011  . Tobacco abuse 07/18/2011    Past Surgical History:    Procedure Laterality Date  . BACK SURGERY    . EYE SURGERY     left eye cornea and lens replaced- blind in left eye  . JOINT REPLACEMENT    . LUNG SURGERY    . TOTAL HIP ARTHROPLASTY Right 09/24/2015   Procedure: TOTAL HIP ARTHROPLASTY ANTERIOR APPROACH;  Surgeon: Gaynelle Arabian, MD;  Location: [facility];  Service: Orthopedics;  Laterality: Right;       Home Medications    Prior to Admission medications   Medication Sig Start Date End Date Taking? Authorizing Provider  ADVAIR DISKUS 250-50 MCG/DOSE AEPB Inhale 1 puff into the lungs 2 (two) times daily. 06/14/15  Yes Historical Provider, MD  albuterol (PROVENTIL HFA;VENTOLIN HFA) 108 (90 BASE) MCG/ACT inhaler Inhale 2 puffs into the lungs every 4 (four) hours as needed for wheezing or shortness of breath. Shortness of breath 01/06/12  Yes Delfina Redwood, MD  albuterol (PROVENTIL) (2.5 MG/3ML) 0.083% nebulizer solution Take 3 mLs (2.5 mg total) by nebulization every 6 (six) hours as needed for wheezing or shortness of breath. 02/27/14  Yes Kathie Dike, MD  aspirin 325 MG tablet Take 325 mg by mouth daily.   Yes Historical Provider, MD  budesonide-formoterol (SYMBICORT) 160-4.5 MCG/ACT inhaler Inhale 3 puffs into the lungs 4 (four) times daily.   Yes Historical Provider, MD  furosemide (LASIX) 40 MG tablet Take 40 mg by mouth daily.   Yes Historical Provider, MD  gabapentin (NEURONTIN) 300 MG capsule  Take 600 mg by mouth 3 (three) times daily.    Yes Historical Provider, MD  methocarbamol (ROBAXIN) 500 MG tablet Take 1 tablet (500 mg total) by mouth every 6 (six) hours as needed for muscle spasms. 09/24/15  Yes Gaynelle Arabian, MD  morphine (MS CONTIN) 30 MG 12 hr tablet Take 30 mg by mouth every 8 (eight) hours. 08/04/15  Yes Historical Provider, MD  naloxone HCl (NARCAN) 4 MG/0.1ML LIQD Place 4 mg into the nose as directed. To prevent overdose   Yes Historical Provider, MD  ondansetron (ZOFRAN ODT) 4 MG disintegrating tablet Take 1 tablet (4  mg total) by mouth every 8 (eight) hours as needed for nausea or vomiting. 05/24/14  Yes Samuella Cota, MD  Polyethyl Glycol-Propyl Glycol (SYSTANE) 0.4-0.3 % SOLN Place 1-2 drops into the left eye daily as needed (for dry eye relief).    Yes Historical Provider, MD  predniSONE (DELTASONE) 20 MG tablet Take 2 tablets (40 mg total) by mouth daily. 08/26/15  Yes Virgel Manifold, MD  tiotropium (SPIRIVA) 18 MCG inhalation capsule Place 1 capsule (18 mcg total) into inhaler and inhale daily. 07/24/11  Yes Nimish Luther Parody, MD  Oxycodone HCl 20 MG TABS Take 1 tablet (20 mg total) by mouth 3 (three) times daily. 10/15/15   Tanna Furry, MD  rivaroxaban (XARELTO) 10 MG TABS tablet Take 1 tablet (10 mg total) by mouth daily with breakfast. Patient not taking: Reported on 10/15/2015 09/25/15   Gaynelle Arabian, MD  traMADol (ULTRAM) 50 MG tablet Take 1-2 tablets (50-100 mg total) by mouth every 6 (six) hours as needed for moderate pain. Patient not taking: Reported on 10/15/2015 09/24/15   Gaynelle Arabian, MD    Family History Family History  Problem Relation Age of Onset  . Heart failure Father   . Diabetes Father   . Diabetes Mother   . Cancer Mother     male cancer    Social History Social History  Substance Use Topics  . Smoking status: Former Smoker    Packs/day: 1.00    Quit date: 07/05/2011  . Smokeless tobacco: Not on file  . Alcohol use No     Allergies   Ambien [zolpidem tartrate]; Melatonin; and Zolpidem   Review of Systems Review of Systems  Constitutional: Negative for appetite change, chills, diaphoresis, fatigue and fever.  HENT: Negative for mouth sores, sore throat and trouble swallowing.   Eyes: Negative for visual disturbance.  Respiratory: Negative for cough, chest tightness, shortness of breath and wheezing.   Cardiovascular: Negative for chest pain.  Gastrointestinal: Negative for abdominal distention, abdominal pain, diarrhea, nausea and vomiting.  Endocrine: Negative for  polydipsia, polyphagia and polyuria.  Genitourinary: Negative for dysuria, frequency and hematuria.  Musculoskeletal: Positive for arthralgias. Negative for gait problem.       Right hip pain. Popping sound".  Skin: Negative for color change, pallor and rash.  Neurological: Negative for dizziness, syncope, light-headedness and headaches.  Hematological: Does not bruise/bleed easily.  Psychiatric/Behavioral: Negative for behavioral problems and confusion.     Physical Exam Updated Vital Signs BP (!) 114/54 (BP Location: Right Arm)   Pulse 75   Temp 97.9 F (36.6 C) (Oral)   Resp 20   Ht 6\' 1"  (1.854 m)   Wt 182 lb (82.6 kg)   SpO2 96%   BMI 24.01 kg/m   Physical Exam  Constitutional: He is oriented to person, place, and time. He appears well-developed and well-nourished. No distress.  HENT:  Head:  Normocephalic.  Eyes: Conjunctivae are normal. Pupils are equal, round, and reactive to light. No scleral icterus.  Neck: Normal range of motion. Neck supple. No thyromegaly present.  Cardiovascular: Normal rate and regular rhythm.  Exam reveals no gallop and no friction rub.   No murmur heard. Pulmonary/Chest: Effort normal and breath sounds normal. No respiratory distress. He has no wheezes. He has no rales.  Abdominal: Soft. Bowel sounds are normal. He exhibits no distension. There is no tenderness. There is no rebound.  Musculoskeletal: Normal range of motion.  Pain not reproduced with palpation over the IT band. Limited range of motion due to pain. Does not seem to be reproducible with palpation during range of motion. Points to his area of his hip joint. Not on the pelvis.  Neurological: He is alert and oriented to person, place, and time.  Skin: Skin is warm and dry. No rash noted.  Psychiatric: He has a normal mood and affect. His behavior is normal.     ED Treatments / Results  Labs (all labs ordered are listed, but only abnormal results are displayed) Labs Reviewed - No  data to display  EKG  EKG Interpretation None       Radiology Dg Hip Unilat W Or Wo Pelvis 2-3 Views Right  Result Date: 10/15/2015 CLINICAL DATA:  [place] today, RIGHT hip pain, history of avascular necrosis with recent RIGHT hip replacement on 09/24/2015 EXAM: DG HIP (WITH OR WITHOUT PELVIS) 2-3V RIGHT COMPARISON:  09/26/2015 FINDINGS: Bowel hip prostheses. Orthopedic hardware intact. Question osseous demineralization. No acute fracture, dislocation, or bone destruction. No periprosthetic lucency. Pelvis appears intact with symmetric SI joints. IMPRESSION: BILATERAL hip prostheses. No acute abnormalities. Electronically Signed   By: Lavonia Dana M.D.   On: 10/15/2015 17:40    Procedures Procedures (including critical care time)  Medications Ordered in ED Medications  oxyCODONE-acetaminophen (PERCOCET/ROXICET) 5-325 MG per tablet 2 tablet (2 tablets Oral Refused 10/15/15 1858)  oxyCODONE (Oxy IR/ROXICODONE) immediate release tablet 20 mg (20 mg Oral Given 10/15/15 1906)     Initial Impression / Assessment and Plan / ED Course  I have reviewed the triage vital signs and the nursing notes.  Pertinent labs & imaging results that were available during my care of the patient were reviewed by me and considered in my medical decision making (see chart for details).  Clinical Course    Pelvis and hip films show no acute abnormality. No pelvic or sacral fracture. Normal prosthesis. I did discuss the case with Dr. Ihor Gully. He felt that keeping the patient nonweightbearing with crutches and follow-up with the patient's surgeon, Dr. Maureen Ralphs after the weekend would be adequate.  Final Clinical Impressions(s) / ED Diagnoses   Final diagnoses:  Hip pain, right    New Prescriptions New Prescriptions   OXYCODONE HCL 20 MG TABS    Take 1 tablet (20 mg total) by mouth 3 (three) times daily.     Tanna Furry, MD 10/15/15 2033    Tanna Furry, MD 10/15/15 2034

## 2015-10-15 NOTE — ED Notes (Signed)
Patient d/c'd self care.  F/U and medication reviewed.  Patient verbalized understanding. 

## 2015-10-15 NOTE — ED Notes (Signed)
MD made aware of BP

## 2015-10-15 NOTE — ED Notes (Signed)
Patient ambulatory with the assistance of cane.

## 2016-01-08 ENCOUNTER — Emergency Department (HOSPITAL_COMMUNITY): Payer: Medicare Other

## 2016-01-08 ENCOUNTER — Inpatient Hospital Stay (HOSPITAL_COMMUNITY)
Admission: EM | Admit: 2016-01-08 | Discharge: 2016-01-11 | DRG: 193 | Disposition: A | Discharge status: Home / Self Care | Payer: Medicare Other | Attending: Internal Medicine | Admitting: Internal Medicine

## 2016-01-08 ENCOUNTER — Encounter (HOSPITAL_COMMUNITY): Payer: Self-pay | Admitting: Emergency Medicine

## 2016-01-08 DIAGNOSIS — Z7951 Long term (current) use of inhaled steroids: Secondary | ICD-10-CM | POA: Diagnosis not present

## 2016-01-08 DIAGNOSIS — M810 Age-related osteoporosis without current pathological fracture: Secondary | ICD-10-CM | POA: Diagnosis present

## 2016-01-08 DIAGNOSIS — M879 Osteonecrosis, unspecified: Secondary | ICD-10-CM | POA: Diagnosis present

## 2016-01-08 DIAGNOSIS — J441 Chronic obstructive pulmonary disease with (acute) exacerbation: Secondary | ICD-10-CM | POA: Diagnosis present

## 2016-01-08 DIAGNOSIS — Z87891 Personal history of nicotine dependence: Secondary | ICD-10-CM | POA: Diagnosis not present

## 2016-01-08 DIAGNOSIS — Z9981 Dependence on supplemental oxygen: Secondary | ICD-10-CM

## 2016-01-08 DIAGNOSIS — Z96641 Presence of right artificial hip joint: Secondary | ICD-10-CM | POA: Diagnosis present

## 2016-01-08 DIAGNOSIS — Z7901 Long term (current) use of anticoagulants: Secondary | ICD-10-CM

## 2016-01-08 DIAGNOSIS — Z833 Family history of diabetes mellitus: Secondary | ICD-10-CM | POA: Diagnosis not present

## 2016-01-08 DIAGNOSIS — Z8249 Family history of ischemic heart disease and other diseases of the circulatory system: Secondary | ICD-10-CM

## 2016-01-08 DIAGNOSIS — R509 Fever, unspecified: Secondary | ICD-10-CM | POA: Diagnosis present

## 2016-01-08 DIAGNOSIS — J440 Chronic obstructive pulmonary disease with acute lower respiratory infection: Secondary | ICD-10-CM | POA: Diagnosis present

## 2016-01-08 DIAGNOSIS — J9621 Acute and chronic respiratory failure with hypoxia: Secondary | ICD-10-CM | POA: Diagnosis present

## 2016-01-08 DIAGNOSIS — J159 Unspecified bacterial pneumonia: Secondary | ICD-10-CM | POA: Diagnosis present

## 2016-01-08 DIAGNOSIS — Z86718 Personal history of other venous thrombosis and embolism: Secondary | ICD-10-CM | POA: Diagnosis not present

## 2016-01-08 DIAGNOSIS — Z7982 Long term (current) use of aspirin: Secondary | ICD-10-CM | POA: Diagnosis not present

## 2016-01-08 DIAGNOSIS — G894 Chronic pain syndrome: Secondary | ICD-10-CM | POA: Diagnosis not present

## 2016-01-08 DIAGNOSIS — Z79891 Long term (current) use of opiate analgesic: Secondary | ICD-10-CM

## 2016-01-08 DIAGNOSIS — J181 Lobar pneumonia, unspecified organism: Secondary | ICD-10-CM | POA: Diagnosis not present

## 2016-01-08 DIAGNOSIS — Z79899 Other long term (current) drug therapy: Secondary | ICD-10-CM

## 2016-01-08 DIAGNOSIS — Z23 Encounter for immunization: Secondary | ICD-10-CM | POA: Diagnosis not present

## 2016-01-08 DIAGNOSIS — J189 Pneumonia, unspecified organism: Secondary | ICD-10-CM | POA: Diagnosis present

## 2016-01-08 DIAGNOSIS — G8929 Other chronic pain: Secondary | ICD-10-CM | POA: Diagnosis present

## 2016-01-08 DIAGNOSIS — J9620 Acute and chronic respiratory failure, unspecified whether with hypoxia or hypercapnia: Secondary | ICD-10-CM | POA: Diagnosis present

## 2016-01-08 DIAGNOSIS — E876 Hypokalemia: Secondary | ICD-10-CM

## 2016-01-08 LAB — COMPREHENSIVE METABOLIC PANEL
ALBUMIN: 3.7 g/dL (ref 3.5–5.0)
ALK PHOS: 102 U/L (ref 38–126)
ALT: 23 U/L (ref 17–63)
AST: 22 U/L (ref 15–41)
Anion gap: 10 (ref 5–15)
BUN: 8 mg/dL (ref 6–20)
CALCIUM: 8.8 mg/dL — AB (ref 8.9–10.3)
CHLORIDE: 97 mmol/L — AB (ref 101–111)
CO2: 29 mmol/L (ref 22–32)
CREATININE: 0.81 mg/dL (ref 0.61–1.24)
GFR calc Af Amer: >60 mL/min (ref >60)
GFR calc non Af Amer: >60 mL/min (ref >60)
GLUCOSE: 125 mg/dL — AB (ref 65–99)
Potassium: 3.4 mmol/L — ABNORMAL LOW (ref 3.5–5.1)
SODIUM: 136 mmol/L (ref 135–145)
Total Bilirubin: 0.3 mg/dL (ref 0.3–1.2)
Total Protein: 7.5 g/dL (ref 6.5–8.1)

## 2016-01-08 LAB — CBC
HEMATOCRIT: 39.7 % (ref 39.0–52.0)
Hemoglobin: 13.3 g/dL (ref 13.0–17.0)
MCH: 31.4 pg (ref 26.0–34.0)
MCHC: 33.5 g/dL (ref 30.0–36.0)
MCV: 93.9 fL (ref 78.0–100.0)
Platelets: 261 10*3/uL (ref 150–400)
RBC: 4.23 MIL/uL (ref 4.22–5.81)
RDW: 12.3 % (ref 11.5–15.5)
WBC: 14.1 10*3/uL — AB (ref 4.0–10.5)

## 2016-01-08 LAB — LACTIC ACID, PLASMA: Lactic Acid, Venous: 1.3 mmol/L (ref 0.5–1.9)

## 2016-01-08 LAB — TROPONIN I: Troponin I: <0.03 ng/mL (ref <0.03)

## 2016-01-08 MED ORDER — SODIUM CHLORIDE 0.9 % IV BOLUS (SEPSIS)
1000.0000 mL | Freq: Once | INTRAVENOUS | Status: AC
  Administered 2016-01-08: 1000 mL via INTRAVENOUS

## 2016-01-08 MED ORDER — ASPIRIN 325 MG PO TABS
325.0000 mg | ORAL_TABLET | Freq: Every day | ORAL | Status: DC
  Administered 2016-01-09 – 2016-01-11 (×3): 325 mg via ORAL
  Filled 2016-01-08 (×3): qty 1

## 2016-01-08 MED ORDER — HYDROMORPHONE HCL 1 MG/ML IJ SOLN
1.0000 mg | Freq: Once | INTRAMUSCULAR | Status: AC
  Administered 2016-01-08: 1 mg via INTRAVENOUS
  Filled 2016-01-08: qty 1

## 2016-01-08 MED ORDER — OXYCODONE HCL 5 MG PO TABS
20.0000 mg | ORAL_TABLET | Freq: Three times a day (TID) | ORAL | Status: DC | PRN
  Administered 2016-01-09: 20 mg via ORAL
  Filled 2016-01-08: qty 4

## 2016-01-08 MED ORDER — SODIUM CHLORIDE 0.9 % IV SOLN: 250.0000 mL | INTRAVENOUS | Status: DC | PRN

## 2016-01-08 MED ORDER — ACETAMINOPHEN 500 MG PO TABS
1000.0000 mg | ORAL_TABLET | Freq: Once | ORAL | Status: AC
  Administered 2016-01-08: 1000 mg via ORAL
  Filled 2016-01-08: qty 2

## 2016-01-08 MED ORDER — POLYVINYL ALCOHOL 1.4 % OP SOLN: 1.0000 [drp] | Freq: Every day | OPHTHALMIC | Status: DC | PRN

## 2016-01-08 MED ORDER — ALBUTEROL (5 MG/ML) CONTINUOUS INHALATION SOLN: 10.0000 mg/h | INHALATION_SOLUTION | Freq: Once | RESPIRATORY_TRACT | Status: DC

## 2016-01-08 MED ORDER — RIVAROXABAN 10 MG PO TABS
10.0000 mg | ORAL_TABLET | Freq: Every day | ORAL | Status: DC
  Administered 2016-01-09 – 2016-01-11 (×3): 10 mg via ORAL
  Filled 2016-01-08 (×4): qty 1

## 2016-01-08 MED ORDER — OXYCODONE HCL ER 20 MG PO T12A: 20.0000 mg | EXTENDED_RELEASE_TABLET | Freq: Three times a day (TID) | ORAL | Status: DC

## 2016-01-08 MED ORDER — DEXTROSE 5 % IV SOLN
1.0000 g | Freq: Once | INTRAVENOUS | Status: AC
  Administered 2016-01-08: 1 g via INTRAVENOUS
  Filled 2016-01-08: qty 10

## 2016-01-08 MED ORDER — IPRATROPIUM-ALBUTEROL 0.5-2.5 (3) MG/3ML IN SOLN
3.0000 mL | Freq: Once | RESPIRATORY_TRACT | Status: AC
  Administered 2016-01-08: 3 mL via RESPIRATORY_TRACT
  Filled 2016-01-08: qty 3

## 2016-01-08 MED ORDER — SODIUM CHLORIDE 0.9% FLUSH
3.0000 mL | Freq: Two times a day (BID) | INTRAVENOUS | Status: DC
  Administered 2016-01-08 – 2016-01-11 (×5): 3 mL via INTRAVENOUS

## 2016-01-08 MED ORDER — IPRATROPIUM-ALBUTEROL 0.5-2.5 (3) MG/3ML IN SOLN: 3.0000 mL | RESPIRATORY_TRACT | Status: DC | PRN

## 2016-01-08 MED ORDER — METHOCARBAMOL 500 MG PO TABS: 500.0000 mg | ORAL_TABLET | Freq: Four times a day (QID) | ORAL | Status: DC | PRN

## 2016-01-08 MED ORDER — SODIUM CHLORIDE 0.9 % IV SOLN
INTRAVENOUS | Status: DC
  Administered 2016-01-08: via INTRAVENOUS

## 2016-01-08 MED ORDER — MOMETASONE FURO-FORMOTEROL FUM 200-5 MCG/ACT IN AERO
2.0000 | INHALATION_SPRAY | Freq: Two times a day (BID) | RESPIRATORY_TRACT | Status: DC
  Administered 2016-01-10 – 2016-01-11 (×3): 2 via RESPIRATORY_TRACT
  Filled 2016-01-08: qty 8.8

## 2016-01-08 MED ORDER — POTASSIUM CHLORIDE CRYS ER 20 MEQ PO TBCR
30.0000 meq | EXTENDED_RELEASE_TABLET | Freq: Once | ORAL | Status: AC
  Administered 2016-01-09: 30 meq via ORAL
  Filled 2016-01-08: qty 1

## 2016-01-08 MED ORDER — IPRATROPIUM-ALBUTEROL 0.5-2.5 (3) MG/3ML IN SOLN
3.0000 mL | Freq: Four times a day (QID) | RESPIRATORY_TRACT | Status: DC
  Administered 2016-01-09 – 2016-01-11 (×8): 3 mL via RESPIRATORY_TRACT
  Filled 2016-01-08 (×9): qty 3

## 2016-01-08 MED ORDER — GABAPENTIN 300 MG PO CAPS
600.0000 mg | ORAL_CAPSULE | Freq: Three times a day (TID) | ORAL | Status: DC
  Administered 2016-01-09 – 2016-01-11 (×8): 600 mg via ORAL
  Filled 2016-01-08 (×8): qty 2

## 2016-01-08 MED ORDER — SODIUM CHLORIDE 0.9 % IV BOLUS (SEPSIS)
500.0000 mL | Freq: Once | INTRAVENOUS | Status: AC
  Administered 2016-01-08: 500 mL via INTRAVENOUS

## 2016-01-08 MED ORDER — SODIUM CHLORIDE 0.9 % IV BOLUS (SEPSIS): 500.0000 mL | Freq: Once | INTRAVENOUS | Status: DC

## 2016-01-08 MED ORDER — ONDANSETRON HCL 4 MG/2ML IJ SOLN
4.0000 mg | Freq: Once | INTRAMUSCULAR | Status: AC
  Administered 2016-01-08: 4 mg via INTRAVENOUS
  Filled 2016-01-08: qty 2

## 2016-01-08 MED ORDER — SODIUM CHLORIDE 0.9% FLUSH: 3.0000 mL | INTRAVENOUS | Status: DC | PRN

## 2016-01-08 MED ORDER — MORPHINE SULFATE ER 30 MG PO TBCR
30.0000 mg | EXTENDED_RELEASE_TABLET | Freq: Three times a day (TID) | ORAL | Status: DC
  Administered 2016-01-09 – 2016-01-11 (×7): 30 mg via ORAL
  Filled 2016-01-08 (×7): qty 1

## 2016-01-08 MED ORDER — DEXTROSE 5 % IV SOLN
500.0000 mg | Freq: Once | INTRAVENOUS | Status: AC
  Administered 2016-01-08: 500 mg via INTRAVENOUS
  Filled 2016-01-08: qty 500

## 2016-01-08 NOTE — H&P (Signed)
History and Physical    [name] [id number] DOB: [birth year] DOA: 01/08/2016  PCP: [name], MD   Patient coming from: Home  Chief Complaint: Dyspnea, fever, cough   HPI: Wesley [name] is a 50 y.o. male with medical history significant for COPD and asbestosis with chronic hypoxic respiratory failure, avascular hip necrosis and chronic pain, and history of lower extremity DVT on Xarelto, now presenting to the emergency department with progressive dyspnea and productive cough with fevers and chills. Patient uses 3 L/m of supplemental oxygen at baseline and had been in his usual state of health until he noted the insidious development of worsening chronic cough with increased sputum production and increased exertional dyspnea on 01/03/2016. Symptoms worsened and by 01/05/2016, patient had subjective fevers and chills and was dyspneic with minimal exertion. Subjective fevers, chills, production of thick yellow sputum, and dyspnea with minimal exertion have all persisted through the day today and have been seemingly unaffected by patient's increased use of his home neb therapies. He has not been on antibiotics recently and last treatment with steroids was in August. He denies any chest pain or palpitations and denies lower extremity edema or orthopnea. There has been no long distance travel or sick contacts.  ED Course: Upon arrival to the ED, patient is found to have temperature of 37.9 C, saturating 89% on room air, tachycardic in the 120s, and with stable blood pressure. EKG demonstrates sinus tachycardia with rate 117 and chest x-ray is notable for multifocal pneumonia superimposed on emphysema and chronic lung disease changes. Chemistry panels notable for a potassium of 3.4 and CBC features a leukocytosis of 14,100. Lactic acid is reassuring at 1.3 and troponin is undetectable. Blood cultures were obtained in the emergency department. Patient was given a 1.5 L normal saline bolus, 1  gram of Tylenol, and started on empiric treatment with Rocephin and azithromycin. Tachycardia improved and O2 saturation normalized with supplemental oxygen via nasal cannula. Patient will be admitted to the telemetry unit for ongoing evaluation and management of community-acquired pneumonia with severe underlying chronic pulmonary disease.  Review of Systems:  All other systems reviewed and apart from HPI, are negative.  Past Medical History:  Diagnosis Date  . Avascular necrosis of femur ([place])   . Back pain   . COPD (chronic obstructive pulmonary disease) ([place])   . Neuropathy ([place])   . Osteoporosis   . Pulmonary asbestosis ([place])   . 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 BACK SURGERY    . EYE SURGERY     left eye cornea and lens replaced- blind in left eye  . JOINT REPLACEMENT    . LUNG SURGERY    . TOTAL HIP ARTHROPLASTY Right 09/24/2015   Procedure: TOTAL HIP ARTHROPLASTY ANTERIOR APPROACH;  Surgeon: Gaynelle Arabian, MD;  Location: [facility];  Service: Orthopedics;  Laterality: Right;     reports that he quit smoking about 4 years ago. He smoked 1.00 pack per day. He has never used smokeless tobacco. He reports that he does not drink alcohol or use drugs.  Allergies  Allergen Reactions  . Ambien [Zolpidem Tartrate] Other (See Comments)    hallucinations  . Melatonin Itching  . Zolpidem Other (See Comments)    Altered mental status    Family History  Problem Relation Age of Onset  . Heart failure Father   .  Diabetes Father   . Diabetes Mother   . Cancer Mother     male cancer     Prior to Admission medications   Medication Sig Start Date End Date Taking? Authorizing Provider  ADVAIR DISKUS 250-50 MCG/DOSE AEPB Inhale 1 puff into the lungs 2 (two) times daily. 06/14/15  Yes Historical Provider, MD  albuterol (PROVENTIL HFA;VENTOLIN HFA) 108 (90 BASE) MCG/ACT inhaler Inhale 2  puffs into the lungs every 4 (four) hours as needed for wheezing or shortness of breath. Shortness of breath 01/06/12  Yes Delfina Redwood, MD  albuterol (PROVENTIL) (2.5 MG/3ML) 0.083% nebulizer solution Take 3 mLs (2.5 mg total) by nebulization every 6 (six) hours as needed for wheezing or shortness of breath. 02/27/14  Yes Kathie Dike, MD  aspirin 325 MG tablet Take 325 mg by mouth daily.   Yes Historical Provider, MD  budesonide-formoterol (SYMBICORT) 160-4.5 MCG/ACT inhaler Inhale 3 puffs into the lungs 4 (four) times daily.   Yes Historical Provider, MD  furosemide (LASIX) 40 MG tablet Take 40 mg by mouth daily.   Yes Historical Provider, MD  gabapentin (NEURONTIN) 300 MG capsule Take 600 mg by mouth 3 (three) times daily.    Yes Historical Provider, MD  methocarbamol (ROBAXIN) 500 MG tablet Take 1 tablet (500 mg total) by mouth every 6 (six) hours as needed for muscle spasms. 09/24/15  Yes Gaynelle Arabian, MD  morphine (MS CONTIN) 30 MG 12 hr tablet Take 30 mg by mouth every 8 (eight) hours. 08/04/15  Yes Historical Provider, MD  naloxone HCl (NARCAN) 4 MG/0.1ML LIQD Place 4 mg into the nose as directed. To prevent overdose   Yes Historical Provider, MD  ondansetron (ZOFRAN ODT) 4 MG disintegrating tablet Take 1 tablet (4 mg total) by mouth every 8 (eight) hours as needed for nausea or vomiting. 05/24/14  Yes Samuella Cota, MD  Oxycodone HCl 20 MG TABS Take 1 tablet (20 mg total) by mouth 3 (three) times daily. 10/15/15  Yes Tanna Furry, MD  Polyethyl Glycol-Propyl Glycol (SYSTANE) 0.4-0.3 % SOLN Place 1-2 drops into the left eye daily as needed (for dry eye relief).    Yes Historical Provider, MD  rivaroxaban (XARELTO) 10 MG TABS tablet Take 1 tablet (10 mg total) by mouth daily with breakfast. 09/25/15  Yes Gaynelle Arabian, MD  tiotropium (SPIRIVA) 18 MCG inhalation capsule Place 1 capsule (18 mcg total) into inhaler and inhale daily. 07/24/11  Yes Nimish Luther Parody, MD  predniSONE (DELTASONE) 20  MG tablet Take 2 tablets (40 mg total) by mouth daily. 08/26/15   Virgel Manifold, MD    Physical Exam: Vitals:   01/08/16 2209 01/08/16 2215 01/08/16 2230 01/08/16 2300  BP:   106/77 97/64  Pulse:  116 (!) 121 113  Resp:  16 15 19   Temp: 99.1 F (37.3 C)     TempSrc: Oral     SpO2:  94% 92% 94%  Weight:      Height:          Constitutional: Acute respiratory distress with tachypnea and accessory muscle recruitment; no pallor or cyanosis Eyes: PERTLA, lids and conjunctivae normal ENMT: Mucous membranes are moist. Posterior pharynx clear of any exudate or lesions.   Neck: normal, supple, no masses, no thyromegaly Respiratory: Scattered rhonchi, inspiratory and expiratory wheezes, prolonged expiratory phase, increased WOB.   Cardiovascular: Rate ~120 and regular. No extremity edema. No significant JVD. Abdomen: No distension, no tenderness, no masses palpated. Bowel sounds normal.  Musculoskeletal: no clubbing /  cyanosis. No joint deformity upper and lower extremities. Normal muscle tone.  Skin: no significant rashes, lesions, ulcers. Warm, dry, well-perfused. Neurologic: CN 2-12 grossly intact. Sensation intact, DTR normal. Strength 5/5 in all 4 limbs.  Psychiatric: Normal judgment and insight. Alert and oriented x 3. Normal mood and affect.     Labs on Admission: I have personally reviewed following labs and imaging studies  CBC:  Recent Labs Lab 01/08/16 2116  WBC 14.1*  HGB 13.3  HCT 39.7  MCV 93.9  PLT 0000000   Basic Metabolic Panel:  Recent Labs Lab 01/08/16 2116  NA 136  K 3.4*  CL 97*  CO2 29  GLUCOSE 125*  BUN 8  CREATININE 0.81  CALCIUM 8.8*   GFR: Estimated Creatinine Clearance: 120.6 mL/min (by C-G formula based on SCr of 0.81 mg/dL). Liver Function Tests:  Recent Labs Lab 01/08/16 2116  AST 22  ALT 23  ALKPHOS 102  BILITOT 0.3  PROT 7.5  ALBUMIN 3.7   No results for input(s): LIPASE, AMYLASE in the last 168 hours. No results for  input(s): AMMONIA in the last 168 hours. Coagulation Profile: No results for input(s): INR, PROTIME in the last 168 hours. Cardiac Enzymes:  Recent Labs Lab 01/08/16 2116  TROPONINI <0.03   BNP (last 3 results) No results for input(s): PROBNP in the last 8760 hours. HbA1C: No results for input(s): HGBA1C in the last 72 hours. CBG: No results for input(s): GLUCAP in the last 168 hours. Lipid Profile: No results for input(s): CHOL, HDL, LDLCALC, TRIG, CHOLHDL, LDLDIRECT in the last 72 hours. Thyroid Function Tests: No results for input(s): TSH, T4TOTAL, FREET4, T3FREE, THYROIDAB in the last 72 hours. Anemia Panel: No results for input(s): VITAMINB12, FOLATE, FERRITIN, TIBC, IRON, RETICCTPCT in the last 72 hours. Urine analysis:    Component Value Date/Time   COLORURINE YELLOW 09/26/2015 1041   APPEARANCEUR CLOUDY (A) 09/26/2015 1041   LABSPEC 1.013 09/26/2015 1041   PHURINE 7.0 09/26/2015 1041   GLUCOSEU NEGATIVE 09/26/2015 1041   HGBUR NEGATIVE 09/26/2015 1041   [place] 09/26/2015 1041   KETONESUR NEGATIVE 09/26/2015 1041   PROTEINUR NEGATIVE 09/26/2015 1041   UROBILINOGEN 0.2 05/23/2014 0102   NITRITE NEGATIVE 09/26/2015 1041   LEUKOCYTESUR NEGATIVE 09/26/2015 1041   Sepsis Labs: @LABRCNTIP (procalcitonin:4,lacticidven:4) )No results found for this or any previous visit (from the past 240 hour(s)).   Radiological Exams on Admission: Dg Chest 2 View  Result Date: 01/08/2016 CLINICAL DATA:  50 year old male with a history of cough EXAM: CHEST  2 VIEW COMPARISON:  09/26/2015, 09/17/2015 FINDINGS: Cardiomediastinal silhouette unchanged in size and contour. Interval development of mixed interstitial and airspace opacities of the bilateral lungs. Stigmata of emphysema, with increased retrosternal airspace, flattened hemidiaphragms, increased AP diameter, and hyperinflation on the AP view. No pneumothorax. Scarring of the bilateral lung apices. No pleural effusion.  No displaced fracture. IMPRESSION: Multifocal pneumonia superimposed on emphysema and chronic lung changes. Followup PA and lateral chest X-ray is recommended in 3-4 weeks following treatment to assure resolution. Signed, Dulcy Fanny. Earleen Newport, DO [facility] Electronically Signed   By: Corrie Mckusick D.O.   On: 01/08/2016 21:28    EKG: Independently reviewed. Sinus tachycardia (rate 117)   Assessment/Plan  1. CAP  - Presents with fevers at home, leukocytosis, productive cough, and CXR findings supportive of PNA  - Lactic acid is reassuring at 1.3 and there is no AKI or confusion  - Blood cultures obtained in ED; sputum cultures  and gram-stain requested; check urine for strep pnuemo antigen  - He was given 1.5 L NS in ED and will be continued on gentle IVF hydration  - Empiric treatment started with Rocephin and azithromycin in ED, will continue this while awaiting culture data   2. COPD with exacerbation, acute on chronic hypoxic respiratory failure - Pt requires 3 Lpm supplemental O2 at baseline  - He presents with exacerbation in COPD, precipitated by bacterial PNA  - He is being treated with nebs and abx; holding steroids for now as primary problem is infectious - Continue ICS/LABA and DuoNebs, Rocephin and azithromycin    3. Hypokalemia  - Serum potassium 3.4 on admission; albuterol may be contributing  - Give 30 mEq oral potassium  - Repeat chem panel with mag level in am    4. Hx of DVT  - Hx of LLE DVT on Xarelto  - No suggestion of acute VTE or bleeding; will continue Xarelto   5. Chronic pain  - Attributed to degenerative disc disease and avascular necrosis of hip - Continue home regimen of MS Contin and oxycodone    DVT prophylaxis: Xarelto  Code Status: Full  Family Communication: Discussed with patient Disposition Plan: Admit to telemetry Consults called: None Admission status: Inpatient    Vianne Bulls,  MD [facility] Pager [phone]  If 7PM-7AM, please contact night-coverage [email or web address] Password [email or web address]  01/08/2016, 11:21 PM

## 2016-01-08 NOTE — ED Provider Notes (Signed)
Emergency Department Provider Note  By signing my name below, I, Ephriam Jenkins, attest that this documentation has been prepared under the direction and in the presence of [place]*. Electronically signed, Ephriam Jenkins, ED Scribe. 01/08/16. 9:36 PM.  I have reviewed the triage vital signs and the nursing notes.   HISTORY  Chief Complaint Chest Pain   HPI HPI Comments: Wesley [name] is a 50 y.o. male with Hx of COPD, who presents to the Emergency Department complaining of gradually worsening shortness of breath, productive cough with associated fever onset six days ago. Pt states "it feels like my lungs are full". He has chronic lung problems and has had multiple cases of pneumonia, states that this feels similar. He reports frequent productive cough with yellow sputum which is common for him. Pt has used his nebulizer every 6 hours with no relief, last used PTA. He is on 3L O2 via [place] at home. No Hx of MI.    Past Medical History:  Diagnosis Date  . Avascular necrosis of femur ([place])   . Back pain   . COPD (chronic obstructive pulmonary disease) ([place])   . Neuropathy ([place])   . Osteoporosis   . Pulmonary asbestosis ([place])   . 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 Hypokalemia 01/08/2016  . History of DVT (deep vein thrombosis) 01/08/2016  . OA (osteoarthritis) of hip 09/24/2015  . CAP (community acquired pneumonia) 04/14/2015  . Vomiting and diarrhea   . Solitary pulmonary nodule 05/18/2014  . Nausea and vomiting 05/16/2014  . Bronchitis, acute, with bronchospasm 02/24/2014  . Osteoporosis, unspecified 08/06/2012  . Hip pain 08/06/2012  . Opiate abuse, continuous 01/05/2012  . Opiate dependence ([place]) 01/03/2012  . Chronic respiratory failure ([place]) 01/03/2012  . COPD exacerbation ([place]) 01/03/2012  . Pulmonary infection 01/03/2012  . Pneumonia 07/19/2011  .  Fever 07/18/2011  . Myalgia 07/18/2011  . Leukocytosis 07/18/2011  . COPD (chronic obstructive pulmonary disease) ([place]) 07/18/2011  . Acute-on-chronic respiratory failure ([place]) 07/18/2011  . Tachycardia 07/18/2011  . Dehydration 07/18/2011  . Chronic pain 07/18/2011  . Tobacco abuse 07/18/2011    Past Surgical History:  Procedure Laterality Date  . BACK SURGERY    . EYE SURGERY     left eye cornea and lens replaced- blind in left eye  . JOINT REPLACEMENT    . LUNG SURGERY    . TOTAL HIP ARTHROPLASTY Right 09/24/2015   Procedure: TOTAL HIP ARTHROPLASTY ANTERIOR APPROACH;  Surgeon: Gaynelle Arabian, MD;  Location: [facility];  Service: Orthopedics;  Laterality: Right;      Allergies Ambien [zolpidem tartrate]; Melatonin; and Zolpidem  Family History  Problem Relation Age of Onset  . Heart failure Father   . Diabetes Father   . Diabetes Mother   . Cancer Mother     male cancer    Social History Social History  Substance Use Topics  . Smoking status: Former Smoker    Packs/day: 1.00    Quit date: 07/05/2011  . Smokeless tobacco: Never Used  . Alcohol use No    Review of Systems  Constitutional: + fever/chills Eyes: No visual changes. ENT: No sore throat. Cardiovascular: Denies chest pain. Respiratory: + shortness of breath. Gastrointestinal: No abdominal pain.  No nausea, no vomiting.  No diarrhea.  No constipation. Genitourinary: Negative for dysuria. Musculoskeletal: Negative for back  pain. Skin: Negative for rash. Neurological: Negative for headaches, focal weakness or numbness.  10-point ROS otherwise negative.  ____________________________________________   PHYSICAL EXAM:  VITAL SIGNS: ED Triage Vitals  Enc Vitals Group     BP 01/08/16 2044 (!) 136/53     Pulse Rate 01/08/16 2044 (!) 127     Resp 01/08/16 2044 17     Temp 01/08/16 2044 99.6 F (37.6 C)     Temp Source 01/08/16 2044 Oral     SpO2 01/08/16 2044 (!) 89 %     Weight 01/08/16 2045 180  lb (81.6 kg)     Height 01/08/16 2045 6\' 1"  (1.854 m)     Pain Score 01/08/16 2046 10    Constitutional: Alert and oriented. Appears to be in moderate respiratory distress.  Eyes: Conjunctivae are normal.  Head: Atraumatic. Nose: No congestion/rhinnorhea. Mouth/Throat: Mucous membranes are moist.  Oropharynx non-erythematous. Neck: No stridor.  Cardiovascular: Normal rate, regular rhythm. Good peripheral circulation. Grossly normal heart sounds.   Respiratory: Increased respiratory effort and tachypnea.  No retractions. Lungs with diffuse wheezing. No area of focal consolidation. . Gastrointestinal: Soft and nontender. No distention.  Musculoskeletal: No lower extremity tenderness nor edema. No gross deformities of extremities. Neurologic:  Normal speech and language. No gross focal neurologic deficits are appreciated.  Skin:  Skin is warm, dry and intact. No rash noted.   ____________________________________________   LABS (all labs ordered are listed, but only abnormal results are displayed)  Labs Reviewed  CBC - Abnormal; Notable for the following:       Result Value   WBC 14.1 (*)    All other components within normal limits  COMPREHENSIVE METABOLIC PANEL - Abnormal; Notable for the following:    Potassium 3.4 (*)    Chloride 97 (*)    Glucose, Bld 125 (*)    Calcium 8.8 (*)    All other components within normal limits  BASIC METABOLIC PANEL - Abnormal; Notable for the following:    Chloride 99 (*)    Glucose, Bld 113 (*)    Calcium 8.5 (*)    All other components within normal limits  CULTURE, BLOOD (ROUTINE X 2)  CULTURE, BLOOD (ROUTINE X 2)  CULTURE, EXPECTORATED SPUTUM-ASSESSMENT  CULTURE, EXPECTORATED SPUTUM-ASSESSMENT  GRAM STAIN  TROPONIN I  LACTIC ACID, PLASMA  LACTIC ACID, PLASMA  MAGNESIUM  HIV ANTIBODY (ROUTINE TESTING)  STREP PNEUMONIAE URINARY ANTIGEN   ____________________________________________  EKG   EKG  Interpretation  Date/Time:  Saturday January 08 2016 21:18:08 EDT Ventricular Rate:  117 PR Interval:    QRS Duration: 86 QT Interval:  312 QTC Calculation: 436 R Axis:   99 Text Interpretation:  Sinus tachycardia Consider right ventricular hypertrophy No STEMI.  Confirmed by LONG MD, JOSHUA [phone]) on 01/08/2016 10:14:26 PM       ____________________________________________  RADIOLOGY  Dg Chest 2 View  Result Date: 01/08/2016 CLINICAL DATA:  50 year old male with a history of cough EXAM: CHEST  2 VIEW COMPARISON:  09/26/2015, 09/17/2015 FINDINGS: Cardiomediastinal silhouette unchanged in size and contour. Interval development of mixed interstitial and airspace opacities of the bilateral lungs. Stigmata of emphysema, with increased retrosternal airspace, flattened hemidiaphragms, increased AP diameter, and hyperinflation on the AP view. No pneumothorax. Scarring of the bilateral lung apices. No pleural effusion. No displaced fracture. IMPRESSION: Multifocal pneumonia superimposed on emphysema and chronic lung changes. Followup PA and lateral chest X-ray is recommended in 3-4 weeks following treatment to assure resolution. Signed, Dulcy Fanny. Earleen Newport, DO  [facility] Radiology Electronically Signed   By: Corrie Mckusick D.O.   On: 01/08/2016 21:28    ____________________________________________   PROCEDURES  Procedure(s) performed:   Procedures  CRITICAL CARE Performed by: Margette Fast Total critical care time: 30 minutes Critical care time was exclusive of separately billable procedures and treating other patients. Critical care was necessary to treat or prevent imminent or life-threatening deterioration. Critical care was time spent personally by me on the following activities: development of treatment plan with patient and/or surrogate as well as nursing, discussions with consultants, evaluation of patient's response to treatment,  examination of patient, obtaining history from patient or surrogate, ordering and performing treatments and interventions, ordering and review of laboratory studies, ordering and review of radiographic studies, pulse oximetry and re-evaluation of patient's condition.  Nanda Quinton, MD Emergency Medicine  ____________________________________________   INITIAL IMPRESSION / ASSESSMENT AND PLAN / ED COURSE  Pertinent labs & imaging results that were available during my care of the patient were reviewed by me and considered in my medical decision making (see chart for details).  DIAGNOSTIC STUDIES: Oxygen Saturation is 95% on [place], normal by my interpretation.  COORDINATION OF CARE: 9:00 PM-Discussed treatment plan with pt at bedside and pt agreed to plan.    Patient resents to the emergency department with acute on chronic dyspnea, cough, fevers at home. Patient appears to the tachypnea, initial exam with associated sinus tachycardia. He has been using albuterol at home. He has a history of asbestosis and COPD. On 3 L nasal cannula oxygen at home. He has diffuse wheezing on exam with increased work of breathing. With associated fever or concern for underlying infectious process complicated by chronic lung disease. Plan for duoneb, CXR, labs, and IVF.   10:30 PM Patient with multifocal pneumonia on chest x-ray. He has associated leukocytosis. Normal lactate. With tachycardia, tachypnea, leukocytosis a code sepsis was initiated. Patient will receive a total of 1500 of IV fluid. He does not have hypertension or lactate greater than 4. We will cover with acquired pneumonia antibiotics and send blood cultures. On my reevaluation the patient he does have continued tachypnea and is asking for pain and nausea medication. He has chronic back pain and takes high doses of pain medication at home. Also complaining of continued difficulty breathing. We will start 1 hour of continuous albuterol and reassess.    Discussed patient's case with hospitlaist, Dr. Myna Hidalgo.  Recommend admission to inpatient, telemetry bed.  I will place holding orders per their request. Patient and family (if present) updated with plan. Care transferred to hospitalist service.  I reviewed all nursing notes, vitals, pertinent old records, EKGs, labs, imaging (as available).  ____________________________________________  FINAL CLINICAL IMPRESSION(S) / ED DIAGNOSES  Final diagnoses:  Community acquired pneumonia, unspecified laterality     MEDICATIONS GIVEN DURING THIS VISIT:  Medications  aspirin tablet 325 mg (325 mg Oral Given 01/09/16 0941)  mometasone-formoterol (DULERA) 200-5 MCG/ACT inhaler 2 puff (2 puffs Inhalation Not Given 01/09/16 0800)  morphine (MS CONTIN) 12 hr tablet 30 mg (30 mg Oral Given 01/09/16 0941)  methocarbamol (ROBAXIN) tablet 500 mg (not administered)  rivaroxaban (XARELTO) tablet 10 mg (10 mg Oral Given 01/09/16 0941)  gabapentin (NEURONTIN) capsule 600 mg (600 mg Oral Given 01/09/16 0941)  polyvinyl alcohol (LIQUIFILM TEARS) 1.4 % ophthalmic solution 1-2 drop (not administered)  sodium chloride flush (NS) 0.9 % injection 3 mL (3 mLs Intravenous Not Given 01/09/16 1000)  sodium chloride flush (NS)  0.9 % injection 3 mL (not administered)  0.9 %  sodium chloride infusion (not administered)  0.9 %  sodium chloride infusion ( Intravenous Rate/Dose Change 01/09/16 0641)  ipratropium-albuterol (DUONEB) 0.5-2.5 (3) MG/3ML nebulizer solution 3 mL (not administered)  ipratropium-albuterol (DUONEB) 0.5-2.5 (3) MG/3ML nebulizer solution 3 mL (3 mLs Nebulization Not Given 01/09/16 0800)  oxyCODONE (Oxy IR/ROXICODONE) immediate release tablet 30 mg (not administered)  azithromycin (ZITHROMAX) 500 mg in dextrose 5 % 250 mL IVPB (not administered)  cefTRIAXone (ROCEPHIN) 1 g in dextrose 5 % 50 mL IVPB (not administered)  ipratropium-albuterol (DUONEB) 0.5-2.5 (3) MG/3ML nebulizer solution 3 mL (3 mLs Nebulization  Given 01/08/16 2128)  sodium chloride 0.9 % bolus 500 mL (0 mLs Intravenous Stopped 01/08/16 2215)  sodium chloride 0.9 % bolus 1,000 mL (1,000 mLs Intravenous Transfusing/Transfer 01/08/16 2317)  cefTRIAXone (ROCEPHIN) 1 g in dextrose 5 % 50 mL IVPB (0 g Intravenous Stopped 01/08/16 2316)  azithromycin (ZITHROMAX) 500 mg in dextrose 5 % 250 mL IVPB (500 mg Intravenous Transfusing/Transfer 01/08/16 2317)  HYDROmorphone (DILAUDID) injection 1 mg (1 mg Intravenous Given 01/08/16 2221)  ondansetron (ZOFRAN) injection 4 mg (4 mg Intravenous Given 01/08/16 2221)  acetaminophen (TYLENOL) tablet 1,000 mg (1,000 mg Oral Given 01/08/16 2238)  potassium chloride (K-DUR,KLOR-CON) CR tablet 30 mEq (30 mEq Oral Given 01/09/16 0013)  Influenza vac split quadrivalent PF (FLUARIX) injection 0.5 mL (0.5 mLs Intramuscular Given 01/09/16 0943)  pneumococcal 23 valent vaccine (PNU-IMMUNE) injection 0.5 mL (0.5 mLs Intramuscular Given 01/09/16 0943)     NEW OUTPATIENT MEDICATIONS STARTED DURING THIS VISIT:  None  I personally performed the services described in this documentation, which was scribed in my presence. The recorded information has been reviewed and is accurate.    Note:  This document was prepared using Dragon voice recognition software and may include unintentional dictation errors.  Nanda Quinton, MD Emergency Medicine    Margette Fast, MD 01/09/16 [phone]

## 2016-01-08 NOTE — ED Notes (Signed)
Report given to Brandi RN

## 2016-01-08 NOTE — ED Notes (Signed)
Attempted x 1 for iv access without success, site infiltrated to left anterior forearm

## 2016-01-08 NOTE — ED Triage Notes (Signed)
Pt reports chest pain and SOB that started on Monday. Pt states he has chronic lung problems and "feels like both lungs are just full." Pt on O2 via [place] at home at 3L . Pt reports coughing up "dark yellow thick sputum."

## 2016-01-09 DIAGNOSIS — J189 Pneumonia, unspecified organism: Secondary | ICD-10-CM

## 2016-01-09 DIAGNOSIS — J441 Chronic obstructive pulmonary disease with (acute) exacerbation: Secondary | ICD-10-CM

## 2016-01-09 DIAGNOSIS — J9621 Acute and chronic respiratory failure with hypoxia: Secondary | ICD-10-CM

## 2016-01-09 LAB — EXPECTORATED SPUTUM ASSESSMENT W REFEX TO RESP CULTURE

## 2016-01-09 LAB — BASIC METABOLIC PANEL
ANION GAP: 6 (ref 5–15)
BUN: 9 mg/dL (ref 6–20)
CALCIUM: 8.5 mg/dL — AB (ref 8.9–10.3)
CO2: 31 mmol/L (ref 22–32)
Chloride: 99 mmol/L — ABNORMAL LOW (ref 101–111)
Creatinine, Ser: 0.83 mg/dL (ref 0.61–1.24)
Glucose, Bld: 113 mg/dL — ABNORMAL HIGH (ref 65–99)
POTASSIUM: 4.3 mmol/L (ref 3.5–5.1)
Sodium: 136 mmol/L (ref 135–145)

## 2016-01-09 LAB — LACTIC ACID, PLASMA: LACTIC ACID, VENOUS: 1 mmol/L (ref 0.5–1.9)

## 2016-01-09 LAB — MAGNESIUM: Magnesium: 2.4 mg/dL (ref 1.7–2.4)

## 2016-01-09 LAB — STREP PNEUMONIAE URINARY ANTIGEN: Strep Pneumo Urinary Antigen: NEGATIVE

## 2016-01-09 MED ORDER — INFLUENZA VAC SPLIT QUAD 0.5 ML IM SUSY
0.5000 mL | PREFILLED_SYRINGE | INTRAMUSCULAR | Status: AC
Start: 2016-01-09 — End: 2016-01-09
  Administered 2016-01-09: 0.5 mL via INTRAMUSCULAR
  Filled 2016-01-09: qty 0.5

## 2016-01-09 MED ORDER — PNEUMOCOCCAL VAC POLYVALENT 25 MCG/0.5ML IJ INJ
0.5000 mL | INJECTION | INTRAMUSCULAR | Status: AC
  Administered 2016-01-09: 0.5 mL via INTRAMUSCULAR
  Filled 2016-01-09: qty 0.5

## 2016-01-09 MED ORDER — DEXTROSE 5 % IV SOLN
1.0000 g | INTRAVENOUS | Status: DC
  Administered 2016-01-09 – 2016-01-10 (×2): 1 g via INTRAVENOUS
  Filled 2016-01-09 (×3): qty 10

## 2016-01-09 MED ORDER — OXYCODONE HCL 5 MG PO TABS
30.0000 mg | ORAL_TABLET | ORAL | Status: DC | PRN
  Administered 2016-01-09 – 2016-01-11 (×7): 30 mg via ORAL
  Filled 2016-01-09 (×7): qty 6

## 2016-01-09 MED ORDER — DEXTROSE 5 % IV SOLN
500.0000 mg | INTRAVENOUS | Status: DC
  Administered 2016-01-09 – 2016-01-10 (×2): 500 mg via INTRAVENOUS
  Filled 2016-01-09 (×3): qty 500

## 2016-01-09 NOTE — Progress Notes (Addendum)
PT heart rate ranges from 102-142 bpm. Pt has been tachycardic since Ed. PT running between 130-142 between 0615 and now. MD notified. Continue to Monitor.    ADDENDUM:  RN was asked to increase rate on IVF.

## 2016-01-09 NOTE — Progress Notes (Signed)
Pt. Medications sent to pharmacy, sheet is in chart.

## 2016-01-09 NOTE — Progress Notes (Signed)
Pt very lethargic. Pt main concern is pain medication and schedule. MD paged about dose and PRN med schedule. Bed alarmed due to Pt sitting on edge of bed and falling asleep. Continue to monitor.

## 2016-01-09 NOTE — Progress Notes (Signed)
PROGRESS NOTE    Wesley [name]  [id number] DOB: Feb 29, 1966 DOA: 01/08/2016 PCP: [name], MD    Brief Narrative: 73 yom with a history of COPD on 3L of home oxygen, chronic back pain, pulmonary asbestosis, and lower extremity DVT on xarelto, presented with complaints of progressive dyspnea and cough. While in the ED, he was noted to be hypoxic and tachycardic. Serology unremarkable except for WBC 14.1. CXR consistent with pneumonia. He was started on IV fluids, supplement oxygen via nasal canula, and empiric antibiotics azithromycin and Rocephin. He was admitted for further evaluation of CAP.   Assessment & Plan:   Principal Problem:   CAP (community acquired pneumonia) Active Problems:   Acute-on-chronic respiratory failure (HCC)   Chronic pain   COPD exacerbation (HCC)   Hypokalemia   History of DVT (deep vein thrombosis)  CAP  - Presents with leukocytosis 14,100 and a productive cough.  - CXR is consistent with pneumonia.  - Will follow blood and sputum cultures.  - Continue VI fluid, pulmonary hygiene, and empirical antibiotics azithromycin and rocephin.   COPD exacerbation  - On 3L of home oxygen  - Continue on nebs and empirical  antibiotics.   Respiratory failure with hypoxia  - Secondary to COPD exacerbation/pneumonia  - She does require 3L of oxygen.  - Continue nebs and  supplemental oxygen.   Hypokalemia  - Replaced.   Hx of DVT  - She does have a history of lower extremity DVT.   - She is chronically anticoagulated on Xarelto, will continue.   Chronic pain  - She has chronic back pain due to degenerative disc disease and avascular necrosis of hip.  - Will continue MS contin and oxycodone.  -No IV narcotics to be given this admission.  DVT prophylaxis: Xarelto  Code Status: FULL  Family Communication: patient only Disposition Plan: Undetermined.    Consultants:   None   Procedures:   None   Antimicrobials:   Azithromycin 11/5  >>  Rocephin 11/5 >>   Subjective: Feels improved, altho still SOB. C/o pain 'all over"  Objective: Vitals:   01/08/16 2300 01/08/16 2317 01/08/16 2348 01/09/16 0635  BP: 97/64  (!) 102/47 (!) 126/56  Pulse: 113  (!) 108 (!) 114  Resp: 19   20  Temp:   100.1 F (37.8 C) 99.4 F (37.4 C)  TempSrc:   Oral Oral  SpO2: 94% 94% 95% 97%  Weight:   82.1 kg (181 lb)   Height:   6\' 1"  (1.854 m)     Intake/Output Summary (Last 24 hours) at 01/09/16 0811 Last data filed at 01/09/16 0550  Gross per 24 hour  Intake            757.5 ml  Output              450 ml  Net            307.5 ml   Filed Weights   01/08/16 2045 01/08/16 2348  Weight: 81.6 kg (180 lb) 82.1 kg (181 lb)    Examination:  General exam: Appears calm and comfortable  Respiratory system: Clear to auscultation. Respiratory effort normal. Cardiovascular system: S1 & S2 heard, RRR. No JVD, murmurs, rubs, gallops or clicks. No pedal edema. Gastrointestinal system: Abdomen is nondistended, soft and nontender. No organomegaly or masses felt. Normal bowel sounds heard. Central nervous system: Alert and oriented. No focal neurological deficits. Extremities: Symmetric 5 x 5 power. Skin: No rashes, lesions or ulcers  Psychiatry: Judgement and insight appear normal. Mood & affect appropriate.     Data Reviewed: I have personally reviewed following labs and imaging studies  CBC:  Recent Labs Lab 01/08/16 2116  WBC 14.1*  HGB 13.3  HCT 39.7  MCV 93.9  PLT 0000000   Basic Metabolic Panel:  Recent Labs Lab 01/08/16 2116 01/09/16 0556  NA 136 136  K 3.4* 4.3  CL 97* 99*  CO2 29 31  GLUCOSE 125* 113*  BUN 8 9  CREATININE 0.81 0.83  CALCIUM 8.8* 8.5*  MG  --  2.4   GFR: Estimated Creatinine Clearance: 117.7 mL/min (by C-G formula based on SCr of 0.83 mg/dL). Liver Function Tests:  Recent Labs Lab 01/08/16 2116  AST 22  ALT 23  ALKPHOS 102  BILITOT 0.3  PROT 7.5  ALBUMIN 3.7   No results for  input(s): LIPASE, AMYLASE in the last 168 hours. No results for input(s): AMMONIA in the last 168 hours. Coagulation Profile: No results for input(s): INR, PROTIME in the last 168 hours. Cardiac Enzymes:  Recent Labs Lab 01/08/16 2116  TROPONINI <0.03   BNP (last 3 results) No results for input(s): PROBNP in the last 8760 hours. HbA1C: No results for input(s): HGBA1C in the last 72 hours. CBG: No results for input(s): GLUCAP in the last 168 hours. Lipid Profile: No results for input(s): CHOL, HDL, LDLCALC, TRIG, CHOLHDL, LDLDIRECT in the last 72 hours. Thyroid Function Tests: No results for input(s): TSH, T4TOTAL, FREET4, T3FREE, THYROIDAB in the last 72 hours. Anemia Panel: No results for input(s): VITAMINB12, FOLATE, FERRITIN, TIBC, IRON, RETICCTPCT in the last 72 hours. Sepsis Labs:  Recent Labs Lab 01/08/16 2116 01/09/16 0040  LATICACIDVEN 1.3 1.0    Recent Results (from the past 240 hour(s))  Blood Culture (routine x 2)     Status: None (Preliminary result)   Collection Time: 01/08/16  9:16 PM  Result Value Ref Range Status   Specimen Description BLOOD RIGHT HAND  Final   Special Requests BOTTLES DRAWN AEROBIC AND ANAEROBIC 4CC EACH  Final   Culture NO GROWTH < 12 HOURS  Final   Report Status PENDING  Incomplete  Blood Culture (routine x 2)     Status: None (Preliminary result)   Collection Time: 01/08/16 10:08 PM  Result Value Ref Range Status   Specimen Description BLOOD LEFT HAND  Final   Special Requests BOTTLES DRAWN AEROBIC ONLY 6CC  Final   Culture NO GROWTH < 12 HOURS  Final   Report Status PENDING  Incomplete  Culture, sputum-assessment     Status: None   Collection Time: 01/09/16  3:15 AM  Result Value Ref Range Status   Specimen Description SPUTUM  Final   Special Requests NONE  Final   Sputum evaluation   Final    MICROSCOPIC FINDINGS SUGGEST THAT THIS SPECIMEN IS NOT REPRESENTATIVE OF LOWER RESPIRATORY SECRETIONS. PLEASE RECOLLECT.   Report  Status 01/09/2016 FINAL  Final         Radiology Studies: Dg Chest 2 View  Result Date: 01/08/2016 CLINICAL DATA:  50 year old male with a history of cough EXAM: CHEST  2 VIEW COMPARISON:  09/26/2015, 09/17/2015 FINDINGS: Cardiomediastinal silhouette unchanged in size and contour. Interval development of mixed interstitial and airspace opacities of the bilateral lungs. Stigmata of emphysema, with increased retrosternal airspace, flattened hemidiaphragms, increased AP diameter, and hyperinflation on the AP view. No pneumothorax. Scarring of the bilateral lung apices. No pleural effusion. No displaced fracture. IMPRESSION: Multifocal pneumonia superimposed on  emphysema and chronic lung changes. Followup PA and lateral chest X-ray is recommended in 3-4 weeks following treatment to assure resolution. Signed, Dulcy Fanny. Earleen Newport, DO [facility] Radiology Electronically Signed   By: Corrie Mckusick D.O.   On: 01/08/2016 21:28        Scheduled Meds: . aspirin  325 mg Oral Daily  . azithromycin  500 mg Intravenous Q24H  . cefTRIAXone (ROCEPHIN)  IV  1 g Intravenous Q24H  . gabapentin  600 mg Oral TID  . Influenza vac split quadrivalent PF  0.5 mL Intramuscular Tomorrow-1000  . ipratropium-albuterol  3 mL Nebulization Q6H  . mometasone-formoterol  2 puff Inhalation BID  . morphine  30 mg Oral Q8H  . pneumococcal 23 valent vaccine  0.5 mL Intramuscular Tomorrow-1000  . rivaroxaban  10 mg Oral Q breakfast  . sodium chloride flush  3 mL Intravenous Q12H   Continuous Infusions: . sodium chloride 100 mL/hr at 01/09/16 0641     LOS: 1 day    Time spent: 25 minutes     Domingo Mend, MD [facility] If 7PM-7AM, please contact night-coverage [email or web address] Password [email or web address] 01/09/2016, 8:11 AM   By signing my name below, I, Collene Leyden, attest that this documentation has been prepared under the direction and in the presence of Domingo Mend MD. Electronically signed: Collene Leyden, Scribe. 01/09/16    I have reviewed the above documentation for accuracy and completeness, and I agree with the above.  Domingo Mend, MD [facility] Pager: [phone]

## 2016-01-09 NOTE — Progress Notes (Signed)
Pt. Was acting very lethargic and angry. Has a hx of drug seeking behaviors. The night nurse was concerned he might be self medicating with medications from home. Nursing staff checked his pants pockets and he has 4 different narcotics from home. Medications was counted and written on a pharmacy paper, awaiting pt signature and then medications will be sent to pharmacy. Pt denies taking any from home.

## 2016-01-09 NOTE — Progress Notes (Signed)
Wesley [name] for Renal Adjustment of ABX if needed ABX:  Rocephin and Zithromax  Allergies  Allergen Reactions  . Ambien [Zolpidem Tartrate] Other (See Comments)    hallucinations  . Melatonin Itching  . Zolpidem Other (See Comments)    Altered mental status   Patient Measurements: Height: 6\' 1"  (185.4 cm) Weight: 181 lb (82.1 kg) IBW/kg (Calculated) : 79.9  Vital Signs: Temp: 100.1 F (37.8 C) (11/04 2348) Temp Source: Oral (11/04 2348) BP: 102/47 (11/04 2348) Pulse Rate: 108 (11/04 2348)  Labs:  Recent Labs  01/08/16 2116  WBC 14.1*  HGB 13.3  PLT 261  CREATININE 0.81   No results for input(s): VANCOTROUGH, VANCOPEAK, VANCORANDOM, GENTTROUGH, GENTPEAK, GENTRANDOM, TOBRATROUGH, TOBRAPEAK, TOBRARND, AMIKACINPEAK, AMIKACINTROU, AMIKACIN in the last 72 hours.   Medical History: Past Medical History:  Diagnosis Date  . Avascular necrosis of femur ([place])   . Back pain   . COPD (chronic obstructive pulmonary disease) ([place])   . Neuropathy ([place])   . Osteoporosis   . Pulmonary asbestosis ([place])   . Tumor of lung    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, 2017   Assessment: 50yo male started on Rocephin and Zithromax.  No renal adjustment needed.  Estimated Creatinine Clearance: 120.6 mL/min (by C-G formula based on SCr of 0.81 mg/dL).  Plan: Continue current Rx F/U to switch Zithromax to PO  Ena Dawley, [facility] 01/09/2016

## 2016-01-10 DIAGNOSIS — J181 Lobar pneumonia, unspecified organism: Secondary | ICD-10-CM

## 2016-01-10 LAB — EXPECTORATED SPUTUM ASSESSMENT W GRAM STAIN, RFLX TO RESP C

## 2016-01-10 LAB — HIV ANTIBODY (ROUTINE TESTING W REFLEX): HIV SCREEN 4TH GENERATION: NONREACTIVE

## 2016-01-10 MED ORDER — NICOTINE 21 MG/24HR TD PT24
21.0000 mg | MEDICATED_PATCH | Freq: Every day | TRANSDERMAL | Status: DC
  Administered 2016-01-10 – 2016-01-11 (×2): 21 mg via TRANSDERMAL
  Filled 2016-01-10 (×2): qty 1

## 2016-01-10 MED ORDER — GUAIFENESIN-DM 100-10 MG/5ML PO SYRP
5.0000 mL | ORAL_SOLUTION | ORAL | Status: DC | PRN
  Administered 2016-01-10 – 2016-01-11 (×3): 5 mL via ORAL
  Filled 2016-01-10 (×3): qty 5

## 2016-01-10 MED ORDER — ACETAMINOPHEN 325 MG PO TABS
650.0000 mg | ORAL_TABLET | Freq: Four times a day (QID) | ORAL | Status: DC | PRN
  Administered 2016-01-10: 650 mg via ORAL
  Filled 2016-01-10: qty 2

## 2016-01-10 NOTE — Progress Notes (Signed)
Pt complains of headache unrelieved by scheduled MS Contin and PRN Oxy IR and is requesting Tylenol or Ibuprofen.  Paged Dr Jerilee Hoh and verbal order given for Tylenol 650MG  every 6 Hrs as needed.

## 2016-01-10 NOTE — Progress Notes (Signed)
PROGRESS NOTE    Wesley [name]  [id number] DOB: August 26, 1965 DOA: 01/08/2016 PCP: [name], MD    Brief Narrative: 50 yom with a history of COPD on 3L of home oxygen, chronic back pain, pulmonary asbestosis, and lower extremity DVT on xarelto, presented with complaints of progressive dyspnea and cough. While in the ED, he was noted to be hypoxic and tachycardic. Serology unremarkable except for WBC 14.1. CXR consistent with pneumonia. He was started on IV fluids, supplement oxygen via nasal canula, and empiric antibiotics azithromycin and Rocephin. He was admitted for further evaluation of CAP.   Assessment & Plan:   Principal Problem:   CAP (community acquired pneumonia) Active Problems:   Acute-on-chronic respiratory failure (HCC)   Chronic pain   COPD exacerbation (HCC)   Hypokalemia   History of DVT (deep vein thrombosis)  CAP  - Presents with leukocytosis 14,100 and a productive cough.  - CXR is consistent with pneumonia.  - Will follow blood and sputum cultures.  - Continue VI fluid, pulmonary hygiene, and empirical antibiotics azithromycin and rocephin.  -Has been afebrile, seems improved today.  COPD exacerbation - On 3L of home oxygen  - Continue on nebs and empirical  antibiotics.  -No need for steroids at present.  Respiratory failure with hypoxia  - Secondary to COPD exacerbation/pneumonia  - She does require 3L of oxygen.  - Continue nebs and  supplemental oxygen.   Hypokalemia  - Replaced.   Hx of DVT  - He does have a history of lower extremity DVT.   - He is chronically anticoagulated on Xarelto, will continue.   Chronic pain  - She has chronic back pain due to degenerative disc disease and avascular necrosis of hip.  - Will continue MS contin and oxycodone.  -No IV narcotics to be given this admission.  DVT prophylaxis: Xarelto  Code Status: FULL  Family Communication: patient only Disposition Plan: Undetermined.    Consultants:    None   Procedures:   None   Antimicrobials:   Azithromycin 11/5 >>  Rocephin 11/5 >>   Subjective: Feels improved, altho still SOB. C/o pain 'all over"  Objective: Vitals:   01/10/16 0223 01/10/16 0300 01/10/16 0700 01/10/16 0725  BP:  126/62    Pulse:  100    Resp:  20    Temp:  98.5 F (36.9 C)    TempSrc:  Oral    SpO2: (!) 89% 92% 92% 98%  Weight:      Height:        Intake/Output Summary (Last 24 hours) at 01/10/16 1030 Last data filed at 01/09/16 1400  Gross per 24 hour  Intake                0 ml  Output              350 ml  Net             -350 ml   Filed Weights   01/08/16 2045 01/08/16 2348  Weight: 81.6 kg (180 lb) 82.1 kg (181 lb)    Examination:  General exam: Appears calm and comfortable  Respiratory system: Clear to auscultation. Respiratory effort normal. Cardiovascular system: S1 & S2 heard, RRR. No JVD, murmurs, rubs, gallops or clicks. No pedal edema. Gastrointestinal system: Abdomen is nondistended, soft and nontender. No organomegaly or masses felt. Normal bowel sounds heard. Central nervous system: Alert and oriented. No focal neurological deficits. Extremities: Symmetric 5 x 5 power. Skin: No  rashes, lesions or ulcers Psychiatry: Judgement and insight appear normal. Mood & affect appropriate.     Data Reviewed: I have personally reviewed following labs and imaging studies  CBC:  Recent Labs Lab 01/08/16 2116  WBC 14.1*  HGB 13.3  HCT 39.7  MCV 93.9  PLT 0000000   Basic Metabolic Panel:  Recent Labs Lab 01/08/16 2116 01/09/16 0556  NA 136 136  K 3.4* 4.3  CL 97* 99*  CO2 29 31  GLUCOSE 125* 113*  BUN 8 9  CREATININE 0.81 0.83  CALCIUM 8.8* 8.5*  MG  --  2.4   GFR: Estimated Creatinine Clearance: 117.7 mL/min (by C-G formula based on SCr of 0.83 mg/dL). Liver Function Tests:  Recent Labs Lab 01/08/16 2116  AST 22  ALT 23  ALKPHOS 102  BILITOT 0.3  PROT 7.5  ALBUMIN 3.7   No results for input(s):  LIPASE, AMYLASE in the last 168 hours. No results for input(s): AMMONIA in the last 168 hours. Coagulation Profile: No results for input(s): INR, PROTIME in the last 168 hours. Cardiac Enzymes:  Recent Labs Lab 01/08/16 2116  TROPONINI <0.03   BNP (last 3 results) No results for input(s): PROBNP in the last 8760 hours. HbA1C: No results for input(s): HGBA1C in the last 72 hours. CBG: No results for input(s): GLUCAP in the last 168 hours. Lipid Profile: No results for input(s): CHOL, HDL, LDLCALC, TRIG, CHOLHDL, LDLDIRECT in the last 72 hours. Thyroid Function Tests: No results for input(s): TSH, T4TOTAL, FREET4, T3FREE, THYROIDAB in the last 72 hours. Anemia Panel: No results for input(s): VITAMINB12, FOLATE, FERRITIN, TIBC, IRON, RETICCTPCT in the last 72 hours. Sepsis Labs:  Recent Labs Lab 01/08/16 2116 01/09/16 0040  LATICACIDVEN 1.3 1.0    Recent Results (from the past 240 hour(s))  Blood Culture (routine x 2)     Status: None (Preliminary result)   Collection Time: 01/08/16  9:16 PM  Result Value Ref Range Status   Specimen Description BLOOD RIGHT HAND  Final   Special Requests BOTTLES DRAWN AEROBIC AND ANAEROBIC 4CC EACH  Final   Culture NO GROWTH 2 DAYS  Final   Report Status PENDING  Incomplete  Blood Culture (routine x 2)     Status: None (Preliminary result)   Collection Time: 01/08/16 10:08 PM  Result Value Ref Range Status   Specimen Description BLOOD LEFT HAND  Final   Special Requests BOTTLES DRAWN AEROBIC ONLY 6CC  Final   Culture NO GROWTH 2 DAYS  Final   Report Status PENDING  Incomplete  Culture, sputum-assessment     Status: None   Collection Time: 01/09/16  3:15 AM  Result Value Ref Range Status   Specimen Description SPUTUM  Final   Special Requests NONE  Final   Sputum evaluation   Final    MICROSCOPIC FINDINGS SUGGEST THAT THIS SPECIMEN IS NOT REPRESENTATIVE OF LOWER RESPIRATORY SECRETIONS. PLEASE RECOLLECT.   Report Status 01/09/2016  FINAL  Final         Radiology Studies: Dg Chest 2 View  Result Date: 01/08/2016 CLINICAL DATA:  50 year old male with a history of cough EXAM: CHEST  2 VIEW COMPARISON:  09/26/2015, 09/17/2015 FINDINGS: Cardiomediastinal silhouette unchanged in size and contour. Interval development of mixed interstitial and airspace opacities of the bilateral lungs. Stigmata of emphysema, with increased retrosternal airspace, flattened hemidiaphragms, increased AP diameter, and hyperinflation on the AP view. No pneumothorax. Scarring of the bilateral lung apices. No pleural effusion. No displaced fracture. IMPRESSION: Multifocal pneumonia  superimposed on emphysema and chronic lung changes. Followup PA and lateral chest X-ray is recommended in 3-4 weeks following treatment to assure resolution. Signed, Dulcy Fanny. Earleen Newport, DO [facility] Electronically Signed   By: Corrie Mckusick D.O.   On: 01/08/2016 21:28        Scheduled Meds: . aspirin  325 mg Oral Daily  . azithromycin  500 mg Intravenous Q24H  . cefTRIAXone (ROCEPHIN)  IV  1 g Intravenous Q24H  . gabapentin  600 mg Oral TID  . ipratropium-albuterol  3 mL Nebulization Q6H  . mometasone-formoterol  2 puff Inhalation BID  . morphine  30 mg Oral Q8H  . rivaroxaban  10 mg Oral Q breakfast  . sodium chloride flush  3 mL Intravenous Q12H   Continuous Infusions:    LOS: 2 days    Time spent: 25 minutes     Domingo Mend, MD [facility] Pager: [phone] If 7PM-7AM, please contact night-coverage [email or web address] Password [email or web address] 01/10/2016, 10:30 AM

## 2016-01-10 NOTE — Care Management Note (Signed)
Case Management Note  Patient Details  Name: Wesley [name] MRN: [name] Date of Birth: Jul 19, 1965  Subjective/Objective:                  Pt admitted with CAP. He is from home, lives with spouse and is ind with ADL's. He has PCP, drives himself to appointments and has no difficulty affording medications. He has supplemental oxygen at home PTA through [facility]. He has neb machine. He has used [facility] in the past for [facility] services but is not active at this time. Pt plans to return home with self care.   Action/Plan: No CM needs anticipated.   Expected Discharge Date:    01/12/2016               Expected Discharge Plan:  Home/Self Care  In-House Referral:  NA  Discharge planning Services  CM Consult  Post Acute Care Choice:  NA Choice offered to:  NA  Status of Service:  Completed, signed off  Sherald Barge, RN 01/10/2016, 10:46 AM

## 2016-01-10 NOTE — Care Management Important Message (Signed)
Important Message  Patient Details  Name: RAFAEL COGAR MRN: [id number] Date of Birth: [birth year]   Medicare Important Message Given:  Yes    Sherald Barge, RN 01/10/2016, 10:45 AM

## 2016-01-11 LAB — BASIC METABOLIC PANEL
ANION GAP: 7 (ref 5–15)
BUN: 10 mg/dL (ref 6–20)
CALCIUM: 8.5 mg/dL — AB (ref 8.9–10.3)
CO2: 30 mmol/L (ref 22–32)
Chloride: 101 mmol/L (ref 101–111)
Creatinine, Ser: 0.84 mg/dL (ref 0.61–1.24)
GFR calc non Af Amer: >60 mL/min (ref >60)
GLUCOSE: 106 mg/dL — AB (ref 65–99)
POTASSIUM: 3.6 mmol/L (ref 3.5–5.1)
Sodium: 138 mmol/L (ref 135–145)

## 2016-01-11 LAB — CBC
HEMATOCRIT: 39.5 % (ref 39.0–52.0)
HEMOGLOBIN: 12.7 g/dL — AB (ref 13.0–17.0)
MCH: 30.2 pg (ref 26.0–34.0)
MCHC: 32.2 g/dL (ref 30.0–36.0)
MCV: 94 fL (ref 78.0–100.0)
Platelets: 303 10*3/uL (ref 150–400)
RBC: 4.2 MIL/uL — AB (ref 4.22–5.81)
RDW: 12.2 % (ref 11.5–15.5)
WBC: 7.7 10*3/uL (ref 4.0–10.5)

## 2016-01-11 MED ORDER — AMOXICILLIN-POT CLAVULANATE 875-125 MG PO TABS: 1.0000 | ORAL_TABLET | Freq: Two times a day (BID) | ORAL | 0 refills | Status: DC

## 2016-01-11 MED ORDER — AZITHROMYCIN 250 MG PO TABS: 500.0000 mg | ORAL_TABLET | Freq: Every day | ORAL | Status: DC

## 2016-01-11 MED ORDER — ALBUTEROL SULFATE HFA 108 (90 BASE) MCG/ACT IN AERS: 2.0000 | INHALATION_SPRAY | RESPIRATORY_TRACT | 12 refills | Status: DC | PRN

## 2016-01-11 NOTE — Progress Notes (Signed)
Pt IV removed, tolerated well.  Reviewed discharge instructions with pt and answered questions at this time.   

## 2016-01-11 NOTE — Progress Notes (Signed)
Antibiotic IV to Oral Route Change Policy  Assessment: This patient is receiving Zithromax by the intravenous route.  Based on criteria approved by the Pharmacy and Therapeutics Committee, the antibiotic(s) is/are being converted to the equivalent oral dose form(s).   DESCRIPTION: These criteria include:  Patient being treated for a respiratory tract infection, urinary tract infection, cellulitis or clostridium difficile associated diarrhea if on metronidazole  The patient is not neutropenic and does not exhibit a GI malabsorption state  The patient is eating (either orally or via tube) and/or has been taking other orally administered medications for a least 24 hours  The patient is improving clinically and has a Tmax < 100.5  If you have questions about this conversion, please contact the Pharmacy Department  []   [phone] )  Forestine Na Thanks, Isac Sarna, BS [place]ena [place], [place] Pharmacist Pager [phone]

## 2016-01-11 NOTE — Discharge Summary (Signed)
Physician Discharge Summary  Wesley [name] [id number] DOB: 1965-07-07 DOA: 01/08/2016  PCP: [name], MD  Admit date: 01/08/2016 Discharge date: 01/11/2016  Time spent: 45 minutes  Recommendations for Outpatient Follow-up:  -Will be discharged home today. -Advised to follow up with PCP in 2 weeks.   Discharge Diagnoses:  Principal Problem:   CAP (community acquired pneumonia) Active Problems:   Acute-on-chronic respiratory failure (HCC)   Chronic pain   COPD exacerbation (HCC)   Hypokalemia   History of DVT (deep vein thrombosis)   Discharge Condition: Stable and improved  Filed Weights   01/08/16 2045 01/08/16 2348  Weight: 81.6 kg (180 lb) 82.1 kg (181 lb)    History of present illness:  As per Dr. Myna Hidalgo on 11/4: Wesley [name] is a 50 y.o. male with medical history significant for COPD and asbestosis with chronic hypoxic respiratory failure, avascular hip necrosis and chronic pain, and history of lower extremity DVT on Xarelto, now presenting to the emergency department with progressive dyspnea and productive cough with fevers and chills. Patient uses 3 L/m of supplemental oxygen at baseline and had been in his usual state of health until he noted the insidious development of worsening chronic cough with increased sputum production and increased exertional dyspnea on 01/03/2016. Symptoms worsened and by 01/05/2016, patient had subjective fevers and chills and was dyspneic with minimal exertion. Subjective fevers, chills, production of thick yellow sputum, and dyspnea with minimal exertion have all persisted through the day today and have been seemingly unaffected by patient's increased use of his home neb therapies. He has not been on antibiotics recently and last treatment with steroids was in August. He denies any chest pain or palpitations and denies lower extremity edema or orthopnea. There has been no long distance travel or sick contacts.  ED Course: Upon  arrival to the ED, patient is found to have temperature of 37.9 C, saturating 89% on room air, tachycardic in the 120s, and with stable blood pressure. EKG demonstrates sinus tachycardia with rate 117 and chest x-ray is notable for multifocal pneumonia superimposed on emphysema and chronic lung disease changes. Chemistry panels notable for a potassium of 3.4 and CBC features a leukocytosis of 14,100. Lactic acid is reassuring at 1.3 and troponin is undetectable. Blood cultures were obtained in the emergency department. Patient was given a 1.5 L normal saline bolus, 1 gram of Tylenol, and started on empiric treatment with Rocephin and azithromycin. Tachycardia improved and O2 saturation normalized with supplemental oxygen via nasal cannula. Patient will be admitted to the telemetry unit for ongoing evaluation and management of community-acquired pneumonia with severe underlying chronic pulmonary disease.  Hospital Course:   CAP  - Presents with leukocytosis 14,100 and a productive cough.  - CXR is consistent with pneumonia.  - Cx data remains negative on DC. - Will be transitioned over to augmentin for 8 days on DC. -Has been afebrile, seems improved today.  COPD exacerbation - On 3L of home oxygen  - Per patient is back at baseline. - DC on inhalers, nebs and steroid taper  Acute on Chronic Respiratory failure with hypoxia  - Secondary to COPD exacerbation/pneumonia   Hypokalemia  - Replaced.   Hx of DVT  - He does have a history of lower extremity DVT.   - He is chronically anticoagulated on Xarelto, will continue.   Chronic pain  - She has chronic back pain due to degenerative disc disease and avascular necrosis of hip.  -  Will continue MS contin and oxycodone.  -No IV narcotics to be given this admission.   Procedures:  None   Consultations:  None  Discharge Instructions  Discharge Instructions    Diet - low sodium heart healthy    Complete by:  As directed     Increase activity slowly    Complete by:  As directed        Medication List    STOP taking these medications   predniSONE 20 MG tablet Commonly known as:  DELTASONE     TAKE these medications   ADVAIR DISKUS 250-50 MCG/DOSE Aepb Generic drug:  Fluticasone-Salmeterol Inhale 1 puff into the lungs 2 (two) times daily.   albuterol (2.5 MG/3ML) 0.083% nebulizer solution Commonly known as:  PROVENTIL Take 3 mLs (2.5 mg total) by nebulization every 6 (six) hours as needed for wheezing or shortness of breath.   albuterol 108 (90 Base) MCG/ACT inhaler Commonly known as:  PROVENTIL HFA;VENTOLIN HFA Inhale 2 puffs into the lungs every 4 (four) hours as needed for wheezing or shortness of breath. Shortness of breath   amoxicillin-clavulanate 875-125 MG tablet Commonly known as:  AUGMENTIN Take 1 tablet by mouth 2 (two) times daily.   aspirin 325 MG tablet Take 325 mg by mouth daily.   budesonide-formoterol 160-4.5 MCG/ACT inhaler Commonly known as:  SYMBICORT Inhale 3 puffs into the lungs 4 (four) times daily.   furosemide 40 MG tablet Commonly known as:  LASIX Take 40 mg by mouth daily.   gabapentin 300 MG capsule Commonly known as:  NEURONTIN Take 600 mg by mouth 3 (three) times daily.   methocarbamol 500 MG tablet Commonly known as:  ROBAXIN Take 1 tablet (500 mg total) by mouth every 6 (six) hours as needed for muscle spasms.   morphine 30 MG 12 hr tablet Commonly known as:  MS CONTIN Take 30 mg by mouth every 8 (eight) hours.   NARCAN 4 MG/0.1ML Liqd Generic drug:  naloxone HCl Place 4 mg into the nose as directed. To prevent overdose   ondansetron 4 MG disintegrating tablet Commonly known as:  ZOFRAN ODT Take 1 tablet (4 mg total) by mouth every 8 (eight) hours as needed for nausea or vomiting.   Oxycodone HCl 20 MG Tabs Take 1 tablet (20 mg total) by mouth 3 (three) times daily. What changed:  how much to take  when to take this  reasons to take this     rivaroxaban 10 MG Tabs tablet Commonly known as:  XARELTO Take 1 tablet (10 mg total) by mouth daily with breakfast.   SYSTANE 0.4-0.3 % Soln Generic drug:  Polyethyl Glycol-Propyl Glycol Place 1-2 drops into the left eye daily as needed (for dry eye relief).   tiotropium 18 MCG inhalation capsule Commonly known as:  SPIRIVA Place 1 capsule (18 mcg total) into inhaler and inhale daily.      Allergies  Allergen Reactions  . Ambien [Zolpidem Tartrate] Other (See Comments)    hallucinations  . Melatonin Itching  . Zolpidem Other (See Comments)    Altered mental status   Follow-up Information    [name], MD On 01/20/2016.   Specialty:  Internal Medicine Why:  at 4:00 pm Contact information: [address] [place] [phone]            The results of significant diagnostics from this hospitalization (including imaging, microbiology, ancillary and laboratory) are listed below for reference.    Significant Diagnostic Studies: Dg  Chest 2 View  Result Date: 01/08/2016 CLINICAL DATA:  50 year old male with a history of cough EXAM: CHEST  2 VIEW COMPARISON:  09/26/2015, 09/17/2015 FINDINGS: Cardiomediastinal silhouette unchanged in size and contour. Interval development of mixed interstitial and airspace opacities of the bilateral lungs. Stigmata of emphysema, with increased retrosternal airspace, flattened hemidiaphragms, increased AP diameter, and hyperinflation on the AP view. No pneumothorax. Scarring of the bilateral lung apices. No pleural effusion. No displaced fracture. IMPRESSION: Multifocal pneumonia superimposed on emphysema and chronic lung changes. Followup PA and lateral chest X-ray is recommended in 3-4 weeks following treatment to assure resolution. Signed, Dulcy Fanny. Earleen Newport, DO [facility] Electronically Signed   By: Corrie Mckusick D.O.   On: 01/08/2016 21:28    Microbiology: Recent  Results (from the past 240 hour(s))  Blood Culture (routine x 2)     Status: None (Preliminary result)   Collection Time: 01/08/16  9:16 PM  Result Value Ref Range Status   Specimen Description BLOOD RIGHT HAND  Final   Special Requests BOTTLES DRAWN AEROBIC AND ANAEROBIC 4CC EACH  Final   Culture NO GROWTH 3 DAYS  Final   Report Status PENDING  Incomplete  Blood Culture (routine x 2)     Status: None (Preliminary result)   Collection Time: 01/08/16 10:08 PM  Result Value Ref Range Status   Specimen Description BLOOD LEFT HAND  Final   Special Requests BOTTLES DRAWN AEROBIC ONLY 6CC  Final   Culture NO GROWTH 3 DAYS  Final   Report Status PENDING  Incomplete  Culture, sputum-assessment     Status: None   Collection Time: 01/09/16  3:15 AM  Result Value Ref Range Status   Specimen Description SPUTUM  Final   Special Requests NONE  Final   Sputum evaluation   Final    MICROSCOPIC FINDINGS SUGGEST THAT THIS SPECIMEN IS NOT REPRESENTATIVE OF LOWER RESPIRATORY SECRETIONS. PLEASE RECOLLECT.   Report Status 01/09/2016 FINAL  Final  Culture, expectorated sputum-assessment     Status: None   Collection Time: 01/09/16  4:07 AM  Result Value Ref Range Status   Specimen Description EXPECTORATED SPUTUM  Final   Special Requests Immunocompromised  Final   Sputum evaluation   Final    THIS SPECIMEN IS ACCEPTABLE. RESPIRATORY CULTURE REPORT TO FOLLOW. PERFORMED AT [facility]    Report Status 01/10/2016 FINAL  Final     Labs: Basic Metabolic Panel:  Recent Labs Lab 01/08/16 2116 01/09/16 0556 01/11/16 0622  NA 136 136 138  K 3.4* 4.3 3.6  CL 97* 99* 101  CO2 29 31 30   GLUCOSE 125* 113* 106*  BUN 8 9 10   CREATININE 0.81 0.83 0.84  CALCIUM 8.8* 8.5* 8.5*  MG  --  2.4  --    Liver Function Tests:  Recent Labs Lab 01/08/16 2116  AST 22  ALT 23  ALKPHOS 102  BILITOT 0.3  PROT 7.5  ALBUMIN 3.7   No results for input(s): LIPASE, AMYLASE in the last 168 hours. No results for  input(s): AMMONIA in the last 168 hours. CBC:  Recent Labs Lab 01/08/16 2116 01/11/16 0622  WBC 14.1* 7.7  HGB 13.3 12.7*  HCT 39.7 39.5  MCV 93.9 94.0  PLT 261 303   Cardiac Enzymes:  Recent Labs Lab 01/08/16 2116  TROPONINI <0.03   BNP: BNP (last 3 results) No results for input(s): BNP in the last 8760 hours.  ProBNP (last 3 results) No results for input(s): PROBNP  in the last 8760 hours.  CBG: No results for input(s): GLUCAP in the last 168 hours.     SignedLelon Frohlich  [facility] Pager: [phone] 01/11/2016, 1:44 PM

## 2016-01-11 NOTE — Progress Notes (Signed)
Home meds picked up from the pharmacy and delivered to pt.  Pt signed for meds and form placed on chart.

## 2016-01-13 LAB — CULTURE, RESPIRATORY: CULTURE: NORMAL

## 2016-01-13 LAB — CULTURE, BLOOD (ROUTINE X 2)
CULTURE: NO GROWTH
Culture: NO GROWTH

## 2016-01-13 LAB — CULTURE, RESPIRATORY W GRAM STAIN

## 2016-03-16 DIAGNOSIS — G8921 Chronic pain due to trauma: Secondary | ICD-10-CM | POA: Diagnosis not present

## 2016-03-16 DIAGNOSIS — G8922 Chronic post-thoracotomy pain: Secondary | ICD-10-CM | POA: Diagnosis not present

## 2016-03-16 DIAGNOSIS — G8929 Other chronic pain: Secondary | ICD-10-CM | POA: Diagnosis not present

## 2016-03-16 DIAGNOSIS — Z5181 Encounter for therapeutic drug level monitoring: Secondary | ICD-10-CM | POA: Diagnosis not present

## 2016-03-16 DIAGNOSIS — M25551 Pain in right hip: Secondary | ICD-10-CM | POA: Diagnosis not present

## 2016-03-16 DIAGNOSIS — Z79899 Other long term (current) drug therapy: Secondary | ICD-10-CM | POA: Diagnosis not present

## 2016-03-16 DIAGNOSIS — M25561 Pain in right knee: Secondary | ICD-10-CM | POA: Diagnosis not present

## 2016-03-16 DIAGNOSIS — Z79891 Long term (current) use of opiate analgesic: Secondary | ICD-10-CM | POA: Diagnosis not present

## 2016-03-30 ENCOUNTER — Emergency Department (HOSPITAL_COMMUNITY): Payer: Medicare HMO

## 2016-03-30 ENCOUNTER — Inpatient Hospital Stay (HOSPITAL_COMMUNITY)
Admission: EM | Admit: 2016-03-30 | Discharge: 2016-04-03 | DRG: 871 | Disposition: A | Discharge status: Home Health | Payer: Medicare HMO | Attending: Internal Medicine | Admitting: Internal Medicine

## 2016-03-30 ENCOUNTER — Encounter (HOSPITAL_COMMUNITY): Payer: Self-pay | Admitting: Emergency Medicine

## 2016-03-30 DIAGNOSIS — R69 Illness, unspecified: Secondary | ICD-10-CM | POA: Diagnosis not present

## 2016-03-30 DIAGNOSIS — R05 Cough: Secondary | ICD-10-CM | POA: Diagnosis not present

## 2016-03-30 DIAGNOSIS — I279 Pulmonary heart disease, unspecified: Secondary | ICD-10-CM | POA: Diagnosis not present

## 2016-03-30 DIAGNOSIS — J9620 Acute and chronic respiratory failure, unspecified whether with hypoxia or hypercapnia: Secondary | ICD-10-CM | POA: Diagnosis present

## 2016-03-30 DIAGNOSIS — F1120 Opioid dependence, uncomplicated: Secondary | ICD-10-CM | POA: Diagnosis present

## 2016-03-30 DIAGNOSIS — J9621 Acute and chronic respiratory failure with hypoxia: Secondary | ICD-10-CM | POA: Diagnosis not present

## 2016-03-30 DIAGNOSIS — Z86718 Personal history of other venous thrombosis and embolism: Secondary | ICD-10-CM

## 2016-03-30 DIAGNOSIS — R51 Headache: Secondary | ICD-10-CM | POA: Diagnosis not present

## 2016-03-30 DIAGNOSIS — G9340 Encephalopathy, unspecified: Secondary | ICD-10-CM | POA: Diagnosis not present

## 2016-03-30 DIAGNOSIS — J440 Chronic obstructive pulmonary disease with acute lower respiratory infection: Secondary | ICD-10-CM | POA: Diagnosis not present

## 2016-03-30 DIAGNOSIS — Z7951 Long term (current) use of inhaled steroids: Secondary | ICD-10-CM | POA: Diagnosis not present

## 2016-03-30 DIAGNOSIS — R911 Solitary pulmonary nodule: Secondary | ICD-10-CM | POA: Diagnosis present

## 2016-03-30 DIAGNOSIS — Z8249 Family history of ischemic heart disease and other diseases of the circulatory system: Secondary | ICD-10-CM

## 2016-03-30 DIAGNOSIS — G8929 Other chronic pain: Secondary | ICD-10-CM | POA: Diagnosis not present

## 2016-03-30 DIAGNOSIS — Z96641 Presence of right artificial hip joint: Secondary | ICD-10-CM | POA: Diagnosis present

## 2016-03-30 DIAGNOSIS — J189 Pneumonia, unspecified organism: Secondary | ICD-10-CM | POA: Diagnosis present

## 2016-03-30 DIAGNOSIS — Z87891 Personal history of nicotine dependence: Secondary | ICD-10-CM | POA: Diagnosis not present

## 2016-03-30 DIAGNOSIS — Y95 Nosocomial condition: Secondary | ICD-10-CM | POA: Diagnosis present

## 2016-03-30 DIAGNOSIS — R079 Chest pain, unspecified: Secondary | ICD-10-CM | POA: Diagnosis not present

## 2016-03-30 DIAGNOSIS — Z79899 Other long term (current) drug therapy: Secondary | ICD-10-CM

## 2016-03-30 DIAGNOSIS — Z9981 Dependence on supplemental oxygen: Secondary | ICD-10-CM

## 2016-03-30 DIAGNOSIS — Z7982 Long term (current) use of aspirin: Secondary | ICD-10-CM | POA: Diagnosis not present

## 2016-03-30 DIAGNOSIS — M810 Age-related osteoporosis without current pathological fracture: Secondary | ICD-10-CM | POA: Diagnosis not present

## 2016-03-30 DIAGNOSIS — R0602 Shortness of breath: Secondary | ICD-10-CM | POA: Diagnosis not present

## 2016-03-30 DIAGNOSIS — A419 Sepsis, unspecified organism: Secondary | ICD-10-CM | POA: Diagnosis not present

## 2016-03-30 DIAGNOSIS — J441 Chronic obstructive pulmonary disease with (acute) exacerbation: Secondary | ICD-10-CM | POA: Diagnosis not present

## 2016-03-30 DIAGNOSIS — M6281 Muscle weakness (generalized): Secondary | ICD-10-CM

## 2016-03-30 DIAGNOSIS — G8921 Chronic pain due to trauma: Secondary | ICD-10-CM | POA: Diagnosis not present

## 2016-03-30 DIAGNOSIS — R509 Fever, unspecified: Secondary | ICD-10-CM | POA: Diagnosis not present

## 2016-03-30 DIAGNOSIS — Z833 Family history of diabetes mellitus: Secondary | ICD-10-CM | POA: Diagnosis not present

## 2016-03-30 DIAGNOSIS — R269 Unspecified abnormalities of gait and mobility: Secondary | ICD-10-CM | POA: Diagnosis not present

## 2016-03-30 LAB — PROTIME-INR
INR: 1.02
Prothrombin Time: 13.4 seconds (ref 11.4–15.2)

## 2016-03-30 LAB — CBC WITH DIFFERENTIAL/PLATELET
BASOS PCT: 1 %
Basophils Absolute: 0.1 10*3/uL (ref 0.0–0.1)
EOS ABS: 0.1 10*3/uL (ref 0.0–0.7)
Eosinophils Relative: 0 %
HCT: 41.6 % (ref 39.0–52.0)
Hemoglobin: 13.8 g/dL (ref 13.0–17.0)
Lymphocytes Relative: 7 %
Lymphs Abs: 1.1 10*3/uL (ref 0.7–4.0)
MCH: 30.9 pg (ref 26.0–34.0)
MCHC: 33.2 g/dL (ref 30.0–36.0)
MCV: 93.3 fL (ref 78.0–100.0)
Monocytes Absolute: 2.9 10*3/uL — ABNORMAL HIGH (ref 0.1–1.0)
Monocytes Relative: 19 %
Neutro Abs: 11.3 10*3/uL — ABNORMAL HIGH (ref 1.7–7.7)
Neutrophils Relative %: 73 %
PLATELETS: 365 10*3/uL (ref 150–400)
RBC: 4.46 MIL/uL (ref 4.22–5.81)
RDW: 13.2 % (ref 11.5–15.5)
WBC: 15.9 10*3/uL — AB (ref 4.0–10.5)

## 2016-03-30 LAB — CBC
HEMATOCRIT: 40.5 % (ref 39.0–52.0)
HEMOGLOBIN: 13.5 g/dL (ref 13.0–17.0)
MCH: 30.7 pg (ref 26.0–34.0)
MCHC: 33.3 g/dL (ref 30.0–36.0)
MCV: 92 fL (ref 78.0–100.0)
PLATELETS: 352 10*3/uL (ref 150–400)
RBC: 4.4 MIL/uL (ref 4.22–5.81)
RDW: 13.2 % (ref 11.5–15.5)
WBC: 15.2 10*3/uL — AB (ref 4.0–10.5)

## 2016-03-30 LAB — URINALYSIS, ROUTINE W REFLEX MICROSCOPIC
Bilirubin Urine: NEGATIVE
Glucose, UA: NEGATIVE mg/dL
Hgb urine dipstick: NEGATIVE
KETONES UR: NEGATIVE mg/dL
LEUKOCYTES UA: NEGATIVE
Nitrite: NEGATIVE
PROTEIN: 100 mg/dL — AB
Specific Gravity, Urine: 1.026 (ref 1.005–1.030)
pH: 5 (ref 5.0–8.0)

## 2016-03-30 LAB — BASIC METABOLIC PANEL
Anion gap: 11 (ref 5–15)
BUN: 19 mg/dL (ref 6–20)
CHLORIDE: 90 mmol/L — AB (ref 101–111)
CO2: 34 mmol/L — AB (ref 22–32)
Calcium: 9.1 mg/dL (ref 8.9–10.3)
Creatinine, Ser: 0.95 mg/dL (ref 0.61–1.24)
GFR calc non Af Amer: >60 mL/min (ref >60)
Glucose, Bld: 143 mg/dL — ABNORMAL HIGH (ref 65–99)
Potassium: 3.2 mmol/L — ABNORMAL LOW (ref 3.5–5.1)
SODIUM: 135 mmol/L (ref 135–145)

## 2016-03-30 LAB — COMPREHENSIVE METABOLIC PANEL
ALBUMIN: 2.3 g/dL — AB (ref 3.5–5.0)
ALT: 42 U/L (ref 17–63)
AST: 40 U/L (ref 15–41)
Alkaline Phosphatase: 104 U/L (ref 38–126)
Anion gap: 7 (ref 5–15)
BUN: 15 mg/dL (ref 6–20)
CHLORIDE: 99 mmol/L — AB (ref 101–111)
CO2: 29 mmol/L (ref 22–32)
Calcium: 7.5 mg/dL — ABNORMAL LOW (ref 8.9–10.3)
Creatinine, Ser: 0.83 mg/dL (ref 0.61–1.24)
GFR calc Af Amer: >60 mL/min (ref >60)
GFR calc non Af Amer: >60 mL/min (ref >60)
GLUCOSE: 138 mg/dL — AB (ref 65–99)
POTASSIUM: 3.6 mmol/L (ref 3.5–5.1)
SODIUM: 135 mmol/L (ref 135–145)
Total Bilirubin: 0.4 mg/dL (ref 0.3–1.2)
Total Protein: 5.4 g/dL — ABNORMAL LOW (ref 6.5–8.1)

## 2016-03-30 LAB — INFLUENZA PANEL BY PCR (TYPE A & B)
INFLBPCR: NEGATIVE
Influenza A By PCR: NEGATIVE

## 2016-03-30 LAB — LACTIC ACID, PLASMA
LACTIC ACID, VENOUS: 1 mmol/L (ref 0.5–1.9)
Lactic Acid, Venous: 0.8 mmol/L (ref 0.5–1.9)

## 2016-03-30 LAB — LIPASE, BLOOD: Lipase: 12 U/L (ref 11–51)

## 2016-03-30 LAB — HEPATIC FUNCTION PANEL
ALBUMIN: 3.1 g/dL — AB (ref 3.5–5.0)
ALK PHOS: 134 U/L — AB (ref 38–126)
ALT: 52 U/L (ref 17–63)
AST: 42 U/L — AB (ref 15–41)
BILIRUBIN DIRECT: 0.2 mg/dL (ref 0.1–0.5)
BILIRUBIN TOTAL: 0.6 mg/dL (ref 0.3–1.2)
Indirect Bilirubin: 0.4 mg/dL (ref 0.3–0.9)
Total Protein: 7.3 g/dL (ref 6.5–8.1)

## 2016-03-30 LAB — I-STAT TROPONIN, ED: TROPONIN I, POC: 0.06 ng/mL (ref 0.00–0.08)

## 2016-03-30 LAB — TROPONIN I
TROPONIN I: 0.04 ng/mL — AB (ref <0.03)
Troponin I: 0.05 ng/mL (ref <0.03)

## 2016-03-30 LAB — APTT: aPTT: 43 seconds — ABNORMAL HIGH (ref 24–36)

## 2016-03-30 MED ORDER — GABAPENTIN 300 MG PO CAPS
600.0000 mg | ORAL_CAPSULE | Freq: Three times a day (TID) | ORAL | Status: DC
  Administered 2016-03-30 – 2016-04-03 (×12): 600 mg via ORAL
  Filled 2016-03-30 (×12): qty 2

## 2016-03-30 MED ORDER — POLYVINYL ALCOHOL 1.4 % OP SOLN
1.0000 [drp] | OPHTHALMIC | Status: DC | PRN
  Filled 2016-03-30: qty 15

## 2016-03-30 MED ORDER — ASPIRIN 81 MG PO CHEW
324.0000 mg | CHEWABLE_TABLET | Freq: Once | ORAL | Status: AC
  Administered 2016-03-30: 324 mg via ORAL
  Filled 2016-03-30: qty 4

## 2016-03-30 MED ORDER — PIPERACILLIN-TAZOBACTAM 3.375 G IVPB
3.3750 g | Freq: Three times a day (TID) | INTRAVENOUS | Status: DC
  Administered 2016-03-31 – 2016-04-03 (×11): 3.375 g via INTRAVENOUS
  Filled 2016-03-30 (×17): qty 50

## 2016-03-30 MED ORDER — ALBUTEROL SULFATE (2.5 MG/3ML) 0.083% IN NEBU
3.0000 mL | INHALATION_SOLUTION | RESPIRATORY_TRACT | Status: DC | PRN
  Administered 2016-03-31: 3 mL via RESPIRATORY_TRACT
  Filled 2016-03-30: qty 3

## 2016-03-30 MED ORDER — SODIUM CHLORIDE 0.9 % IV BOLUS (SEPSIS)
1000.0000 mL | Freq: Once | INTRAVENOUS | Status: AC
  Administered 2016-03-30: 1000 mL via INTRAVENOUS

## 2016-03-30 MED ORDER — OXYCODONE HCL 5 MG PO TABS
30.0000 mg | ORAL_TABLET | ORAL | Status: DC
  Administered 2016-03-30 – 2016-04-03 (×22): 30 mg via ORAL
  Filled 2016-03-30 (×23): qty 6

## 2016-03-30 MED ORDER — AZITHROMYCIN 500 MG IV SOLR
500.0000 mg | Freq: Once | INTRAVENOUS | Status: AC
  Administered 2016-03-30: 500 mg via INTRAVENOUS
  Filled 2016-03-30: qty 500

## 2016-03-30 MED ORDER — NALOXONE HCL 4 MG/0.1ML NA LIQD: 4.0000 mg | NASAL | Status: DC

## 2016-03-30 MED ORDER — ONDANSETRON 4 MG PO TBDP
4.0000 mg | ORAL_TABLET | Freq: Three times a day (TID) | ORAL | Status: DC | PRN
  Administered 2016-03-31 – 2016-04-02 (×3): 4 mg via ORAL
  Filled 2016-03-30 (×5): qty 1

## 2016-03-30 MED ORDER — ALBUTEROL (5 MG/ML) CONTINUOUS INHALATION SOLN
INHALATION_SOLUTION | RESPIRATORY_TRACT | Status: AC
  Administered 2016-03-30: 15 mg
  Filled 2016-03-30: qty 20

## 2016-03-30 MED ORDER — POLYETHYL GLYCOL-PROPYL GLYCOL 0.4-0.3 % OP SOLN: 1.0000 [drp] | Freq: Every day | OPHTHALMIC | Status: DC | PRN

## 2016-03-30 MED ORDER — NICOTINE 21 MG/24HR TD PT24
21.0000 mg | MEDICATED_PATCH | Freq: Every day | TRANSDERMAL | Status: DC
  Administered 2016-03-31 – 2016-04-03 (×4): 21 mg via TRANSDERMAL
  Filled 2016-03-30 (×4): qty 1

## 2016-03-30 MED ORDER — TIOTROPIUM BROMIDE MONOHYDRATE 18 MCG IN CAPS
18.0000 ug | ORAL_CAPSULE | Freq: Every day | RESPIRATORY_TRACT | Status: DC
  Administered 2016-03-31 – 2016-04-03 (×4): 18 ug via RESPIRATORY_TRACT
  Filled 2016-03-30: qty 5

## 2016-03-30 MED ORDER — METHOCARBAMOL 500 MG PO TABS
500.0000 mg | ORAL_TABLET | Freq: Four times a day (QID) | ORAL | Status: DC | PRN
  Administered 2016-03-31: 500 mg via ORAL
  Filled 2016-03-30: qty 1

## 2016-03-30 MED ORDER — METHYLPREDNISOLONE SODIUM SUCC 125 MG IJ SOLR
125.0000 mg | Freq: Once | INTRAMUSCULAR | Status: AC
  Administered 2016-03-30: 125 mg via INTRAVENOUS
  Filled 2016-03-30: qty 2

## 2016-03-30 MED ORDER — SODIUM CHLORIDE 0.9 % IV BOLUS (SEPSIS)
1500.0000 mL | Freq: Once | INTRAVENOUS | Status: AC
  Administered 2016-03-30: 1500 mL via INTRAVENOUS

## 2016-03-30 MED ORDER — DEXTROSE 5 % IV SOLN
1.0000 g | Freq: Once | INTRAVENOUS | Status: AC
  Administered 2016-03-30: 1 g via INTRAVENOUS
  Filled 2016-03-30: qty 10

## 2016-03-30 MED ORDER — OXYCODONE HCL 5 MG PO TABS
20.0000 mg | ORAL_TABLET | Freq: Once | ORAL | Status: AC
  Administered 2016-03-30: 20 mg via ORAL
  Filled 2016-03-30: qty 4

## 2016-03-30 MED ORDER — ALBUTEROL SULFATE (2.5 MG/3ML) 0.083% IN NEBU
2.5000 mg | INHALATION_SOLUTION | Freq: Once | RESPIRATORY_TRACT | Status: AC
  Administered 2016-03-30: 2.5 mg via RESPIRATORY_TRACT
  Filled 2016-03-30: qty 3

## 2016-03-30 MED ORDER — ASPIRIN 325 MG PO TABS
325.0000 mg | ORAL_TABLET | Freq: Every day | ORAL | Status: DC
  Administered 2016-03-31 – 2016-04-03 (×4): 325 mg via ORAL
  Filled 2016-03-30 (×4): qty 1

## 2016-03-30 MED ORDER — ENOXAPARIN SODIUM 40 MG/0.4ML ~~LOC~~ SOLN
40.0000 mg | SUBCUTANEOUS | Status: DC
  Administered 2016-03-30 – 2016-04-02 (×4): 40 mg via SUBCUTANEOUS
  Filled 2016-03-30 (×4): qty 0.4

## 2016-03-30 MED ORDER — PIPERACILLIN-TAZOBACTAM 3.375 G IVPB 30 MIN
3.3750 g | Freq: Once | INTRAVENOUS | Status: AC
  Administered 2016-03-30: 3.375 g via INTRAVENOUS
  Filled 2016-03-30: qty 50

## 2016-03-30 MED ORDER — VANCOMYCIN HCL IN DEXTROSE 1-5 GM/200ML-% IV SOLN
1000.0000 mg | Freq: Three times a day (TID) | INTRAVENOUS | Status: DC
Start: 2016-03-30 — End: 2016-03-30

## 2016-03-30 MED ORDER — METHYLPREDNISOLONE SODIUM SUCC 125 MG IJ SOLR
80.0000 mg | Freq: Two times a day (BID) | INTRAMUSCULAR | Status: DC
  Administered 2016-03-31 – 2016-04-01 (×3): 80 mg via INTRAVENOUS
  Filled 2016-03-30 (×3): qty 2

## 2016-03-30 MED ORDER — IPRATROPIUM BROMIDE 0.02 % IN SOLN
0.5000 mg | RESPIRATORY_TRACT | Status: DC | PRN
  Administered 2016-04-02: 0.5 mg via RESPIRATORY_TRACT
  Filled 2016-03-30: qty 2.5

## 2016-03-30 MED ORDER — IPRATROPIUM BROMIDE 0.02 % IN SOLN
RESPIRATORY_TRACT | Status: AC
  Administered 2016-03-30: 0.5 mg
  Filled 2016-03-30: qty 2.5

## 2016-03-30 MED ORDER — BENZONATATE 100 MG PO CAPS
100.0000 mg | ORAL_CAPSULE | Freq: Once | ORAL | Status: AC
  Administered 2016-03-30: 100 mg via ORAL
  Filled 2016-03-30: qty 1

## 2016-03-30 MED ORDER — ONDANSETRON 4 MG PO TBDP
4.0000 mg | ORAL_TABLET | Freq: Once | ORAL | Status: AC
  Administered 2016-03-30: 4 mg via ORAL
  Filled 2016-03-30: qty 1

## 2016-03-30 MED ORDER — OXYCODONE HCL ER 15 MG PO T12A
30.0000 mg | EXTENDED_RELEASE_TABLET | Freq: Three times a day (TID) | ORAL | Status: DC
  Administered 2016-03-30 – 2016-04-03 (×12): 30 mg via ORAL
  Filled 2016-03-30 (×12): qty 2

## 2016-03-30 MED ORDER — ALBUTEROL SULFATE (2.5 MG/3ML) 0.083% IN NEBU
2.5000 mg | INHALATION_SOLUTION | RESPIRATORY_TRACT | Status: DC | PRN
  Administered 2016-03-31 – 2016-04-02 (×4): 2.5 mg via RESPIRATORY_TRACT
  Filled 2016-03-30 (×4): qty 3

## 2016-03-30 MED ORDER — POTASSIUM CHLORIDE CRYS ER 20 MEQ PO TBCR
40.0000 meq | EXTENDED_RELEASE_TABLET | Freq: Once | ORAL | Status: AC
  Administered 2016-03-30: 40 meq via ORAL
  Filled 2016-03-30: qty 2

## 2016-03-30 MED ORDER — IPRATROPIUM-ALBUTEROL 0.5-2.5 (3) MG/3ML IN SOLN
3.0000 mL | Freq: Once | RESPIRATORY_TRACT | Status: AC
  Administered 2016-03-30: 3 mL via RESPIRATORY_TRACT
  Filled 2016-03-30: qty 3

## 2016-03-30 MED ORDER — SODIUM CHLORIDE 0.9 % IV BOLUS (SEPSIS)
500.0000 mL | Freq: Once | INTRAVENOUS | Status: AC
  Administered 2016-03-30: 500 mL via INTRAVENOUS

## 2016-03-30 NOTE — H&P (Signed)
History and Physical    Wesley [name] [id number] DOB: 03/13/1965 DOA: 03/30/2016  PCP: [name], MD  Patient coming from: home  Chief Complaint:  Cough, sob, fever, wheezing  HPI: Wesley [name] is a 51 y.o. male with medical history significant of advanced COPD on 3 liters [place] oxygen at home, chronic pain managed at [facility] pain management, post op DVT in 8/17 treated with xaralto now off,  Avascular necrosis of hip s/p repair, pulmonary asbestosis , also with CAP in 11/17 treated with azithro/rocephin as inpatient transitioned to augmentin as outpatient comes in with several days of worsening sob, wheezing and productive cough.  Pt reports his sob began about a week ago and he was wheezing much more, he doubled his neb treatments.  This helped some.  In the last 2 or so days he has been having subjective fever and his wheezing and sob got much worse.  He has vomited several times from the coughing.  The cough is productive but nonbloody.  He has not been eating or drinking well.  He has not been on steriods recently.  He has been having chest pain associated with coughing, no le edema or swelling or pain.  Pt referred for admission after found to have pna and hypotension in the ED, his bp has improved with ivf in the ED and his breathing has also gotten much better with several nebs in the ED.     Review of Systems: As per HPI otherwise 10 point review of systems negative.   Past Medical History:  Diagnosis Date  . Avascular necrosis of femur ([place])   . Back pain   . COPD (chronic obstructive pulmonary disease) ([place])   . Neuropathy ([place])   . Osteoporosis   . Pulmonary asbestosis ([place])   . 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 BACK SURGERY    . EYE SURGERY     left eye cornea and lens replaced- blind in left eye  . JOINT REPLACEMENT    . LUNG SURGERY    . TOTAL HIP ARTHROPLASTY  Right 09/24/2015   Procedure: TOTAL HIP ARTHROPLASTY ANTERIOR APPROACH;  Surgeon: Gaynelle Arabian, MD;  Location: [facility];  Service: Orthopedics;  Laterality: Right;     reports that he quit smoking about 4 years ago. He smoked 1.00 pack per day. He has never used smokeless tobacco. He reports that he does not drink alcohol or use drugs.  Allergies  Allergen Reactions  . Ambien [Zolpidem Tartrate] Other (See Comments)    hallucinations  . Melatonin Itching  . Zolpidem Other (See Comments)    Altered mental status    Family History  Problem Relation Age of Onset  . Heart failure Father   . Diabetes Father   . Diabetes Mother   . Cancer Mother     male cancer    Prior to Admission medications   Medication Sig Start Date End Date Taking? Authorizing Provider  ADVAIR DISKUS 250-50 MCG/DOSE AEPB Inhale 1 puff into the lungs 2 (two) times daily. 06/14/15  Yes Historical Provider, MD  albuterol (PROVENTIL HFA;VENTOLIN HFA) 108 (90 Base) MCG/ACT inhaler Inhale 2 puffs into the lungs every 4 (four) hours as needed for wheezing or shortness of breath. Shortness of breath 01/11/16  Yes Estela Leonie Green, MD  albuterol (PROVENTIL) (2.5 MG/3ML) 0.083% nebulizer solution Take 3  mLs (2.5 mg total) by nebulization every 6 (six) hours as needed for wheezing or shortness of breath. Patient taking differently: Take 2.5 mg by nebulization every 4 (four) hours as needed for wheezing or shortness of breath.  02/27/14  Yes Kathie Dike, MD  aspirin 325 MG tablet Take 325 mg by mouth daily.   Yes Historical Provider, MD  budesonide-formoterol (SYMBICORT) 160-4.5 MCG/ACT inhaler Inhale 3 puffs into the lungs 4 (four) times daily.   Yes Historical Provider, MD  furosemide (LASIX) 40 MG tablet Take 40 mg by mouth daily.   Yes Historical Provider, MD  gabapentin (NEURONTIN) 300 MG capsule Take 600 mg by mouth 3 (three) times daily.    Yes Historical Provider, MD  methocarbamol (ROBAXIN) 500 MG tablet  Take 1 tablet (500 mg total) by mouth every 6 (six) hours as needed for muscle spasms. 09/24/15  Yes Gaynelle Arabian, MD  naloxone HCl (NARCAN) 4 MG/0.1ML LIQD Place 4 mg into the nose as directed. To prevent overdose   Yes Historical Provider, MD  nicotine (NICODERM CQ - DOSED IN MG/24 HOURS) 21 mg/24hr patch Place 21 mg onto the skin daily.   Yes Historical Provider, MD  ondansetron (ZOFRAN ODT) 4 MG disintegrating tablet Take 1 tablet (4 mg total) by mouth every 8 (eight) hours as needed for nausea or vomiting. 05/24/14  Yes Samuella Cota, MD  oxyCODONE (OXYCONTIN) 30 MG 12 hr tablet Take 30 mg by mouth every 8 (eight) hours.   Yes Historical Provider, MD  oxycodone (ROXICODONE) 30 MG immediate release tablet Take 30 mg by mouth every 4 (four) hours.   Yes Historical Provider, MD  OXYGEN Inhale 3 L into the lungs daily as needed.   Yes Historical Provider, MD  Polyethyl Glycol-Propyl Glycol (SYSTANE) 0.4-0.3 % SOLN Place 1-2 drops into the left eye daily as needed (for dry eye relief).    Yes Historical Provider, MD  tiotropium (SPIRIVA) 18 MCG inhalation capsule Place 1 capsule (18 mcg total) into inhaler and inhale daily. 07/24/11  Yes Nimish Luther Parody, MD  amoxicillin-clavulanate (AUGMENTIN) 875-125 MG tablet Take 1 tablet by mouth 2 (two) times daily. Patient not taking: Reported on 03/30/2016 01/11/16   Erline Hau, MD  Oxycodone HCl 20 MG TABS Take 1 tablet (20 mg total) by mouth 3 (three) times daily. Patient not taking: Reported on 03/30/2016 10/15/15   Tanna Furry, MD    Physical Exam: Vitals:   03/30/16 2115 03/30/16 2130 03/30/16 2200 03/30/16 2230  BP:  (!) 90/48 105/88 (!) 123/102  Pulse: 109 105 93   Resp: 20 14 19    Temp:      TempSrc:      SpO2: 96% 96% 96%   Weight:      Height:        Constitutional: NAD, calm, comfortable Vitals:   03/30/16 2115 03/30/16 2130 03/30/16 2200 03/30/16 2230  BP:  (!) 90/48 105/88 (!) 123/102  Pulse: 109 105 93   Resp: 20 14  19    Temp:      TempSrc:      SpO2: 96% 96% 96%   Weight:      Height:       Eyes: PERRL, lids and conjunctivae normal ENMT: Mucous membranes are moist. Posterior pharynx clear of any exudate or lesions.Normal dentition.  Neck: normal, supple, no masses, no thyromegaly Respiratory: clear to auscultation bilaterally, diffuse wheezing, no crackles. Normal respiratory effort. No accessory muscle use.  Cardiovascular: Regular rate and rhythm, no  murmurs / rubs / gallops. No extremity edema. 2+ pedal pulses. No carotid bruits.  Abdomen: no tenderness, no masses palpated. No hepatosplenomegaly. Bowel sounds positive.  Musculoskeletal: no clubbing / cyanosis. No joint deformity upper and lower extremities. Good ROM, no contractures. Normal muscle tone.  Skin: no rashes, lesions, ulcers. No induration Neurologic: CN 2-12 grossly intact. Sensation intact, DTR normal. Strength 5/5 in all 4.  Psychiatric: Normal judgment and insight. Alert and oriented x 3. Normal mood.    Labs on Admission: I have personally reviewed following labs and imaging studies  CBC:  Recent Labs Lab 03/30/16 1737  WBC 15.2*  HGB 13.5  HCT 40.5  MCV 92.0  PLT [id number]   Basic Metabolic Panel:  Recent Labs Lab 03/30/16 1737  NA 135  K 3.2*  CL 90*  CO2 34*  GLUCOSE 143*  BUN 19  CREATININE 0.95  CALCIUM 9.1   GFR: Estimated Creatinine Clearance: 102.8 mL/min (by C-G formula based on SCr of 0.95 mg/dL). Liver Function Tests:  Recent Labs Lab 03/30/16 1823  AST 42*  ALT 52  ALKPHOS 134*  BILITOT 0.6  PROT 7.3  ALBUMIN 3.1*    Recent Labs Lab 03/30/16 1823  LIPASE 12    Cardiac Enzymes:  Recent Labs Lab 03/30/16 1737 03/30/16 2001  TROPONINI 0.05* 0.04*     Radiological Exams on Admission: Dg Chest 2 View  Result Date: 03/30/2016 CLINICAL DATA:  Acute onset of shortness of breath, nausea, vomiting and generalized chest pain. Productive cough. Wheezing. Initial encounter. EXAM: CHEST   2 VIEW COMPARISON:  Chest radiograph performed 01/08/2016 FINDINGS: The lungs are well-aerated. Patchy bilateral airspace opacities raise concern for pneumonia, superimposed on the patient's chronic lung changes and emphysema. There is no evidence of pleural effusion or pneumothorax. The heart is normal in size; the mediastinal contour is within normal limits. No acute osseous abnormalities are seen. IMPRESSION: Patchy bilateral airspace opacities raise concern for pneumonia, superimposed on the patient's chronic lung changes and emphysema. Electronically Signed   By: Garald Balding M.D.   On: 03/30/2016 19:13   Cardiac monitor reviewed :  NSR cxr reviewed bilateral infiltrates noted Old chart reviewed Case discussed with FP resident in ED several times  Assessment/Plan 51 yo male with advanced copd, CRF on 3 liters at home comes in with HCAP and sepsis  Principal Problem:   Sepsis ([place])- due to pna.  Will place on broad spectrum abx in the form of vancomycin and zosyn.  Blood and sputum cx obtained.  bp much improved after 30cc/kg ivf bolus.  Lactate is normal.  Ck baseline procalcitonin.  Flu is neg.  Admit to stepdown tonight for close monitoring.  Pt has overall seemed to improve with treatment intitiated in the ED already,.  Active Problems:   Acute-on-chronic respiratory failure ([place])- suspect this is all due to infectious issues.  Supplemental oxygen prn.  IS ordered.      Chronic pain- cont home pain med regimen    Opiate dependence ([place])- noted    COPD exacerbation ([place])- as above, treat his pna, place on solumedrol also at least for a day or so, freq nebs.  Oxygen baseline needs is 3 liters.    History of DVT (deep vein thrombosis)- off xarolto, was post op hip surgery, noted. Since pt has had such marked improved treatment with ivf, nebs in the ED, will not proceed with cta to r/o PE at this time, however this should most certainly remain in the differential if he  should deteriorate  in any manner    HCAP (healthcare-associated pneumonia)- as above, broad abx for now    DVT prophylaxis:  Lovenox, scds Code Status:  full Family Communication: none Disposition Plan:  Per day team Consults called:  none Admission status:  admission   DAVID,RACHAL A MD [facility]  If 7PM-7AM, please contact night-coverage [email or web address] Password [facility]  03/30/2016, 10:39 PM

## 2016-03-30 NOTE — ED Triage Notes (Signed)
Pt has copd, sob for 1 week with n/v and chest pain generalized. Wheezing heard throughout lung fields on auscultation.   Pt alert and oriented.

## 2016-03-30 NOTE — ED Notes (Signed)
ED Provider at bedside. 

## 2016-03-30 NOTE — ED Provider Notes (Signed)
[facility] DEPT Provider Note   CSN: [id number] Arrival date & time: 03/30/16  1728     History   Chief Complaint Chief Complaint  Patient presents with  . Shortness of Breath    HPI Wesley [name] is a 51 y.o. male with PMH of COPD on 3L intermittently as needed per patient, hx of DVT and CAP who presents with generalized weakness, productive cough, shortness of breath, and chest pain  HPI Patient reports of generalized weakness, productive cough with yellow sputum, shortness of breath, rhinorrhea, sore throat, fevers, and chest pain since Friday. Also reports of emesis and post-tussive emesis since Friday'; emesis is non-bloody. He has not been able to tolerate much PO intake and not tolerating his oral medications. His chest pain is diffusely around his chest, is sharp without radiation, is intermittent and aggravated by cough. Reports of intermittent fevers to 101 and 102F. Watery diarrhea x 3 episodes yesterday without blood. No sick contacts. Did receive his flu shot. Reports he was told to use 3L O2 as needed, and he has been needing it through out the day due to saturations to 85% on room air.   Past Medical History:  Diagnosis Date  . Avascular necrosis of femur ([place])   . Back pain   . COPD (chronic obstructive pulmonary disease) ([place])   . Neuropathy ([place])   . Osteoporosis   . Pulmonary asbestosis ([place])   . 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 Sepsis ([place]) 03/30/2016  . Hypokalemia 01/08/2016  . History of DVT (deep vein thrombosis) 01/08/2016  . OA (osteoarthritis) of hip 09/24/2015  . CAP (community acquired pneumonia) 04/14/2015  . Vomiting and diarrhea   . Solitary pulmonary nodule 05/18/2014  . Nausea and vomiting 05/16/2014  . Bronchitis, acute, with bronchospasm 02/24/2014  . Osteoporosis, unspecified 08/06/2012  . Hip pain 08/06/2012  . Opiate abuse,  continuous 01/05/2012  . Opiate dependence ([place]) 01/03/2012  . Chronic respiratory failure ([place]) 01/03/2012  . COPD exacerbation ([place]) 01/03/2012  . Pulmonary infection 01/03/2012  . Pneumonia 07/19/2011  . Fever 07/18/2011  . Myalgia 07/18/2011  . Leukocytosis 07/18/2011  . COPD (chronic obstructive pulmonary disease) ([place]) 07/18/2011  . Acute-on-chronic respiratory failure ([place]) 07/18/2011  . Tachycardia 07/18/2011  . Dehydration 07/18/2011  . Chronic pain 07/18/2011  . Tobacco abuse 07/18/2011    Past Surgical History:  Procedure Laterality Date  . BACK SURGERY    . EYE SURGERY     left eye cornea and lens replaced- blind in left eye  . JOINT REPLACEMENT    . LUNG SURGERY    . TOTAL HIP ARTHROPLASTY Right 09/24/2015   Procedure: TOTAL HIP ARTHROPLASTY ANTERIOR APPROACH;  Surgeon: Gaynelle Arabian, MD;  Location: [facility];  Service: Orthopedics;  Laterality: Right;       Home Medications    Prior to Admission medications   Medication Sig Start Date End Date Taking? Authorizing Provider  ADVAIR DISKUS 250-50 MCG/DOSE AEPB Inhale 1 puff into the lungs 2 (two) times daily. 06/14/15  Yes Historical Provider, MD  albuterol (PROVENTIL HFA;VENTOLIN HFA) 108 (90 Base) MCG/ACT inhaler Inhale 2 puffs into the lungs every 4 (four) hours as needed for wheezing or shortness of breath. Shortness of breath 01/11/16  Yes Estela Leonie Green, MD  albuterol (PROVENTIL) (2.5 MG/3ML) 0.083% nebulizer solution Take 3 mLs (2.5 mg total)  by nebulization every 6 (six) hours as needed for wheezing or shortness of breath. Patient taking differently: Take 2.5 mg by nebulization every 4 (four) hours as needed for wheezing or shortness of breath.  02/27/14  Yes Kathie Dike, MD  aspirin 325 MG tablet Take 325 mg by mouth daily.   Yes Historical Provider, MD  budesonide-formoterol (SYMBICORT) 160-4.5 MCG/ACT inhaler Inhale 3 puffs into the lungs 4 (four) times daily.   Yes Historical Provider, MD    furosemide (LASIX) 40 MG tablet Take 40 mg by mouth daily.   Yes Historical Provider, MD  gabapentin (NEURONTIN) 300 MG capsule Take 600 mg by mouth 3 (three) times daily.    Yes Historical Provider, MD  methocarbamol (ROBAXIN) 500 MG tablet Take 1 tablet (500 mg total) by mouth every 6 (six) hours as needed for muscle spasms. 09/24/15  Yes Gaynelle Arabian, MD  naloxone HCl (NARCAN) 4 MG/0.1ML LIQD Place 4 mg into the nose as directed. To prevent overdose   Yes Historical Provider, MD  ondansetron (ZOFRAN ODT) 4 MG disintegrating tablet Take 1 tablet (4 mg total) by mouth every 8 (eight) hours as needed for nausea or vomiting. 05/24/14  Yes Samuella Cota, MD  oxyCODONE (OXYCONTIN) 30 MG 12 hr tablet Take 30 mg by mouth every 8 (eight) hours.   Yes Historical Provider, MD  oxycodone (ROXICODONE) 30 MG immediate release tablet Take 30 mg by mouth every 4 (four) hours.   Yes Historical Provider, MD  OXYGEN Inhale 3 L into the lungs daily as needed.   Yes Historical Provider, MD  Polyethyl Glycol-Propyl Glycol (SYSTANE) 0.4-0.3 % SOLN Place 1-2 drops into the left eye daily as needed (for dry eye relief).    Yes Historical Provider, MD  tiotropium (SPIRIVA) 18 MCG inhalation capsule Place 1 capsule (18 mcg total) into inhaler and inhale daily. 07/24/11  Yes Nimish Luther Parody, MD  amoxicillin-clavulanate (AUGMENTIN) 875-125 MG tablet Take 1 tablet by mouth 2 (two) times daily. Patient not taking: Reported on 03/30/2016 01/11/16   Erline Hau, MD  Oxycodone HCl 20 MG TABS Take 1 tablet (20 mg total) by mouth 3 (three) times daily. Patient not taking: Reported on 03/30/2016 10/15/15   Tanna Furry, MD    Family History Family History  Problem Relation Age of Onset  . Heart failure Father   . Diabetes Father   . Diabetes Mother   . Cancer Mother     male cancer    Social History Social History  Substance Use Topics  . Smoking status: Former Smoker    Packs/day: 1.00    Quit date:  07/05/2011  . Smokeless tobacco: Never Used  . Alcohol use No     Allergies   Ambien [zolpidem tartrate]; Melatonin; and Zolpidem   Review of Systems Review of Systems  Constitutional: Positive for chills and fever.  HENT: Positive for rhinorrhea and sore throat. Negative for trouble swallowing and voice change.   Respiratory: Positive for cough, shortness of breath and wheezing.   Cardiovascular: Positive for chest pain.  Gastrointestinal: Positive for diarrhea, nausea and vomiting.       "soreness of abdomen"  Genitourinary: Negative for dysuria.  Skin: Negative for rash.  Neurological: Positive for weakness (generalized weakness) and light-headedness. Negative for facial asymmetry, numbness and headaches.    Physical Exam Updated Vital Signs BP 103/61   Pulse 109   Temp 98.2 F (36.8 C) (Oral)   Resp 20   Ht 6\' 1"  (1.854 m)  Wt 83.5 kg   SpO2 96%   BMI 24.28 kg/m   Physical Exam  Constitutional: He is oriented to person, place, and time. He appears well-developed and well-nourished. No distress.  HENT:  Mouth/Throat: Oropharynx is clear and moist. No oropharyngeal exudate.  Eyes: Conjunctivae are normal. Pupils are equal, round, and reactive to light. Right eye exhibits no discharge. Left eye exhibits no discharge.  Neck: Normal range of motion. Neck supple.  Cardiovascular: Regular rhythm and normal heart sounds.  Exam reveals no gallop and no friction rub.   No murmur heard. Tachycardia with worsening tachycardia with sitting up  Pulmonary/Chest: He has wheezes. He has no rales. He exhibits tenderness.  Mildly increased effort. On 3L O2 [place] with 94% saturations.  Abdominal: Soft. There is no rebound and no guarding.  Diffused "discomfort" to palpation per patient report.   Lymphadenopathy:    He has no cervical adenopathy.  Neurological: He is alert and oriented to person, place, and time.  Skin: Skin is warm and dry. Capillary refill takes less than 2 seconds.    Psychiatric: He has a normal mood and affect. His behavior is normal.     ED Treatments / Results  Labs (all labs ordered are listed, but only abnormal results are displayed) Labs Reviewed  BASIC METABOLIC PANEL - Abnormal; Notable for the following:       Result Value   Potassium 3.2 (*)    Chloride 90 (*)    CO2 34 (*)    Glucose, Bld 143 (*)    All other components within normal limits  CBC - Abnormal; Notable for the following:    WBC 15.2 (*)    All other components within normal limits  TROPONIN I - Abnormal; Notable for the following:    Troponin I 0.05 (*)    All other components within normal limits  HEPATIC FUNCTION PANEL - Abnormal; Notable for the following:    Albumin 3.1 (*)    AST 42 (*)    Alkaline Phosphatase 134 (*)    All other components within normal limits  TROPONIN I - Abnormal; Notable for the following:    Troponin I 0.04 (*)    All other components within normal limits  CULTURE, BLOOD (ROUTINE X 2)  CULTURE, BLOOD (ROUTINE X 2)  URINE CULTURE  LIPASE, BLOOD  INFLUENZA PANEL BY PCR (TYPE A & B)  LACTIC ACID, PLASMA  URINALYSIS, ROUTINE W REFLEX MICROSCOPIC  LACTIC ACID, PLASMA  I-STAT TROPOININ, ED    EKG  EKG Interpretation None       Radiology Dg Chest 2 View  Result Date: 03/30/2016 CLINICAL DATA:  Acute onset of shortness of breath, nausea, vomiting and generalized chest pain. Productive cough. Wheezing. Initial encounter. EXAM: CHEST  2 VIEW COMPARISON:  Chest radiograph performed 01/08/2016 FINDINGS: The lungs are well-aerated. Patchy bilateral airspace opacities raise concern for pneumonia, superimposed on the patient's chronic lung changes and emphysema. There is no evidence of pleural effusion or pneumothorax. The heart is normal in size; the mediastinal contour is within normal limits. No acute osseous abnormalities are seen. IMPRESSION: Patchy bilateral airspace opacities raise concern for pneumonia, superimposed on the patient's  chronic lung changes and emphysema. Electronically Signed   By: Garald Balding M.D.   On: 03/30/2016 19:13    Procedures Procedures (including critical care time)  Medications Ordered in ED Medications  azithromycin (ZITHROMAX) 500 mg in dextrose 5 % 250 mL IVPB (500 mg Intravenous New Bag/Given 03/30/16 2058)  methylPREDNISolone sodium succinate (SOLU-MEDROL) 125 mg/2 mL injection 125 mg (not administered)  ipratropium-albuterol (DUONEB) 0.5-2.5 (3) MG/3ML nebulizer solution 3 mL (3 mLs Nebulization Given 03/30/16 1859)  sodium chloride 0.9 % bolus 500 mL (0 mLs Intravenous Stopped 03/30/16 1935)  oxyCODONE (Oxy IR/ROXICODONE) immediate release tablet 20 mg (20 mg Oral Given 03/30/16 1839)  ondansetron (ZOFRAN-ODT) disintegrating tablet 4 mg (4 mg Oral Given 03/30/16 1839)  potassium chloride SA (K-DUR,KLOR-CON) CR tablet 40 mEq (40 mEq Oral Given 03/30/16 1930)  aspirin chewable tablet 324 mg (324 mg Oral Given 03/30/16 1931)  cefTRIAXone (ROCEPHIN) 1 g in dextrose 5 % 50 mL IVPB (0 g Intravenous Stopped 03/30/16 2100)  sodium chloride 0.9 % bolus 1,000 mL (0 mLs Intravenous Stopped 03/30/16 2057)  benzonatate (TESSALON) capsule 100 mg (100 mg Oral Given 03/30/16 1943)  albuterol (PROVENTIL) (2.5 MG/3ML) 0.083% nebulizer solution 2.5 mg (2.5 mg Nebulization Given 03/30/16 1948)  ipratropium (ATROVENT) 0.02 % nebulizer solution (0.5 mg  Given 03/30/16 1959)  albuterol (PROVENTIL, VENTOLIN) (5 MG/ML) 0.5% continuous inhalation solution (15 mg  Given 03/30/16 1959)  sodium chloride 0.9 % bolus 1,500 mL (1,500 mLs Intravenous New Bag/Given 03/30/16 2058)     Initial Impression / Assessment and Plan / ED Course  I have reviewed the triage vital signs and the nursing notes.  Pertinent labs & imaging results that were available during my care of the patient were reviewed by me and considered in my medical decision making (see chart for details).  6:00PM: Patient is a 51 yo with PMH of COPD who presents  with productive cough, shortness of breath, and chest pain. Patient meets Sepsis criteria with tachycardia and leukocytosis. BP 99/53. Patient alert and oriented x 3.  CXR with patchy infiltrates concerning for PNA. Will start with 500cc bolus of NS. Duoneb x 1. EKG ordered. EKG with sinus tachycardia without ischemic changes.  1852: Troponin 0.05. Will given ASA 324 x 1. Slight elevated likely demand in the setting of tachycardia.   1930: noted to have BP 84/54. Re-evaluated pt. Patient is awake and alert. 1L bolus ordered. CTX and Azithromycin ordered.  2000: noted to have BP 83/46. Patient continues to be alert. IVF running. Will order 1.5L bolus to meet 30cc/kg goal of IVF 2100: lactic acid 0.8. Discussed with admitting physician; will wait until full 30cc/kg is given to see if BP normalizes prior to placing admission orderd. Will give Solumedrol x 1.   21:32: patient breathing better. BP improved as well. Admitting physician placed orders for step down.   Final Clinical Impressions(s) / ED Diagnoses   Final diagnoses:  COPD exacerbation ([place])  Community acquired pneumonia, unspecified laterality    New Prescriptions New Prescriptions   No medications on file     Smiley Houseman, MD 03/30/16 2134    Smiley Houseman, MD 03/30/16 2135    Margette Fast, MD 03/31/16 1007

## 2016-03-30 NOTE — Progress Notes (Signed)
Pharmacy Antibiotic Note  Wesley [name] is a 51 y.o. male admitted on 03/30/2016 with pneumonia.  Pharmacy has been consulted for Vancomycin and Zosyn  dosing. Rocephin and Zithromax given in ED.  Plan: Vancomycin 1gm IV every 8 hours.  Goal trough 15-20 mcg/mL.  Zosyn 3.375gm IV every 8 hours. Follow-up micro data, labs, vitals.   Height: 6\' 1"  (185.4 cm) Weight: 184 lb (83.5 kg) IBW/kg (Calculated) : 79.9  Temp (24hrs), Avg:98.2 F (36.8 C), Min:98.2 F (36.8 C), Max:98.2 F (36.8 C)   Recent Labs Lab 03/30/16 1737 03/30/16 2001  WBC 15.2*  --   CREATININE 0.95  --   LATICACIDVEN  --  0.8    Estimated Creatinine Clearance: 102.8 mL/min (by C-G formula based on SCr of 0.95 mg/dL).    Allergies  Allergen Reactions  . Ambien [Zolpidem Tartrate] Other (See Comments)    hallucinations  . Melatonin Itching  . Zolpidem Other (See Comments)    Altered mental status    Antimicrobials this admission: Vanc 1/25 >>  Zosyn 1/25 >>   Dose adjustments this admission: n/a   Microbiology results: 1/25 BCx: pending  Thank you for allowing pharmacy to be a part of this patient's care.  Pricilla Larsson 03/30/2016 9:53 PM

## 2016-03-30 NOTE — ED Notes (Signed)
CRITICAL VALUE ALERT  Critical value received:  Troponin 0.05  Date of notification:  03/30/2016  Time of notification:  [id number]  Critical value read back:YES  Nurse who received alert:  Lavenia Atlas, RN  MD notified (1st page):  Dr. Laverta Baltimore notified at [phone]

## 2016-03-31 ENCOUNTER — Inpatient Hospital Stay (HOSPITAL_COMMUNITY): Payer: Medicare HMO

## 2016-03-31 DIAGNOSIS — J441 Chronic obstructive pulmonary disease with (acute) exacerbation: Secondary | ICD-10-CM

## 2016-03-31 DIAGNOSIS — G8921 Chronic pain due to trauma: Secondary | ICD-10-CM

## 2016-03-31 LAB — LACTIC ACID, PLASMA: LACTIC ACID, VENOUS: 0.7 mmol/L (ref 0.5–1.9)

## 2016-03-31 LAB — CBC WITH DIFFERENTIAL/PLATELET
BASOS ABS: 0.1 10*3/uL (ref 0.0–0.1)
BASOS PCT: 1 %
Eosinophils Absolute: 0.1 10*3/uL (ref 0.0–0.7)
Eosinophils Relative: 1 %
HEMATOCRIT: 38.5 % — AB (ref 39.0–52.0)
HEMOGLOBIN: 12.6 g/dL — AB (ref 13.0–17.0)
LYMPHS PCT: 6 %
Lymphs Abs: 0.6 10*3/uL — ABNORMAL LOW (ref 0.7–4.0)
MCH: 30.7 pg (ref 26.0–34.0)
MCHC: 32.7 g/dL (ref 30.0–36.0)
MCV: 93.9 fL (ref 78.0–100.0)
MONOS PCT: 5 %
Monocytes Absolute: 0.5 10*3/uL (ref 0.1–1.0)
NEUTROS ABS: 9.5 10*3/uL — AB (ref 1.7–7.7)
Neutrophils Relative %: 87 %
Platelets: 302 10*3/uL (ref 150–400)
RBC: 4.1 MIL/uL — ABNORMAL LOW (ref 4.22–5.81)
RDW: 13.4 % (ref 11.5–15.5)
WBC: 10.8 10*3/uL — ABNORMAL HIGH (ref 4.0–10.5)

## 2016-03-31 LAB — GLUCOSE, CAPILLARY
GLUCOSE-CAPILLARY: 167 mg/dL — AB (ref 65–99)
Glucose-Capillary: 233 mg/dL — ABNORMAL HIGH (ref 65–99)

## 2016-03-31 LAB — TROPONIN I
Troponin I: 0.03 ng/mL (ref <0.03)
Troponin I: 0.03 ng/mL (ref <0.03)

## 2016-03-31 LAB — BASIC METABOLIC PANEL
ANION GAP: 8 (ref 5–15)
BUN: 15 mg/dL (ref 6–20)
CO2: 29 mmol/L (ref 22–32)
Calcium: 8.1 mg/dL — ABNORMAL LOW (ref 8.9–10.3)
Chloride: 100 mmol/L — ABNORMAL LOW (ref 101–111)
Creatinine, Ser: 0.91 mg/dL (ref 0.61–1.24)
GFR calc Af Amer: >60 mL/min (ref >60)
GFR calc non Af Amer: >60 mL/min (ref >60)
GLUCOSE: 194 mg/dL — AB (ref 65–99)
POTASSIUM: 4 mmol/L (ref 3.5–5.1)
Sodium: 137 mmol/L (ref 135–145)

## 2016-03-31 LAB — STREP PNEUMONIAE URINARY ANTIGEN: Strep Pneumo Urinary Antigen: NEGATIVE

## 2016-03-31 LAB — PROCALCITONIN: Procalcitonin: 0.45 ng/mL

## 2016-03-31 LAB — MRSA PCR SCREENING: MRSA by PCR: NEGATIVE

## 2016-03-31 MED ORDER — LORAZEPAM 0.5 MG PO TABS
0.5000 mg | ORAL_TABLET | Freq: Once | ORAL | Status: AC
  Administered 2016-03-31: 0.5 mg via ORAL
  Filled 2016-03-31: qty 1

## 2016-03-31 MED ORDER — SODIUM CHLORIDE 0.9 % IV BOLUS (SEPSIS): 1000.0000 mL | INTRAVENOUS | Status: DC | PRN

## 2016-03-31 MED ORDER — ALUM & MAG HYDROXIDE-SIMETH 200-200-20 MG/5ML PO SUSP
30.0000 mL | ORAL | Status: DC | PRN
  Administered 2016-03-31: 30 mL via ORAL
  Filled 2016-03-31: qty 30

## 2016-03-31 MED ORDER — SODIUM CHLORIDE 0.9 % IV SOLN
INTRAVENOUS | Status: DC
  Administered 2016-03-31: 18:00:00 via INTRAVENOUS

## 2016-03-31 MED ORDER — INSULIN ASPART 100 UNIT/ML ~~LOC~~ SOLN
0.0000 [IU] | Freq: Three times a day (TID) | SUBCUTANEOUS | Status: DC
  Administered 2016-04-01: 2 [IU] via SUBCUTANEOUS
  Administered 2016-04-01: 3 [IU] via SUBCUTANEOUS
  Administered 2016-04-02: 2 [IU] via SUBCUTANEOUS
  Administered 2016-04-02: 3 [IU] via SUBCUTANEOUS
  Administered 2016-04-03: 2 [IU] via SUBCUTANEOUS

## 2016-03-31 MED ORDER — GUAIFENESIN-DM 100-10 MG/5ML PO SYRP
5.0000 mL | ORAL_SOLUTION | ORAL | Status: DC | PRN
  Administered 2016-03-31 – 2016-04-03 (×9): 5 mL via ORAL
  Filled 2016-03-31 (×9): qty 5

## 2016-03-31 MED ORDER — SODIUM CHLORIDE 0.9 % IV SOLN
INTRAVENOUS | Status: DC
Start: 2016-03-31 — End: 2016-03-31
  Administered 2016-03-31: 07:00:00 via INTRAVENOUS

## 2016-03-31 MED ORDER — INSULIN ASPART 100 UNIT/ML ~~LOC~~ SOLN: 0.0000 [IU] | Freq: Every day | SUBCUTANEOUS | Status: DC

## 2016-03-31 NOTE — Progress Notes (Signed)
PROGRESS NOTE                                                                                                                                                                                                             Patient Demographics:    Wesley [name], is a 51 y.o. male, DOB - 01-10-1966, [id number]  Admit date - 03/30/2016   Admitting Physician Phillips Grout, MD  Outpatient Primary MD for the patient is [name], MD  LOS - 1  Chief Complaint  Patient presents with  . Shortness of Breath       Brief Narrative  [name] is a 51 y.o. male with medical history significant of advanced COPD on 3 liters [place] oxygen at home, chronic pain managed at [facility] pain management, post op DVT in 8/17 treated with xaralto now off,  Avascular necrosis of hip s/p repair, pulmonary asbestosis , also with CAP in 11/17 treated with azithro/rocephin as inpatient transitioned to augmentin as outpatient comes in with several days of worsening sob, wheezing and productive cough due to Pneumonia.   Subjective:    [name] today has, No headache, No chest pain, No abdominal pain - No Nausea, No new weakness tingling or numbness, much improved Cough - SOB.     Assessment  & Plan :    1.Sepsis due to pneumonia with COPD exacerbation causing acute on chronic hypoxic respiratory failure.   Sepsis physiology has resolved, lactate normal, he appears nontoxic, gently hydrate for 10 more hours, follow cultures and for now continue empiric IV antibiotics ordered.   2. COPD exacerbation. IV steroids, nebulizer treatments and oxygen. He is on home oxygen. Antibiotics as above.  3. Chronic pain. Home regimen continued.  4. History of DVT. Has finished anticoagulation. Follow with PCP outpatient post discharge. Prophylactic Lovenox while he is here.    Family Communication  : None  Code Status :  Full  Diet : Diet Heart  Room service appropriate? Yes; Fluid consistency: Thin   Disposition Plan  :  Home 1-2 days  Consults  :  None  Procedures  :  None  DVT Prophylaxis  :  Lovenox   Lab Results  Component Value Date   PLT 302 03/31/2016    Inpatient Medications  Scheduled Meds: . aspirin  325 mg Oral Daily  .  enoxaparin (LOVENOX) injection  40 mg Subcutaneous Q24H  . gabapentin  600 mg Oral TID  . methylPREDNISolone (SOLU-MEDROL) injection  80 mg Intravenous Q12H  . nicotine  21 mg Transdermal Daily  . oxycodone  30 mg Oral Q4H  . oxyCODONE  30 mg Oral Q8H  . piperacillin-tazobactam (ZOSYN)  IV  3.375 g Intravenous Q8H  . tiotropium  18 mcg Inhalation Daily   Continuous Infusions: . sodium chloride 150 mL/hr at 03/31/16 0631   PRN Meds:.albuterol, albuterol, ipratropium, methocarbamol, ondansetron, polyvinyl alcohol, sodium chloride  Antibiotics  :    Anti-infectives    Start     Dose/Rate Route Frequency Ordered Stop   03/31/16 0600  piperacillin-tazobactam (ZOSYN) IVPB 3.375 g     3.375 g 12.5 mL/hr over 240 Minutes Intravenous Every 8 hours 03/30/16 2159     03/30/16 2200  vancomycin (VANCOCIN) IVPB 1000 mg/200 mL premix  Status:  Discontinued     1,000 mg 200 mL/hr over 60 Minutes Intravenous Every 8 hours 03/30/16 2159 03/30/16 2245   03/30/16 2200  piperacillin-tazobactam (ZOSYN) IVPB 3.375 g     3.375 g 100 mL/hr over 30 Minutes Intravenous  Once 03/30/16 2159 03/30/16 2255   03/30/16 1945  cefTRIAXone (ROCEPHIN) 1 g in dextrose 5 % 50 mL IVPB     1 g 100 mL/hr over 30 Minutes Intravenous  Once 03/30/16 1937 03/30/16 2100   03/30/16 1945  azithromycin (ZITHROMAX) 500 mg in dextrose 5 % 250 mL IVPB     500 mg 250 mL/hr over 60 Minutes Intravenous  Once 03/30/16 1937 03/30/16 2224         Objective:   Vitals:   03/30/16 2230 03/30/16 2330 03/31/16 0400 03/31/16 0452  BP: (!) 123/102     Pulse:      Resp:      Temp:  98.3 F (36.8 C) 97.7 F (36.5 C)   TempSrc:   Oral Axillary   SpO2:      Weight:  82.8 kg (182 lb 8.7 oz)  82.4 kg (181 lb 10.5 oz)  Height:  6\' 1"  (1.854 m)      Wt Readings from Last 3 Encounters:  03/31/16 82.4 kg (181 lb 10.5 oz)  01/08/16 82.1 kg (181 lb)  10/15/15 82.6 kg (182 lb)     Intake/Output Summary (Last 24 hours) at 03/31/16 0943 Last data filed at 03/31/16 0500  Gross per 24 hour  Intake             3350 ml  Output              625 ml  Net             2725 ml     Physical Exam  Awake Alert, Oriented X 3, No new F.N deficits, Normal affect [place].AT,PERRAL Supple Neck,No JVD, No cervical lymphadenopathy appriciated.  Symmetrical Chest wall movement, Good air movement bilaterally, CTAB RRR,No Gallops,Rubs or new Murmurs, No Parasternal Heave +ve B.Sounds, Abd Soft, No tenderness, No organomegaly appriciated, No rebound - guarding or rigidity. No Cyanosis, Clubbing or edema, No new Rash or bruise       Data Review:    CBC  Recent Labs Lab 03/30/16 1737 03/30/16 2237 03/31/16 0256  WBC 15.2* 15.9* 10.8*  HGB 13.5 13.8 12.6*  HCT 40.5 41.6 38.5*  PLT 352 365 302  MCV 92.0 93.3 93.9  MCH 30.7 30.9 30.7  MCHC 33.3 33.2 32.7  RDW 13.2 13.2 13.4  LYMPHSABS  --  1.1 0.6*  MONOABS  --  2.9* 0.5  EOSABS  --  0.1 0.1  BASOSABS  --  0.1 0.1    Chemistries   Recent Labs Lab 03/30/16 1737 03/30/16 1823 03/30/16 2237 03/31/16 0256  NA 135  --  135 137  K 3.2*  --  3.6 4.0  CL 90*  --  99* 100*  CO2 34*  --  29 29  GLUCOSE 143*  --  138* 194*  BUN 19  --  15 15  CREATININE 0.95  --  0.83 0.91  CALCIUM 9.1  --  7.5* 8.1*  AST  --  42* 40  --   ALT  --  52 42  --   ALKPHOS  --  134* 104  --   BILITOT  --  0.6 0.4  --    ------------------------------------------------------------------------------------------------------------------ No results for input(s): CHOL, HDL, LDLCALC, TRIG, CHOLHDL, LDLDIRECT in the last 72 hours.  Lab Results  Component Value Date   HGBA1C 5.5 08/06/2012    ------------------------------------------------------------------------------------------------------------------ No results for input(s): TSH, T4TOTAL, T3FREE, THYROIDAB in the last 72 hours.  Invalid input(s): FREET3 ------------------------------------------------------------------------------------------------------------------ No results for input(s): VITAMINB12, FOLATE, FERRITIN, TIBC, IRON, RETICCTPCT in the last 72 hours.  Coagulation profile  Recent Labs Lab 03/30/16 2237  INR 1.02    No results for input(s): DDIMER in the last 72 hours.  Cardiac Enzymes  Recent Labs Lab 03/30/16 2001 03/31/16 0256 03/31/16 0815  TROPONINI 0.04* 0.03* 0.03*   ------------------------------------------------------------------------------------------------------------------ No results found for: BNP  Micro Results Recent Results (from the past 240 hour(s))  Blood Culture (routine x 2)     Status: None (Preliminary result)   Collection Time: 03/30/16  8:01 PM  Result Value Ref Range Status   Specimen Description RIGHT ANTECUBITAL  Final   Special Requests BOTTLES DRAWN AEROBIC AND ANAEROBIC 6CC EACH  Final   Culture NO GROWTH < 12 HOURS  Final   Report Status PENDING  Incomplete  Blood Culture (routine x 2)     Status: None (Preliminary result)   Collection Time: 03/30/16  8:15 PM  Result Value Ref Range Status   Specimen Description BLOOD LEFT HAND  Final   Special Requests BOTTLES DRAWN AEROBIC AND ANAEROBIC 6CC EACH  Final   Culture NO GROWTH < 12 HOURS  Final   Report Status PENDING  Incomplete  MRSA PCR Screening     Status: None   Collection Time: 03/30/16 10:45 PM  Result Value Ref Range Status   MRSA by PCR NEGATIVE NEGATIVE Final    Comment:        The GeneXpert MRSA Assay (FDA approved for NASAL specimens only), is one component of a comprehensive MRSA colonization surveillance program. It is not intended to diagnose MRSA infection nor to guide or monitor  treatment for MRSA infections.     Radiology Reports Dg Chest 2 View  Result Date: 03/30/2016 CLINICAL DATA:  Acute onset of shortness of breath, nausea, vomiting and generalized chest pain. Productive cough. Wheezing. Initial encounter. EXAM: CHEST  2 VIEW COMPARISON:  Chest radiograph performed 01/08/2016 FINDINGS: The lungs are well-aerated. Patchy bilateral airspace opacities raise concern for pneumonia, superimposed on the patient's chronic lung changes and emphysema. There is no evidence of pleural effusion or pneumothorax. The heart is normal in size; the mediastinal contour is within normal limits. No acute osseous abnormalities are seen. IMPRESSION: Patchy bilateral airspace opacities raise concern for pneumonia, superimposed on the patient's chronic lung changes and emphysema. Electronically Signed  By: Garald Balding M.D.   On: 03/30/2016 19:13   Dg Chest Port 1 View  Result Date: 03/31/2016 CLINICAL DATA:  Shortness of breath . EXAM: PORTABLE CHEST 1 VIEW COMPARISON:  03/30/2016 . FINDINGS: Heart size normal. Diffuse bilateral pulmonary interstitial prominence noted consistent with pneumonitis. COPD. No pleural effusion or pneumothorax. IMPRESSION: Diffuse bilateral pulmonary interstitial prominence noted consistent with pneumonitis. COPD. Electronically Signed   By: Marcello Moores  Register   On: 03/31/2016 07:42    Time Spent in minutes  30   Lala Lund K M.D on 03/31/2016 at 9:43 AM  Between 7am to 7pm - Pager - [phone]  After 7pm go to [email or web address] - password [facility]  [facility] -  Office  [phone]

## 2016-03-31 NOTE — Progress Notes (Signed)
Nutrition Brief Note  Patient erroneously identified on the Malnutrition Screening Tool (MST) Report. See weight hx below.  Wt Readings from Last 15 Encounters:  03/31/16 181 lb 10.5 oz (82.4 kg)  01/08/16 181 lb (82.1 kg)  10/15/15 182 lb (82.6 kg)  09/24/15 182 lb 12.8 oz (82.9 kg)  09/15/15 182 lb 12.8 oz (82.9 kg)  08/26/15 176 lb (79.8 kg)  04/16/15 184 lb 1.4 oz (83.5 kg)  05/22/14 176 lb (79.8 kg)  05/16/14 178 lb 2.1 oz (80.8 kg)  02/24/14 173 lb (78.5 kg)  12/22/13 174 lb (78.9 kg)  02/04/13 158 lb 1 oz (71.7 kg)  11/27/12 157 lb (71.2 kg)  08/07/12 160 lb 8 oz (72.8 kg)  06/14/12 160 lb (72.6 kg)    Body mass index is 23.97 kg/m. Patient meets criteria for normal based on current BMI.   His appetite is excellent. Current diet order is Heart Healthy and the patient is consuming approximately 100% of meals at this time. Labs and medications reviewed.   No nutrition interventions warranted at this time. If nutrition issues arise, please consult RD.   Colman Cater MS,RD,CSG,LDN Office: [phone] Pager: [phone]

## 2016-04-01 ENCOUNTER — Inpatient Hospital Stay (HOSPITAL_COMMUNITY): Payer: Medicare HMO

## 2016-04-01 DIAGNOSIS — J9620 Acute and chronic respiratory failure, unspecified whether with hypoxia or hypercapnia: Secondary | ICD-10-CM

## 2016-04-01 LAB — BASIC METABOLIC PANEL
ANION GAP: 7 (ref 5–15)
BUN: 12 mg/dL (ref 6–20)
CHLORIDE: 98 mmol/L — AB (ref 101–111)
CO2: 32 mmol/L (ref 22–32)
Calcium: 8.4 mg/dL — ABNORMAL LOW (ref 8.9–10.3)
Creatinine, Ser: 0.79 mg/dL (ref 0.61–1.24)
GFR calc Af Amer: >60 mL/min (ref >60)
GFR calc non Af Amer: >60 mL/min (ref >60)
GLUCOSE: 164 mg/dL — AB (ref 65–99)
POTASSIUM: 4.2 mmol/L (ref 3.5–5.1)
SODIUM: 137 mmol/L (ref 135–145)

## 2016-04-01 LAB — GLUCOSE, CAPILLARY
GLUCOSE-CAPILLARY: 124 mg/dL — AB (ref 65–99)
Glucose-Capillary: 140 mg/dL — ABNORMAL HIGH (ref 65–99)
Glucose-Capillary: 156 mg/dL — ABNORMAL HIGH (ref 65–99)
Glucose-Capillary: 149 mg/dL — ABNORMAL HIGH (ref 65–99)

## 2016-04-01 LAB — CBC
HEMATOCRIT: 35.8 % — AB (ref 39.0–52.0)
HEMOGLOBIN: 11.4 g/dL — AB (ref 13.0–17.0)
MCH: 30.8 pg (ref 26.0–34.0)
MCHC: 31.8 g/dL (ref 30.0–36.0)
MCV: 96.8 fL (ref 78.0–100.0)
Platelets: 374 10*3/uL (ref 150–400)
RBC: 3.7 MIL/uL — ABNORMAL LOW (ref 4.22–5.81)
RDW: 13.8 % (ref 11.5–15.5)
WBC: 22 10*3/uL — AB (ref 4.0–10.5)

## 2016-04-01 LAB — URINE CULTURE: Culture: NO GROWTH

## 2016-04-01 MED ORDER — SODIUM CHLORIDE 0.9 % IV BOLUS (SEPSIS)
500.0000 mL | Freq: Once | INTRAVENOUS | Status: AC
  Administered 2016-04-01: 1000 mL via INTRAVENOUS

## 2016-04-01 MED ORDER — SODIUM CHLORIDE 0.9 % IV BOLUS (SEPSIS): 1000.0000 mL | Freq: Once | INTRAVENOUS | Status: DC

## 2016-04-01 MED ORDER — METHYLPREDNISOLONE SODIUM SUCC 125 MG IJ SOLR
80.0000 mg | Freq: Every day | INTRAMUSCULAR | Status: DC
  Administered 2016-04-02 – 2016-04-03 (×2): 80 mg via INTRAVENOUS
  Filled 2016-04-01 (×2): qty 2

## 2016-04-01 MED ORDER — LEVOFLOXACIN 750 MG PO TABS
750.0000 mg | ORAL_TABLET | Freq: Every day | ORAL | Status: DC
  Administered 2016-04-01 – 2016-04-03 (×3): 750 mg via ORAL
  Filled 2016-04-01 (×3): qty 1

## 2016-04-01 MED ORDER — ALBUTEROL SULFATE (2.5 MG/3ML) 0.083% IN NEBU
3.0000 mL | INHALATION_SOLUTION | Freq: Four times a day (QID) | RESPIRATORY_TRACT | Status: DC
  Administered 2016-04-01 – 2016-04-03 (×6): 3 mL via RESPIRATORY_TRACT
  Filled 2016-04-01 (×6): qty 3

## 2016-04-01 NOTE — Progress Notes (Signed)
PROGRESS NOTE                                                                                                                                                                                                             Patient Demographics:    Wesley [name], is a 51 y.o. male, DOB - Dec 13, 1965, [id number]  Admit date - 03/30/2016   Admitting Physician Phillips Grout, MD  Outpatient Primary MD for the patient is [name], MD  LOS - 2  Chief Complaint  Patient presents with  . Shortness of Breath       Brief Narrative  Wesley [name] is a 51 y.o. male with medical history significant of advanced COPD on 3 liters [place] oxygen at home, chronic pain managed at [facility] pain management, post op DVT in 8/17 treated with xaralto now off,  Avascular necrosis of hip s/p repair, pulmonary asbestosis , also with CAP in 11/17 treated with azithro/rocephin as inpatient transitioned to augmentin as outpatient comes in with several days of worsening sob, wheezing and productive cough due to Pneumonia.   Subjective:    Wesley [name] today has, No headache, No chest pain, No abdominal pain - No Nausea, No new weakness tingling or numbness, much improved Cough - SOB.     Assessment  & Plan :    1.Sepsis due to pneumonia with COPD exacerbation causing acute on chronic hypoxic respiratory failure.   Sepsis physiology has resolved, lactate normal, he appears nontoxic, Has been hydrated, cultures pending, currently on Zosyn and Levaquin combination, does have impressive leukocytosis which could be due to steroids being on board but will check CT scan chest to rule out any acute findings that could be missed on x-ray.  2. COPD exacerbation. IV steroids, nebulizer treatments and oxygen. He is on home oxygen. Antibiotics as above. Close to his baseline at this point.  3. Chronic pain. Home regimen continued.  4. History of DVT. Has  finished anticoagulation. Follow with PCP outpatient post discharge. Prophylactic Lovenox while he is here.  5. Patient reports that he got confused last night. Likely mild delirium due to her acute illness and Hospital setting along with steroids being on board, supportive care, he has no headache or focal deficits. Minimize steroids now as COPD exacerbation is improved.    Family Communication  :  None  Code Status :  Full  Diet : Diet Heart Room service appropriate? Yes; Fluid consistency: Thin   Disposition Plan  :  Home 1-2 days  Consults  :  None  Procedures  :  None  DVT Prophylaxis  :  Lovenox   Lab Results  Component Value Date   PLT 374 04/01/2016    Inpatient Medications  Scheduled Meds: . albuterol  3 mL Nebulization Q6H  . aspirin  325 mg Oral Daily  . enoxaparin (LOVENOX) injection  40 mg Subcutaneous Q24H  . gabapentin  600 mg Oral TID  . insulin aspart  0-15 Units Subcutaneous TID WC  . insulin aspart  0-5 Units Subcutaneous QHS  . levofloxacin  750 mg Oral Daily  . [START ON 04/02/2016] methylPREDNISolone (SOLU-MEDROL) injection  80 mg Intravenous Daily  . nicotine  21 mg Transdermal Daily  . oxycodone  30 mg Oral Q4H  . oxyCODONE  30 mg Oral Q8H  . piperacillin-tazobactam (ZOSYN)  IV  3.375 g Intravenous Q8H  . tiotropium  18 mcg Inhalation Daily   Continuous Infusions:  PRN Meds:.albuterol, alum & mag hydroxide-simeth, guaiFENesin-dextromethorphan, ipratropium, methocarbamol, ondansetron, polyvinyl alcohol, sodium chloride  Antibiotics  :    Anti-infectives    Start     Dose/Rate Route Frequency Ordered Stop   04/01/16 1000  levofloxacin (LEVAQUIN) tablet 750 mg     750 mg Oral Daily 04/01/16 0752     03/31/16 0600  piperacillin-tazobactam (ZOSYN) IVPB 3.375 g     3.375 g 12.5 mL/hr over 240 Minutes Intravenous Every 8 hours 03/30/16 2159     03/30/16 2200  vancomycin (VANCOCIN) IVPB 1000 mg/200 mL premix  Status:  Discontinued     1,000  mg 200 mL/hr over 60 Minutes Intravenous Every 8 hours 03/30/16 2159 03/30/16 2245   03/30/16 2200  piperacillin-tazobactam (ZOSYN) IVPB 3.375 g     3.375 g 100 mL/hr over 30 Minutes Intravenous  Once 03/30/16 2159 03/30/16 2255   03/30/16 1945  cefTRIAXone (ROCEPHIN) 1 g in dextrose 5 % 50 mL IVPB     1 g 100 mL/hr over 30 Minutes Intravenous  Once 03/30/16 1937 03/30/16 2100   03/30/16 1945  azithromycin (ZITHROMAX) 500 mg in dextrose 5 % 250 mL IVPB     500 mg 250 mL/hr over 60 Minutes Intravenous  Once 03/30/16 1937 03/30/16 2224         Objective:   Vitals:   03/31/16 2150 04/01/16 0540 04/01/16 0958 04/01/16 1002  BP: 117/63 (!) 115/56    Pulse: 62 89    Resp: 20 20    Temp: 97.7 F (36.5 C) 98.2 F (36.8 C)    TempSrc: Oral Oral    SpO2: 96% 97% 93% 100%  Weight:      Height:        Wt Readings from Last 3 Encounters:  03/31/16 82.4 kg (181 lb 10.5 oz)  01/08/16 82.1 kg (181 lb)  10/15/15 82.6 kg (182 lb)     Intake/Output Summary (Last 24 hours) at 04/01/16 1045 Last data filed at 04/01/16 1000  Gross per 24 hour  Intake           2382.5 ml  Output              800 ml  Net           1582.5 ml     Physical Exam  Awake Alert, Oriented X 3, No new F.N deficits, Normal affect  [place].AT,PERRAL Supple Neck,No JVD, No cervical lymphadenopathy appriciated.  Symmetrical Chest wall movement, Good air movement bilaterally, coarse B sounds RRR,No Gallops,Rubs or new Murmurs, No Parasternal Heave +ve B.Sounds, Abd Soft, No tenderness, No organomegaly appriciated, No rebound - guarding or rigidity. No Cyanosis, Clubbing or edema, No new Rash or bruise       Data Review:    CBC  Recent Labs Lab 03/30/16 1737 03/30/16 2237 03/31/16 0256 04/01/16 0903  WBC 15.2* 15.9* 10.8* 22.0*  HGB 13.5 13.8 12.6* 11.4*  HCT 40.5 41.6 38.5* 35.8*  PLT 352 365 302 374  MCV 92.0 93.3 93.9 96.8  MCH 30.7 30.9 30.7 30.8  MCHC 33.3 33.2 32.7 31.8  RDW 13.2 13.2 13.4 13.8   LYMPHSABS  --  1.1 0.6*  --   MONOABS  --  2.9* 0.5  --   EOSABS  --  0.1 0.1  --   BASOSABS  --  0.1 0.1  --     Chemistries   Recent Labs Lab 03/30/16 1737 03/30/16 1823 03/30/16 2237 03/31/16 0256 04/01/16 0903  NA 135  --  135 137 137  K 3.2*  --  3.6 4.0 4.2  CL 90*  --  99* 100* 98*  CO2 34*  --  29 29 32  GLUCOSE 143*  --  138* 194* 164*  BUN 19  --  15 15 12   CREATININE 0.95  --  0.83 0.91 0.79  CALCIUM 9.1  --  7.5* 8.1* 8.4*  AST  --  42* 40  --   --   ALT  --  52 42  --   --   ALKPHOS  --  134* 104  --   --   BILITOT  --  0.6 0.4  --   --    ------------------------------------------------------------------------------------------------------------------ No results for input(s): CHOL, HDL, LDLCALC, TRIG, CHOLHDL, LDLDIRECT in the last 72 hours.  Lab Results  Component Value Date   HGBA1C 5.5 08/06/2012   ------------------------------------------------------------------------------------------------------------------ No results for input(s): TSH, T4TOTAL, T3FREE, THYROIDAB in the last 72 hours.  Invalid input(s): FREET3 ------------------------------------------------------------------------------------------------------------------ No results for input(s): VITAMINB12, FOLATE, FERRITIN, TIBC, IRON, RETICCTPCT in the last 72 hours.  Coagulation profile  Recent Labs Lab 03/30/16 2237  INR 1.02    No results for input(s): DDIMER in the last 72 hours.  Cardiac Enzymes  Recent Labs Lab 03/30/16 2001 03/31/16 0256 03/31/16 0815  TROPONINI 0.04* 0.03* 0.03*   ------------------------------------------------------------------------------------------------------------------ No results found for: BNP  Micro Results Recent Results (from the past 240 hour(s))  Blood Culture (routine x 2)     Status: None (Preliminary result)   Collection Time: 03/30/16  8:01 PM  Result Value Ref Range Status   Specimen Description RIGHT ANTECUBITAL  Final    Special Requests BOTTLES DRAWN AEROBIC AND ANAEROBIC [place]  Final   Culture NO GROWTH 2 DAYS  Final   Report Status PENDING  Incomplete  Blood Culture (routine x 2)     Status: None (Preliminary result)   Collection Time: 03/30/16  8:15 PM  Result Value Ref Range Status   Specimen Description BLOOD LEFT HAND  Final   Special Requests BOTTLES DRAWN AEROBIC AND ANAEROBIC [place]  Final   Culture NO GROWTH 2 DAYS  Final   Report Status PENDING  Incomplete  Urine culture     Status: None   Collection Time: 03/30/16 10:30 PM  Result Value Ref Range Status   Specimen Description URINE, CLEAN CATCH  Final  Special Requests NONE  Final   Culture   Final    NO GROWTH Performed at [facility], [place] [address], [place], [place] [address]    Report Status 04/01/2016 FINAL  Final  MRSA PCR Screening     Status: None   Collection Time: 03/30/16 10:45 PM  Result Value Ref Range Status   MRSA by PCR NEGATIVE NEGATIVE Final    Comment:        The GeneXpert MRSA Assay (FDA approved for NASAL specimens only), is one component of a comprehensive MRSA colonization surveillance program. It is not intended to diagnose MRSA infection nor to guide or monitor treatment for MRSA infections.     Radiology Reports Dg Chest 2 View  Result Date: 03/30/2016 CLINICAL DATA:  Acute onset of shortness of breath, nausea, vomiting and generalized chest pain. Productive cough. Wheezing. Initial encounter. EXAM: CHEST  2 VIEW COMPARISON:  Chest radiograph performed 01/08/2016 FINDINGS: The lungs are well-aerated. Patchy bilateral airspace opacities raise concern for pneumonia, superimposed on the patient's chronic lung changes and emphysema. There is no evidence of pleural effusion or pneumothorax. The heart is normal in size; the mediastinal contour is within normal limits. No acute osseous abnormalities are seen. IMPRESSION: Patchy bilateral airspace opacities raise concern for pneumonia, superimposed  on the patient's chronic lung changes and emphysema. Electronically Signed   By: Garald Balding M.D.   On: 03/30/2016 19:13   Dg Chest Port 1 View  Result Date: 04/01/2016 CLINICAL DATA:  SOB, pneumonia f/u, pt stated he had robotic lung surgery at [place], hx of lung CA EXAM: PORTABLE CHEST 1 VIEW COMPARISON:  03/31/2016 and prior studies. FINDINGS: Irregular interstitial thickening, most evident in the right lower lung, is stable from the most recent prior study. The irregular interstitial thickening is also noted in the central lungs bilaterally and left lower lung. Hazy airspace opacities also noted in the right lower lung. Changes of emphysema, right greater than left, noted in the upper lungs is stable. No convincing pleural effusion.  No pneumothorax. Cardiac silhouette is normal in size. No mediastinal or hilar masses. IMPRESSION: 1. Findings consistent with combination of interstitial and airspace infiltrates, most evident in the right lower lung, superimposed on chronic changes of COPD. No significant change from most recent prior study. Electronically Signed   By: Wesley Manes M.D.   On: 04/01/2016 08:31   Dg Chest Port 1 View  Result Date: 03/31/2016 CLINICAL DATA:  Shortness of breath . EXAM: PORTABLE CHEST 1 VIEW COMPARISON:  03/30/2016 . FINDINGS: Heart size normal. Diffuse bilateral pulmonary interstitial prominence noted consistent with pneumonitis. COPD. No pleural effusion or pneumothorax. IMPRESSION: Diffuse bilateral pulmonary interstitial prominence noted consistent with pneumonitis. COPD. Electronically Signed   By: Marcello Moores  Register   On: 03/31/2016 07:42    Time Spent in minutes  30   Lala Lund K M.D on 04/01/2016 at 10:45 AM  Between 7am to 7pm - Pager - [phone]  After 7pm go to [email or web address] - password [facility]  [facility] -  Office  [phone]

## 2016-04-02 ENCOUNTER — Inpatient Hospital Stay (HOSPITAL_COMMUNITY): Payer: Medicare HMO

## 2016-04-02 LAB — HEMOGLOBIN A1C
HEMOGLOBIN A1C: 5.6 % (ref 4.8–5.6)
Mean Plasma Glucose: 114 mg/dL

## 2016-04-02 LAB — BASIC METABOLIC PANEL
Anion gap: 6 (ref 5–15)
BUN: 11 mg/dL (ref 6–20)
CALCIUM: 8.3 mg/dL — AB (ref 8.9–10.3)
CO2: 33 mmol/L — ABNORMAL HIGH (ref 22–32)
CREATININE: 0.79 mg/dL (ref 0.61–1.24)
Chloride: 101 mmol/L (ref 101–111)
GFR calc Af Amer: >60 mL/min (ref >60)
Glucose, Bld: 99 mg/dL (ref 65–99)
Potassium: 3.6 mmol/L (ref 3.5–5.1)
SODIUM: 140 mmol/L (ref 135–145)

## 2016-04-02 LAB — CBC
HCT: 34.4 % — ABNORMAL LOW (ref 39.0–52.0)
Hemoglobin: 10.8 g/dL — ABNORMAL LOW (ref 13.0–17.0)
MCH: 29.9 pg (ref 26.0–34.0)
MCHC: 31.4 g/dL (ref 30.0–36.0)
MCV: 95.3 fL (ref 78.0–100.0)
PLATELETS: 382 10*3/uL (ref 150–400)
RBC: 3.61 MIL/uL — AB (ref 4.22–5.81)
RDW: 13.6 % (ref 11.5–15.5)
WBC: 16.1 10*3/uL — ABNORMAL HIGH (ref 4.0–10.5)

## 2016-04-02 LAB — GLUCOSE, CAPILLARY
GLUCOSE-CAPILLARY: 129 mg/dL — AB (ref 65–99)
Glucose-Capillary: 134 mg/dL — ABNORMAL HIGH (ref 65–99)
Glucose-Capillary: 162 mg/dL — ABNORMAL HIGH (ref 65–99)
Glucose-Capillary: 122 mg/dL — ABNORMAL HIGH (ref 65–99)

## 2016-04-02 MED ORDER — LORAZEPAM 2 MG/ML IJ SOLN: 1.0000 mg | Freq: Once | INTRAMUSCULAR | Status: DC | PRN

## 2016-04-02 NOTE — Progress Notes (Signed)
PROGRESS NOTE                                                                                                                                                                                                             Patient Demographics:    Wesley [name], is a 51 y.o. male, DOB - [birth year], [id number]  Admit date - 03/30/2016   Admitting Physician Phillips Grout, MD  Outpatient Primary MD for the patient is [name], MD  LOS - 3  Chief Complaint  Patient presents with  . Shortness of Breath       Brief Narrative  Wesley [name] is a 51 y.o. male with medical history significant of advanced COPD on 3 liters [place] oxygen at home, chronic pain managed at [facility] pain management, post op DVT in 8/17 treated with xaralto now off,  Avascular necrosis of hip s/p repair, pulmonary asbestosis , also with CAP in 11/17 treated with azithro/rocephin as inpatient transitioned to augmentin as outpatient comes in with several days of worsening sob, wheezing and productive cough due to Pneumonia.   Subjective:    Wesley [name] today has, No headache, No chest pain, No abdominal pain - No Nausea, No new weakness tingling or numbness, much improved Cough - SOB.  He tells me on 04-02-16 that about 7 days ago his R.side got weak, has improved now, he forgot to tell it to anyone till today.   Assessment  & Plan :    1.Sepsis due to pneumonia with COPD exacerbation causing acute on chronic hypoxic respiratory failure.   Sepsis physiology has resolved, lactate normal, he appears nontoxic, Has been hydrated, cultures pending, currently on Zosyn and Levaquin combination, improving leukocytosis CT chest and CXR repeat stable. Clinically better today.  2. COPD exacerbation. IV steroids, nebulizer treatments and oxygen. He is on home oxygen. Antibiotics as above. Close to his baseline at this point.  3. Chronic pain. Home regimen  continued.  4. History of DVT. Has finished anticoagulation. Follow with PCP outpatient post discharge. Prophylactic Lovenox while he is here.  5. Patient reports that he got confused last night. Likely mild delirium due to her acute illness and Hospital setting along with steroids being on board, supportive care, he has no headache or focal deficits. Minimize steroids now as COPD exacerbation is improved.  6. ?  R.sided weakness 7 days ago - CT head, ASA, PT, if CT -ve check MRI.    Family Communication  : None  Code Status :  Full  Diet : Diet Heart Room service appropriate? Yes; Fluid consistency: Thin   Disposition Plan  :  Home 1-2 days  Consults  :  None  Procedures  :  None  DVT Prophylaxis  :  Lovenox   Lab Results  Component Value Date   PLT 382 04/02/2016    Inpatient Medications  Scheduled Meds: . albuterol  3 mL Nebulization Q6H  . aspirin  325 mg Oral Daily  . enoxaparin (LOVENOX) injection  40 mg Subcutaneous Q24H  . gabapentin  600 mg Oral TID  . insulin aspart  0-15 Units Subcutaneous TID WC  . insulin aspart  0-5 Units Subcutaneous QHS  . levofloxacin  750 mg Oral Daily  . methylPREDNISolone (SOLU-MEDROL) injection  80 mg Intravenous Daily  . nicotine  21 mg Transdermal Daily  . oxycodone  30 mg Oral Q4H  . oxyCODONE  30 mg Oral Q8H  . piperacillin-tazobactam (ZOSYN)  IV  3.375 g Intravenous Q8H  . tiotropium  18 mcg Inhalation Daily   Continuous Infusions:  PRN Meds:.albuterol, alum & mag hydroxide-simeth, guaiFENesin-dextromethorphan, ipratropium, methocarbamol, ondansetron, polyvinyl alcohol, sodium chloride  Antibiotics  :    Anti-infectives    Start     Dose/Rate Route Frequency Ordered Stop   04/01/16 1000  levofloxacin (LEVAQUIN) tablet 750 mg     750 mg Oral Daily 04/01/16 0752     03/31/16 0600  piperacillin-tazobactam (ZOSYN) IVPB 3.375 g     3.375 g 12.5 mL/hr over 240 Minutes Intravenous Every 8 hours 03/30/16 2159     03/30/16  2200  vancomycin (VANCOCIN) IVPB 1000 mg/200 mL premix  Status:  Discontinued     1,000 mg 200 mL/hr over 60 Minutes Intravenous Every 8 hours 03/30/16 2159 03/30/16 2245   03/30/16 2200  piperacillin-tazobactam (ZOSYN) IVPB 3.375 g     3.375 g 100 mL/hr over 30 Minutes Intravenous  Once 03/30/16 2159 03/30/16 2255   03/30/16 1945  cefTRIAXone (ROCEPHIN) 1 g in dextrose 5 % 50 mL IVPB     1 g 100 mL/hr over 30 Minutes Intravenous  Once 03/30/16 1937 03/30/16 2100   03/30/16 1945  azithromycin (ZITHROMAX) 500 mg in dextrose 5 % 250 mL IVPB     500 mg 250 mL/hr over 60 Minutes Intravenous  Once 03/30/16 1937 03/30/16 2224         Objective:   Vitals:   04/02/16 0002 04/02/16 0519 04/02/16 0523 04/02/16 0801  BP:  113/65    Pulse:  70    Resp:  16    Temp:  98 F (36.7 C)    TempSrc:  Oral    SpO2: 98% 95% 91% 96%  Weight:      Height:        Wt Readings from Last 3 Encounters:  03/31/16 82.4 kg (181 lb 10.5 oz)  01/08/16 82.1 kg (181 lb)  10/15/15 82.6 kg (182 lb)     Intake/Output Summary (Last 24 hours) at 04/02/16 0939 Last data filed at 04/02/16 0900  Gross per 24 hour  Intake             1130 ml  Output             2980 ml  Net            -1850 ml  Physical Exam  Awake Alert, Oriented X 3, No new F.N deficits, Normal affect [place].AT,PERRAL Supple Neck,No JVD, No cervical lymphadenopathy appriciated.  Symmetrical Chest wall movement, Good air movement bilaterally, coarse B sounds RRR,No Gallops,Rubs or new Murmurs, No Parasternal Heave +ve B.Sounds, Abd Soft, No tenderness, No organomegaly appriciated, No rebound - guarding or rigidity. No Cyanosis, Clubbing or edema, No new Rash or bruise       Data Review:    CBC  Recent Labs Lab 03/30/16 1737 03/30/16 2237 03/31/16 0256 04/01/16 0903 04/02/16 0634  WBC 15.2* 15.9* 10.8* 22.0* 16.1*  HGB 13.5 13.8 12.6* 11.4* 10.8*  HCT 40.5 41.6 38.5* 35.8* 34.4*  PLT 352 365 302 374 382  MCV 92.0 93.3  93.9 96.8 95.3  MCH 30.7 30.9 30.7 30.8 29.9  MCHC 33.3 33.2 32.7 31.8 31.4  RDW 13.2 13.2 13.4 13.8 13.6  LYMPHSABS  --  1.1 0.6*  --   --   MONOABS  --  2.9* 0.5  --   --   EOSABS  --  0.1 0.1  --   --   BASOSABS  --  0.1 0.1  --   --     Chemistries   Recent Labs Lab 03/30/16 1737 03/30/16 1823 03/30/16 2237 03/31/16 0256 04/01/16 0903 04/02/16 0634  NA 135  --  135 137 137 140  K 3.2*  --  3.6 4.0 4.2 3.6  CL 90*  --  99* 100* 98* 101  CO2 34*  --  29 29 32 33*  GLUCOSE 143*  --  138* 194* 164* 99  BUN 19  --  15 15 12 11   CREATININE 0.95  --  0.83 0.91 0.79 0.79  CALCIUM 9.1  --  7.5* 8.1* 8.4* 8.3*  AST  --  42* 40  --   --   --   ALT  --  52 42  --   --   --   ALKPHOS  --  134* 104  --   --   --   BILITOT  --  0.6 0.4  --   --   --    ------------------------------------------------------------------------------------------------------------------ No results for input(s): CHOL, HDL, LDLCALC, TRIG, CHOLHDL, LDLDIRECT in the last 72 hours.  Lab Results  Component Value Date   HGBA1C 5.5 08/06/2012   ------------------------------------------------------------------------------------------------------------------ No results for input(s): TSH, T4TOTAL, T3FREE, THYROIDAB in the last 72 hours.  Invalid input(s): FREET3 ------------------------------------------------------------------------------------------------------------------ No results for input(s): VITAMINB12, FOLATE, FERRITIN, TIBC, IRON, RETICCTPCT in the last 72 hours.  Coagulation profile  Recent Labs Lab 03/30/16 2237  INR 1.02    No results for input(s): DDIMER in the last 72 hours.  Cardiac Enzymes  Recent Labs Lab 03/30/16 2001 03/31/16 0256 03/31/16 0815  TROPONINI 0.04* 0.03* 0.03*   ------------------------------------------------------------------------------------------------------------------ No results found for: BNP  Micro Results Recent Results (from the past 240 hour(s))   Blood Culture (routine x 2)     Status: None (Preliminary result)   Collection Time: 03/30/16  8:01 PM  Result Value Ref Range Status   Specimen Description RIGHT ANTECUBITAL  Final   Special Requests BOTTLES DRAWN AEROBIC AND ANAEROBIC 6CC EACH  Final   Culture NO GROWTH 2 DAYS  Final   Report Status PENDING  Incomplete  Blood Culture (routine x 2)     Status: None (Preliminary result)   Collection Time: 03/30/16  8:15 PM  Result Value Ref Range Status   Specimen Description BLOOD LEFT HAND  Final   Special  Requests BOTTLES DRAWN AEROBIC AND ANAEROBIC [place]  Final   Culture NO GROWTH 2 DAYS  Final   Report Status PENDING  Incomplete  Urine culture     Status: None   Collection Time: 03/30/16 10:30 PM  Result Value Ref Range Status   Specimen Description URINE, CLEAN CATCH  Final   Special Requests NONE  Final   Culture   Final    NO GROWTH Performed at [facility], [place] [address] [place], [place] [address]    Report Status 04/01/2016 FINAL  Final  MRSA PCR Screening     Status: None   Collection Time: 03/30/16 10:45 PM  Result Value Ref Range Status   MRSA by PCR NEGATIVE NEGATIVE Final    Comment:        The GeneXpert MRSA Assay (FDA approved for NASAL specimens only), is one component of a comprehensive MRSA colonization surveillance program. It is not intended to diagnose MRSA infection nor to guide or monitor treatment for MRSA infections.     Radiology Reports Dg Chest 2 View  Result Date: 04/02/2016 CLINICAL DATA:  Community acquired pneumonia EXAM: CHEST  2 VIEW COMPARISON:  04/01/2016 FINDINGS: Patchy airspace opacities in both lungs, right greater than left compatible with pneumonia. Underlying COPD. Heart is normal size. No effusions. IMPRESSION: Patchy bilateral airspace disease compatible with multifocal pneumonia. COPD. Electronically Signed   By: Rolm Baptise M.D.   On: 04/02/2016 09:33   Dg Chest 2 View  Result Date: 03/30/2016 CLINICAL  DATA:  Acute onset of shortness of breath, nausea, vomiting and generalized chest pain. Productive cough. Wheezing. Initial encounter. EXAM: CHEST  2 VIEW COMPARISON:  Chest radiograph performed 01/08/2016 FINDINGS: The lungs are well-aerated. Patchy bilateral airspace opacities raise concern for pneumonia, superimposed on the patient's chronic lung changes and emphysema. There is no evidence of pleural effusion or pneumothorax. The heart is normal in size; the mediastinal contour is within normal limits. No acute osseous abnormalities are seen. IMPRESSION: Patchy bilateral airspace opacities raise concern for pneumonia, superimposed on the patient's chronic lung changes and emphysema. Electronically Signed   By: Garald Balding M.D.   On: 03/30/2016 19:13   Ct Chest Wo Contrast  Result Date: 04/01/2016 CLINICAL DATA:  51 year old male with worsening shortness of breath. EXAM: CT CHEST WITHOUT CONTRAST TECHNIQUE: Multidetector CT imaging of the chest was performed following the standard protocol without IV contrast. COMPARISON:  04/16/2015 and prior CTs. 04/01/2016 and prior chest radiographs FINDINGS: Cardiovascular: Prominent central pulmonary arteries noted. There is no evidence of thoracic aortic aneurysm. No pericardial effusion identified. Mediastinum/Nodes: Unchanged mildly prominent right hilar lymph nodes. No new enlarged or suspicious lymph nodes identified. Lungs/Pleura: Severe emphysema again noted with extensive bullous changes in the upper lungs. There are scattered diffuse tree-in-bud opacities within both lungs, right greater than left, suspicious for infection. Scarring within the lingula and anterior right upper lobe/ right middle lobe noted. A 1.5 cm peripheral right lower lobe nodular opacity abutting the pleura (image 128) is identified. There is no evidence of pleural effusion or pneumothorax. Upper Abdomen: No acute abnormality Musculoskeletal: No acute or suspicious abnormalities.  IMPRESSION: Scattered diffuse bilateral tree-in-bud opacities, right greater than left, likely representing infection. 1.5 cm peripheral right lower lobe nodular opacity -more likely represent to represent infection or atelectasis, but short-term CT follow-up in 3 months recommended. Severe emphysema and evidence of pulmonary arterial hypertension. Electronically Signed   By: Margarette Canada M.D.   On: 04/01/2016 12:27  Dg Chest Port 1 View  Result Date: 04/01/2016 CLINICAL DATA:  SOB, pneumonia f/u, pt stated he had robotic lung surgery at [facility], hx of lung CA EXAM: PORTABLE CHEST 1 VIEW COMPARISON:  03/31/2016 and prior studies. FINDINGS: Irregular interstitial thickening, most evident in the right lower lung, is stable from the most recent prior study. The irregular interstitial thickening is also noted in the central lungs bilaterally and left lower lung. Hazy airspace opacities also noted in the right lower lung. Changes of emphysema, right greater than left, noted in the upper lungs is stable. No convincing pleural effusion.  No pneumothorax. Cardiac silhouette is normal in size. No mediastinal or hilar masses. IMPRESSION: 1. Findings consistent with combination of interstitial and airspace infiltrates, most evident in the right lower lung, superimposed on chronic changes of COPD. No significant change from most recent prior study. Electronically Signed   By: Wesley Manes M.D.   On: 04/01/2016 08:31   Dg Chest Port 1 View  Result Date: 03/31/2016 CLINICAL DATA:  Shortness of breath . EXAM: PORTABLE CHEST 1 VIEW COMPARISON:  03/30/2016 . FINDINGS: Heart size normal. Diffuse bilateral pulmonary interstitial prominence noted consistent with pneumonitis. COPD. No pleural effusion or pneumothorax. IMPRESSION: Diffuse bilateral pulmonary interstitial prominence noted consistent with pneumonitis. COPD. Electronically Signed   By: Marcello Moores  Register   On: 03/31/2016 07:42    Time Spent in minutes   30   Lala Lund K M.D on 04/02/2016 at 9:39 AM  Between 7am to 7pm - Pager - [phone]  After 7pm go to [email or web address] - password [facility]  [facility] -  Office  [phone]

## 2016-04-02 NOTE — Progress Notes (Signed)
Pharmacy Antibiotic Note  Wesley [name] is a 51 y.o. male admitted on 03/30/2016 with pneumonia.  Pharmacy has been consulted for Levaquin and Zosyn  dosing.  Plan: Levaquin 750mg  PO q24hrs Zosyn 3.375gm IV every 8 hours. Follow-up micro data, labs, vitals.  Height: 6\' 1"  (185.4 cm) Weight: 181 lb 10.5 oz (82.4 kg) IBW/kg (Calculated) : 79.9  Temp (24hrs), Avg:98.3 F (36.8 C), Min:97.8 F (36.6 C), Max:99.2 F (37.3 C)   Recent Labs Lab 03/30/16 1737 03/30/16 2001 03/30/16 2225 03/30/16 2237 03/31/16 0256 04/01/16 0903 04/02/16 0634  WBC 15.2*  --   --  15.9* 10.8* 22.0* 16.1*  CREATININE 0.95  --   --  0.83 0.91 0.79 0.79  LATICACIDVEN  --  0.8 1.0  --  0.7  --   --     Estimated Creatinine Clearance: 122.1 mL/min (by C-G formula based on SCr of 0.79 mg/dL).    Allergies  Allergen Reactions  . Ambien [Zolpidem Tartrate] Other (See Comments)    hallucinations  . Melatonin Itching  . Zolpidem Other (See Comments)    Altered mental status   Antimicrobials this admission: Vanc 1/25 >> 1/27 Zosyn 1/25 >>  Levaquin 1/27 >>  Dose adjustments this admission: n/a   Microbiology results: 1/25 BCx: pending  Thank you for allowing pharmacy to be a part of this patient's care.  Hart Robinsons A 04/02/2016 2:06 PM

## 2016-04-03 ENCOUNTER — Inpatient Hospital Stay (HOSPITAL_COMMUNITY): Payer: Medicare HMO

## 2016-04-03 LAB — GLUCOSE, CAPILLARY
Glucose-Capillary: 91 mg/dL (ref 65–99)
Glucose-Capillary: 102 mg/dL — ABNORMAL HIGH (ref 65–99)
Glucose-Capillary: 140 mg/dL — ABNORMAL HIGH (ref 65–99)

## 2016-04-03 MED ORDER — PREDNISONE 5 MG PO TABS: ORAL_TABLET | ORAL | 0 refills | Status: DC

## 2016-04-03 MED ORDER — LEVOFLOXACIN 750 MG PO TABS: 750.0000 mg | ORAL_TABLET | Freq: Every day | ORAL | 0 refills | Status: DC

## 2016-04-03 NOTE — Care Management Important Message (Signed)
Important Message  Patient Details  Name: Wesley [name] MRN: [id number] Date of Birth: [birth year]   Medicare Important Message Given:  Yes    Sherald Barge, RN 04/03/2016, 12:45 PM

## 2016-04-03 NOTE — Care Management Note (Signed)
Case Management Note  Patient Details  Name: Wesley [name] MRN: [id number] Date of Birth: Feb 09, 1966  Subjective/Objective:                  Pt admitted with sepsis. He is from home, lives with wife and is ind with ADL's. He has supplemental oxygen at home PTA. She has neb machine >53 yrs old. He has ambulates ind of assistive devices. PT has recommended HH PT. Pt is agreeable and has chosen [facility] from list of [facility] providers. Pt will be ordered new neb machine. Pt would like that from [facility] also. Romualdo Bolk, of [facility] aware of referral and will obtain pt info from chart. She will deliver DME to pt room prior to DC. Pt is aware that [facility] has 48hrs to initiate services.   Action/Plan: Discharging home today with new neb machine and HH PT through [facility].   Expected Discharge Date:  04/03/16               Expected Discharge Plan:  [place]  In-House Referral:  NA  Discharge planning Services  CM Consult  Post Acute Care Choice:  Durable Medical Equipment, Home Health Choice offered to:  Patient  DME Arranged:  Nebulizer/meds DME Agency:  [place] Arranged:  PT, RN [facility] Agency:  [place]  Status of Service:  Completed, signed off  Sherald Barge, RN 04/03/2016, 12:42 PM

## 2016-04-03 NOTE — Discharge Instructions (Signed)
Follow with Primary MD [name], MD in 2-3 days, you need [place] repeat CT scan after chest in 3 months to evaluate lung nodule.   Get CBC, CMP, 2 view Chest X ray checked  by Primary MD in 2-3 days ( we routinely change or add medications that can affect your baseline labs and fluid status, therefore we recommend that you get the mentioned basic workup next visit with your PCP, your PCP may decide not to get them or add new tests based on their clinical decision)   Activity: As tolerated with Full fall precautions use walker/cane & assistance as needed   Disposition Home     Diet:   Diet Heart Room service appropriate? Yes; Fluid consistency: Thin ** Heart Healthy ** , with feeding assistance and aspiration precautions.  For Heart failure patients - Check your Weight same time everyday, if you gain over 2 pounds, or you develop in leg swelling, experience more shortness of breath or chest pain, call your Primary MD immediately. Follow Cardiac Low Salt Diet and 1.5 lit/day fluid restriction.   On your next visit with your primary care physician please Get Medicines reviewed and adjusted.   Please request your Prim.MD to go over all Hospital Tests and Procedure/Radiological results at the follow up, please get all Hospital records sent to your Prim MD by signing hospital release before you go home.   If you experience worsening of your admission symptoms, develop shortness of breath, life threatening emergency, suicidal or homicidal thoughts you must seek medical attention immediately by calling 911 or calling your MD immediately  if symptoms less severe.  You Must read complete instructions/literature along with all the possible adverse reactions/side effects for all the Medicines you take and that have been prescribed to you. Take any new Medicines after you have completely understood and accpet all the possible adverse reactions/side effects.   Do not drive, operate heavy machinery,  perform activities at heights, swimming or participation in water activities or provide baby sitting services if your were admitted for syncope or siezures until you have seen by Primary MD or a Neurologist and advised to do so again.  Do not drive when taking Pain medications.    Do not take more than prescribed Pain, Sleep and Anxiety Medications  Special Instructions: If you have smoked or chewed Tobacco  in the last 2 yrs please stop smoking, stop any regular Alcohol  and or any Recreational drug use.  Wear Seat belts while driving.   Please note  You were cared for by a hospitalist during your hospital stay. If you have any questions about your discharge medications or the care you received while you were in the hospital after you are discharged, you can call the unit and asked to speak with the hospitalist on call if the hospitalist that took care of you is not available. Once you are discharged, your primary care physician will handle any further medical issues. Please note that NO REFILLS for any discharge medications will be authorized once you are discharged, as it is imperative that you return to your primary care physician (or establish a relationship with a primary care physician if you do not have one) for your aftercare needs so that they can reassess your need for medications and monitor your lab values.

## 2016-04-03 NOTE — Discharge Summary (Addendum)
Wesley [name] ZOX:[id number] DOB: 03/30/1965 DOA: 03/30/2016  PCP: [name], MD  Admit date: 03/30/2016  Discharge date: 04/03/2016  Admitted From: Home   Disposition:  Home   Recommendations for Outpatient Follow-up:   Follow up with PCP in 1-2 weeks  PCP Please obtain BMP/CBC, 2 view CXR in 1week,  (see Discharge instructions)   PCP Please follow up on the following pending results: Please evaluate chest CT results he will require repeat chest CT in 3 months for lung nodule evaluation.   Home Health: None  Equipment/Devices: None  Consultations: None Discharge Condition: Fair   CODE STATUS: Full   Diet Recommendation: Heart Healthy    Chief Complaint  Patient presents with  . Shortness of Breath     Brief history of present illness from the day of admission and additional interim summary    Wesley [name] a 50 y.o.malewith medical history significant of advanced COPD on 3 liters [place] oxygen at home, chronic pain managed at [facility] pain management, post op DVT in 8/17 treated with xaralto now off, Avascular necrosis of hip s/p repair, pulmonary asbestosis , also with CAP in 11/17 treated with azithro/rocephin as inpatient transitioned to augmentin as outpatient comes in with several days of worsening sob, wheezing and productive cough due to Pneumonia.                                                                 Hospital Course    1.Sepsis due to pneumonia with COPD exacerbation causing acute on chronic hypoxic respiratory failure.   Sepsis physiology has resolved, lactate normal, he appears nontoxic, Has been hydrated, cultures A negative, treated with Zosyn and Levaquin combination, improving leukocytosis CT chest and CXR repeat stable. Clinically better today, will place on 5 more days of  oral Levaquin and discharged home with outpatient follow-up with PCP and primary pulmonologist at [facility]. Request PCP to do CBC, BMP and a 2 view chest x-ray next visit.  2. COPD exacerbation. IV steroids, nebulizer treatments and oxygen. He is on home oxygen. Antibiotics as above. Close to his baseline at this point.  3. Chronic pain. Home regimen continued.  4. History of DVT. Has finished anticoagulation. Follow with PCP outpatient post discharge. Prophylactic Lovenox while he is here.  5. Patient reports that he got confused during nighttime. Likely mild delirium due to acute illness and Hospital setting along with steroids being on board, supportive care, he has no headache or focal deficits. In my interview his awake alert oriented 4.  6. ? R.sided weakness 7 days ago - his history and his exam were very inconsistent, he told be on 04/02/2016 that about 7 days ago he had right-sided weakness which she forgot to mention to anyone tell 04/02/2016, his exam was very inconsistent and  so was his history, his head CT and MRI brain both negative, he is on full dose aspirin which will be continued with outpatient follow-up with PCP for secondary risk factor modulation. He does have some deconditioning for which home PT will be ordered.  7. Lung nodule on CT chest. Needs repeat CT scan in 3 months.   Note patient was discharged 10 AM this morning, he was told yesterday that he will be discharged before noontime this morning. Her discharge contact me saying that he does not have transportation after 11 AM today, he was told that he has already been discharged, he then told me that he will prefer going home tomorrow as he feels that his transportation situation is not good. I informed him that case management will arrange for transportation, he then told me that it was not required and he will arrange it.    Discharge diagnosis     Principal Problem:   Sepsis ([place]) Active Problems:    Acute-on-chronic respiratory failure (HCC)   Chronic pain   Opiate dependence (HCC)   COPD exacerbation (HCC)   History of DVT (deep vein thrombosis)   HCAP (healthcare-associated pneumonia)    Discharge instructions    Discharge Instructions    Diet - low sodium heart healthy    Complete by:  As directed    Discharge instructions    Complete by:  As directed    Follow with Primary MD [name], MD in 2-3 days, you need [place] repeat CT scan after chest in 3 months to evaluate lung nodule.    Get CBC, CMP, 2 view Chest X ray checked  by Primary MD in 2-3 days ( we routinely change or add medications that can affect your baseline labs and fluid status, therefore we recommend that you get the mentioned basic workup next visit with your PCP, your PCP may decide not to get them or add new tests based on their clinical decision)   Activity: As tolerated with Full fall precautions use walker/cane & assistance as needed   Disposition Home     Diet:   Diet Heart Room service appropriate? Yes; Fluid consistency: Thin ** Heart Healthy ** , with feeding assistance and aspiration precautions.  For Heart failure patients - Check your Weight same time everyday, if you gain over 2 pounds, or you develop in leg swelling, experience more shortness of breath or chest pain, call your Primary MD immediately. Follow Cardiac Low Salt Diet and 1.5 lit/day fluid restriction.   On your next visit with your primary care physician please Get Medicines reviewed and adjusted.   Please request your Prim.MD to go over all Hospital Tests and Procedure/Radiological results at the follow up, please get all Hospital records sent to your Prim MD by signing hospital release before you go home.   If you experience worsening of your admission symptoms, develop shortness of breath, life threatening emergency, suicidal or homicidal thoughts you must seek medical attention immediately by calling 911 or calling your MD  immediately  if symptoms less severe.  You Must read complete instructions/literature along with all the possible adverse reactions/side effects for all the Medicines you take and that have been prescribed to you. Take any new Medicines after you have completely understood and accpet all the possible adverse reactions/side effects.   Do not drive, operate heavy machinery, perform activities at heights, swimming or participation in water activities or provide baby sitting services if your were admitted for syncope or siezures  until you have seen by Primary MD or a Neurologist and advised to do so again.  Do not drive when taking Pain medications.    Do not take more than prescribed Pain, Sleep and Anxiety Medications  Special Instructions: If you have smoked or chewed Tobacco  in the last 2 yrs please stop smoking, stop any regular Alcohol  and or any Recreational drug use.  Wear Seat belts while driving.   Please note  You were cared for by a hospitalist during your hospital stay. If you have any questions about your discharge medications or the care you received while you were in the hospital after you are discharged, you can call the unit and asked to speak with the hospitalist on call if the hospitalist that took care of you is not available. Once you are discharged, your primary care physician will handle any further medical issues. Please note that NO REFILLS for any discharge medications will be authorized once you are discharged, as it is imperative that you return to your primary care physician (or establish a relationship with a primary care physician if you do not have one) for your aftercare needs so that they can reassess your need for medications and monitor your lab values.   Increase activity slowly    Complete by:  As directed       Discharge Medications   Allergies as of 04/03/2016      Reactions   Ambien [zolpidem Tartrate] Other (See Comments)   hallucinations    Melatonin Itching   Zolpidem Other (See Comments)   Altered mental status      Medication List    STOP taking these medications   amoxicillin-clavulanate 875-125 MG tablet Commonly known as:  AUGMENTIN     TAKE these medications   ADVAIR DISKUS 250-50 MCG/DOSE Aepb Generic drug:  Fluticasone-Salmeterol Inhale 1 puff into the lungs 2 (two) times daily.   albuterol (2.5 MG/3ML) 0.083% nebulizer solution Commonly known as:  PROVENTIL Take 3 mLs (2.5 mg total) by nebulization every 6 (six) hours as needed for wheezing or shortness of breath. What changed:  when to take this   albuterol 108 (90 Base) MCG/ACT inhaler Commonly known as:  PROVENTIL HFA;VENTOLIN HFA Inhale 2 puffs into the lungs every 4 (four) hours as needed for wheezing or shortness of breath. Shortness of breath What changed:  Another medication with the same name was changed. Make sure you understand how and when to take each.   aspirin 325 MG tablet Take 325 mg by mouth daily.   budesonide-formoterol 160-4.5 MCG/ACT inhaler Commonly known as:  SYMBICORT Inhale 3 puffs into the lungs 4 (four) times daily.   furosemide 40 MG tablet Commonly known as:  LASIX Take 40 mg by mouth daily.   gabapentin 300 MG capsule Commonly known as:  NEURONTIN Take 600 mg by mouth 3 (three) times daily.   levofloxacin 750 MG tablet Commonly known as:  LEVAQUIN Take 1 tablet (750 mg total) by mouth daily.   methocarbamol 500 MG tablet Commonly known as:  ROBAXIN Take 1 tablet (500 mg total) by mouth every 6 (six) hours as needed for muscle spasms.   NARCAN 4 MG/0.1ML Liqd nasal spray kit Generic drug:  naloxone Place 4 mg into the nose as directed. To prevent overdose   nicotine 21 mg/24hr patch Commonly known as:  NICODERM CQ - dosed in mg/24 hours Place 21 mg onto the skin daily.   ondansetron 4 MG disintegrating tablet Commonly known as:  ZOFRAN ODT Take 1 tablet (4 mg total) by mouth every 8 (eight) hours as  needed for nausea or vomiting.   OXYCONTIN 30 MG 12 hr tablet Generic drug:  oxyCODONE Take 30 mg by mouth every 8 (eight) hours.   oxycodone 30 MG immediate release tablet Commonly known as:  ROXICODONE Take 30 mg by mouth every 4 (four) hours.   Oxycodone HCl 20 MG Tabs Take 1 tablet (20 mg total) by mouth 3 (three) times daily.   OXYGEN Inhale 3 L into the lungs daily as needed.   predniSONE 5 MG tablet Commonly known as:  DELTASONE Label  & dispense according to the schedule below. 10 Pills PO for 3 days then, 8 Pills PO for 3 days, 6 Pills PO for 3 days, 4 Pills PO for 3 days, 2 Pills PO for 3 days, 1 Pills PO for 3 days, 1/2 Pill  PO for 3 days then STOP. Total 95 pills.   SYSTANE 0.4-0.3 % Soln Generic drug:  Polyethyl Glycol-Propyl Glycol Place 1-2 drops into the left eye daily as needed (for dry eye relief).   tiotropium 18 MCG inhalation capsule Commonly known as:  SPIRIVA Place 1 capsule (18 mcg total) into inhaler and inhale daily.            Durable Medical Equipment        Start     Ordered   04/03/16 1049  For home use only DME Nebulizer machine  Once    Question:  Patient needs a nebulizer to treat with the following condition  Answer:  COPD (chronic obstructive pulmonary disease) ([place])   04/03/16 1050      Follow-up Information    [name], MD. Schedule an appointment as soon as possible for a visit in 2 day(s).   Specialty:  Internal Medicine Why:  follw with your pulmonologist within a week Contact information: [place] [address] [phone]           Major procedures and Radiology Reports - PLEASE review detailed and final reports thoroughly  -       Dg Chest 2 View  Result Date: 04/02/2016 CLINICAL DATA:  Community acquired pneumonia EXAM: CHEST  2 VIEW COMPARISON:  04/01/2016 FINDINGS: Patchy airspace opacities in both lungs, right greater than left compatible with pneumonia. Underlying COPD. Heart is  normal size. No effusions. IMPRESSION: Patchy bilateral airspace disease compatible with multifocal pneumonia. COPD. Electronically Signed   By: Rolm Baptise M.D.   On: 04/02/2016 09:33   Dg Chest 2 View  Result Date: 03/30/2016 CLINICAL DATA:  Acute onset of shortness of breath, nausea, vomiting and generalized chest pain. Productive cough. Wheezing. Initial encounter. EXAM: CHEST  2 VIEW COMPARISON:  Chest radiograph performed 01/08/2016 FINDINGS: The lungs are well-aerated. Patchy bilateral airspace opacities raise concern for pneumonia, superimposed on the patient's chronic lung changes and emphysema. There is no evidence of pleural effusion or pneumothorax. The heart is normal in size; the mediastinal contour is within normal limits. No acute osseous abnormalities are seen. IMPRESSION: Patchy bilateral airspace opacities raise concern for pneumonia, superimposed on the patient's chronic lung changes and emphysema. Electronically Signed   By: Garald Balding M.D.   On: 03/30/2016 19:13   Ct Head Wo Contrast  Result Date: 04/02/2016 CLINICAL DATA:  Encephalopathy. EXAM: CT HEAD WITHOUT CONTRAST TECHNIQUE: Contiguous axial images were obtained from the base of the skull through the vertex without intravenous contrast. COMPARISON:  01/12/2009 FINDINGS: Brain:  No acute intracranial abnormality. Specifically, no hemorrhage, hydrocephalus, mass lesion, acute infarction, or significant intracranial injury. Vascular: No hyperdense vessel or unexpected calcification. Skull: Insert calvarium Sinuses/Orbits: Visualized paranasal sinuses and mastoids clear. Orbital soft tissues unremarkable. Other: None IMPRESSION: No acute intracranial abnormality. Electronically Signed   By: Rolm Baptise M.D.   On: 04/02/2016 09:37   Ct Chest Wo Contrast  Result Date: 04/01/2016 CLINICAL DATA:  51 year old male with worsening shortness of breath. EXAM: CT CHEST WITHOUT CONTRAST TECHNIQUE: Multidetector CT imaging of the chest  was performed following the standard protocol without IV contrast. COMPARISON:  04/16/2015 and prior CTs. 04/01/2016 and prior chest radiographs FINDINGS: Cardiovascular: Prominent central pulmonary arteries noted. There is no evidence of thoracic aortic aneurysm. No pericardial effusion identified. Mediastinum/Nodes: Unchanged mildly prominent right hilar lymph nodes. No new enlarged or suspicious lymph nodes identified. Lungs/Pleura: Severe emphysema again noted with extensive bullous changes in the upper lungs. There are scattered diffuse tree-in-bud opacities within both lungs, right greater than left, suspicious for infection. Scarring within the lingula and anterior right upper lobe/ right middle lobe noted. A 1.5 cm peripheral right lower lobe nodular opacity abutting the pleura (image 128) is identified. There is no evidence of pleural effusion or pneumothorax. Upper Abdomen: No acute abnormality Musculoskeletal: No acute or suspicious abnormalities. IMPRESSION: Scattered diffuse bilateral tree-in-bud opacities, right greater than left, likely representing infection. 1.5 cm peripheral right lower lobe nodular opacity -more likely represent to represent infection or atelectasis, but short-term CT follow-up in 3 months recommended. Severe emphysema and evidence of pulmonary arterial hypertension. Electronically Signed   By: Margarette Canada M.D.   On: 04/01/2016 12:27   Mr Brain Wo Contrast  Result Date: 04/03/2016 CLINICAL DATA:  Headache and weakness for 2 days. EXAM: MRI HEAD WITHOUT CONTRAST TECHNIQUE: Multiplanar, multiecho pulse sequences of the brain and surrounding structures were obtained without intravenous contrast. COMPARISON:  Head CT from yesterday 2004 brain MRI not available for review. FINDINGS: Brain: No acute infarction, hemorrhage, hydrocephalus, extra-axial collection or mass lesion. Normal brain volume. No white matter disease. No chronic blood products. Vascular: Preserved flow voids  Skull and upper cervical spine: No evidence of marrow lesion Sinuses/Orbits: Negative Other: Intermittent motion artifact which could obscure subtle pathology. IMPRESSION: Negative motion degraded exam.  No explanation for headache. Electronically Signed   By: Monte Fantasia M.D.   On: 04/03/2016 08:44   Dg Chest Port 1 View  Result Date: 04/01/2016 CLINICAL DATA:  SOB, pneumonia f/u, pt stated he had robotic lung surgery at [place], hx of lung CA EXAM: PORTABLE CHEST 1 VIEW COMPARISON:  03/31/2016 and prior studies. FINDINGS: Irregular interstitial thickening, most evident in the right lower lung, is stable from the most recent prior study. The irregular interstitial thickening is also noted in the central lungs bilaterally and left lower lung. Hazy airspace opacities also noted in the right lower lung. Changes of emphysema, right greater than left, noted in the upper lungs is stable. No convincing pleural effusion.  No pneumothorax. Cardiac silhouette is normal in size. No mediastinal or hilar masses. IMPRESSION: 1. Findings consistent with combination of interstitial and airspace infiltrates, most evident in the right lower lung, superimposed on chronic changes of COPD. No significant change from most recent prior study. Electronically Signed   By: Lajean Manes M.D.   On: 04/01/2016 08:31   Dg Chest Port 1 View  Result Date: 03/31/2016 CLINICAL DATA:  Shortness of breath . EXAM: PORTABLE CHEST 1 VIEW COMPARISON:  03/30/2016 .  FINDINGS: Heart size normal. Diffuse bilateral pulmonary interstitial prominence noted consistent with pneumonitis. COPD. No pleural effusion or pneumothorax. IMPRESSION: Diffuse bilateral pulmonary interstitial prominence noted consistent with pneumonitis. COPD. Electronically Signed   By: Marcello Moores  Register   On: 03/31/2016 07:42    Micro Results     Recent Results (from the past 240 hour(s))  Blood Culture (routine x 2)     Status: None (Preliminary result)   Collection Time:  03/30/16  8:01 PM  Result Value Ref Range Status   Specimen Description RIGHT ANTECUBITAL  Final   Special Requests BOTTLES DRAWN AEROBIC AND ANAEROBIC [place]  Final   Culture NO GROWTH 4 DAYS  Final   Report Status PENDING  Incomplete  Blood Culture (routine x 2)     Status: None (Preliminary result)   Collection Time: 03/30/16  8:15 PM  Result Value Ref Range Status   Specimen Description BLOOD LEFT HAND  Final   Special Requests BOTTLES DRAWN AEROBIC AND ANAEROBIC [place]  Final   Culture NO GROWTH 4 DAYS  Final   Report Status PENDING  Incomplete  Urine culture     Status: None   Collection Time: 03/30/16 10:30 PM  Result Value Ref Range Status   Specimen Description URINE, CLEAN CATCH  Final   Special Requests NONE  Final   Culture   Final    NO GROWTH Performed at [facility], [place] [address] [place], [place] [address]    Report Status 04/01/2016 FINAL  Final  MRSA PCR Screening     Status: None   Collection Time: 03/30/16 10:45 PM  Result Value Ref Range Status   MRSA by PCR NEGATIVE NEGATIVE Final    Comment:        The GeneXpert MRSA Assay (FDA approved for NASAL specimens only), is one component of a comprehensive MRSA colonization surveillance program. It is not intended to diagnose MRSA infection nor to guide or monitor treatment for MRSA infections.     Today   Subjective    Wesley [name] today has no headache,no chest abdominal pain,no new weakness tingling or numbness, feels much better wants to go home today.     Objective   Blood pressure (!) 108/59, pulse 91, temperature 98.1 F (36.7 C), temperature source Oral, resp. rate 20, height _0  (1.854 m), weight 82.4 kg (181 lb 10.5 oz), SpO2 92 %.   Intake/Output Summary (Last 24 hours) at 04/03/16 1051 Last data filed at 04/03/16 4010  Gross per 24 hour  Intake              610 ml  Output             3452 ml  Net            -2842 ml    Exam Awake Alert, Oriented x 3, No new F.N  deficits, dramatic affect [place].AT,PERRAL Supple Neck,No JVD, No cervical lymphadenopathy appriciated.  Symmetrical Chest wall movement, Good air movement bilaterally, mild bilat wheezing RRR,No Gallops,Rubs or new Murmurs, No Parasternal Heave +ve B.Sounds, Abd Soft, Non tender, No organomegaly appriciated, No rebound -guarding or rigidity. No Cyanosis, Clubbing or edema, No new Rash or bruise   Data Review   CBC w Diff:  Lab Results  Component Value Date   WBC 16.1 (H) 04/02/2016   HGB 10.8 (L) 04/02/2016   HCT 34.4 (L) 04/02/2016   PLT 382 04/02/2016   LYMPHOPCT 6 03/31/2016   MONOPCT 5 03/31/2016  EOSPCT 1 03/31/2016   BASOPCT 1 03/31/2016    CMP:  Lab Results  Component Value Date   NA 140 04/02/2016   K 3.6 04/02/2016   CL 101 04/02/2016   CO2 33 (H) 04/02/2016   BUN 11 04/02/2016   CREATININE 0.79 04/02/2016   PROT 5.4 (L) 03/30/2016   ALBUMIN 2.3 (L) 03/30/2016   BILITOT 0.4 03/30/2016   ALKPHOS 104 03/30/2016   AST 40 03/30/2016   ALT 42 03/30/2016  .   Total Time in preparing paper work, data evaluation and todays exam - 35 minutes  Thurnell Lose M.D on 04/03/2016 at 10:51 AM  [facility]   Office  [phone]

## 2016-04-03 NOTE — Evaluation (Signed)
Physical Therapy Evaluation Patient Details Name: Wesley [name] MRN: [id number] DOB: 10/25/1965 Today's Date: 04/03/2016   History of Present Illness  51 y.o. male with medical history significant of advanced COPD on 3 liters [place] oxygen at home, chronic pain managed at [facility] pain management, post op DVT in 8/17 treated with xaralto now off,  Avascular necrosis of hip s/p repair, pulmonary asbestosis , also with CAP in 11/17 treated with azithro/rocephin as inpatient transitioned to augmentin as outpatient comes in with several days of worsening sob, wheezing and productive cough.  Pt reports his sob began about a week ago and he was wheezing much more, he doubled his neb treatments.  This helped some.  In the last 2 or so days he has been having subjective fever and his wheezing and sob got much worse.  He has vomited several times from the coughing.  The cough is productive but nonbloody.  He has not been eating or drinking well.  He has not been on steriods recently.  He has been having chest pain associated with coughing, no le edema or swelling or pain.  Pt referred for admission after found to have pna and hypotension in the ED, his bp has improved with ivf in the ED and his breathing has also gotten much better with several nebs in the ED.  Dx: Sepsis - due to PNA.  Clinical Impression  Pt received in bed, and was agreeable to PT evaluation.  Pt expressed that he occasionally uses the cane for ambulation, and he is normally independent with dressing and bathing.  Prior to admission he was still driving, and accessing the community, however he states that his community ambulation was limited.  He has had 1 fall in the past 6 months due to weakness from PNA.  During PT evaluation he was able to ambulate 118f with cane and supervision, but required 3 standing rest breaks due to SOB as well as back pain.  At this point, he is recommended to d/c home with HHPT.  He would likely benefit from pulmonary  rehab once he is done with HHPT.     Follow Up Recommendations Home health PT (after HHPT he will likely benefit from outpatient pulmonary rehab. )    Equipment Recommendations  None recommended by PT    Recommendations for Other Services       Precautions / Restrictions Precautions Precautions: Fall Precaution Comments: 1 fall due to having PNA and gave out.  Restrictions Weight Bearing Restrictions: No      Mobility  Bed Mobility Overal bed mobility: Modified Independent                Transfers Overall transfer level: Modified independent Equipment used: Straight cane                Ambulation/Gait Ambulation/Gait assistance: Supervision Ambulation Distance (Feet): 100 Feet Assistive device: Straight cane Gait Pattern/deviations: Step-through pattern;Antalgic;Trunk flexed   Gait velocity interpretation: <1.8 ft/sec, indicative of risk for recurrent falls General Gait Details: Pt is very antalgic with gait.  States he just had hip surgery back in August, and then they had to do another surgery.  Pt also required 3 standing rest breaks due to fatigue and back pain.  Pt also holds on to the wall rail at times during gait.   Pt's rate of perceived exertion on the modified BORG rating is an 8/10.  Stairs            WEmergency planning/management officer  Modified Rankin (Stroke Patients Only)       Balance Overall balance assessment: History of Falls Sitting-balance support: Bilateral upper extremity supported;Feet supported Sitting balance-Leahy Scale: Good     Standing balance support: Single extremity supported Standing balance-Leahy Scale: Fair                               Pertinent Vitals/Pain Pain Assessment: 0-10 Pain Score: 8  Pain Location: back and hips Pain Descriptors / Indicators: Sharp;Stabbing Pain Intervention(s): Limited activity within patient's tolerance;Monitored during session;Repositioned    Home Living   Living  Arrangements: Spouse/significant other Available Help at Discharge: Available 24 hours/day Type of Home: House Home Access: Stairs to enter   [employer] of Steps: 1+1 Home Layout: One level Home Equipment: Tub bench;Walker - 4 wheels;Bedside commode;Cane - single point      Prior Function     Gait / Transfers Assistance Needed: Pt states he occasionally uses the cane.  Pt states he was able to do some community ambulation.   ADL's / Homemaking Assistance Needed: independent with dressing and bathing, driving.          Hand Dominance   Dominant Hand: Right    Extremity/Trunk Assessment   Upper Extremity Assessment Upper Extremity Assessment: Overall WFL for tasks assessed    Lower Extremity Assessment Lower Extremity Assessment: Generalized weakness       Communication   Communication: No difficulties  Cognition Arousal/Alertness: Awake/alert Behavior During Therapy: WFL for tasks assessed/performed Overall Cognitive Status: Within Functional Limits for tasks assessed                      General Comments      Exercises     Assessment/Plan    PT Assessment All further PT needs can be met in the next venue of care  PT Problem List Decreased strength;Decreased activity tolerance;Decreased mobility;Cardiopulmonary status limiting activity;Pain          PT Treatment Interventions      PT Goals (Current goals can be found in the Care Plan section)  Acute Rehab PT Goals Patient Stated Goal: Pt wants to go home and do HHPT at home.  PT Goal Formulation: With patient Time For Goal Achievement: 04/10/16 Potential to Achieve Goals: Fair    Frequency     Barriers to discharge        Co-evaluation               End of Session Equipment Utilized During Treatment: Gait belt;Oxygen Activity Tolerance: Patient tolerated treatment well;Patient limited by fatigue Patient left: in chair;with call bell/phone within reach Nurse  Communication: Mobility status (mobiltiy sheet left hanging up in the room. )    Functional Assessment Tool Used: [employer]	[place] AM-PAC "6-clicks"  Functional Limitation: Mobility: Walking and moving around Mobility: Walking and Moving Around Current Status [phone]): At least 20 percent but less than 40 percent impaired, limited or restricted Mobility: Walking and Moving Around Goal Status [phone]): At least 20 percent but less than 40 percent impaired, limited or restricted Mobility: Walking and Moving Around Discharge Status [phone]): At least 20 percent but less than 40 percent impaired, limited or restricted    Time: 0927-0953 PT Time Calculation (min) (ACUTE ONLY): 26 min   Charges:   PT Evaluation $PT Eval Low Complexity: 1 Procedure PT Treatments $Gait Training: 8-22 mins   PT G Codes:   PT G-Codes **NOT FOR  INPATIENT CLASS** Functional Assessment Tool Used: [employer] "6-clicks"  Functional Limitation: Mobility: Walking and moving around Mobility: Walking and Moving Around Current Status [phone]): At least 20 percent but less than 40 percent impaired, limited or restricted Mobility: Walking and Moving Around Goal Status [phone]): At least 20 percent but less than 40 percent impaired, limited or restricted Mobility: Walking and Moving Around Discharge Status [phone]): At least 20 percent but less than 40 percent impaired, limited or restricted    Beth Delyla Sandeen, PT, DPT X: [phone]

## 2016-04-04 LAB — CULTURE, BLOOD (ROUTINE X 2)
CULTURE: NO GROWTH
Culture: NO GROWTH

## 2016-04-05 DIAGNOSIS — J440 Chronic obstructive pulmonary disease with acute lower respiratory infection: Secondary | ICD-10-CM | POA: Diagnosis not present

## 2016-04-05 DIAGNOSIS — J441 Chronic obstructive pulmonary disease with (acute) exacerbation: Secondary | ICD-10-CM | POA: Diagnosis not present

## 2016-04-05 DIAGNOSIS — A4189 Other specified sepsis: Secondary | ICD-10-CM | POA: Diagnosis not present

## 2016-04-05 DIAGNOSIS — Z86718 Personal history of other venous thrombosis and embolism: Secondary | ICD-10-CM | POA: Diagnosis not present

## 2016-04-05 DIAGNOSIS — G8929 Other chronic pain: Secondary | ICD-10-CM | POA: Diagnosis not present

## 2016-04-05 DIAGNOSIS — Z7982 Long term (current) use of aspirin: Secondary | ICD-10-CM | POA: Diagnosis not present

## 2016-04-05 DIAGNOSIS — Z7951 Long term (current) use of inhaled steroids: Secondary | ICD-10-CM | POA: Diagnosis not present

## 2016-04-05 DIAGNOSIS — J168 Pneumonia due to other specified infectious organisms: Secondary | ICD-10-CM | POA: Diagnosis not present

## 2016-04-05 DIAGNOSIS — J189 Pneumonia, unspecified organism: Secondary | ICD-10-CM | POA: Diagnosis not present

## 2016-04-05 DIAGNOSIS — A419 Sepsis, unspecified organism: Secondary | ICD-10-CM | POA: Diagnosis not present

## 2016-04-05 DIAGNOSIS — R69 Illness, unspecified: Secondary | ICD-10-CM | POA: Diagnosis not present

## 2016-04-05 DIAGNOSIS — J9621 Acute and chronic respiratory failure with hypoxia: Secondary | ICD-10-CM | POA: Diagnosis not present

## 2016-04-05 NOTE — Progress Notes (Signed)
Discharge instructions given, verbalized understanding, out in stable condition via w/c with staff. 

## 2016-04-10 DIAGNOSIS — J168 Pneumonia due to other specified infectious organisms: Secondary | ICD-10-CM | POA: Diagnosis not present

## 2016-04-10 DIAGNOSIS — Z6823 Body mass index (BMI) 23.0-23.9, adult: Secondary | ICD-10-CM | POA: Diagnosis not present

## 2016-04-10 DIAGNOSIS — J440 Chronic obstructive pulmonary disease with acute lower respiratory infection: Secondary | ICD-10-CM | POA: Diagnosis not present

## 2016-04-10 DIAGNOSIS — R69 Illness, unspecified: Secondary | ICD-10-CM | POA: Diagnosis not present

## 2016-04-12 DIAGNOSIS — Z7982 Long term (current) use of aspirin: Secondary | ICD-10-CM | POA: Diagnosis not present

## 2016-04-12 DIAGNOSIS — J189 Pneumonia, unspecified organism: Secondary | ICD-10-CM | POA: Diagnosis not present

## 2016-04-12 DIAGNOSIS — J9621 Acute and chronic respiratory failure with hypoxia: Secondary | ICD-10-CM | POA: Diagnosis not present

## 2016-04-12 DIAGNOSIS — R69 Illness, unspecified: Secondary | ICD-10-CM | POA: Diagnosis not present

## 2016-04-12 DIAGNOSIS — Z7951 Long term (current) use of inhaled steroids: Secondary | ICD-10-CM | POA: Diagnosis not present

## 2016-04-12 DIAGNOSIS — J441 Chronic obstructive pulmonary disease with (acute) exacerbation: Secondary | ICD-10-CM | POA: Diagnosis not present

## 2016-04-12 DIAGNOSIS — G8929 Other chronic pain: Secondary | ICD-10-CM | POA: Diagnosis not present

## 2016-04-12 DIAGNOSIS — A419 Sepsis, unspecified organism: Secondary | ICD-10-CM | POA: Diagnosis not present

## 2016-04-12 DIAGNOSIS — Z86718 Personal history of other venous thrombosis and embolism: Secondary | ICD-10-CM | POA: Diagnosis not present

## 2016-04-12 DIAGNOSIS — J440 Chronic obstructive pulmonary disease with acute lower respiratory infection: Secondary | ICD-10-CM | POA: Diagnosis not present

## 2016-04-17 DIAGNOSIS — R69 Illness, unspecified: Secondary | ICD-10-CM | POA: Diagnosis not present

## 2016-04-17 DIAGNOSIS — Z7982 Long term (current) use of aspirin: Secondary | ICD-10-CM | POA: Diagnosis not present

## 2016-04-17 DIAGNOSIS — J441 Chronic obstructive pulmonary disease with (acute) exacerbation: Secondary | ICD-10-CM | POA: Diagnosis not present

## 2016-04-17 DIAGNOSIS — J440 Chronic obstructive pulmonary disease with acute lower respiratory infection: Secondary | ICD-10-CM | POA: Diagnosis not present

## 2016-04-17 DIAGNOSIS — Z7951 Long term (current) use of inhaled steroids: Secondary | ICD-10-CM | POA: Diagnosis not present

## 2016-04-17 DIAGNOSIS — J9621 Acute and chronic respiratory failure with hypoxia: Secondary | ICD-10-CM | POA: Diagnosis not present

## 2016-04-17 DIAGNOSIS — J189 Pneumonia, unspecified organism: Secondary | ICD-10-CM | POA: Diagnosis not present

## 2016-04-17 DIAGNOSIS — G8929 Other chronic pain: Secondary | ICD-10-CM | POA: Diagnosis not present

## 2016-04-17 DIAGNOSIS — Z86718 Personal history of other venous thrombosis and embolism: Secondary | ICD-10-CM | POA: Diagnosis not present

## 2016-04-17 DIAGNOSIS — A419 Sepsis, unspecified organism: Secondary | ICD-10-CM | POA: Diagnosis not present

## 2016-04-21 DIAGNOSIS — J441 Chronic obstructive pulmonary disease with (acute) exacerbation: Secondary | ICD-10-CM | POA: Diagnosis not present

## 2016-04-21 DIAGNOSIS — J189 Pneumonia, unspecified organism: Secondary | ICD-10-CM | POA: Diagnosis not present

## 2016-04-21 DIAGNOSIS — G8929 Other chronic pain: Secondary | ICD-10-CM | POA: Diagnosis not present

## 2016-04-21 DIAGNOSIS — J440 Chronic obstructive pulmonary disease with acute lower respiratory infection: Secondary | ICD-10-CM | POA: Diagnosis not present

## 2016-04-21 DIAGNOSIS — J9621 Acute and chronic respiratory failure with hypoxia: Secondary | ICD-10-CM | POA: Diagnosis not present

## 2016-04-21 DIAGNOSIS — Z7982 Long term (current) use of aspirin: Secondary | ICD-10-CM | POA: Diagnosis not present

## 2016-04-21 DIAGNOSIS — R69 Illness, unspecified: Secondary | ICD-10-CM | POA: Diagnosis not present

## 2016-04-21 DIAGNOSIS — Z7951 Long term (current) use of inhaled steroids: Secondary | ICD-10-CM | POA: Diagnosis not present

## 2016-04-21 DIAGNOSIS — Z86718 Personal history of other venous thrombosis and embolism: Secondary | ICD-10-CM | POA: Diagnosis not present

## 2016-04-21 DIAGNOSIS — A419 Sepsis, unspecified organism: Secondary | ICD-10-CM | POA: Diagnosis not present

## 2016-05-08 DIAGNOSIS — Z79899 Other long term (current) drug therapy: Secondary | ICD-10-CM | POA: Diagnosis not present

## 2016-05-08 DIAGNOSIS — M25561 Pain in right knee: Secondary | ICD-10-CM | POA: Diagnosis not present

## 2016-05-08 DIAGNOSIS — Z5181 Encounter for therapeutic drug level monitoring: Secondary | ICD-10-CM | POA: Diagnosis not present

## 2016-05-08 DIAGNOSIS — G8929 Other chronic pain: Secondary | ICD-10-CM | POA: Diagnosis not present

## 2016-05-08 DIAGNOSIS — Z79891 Long term (current) use of opiate analgesic: Secondary | ICD-10-CM | POA: Diagnosis not present

## 2016-05-08 DIAGNOSIS — G8922 Chronic post-thoracotomy pain: Secondary | ICD-10-CM | POA: Diagnosis not present

## 2016-05-08 DIAGNOSIS — M25551 Pain in right hip: Secondary | ICD-10-CM | POA: Diagnosis not present

## 2016-07-03 DIAGNOSIS — Z5181 Encounter for therapeutic drug level monitoring: Secondary | ICD-10-CM | POA: Diagnosis not present

## 2016-07-03 DIAGNOSIS — G8929 Other chronic pain: Secondary | ICD-10-CM | POA: Diagnosis not present

## 2016-07-03 DIAGNOSIS — G8922 Chronic post-thoracotomy pain: Secondary | ICD-10-CM | POA: Diagnosis not present

## 2016-07-03 DIAGNOSIS — Z79891 Long term (current) use of opiate analgesic: Secondary | ICD-10-CM | POA: Diagnosis not present

## 2016-07-03 DIAGNOSIS — Z79899 Other long term (current) drug therapy: Secondary | ICD-10-CM | POA: Diagnosis not present

## 2016-07-03 DIAGNOSIS — M25561 Pain in right knee: Secondary | ICD-10-CM | POA: Diagnosis not present

## 2016-07-03 DIAGNOSIS — M25551 Pain in right hip: Secondary | ICD-10-CM | POA: Diagnosis not present

## 2016-07-07 DIAGNOSIS — Z6823 Body mass index (BMI) 23.0-23.9, adult: Secondary | ICD-10-CM | POA: Diagnosis not present

## 2016-07-07 DIAGNOSIS — J440 Chronic obstructive pulmonary disease with acute lower respiratory infection: Secondary | ICD-10-CM | POA: Diagnosis not present

## 2016-07-18 DIAGNOSIS — R55 Syncope and collapse: Secondary | ICD-10-CM | POA: Diagnosis not present

## 2016-07-18 DIAGNOSIS — R931 Abnormal findings on diagnostic imaging of heart and coronary circulation: Secondary | ICD-10-CM | POA: Diagnosis not present

## 2016-07-19 ENCOUNTER — Observation Stay (HOSPITAL_COMMUNITY)
Admission: EM | Admit: 2016-07-19 | Discharge: 2016-07-20 | Disposition: A | Discharge status: Home / Self Care | Payer: Medicare HMO | Attending: Cardiology | Admitting: Cardiology

## 2016-07-19 ENCOUNTER — Encounter (HOSPITAL_COMMUNITY): Payer: Self-pay | Admitting: Emergency Medicine

## 2016-07-19 ENCOUNTER — Emergency Department (HOSPITAL_COMMUNITY): Payer: Medicare HMO

## 2016-07-19 DIAGNOSIS — R9439 Abnormal result of other cardiovascular function study: Secondary | ICD-10-CM | POA: Diagnosis not present

## 2016-07-19 DIAGNOSIS — J9610 Chronic respiratory failure, unspecified whether with hypoxia or hypercapnia: Secondary | ICD-10-CM | POA: Diagnosis not present

## 2016-07-19 DIAGNOSIS — G894 Chronic pain syndrome: Secondary | ICD-10-CM | POA: Diagnosis not present

## 2016-07-19 DIAGNOSIS — R079 Chest pain, unspecified: Secondary | ICD-10-CM | POA: Diagnosis not present

## 2016-07-19 DIAGNOSIS — Z7982 Long term (current) use of aspirin: Secondary | ICD-10-CM | POA: Insufficient documentation

## 2016-07-19 DIAGNOSIS — J449 Chronic obstructive pulmonary disease, unspecified: Secondary | ICD-10-CM | POA: Diagnosis not present

## 2016-07-19 DIAGNOSIS — M549 Dorsalgia, unspecified: Secondary | ICD-10-CM | POA: Diagnosis not present

## 2016-07-19 DIAGNOSIS — Z86718 Personal history of other venous thrombosis and embolism: Secondary | ICD-10-CM | POA: Diagnosis not present

## 2016-07-19 DIAGNOSIS — F1120 Opioid dependence, uncomplicated: Secondary | ICD-10-CM | POA: Diagnosis not present

## 2016-07-19 DIAGNOSIS — R69 Illness, unspecified: Secondary | ICD-10-CM | POA: Diagnosis not present

## 2016-07-19 DIAGNOSIS — J9611 Chronic respiratory failure with hypoxia: Secondary | ICD-10-CM | POA: Diagnosis not present

## 2016-07-19 DIAGNOSIS — Z720 Tobacco use: Secondary | ICD-10-CM | POA: Diagnosis not present

## 2016-07-19 DIAGNOSIS — Z87891 Personal history of nicotine dependence: Secondary | ICD-10-CM | POA: Diagnosis not present

## 2016-07-19 DIAGNOSIS — R0789 Other chest pain: Secondary | ICD-10-CM | POA: Diagnosis not present

## 2016-07-19 DIAGNOSIS — I9589 Other hypotension: Secondary | ICD-10-CM | POA: Diagnosis not present

## 2016-07-19 DIAGNOSIS — G629 Polyneuropathy, unspecified: Secondary | ICD-10-CM | POA: Insufficient documentation

## 2016-07-19 DIAGNOSIS — Z7952 Long term (current) use of systemic steroids: Secondary | ICD-10-CM | POA: Insufficient documentation

## 2016-07-19 DIAGNOSIS — M810 Age-related osteoporosis without current pathological fracture: Secondary | ICD-10-CM | POA: Diagnosis not present

## 2016-07-19 DIAGNOSIS — I200 Unstable angina: Secondary | ICD-10-CM

## 2016-07-19 DIAGNOSIS — Z9981 Dependence on supplemental oxygen: Secondary | ICD-10-CM | POA: Diagnosis not present

## 2016-07-19 DIAGNOSIS — Z7951 Long term (current) use of inhaled steroids: Secondary | ICD-10-CM | POA: Diagnosis not present

## 2016-07-19 DIAGNOSIS — I249 Acute ischemic heart disease, unspecified: Secondary | ICD-10-CM | POA: Diagnosis present

## 2016-07-19 HISTORY — DX: Dependence on supplemental oxygen: Z99.81

## 2016-07-19 LAB — PROTIME-INR
INR: 0.93
PROTHROMBIN TIME: 12.4 s (ref 11.4–15.2)

## 2016-07-19 LAB — HEPATIC FUNCTION PANEL
ALT: 14 U/L — ABNORMAL LOW (ref 17–63)
AST: 17 U/L (ref 15–41)
Albumin: 3.5 g/dL (ref 3.5–5.0)
Alkaline Phosphatase: 84 U/L (ref 38–126)
TOTAL PROTEIN: 6.5 g/dL (ref 6.5–8.1)
Total Bilirubin: 0.3 mg/dL (ref 0.3–1.2)

## 2016-07-19 LAB — CBC
HEMATOCRIT: 42.7 % (ref 39.0–52.0)
Hemoglobin: 14.5 g/dL (ref 13.0–17.0)
MCH: 31 pg (ref 26.0–34.0)
MCHC: 34 g/dL (ref 30.0–36.0)
MCV: 91.2 fL (ref 78.0–100.0)
PLATELETS: 307 10*3/uL (ref 150–400)
RBC: 4.68 MIL/uL (ref 4.22–5.81)
RDW: 12.4 % (ref 11.5–15.5)
WBC: 7.1 10*3/uL (ref 4.0–10.5)

## 2016-07-19 LAB — HEPARIN LEVEL (UNFRACTIONATED): Heparin Unfractionated: 0.25 IU/mL — ABNORMAL LOW (ref 0.30–0.70)

## 2016-07-19 LAB — BASIC METABOLIC PANEL
Anion gap: 7 (ref 5–15)
BUN: 8 mg/dL (ref 6–20)
CALCIUM: 8.9 mg/dL (ref 8.9–10.3)
CO2: 25 mmol/L (ref 22–32)
Chloride: 104 mmol/L (ref 101–111)
Creatinine, Ser: 0.89 mg/dL (ref 0.61–1.24)
GFR calc Af Amer: >60 mL/min (ref >60)
Glucose, Bld: 96 mg/dL (ref 65–99)
POTASSIUM: 3.4 mmol/L — AB (ref 3.5–5.1)
SODIUM: 136 mmol/L (ref 135–145)

## 2016-07-19 LAB — TROPONIN I

## 2016-07-19 LAB — TSH: TSH: 1.526 u[IU]/mL (ref 0.350–4.500)

## 2016-07-19 MED ORDER — ALBUTEROL SULFATE (2.5 MG/3ML) 0.083% IN NEBU: 2.5000 mg | INHALATION_SOLUTION | Freq: Four times a day (QID) | RESPIRATORY_TRACT | Status: DC | PRN

## 2016-07-19 MED ORDER — SODIUM CHLORIDE 0.9 % IV SOLN
INTRAVENOUS | Status: DC
  Administered 2016-07-19: 14:00:00 via INTRAVENOUS

## 2016-07-19 MED ORDER — TIOTROPIUM BROMIDE MONOHYDRATE 18 MCG IN CAPS
18.0000 ug | ORAL_CAPSULE | Freq: Every day | RESPIRATORY_TRACT | Status: DC
  Filled 2016-07-19: qty 5

## 2016-07-19 MED ORDER — SODIUM CHLORIDE 0.9% FLUSH: 3.0000 mL | INTRAVENOUS | Status: DC | PRN

## 2016-07-19 MED ORDER — HYPROMELLOSE (GONIOSCOPIC) 2.5 % OP SOLN
1.0000 [drp] | Freq: Every day | OPHTHALMIC | Status: DC | PRN
  Filled 2016-07-19: qty 15

## 2016-07-19 MED ORDER — SODIUM CHLORIDE 0.9 % WEIGHT BASED INFUSION
3.0000 mL/kg/h | INTRAVENOUS | Status: DC
  Administered 2016-07-20: 3 mL/kg/h via INTRAVENOUS

## 2016-07-19 MED ORDER — METHOCARBAMOL 500 MG PO TABS: 500.0000 mg | ORAL_TABLET | Freq: Four times a day (QID) | ORAL | Status: DC | PRN

## 2016-07-19 MED ORDER — ONDANSETRON HCL 4 MG/2ML IJ SOLN: 4.0000 mg | Freq: Four times a day (QID) | INTRAMUSCULAR | Status: DC | PRN

## 2016-07-19 MED ORDER — MORPHINE SULFATE (PF) 4 MG/ML IV SOLN
4.0000 mg | Freq: Once | INTRAVENOUS | Status: AC
  Administered 2016-07-19: 4 mg via INTRAVENOUS
  Filled 2016-07-19: qty 1

## 2016-07-19 MED ORDER — SODIUM CHLORIDE 0.9% FLUSH
3.0000 mL | Freq: Two times a day (BID) | INTRAVENOUS | Status: DC
  Administered 2016-07-19: 3 mL via INTRAVENOUS

## 2016-07-19 MED ORDER — NICOTINE 21 MG/24HR TD PT24
21.0000 mg | MEDICATED_PATCH | Freq: Every day | TRANSDERMAL | Status: DC
  Administered 2016-07-19: 21 mg via TRANSDERMAL
  Filled 2016-07-19: qty 1

## 2016-07-19 MED ORDER — ASPIRIN 81 MG PO CHEW: 81.0000 mg | CHEWABLE_TABLET | Freq: Every day | ORAL | Status: DC

## 2016-07-19 MED ORDER — NITROGLYCERIN 0.4 MG SL SUBL: 0.4000 mg | SUBLINGUAL_TABLET | SUBLINGUAL | Status: DC | PRN

## 2016-07-19 MED ORDER — ASPIRIN 81 MG PO CHEW
81.0000 mg | CHEWABLE_TABLET | ORAL | Status: AC
  Administered 2016-07-20: 81 mg via ORAL
  Filled 2016-07-19: qty 1

## 2016-07-19 MED ORDER — OXYCODONE HCL ER 10 MG PO T12A: 30.0000 mg | EXTENDED_RELEASE_TABLET | Freq: Three times a day (TID) | ORAL | Status: DC

## 2016-07-19 MED ORDER — SODIUM CHLORIDE 0.9 % IV SOLN: 250.0000 mL | INTRAVENOUS | Status: DC | PRN

## 2016-07-19 MED ORDER — POTASSIUM CHLORIDE CRYS ER 20 MEQ PO TBCR
40.0000 meq | EXTENDED_RELEASE_TABLET | Freq: Once | ORAL | Status: AC
  Administered 2016-07-19: 40 meq via ORAL
  Filled 2016-07-19: qty 2

## 2016-07-19 MED ORDER — SODIUM CHLORIDE 0.9 % WEIGHT BASED INFUSION: 1.0000 mL/kg/h | INTRAVENOUS | Status: DC

## 2016-07-19 MED ORDER — MOMETASONE FURO-FORMOTEROL FUM 200-5 MCG/ACT IN AERO
2.0000 | INHALATION_SPRAY | Freq: Two times a day (BID) | RESPIRATORY_TRACT | Status: DC
  Filled 2016-07-19: qty 8.8

## 2016-07-19 MED ORDER — ALBUTEROL SULFATE HFA 108 (90 BASE) MCG/ACT IN AERS: 2.0000 | INHALATION_SPRAY | RESPIRATORY_TRACT | Status: DC | PRN

## 2016-07-19 MED ORDER — ONDANSETRON 4 MG PO TBDP
4.0000 mg | ORAL_TABLET | Freq: Three times a day (TID) | ORAL | Status: DC | PRN
  Filled 2016-07-19: qty 1

## 2016-07-19 MED ORDER — GABAPENTIN 300 MG PO CAPS
600.0000 mg | ORAL_CAPSULE | Freq: Three times a day (TID) | ORAL | Status: DC
  Administered 2016-07-19 – 2016-07-20 (×2): 600 mg via ORAL
  Filled 2016-07-19 (×2): qty 2

## 2016-07-19 MED ORDER — ACETAMINOPHEN 325 MG PO TABS: 650.0000 mg | ORAL_TABLET | ORAL | Status: DC | PRN

## 2016-07-19 MED ORDER — SODIUM CHLORIDE 0.9 % IV BOLUS (SEPSIS)
500.0000 mL | Freq: Once | INTRAVENOUS | Status: AC
  Administered 2016-07-19: 500 mL via INTRAVENOUS

## 2016-07-19 MED ORDER — MONTELUKAST SODIUM 10 MG PO TABS
10.0000 mg | ORAL_TABLET | Freq: Every day | ORAL | Status: DC
  Administered 2016-07-19: 10 mg via ORAL
  Filled 2016-07-19: qty 1

## 2016-07-19 MED ORDER — MORPHINE SULFATE (PF) 2 MG/ML IV SOLN
2.0000 mg | INTRAVENOUS | Status: DC | PRN
  Administered 2016-07-20 (×2): 2 mg via INTRAVENOUS
  Filled 2016-07-19 (×4): qty 1

## 2016-07-19 MED ORDER — ASPIRIN 81 MG PO CHEW
324.0000 mg | CHEWABLE_TABLET | Freq: Once | ORAL | Status: AC
  Administered 2016-07-19: 324 mg via ORAL
  Filled 2016-07-19: qty 4

## 2016-07-19 MED ORDER — ONDANSETRON HCL 4 MG/2ML IJ SOLN
4.0000 mg | Freq: Once | INTRAMUSCULAR | Status: AC
  Administered 2016-07-19: 4 mg via INTRAVENOUS
  Filled 2016-07-19: qty 2

## 2016-07-19 MED ORDER — FENTANYL CITRATE (PF) 100 MCG/2ML IJ SOLN
100.0000 ug | Freq: Once | INTRAMUSCULAR | Status: AC
  Administered 2016-07-19: 100 ug via INTRAVENOUS
  Filled 2016-07-19: qty 2

## 2016-07-19 MED ORDER — HEPARIN (PORCINE) IN NACL 100-0.45 UNIT/ML-% IJ SOLN
1350.0000 [IU]/h | INTRAMUSCULAR | Status: DC
  Administered 2016-07-19: 1000 [IU]/h via INTRAVENOUS
  Filled 2016-07-19: qty 250

## 2016-07-19 MED ORDER — HEPARIN BOLUS VIA INFUSION
4000.0000 [IU] | Freq: Once | INTRAVENOUS | Status: AC
  Administered 2016-07-19: 4000 [IU] via INTRAVENOUS

## 2016-07-19 MED ORDER — OXYCODONE HCL ER 10 MG PO T12A
30.0000 mg | EXTENDED_RELEASE_TABLET | Freq: Once | ORAL | Status: AC
  Administered 2016-07-19: 30 mg via ORAL
  Filled 2016-07-19: qty 3

## 2016-07-19 NOTE — ED Notes (Signed)
[facility] called and paged Cardiology at this time. Awaiting callback.

## 2016-07-19 NOTE — ED Provider Notes (Signed)
[facility] DEPT Provider Note   CSN: [id number] Arrival date & time: 07/19/16  1231     History   Chief Complaint Chief Complaint  Patient presents with  . Chest Pain    HPI Wesley [name] is a 51 y.o. male.  Presents for evaluation of chest pain, which started yesterday evening and has been persistent.  He describes the pain as a "heavy feeling".  And is currently 8/10.  He also currently has back pain lower, and leg pain bilaterally.  These pains are typical of his chronic pain for which he takes high-dose narcotic analgesia every day.  Did not take any narcotics today because he was worried about the chest discomfort.  He was able to eat low bit but was worried if he ate a lot it would cause his pain to get worse.  He had a nuclear stress test done yesterday, at [facility].  He heard from the nurse of the physician order the test that he needed to go see a cardiologist regarding the results of the test.  He last took aspirin yesterday evening at 11 PM.  He has periods of weakness, and last week had an episode of syncope associated with it.  Morning he feels weak and lightheaded as well.  Prior to yesterday he was having chest pain 2 or 3 times a week, which occurred primarily with walking.  He is trying to cut back on tobacco products, and is currently using an e-cigarette.  He denies nausea, vomiting, dysuria or constipation.  There are no other known modifying factors.  HPI  Past Medical History:  Diagnosis Date  . Avascular necrosis of femur ([place])   . Back pain   . COPD (chronic obstructive pulmonary disease) ([place])   . Neuropathy   . Osteoporosis   . Pulmonary asbestosis ([place])   . 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 Acute coronary syndrome ([place]) 07/19/2016  . Sepsis ([place]) 03/30/2016  . HCAP (healthcare-associated pneumonia) 03/30/2016  .  Hypokalemia 01/08/2016  . History of DVT (deep vein thrombosis) 01/08/2016  . OA (osteoarthritis) of hip 09/24/2015  . Community acquired pneumonia 04/14/2015  . Vomiting and diarrhea   . Solitary pulmonary nodule 05/18/2014  . Nausea and vomiting 05/16/2014  . Bronchitis, acute, with bronchospasm 02/24/2014  . Osteoporosis, unspecified 08/06/2012  . Hip pain 08/06/2012  . Opiate abuse, continuous 01/05/2012  . Opiate dependence ([place]) 01/03/2012  . Chronic respiratory failure ([place]) 01/03/2012  . COPD exacerbation ([place]) 01/03/2012  . Pulmonary infection 01/03/2012  . Pneumonia 07/19/2011  . Fever 07/18/2011  . Myalgia 07/18/2011  . Leukocytosis 07/18/2011  . COPD (chronic obstructive pulmonary disease) ([place]) 07/18/2011  . Acute-on-chronic respiratory failure ([place]) 07/18/2011  . Tachycardia 07/18/2011  . Dehydration 07/18/2011  . Chronic pain 07/18/2011  . Tobacco abuse 07/18/2011    Past Surgical History:  Procedure Laterality Date  . BACK SURGERY    . EYE SURGERY     left eye cornea and lens replaced- blind in left eye  . JOINT REPLACEMENT    . LUNG SURGERY    . TOTAL HIP ARTHROPLASTY Right 09/24/2015   Procedure: TOTAL HIP ARTHROPLASTY ANTERIOR APPROACH;  Surgeon: Gaynelle Arabian, MD;  Location: [facility];  Service: Orthopedics;  Laterality: Right;       Home Medications    Prior to  Admission medications   Medication Sig Start Date End Date Taking? Authorizing Provider  ADVAIR DISKUS 250-50 MCG/DOSE AEPB Inhale 1 puff into the lungs 2 (two) times daily. 06/14/15  Yes [provider]  albuterol (PROVENTIL HFA;VENTOLIN HFA) 108 (90 Base) MCG/ACT inhaler Inhale 2 puffs into the lungs every 4 (four) hours as needed for wheezing or shortness of breath. Shortness of breath 01/11/16  Yes Isaac Bliss, Rayford Halsted, MD  albuterol (PROVENTIL) (2.5 MG/3ML) 0.083% nebulizer solution Take 3 mLs (2.5 mg total) by nebulization every 6 (six) hours as needed for wheezing or shortness  of breath. Patient taking differently: Take 2.5 mg by nebulization every 4 (four) hours as needed for wheezing or shortness of breath.  02/27/14  Yes Kathie Dike, MD  aspirin 325 MG tablet Take 325 mg by mouth daily.   Yes [provider]  budesonide-formoterol (SYMBICORT) 160-4.5 MCG/ACT inhaler Inhale 3 puffs into the lungs 4 (four) times daily.   Yes [provider]  montelukast (SINGULAIR) 10 MG tablet Take 1 tablet by mouth daily. 07/07/16  Yes [provider]  naloxone HCl (NARCAN) 4 MG/0.1ML LIQD Place 4 mg into the nose as directed. To prevent overdose   Yes [provider]  nicotine (NICODERM CQ - DOSED IN MG/24 HOURS) 21 mg/24hr patch Place 21 mg onto the skin daily.   Yes [provider]  ondansetron (ZOFRAN ODT) 4 MG disintegrating tablet Take 1 tablet (4 mg total) by mouth every 8 (eight) hours as needed for nausea or vomiting. 05/24/14  Yes Samuella Cota, MD  oxyCODONE (OXYCONTIN) 30 MG 12 hr tablet Take 30 mg by mouth every 8 (eight) hours.   Yes [provider]  oxycodone (ROXICODONE) 30 MG immediate release tablet Take 30 mg by mouth every 4 (four) hours.   Yes [provider]  OXYGEN Inhale 3 L into the lungs daily as needed.   Yes [provider]  Polyethyl Glycol-Propyl Glycol (SYSTANE) 0.4-0.3 % SOLN Place 1-2 drops into the left eye daily as needed (for dry eye relief).    Yes [provider]  furosemide (LASIX) 40 MG tablet Take 40 mg by mouth daily.    [provider]  gabapentin (NEURONTIN) 300 MG capsule Take 600 mg by mouth 3 (three) times daily.     [provider]  levofloxacin (LEVAQUIN) 750 MG tablet Take 1 tablet (750 mg total) by mouth daily. Patient not taking: Reported on 07/19/2016 04/03/16   Thurnell Lose, MD  methocarbamol (ROBAXIN) 500 MG tablet Take 1 tablet (500 mg total) by mouth every 6 (six) hours as needed for muscle spasms. 09/24/15   Gaynelle Arabian,  MD  Oxycodone HCl 20 MG TABS Take 1 tablet (20 mg total) by mouth 3 (three) times daily. Patient not taking: Reported on 03/30/2016 10/15/15   Tanna Furry, MD  predniSONE (DELTASONE) 5 MG tablet Label  & dispense according to the schedule below. 10 Pills PO for 3 days then, 8 Pills PO for 3 days, 6 Pills PO for 3 days, 4 Pills PO for 3 days, 2 Pills PO for 3 days, 1 Pills PO for 3 days, 1/2 Pill  PO for 3 days then STOP. Total 95 pills. Patient not taking: Reported on 07/19/2016 04/03/16   Thurnell Lose, MD  tiotropium (SPIRIVA) 18 MCG inhalation capsule Place 1 capsule (18 mcg total) into inhaler and inhale daily. 07/24/11   Doree Albee, MD    Family History Family History  Problem  Relation Age of Onset  . Heart failure Father   . Diabetes Father   . Diabetes Mother   . Cancer Mother        male cancer    Social History Social History  Substance Use Topics  . Smoking status: Former Smoker    Packs/day: 1.00    Quit date: 07/05/2011  . Smokeless tobacco: Never Used  . Alcohol use No     Allergies   Ambien [zolpidem tartrate]; Melatonin; and Zolpidem   Review of Systems Review of Systems  All other systems reviewed and are negative.    Physical Exam Updated Vital Signs BP (!) 91/45   Pulse 85   Temp 98.1 F (36.7 C)   Resp 20   Ht 6\' 1"  (1.854 m)   Wt 184 lb (83.5 kg)   SpO2 97%   BMI 24.28 kg/m   Physical Exam  Constitutional: He is oriented to person, place, and time. He appears well-developed. He appears distressed (He is uncomfortable).  He appears older than stated age.  HENT:  Head: Normocephalic and atraumatic.  Right Ear: External ear normal.  Left Ear: External ear normal.  Eyes: Conjunctivae and EOM are normal. Pupils are equal, round, and reactive to light.  Neck: Normal range of motion and phonation normal. Neck supple.  Cardiovascular: Normal rate, regular rhythm and normal heart sounds.   Pulmonary/Chest: Effort normal and breath sounds  normal. He exhibits no tenderness and no bony tenderness.  Abdominal: Soft. There is no tenderness.  Musculoskeletal: Normal range of motion. He exhibits no edema or deformity.  Neurological: He is alert and oriented to person, place, and time. No cranial nerve deficit or sensory deficit. He exhibits normal muscle tone. Coordination normal.  Skin: Skin is warm, dry and intact.  Psychiatric: He has a normal mood and affect. His behavior is normal. Judgment and thought content normal.  Nursing note and vitals reviewed.    ED Treatments / Results  Labs (all labs ordered are listed, but only abnormal results are displayed) Labs Reviewed  BASIC METABOLIC PANEL - Abnormal; Notable for the following:       Result Value   Potassium 3.4 (*)    All other components within normal limits  CBC  TROPONIN I  PROTIME-INR  HEPARIN LEVEL (UNFRACTIONATED)  HEPARIN LEVEL (UNFRACTIONATED)  CBC    EKG  EKG Interpretation  Date/Time:  Wednesday Jul 19 2016 12:51:57 EDT Ventricular Rate:  79 PR Interval:  172 QRS Duration: 90 QT Interval:  370 QTC Calculation: 424 R Axis:   96 Text Interpretation:  Normal sinus rhythm Rightward axis Borderline ECG Since last tracing rate slower Confirmed by Daleen Bo [phone]) on 07/19/2016 1:19:39 PM         Date: 07/19/16- 16:04  Rate: 72  Rhythm: normal sinus rhythm  QRS Axis: normal  PR and QT Intervals: normal  ST/T Wave abnormalities: normal  PR and QRS Conduction Disutrbances:none  Narrative Interpretation:   Old EKG Reviewed: unchanged-from earlier today.   Radiology Dg Chest 2 View  Result Date: 07/19/2016 CLINICAL DATA:  51 year old male with chest pain radiating to the left arm. Abnormal nuclear medicine cardiac study yesterday. EXAM: CHEST  2 VIEW COMPARISON:  [facility] nuclear medicine cardiac study. Chest radiographs 04/02/2016 and earlier. FINDINGS: Chronic large lung volumes with emphysema. Resolved widespread right  greater than left reticulonodular pulmonary opacity since January. No pneumothorax, pulmonary edema, pleural effusion or acute pulmonary opacity. Mediastinal contours remain normal. Osteopenia. No acute  osseous abnormality identified. Negative visible bowel gas pattern. IMPRESSION: 1. Chronic lung disease including emphysema. No superimposed acute findings identified. 2. See also [facility] [place] / [facility] cardiac nuclear medicine study 07/18/2016. Electronically Signed   By: Genevie Ann M.D.   On: 07/19/2016 13:52    Procedures Procedures (including critical care time)  Medications Ordered in ED Medications  0.9 %  sodium chloride infusion ( Intravenous New Bag/Given 07/19/16 1422)  heparin bolus via infusion 4,000 Units (4,000 Units Intravenous Bolus from Bag 07/19/16 1441)    Followed by  heparin ADULT infusion 100 units/mL (25000 units/245mL sodium chloride 0.45%) (1,000 Units/hr Intravenous New Bag/Given 07/19/16 1441)  sodium chloride 0.9 % bolus 500 mL (0 mLs Intravenous Stopped 07/19/16 1531)  aspirin chewable tablet 324 mg (324 mg Oral Given 07/19/16 1413)  morphine 4 MG/ML injection 4 mg (4 mg Intravenous Given 07/19/16 1413)  ondansetron (ZOFRAN) injection 4 mg (4 mg Intravenous Given 07/19/16 1413)  morphine 4 MG/ML injection 4 mg (4 mg Intravenous Given 07/19/16 1613)     Initial Impression / Assessment and Plan / ED Course  I have reviewed the triage vital signs and the nursing notes.  Pertinent labs & imaging results that were available during my care of the patient were reviewed by me and considered in my medical decision making (see chart for details).  Clinical Course as of Jul 19 1813  Wed Jul 19, 2016  1415 Nuclear medicine stress test, done yesterday, reported on PACS system with following impression: #1 moderate size area of inducible reversibility involving the mid and distal inferior septum.  #2 mild to moderate diffuse basilar hypokinesis.  Mild-to-moderate hypokinesis within  the mid anterior wall and mid anterior septum.  #3 left ventricular ejection fraction 48%.  #4 noninvasive risk stratification-high.  Test was read by Dr. Clovis Riley.  [EW]  7564 Complaining of 9/10 chest pain, and was given a second dose of morphine.  He is still awaiting transfer.  EKG repeated and does not show ischemia or infarct.  [EW]    Clinical Course User Index [EW] Daleen Bo, MD    Medications  0.9 %  sodium chloride infusion ( Intravenous New Bag/Given 07/19/16 1422)  heparin bolus via infusion 4,000 Units (4,000 Units Intravenous Bolus from Bag 07/19/16 1441)    Followed by  heparin ADULT infusion 100 units/mL (25000 units/241mL sodium chloride 0.45%) (1,000 Units/hr Intravenous New Bag/Given 07/19/16 1441)  sodium chloride 0.9 % bolus 500 mL (0 mLs Intravenous Stopped 07/19/16 1531)  aspirin chewable tablet 324 mg (324 mg Oral Given 07/19/16 1413)  morphine 4 MG/ML injection 4 mg (4 mg Intravenous Given 07/19/16 1413)  ondansetron (ZOFRAN) injection 4 mg (4 mg Intravenous Given 07/19/16 1413)  morphine 4 MG/ML injection 4 mg (4 mg Intravenous Given 07/19/16 1613)    Patient Vitals for the past 24 hrs:  BP Temp Temp src Pulse Resp SpO2 Height Weight  07/19/16 1812 (!) 91/45 98.1 F (36.7 C) - 85 20 97 % - -  07/19/16 1800 (!) 91/45 - - 85 20 97 % - -  07/19/16 1700 (!) 97/55 - - 69 17 98 % - -  07/19/16 1615 - - - 75 11 98 % - -  07/19/16 1613 - - - 76 19 98 % - -  07/19/16 1610 (!) 104/49 - - 68 12 97 % - -  07/19/16 1606 - - - 70 13 99 % - -  07/19/16 1605 94/66 - - 73 10 99 % - -  07/19/16 1600 (!) 95/58 - - 65 14 98 % - -  07/19/16 1530 106/69 - - 65 15 98 % - -  07/19/16 1515 - - - 64 14 99 % - -  07/19/16 1500 105/60 - - 65 16 99 % - -  07/19/16 1445 - - - 68 17 100 % - -  07/19/16 1430 (!) 103/59 - - 70 16 100 % - -  07/19/16 1415 - - - 80 17 98 % - -  07/19/16 1400 - - - 75 12 99 % - -  07/19/16 1345 - - - 75 19 97 % - -  07/19/16 1330 105/66 - - 81 17 98 % - -    07/19/16 1244 - - - - - - 6\' 1"  (1.854 m) 184 lb (83.5 kg)  07/19/16 1243 (!) 109/59 98.1 F (36.7 C) Oral 88 19 97 % - -    14: 20-callback requested from cardiology, [facility], for transfer and care.  Dr. Ellyn Hack excepts patient to transfer to [facility] for admission to the stepdown unit- 14:40-[facility] will request bed.  3:50 PM Reevaluation with update and discussion. After initial assessment and treatment, an updated evaluation reveals no change in clinical status.  Patient and family members updated on findings, and plan. Adelynn Gipe L   CRITICAL CARE Performed by: Daleen Bo L Total critical care time: 50 minutes Critical care time was exclusive of separately billable procedures and treating other patients. Critical care was necessary to treat or prevent imminent or life-threatening deterioration. Critical care was time spent personally by me on the following activities: development of treatment plan with patient and/or surrogate as well as nursing, discussions with consultants, evaluation of patient's response to treatment, examination of patient, obtaining history from patient or surrogate, ordering and performing treatments and interventions, ordering and review of laboratory studies, ordering and review of radiographic studies, pulse oximetry and re-evaluation of patient's condition.    Final Clinical Impressions(s) / ED Diagnoses   Final diagnoses:  Unstable angina (HCC)   Chest pain with lightheadedness, and borderline low blood pressure.  [place] risk noninvasive cardiac stress test, was done yesterday.  Initial troponin normal.  Heparin and aspirin ordered.  Nitroglycerin held initially, for borderline blood pressure, pending the fluid bolus.  Consultation for wound care, and transfer.  Nursing Notes Reviewed/ Care Coordinated Applicable Imaging Reviewed Interpretation of Laboratory Data incorporated into ED treatment   Plan: Transfer to [facility] hospital, for  cardiac care    New Prescriptions New Prescriptions   No medications on file     Daleen Bo, MD 07/19/16 1816

## 2016-07-19 NOTE — ED Triage Notes (Signed)
Pt had nuclear test done yesterday on his heart at [facility]. Pt c/o cp radiating down left arm since. Nondiaphroetic. Vision blurred and "i have been getting disoriented' c/o no energy

## 2016-07-19 NOTE — Progress Notes (Signed)
ANTICOAGULATION CONSULT NOTE - Initial Consult  Pharmacy Consult for Heparin Indication: chest pain/ACS  Allergies  Allergen Reactions  . Ambien [Zolpidem Tartrate] Other (See Comments)    hallucinations  . Melatonin Itching  . Zolpidem Other (See Comments)    Altered mental status    Patient Measurements: Height: 6\' 1"  (185.4 cm) Weight: 184 lb (83.5 kg) IBW/kg (Calculated) : 79.9 HEPARIN DW (KG): 83.5  Vital Signs: Temp: 98.1 F (36.7 C) (05/16 1243) Temp Source: Oral (05/16 1243) BP: 105/66 (05/16 1330) Pulse Rate: 75 (05/16 1345)  Labs:  Recent Labs  07/19/16 1301  HGB 14.5  HCT 42.7  PLT 307  CREATININE 0.89  TROPONINI <0.03    Estimated Creatinine Clearance: 108.5 mL/min (by C-G formula based on SCr of 0.89 mg/dL).   Medical History: Past Medical History:  Diagnosis Date  . Avascular necrosis of femur ([place])   . Back pain   . COPD (chronic obstructive pulmonary disease) ([place])   . Neuropathy   . Osteoporosis   . Pulmonary asbestosis ([place])   . Tumor of lung    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, 2017    Medications:  See med rec  Assessment: 51 yo male presented to ED with Chest Pain radiating down left arm that started yesterday. A stress test was done at [facility] in [place] yesterday.(Results unavailable).   Start anticoagulation with Heparin.  Goal of Therapy:  Heparin level 0.3-0.7 units/ml Monitor platelets by anticoagulation protocol: Yes   Plan:  Give 4000 units bolus x 1 Start heparin infusion at 1000 units/hr Check anti-Xa level in 6-8  hours and daily while on heparin Continue to monitor H&H and platelets   Isac Sarna, BS Vena Austria, BCPS Clinical Pharmacist Pager [phone] 07/19/2016,2:29 PM

## 2016-07-19 NOTE — ED Notes (Signed)
Report given to Benjamine Mola, RN for [place] at this time. Awaiting [facility]'s arrival.

## 2016-07-19 NOTE — Progress Notes (Signed)
Pt states the he will leave AMA is his home pain medication are not reordered. MD notified.  Ferdinand Lango, RN

## 2016-07-19 NOTE — H&P (Signed)
Cardiology History & Physical    Patient ID: Wesley [name] MRN: [id number], DOB: 1965-05-11 Date of Encounter: 07/19/2016, 7:56 PM Primary Physician: [name], MD Primary Cardiologist: Dr. Bronson Ing (new)  Chief Complaint: chest pain Reason for Admission: chest pain, abnormal stress test Requesting MD: Dr. Eulis Foster  HPI: Wesley [name] is a 51 y.o. male with history of advanced COPD and bullous emphysema s/p prior lung surgery, chronic pain managed at [facility] on opiates (remote work injury falling over 100 feet), neuropathy, history of post-operative DVT (not on [facility]), chronically low BP here with chest pain transferred from [facility].    [name] recently saw his PCP for complaints of worsening SOB and weakness. He has been having chest discomfort a few times per week, occurring primarily with activity such as walking - but the shortness of breath has been more prominent. He underwent outpatient nuclear stress test at [facility] showing high risk with having a moderate sized area of inducible reversibility, EF 48%. The patient reports being told by nurse of ordering provider that he needed to see a cardiologist regarding the results.  Last night he developed chest heaviness on/off all night long associated with arm numbness, with sometimes brief bursts of sharp CP. Due to persistent sx he came to [facility] ER where EKG did not indicate acute ischemic changes. Labs notable for mild hypokalemia of 3.4, troponin I < 0.03, normal PT/INR.  He was given aspirin 324mg , morphine, Zofran, started on heparin infusion for unstable angina, IV fluids for borderline hypotension, and transferred to [facility] for further care. He is using nicotine patches as he recently quit smoking after 40 years. He is currently chest pain free but does note diffuse shoulder and knee pain c/w known arthralgias for which he takes chronic opiates. He states he did not take any yesterday or today because he was told they  may interfere with the test.  Past Medical History:  Diagnosis Date  . Avascular necrosis of femur ([place])   . Back pain   . COPD (chronic obstructive pulmonary disease) ([place])   . Neuropathy   . Osteoporosis   . Pulmonary asbestosis ([place])   . Tumor of lung    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, 2017     Surgical History:  Past Surgical History:  Procedure Laterality Date  . BACK SURGERY    . EYE SURGERY     left eye cornea and lens replaced- blind in left eye  . JOINT REPLACEMENT    . LUNG SURGERY    . TOTAL HIP ARTHROPLASTY Right 09/24/2015   Procedure: TOTAL HIP ARTHROPLASTY ANTERIOR APPROACH;  Surgeon: Gaynelle Arabian, MD;  Location: [facility] ORS;  Service: Orthopedics;  Laterality: Right;     Home Meds: Prior to Admission medications   Medication Sig Start Date End Date Taking? Authorizing Provider  ADVAIR DISKUS 250-50 MCG/DOSE AEPB Inhale 1 puff into the lungs 2 (two) times daily. 06/14/15  Yes [provider]  albuterol (PROVENTIL HFA;VENTOLIN HFA) 108 (90 Base) MCG/ACT inhaler Inhale 2 puffs into the lungs every 4 (four) hours as needed for wheezing or shortness of breath. Shortness of breath 01/11/16  Yes Isaac Bliss, Rayford Halsted, MD  albuterol (PROVENTIL) (2.5 MG/3ML) 0.083% nebulizer solution Take 3 mLs (2.5 mg total) by nebulization every 6 (six) hours as needed for wheezing or shortness of breath. Patient taking differently: Take 2.5 mg by nebulization every 4 (four) hours as needed for wheezing  or shortness of breath.  02/27/14  Yes Kathie Dike, MD  aspirin 325 MG tablet Take 325 mg by mouth daily.   Yes [provider]  budesonide-formoterol (SYMBICORT) 160-4.5 MCG/ACT inhaler Inhale 3 puffs into the lungs 4 (four) times daily.   Yes [provider]  montelukast (SINGULAIR) 10 MG tablet Take 1 tablet by mouth daily. 07/07/16  Yes [provider]  naloxone HCl (NARCAN) 4 MG/0.1ML LIQD Place 4 mg into the nose as directed.  To prevent overdose   Yes [provider]  nicotine (NICODERM CQ - DOSED IN MG/24 HOURS) 21 mg/24hr patch Place 21 mg onto the skin daily.   Yes [provider]  ondansetron (ZOFRAN ODT) 4 MG disintegrating tablet Take 1 tablet (4 mg total) by mouth every 8 (eight) hours as needed for nausea or vomiting. 05/24/14  Yes Samuella Cota, MD  oxyCODONE (OXYCONTIN) 30 MG 12 hr tablet Take 30 mg by mouth every 8 (eight) hours.   Yes [provider]  oxycodone (ROXICODONE) 30 MG immediate release tablet Take 30 mg by mouth every 4 (four) hours.   Yes [provider]  OXYGEN Inhale 3 L into the lungs daily as needed.   Yes [provider]  Polyethyl Glycol-Propyl Glycol (SYSTANE) 0.4-0.3 % SOLN Place 1-2 drops into the left eye daily as needed (for dry eye relief).    Yes [provider]  furosemide (LASIX) 40 MG tablet Take 40 mg by mouth daily.    [provider]  gabapentin (NEURONTIN) 300 MG capsule Take 600 mg by mouth 3 (three) times daily.     [provider]  levofloxacin (LEVAQUIN) 750 MG tablet Take 1 tablet (750 mg total) by mouth daily. Patient not taking: Reported on 07/19/2016 04/03/16   Thurnell Lose, MD  methocarbamol (ROBAXIN) 500 MG tablet Take 1 tablet (500 mg total) by mouth every 6 (six) hours as needed for muscle spasms. 09/24/15   Gaynelle Arabian, MD  Oxycodone HCl 20 MG TABS Take 1 tablet (20 mg total) by mouth 3 (three) times daily. Patient not taking: Reported on 03/30/2016 10/15/15   Tanna Furry, MD  predniSONE (DELTASONE) 5 MG tablet Label  & dispense according to the schedule below. 10 Pills PO for 3 days then, 8 Pills PO for 3 days, 6 Pills PO for 3 days, 4 Pills PO for 3 days, 2 Pills PO for 3 days, 1 Pills PO for 3 days, 1/2 Pill  PO for 3 days then STOP. Total 95 pills. Patient not taking: Reported on 07/19/2016 04/03/16   Thurnell Lose, MD  tiotropium (SPIRIVA) 18 MCG inhalation capsule Place 1 capsule  (18 mcg total) into inhaler and inhale daily. 07/24/11   Doree Albee, MD    Allergies:  Allergies  Allergen Reactions  . Ambien [Zolpidem Tartrate] Other (See Comments)    hallucinations  . Melatonin Itching  . Zolpidem Other (See Comments)    Altered mental status    Social History   Social History  . Marital status: Married    Spouse name: N/A  . Number of children: N/A  . Years of education: N/A   Occupational History  . Not on file.   Social History Main Topics  . Smoking status: Former Smoker    Packs/day: 1.00    Quit date: 07/05/2011  . Smokeless tobacco: Never Used  . Alcohol use No  . Drug use: No     Comment: takes these medications as prescribed  by Physician for pain!  . Sexual activity: Not Currently   Other Topics Concern  . Not on file   Social History Narrative  . No narrative on file     Family History  Problem Relation Age of Onset  . Heart failure Father   . Diabetes Father   . Diabetes Mother   . Cancer Mother        male cancer    Review of Systems: + chronic wheezing. All other systems reviewed and are otherwise negative except as noted above.  Labs:   Lab Results  Component Value Date   WBC 7.1 07/19/2016   HGB 14.5 07/19/2016   HCT 42.7 07/19/2016   MCV 91.2 07/19/2016   PLT 307 07/19/2016    Recent Labs Lab 07/19/16 1301  NA 136  K 3.4*  CL 104  CO2 25  BUN 8  CREATININE 0.89  CALCIUM 8.9  GLUCOSE 96    Recent Labs  07/19/16 1301  TROPONINI <0.03    Radiology/Studies:  Dg Chest 2 View  Result Date: 07/19/2016 CLINICAL DATA:  51 year old male with chest pain radiating to the left arm. Abnormal nuclear medicine cardiac study yesterday. EXAM: CHEST  2 VIEW COMPARISON:  [facility] nuclear medicine cardiac study. Chest radiographs 04/02/2016 and earlier. FINDINGS: Chronic large lung volumes with emphysema. Resolved widespread right greater than left reticulonodular pulmonary opacity since  January. No pneumothorax, pulmonary edema, pleural effusion or acute pulmonary opacity. Mediastinal contours remain normal. Osteopenia. No acute osseous abnormality identified. Negative visible bowel gas pattern. IMPRESSION: 1. Chronic lung disease including emphysema. No superimposed acute findings identified. 2. See also [facility] cardiac nuclear medicine study 07/18/2016. Electronically Signed   By: Genevie Ann M.D.   On: 07/19/2016 13:52   Wt Readings from Last 3 Encounters:  07/19/16 184 lb (83.5 kg)  03/31/16 181 lb 10.5 oz (82.4 kg)  01/08/16 181 lb (82.1 kg)   EKG: NSR 72bpm, no acute ST-T changes  Physical Exam: Blood pressure (!) 96/46, pulse 79, temperature 98.1 F (36.7 C), temperature source Oral, resp. rate 16, height 6\' 1"  (1.854 m), weight 184 lb (83.5 kg), SpO2 100 %. Body mass index is 24.28 kg/m. General: Mildly chronically ill appearing WM, in no acute distress. Head: Normocephalic, atraumatic, sclera non-icteric, no xanthomas, nares are without discharge.  Neck: Negative for carotid bruits. JVD not elevated. Lungs :Diffusely diminished with diffuse end exp wheezing. Breathing is unlabored. Heart: RRR with S1 S2. No murmurs, rubs, or gallops appreciated. Abdomen: Soft, non-tender, non-distended with normoactive bowel sounds. No hepatomegaly. No rebound/guarding. No obvious abdominal masses. Msk:  Strength and tone appear normal for age. Extremities: No clubbing or cyanosis. No edema.  Distal pedal pulses are 2+ and equal bilaterally. Neuro: Alert and oriented X 3. No focal deficit. No facial asymmetry. Moves all extremities spontaneously. Psych:  Responds to questions appropriately with a normal affect.    Assessment and Plan  62M with advanced COPD and bullous emphysema s/p prior lung surgery, chronic pain managed at [facility] on opiates (remote work injury falling over 100 feet), neuropathy, history of post-operative DVT (not on [facility]), chronically low BP here with  chest pain transferred from [facility].    1. Chest pain concerning for [place] with recent abnormal stress test 2. Advanced COPD with chronic respiratory failure on 3L home O2 3. Former tobacco use, now using patches 4. Chronic hypotension per patient 5. Chronic opiate use  See discussion below per review with  MD. Risks and benefits of cardiac catheterization have been discussed with the patient.  These include bleeding, infection, kidney damage, stroke, heart attack, death.  The patient understands these risks and is willing to proceed.   Signed, Charlie Pitter PA-C 07/19/2016, 7:56 PM Pager: [phone]  The patient was seen and examined, and I agree with the history, physical exam, assessment and plan as documented above, with modifications as noted below. I have also personally reviewed all relevant documentation, old records, labs, and both radiographic and cardiovascular studies. I have also independently interpreted old and new ECG's.  51 yr old male with aforementioned medical history (severe oxygen-dependent COPD, chronic pain, post-op DVT, chronic hypotension) who has been experiencing progressive shortness of breath and fatigue with exertional chest pain.  This prompted PCP to order a nuclear stress test which was deemed to be a high risk study, demonstrating a "moderate size are of inducible reversibility involving the mid and distal inferior septum with mild to moderate diffuse basilar hypokinesis and mild to moderate hypokinesis within the mid anterior wall and mid anterior septum, LVEF 48%".  He has already been started on IV heparin which we will continue along with ASA. Will plan for coronary angiography on 07/20/16. No beta blockers given severe COPD with wheezing. Renal function is normal.    Kate Sable, MD, [facility]  07/19/2016 8:30 PM

## 2016-07-19 NOTE — Progress Notes (Signed)
ANTICOAGULATION CONSULT NOTE - Initial Consult  Pharmacy Consult for Heparin Indication: chest pain/ACS  Allergies  Allergen Reactions  . Ambien [Zolpidem Tartrate] Other (See Comments)    hallucinations  . Melatonin Itching  . Zolpidem Other (See Comments)    Altered mental status    Patient Measurements: Height: 6\' 1"  (185.4 cm) Weight: 184 lb (83.5 kg) IBW/kg (Calculated) : 79.9 HEPARIN DW (KG): 83.5  Assessment: 51 yo male presented to ED with Chest Pain radiating down left arm that started yesterday. A stress test was done at [facility] in [place] yesterday.(Results unavailable).   Start anticoagulation with Heparin.  First heparin level slightly low at 0.25.  Goal of Therapy:  Heparin level 0.3-0.7 units/ml Monitor platelets by anticoagulation protocol: Yes   Plan:  Increase heparin gtt to 1,150 units/hr Check next heparin level with am labs Monitor daily heparin level, CBC, s/s of bleed F/U cath tomorrow  Elenor Quinones, PharmD, BCPS Clinical Pharmacist Pager [phone] 07/19/2016 10:03 PM

## 2016-07-19 NOTE — Progress Notes (Addendum)
Care Coordination Note Edited: Isaac Bliss, Rayford Halsted, MD 3/14/2016View Exhibits drug-seeking behavior. We need to manage his narcotic expectation right from arrival to the ED.     See above care coordination note entered in patient's chart in 2016.  Patient reports opiate doses are oxycontin 30mg  q8hr and oxycodone 30mg  q4hr (short acting). This is concordant to recent discharge summary in 03/2016.   He received quite a bit of pain medication at [facility] ER - 4mg  of morphine x 2 (1415, 1615) and 155mcg of fentanyl at 1830. Per review with Dr. Bronson Ing, given significant opiate administration earlier will hold off on resuming oral opiates and offer decreased morphine dosing of 2mg  qhr PRN severe pain, with hold parameters for sedation, hypotension and decreased respiratory rate. Have also written order for nursing staff to ask pharmacy to formally clarify the rx as written by actual pharmacy when able to, since the above were reported doses of oxyocodone.  Dayna Dunn PA-C

## 2016-07-20 ENCOUNTER — Encounter (HOSPITAL_COMMUNITY)
Admission: EM | Disposition: A | Discharge status: Home / Self Care | Payer: Self-pay | Source: Home / Self Care | Attending: Emergency Medicine

## 2016-07-20 ENCOUNTER — Encounter (HOSPITAL_COMMUNITY): Payer: Self-pay | Admitting: Cardiovascular Disease

## 2016-07-20 ENCOUNTER — Other Ambulatory Visit: Payer: Self-pay | Admitting: Cardiology

## 2016-07-20 DIAGNOSIS — R079 Chest pain, unspecified: Secondary | ICD-10-CM

## 2016-07-20 DIAGNOSIS — R0789 Other chest pain: Secondary | ICD-10-CM | POA: Diagnosis present

## 2016-07-20 DIAGNOSIS — R9439 Abnormal result of other cardiovascular function study: Secondary | ICD-10-CM | POA: Diagnosis not present

## 2016-07-20 HISTORY — PX: LEFT HEART CATH AND CORONARY ANGIOGRAPHY: CATH118249

## 2016-07-20 LAB — CBC
HCT: 42.3 % (ref 39.0–52.0)
HEMATOCRIT: 46.6 % (ref 39.0–52.0)
HEMOGLOBIN: 15.2 g/dL (ref 13.0–17.0)
Hemoglobin: 13.8 g/dL (ref 13.0–17.0)
MCH: 30.4 pg (ref 26.0–34.0)
MCH: 30.3 pg (ref 26.0–34.0)
MCHC: 32.6 g/dL (ref 30.0–36.0)
MCHC: 32.6 g/dL (ref 30.0–36.0)
MCV: 93.2 fL (ref 78.0–100.0)
MCV: 93 fL (ref 78.0–100.0)
PLATELETS: 288 10*3/uL (ref 150–400)
Platelets: 229 10*3/uL (ref 150–400)
RBC: 4.54 MIL/uL (ref 4.22–5.81)
RBC: 5.01 MIL/uL (ref 4.22–5.81)
RDW: 12.4 % (ref 11.5–15.5)
RDW: 12.4 % (ref 11.5–15.5)
WBC: 6.6 10*3/uL (ref 4.0–10.5)
WBC: 5.3 10*3/uL (ref 4.0–10.5)

## 2016-07-20 LAB — LIPID PANEL
Cholesterol: 173 mg/dL (ref 0–200)
HDL: 41 mg/dL (ref >40)
LDL CALC: 101 mg/dL — AB (ref 0–99)
TRIGLYCERIDES: 154 mg/dL — AB (ref <150)
Total CHOL/HDL Ratio: 4.2 RATIO
VLDL: 31 mg/dL (ref 0–40)

## 2016-07-20 LAB — BASIC METABOLIC PANEL WITH GFR
Anion gap: 7 (ref 5–15)
BUN: 6 mg/dL (ref 6–20)
CO2: 27 mmol/L (ref 22–32)
Calcium: 8.8 mg/dL — ABNORMAL LOW (ref 8.9–10.3)
Chloride: 103 mmol/L (ref 101–111)
Creatinine, Ser: 0.93 mg/dL (ref 0.61–1.24)
GFR calc Af Amer: >60 mL/min
GFR calc non Af Amer: >60 mL/min
Glucose, Bld: 93 mg/dL (ref 65–99)
Potassium: 4.5 mmol/L (ref 3.5–5.1)
Sodium: 137 mmol/L (ref 135–145)

## 2016-07-20 LAB — TROPONIN I
Troponin I: <0.03 ng/mL (ref <0.03)
Troponin I: <0.03 ng/mL (ref <0.03)

## 2016-07-20 LAB — HEPARIN LEVEL (UNFRACTIONATED): Heparin Unfractionated: 0.24 IU/mL — ABNORMAL LOW (ref 0.30–0.70)

## 2016-07-20 SURGERY — LEFT HEART CATH AND CORONARY ANGIOGRAPHY
Anesthesia: LOCAL

## 2016-07-20 MED ORDER — ASPIRIN 81 MG PO CHEW: 81.0000 mg | CHEWABLE_TABLET | Freq: Every day | ORAL | Status: DC

## 2016-07-20 MED ORDER — IOPAMIDOL (ISOVUE-370) INJECTION 76%
INTRAVENOUS | Status: DC | PRN
  Administered 2016-07-20: 60 mL via INTRA_ARTERIAL

## 2016-07-20 MED ORDER — SODIUM CHLORIDE 0.9% FLUSH
3.0000 mL | INTRAVENOUS | Status: DC | PRN
Start: 2016-07-20 — End: 2016-07-20

## 2016-07-20 MED ORDER — HEPARIN (PORCINE) IN NACL 2-0.9 UNIT/ML-% IJ SOLN
INTRAMUSCULAR | Status: AC
  Filled 2016-07-20: qty 1000

## 2016-07-20 MED ORDER — SODIUM CHLORIDE 0.9 % IV SOLN: INTRAVENOUS | Status: DC

## 2016-07-20 MED ORDER — LIDOCAINE HCL (PF) 1 % IJ SOLN
INTRAMUSCULAR | Status: DC | PRN
  Administered 2016-07-20: 20 mL

## 2016-07-20 MED ORDER — FENTANYL CITRATE (PF) 100 MCG/2ML IJ SOLN
INTRAMUSCULAR | Status: DC | PRN
  Administered 2016-07-20: 25 ug via INTRAVENOUS

## 2016-07-20 MED ORDER — IOPAMIDOL (ISOVUE-370) INJECTION 76%
INTRAVENOUS | Status: AC
  Filled 2016-07-20: qty 100

## 2016-07-20 MED ORDER — SODIUM CHLORIDE 0.9% FLUSH: 3.0000 mL | Freq: Two times a day (BID) | INTRAVENOUS | Status: DC

## 2016-07-20 MED ORDER — ACETAMINOPHEN 325 MG PO TABS: 650.0000 mg | ORAL_TABLET | ORAL | Status: DC | PRN

## 2016-07-20 MED ORDER — OXYCODONE HCL 5 MG PO TABS
30.0000 mg | ORAL_TABLET | ORAL | Status: DC | PRN
  Administered 2016-07-20 (×2): 30 mg via ORAL
  Filled 2016-07-20 (×2): qty 6

## 2016-07-20 MED ORDER — MIDAZOLAM HCL 2 MG/2ML IJ SOLN
INTRAMUSCULAR | Status: DC | PRN
  Administered 2016-07-20: 1 mg via INTRAVENOUS

## 2016-07-20 MED ORDER — ONDANSETRON HCL 4 MG/2ML IJ SOLN: 4.0000 mg | Freq: Four times a day (QID) | INTRAMUSCULAR | Status: DC | PRN

## 2016-07-20 MED ORDER — OXYCODONE HCL ER 10 MG PO T12A
30.0000 mg | EXTENDED_RELEASE_TABLET | Freq: Three times a day (TID) | ORAL | Status: DC
  Administered 2016-07-20: 30 mg via ORAL
  Filled 2016-07-20: qty 3

## 2016-07-20 MED ORDER — SODIUM CHLORIDE 0.9 % IV SOLN: 250.0000 mL | INTRAVENOUS | Status: DC | PRN

## 2016-07-20 MED ORDER — HEPARIN (PORCINE) IN NACL 2-0.9 UNIT/ML-% IJ SOLN
INTRAMUSCULAR | Status: AC | PRN
  Administered 2016-07-20: 1000 mL

## 2016-07-20 MED ORDER — MIDAZOLAM HCL 2 MG/2ML IJ SOLN
INTRAMUSCULAR | Status: AC
  Filled 2016-07-20: qty 2

## 2016-07-20 MED ORDER — MORPHINE SULFATE (PF) 2 MG/ML IV SOLN
2.0000 mg | INTRAVENOUS | Status: DC | PRN
  Administered 2016-07-20: 2 mg via INTRAVENOUS

## 2016-07-20 MED ORDER — FENTANYL CITRATE (PF) 100 MCG/2ML IJ SOLN
INTRAMUSCULAR | Status: AC
  Filled 2016-07-20: qty 2

## 2016-07-20 MED ORDER — ASPIRIN 81 MG PO CHEW
81.0000 mg | CHEWABLE_TABLET | Freq: Every day | ORAL | Status: DC
Start: 2016-07-20 — End: 2016-07-20

## 2016-07-20 MED ORDER — LIDOCAINE HCL (PF) 1 % IJ SOLN
INTRAMUSCULAR | Status: AC
  Filled 2016-07-20: qty 30

## 2016-07-20 SURGICAL SUPPLY — 9 items
CATH INFINITI 5FR MULTPACK ANG (CATHETERS) ×1 IMPLANT
DEVICE CLOSURE MYNXGRIP 5F (Vascular Products) ×1 IMPLANT
GUIDEWIRE 3MM J TIP .035 145 (WIRE) ×1 IMPLANT
KIT HEART LEFT (KITS) ×2 IMPLANT
PACK CARDIAC CATHETERIZATION (CUSTOM PROCEDURE TRAY) ×2 IMPLANT
SHEATH PINNACLE 5F 10CM (SHEATH) ×1 IMPLANT
SYR MEDRAD MARK V 150ML (SYRINGE) ×2 IMPLANT
TRANSDUCER W/STOPCOCK (MISCELLANEOUS) ×2 IMPLANT
TUBING CIL FLEX 10 FLL-RA (TUBING) ×2 IMPLANT

## 2016-07-20 NOTE — Care Management Obs Status (Signed)
[facility] NOTIFICATION   Patient Details  Name: Wesley [name] MRN: [id number] Date of Birth: January 12, 1966   Medicare Observation Status Notification Given:  Yes    Bethena Roys, RN 07/20/2016, 12:03 PM

## 2016-07-20 NOTE — Progress Notes (Signed)
Pt ambulated down hall without any abnormalities to r groin, no bleeding. Pt amb without distress. Wife at bedside. Carroll Kinds RN

## 2016-07-20 NOTE — Interval H&P Note (Signed)
Cath Lab Visit (complete for each Cath Lab visit)  Clinical Evaluation Leading to the Procedure:   ACS: No.  Non-ACS:    Anginal Classification: CCS III  Anti-ischemic medical therapy: No Therapy  Non-Invasive Test Results: High-risk stress test findings: cardiac mortality >3%/year  Prior CABG: No previous CABG      History and Physical Interval Note:  07/20/2016 9:18 AM  Wesley [name]  has presented today for surgery, with the diagnosis of unstable angina  The various methods of treatment have been discussed with the patient and family. After consideration of risks, benefits and other options for treatment, the patient has consented to  Procedure(s): Left Heart Cath and Coronary Angiography (N/A) as a surgical intervention .  The patient's history has been reviewed, patient examined, no change in status, stable for surgery.  I have reviewed the patient's chart and labs.  Questions were answered to the patient's satisfaction.     Quay Burow

## 2016-07-20 NOTE — Discharge Summary (Signed)
Discharge Summary    Patient ID: Wesley [name],  MRN: [id number], DOB/AGE: [birth year] 51 y.o.  Admit date: 07/19/2016 Discharge date: 07/20/2016  Primary Care Provider: Stoney Bang A Primary Cardiologist: Bronson Ing (New)  Discharge Diagnoses    Active Problems:   Tobacco abuse   Opiate dependence ([place])   Chronic respiratory failure (HCC)   Acute coronary syndrome (HCC)   Abnormal stress test   Unstable angina (HCC)   Chest pain   Allergies Allergies  Allergen Reactions  . Ambien [Zolpidem Tartrate] Other (See Comments)    hallucinations  . Melatonin Itching  . Zolpidem Other (See Comments)    Altered mental status    Diagnostic Studies/Procedures    LHC: 07/20/16  Conclusion     The left ventricular systolic function is normal.  LV end diastolic pressure is normal.    IMPRESSION: Wesley [name] has normal coronary arteries and normal LV function. I believe this chest pain was noncardiac and a stress test also positive. A femoral angiography was performed and a MYNX closure device was successfully deployed achieving hemostasis. The patient left the lab in stable condition.  Quay Burow. MD, [facility] _____________   History of Present Illness     Wesley [name] is a 51 y.o. male with history of advanced COPD and bullous emphysema s/p prior lung surgery, chronic pain managed at [facility] on opiates (remote work injury falling over 100 feet), neuropathy, history of post-operative DVT (not on [facility]), chronically low BP here with chest pain transferred from [facility].   Wesley [name] recently saw his PCP for complaints of worsening SOB and weakness. He has been having chest discomfort a few times per week, occurring primarily with activity such as walking - but the shortness of breath has been more prominent. He underwent outpatient nuclear stress test at [facility] showing high risk with having a moderate sized area of inducible reversibility, EF 48%. The  patient reports being told by nurse of ordering provider that he needed to see a cardiologist regarding the results. The night prior to admission he developed chest heaviness on/off all night long associated with arm numbness, with sometimes brief bursts of sharp CP. Due to persistent sx he came to [facility] ER where EKG did not indicate acute ischemic changes. Labs notable for mild hypokalemia of 3.4, troponin I <0.03, normal PT/INR. He was given aspirin 324mg , morphine, Zofran, started on heparin infusion for unstable angina, IV fluids for borderline hypotension, and transferred to [facility] for further care. He is using nicotine patches as he recently quit smoking after 40 years. He was currently chest pain free but did note diffuse shoulder and knee pain c/w known arthralgias for which he takes chronic opiates.  Hospital Course     He was admitted overnight with plans for cardiac cath. He ruled out. Underwent cardiac cath with Dr. Gwenlyn Found that showed normal coronaries, and normal LVEDP. He was continued on his home medications. Encouraged to follow up with his PCP. He was instructed to take an NSAID taper with Ibuprofen 800mg  TID x3 day, 600mg  TID x3 day, 400mg  TID x3 day, and 200mg  TID x3 day. This was reviewed with patient by Dr. Ellyn Hack prior to discharge.   Follow up with be arranged in the [place] office. Medications are listed below. _____________  Discharge Vitals Blood pressure (!) 112/57, pulse 82, temperature 97.9 F (36.6 C), temperature source Oral, resp. rate 14, height 6\' 1"  (1.854 m), weight 179 lb 1.6 oz (81.2 kg),  SpO2 96 %.  Filed Weights   07/19/16 1244 07/20/16 0454  Weight: 184 lb (83.5 kg) 179 lb 1.6 oz (81.2 kg)  General appearance: alert, cooperative, appears stated age, no distress. mildly obese Neck: no adenopathy, no carotid bruit and no JVD Lungs: Nonlabored. Diffusely diminished breath sounds throughout with an x-ray wheeze.  Heart: RRR. Normal S1 and S2. He has  diffuse chest wall tenderness from the left sternal border along his pectoral muscle to the side. Abdomen: soft, non-tender; bowel sounds normal; no masses,  no organomegaly;  Extremities: extremities normal, atraumatic, no cyanosis, and edema - none. Radial cath site clean and dry and intact. No bleeding. TR band placed. Pulses: 2+ and symmetric;  Neurologic: Mental status: Alert & oriented x 3, thought content appropriate; non-focal exam.  Pleasant mood & affect.   Labs & Radiologic Studies    CBC  Recent Labs  07/20/16 0226 07/20/16 0805  WBC 6.6 5.3  HGB 13.8 15.2  HCT 42.3 46.6  MCV 93.2 93.0  PLT 288 614   Basic Metabolic Panel  Recent Labs  07/19/16 1301 07/20/16 0226  NA 136 137  K 3.4* 4.5  CL 104 103  CO2 25 27  GLUCOSE 96 93  BUN 8 6  CREATININE 0.89 0.93  CALCIUM 8.9 8.8*   Liver Function Tests  Recent Labs  07/19/16 2112  AST 17  ALT 14*  ALKPHOS 84  BILITOT 0.3  PROT 6.5  ALBUMIN 3.5   No results for input(s): LIPASE, AMYLASE in the last 72 hours. Cardiac Enzymes  Recent Labs  07/19/16 2112 07/20/16 0226 07/20/16 0805  TROPONINI <0.03 <0.03 <0.03   BNP Invalid input(s): POCBNP D-Dimer No results for input(s): DDIMER in the last 72 hours. Hemoglobin A1C No results for input(s): HGBA1C in the last 72 hours. Fasting Lipid Panel  Recent Labs  07/20/16 0226  CHOL 173  HDL 41  LDLCALC 101*  TRIG 154*  CHOLHDL 4.2   Thyroid Function Tests  Recent Labs  07/19/16 2112  TSH 1.526   _____________  Dg Chest 2 View  Result Date: 07/19/2016 CLINICAL DATA:  51 year old male with chest pain radiating to the left arm. Abnormal nuclear medicine cardiac study yesterday. EXAM: CHEST  2 VIEW COMPARISON:  [facility] nuclear medicine cardiac study. Chest radiographs 04/02/2016 and earlier. FINDINGS: Chronic large lung volumes with emphysema. Resolved widespread right greater than left reticulonodular pulmonary opacity since  January. No pneumothorax, pulmonary edema, pleural effusion or acute pulmonary opacity. Mediastinal contours remain normal. Osteopenia. No acute osseous abnormality identified. Negative visible bowel gas pattern. IMPRESSION: 1. Chronic lung disease including emphysema. No superimposed acute findings identified. 2. See also [facility] cardiac nuclear medicine study 07/18/2016. Electronically Signed   By: Genevie Ann M.D.   On: 07/19/2016 13:52   Disposition   Pt is being discharged home today in good condition.  Follow-up Plans & Appointments    Follow-up Information    [name], MD Follow up.   Specialty:  Internal Medicine Contact information: [address] [place] [address] 008 [phone]            Discharge Medications   Discharge Medication List as of 07/20/2016 12:28 PM    START taking these medications   Details  aspirin 81 MG chewable tablet Chew 1 tablet (81 mg total) by mouth daily., Starting Thu 07/20/2016, No Print      CONTINUE these medications which have NOT CHANGED   Details  ADVAIR DISKUS 250-50 MCG/DOSE AEPB Inhale 1 puff into the lungs 2 (two) times daily., Starting 06/14/2015, Until Discontinued, Historical Med    albuterol (PROVENTIL HFA;VENTOLIN HFA) 108 (90 Base) MCG/ACT inhaler Inhale 2 puffs into the lungs every 4 (four) hours as needed for wheezing or shortness of breath. Shortness of breath, Starting Tue 01/11/2016, Print    albuterol (PROVENTIL) (2.5 MG/3ML) 0.083% nebulizer solution Take 3 mLs (2.5 mg total) by nebulization every 6 (six) hours as needed for wheezing or shortness of breath., Starting 02/27/2014, Until Discontinued, Print    budesonide-formoterol (SYMBICORT) 160-4.5 MCG/ACT inhaler Inhale 3 puffs into the lungs 4 (four) times daily., Until Discontinued, Historical Med    furosemide (LASIX) 40 MG tablet Take 40 mg by mouth daily., Until Discontinued, Historical Med    gabapentin (NEURONTIN) 300 MG capsule Take  600 mg by mouth 3 (three) times daily. , Until Discontinued, Historical Med    methocarbamol (ROBAXIN) 500 MG tablet Take 1 tablet (500 mg total) by mouth every 6 (six) hours as needed for muscle spasms., Starting Fri 09/24/2015, Print    montelukast (SINGULAIR) 10 MG tablet Take 1 tablet by mouth daily., Starting Fri 07/07/2016, Historical Med    naloxone HCl (NARCAN) 4 MG/0.1ML LIQD Place 4 mg into the nose as directed. To prevent overdose, Until Discontinued, Historical Med    nicotine (NICODERM CQ - DOSED IN MG/24 HOURS) 21 mg/24hr patch Place 21 mg onto the skin daily., Historical Med    oxyCODONE (OXYCONTIN) 30 MG 12 hr tablet Take 30 mg by mouth every 8 (eight) hours., Historical Med    !! oxycodone (ROXICODONE) 30 MG immediate release tablet Take 30 mg by mouth every 4 (four) hours., Historical Med    !! Oxycodone HCl 20 MG TABS Take 1 tablet (20 mg total) by mouth 3 (three) times daily., Starting Fri 10/15/2015, Print    OXYGEN Inhale 3 L into the lungs daily as needed., Historical Med    Polyethyl Glycol-Propyl Glycol (SYSTANE) 0.4-0.3 % SOLN Place 1-2 drops into the left eye daily as needed (for dry eye relief). , Until Discontinued, Historical Med    tiotropium (SPIRIVA) 18 MCG inhalation capsule Place 1 capsule (18 mcg total) into inhaler and inhale daily., Starting 07/24/2011, Until Discontinued, Print     !! - Potential duplicate medications found. Please discuss with provider.    STOP taking these medications     aspirin 325 MG tablet      levofloxacin (LEVAQUIN) 750 MG tablet      ondansetron (ZOFRAN ODT) 4 MG disintegrating tablet      predniSONE (DELTASONE) 5 MG tablet          Outstanding Labs/Studies   N/A  Duration of Discharge Encounter   Greater than 30 minutes including physician time.  Signed, Reino Bellis NP-C 07/20/2016, 1:40 PM   I saw the patient just prior to his catheterization and then shortly thereafter. No significant coronary disease  noted on cath with normal filling pressures. False positive nuclear stress test with noncardiac chest pain. He may very well have some musculoskeletal costochondritis pain versus potentially pericarditis. We will discharge him today with plans to use a NSAID taper to treat him for inflammatory disease - either pericarditis or costochondritis.  He will get an outpatient echocardiogram to exclude pericardial effusion. He will follow-up with Dr. Bronson Ing in [place] to discuss Sx & Echo findings.  If normal - suspect no further cardiac evaluation is necessary.   Glenetta Hew, MD Glenetta Hew, M.D.,  M.S. Interventional Cardiologist   Pager # [phone] Phone # [phone] [address] [place], [place] [address]

## 2016-07-20 NOTE — Progress Notes (Signed)
Spoke with Reino Bellis NP about reording pt's home pain meds. Verified pt is followed by [facility] [place]. We have documentation of prescribed meds in epic under office visits. Reino Bellis NP will restart medications. Carroll Kinds RN

## 2016-07-20 NOTE — Care Management CC44 (Signed)
Condition Code 44 Documentation Completed  Patient Details  Name: Wesley [name] MRN: [id number] Date of Birth: 05-23-1965   Condition Code 44 given:  Yes Patient signature on Condition Code 44 notice:  Yes Documentation of 2 MD's agreement:  Yes Code 44 added to claim:  Yes    Bethena Roys, RN 07/20/2016, 12:03 PM

## 2016-07-20 NOTE — Discharge Instructions (Signed)

## 2016-07-20 NOTE — Progress Notes (Signed)
ANTICOAGULATION CONSULT NOTE - Follow-up Consult  Pharmacy Consult for Heparin Indication: chest pain/ACS  Allergies  Allergen Reactions  . Ambien [Zolpidem Tartrate] Other (See Comments)    hallucinations  . Melatonin Itching  . Zolpidem Other (See Comments)    Altered mental status    Patient Measurements: Height: 6\' 1"  (185.4 cm) Weight: 184 lb (83.5 kg) IBW/kg (Calculated) : 79.9 HEPARIN DW (KG): 83.5  Vital Signs: Temp: 98.1 F (36.7 C) (05/16 1951) Temp Source: Oral (05/16 1951) BP: 96/46 (05/16 1951) Pulse Rate: 79 (05/16 1951)  Labs:  Recent Labs  07/19/16 1301 07/19/16 2112 07/20/16 0226  HGB 14.5  --  13.8  HCT 42.7  --  42.3  PLT 307  --  288  LABPROT 12.4  --   --   INR 0.93  --   --   HEPARINUNFRC  --  0.25* 0.24*  CREATININE 0.89  --   --   TROPONINI <0.03 <0.03 <0.03    Estimated Creatinine Clearance: 108.5 mL/min (by C-G formula based on SCr of 0.89 mg/dL).  Assessment: 51 yo male on heparin for r/o ACS. Heparin level subtherapeutic (0.24) on gtt at 1150 units/hr. CBC stable. No issues with line or bleeding reported per RN.  Goal of Therapy:  Heparin level 0.3-0.7 units/ml Monitor platelets by anticoagulation protocol: Yes   Plan:  Increase heparin to 1350 units/hr Will f/u 6 hr heparin level  Sherlon Handing, PharmD, BCPS Clinical pharmacist, pager [phone] 07/20/2016,4:08 AM

## 2016-07-20 NOTE — Progress Notes (Signed)
Discharge instructions given to pt and wife with understanding. Pt states he does not need oxygen for ride home (prn at home). Carroll Kinds RN

## 2016-07-21 LAB — HEMOGLOBIN A1C
Hgb A1c MFr Bld: 5.8 % — ABNORMAL HIGH (ref 4.8–5.6)
Mean Plasma Glucose: 120 mg/dL

## 2016-07-28 ENCOUNTER — Emergency Department (HOSPITAL_COMMUNITY): Payer: Medicare HMO

## 2016-07-28 ENCOUNTER — Encounter (HOSPITAL_COMMUNITY): Payer: Self-pay | Admitting: Emergency Medicine

## 2016-07-28 ENCOUNTER — Emergency Department (HOSPITAL_COMMUNITY)
Admission: EM | Admit: 2016-07-28 | Discharge: 2016-07-29 | Disposition: A | Discharge status: Home / Self Care | Payer: Medicare HMO | Attending: Emergency Medicine | Admitting: Emergency Medicine

## 2016-07-28 DIAGNOSIS — Z7982 Long term (current) use of aspirin: Secondary | ICD-10-CM | POA: Diagnosis not present

## 2016-07-28 DIAGNOSIS — R0789 Other chest pain: Secondary | ICD-10-CM | POA: Diagnosis present

## 2016-07-28 DIAGNOSIS — Z79899 Other long term (current) drug therapy: Secondary | ICD-10-CM | POA: Insufficient documentation

## 2016-07-28 DIAGNOSIS — Z87891 Personal history of nicotine dependence: Secondary | ICD-10-CM | POA: Diagnosis not present

## 2016-07-28 DIAGNOSIS — R079 Chest pain, unspecified: Secondary | ICD-10-CM

## 2016-07-28 DIAGNOSIS — J449 Chronic obstructive pulmonary disease, unspecified: Secondary | ICD-10-CM | POA: Diagnosis not present

## 2016-07-28 LAB — CBC
HCT: 41.5 % (ref 39.0–52.0)
HEMOGLOBIN: 14.2 g/dL (ref 13.0–17.0)
MCH: 30.8 pg (ref 26.0–34.0)
MCHC: 34.2 g/dL (ref 30.0–36.0)
MCV: 90 fL (ref 78.0–100.0)
PLATELETS: 305 10*3/uL (ref 150–400)
RBC: 4.61 MIL/uL (ref 4.22–5.81)
RDW: 12.3 % (ref 11.5–15.5)
WBC: 8.2 10*3/uL (ref 4.0–10.5)

## 2016-07-28 LAB — BASIC METABOLIC PANEL
ANION GAP: 7 (ref 5–15)
BUN: 12 mg/dL (ref 6–20)
CO2: 24 mmol/L (ref 22–32)
CREATININE: 0.92 mg/dL (ref 0.61–1.24)
Calcium: 8.9 mg/dL (ref 8.9–10.3)
Chloride: 106 mmol/L (ref 101–111)
GFR calc non Af Amer: >60 mL/min (ref >60)
Glucose, Bld: 149 mg/dL — ABNORMAL HIGH (ref 65–99)
Potassium: 3.3 mmol/L — ABNORMAL LOW (ref 3.5–5.1)
SODIUM: 137 mmol/L (ref 135–145)

## 2016-07-28 LAB — TROPONIN I

## 2016-07-28 LAB — D-DIMER, QUANTITATIVE: D-Dimer, Quant: 1.37 ug{FEU}/mL — ABNORMAL HIGH (ref 0.00–0.50)

## 2016-07-28 MED ORDER — FENTANYL CITRATE (PF) 100 MCG/2ML IJ SOLN
50.0000 ug | Freq: Once | INTRAMUSCULAR | Status: AC
  Administered 2016-07-28: 50 ug via INTRAVENOUS
  Filled 2016-07-28: qty 2

## 2016-07-28 MED ORDER — IOPAMIDOL (ISOVUE-370) INJECTION 76%
100.0000 mL | Freq: Once | INTRAVENOUS | Status: AC | PRN
  Administered 2016-07-28: 100 mL via INTRAVENOUS

## 2016-07-28 NOTE — ED Triage Notes (Signed)
Pt reports chest pain since 8am this morning progressing through the day.  Just was at [facility] a week ago.  Pain down left arm, shortness of breath, and nausea/vomiting.

## 2016-07-28 NOTE — ED Notes (Signed)
Pt states CP began around 0800 this morning, took 81mg  ASA today. Describes pain as pressure in central chest to L arm.

## 2016-07-28 NOTE — Discharge Instructions (Signed)
Tests show no life-threatening condition. Follow-up your primary care doctor. Return if worse.

## 2016-07-28 NOTE — ED Provider Notes (Signed)
[place] DEPT Provider Note   CSN: [id number] Arrival date & time: 07/28/16  1845     History   Chief Complaint Chief Complaint  Patient presents with  . Chest Pain    HPI Wesley [name] is a 51 y.o. male.  Chest pain since 8 AM this morning described as a stabbing sensation and someone standing on his chest with radiation to left arm. Status post cardiac catheterization on 07/20/16 with normal coronary arteries. Patient complains of nausea and diaphoresis. He has chronic pain and takes large doses of opiates. Severity of pain is moderate. Nothing makes symptoms better or worse.      Past Medical History:  Diagnosis Date  . Avascular necrosis of femur ([place])   . Back pain   . COPD (chronic obstructive pulmonary disease) ([place])   . Neuropathy   . On home oxygen therapy    "3L prn" (07/19/2016)  . Osteoporosis   . Pulmonary asbestosis ([place])   . 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 Chest pain 07/20/2016  . Acute coronary syndrome ([place]) 07/19/2016  . Abnormal stress test 07/19/2016  . Unstable angina ([place]) 07/19/2016  . Sepsis ([place]) 03/30/2016  . HCAP (healthcare-associated pneumonia) 03/30/2016  . Hypokalemia 01/08/2016  . History of DVT (deep vein thrombosis) 01/08/2016  . OA (osteoarthritis) of hip 09/24/2015  . Community acquired pneumonia 04/14/2015  . Vomiting and diarrhea   . Solitary pulmonary nodule 05/18/2014  . Nausea and vomiting 05/16/2014  . Bronchitis, acute, with bronchospasm 02/24/2014  . Osteoporosis, unspecified 08/06/2012  . Hip pain 08/06/2012  . Opiate abuse, continuous 01/05/2012  . Opiate dependence ([place]) 01/03/2012  . Chronic respiratory failure ([place]) 01/03/2012  . COPD exacerbation ([place]) 01/03/2012  . Pulmonary infection 01/03/2012  . Pneumonia 07/19/2011  . Fever 07/18/2011  . Myalgia 07/18/2011  . Leukocytosis 07/18/2011  . COPD  (chronic obstructive pulmonary disease) ([place]) 07/18/2011  . Acute-on-chronic respiratory failure ([place]) 07/18/2011  . Tachycardia 07/18/2011  . Dehydration 07/18/2011  . Chronic pain 07/18/2011  . Tobacco abuse 07/18/2011    Past Surgical History:  Procedure Laterality Date  . BACK SURGERY    . EYE SURGERY     left eye cornea and lens replaced- blind in left eye  . JOINT REPLACEMENT    . LEFT HEART CATH AND CORONARY ANGIOGRAPHY N/A 07/20/2016   Procedure: Left Heart Cath and Coronary Angiography;  Surgeon: Lorretta Harp, MD;  Location: [place] [facility] CV LAB;  Service: Cardiovascular;  Laterality: N/A;  . LUNG SURGERY    . TOTAL HIP ARTHROPLASTY Right 09/24/2015   Procedure: TOTAL HIP ARTHROPLASTY ANTERIOR APPROACH;  Surgeon: Gaynelle Arabian, MD;  Location: [facility] ORS;  Service: Orthopedics;  Laterality: Right;       Home Medications    Prior to Admission medications   Medication Sig Start Date End Date Taking? Authorizing Provider  ADVAIR DISKUS 250-50 MCG/DOSE AEPB Inhale 1 puff into the lungs 2 (two) times daily. 06/14/15  Yes [provider]  albuterol (PROVENTIL HFA;VENTOLIN HFA) 108 (90 Base) MCG/ACT inhaler Inhale 2 puffs into the lungs every 4 (four) hours as needed for wheezing or shortness of breath. Shortness of breath 01/11/16  Yes Isaac Bliss, Rayford Halsted, MD  albuterol (PROVENTIL) (2.5 MG/3ML) 0.083% nebulizer solution Take 3 mLs (2.5 mg total) by nebulization every 6 (six) hours as needed for wheezing or  shortness of breath. Patient taking differently: Take 2.5 mg by nebulization every 4 (four) hours as needed for wheezing or shortness of breath.  02/27/14  Yes Kathie Dike, MD  aspirin 81 MG chewable tablet Chew 1 tablet (81 mg total) by mouth daily. 07/20/16  Yes Cheryln Manly, NP  budesonide-formoterol (SYMBICORT) 160-4.5 MCG/ACT inhaler Inhale 3 puffs into the lungs 4 (four) times daily.   Yes [provider]  furosemide (LASIX) 40 MG tablet Take  40 mg by mouth daily.   Yes [provider]  gabapentin (NEURONTIN) 300 MG capsule Take 600 mg by mouth 3 (three) times daily.    Yes [provider]  montelukast (SINGULAIR) 10 MG tablet Take 1 tablet by mouth daily. 07/07/16  Yes [provider]  naloxone HCl (NARCAN) 4 MG/0.1ML LIQD Place 4 mg into the nose as directed. To prevent overdose   Yes [provider]  nicotine (NICODERM CQ - DOSED IN MG/24 HOURS) 21 mg/24hr patch Place 21 mg onto the skin daily.   Yes [provider]  oxyCODONE (OXYCONTIN) 30 MG 12 hr tablet Take 30 mg by mouth every 8 (eight) hours.   Yes [provider]  oxycodone (ROXICODONE) 30 MG immediate release tablet Take 30 mg by mouth every 4 (four) hours.   Yes [provider]  OXYGEN Inhale 3 L into the lungs daily as needed.   Yes [provider]  Polyethyl Glycol-Propyl Glycol (SYSTANE) 0.4-0.3 % SOLN Place 1-2 drops into the left eye daily as needed (for dry eye relief).    Yes [provider]  tiotropium (SPIRIVA) 18 MCG inhalation capsule Place 1 capsule (18 mcg total) into inhaler and inhale daily. 07/24/11  Yes Doree Albee, MD    Family History Family History  Problem Relation Age of Onset  . Heart failure Father   . Diabetes Father   . Diabetes Mother   . Cancer Mother        male cancer    Social History Social History  Substance Use Topics  . Smoking status: Former Smoker    Packs/day: 1.00    Quit date: 07/05/2011  . Smokeless tobacco: Never Used  . Alcohol use No     Allergies   Ambien [zolpidem tartrate]; Melatonin; and Zolpidem   Review of Systems Review of Systems  All other systems reviewed and are negative.    Physical Exam Updated Vital Signs BP 107/61   Pulse 75   Temp 98.2 F (36.8 C) (Oral)   Resp 17   Ht 6\' 1"  (1.854 m)   Wt 81.2 kg (179 lb)   SpO2 100%   BMI 23.62 kg/m   Physical Exam  Constitutional: He is oriented to person,  place, and time. He appears well-developed and well-nourished.  HENT:  Head: Normocephalic and atraumatic.  Eyes: Conjunctivae are normal.  Neck: Neck supple.  Cardiovascular: Normal rate and regular rhythm.   Pulmonary/Chest: Effort normal and breath sounds normal.  Abdominal: Soft. Bowel sounds are normal.  Musculoskeletal: Normal range of motion.  Neurological: He is alert and oriented to person, place, and time.  Skin: Skin is warm and dry.  Psychiatric: He has a normal mood and affect. His behavior is normal.  Nursing note and vitals reviewed.    ED Treatments / Results  Labs (all labs ordered are listed, but only abnormal results are displayed) Labs Reviewed  BASIC METABOLIC PANEL - Abnormal; Notable for the following:       Result  Value   Potassium 3.3 (*)    Glucose, Bld 149 (*)    All other components within normal limits  D-DIMER, QUANTITATIVE (NOT AT [facility]) - Abnormal; Notable for the following:    D-Dimer, Quant 1.37 (*)    All other components within normal limits  CBC  TROPONIN I    EKG  EKG Interpretation  Date/Time:  Friday Jul 28 2016 18:53:29 EDT Ventricular Rate:  103 PR Interval:    QRS Duration: 93 QT Interval:  353 QTC Calculation: 463 R Axis:   65 Text Interpretation:  Sinus tachycardia Consider left atrial enlargement Confirmed by Lacinda Axon  MD, Anisa Leanos ([id number]) on 07/28/2016 11:15:07 PM       Radiology Dg Chest 2 View  Result Date: 07/28/2016 CLINICAL DATA:  Chest pain EXAM: CHEST  2 VIEW COMPARISON:  07/19/2016, 04/02/2016, 04/01/2016 FINDINGS: Hyperinflation. Left lower peripheral opacity does not show significant interval change. Normal cardiomediastinal silhouette. Known pneumothorax. IMPRESSION: 1. Hyperinflation without new pulmonary infiltrate 2. Minimal left lower lung peripheral opacity, probably not significantly changed. Electronically Signed   By: Donavan Foil M.D.   On: 07/28/2016 19:32    Procedures Procedures (including critical  care time)  Medications Ordered in ED Medications  fentaNYL (SUBLIMAZE) injection 50 mcg (50 mcg Intravenous Given 07/28/16 2006)  fentaNYL (SUBLIMAZE) injection 50 mcg (50 mcg Intravenous Given 07/28/16 2104)  fentaNYL (SUBLIMAZE) injection 50 mcg (50 mcg Intravenous Given 07/28/16 2134)  iopamidol (ISOVUE-370) 76 % injection 100 mL (100 mLs Intravenous Contrast Given 07/28/16 2336)  fentaNYL (SUBLIMAZE) injection 50 mcg (50 mcg Intravenous Given 07/28/16 2349)     Initial Impression / Assessment and Plan / ED Course  I have reviewed the triage vital signs and the nursing notes.  Pertinent labs & imaging results that were available during my care of the patient were reviewed by me and considered in my medical decision making (see chart for details).     Patient presents with chest pain. Recent cardiac catheterization showed normal coronary arteries.  He takes large doses of opiates. First troponin was negative. D-dimer elevated. CT angiogram of chest pending. Discussed with Dr. Betsey Holiday  Final Clinical Impressions(s) / ED Diagnoses   Final diagnoses:  Chest pain  Chest pain, unspecified type    New Prescriptions New Prescriptions   No medications on file     Nat Christen, MD 07/29/16 0003

## 2016-07-29 NOTE — ED Notes (Addendum)
Pt wheeled to waiting room. Pt verbalized understanding of discharge instructions. Pt stated "I can make it home without my oxygen tank, I am going right around the corner."

## 2016-08-01 ENCOUNTER — Ambulatory Visit: Payer: Medicare HMO | Admitting: Physician Assistant

## 2016-08-02 ENCOUNTER — Ambulatory Visit (INDEPENDENT_AMBULATORY_CARE_PROVIDER_SITE_OTHER): Payer: Medicare HMO | Admitting: Cardiology

## 2016-08-02 ENCOUNTER — Encounter: Payer: Self-pay | Admitting: Cardiology

## 2016-08-02 VITALS — BP 98/66 | HR 92 | Ht 73.0 in | Wt 181.4 lb

## 2016-08-02 DIAGNOSIS — G8921 Chronic pain due to trauma: Secondary | ICD-10-CM

## 2016-08-02 DIAGNOSIS — R072 Precordial pain: Secondary | ICD-10-CM

## 2016-08-02 DIAGNOSIS — Z0389 Encounter for observation for other suspected diseases and conditions ruled out: Secondary | ICD-10-CM

## 2016-08-02 DIAGNOSIS — J431 Panlobular emphysema: Secondary | ICD-10-CM

## 2016-08-02 DIAGNOSIS — IMO0001 Reserved for inherently not codable concepts without codable children: Secondary | ICD-10-CM | POA: Insufficient documentation

## 2016-08-02 NOTE — Progress Notes (Signed)
08/02/2016 Wesley [name]   1965-03-25  [id number]  Primary Physician [name], MD Primary Cardiologist: Dr Grace Bushy  HPI:  51 y.o. male with history of advanced COPD and bullous emphysema s/p lung surgery two years ago, chronic pain managed at [facility] on opiates (remote work injury falling over 100 feet), neuropathy, history of post-operative DVT (not on [facility]), and chronically low BP, admitted to [facility] with chest pain. 07/19/16. He has a history of a recent abnormal Myoview at [facility]. He was transferred to [facility] for cath which revealed normal coronaries and normal LVF. There was some concern he might have pericarditis and he is in the office today for follow up. Echo has not been done yet.  The pt continues to have chest pain and Lt arm pain and in fact was just in the ED 07/28/16. A CTA was done and was negative for PE. His pain is not positional, not pleuritic. No obvious aggravating factor.    Current Outpatient Prescriptions  Medication Sig Dispense Refill  . ADVAIR DISKUS 250-50 MCG/DOSE AEPB Inhale 1 puff into the lungs 2 (two) times daily.  0  . albuterol (PROVENTIL HFA;VENTOLIN HFA) 108 (90 Base) MCG/ACT inhaler Inhale 2 puffs into the lungs every 4 (four) hours as needed for wheezing or shortness of breath. Shortness of breath 1 Inhaler 12  . albuterol (PROVENTIL) (2.5 MG/3ML) 0.083% nebulizer solution Take 3 mLs (2.5 mg total) by nebulization every 6 (six) hours as needed for wheezing or shortness of breath. (Patient taking differently: Take 2.5 mg by nebulization every 4 (four) hours as needed for wheezing or shortness of breath. ) 75 mL 12  . aspirin 81 MG chewable tablet Chew 1 tablet (81 mg total) by mouth daily.    . budesonide-formoterol (SYMBICORT) 160-4.5 MCG/ACT inhaler Inhale 3 puffs into the lungs 4 (four) times daily.    . furosemide (LASIX) 40 MG tablet Take 40 mg by mouth daily.    Marland Kitchen gabapentin (NEURONTIN) 300 MG capsule Take 600 mg by mouth 3 (three)  times daily.     . montelukast (SINGULAIR) 10 MG tablet Take 1 tablet by mouth daily.    . naloxone HCl (NARCAN) 4 MG/0.1ML LIQD Place 4 mg into the nose as directed. To prevent overdose    . nicotine (NICODERM CQ - DOSED IN MG/24 HOURS) 21 mg/24hr patch Place 21 mg onto the skin daily.    Marland Kitchen oxyCODONE (OXYCONTIN) 30 MG 12 hr tablet Take 30 mg by mouth every 8 (eight) hours.    Marland Kitchen oxycodone (ROXICODONE) 30 MG immediate release tablet Take 30 mg by mouth every 4 (four) hours.    . OXYGEN Inhale 3 L into the lungs daily as needed.    Vladimir Faster Glycol-Propyl Glycol (SYSTANE) 0.4-0.3 % SOLN Place 1-2 drops into the left eye daily as needed (for dry eye relief).     Marland Kitchen tiotropium (SPIRIVA) 18 MCG inhalation capsule Place 1 capsule (18 mcg total) into inhaler and inhale daily. 30 capsule 0   No current facility-administered medications for this visit.     Allergies  Allergen Reactions  . Ambien [Zolpidem Tartrate] Other (See Comments)    hallucinations  . Melatonin Itching  . Zolpidem Other (See Comments)    Altered mental status    Past Medical History:  Diagnosis Date  . Avascular necrosis of femur ([place])   . Back pain   . COPD (chronic obstructive pulmonary disease) ([place])   . Neuropathy   . On home  oxygen therapy    "3L prn" (07/19/2016)  . Osteoporosis   . Pulmonary asbestosis ([place])   . Tumor of lung    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, 2017    Social History   Social History  . Marital status: Married    Spouse name: N/A  . Number of children: N/A  . Years of education: N/A   Occupational History  . Not on file.   Social History Main Topics  . Smoking status: Former Smoker    Packs/day: 1.00    Quit date: 07/05/2011  . Smokeless tobacco: Never Used  . Alcohol use No  . Drug use: No     Comment: takes these medications as prescribed by Physician for pain!  . Sexual activity: Not Currently   Other Topics Concern  . Not on file   Social History  Narrative  . No narrative on file     Family History  Problem Relation Age of Onset  . Heart failure Father   . Diabetes Father   . Diabetes Mother   . Cancer Mother        male cancer     Review of Systems: General: negative for chills, fever, night sweats or weight changes.  Cardiovascular: negative for chest pain, dyspnea on exertion, edema, orthopnea, palpitations, paroxysmal nocturnal dyspnea or shortness of breath Dermatological: negative for rash Respiratory: negative for cough or wheezing Urologic: negative for hematuria Abdominal: negative for nausea, vomiting, diarrhea, bright red blood per rectum, melena, or hematemesis Neurologic: negative for visual changes, syncope, or dizziness All other systems reviewed and are otherwise negative except as noted above.    Blood pressure 98/66, pulse 92, height 6\' 1"  (1.854 m), weight 181 lb 6.4 oz (82.3 kg), SpO2 94 %.  General appearance: alert, cooperative and no distress Neck: no carotid bruit and no JVD Lungs: diffuse wheezing Extremities: no edema Skin: no edema Neurologic: Grossly normal    ASSESSMENT AND PLAN:   Chest pain Unclear etiology- MI, CAD, PE r/o  Normal coronary arteries By cath 07/18/16 after false positive Myoview  Chronic pain S/P trauma, fell from a Ladder truck [employer]administrative [employer])  COPD (chronic obstructive pulmonary disease) (HCC) Severe COPD, s/p reduction surgery   PLAN  Will check echo, if no evidence of pericarditis he could f/u PRN.   Kerin Ransom PA-C 08/02/2016 4:40 PM

## 2016-08-02 NOTE — Assessment & Plan Note (Signed)
Unclear etiology- MI, CAD, PE r/o

## 2016-08-02 NOTE — Assessment & Plan Note (Signed)
S/P trauma, fell from a Ladder truck [employer]administrative [employer])

## 2016-08-02 NOTE — Assessment & Plan Note (Signed)
By cath 07/18/16 after false positive Myoview

## 2016-08-02 NOTE — Assessment & Plan Note (Signed)
Severe COPD, s/p reduction surgery

## 2016-08-02 NOTE — Patient Instructions (Signed)
Your physician recommends that you schedule a follow-up appointment in: 2 weeks Dr Bronson Ing    Your physician recommends that you continue on your current medications as directed. Please refer to the Current Medication list given to you today.    Your physician has requested that you have an echocardiogram. Echocardiography is a painless test that uses sound waves to create images of your heart. It provides your doctor with information about the size and shape of your heart and how well your heart's chambers and valves are working. This procedure takes approximately one hour. There are no restrictions for this procedure.      Thank you for choosing [facility] !

## 2016-08-09 ENCOUNTER — Ambulatory Visit (HOSPITAL_COMMUNITY): Payer: Medicare HMO | Attending: Cardiology

## 2016-08-09 DIAGNOSIS — R0602 Shortness of breath: Secondary | ICD-10-CM | POA: Diagnosis not present

## 2016-08-10 DIAGNOSIS — E119 Type 2 diabetes mellitus without complications: Secondary | ICD-10-CM | POA: Diagnosis not present

## 2016-08-10 DIAGNOSIS — R69 Illness, unspecified: Secondary | ICD-10-CM | POA: Diagnosis not present

## 2016-08-10 DIAGNOSIS — J189 Pneumonia, unspecified organism: Secondary | ICD-10-CM | POA: Diagnosis not present

## 2016-08-10 DIAGNOSIS — G8929 Other chronic pain: Secondary | ICD-10-CM | POA: Diagnosis not present

## 2016-08-10 DIAGNOSIS — J440 Chronic obstructive pulmonary disease with acute lower respiratory infection: Secondary | ICD-10-CM | POA: Diagnosis not present

## 2016-08-10 DIAGNOSIS — J154 Pneumonia due to other streptococci: Secondary | ICD-10-CM | POA: Diagnosis not present

## 2016-08-10 DIAGNOSIS — M170 Bilateral primary osteoarthritis of knee: Secondary | ICD-10-CM | POA: Diagnosis not present

## 2016-08-10 DIAGNOSIS — J441 Chronic obstructive pulmonary disease with (acute) exacerbation: Secondary | ICD-10-CM | POA: Diagnosis not present

## 2016-08-10 DIAGNOSIS — J439 Emphysema, unspecified: Secondary | ICD-10-CM | POA: Diagnosis not present

## 2016-08-10 DIAGNOSIS — M545 Low back pain: Secondary | ICD-10-CM | POA: Diagnosis not present

## 2016-08-10 DIAGNOSIS — J15212 Pneumonia due to Methicillin resistant Staphylococcus aureus: Secondary | ICD-10-CM | POA: Diagnosis not present

## 2016-08-10 DIAGNOSIS — Z79891 Long term (current) use of opiate analgesic: Secondary | ICD-10-CM | POA: Diagnosis not present

## 2016-08-18 ENCOUNTER — Encounter: Payer: Self-pay | Admitting: Cardiovascular Disease

## 2016-08-18 ENCOUNTER — Ambulatory Visit: Payer: Medicare HMO | Admitting: Cardiovascular Disease

## 2016-08-28 DIAGNOSIS — K219 Gastro-esophageal reflux disease without esophagitis: Secondary | ICD-10-CM | POA: Diagnosis not present

## 2016-08-28 DIAGNOSIS — Z8673 Personal history of transient ischemic attack (TIA), and cerebral infarction without residual deficits: Secondary | ICD-10-CM | POA: Diagnosis not present

## 2016-08-28 DIAGNOSIS — J440 Chronic obstructive pulmonary disease with acute lower respiratory infection: Secondary | ICD-10-CM | POA: Diagnosis not present

## 2016-08-28 DIAGNOSIS — Z7982 Long term (current) use of aspirin: Secondary | ICD-10-CM | POA: Diagnosis not present

## 2016-08-28 DIAGNOSIS — Z7951 Long term (current) use of inhaled steroids: Secondary | ICD-10-CM | POA: Diagnosis not present

## 2016-08-28 DIAGNOSIS — Z9981 Dependence on supplemental oxygen: Secondary | ICD-10-CM | POA: Diagnosis not present

## 2016-08-28 DIAGNOSIS — R69 Illness, unspecified: Secondary | ICD-10-CM | POA: Diagnosis not present

## 2016-08-28 DIAGNOSIS — M545 Low back pain: Secondary | ICD-10-CM | POA: Diagnosis not present

## 2016-08-28 DIAGNOSIS — J189 Pneumonia, unspecified organism: Secondary | ICD-10-CM | POA: Diagnosis not present

## 2016-08-28 DIAGNOSIS — L139 Bullous disorder, unspecified: Secondary | ICD-10-CM | POA: Diagnosis not present

## 2016-08-29 DIAGNOSIS — Z5181 Encounter for therapeutic drug level monitoring: Secondary | ICD-10-CM | POA: Diagnosis not present

## 2016-08-29 DIAGNOSIS — M25561 Pain in right knee: Secondary | ICD-10-CM | POA: Diagnosis not present

## 2016-08-29 DIAGNOSIS — M25551 Pain in right hip: Secondary | ICD-10-CM | POA: Diagnosis not present

## 2016-08-29 DIAGNOSIS — G8922 Chronic post-thoracotomy pain: Secondary | ICD-10-CM | POA: Diagnosis not present

## 2016-08-29 DIAGNOSIS — G8929 Other chronic pain: Secondary | ICD-10-CM | POA: Diagnosis not present

## 2016-08-29 DIAGNOSIS — Z79899 Other long term (current) drug therapy: Secondary | ICD-10-CM | POA: Diagnosis not present

## 2016-09-01 DIAGNOSIS — J189 Pneumonia, unspecified organism: Secondary | ICD-10-CM | POA: Diagnosis not present

## 2016-09-01 DIAGNOSIS — Z7982 Long term (current) use of aspirin: Secondary | ICD-10-CM | POA: Diagnosis not present

## 2016-09-01 DIAGNOSIS — L139 Bullous disorder, unspecified: Secondary | ICD-10-CM | POA: Diagnosis not present

## 2016-09-01 DIAGNOSIS — Z9981 Dependence on supplemental oxygen: Secondary | ICD-10-CM | POA: Diagnosis not present

## 2016-09-01 DIAGNOSIS — R69 Illness, unspecified: Secondary | ICD-10-CM | POA: Diagnosis not present

## 2016-09-01 DIAGNOSIS — J440 Chronic obstructive pulmonary disease with acute lower respiratory infection: Secondary | ICD-10-CM | POA: Diagnosis not present

## 2016-09-01 DIAGNOSIS — K219 Gastro-esophageal reflux disease without esophagitis: Secondary | ICD-10-CM | POA: Diagnosis not present

## 2016-09-01 DIAGNOSIS — M545 Low back pain: Secondary | ICD-10-CM | POA: Diagnosis not present

## 2016-09-01 DIAGNOSIS — Z7951 Long term (current) use of inhaled steroids: Secondary | ICD-10-CM | POA: Diagnosis not present

## 2016-09-01 DIAGNOSIS — Z8673 Personal history of transient ischemic attack (TIA), and cerebral infarction without residual deficits: Secondary | ICD-10-CM | POA: Diagnosis not present

## 2016-09-04 DIAGNOSIS — Z7951 Long term (current) use of inhaled steroids: Secondary | ICD-10-CM | POA: Diagnosis not present

## 2016-09-04 DIAGNOSIS — J189 Pneumonia, unspecified organism: Secondary | ICD-10-CM | POA: Diagnosis not present

## 2016-09-04 DIAGNOSIS — Z8673 Personal history of transient ischemic attack (TIA), and cerebral infarction without residual deficits: Secondary | ICD-10-CM | POA: Diagnosis not present

## 2016-09-04 DIAGNOSIS — R69 Illness, unspecified: Secondary | ICD-10-CM | POA: Diagnosis not present

## 2016-09-04 DIAGNOSIS — Z7982 Long term (current) use of aspirin: Secondary | ICD-10-CM | POA: Diagnosis not present

## 2016-09-04 DIAGNOSIS — Z9981 Dependence on supplemental oxygen: Secondary | ICD-10-CM | POA: Diagnosis not present

## 2016-09-04 DIAGNOSIS — M545 Low back pain: Secondary | ICD-10-CM | POA: Diagnosis not present

## 2016-09-04 DIAGNOSIS — L139 Bullous disorder, unspecified: Secondary | ICD-10-CM | POA: Diagnosis not present

## 2016-09-04 DIAGNOSIS — K219 Gastro-esophageal reflux disease without esophagitis: Secondary | ICD-10-CM | POA: Diagnosis not present

## 2016-09-04 DIAGNOSIS — J440 Chronic obstructive pulmonary disease with acute lower respiratory infection: Secondary | ICD-10-CM | POA: Diagnosis not present

## 2016-09-19 DIAGNOSIS — J449 Chronic obstructive pulmonary disease, unspecified: Secondary | ICD-10-CM | POA: Diagnosis not present

## 2016-09-19 DIAGNOSIS — G894 Chronic pain syndrome: Secondary | ICD-10-CM | POA: Diagnosis not present

## 2016-09-19 DIAGNOSIS — K5900 Constipation, unspecified: Secondary | ICD-10-CM | POA: Diagnosis not present

## 2016-09-19 DIAGNOSIS — Z6823 Body mass index (BMI) 23.0-23.9, adult: Secondary | ICD-10-CM | POA: Diagnosis not present

## 2016-09-19 DIAGNOSIS — Z0000 Encounter for general adult medical examination without abnormal findings: Secondary | ICD-10-CM | POA: Diagnosis not present

## 2016-09-19 DIAGNOSIS — K08409 Partial loss of teeth, unspecified cause, unspecified class: Secondary | ICD-10-CM | POA: Diagnosis not present

## 2016-09-19 DIAGNOSIS — M79673 Pain in unspecified foot: Secondary | ICD-10-CM | POA: Diagnosis not present

## 2016-09-19 DIAGNOSIS — Z7982 Long term (current) use of aspirin: Secondary | ICD-10-CM | POA: Diagnosis not present

## 2016-09-19 DIAGNOSIS — M79676 Pain in unspecified toe(s): Secondary | ICD-10-CM | POA: Diagnosis not present

## 2016-09-19 DIAGNOSIS — M545 Low back pain: Secondary | ICD-10-CM | POA: Diagnosis not present

## 2016-10-24 DIAGNOSIS — M25551 Pain in right hip: Secondary | ICD-10-CM | POA: Diagnosis not present

## 2016-10-24 DIAGNOSIS — M25561 Pain in right knee: Secondary | ICD-10-CM | POA: Diagnosis not present

## 2016-10-24 DIAGNOSIS — Z79891 Long term (current) use of opiate analgesic: Secondary | ICD-10-CM | POA: Diagnosis not present

## 2016-10-24 DIAGNOSIS — Z79899 Other long term (current) drug therapy: Secondary | ICD-10-CM | POA: Diagnosis not present

## 2016-10-24 DIAGNOSIS — Z5181 Encounter for therapeutic drug level monitoring: Secondary | ICD-10-CM | POA: Diagnosis not present

## 2016-10-24 DIAGNOSIS — G8922 Chronic post-thoracotomy pain: Secondary | ICD-10-CM | POA: Diagnosis not present

## 2016-10-24 DIAGNOSIS — G8929 Other chronic pain: Secondary | ICD-10-CM | POA: Diagnosis not present

## 2016-10-30 DIAGNOSIS — R69 Illness, unspecified: Secondary | ICD-10-CM | POA: Diagnosis not present

## 2016-10-30 DIAGNOSIS — Z6823 Body mass index (BMI) 23.0-23.9, adult: Secondary | ICD-10-CM | POA: Diagnosis not present

## 2016-10-30 DIAGNOSIS — J440 Chronic obstructive pulmonary disease with acute lower respiratory infection: Secondary | ICD-10-CM | POA: Diagnosis not present

## 2016-12-04 DIAGNOSIS — Z6823 Body mass index (BMI) 23.0-23.9, adult: Secondary | ICD-10-CM | POA: Diagnosis not present

## 2016-12-04 DIAGNOSIS — J440 Chronic obstructive pulmonary disease with acute lower respiratory infection: Secondary | ICD-10-CM | POA: Diagnosis not present

## 2017-01-16 ENCOUNTER — Encounter (HOSPITAL_COMMUNITY): Payer: Self-pay | Admitting: Emergency Medicine

## 2017-01-16 ENCOUNTER — Emergency Department (HOSPITAL_COMMUNITY)
Admission: EM | Admit: 2017-01-16 | Discharge: 2017-01-17 | Disposition: A | Discharge status: Home / Self Care | Payer: Medicare HMO | Attending: Emergency Medicine | Admitting: Emergency Medicine

## 2017-01-16 ENCOUNTER — Other Ambulatory Visit: Payer: Self-pay

## 2017-01-16 DIAGNOSIS — Z79899 Other long term (current) drug therapy: Secondary | ICD-10-CM | POA: Insufficient documentation

## 2017-01-16 DIAGNOSIS — G8922 Chronic post-thoracotomy pain: Secondary | ICD-10-CM | POA: Diagnosis not present

## 2017-01-16 DIAGNOSIS — F17200 Nicotine dependence, unspecified, uncomplicated: Secondary | ICD-10-CM | POA: Diagnosis not present

## 2017-01-16 DIAGNOSIS — Z5181 Encounter for therapeutic drug level monitoring: Secondary | ICD-10-CM | POA: Diagnosis not present

## 2017-01-16 DIAGNOSIS — M25551 Pain in right hip: Secondary | ICD-10-CM | POA: Diagnosis not present

## 2017-01-16 DIAGNOSIS — E278 Other specified disorders of adrenal gland: Secondary | ICD-10-CM

## 2017-01-16 DIAGNOSIS — E279 Disorder of adrenal gland, unspecified: Secondary | ICD-10-CM | POA: Diagnosis not present

## 2017-01-16 DIAGNOSIS — R109 Unspecified abdominal pain: Secondary | ICD-10-CM | POA: Diagnosis not present

## 2017-01-16 DIAGNOSIS — M25561 Pain in right knee: Secondary | ICD-10-CM | POA: Diagnosis not present

## 2017-01-16 DIAGNOSIS — R1011 Right upper quadrant pain: Secondary | ICD-10-CM | POA: Diagnosis not present

## 2017-01-16 DIAGNOSIS — G894 Chronic pain syndrome: Secondary | ICD-10-CM | POA: Diagnosis not present

## 2017-01-16 DIAGNOSIS — R112 Nausea with vomiting, unspecified: Secondary | ICD-10-CM | POA: Diagnosis not present

## 2017-01-16 DIAGNOSIS — J449 Chronic obstructive pulmonary disease, unspecified: Secondary | ICD-10-CM | POA: Insufficient documentation

## 2017-01-16 DIAGNOSIS — R69 Illness, unspecified: Secondary | ICD-10-CM | POA: Diagnosis not present

## 2017-01-16 DIAGNOSIS — R1012 Left upper quadrant pain: Secondary | ICD-10-CM | POA: Insufficient documentation

## 2017-01-16 DIAGNOSIS — Z79891 Long term (current) use of opiate analgesic: Secondary | ICD-10-CM | POA: Diagnosis not present

## 2017-01-16 DIAGNOSIS — G8921 Chronic pain due to trauma: Secondary | ICD-10-CM | POA: Diagnosis not present

## 2017-01-16 DIAGNOSIS — R062 Wheezing: Secondary | ICD-10-CM

## 2017-01-16 DIAGNOSIS — G8929 Other chronic pain: Secondary | ICD-10-CM | POA: Diagnosis not present

## 2017-01-16 DIAGNOSIS — R079 Chest pain, unspecified: Secondary | ICD-10-CM | POA: Diagnosis present

## 2017-01-16 MED ORDER — IPRATROPIUM-ALBUTEROL 0.5-2.5 (3) MG/3ML IN SOLN
3.0000 mL | Freq: Once | RESPIRATORY_TRACT | Status: AC
  Administered 2017-01-17: 3 mL via RESPIRATORY_TRACT
  Filled 2017-01-16: qty 3

## 2017-01-16 NOTE — ED Triage Notes (Signed)
Pt c/o left chest pain from known lung tumor and states right arm is intermittently going numb x 2 days. Pt is wheezing at this time.

## 2017-01-17 ENCOUNTER — Emergency Department (HOSPITAL_COMMUNITY): Payer: Medicare HMO

## 2017-01-17 ENCOUNTER — Other Ambulatory Visit: Payer: Self-pay

## 2017-01-17 DIAGNOSIS — Z79899 Other long term (current) drug therapy: Secondary | ICD-10-CM | POA: Diagnosis not present

## 2017-01-17 DIAGNOSIS — R079 Chest pain, unspecified: Secondary | ICD-10-CM | POA: Diagnosis not present

## 2017-01-17 DIAGNOSIS — R112 Nausea with vomiting, unspecified: Secondary | ICD-10-CM | POA: Diagnosis not present

## 2017-01-17 DIAGNOSIS — J449 Chronic obstructive pulmonary disease, unspecified: Secondary | ICD-10-CM | POA: Diagnosis not present

## 2017-01-17 DIAGNOSIS — R69 Illness, unspecified: Secondary | ICD-10-CM | POA: Diagnosis not present

## 2017-01-17 DIAGNOSIS — E279 Disorder of adrenal gland, unspecified: Secondary | ICD-10-CM | POA: Diagnosis not present

## 2017-01-17 DIAGNOSIS — R1012 Left upper quadrant pain: Secondary | ICD-10-CM | POA: Diagnosis not present

## 2017-01-17 DIAGNOSIS — R062 Wheezing: Secondary | ICD-10-CM | POA: Diagnosis not present

## 2017-01-17 DIAGNOSIS — R109 Unspecified abdominal pain: Secondary | ICD-10-CM | POA: Diagnosis not present

## 2017-01-17 LAB — I-STAT TROPONIN, ED: TROPONIN I, POC: 0 ng/mL (ref 0.00–0.08)

## 2017-01-17 LAB — CBC WITH DIFFERENTIAL/PLATELET
BASOS ABS: 0 10*3/uL (ref 0.0–0.1)
BASOS PCT: 0 %
EOS ABS: 0.3 10*3/uL (ref 0.0–0.7)
Eosinophils Relative: 4 %
HEMATOCRIT: 44.2 % (ref 39.0–52.0)
HEMOGLOBIN: 14.9 g/dL (ref 13.0–17.0)
Lymphocytes Relative: 29 %
Lymphs Abs: 2.3 10*3/uL (ref 0.7–4.0)
MCH: 31.3 pg (ref 26.0–34.0)
MCHC: 33.7 g/dL (ref 30.0–36.0)
MCV: 92.9 fL (ref 78.0–100.0)
MONOS PCT: 13 %
Monocytes Absolute: 1 10*3/uL (ref 0.1–1.0)
NEUTROS ABS: 4.3 10*3/uL (ref 1.7–7.7)
NEUTROS PCT: 54 %
Platelets: 275 10*3/uL (ref 150–400)
RBC: 4.76 MIL/uL (ref 4.22–5.81)
RDW: 12.5 % (ref 11.5–15.5)
WBC: 7.9 10*3/uL (ref 4.0–10.5)

## 2017-01-17 LAB — BASIC METABOLIC PANEL
ANION GAP: 6 (ref 5–15)
BUN: 19 mg/dL (ref 6–20)
CALCIUM: 9.1 mg/dL (ref 8.9–10.3)
CO2: 30 mmol/L (ref 22–32)
CREATININE: 0.95 mg/dL (ref 0.61–1.24)
Chloride: 100 mmol/L — ABNORMAL LOW (ref 101–111)
Glucose, Bld: 130 mg/dL — ABNORMAL HIGH (ref 65–99)
Potassium: 3.7 mmol/L (ref 3.5–5.1)
SODIUM: 136 mmol/L (ref 135–145)

## 2017-01-17 LAB — URINALYSIS, ROUTINE W REFLEX MICROSCOPIC
BILIRUBIN URINE: NEGATIVE
Glucose, UA: NEGATIVE mg/dL
Hgb urine dipstick: NEGATIVE
Ketones, ur: NEGATIVE mg/dL
LEUKOCYTES UA: NEGATIVE
NITRITE: NEGATIVE
Protein, ur: NEGATIVE mg/dL
pH: 5 (ref 5.0–8.0)

## 2017-01-17 LAB — HEPATIC FUNCTION PANEL
ALBUMIN: 4.2 g/dL (ref 3.5–5.0)
ALT: 21 U/L (ref 17–63)
AST: 24 U/L (ref 15–41)
Alkaline Phosphatase: 98 U/L (ref 38–126)
BILIRUBIN TOTAL: 0.5 mg/dL (ref 0.3–1.2)
Bilirubin, Direct: <0.1 mg/dL — ABNORMAL LOW (ref 0.1–0.5)
Total Protein: 7.3 g/dL (ref 6.5–8.1)

## 2017-01-17 LAB — LIPASE, BLOOD: LIPASE: 24 U/L (ref 11–51)

## 2017-01-17 MED ORDER — IOPAMIDOL (ISOVUE-300) INJECTION 61%
100.0000 mL | Freq: Once | INTRAVENOUS | Status: AC | PRN
  Administered 2017-01-17: 100 mL via INTRAVENOUS

## 2017-01-17 MED ORDER — ONDANSETRON HCL 4 MG/2ML IJ SOLN
4.0000 mg | Freq: Once | INTRAMUSCULAR | Status: AC
  Administered 2017-01-17: 4 mg via INTRAVENOUS
  Filled 2017-01-17: qty 2

## 2017-01-17 MED ORDER — FENTANYL CITRATE (PF) 100 MCG/2ML IJ SOLN
50.0000 ug | Freq: Once | INTRAMUSCULAR | Status: AC
  Administered 2017-01-17: 50 ug via INTRAVENOUS
  Filled 2017-01-17: qty 2

## 2017-01-17 NOTE — Discharge Instructions (Signed)
YOU WILL NEED TO HAVE MRI OF YOUR LEFT ADRENAL GLAND TO FURTHER STUDY THE NODULE WE DISCUSSED YOU CAN HAVE YOUR DOCTORS AT [facility] ARRANGE THIS AS OUTPATIENT ,OR  YOUR PRIMARY DOCTOR   Abdominal (belly) pain can be caused by many things. any cases can be observed and treated at home after initial evaluation in the emergency department. Even though you are being discharged home, abdominal pain can be unpredictable. Therefore, you need a repeated exam if your pain does not resolve, returns, or worsens. Most patients with abdominal pain don't have to be admitted to the hospital or have surgery, but serious problems like appendicitis and gallbladder attacks can start out as nonspecific pain. Many abdominal conditions cannot be diagnosed in one visit, so follow-up evaluations are very important. SEEK IMMEDIATE MEDICAL ATTENTION IF: The pain does not go away or becomes severe, particularly over the next 8-12 hours.  A temperature above 100.34F develops.  Repeated vomiting occurs (multiple episodes).  The pain becomes localized to portions of the abdomen. The right side could possibly be appendicitis. In an adult, the left lower portion of the abdomen could be colitis or diverticulitis.  Blood is being passed in stools or vomit (bright red or black tarry stools).  Return also if you develop chest pain, difficulty breathing, dizziness or fainting, or become confused, poorly responsive, or inconsolable.

## 2017-01-17 NOTE — ED Provider Notes (Signed)
[facility] EMERGENCY DEPARTMENT Provider Note   CSN: [id number] Arrival date & time: 01/16/17  2336     History   Chief Complaint Chief Complaint  Patient presents with  . Chest Pain    HPI Wesley [name] is a 51 y.o. male.  The history is provided by the patient.  Abdominal Pain   This is a new problem. The current episode started yesterday. The problem occurs constantly. The problem has been gradually worsening. The pain is located in the LUQ and LLQ. The pain is severe. Associated symptoms include nausea and vomiting. Pertinent negatives include fever, diarrhea, constipation and dysuria. The symptoms are aggravated by palpation. Relieved by: rest.  Patient with h/o chronic pain, emphysema presents with abdominal pain for past day He reports it is "cramping up" at times and has had vomiting No fever He also reports pain goes into his chest and may "be from the tumor" No constipation  He reports cough/wheezing that is unchanged and not new tonight   Past Medical History:  Diagnosis Date  . Avascular necrosis of femur ([place])   . Back pain   . COPD (chronic obstructive pulmonary disease) ([place])   . Neuropathy   . On home oxygen therapy    "3L prn" (07/19/2016)  . Osteoporosis   . Pulmonary asbestosis ([place])   . 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 Normal coronary arteries 08/02/2016  . Chest pain 07/20/2016  . Acute coronary syndrome ([place]) 07/19/2016  . Abnormal stress test 07/19/2016  . Unstable angina ([place]) 07/19/2016  . Sepsis ([place]) 03/30/2016  . HCAP (healthcare-associated pneumonia) 03/30/2016  . Hypokalemia 01/08/2016  . History of DVT (deep vein thrombosis) 01/08/2016  . OA (osteoarthritis) of hip 09/24/2015  . Community acquired pneumonia 04/14/2015  . Vomiting and diarrhea   . Solitary pulmonary nodule 05/18/2014  . Nausea and vomiting 05/16/2014  .  Bronchitis, acute, with bronchospasm 02/24/2014  . Osteoporosis, unspecified 08/06/2012  . Hip pain 08/06/2012  . Opiate abuse, continuous ([place]) 01/05/2012  . Opiate dependence ([place]) 01/03/2012  . Chronic respiratory failure ([place]) 01/03/2012  . COPD exacerbation ([place]) 01/03/2012  . Pulmonary infection 01/03/2012  . Pneumonia 07/19/2011  . Fever 07/18/2011  . Myalgia 07/18/2011  . Leukocytosis 07/18/2011  . COPD (chronic obstructive pulmonary disease) ([place]) 07/18/2011  . Acute-on-chronic respiratory failure ([place]) 07/18/2011  . Tachycardia 07/18/2011  . Dehydration 07/18/2011  . Chronic pain 07/18/2011  . Tobacco abuse 07/18/2011    Past Surgical History:  Procedure Laterality Date  . BACK SURGERY    . EYE SURGERY     left eye cornea and lens replaced- blind in left eye  . JOINT REPLACEMENT    . LUNG SURGERY         Home Medications    Prior to Admission medications   Medication Sig Start Date End Date Taking? Authorizing Provider  ADVAIR DISKUS 250-50 MCG/DOSE AEPB Inhale 1 puff into the lungs 2 (two) times daily. 06/14/15  Yes [provider]  albuterol (PROVENTIL HFA;VENTOLIN HFA) 108 (90 Base) MCG/ACT inhaler Inhale 2 puffs into the lungs every 4 (four) hours as needed for wheezing or shortness of breath. Shortness of breath 01/11/16  Yes Isaac Bliss, Rayford Halsted, MD  albuterol (PROVENTIL) (2.5 MG/3ML) 0.083% nebulizer solution Take 3 mLs (2.5 mg total) by nebulization every 6 (six) hours as needed for  wheezing or shortness of breath. Patient taking differently: Take 2.5 mg by nebulization every 4 (four) hours as needed for wheezing or shortness of breath.  02/27/14  Yes Kathie Dike, MD  aspirin 81 MG chewable tablet Chew 1 tablet (81 mg total) by mouth daily. 07/20/16  Yes Cheryln Manly, NP  budesonide-formoterol (SYMBICORT) 160-4.5 MCG/ACT inhaler Inhale 3 puffs into the lungs 4 (four) times daily.   Yes [provider]  furosemide (LASIX) 40 MG  tablet Take 40 mg by mouth daily.   Yes [provider]  gabapentin (NEURONTIN) 300 MG capsule Take 600 mg by mouth 3 (three) times daily.    Yes [provider]  montelukast (SINGULAIR) 10 MG tablet Take 1 tablet by mouth daily. 07/07/16  Yes [provider]  naloxone HCl (NARCAN) 4 MG/0.1ML LIQD Place 4 mg into the nose as directed. To prevent overdose   Yes [provider]  oxyCODONE (OXYCONTIN) 30 MG 12 hr tablet Take 30 mg by mouth every 8 (eight) hours.   Yes [provider]  oxycodone (ROXICODONE) 30 MG immediate release tablet Take 30 mg by mouth every 4 (four) hours.   Yes [provider]  OXYGEN Inhale 3 L into the lungs daily as needed.   Yes [provider]  Polyethyl Glycol-Propyl Glycol (SYSTANE) 0.4-0.3 % SOLN Place 1-2 drops into the left eye daily as needed (for dry eye relief).    Yes [provider]  tiotropium (SPIRIVA) 18 MCG inhalation capsule Place 1 capsule (18 mcg total) into inhaler and inhale daily. 07/24/11  Yes Gosrani, Nimish C, MD  nicotine (NICODERM CQ - DOSED IN MG/24 HOURS) 21 mg/24hr patch Place 21 mg onto the skin daily.    [provider]    Family History Family History  Problem Relation Age of Onset  . Heart failure Father   . Diabetes Father   . Diabetes Mother   . Cancer Mother        male cancer    Social History Social History   Tobacco Use  . Smoking status: Current Every Day Smoker    Packs/day: 1.00    Last attempt to quit: 07/05/2011    Years since quitting: 5.5  . Smokeless tobacco: Never Used  Substance Use Topics  . Alcohol use: No  . Drug use: No    Comment: takes these medications as prescribed by Physician for pain!     Allergies   Ambien [zolpidem tartrate]; Melatonin; and Zolpidem   Review of Systems Review of Systems  Constitutional: Negative for fever.  Respiratory: Positive for cough and wheezing.   Gastrointestinal: Positive for  abdominal pain, nausea and vomiting. Negative for constipation and diarrhea.  Genitourinary: Negative for dysuria.  All other systems reviewed and are negative.    Physical Exam Updated Vital Signs BP (!) 101/55   Pulse 86   Temp 97.9 F (36.6 C)   Resp 13   Ht 1.854 m (6\' 1" )   Wt 79.8 kg (176 lb)   SpO2 98%   BMI 23.22 kg/m   Physical Exam CONSTITUTIONAL: Well developed/well nourished HEAD: Normocephalic/atraumatic EYES: EOMI/PERRL ENMT: Mucous membranes moist NECK: supple no meningeal signs SPINE/BACK:entire spine nontender CV: S1/S2 noted, no murmurs/rubs/gallops noted Chest - diffuse left sided chest wall tenderness, no bruising/crepitus LUNGS: wheezing bilaterally, no apparent distress ABDOMEN: soft, moderate LUQ/LLQ tenderness, no rebound or guarding, bowel sounds noted throughout abdomen GU:no cva tenderness NEURO: Pt is awake/alert/appropriate, moves all extremitiesx4.  No facial droop.  EXTREMITIES: pulses normal/equal, full ROM SKIN: warm, color normal PSYCH: anxious  ED Treatments / Results  Labs (all labs ordered are listed, but only abnormal results are displayed) Labs Reviewed  BASIC METABOLIC PANEL - Abnormal; Notable for the following components:      Result Value   Chloride 100 (*)    Glucose, Bld 130 (*)    All other components within normal limits  HEPATIC FUNCTION PANEL - Abnormal; Notable for the following components:   Bilirubin, Direct <0.1 (*)    All other components within normal limits  URINALYSIS, ROUTINE W REFLEX MICROSCOPIC - Abnormal; Notable for the following components:   Specific Gravity, Urine >1.046 (*)    All other components within normal limits  CBC WITH DIFFERENTIAL/PLATELET  LIPASE, BLOOD  I-STAT TROPONIN, ED    EKG  EKG Interpretation  Date/Time:  Tuesday January 16 2017 23:48:50 EST Ventricular Rate:  88 PR Interval:    QRS Duration: 89 QT Interval:  365 QTC Calculation: 445 R Axis:   92 Text  Interpretation:  Sinus rhythm Borderline right axis deviation Abnormal R-wave progression, late transition Nonspecific T abnormalities, lateral leads No significant change since last tracing Confirmed by Ripley Fraise [phone]) on 01/16/2017 11:54:08 PM       Radiology Dg Chest 2 View  Result Date: 01/17/2017 CLINICAL DATA:  51 year old male with chest pain. EXAM: CHEST  2 VIEW COMPARISON:  Chest radiograph dated 08/16/2016 and CT dated 07/28/2016 FINDINGS: There is severe emphysema. An area of increased hazy density in the peripheral aspect of the mid lung field on the left corresponds to the scarring seen on the prior CT. There is no consolidative changes. No pleural effusion or pneumothorax. Surgical material noted in the left upper lung field. The cardiac silhouette is within normal limits. No acute osseous pathology. IMPRESSION: 1. No acute cardiopulmonary process. 2. Severe emphysema.  No pneumothorax. Electronically Signed   By: Anner Crete M.D.   On: 01/17/2017 00:47   Ct Abdomen Pelvis W Contrast  Result Date: 01/17/2017 CLINICAL DATA:  Acute onset of left upper abdominal pain. Wheezing and right arm numbness. EXAM: CT ABDOMEN AND PELVIS WITH CONTRAST TECHNIQUE: Multidetector CT imaging of the abdomen and pelvis was performed using the standard protocol following bolus administration of intravenous contrast. CONTRAST:  112mL ISOVUE-300 IOPAMIDOL (ISOVUE-300) INJECTION 61% COMPARISON:  CT of the abdomen and pelvis performed 10/31/2004 FINDINGS: Lower chest: The visualized lung bases are grossly clear. The visualized portions of the mediastinum are unremarkable. Hepatobiliary: The liver is unremarkable in appearance. The gallbladder is unremarkable in appearance. The common bile duct remains normal in caliber. Pancreas: The pancreas is within normal limits. Spleen: The spleen is unremarkable in appearance. Adrenals/Urinary Tract: A 1.5 cm nodule is noted at the left adrenal gland. The  right adrenal gland is unremarkable. The kidneys are within normal limits. There is no evidence of hydronephrosis. No renal or ureteral stones are identified. No perinephric stranding is seen. Stomach/Bowel: The stomach is unremarkable in appearance. The small bowel is within normal limits. The appendix is normal in caliber, without evidence of appendicitis. The colon is unremarkable in appearance. Vascular/Lymphatic: Scattered calcification is seen along the abdominal aorta and its branches. The abdominal aorta is otherwise grossly unremarkable. The inferior vena cava is grossly unremarkable. No retroperitoneal lymphadenopathy is seen. No pelvic sidewall lymphadenopathy is identified. Reproductive: The bladder is mildly distended and grossly unremarkable. The prostate remains normal in size, with scattered calcification. Other: No additional soft tissue abnormalities are seen. Musculoskeletal:  No acute osseous abnormalities are identified. Bilateral hip arthroplasties are incompletely imaged but appear grossly unremarkable. The visualized musculature is unremarkable in appearance. IMPRESSION: 1. No acute abnormality seen to explain the patient's symptoms. 2. 1.5 cm nodule at the left adrenal gland. Would correlate with adrenal labs. Adrenal protocol MRI or CT could be considered for further evaluation, on an elective nonemergent basis. 3.  Aortic Atherosclerosis (ICD10-I70.0). Electronically Signed   By: Garald Balding M.D.   On: 01/17/2017 01:48    Procedures Procedures  Medications Ordered in ED Medications  ipratropium-albuterol (DUONEB) 0.5-2.5 (3) MG/3ML nebulizer solution 3 mL (3 mLs Nebulization Given 01/17/17 0037)  iopamidol (ISOVUE-300) 61 % injection 100 mL (100 mLs Intravenous Contrast Given 01/17/17 0110)  fentaNYL (SUBLIMAZE) injection 50 mcg (50 mcg Intravenous Given 01/17/17 0105)  ondansetron (ZOFRAN) injection 4 mg (4 mg Intravenous Given 01/17/17 0105)     Initial Impression /  Assessment and Plan / ED Course  I have reviewed the triage vital signs and the nursing notes.  Pertinent labs & imaging results that were available during my care of the patient were reviewed by me and considered in my medical decision making (see chart for details).    1:25 AM Pt arrived with multiple complaints ,but his main issue appears to be new onset LUQ/LLQ abdominal pain Will get CT imaging He mentions "tumor" but I don't see any recent evidence of this in records 2:38 AM CT SCAN NEGATIVE FOR ACUTE PROCESS NODULE ON ADRENAL GLAND NOTED, ADVISED PATIENT FOLLOWUP AND NEED FOR OUTPATIENT MRI HE UNDERSTANDS NEED FOR MRI  PT WELL APPEARING, AMBULATORY WITHOUT DISTRESS NO SIGNS OF ACUTE ABDOMINAL EMERGENCY CHEST WALL IS TENDER TO PALPATION WITH NEGATIVE CXR, EKG UNCHANGED AND RECENT NEGATIVE CARDIAC CATH DOUBT PE AT THIS TIME   HE KEEPS REPORTING HE HAS TUMOR IN CHEST  PER RECORDS FROM [facility] HE HAD VATS/BULLECTOMY IN CHEST AND HE HAS LOCALIZED HERNIA WHEN HE COUGHS IN THAT AREA.  HE TELLS ME THEY THINK THERE IS TUMOR IN THAT AREA AND HE IS SUPPOSED TO F/U WITH ONCOLOGY AT [facility] NEXT MONTH   Final Clinical Impressions(s) / ED Diagnoses   Final diagnoses:  Left upper quadrant pain  Wheezing  Adrenal nodule [facility])    ED Discharge Orders    None       Ripley Fraise, MD 01/17/17 [phone]

## 2017-01-19 DIAGNOSIS — J441 Chronic obstructive pulmonary disease with (acute) exacerbation: Secondary | ICD-10-CM | POA: Diagnosis not present

## 2017-01-19 DIAGNOSIS — R1084 Generalized abdominal pain: Secondary | ICD-10-CM | POA: Diagnosis not present

## 2017-01-19 DIAGNOSIS — Z6825 Body mass index (BMI) 25.0-25.9, adult: Secondary | ICD-10-CM | POA: Diagnosis not present

## 2017-02-28 DIAGNOSIS — Z5181 Encounter for therapeutic drug level monitoring: Secondary | ICD-10-CM | POA: Diagnosis not present

## 2017-04-10 DIAGNOSIS — G8922 Chronic post-thoracotomy pain: Secondary | ICD-10-CM | POA: Diagnosis not present

## 2017-04-10 DIAGNOSIS — Z79899 Other long term (current) drug therapy: Secondary | ICD-10-CM | POA: Diagnosis not present

## 2017-04-10 DIAGNOSIS — Z5181 Encounter for therapeutic drug level monitoring: Secondary | ICD-10-CM | POA: Diagnosis not present

## 2017-04-10 DIAGNOSIS — G894 Chronic pain syndrome: Secondary | ICD-10-CM | POA: Diagnosis not present

## 2017-04-10 DIAGNOSIS — M25561 Pain in right knee: Secondary | ICD-10-CM | POA: Diagnosis not present

## 2017-04-10 DIAGNOSIS — G8929 Other chronic pain: Secondary | ICD-10-CM | POA: Diagnosis not present

## 2017-04-10 DIAGNOSIS — M25551 Pain in right hip: Secondary | ICD-10-CM | POA: Diagnosis not present

## 2017-04-10 DIAGNOSIS — Z79891 Long term (current) use of opiate analgesic: Secondary | ICD-10-CM | POA: Diagnosis not present

## 2017-05-17 DIAGNOSIS — Z9981 Dependence on supplemental oxygen: Secondary | ICD-10-CM | POA: Diagnosis not present

## 2017-05-17 DIAGNOSIS — Z825 Family history of asthma and other chronic lower respiratory diseases: Secondary | ICD-10-CM | POA: Diagnosis not present

## 2017-05-17 DIAGNOSIS — J9612 Chronic respiratory failure with hypercapnia: Secondary | ICD-10-CM | POA: Diagnosis not present

## 2017-05-17 DIAGNOSIS — R269 Unspecified abnormalities of gait and mobility: Secondary | ICD-10-CM | POA: Diagnosis not present

## 2017-05-17 DIAGNOSIS — G8929 Other chronic pain: Secondary | ICD-10-CM | POA: Diagnosis not present

## 2017-05-17 DIAGNOSIS — Z888 Allergy status to other drugs, medicaments and biological substances status: Secondary | ICD-10-CM | POA: Diagnosis not present

## 2017-05-17 DIAGNOSIS — M545 Low back pain: Secondary | ICD-10-CM | POA: Diagnosis not present

## 2017-05-17 DIAGNOSIS — I279 Pulmonary heart disease, unspecified: Secondary | ICD-10-CM | POA: Diagnosis not present

## 2017-05-17 DIAGNOSIS — Z79891 Long term (current) use of opiate analgesic: Secondary | ICD-10-CM | POA: Diagnosis not present

## 2017-05-17 DIAGNOSIS — R69 Illness, unspecified: Secondary | ICD-10-CM | POA: Diagnosis not present

## 2017-05-17 DIAGNOSIS — Z23 Encounter for immunization: Secondary | ICD-10-CM | POA: Diagnosis not present

## 2017-05-17 DIAGNOSIS — J441 Chronic obstructive pulmonary disease with (acute) exacerbation: Secondary | ICD-10-CM | POA: Diagnosis not present

## 2017-05-17 DIAGNOSIS — Z79899 Other long term (current) drug therapy: Secondary | ICD-10-CM | POA: Diagnosis not present

## 2017-05-17 DIAGNOSIS — J449 Chronic obstructive pulmonary disease, unspecified: Secondary | ICD-10-CM | POA: Diagnosis not present

## 2017-05-29 DIAGNOSIS — R69 Illness, unspecified: Secondary | ICD-10-CM | POA: Diagnosis not present

## 2017-05-29 DIAGNOSIS — J449 Chronic obstructive pulmonary disease, unspecified: Secondary | ICD-10-CM | POA: Diagnosis not present

## 2017-05-29 DIAGNOSIS — Z6824 Body mass index (BMI) 24.0-24.9, adult: Secondary | ICD-10-CM | POA: Diagnosis not present

## 2017-05-29 DIAGNOSIS — R040 Epistaxis: Secondary | ICD-10-CM | POA: Diagnosis not present

## 2017-06-21 DIAGNOSIS — I279 Pulmonary heart disease, unspecified: Secondary | ICD-10-CM | POA: Diagnosis not present

## 2017-06-21 DIAGNOSIS — R269 Unspecified abnormalities of gait and mobility: Secondary | ICD-10-CM | POA: Diagnosis not present

## 2017-06-21 DIAGNOSIS — J441 Chronic obstructive pulmonary disease with (acute) exacerbation: Secondary | ICD-10-CM | POA: Diagnosis not present

## 2017-07-04 DIAGNOSIS — G8922 Chronic post-thoracotomy pain: Secondary | ICD-10-CM | POA: Diagnosis not present

## 2017-07-04 DIAGNOSIS — G894 Chronic pain syndrome: Secondary | ICD-10-CM | POA: Diagnosis not present

## 2017-07-04 DIAGNOSIS — M25551 Pain in right hip: Secondary | ICD-10-CM | POA: Diagnosis not present

## 2017-07-04 DIAGNOSIS — Z5181 Encounter for therapeutic drug level monitoring: Secondary | ICD-10-CM | POA: Diagnosis not present

## 2017-07-04 DIAGNOSIS — G8929 Other chronic pain: Secondary | ICD-10-CM | POA: Diagnosis not present

## 2017-07-04 DIAGNOSIS — Z79891 Long term (current) use of opiate analgesic: Secondary | ICD-10-CM | POA: Diagnosis not present

## 2017-07-04 DIAGNOSIS — M25561 Pain in right knee: Secondary | ICD-10-CM | POA: Diagnosis not present

## 2017-07-04 DIAGNOSIS — Z79899 Other long term (current) drug therapy: Secondary | ICD-10-CM | POA: Diagnosis not present

## 2017-07-21 DIAGNOSIS — J441 Chronic obstructive pulmonary disease with (acute) exacerbation: Secondary | ICD-10-CM | POA: Diagnosis not present

## 2017-07-21 DIAGNOSIS — R269 Unspecified abnormalities of gait and mobility: Secondary | ICD-10-CM | POA: Diagnosis not present

## 2017-07-21 DIAGNOSIS — I279 Pulmonary heart disease, unspecified: Secondary | ICD-10-CM | POA: Diagnosis not present

## 2017-07-30 DIAGNOSIS — I279 Pulmonary heart disease, unspecified: Secondary | ICD-10-CM | POA: Diagnosis not present

## 2017-07-30 DIAGNOSIS — R269 Unspecified abnormalities of gait and mobility: Secondary | ICD-10-CM | POA: Diagnosis not present

## 2017-07-30 DIAGNOSIS — J441 Chronic obstructive pulmonary disease with (acute) exacerbation: Secondary | ICD-10-CM | POA: Diagnosis not present

## 2017-08-21 DIAGNOSIS — R269 Unspecified abnormalities of gait and mobility: Secondary | ICD-10-CM | POA: Diagnosis not present

## 2017-08-21 DIAGNOSIS — J441 Chronic obstructive pulmonary disease with (acute) exacerbation: Secondary | ICD-10-CM | POA: Diagnosis not present

## 2017-08-21 DIAGNOSIS — I279 Pulmonary heart disease, unspecified: Secondary | ICD-10-CM | POA: Diagnosis not present

## 2017-08-29 DIAGNOSIS — G8929 Other chronic pain: Secondary | ICD-10-CM | POA: Diagnosis not present

## 2017-08-29 DIAGNOSIS — M25551 Pain in right hip: Secondary | ICD-10-CM | POA: Diagnosis not present

## 2017-08-29 DIAGNOSIS — Z79899 Other long term (current) drug therapy: Secondary | ICD-10-CM | POA: Diagnosis not present

## 2017-08-29 DIAGNOSIS — Z79891 Long term (current) use of opiate analgesic: Secondary | ICD-10-CM | POA: Diagnosis not present

## 2017-08-29 DIAGNOSIS — M25561 Pain in right knee: Secondary | ICD-10-CM | POA: Diagnosis not present

## 2017-08-29 DIAGNOSIS — G894 Chronic pain syndrome: Secondary | ICD-10-CM | POA: Diagnosis not present

## 2017-08-29 DIAGNOSIS — Z5181 Encounter for therapeutic drug level monitoring: Secondary | ICD-10-CM | POA: Diagnosis not present

## 2017-08-29 DIAGNOSIS — G8922 Chronic post-thoracotomy pain: Secondary | ICD-10-CM | POA: Diagnosis not present

## 2017-08-30 DIAGNOSIS — J441 Chronic obstructive pulmonary disease with (acute) exacerbation: Secondary | ICD-10-CM | POA: Diagnosis not present

## 2017-08-30 DIAGNOSIS — R269 Unspecified abnormalities of gait and mobility: Secondary | ICD-10-CM | POA: Diagnosis not present

## 2017-08-30 DIAGNOSIS — I279 Pulmonary heart disease, unspecified: Secondary | ICD-10-CM | POA: Diagnosis not present

## 2017-09-20 DIAGNOSIS — J441 Chronic obstructive pulmonary disease with (acute) exacerbation: Secondary | ICD-10-CM | POA: Diagnosis not present

## 2017-09-20 DIAGNOSIS — R269 Unspecified abnormalities of gait and mobility: Secondary | ICD-10-CM | POA: Diagnosis not present

## 2017-09-20 DIAGNOSIS — I279 Pulmonary heart disease, unspecified: Secondary | ICD-10-CM | POA: Diagnosis not present

## 2017-09-29 DIAGNOSIS — I279 Pulmonary heart disease, unspecified: Secondary | ICD-10-CM | POA: Diagnosis not present

## 2017-09-29 DIAGNOSIS — R269 Unspecified abnormalities of gait and mobility: Secondary | ICD-10-CM | POA: Diagnosis not present

## 2017-09-29 DIAGNOSIS — J441 Chronic obstructive pulmonary disease with (acute) exacerbation: Secondary | ICD-10-CM | POA: Diagnosis not present

## 2017-10-03 DIAGNOSIS — G894 Chronic pain syndrome: Secondary | ICD-10-CM | POA: Diagnosis not present

## 2017-10-03 DIAGNOSIS — G8921 Chronic pain due to trauma: Secondary | ICD-10-CM | POA: Diagnosis not present

## 2017-10-03 DIAGNOSIS — Z79891 Long term (current) use of opiate analgesic: Secondary | ICD-10-CM | POA: Diagnosis not present

## 2017-10-03 DIAGNOSIS — Z5181 Encounter for therapeutic drug level monitoring: Secondary | ICD-10-CM | POA: Diagnosis not present

## 2017-10-03 DIAGNOSIS — M25551 Pain in right hip: Secondary | ICD-10-CM | POA: Diagnosis not present

## 2017-10-03 DIAGNOSIS — G8929 Other chronic pain: Secondary | ICD-10-CM | POA: Diagnosis not present

## 2017-10-03 DIAGNOSIS — G8922 Chronic post-thoracotomy pain: Secondary | ICD-10-CM | POA: Diagnosis not present

## 2017-10-03 DIAGNOSIS — Z79899 Other long term (current) drug therapy: Secondary | ICD-10-CM | POA: Diagnosis not present

## 2017-10-03 DIAGNOSIS — M25561 Pain in right knee: Secondary | ICD-10-CM | POA: Diagnosis not present

## 2017-10-21 DIAGNOSIS — J441 Chronic obstructive pulmonary disease with (acute) exacerbation: Secondary | ICD-10-CM | POA: Diagnosis not present

## 2017-10-21 DIAGNOSIS — R269 Unspecified abnormalities of gait and mobility: Secondary | ICD-10-CM | POA: Diagnosis not present

## 2017-10-21 DIAGNOSIS — I279 Pulmonary heart disease, unspecified: Secondary | ICD-10-CM | POA: Diagnosis not present

## 2017-10-30 DIAGNOSIS — I279 Pulmonary heart disease, unspecified: Secondary | ICD-10-CM | POA: Diagnosis not present

## 2017-10-30 DIAGNOSIS — J441 Chronic obstructive pulmonary disease with (acute) exacerbation: Secondary | ICD-10-CM | POA: Diagnosis not present

## 2017-10-30 DIAGNOSIS — R269 Unspecified abnormalities of gait and mobility: Secondary | ICD-10-CM | POA: Diagnosis not present

## 2017-11-14 DIAGNOSIS — Z5181 Encounter for therapeutic drug level monitoring: Secondary | ICD-10-CM | POA: Diagnosis not present

## 2017-11-14 DIAGNOSIS — Z79891 Long term (current) use of opiate analgesic: Secondary | ICD-10-CM | POA: Diagnosis not present

## 2017-11-14 DIAGNOSIS — M25551 Pain in right hip: Secondary | ICD-10-CM | POA: Diagnosis not present

## 2017-11-14 DIAGNOSIS — M25561 Pain in right knee: Secondary | ICD-10-CM | POA: Diagnosis not present

## 2017-11-14 DIAGNOSIS — Z79899 Other long term (current) drug therapy: Secondary | ICD-10-CM | POA: Diagnosis not present

## 2017-11-14 DIAGNOSIS — G8922 Chronic post-thoracotomy pain: Secondary | ICD-10-CM | POA: Diagnosis not present

## 2017-11-14 DIAGNOSIS — G8929 Other chronic pain: Secondary | ICD-10-CM | POA: Diagnosis not present

## 2017-11-14 DIAGNOSIS — G894 Chronic pain syndrome: Secondary | ICD-10-CM | POA: Diagnosis not present

## 2017-11-21 DIAGNOSIS — I279 Pulmonary heart disease, unspecified: Secondary | ICD-10-CM | POA: Diagnosis not present

## 2017-11-21 DIAGNOSIS — J441 Chronic obstructive pulmonary disease with (acute) exacerbation: Secondary | ICD-10-CM | POA: Diagnosis not present

## 2017-11-21 DIAGNOSIS — R269 Unspecified abnormalities of gait and mobility: Secondary | ICD-10-CM | POA: Diagnosis not present

## 2017-11-30 DIAGNOSIS — J441 Chronic obstructive pulmonary disease with (acute) exacerbation: Secondary | ICD-10-CM | POA: Diagnosis not present

## 2017-11-30 DIAGNOSIS — I279 Pulmonary heart disease, unspecified: Secondary | ICD-10-CM | POA: Diagnosis not present

## 2017-11-30 DIAGNOSIS — R269 Unspecified abnormalities of gait and mobility: Secondary | ICD-10-CM | POA: Diagnosis not present

## 2017-12-17 DIAGNOSIS — Z6824 Body mass index (BMI) 24.0-24.9, adult: Secondary | ICD-10-CM | POA: Diagnosis not present

## 2017-12-17 DIAGNOSIS — J449 Chronic obstructive pulmonary disease, unspecified: Secondary | ICD-10-CM | POA: Diagnosis not present

## 2017-12-17 DIAGNOSIS — Z0000 Encounter for general adult medical examination without abnormal findings: Secondary | ICD-10-CM | POA: Diagnosis not present

## 2017-12-17 DIAGNOSIS — Z6822 Body mass index (BMI) 22.0-22.9, adult: Secondary | ICD-10-CM | POA: Diagnosis not present

## 2017-12-17 DIAGNOSIS — Z125 Encounter for screening for malignant neoplasm of prostate: Secondary | ICD-10-CM | POA: Diagnosis not present

## 2017-12-17 DIAGNOSIS — R69 Illness, unspecified: Secondary | ICD-10-CM | POA: Diagnosis not present

## 2017-12-17 DIAGNOSIS — Z1389 Encounter for screening for other disorder: Secondary | ICD-10-CM | POA: Diagnosis not present

## 2017-12-17 DIAGNOSIS — Z131 Encounter for screening for diabetes mellitus: Secondary | ICD-10-CM | POA: Diagnosis not present

## 2017-12-17 DIAGNOSIS — K210 Gastro-esophageal reflux disease with esophagitis: Secondary | ICD-10-CM | POA: Diagnosis not present

## 2017-12-17 DIAGNOSIS — R040 Epistaxis: Secondary | ICD-10-CM | POA: Diagnosis not present

## 2017-12-21 DIAGNOSIS — I279 Pulmonary heart disease, unspecified: Secondary | ICD-10-CM | POA: Diagnosis not present

## 2017-12-21 DIAGNOSIS — J441 Chronic obstructive pulmonary disease with (acute) exacerbation: Secondary | ICD-10-CM | POA: Diagnosis not present

## 2017-12-21 DIAGNOSIS — R269 Unspecified abnormalities of gait and mobility: Secondary | ICD-10-CM | POA: Diagnosis not present

## 2018-01-07 DIAGNOSIS — G8929 Other chronic pain: Secondary | ICD-10-CM | POA: Diagnosis not present

## 2018-01-07 DIAGNOSIS — R32 Unspecified urinary incontinence: Secondary | ICD-10-CM | POA: Diagnosis not present

## 2018-01-07 DIAGNOSIS — M1990 Unspecified osteoarthritis, unspecified site: Secondary | ICD-10-CM | POA: Diagnosis not present

## 2018-01-07 DIAGNOSIS — Z791 Long term (current) use of non-steroidal anti-inflammatories (NSAID): Secondary | ICD-10-CM | POA: Diagnosis not present

## 2018-01-07 DIAGNOSIS — R1084 Generalized abdominal pain: Secondary | ICD-10-CM | POA: Diagnosis not present

## 2018-01-07 DIAGNOSIS — J988 Other specified respiratory disorders: Secondary | ICD-10-CM | POA: Diagnosis not present

## 2018-01-07 DIAGNOSIS — J439 Emphysema, unspecified: Secondary | ICD-10-CM | POA: Diagnosis not present

## 2018-01-07 DIAGNOSIS — G629 Polyneuropathy, unspecified: Secondary | ICD-10-CM | POA: Diagnosis not present

## 2018-01-07 DIAGNOSIS — J9610 Chronic respiratory failure, unspecified whether with hypoxia or hypercapnia: Secondary | ICD-10-CM | POA: Diagnosis not present

## 2018-01-07 DIAGNOSIS — Z7951 Long term (current) use of inhaled steroids: Secondary | ICD-10-CM | POA: Diagnosis not present

## 2018-01-07 DIAGNOSIS — R69 Illness, unspecified: Secondary | ICD-10-CM | POA: Diagnosis not present

## 2018-01-09 DIAGNOSIS — M25561 Pain in right knee: Secondary | ICD-10-CM | POA: Diagnosis not present

## 2018-01-09 DIAGNOSIS — G8922 Chronic post-thoracotomy pain: Secondary | ICD-10-CM | POA: Diagnosis not present

## 2018-01-09 DIAGNOSIS — G894 Chronic pain syndrome: Secondary | ICD-10-CM | POA: Diagnosis not present

## 2018-01-09 DIAGNOSIS — M25551 Pain in right hip: Secondary | ICD-10-CM | POA: Diagnosis not present

## 2018-01-09 DIAGNOSIS — Z79899 Other long term (current) drug therapy: Secondary | ICD-10-CM | POA: Diagnosis not present

## 2018-01-09 DIAGNOSIS — G8929 Other chronic pain: Secondary | ICD-10-CM | POA: Diagnosis not present

## 2018-01-09 DIAGNOSIS — Z79891 Long term (current) use of opiate analgesic: Secondary | ICD-10-CM | POA: Diagnosis not present

## 2018-01-09 DIAGNOSIS — Z5181 Encounter for therapeutic drug level monitoring: Secondary | ICD-10-CM | POA: Diagnosis not present

## 2018-01-21 DIAGNOSIS — R269 Unspecified abnormalities of gait and mobility: Secondary | ICD-10-CM | POA: Diagnosis not present

## 2018-01-21 DIAGNOSIS — J441 Chronic obstructive pulmonary disease with (acute) exacerbation: Secondary | ICD-10-CM | POA: Diagnosis not present

## 2018-01-21 DIAGNOSIS — I279 Pulmonary heart disease, unspecified: Secondary | ICD-10-CM | POA: Diagnosis not present

## 2018-02-15 IMAGING — DX DG CHEST 2V
2 series · 2 of 2 positions shown · non-contrast
Comparison: 04/01/2016

CLINICAL DATA: Community acquired pneumonia

EXAM:
CHEST  2 VIEW

[chest pa]
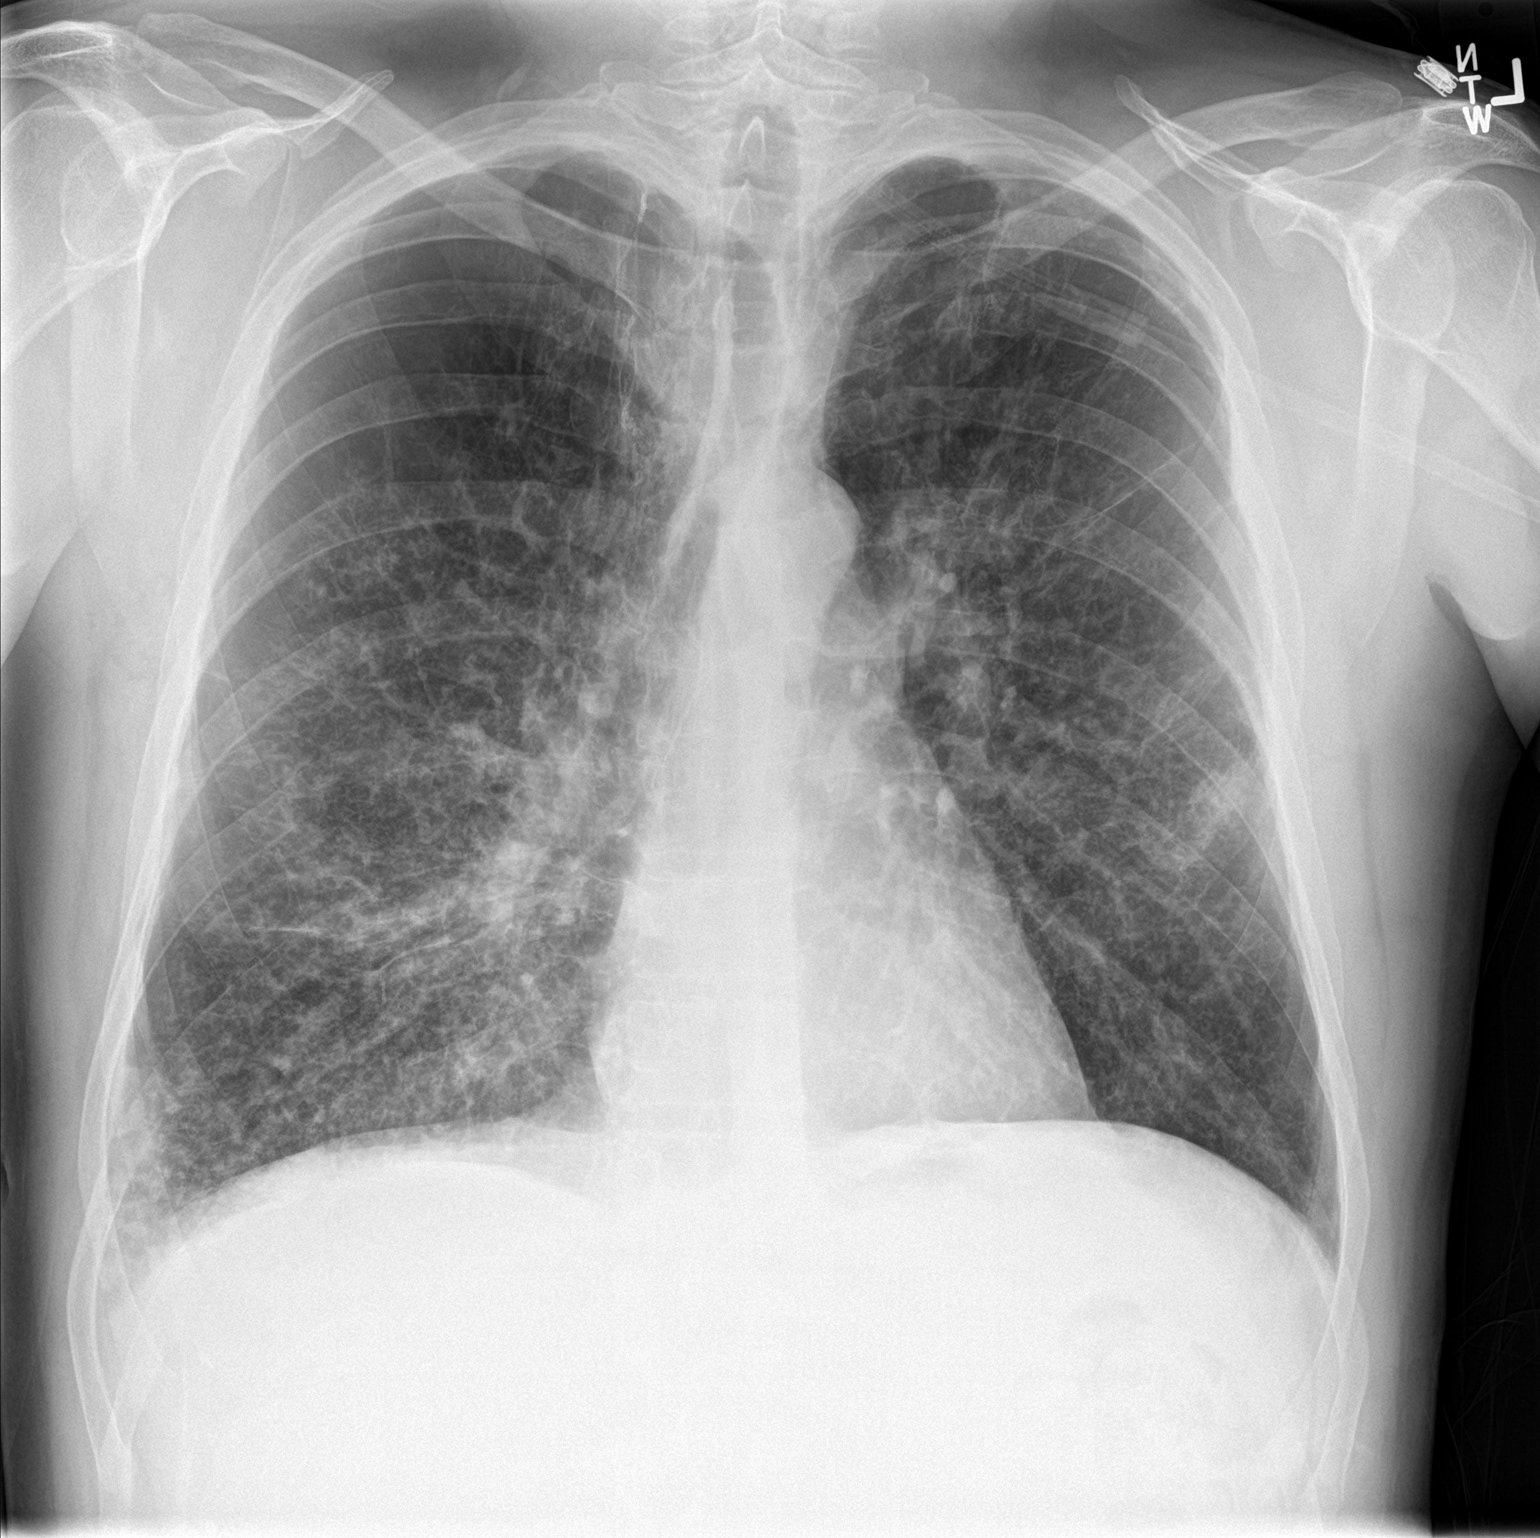

[chest lat]
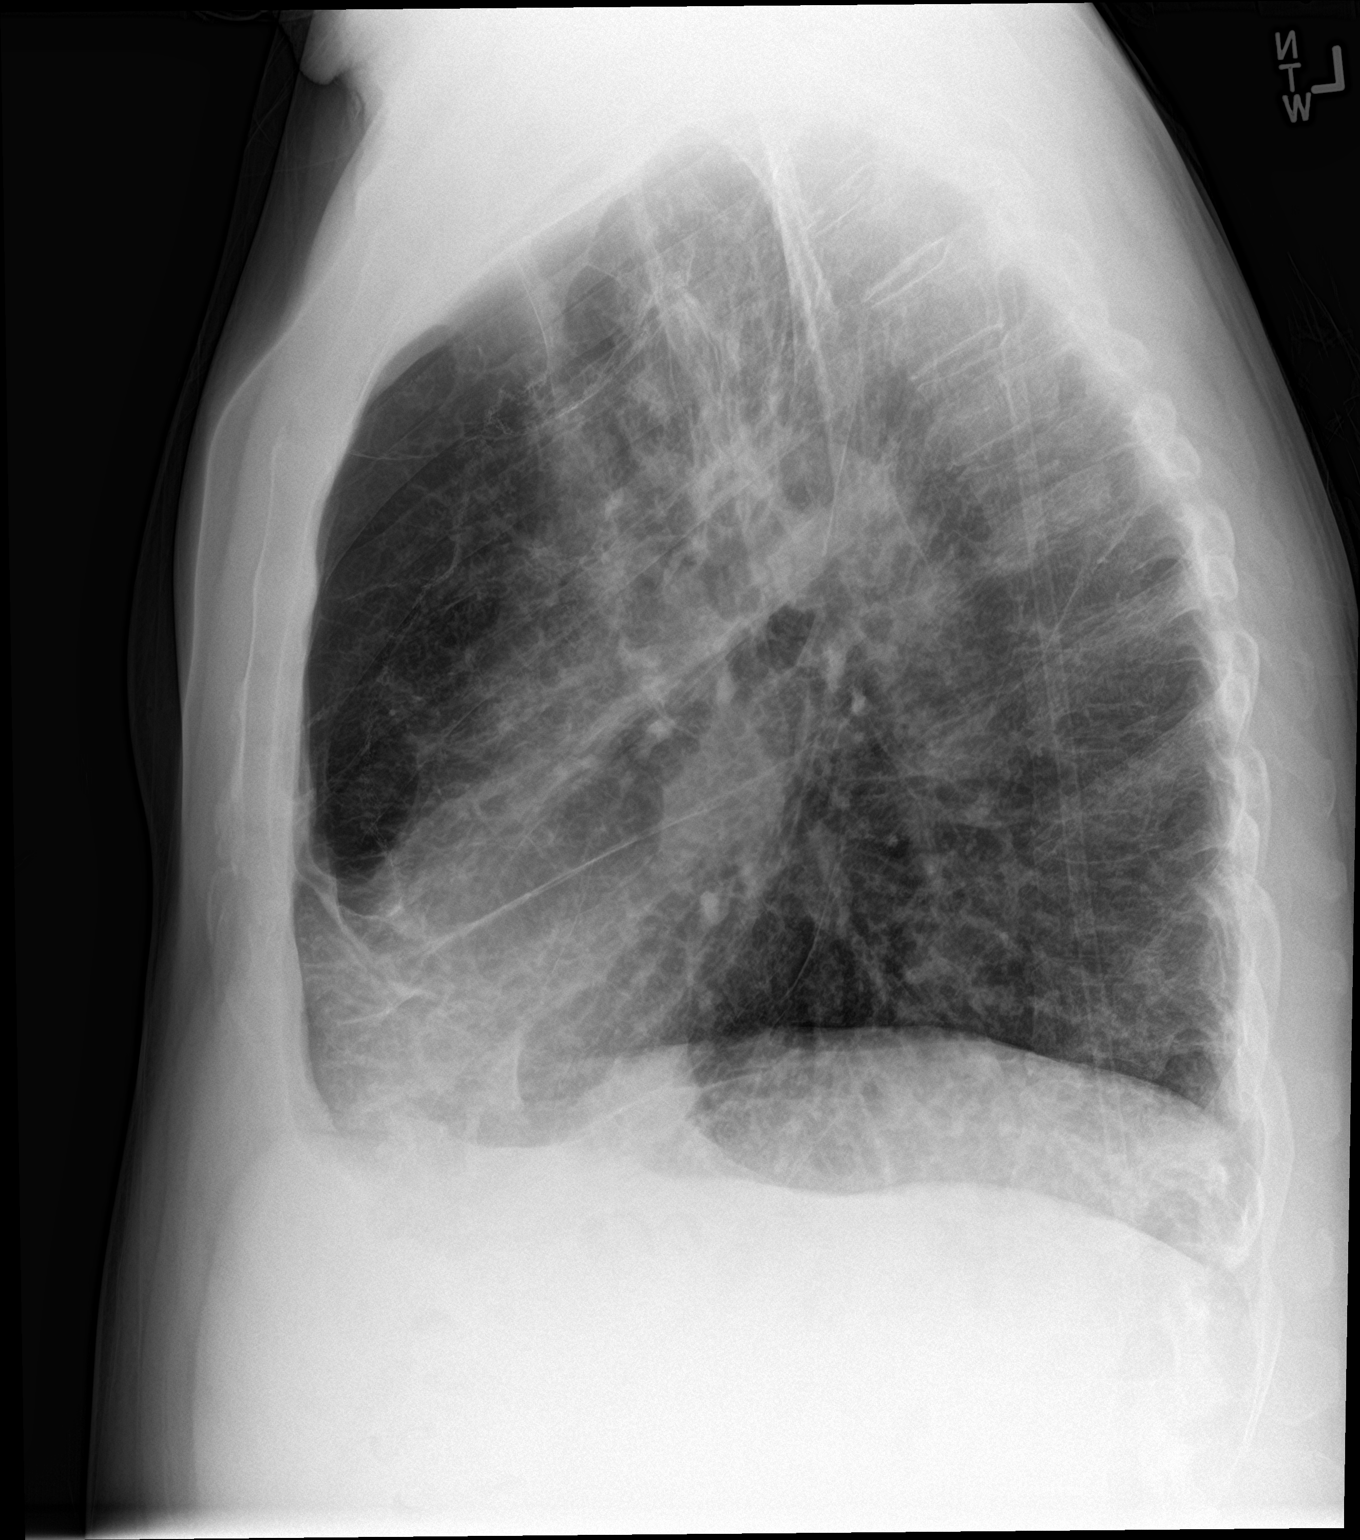

[2 of 2 positions shown; findings below may reference images not displayed]

FINDINGS: Patchy airspace opacities in both lungs, right greater than left
compatible with pneumonia. Underlying COPD. Heart is normal size. No
effusions.
IMPRESSION: Patchy bilateral airspace disease compatible with multifocal
pneumonia.

COPD.

## 2018-02-15 IMAGING — CT CT HEAD W/O CM
3 series · 15 of 47 positions shown, 18 images · non-contrast
Comparison: 01/12/2009

CLINICAL DATA: Encephalopathy.

EXAM:
CT HEAD WITHOUT CONTRAST
TECHNIQUE: Contiguous axial images were obtained from the base of the skull
through the vertex without intravenous contrast.

[Series 2: head wo · axial · 0.47mm/px · z∈[+77,+212]mm · 9 of 33 slices shown, 12 images]
[im 3/33  brain]
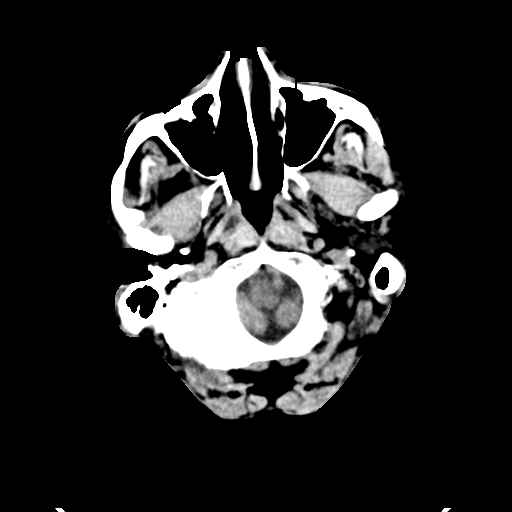
[im 3/33  bone]
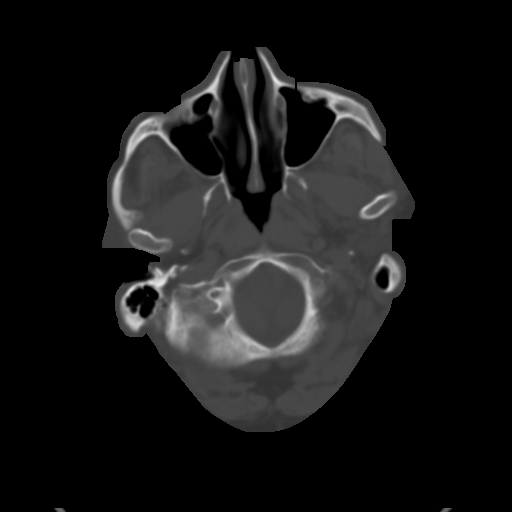
[im 6/33  brain]
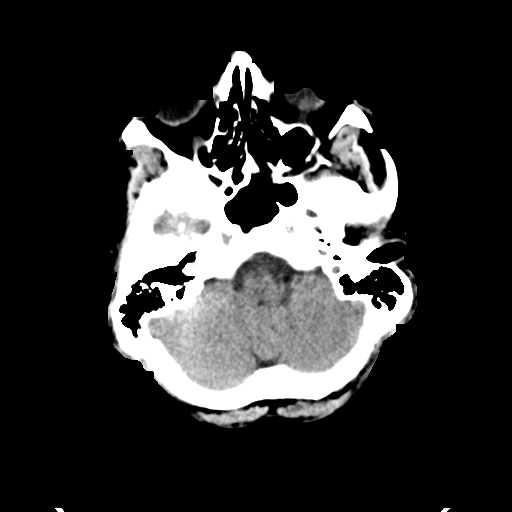
[im 9/33  brain]
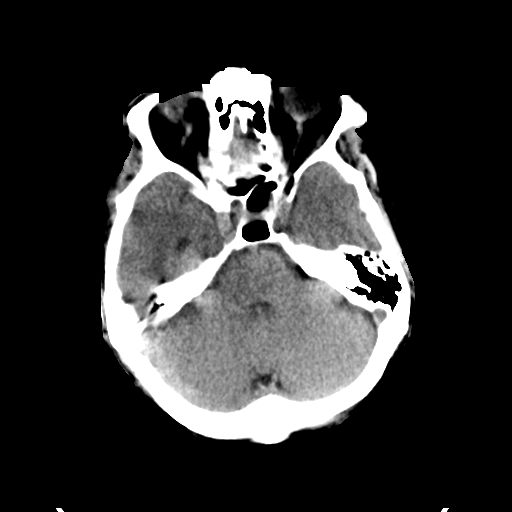
[im 13/33  brain]
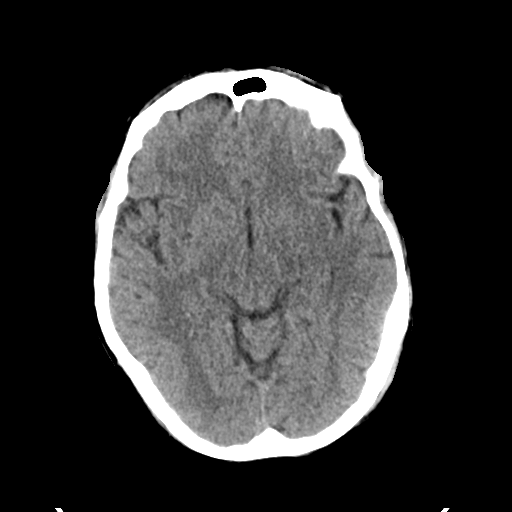
[im 17/33  brain]
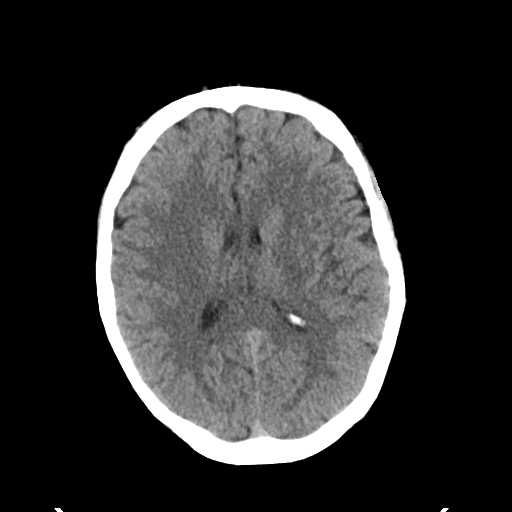
[im 17/33  bone]
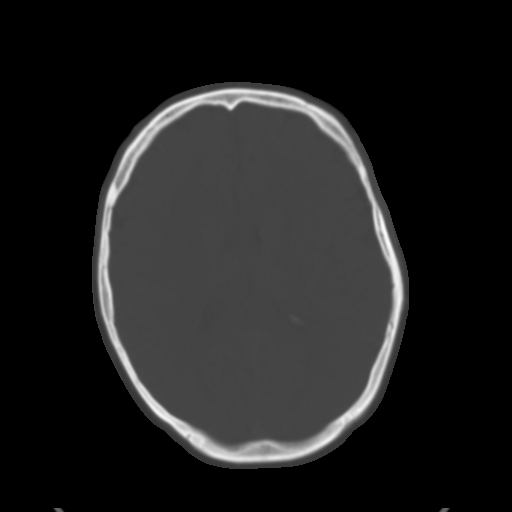
[im 20/33  brain]
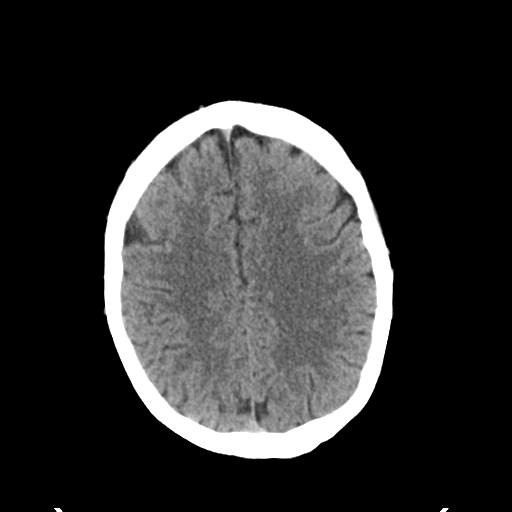
[im 24/33  brain]
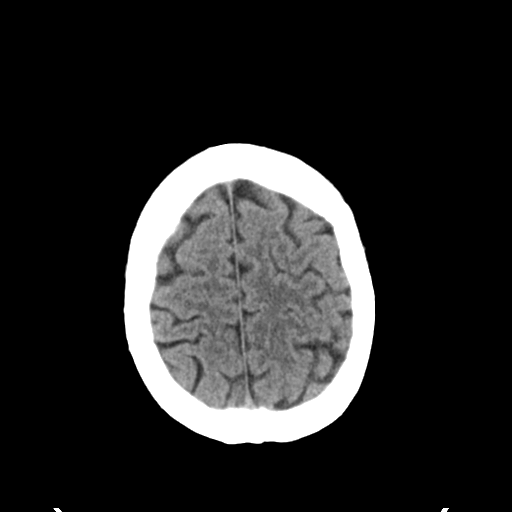
[im 27/33  brain]
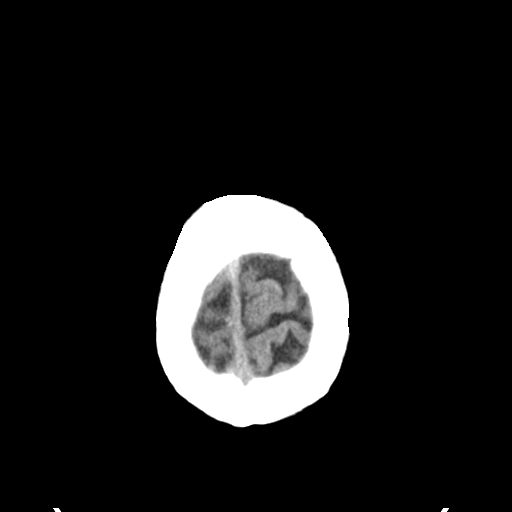
[im 30/33  brain]
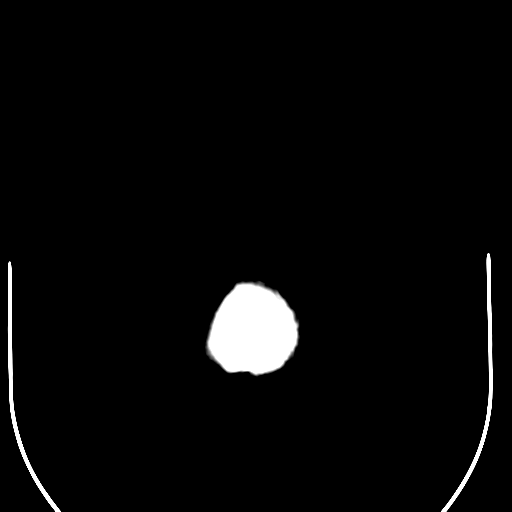
[im 30/33  bone]
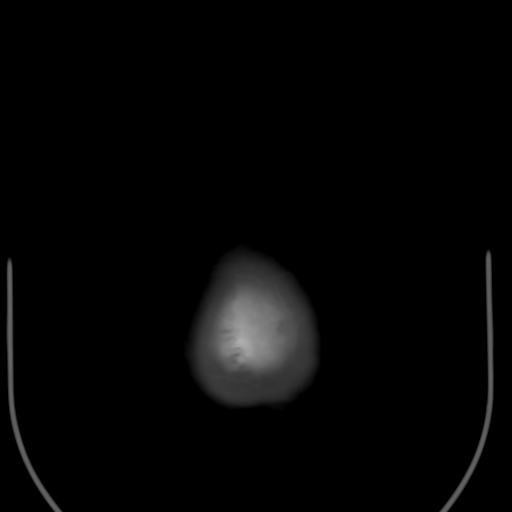

[Series 4: coronal soft tissue · coronal · 0.37mm/px · 3 of 70 slices shown]
[im 24/70  brain]
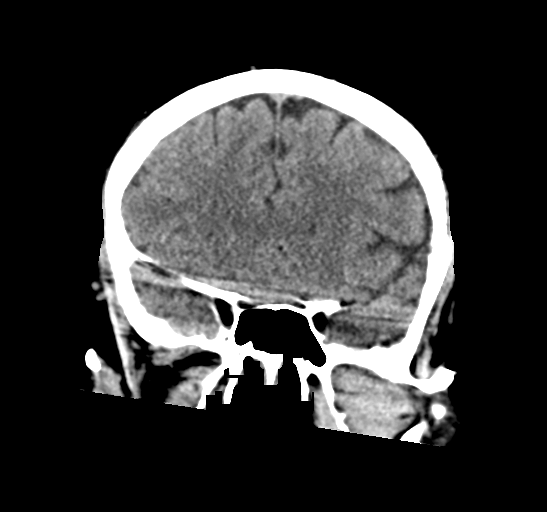
[im 31/70  brain]
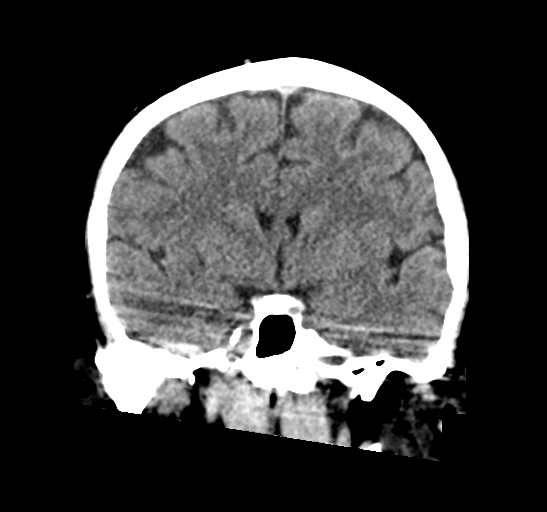
[im 39/70  brain]
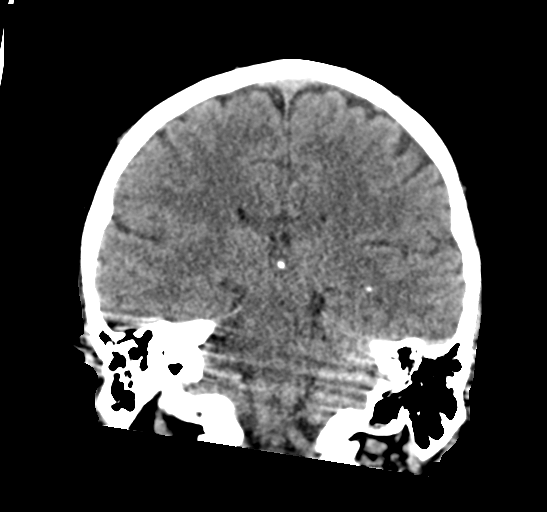

[Series 5: sagittal soft tissue · sagittal · 0.32mm/px · 3 of 67 slices shown]
[im 27/67  brain]
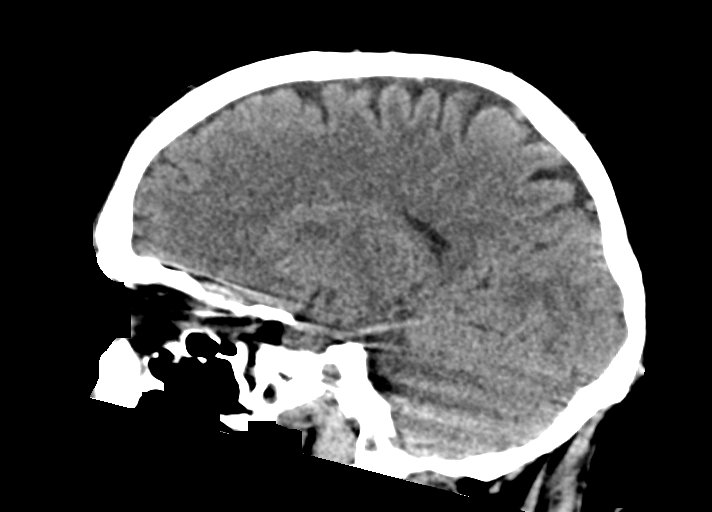
[im 34/67  brain]
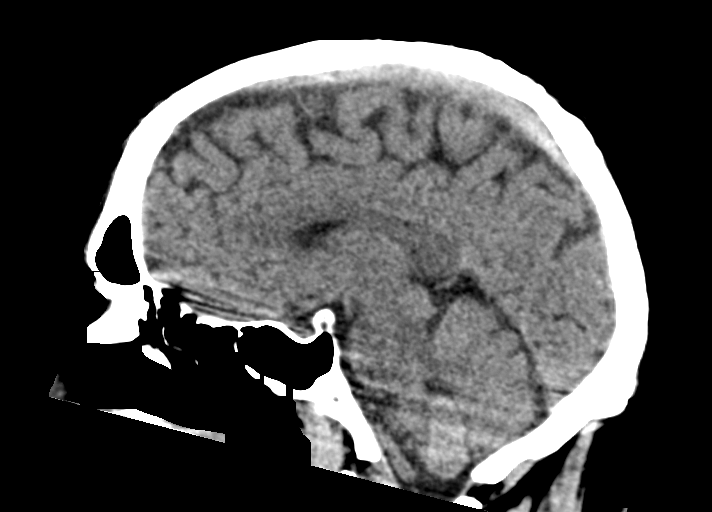
[im 40/67  brain]
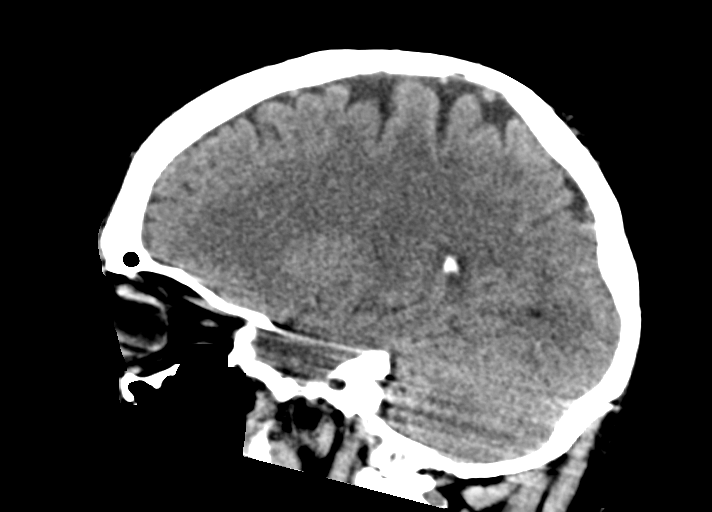

[15 of 47 positions shown; findings below may reference images not displayed]

FINDINGS: Brain: No acute intracranial abnormality. Specifically, no
hemorrhage, hydrocephalus, mass lesion, acute infarction, or
significant intracranial injury.

Vascular: No hyperdense vessel or unexpected calcification.

Skull: Insert calvarium

Sinuses/Orbits: Visualized paranasal sinuses and mastoids clear.
Orbital soft tissues unremarkable.

Other: None
IMPRESSION: No acute intracranial abnormality.

## 2018-02-20 DIAGNOSIS — R269 Unspecified abnormalities of gait and mobility: Secondary | ICD-10-CM | POA: Diagnosis not present

## 2018-02-20 DIAGNOSIS — I279 Pulmonary heart disease, unspecified: Secondary | ICD-10-CM | POA: Diagnosis not present

## 2018-02-20 DIAGNOSIS — J441 Chronic obstructive pulmonary disease with (acute) exacerbation: Secondary | ICD-10-CM | POA: Diagnosis not present

## 2018-03-12 DIAGNOSIS — Z79899 Other long term (current) drug therapy: Secondary | ICD-10-CM | POA: Diagnosis not present

## 2018-03-12 DIAGNOSIS — Z79891 Long term (current) use of opiate analgesic: Secondary | ICD-10-CM | POA: Diagnosis not present

## 2018-03-12 DIAGNOSIS — G8922 Chronic post-thoracotomy pain: Secondary | ICD-10-CM | POA: Diagnosis not present

## 2018-03-12 DIAGNOSIS — G894 Chronic pain syndrome: Secondary | ICD-10-CM | POA: Diagnosis not present

## 2018-03-12 DIAGNOSIS — Z5181 Encounter for therapeutic drug level monitoring: Secondary | ICD-10-CM | POA: Diagnosis not present

## 2018-03-12 DIAGNOSIS — G8929 Other chronic pain: Secondary | ICD-10-CM | POA: Diagnosis not present

## 2018-03-12 DIAGNOSIS — M25561 Pain in right knee: Secondary | ICD-10-CM | POA: Diagnosis not present

## 2018-03-12 DIAGNOSIS — M25551 Pain in right hip: Secondary | ICD-10-CM | POA: Diagnosis not present

## 2018-03-23 DIAGNOSIS — I279 Pulmonary heart disease, unspecified: Secondary | ICD-10-CM | POA: Diagnosis not present

## 2018-03-23 DIAGNOSIS — J441 Chronic obstructive pulmonary disease with (acute) exacerbation: Secondary | ICD-10-CM | POA: Diagnosis not present

## 2018-03-23 DIAGNOSIS — R269 Unspecified abnormalities of gait and mobility: Secondary | ICD-10-CM | POA: Diagnosis not present

## 2018-04-23 DIAGNOSIS — I279 Pulmonary heart disease, unspecified: Secondary | ICD-10-CM | POA: Diagnosis not present

## 2018-04-23 DIAGNOSIS — J441 Chronic obstructive pulmonary disease with (acute) exacerbation: Secondary | ICD-10-CM | POA: Diagnosis not present

## 2018-04-23 DIAGNOSIS — R269 Unspecified abnormalities of gait and mobility: Secondary | ICD-10-CM | POA: Diagnosis not present

## 2018-05-07 DIAGNOSIS — M25561 Pain in right knee: Secondary | ICD-10-CM | POA: Diagnosis not present

## 2018-05-07 DIAGNOSIS — G8922 Chronic post-thoracotomy pain: Secondary | ICD-10-CM | POA: Diagnosis not present

## 2018-05-07 DIAGNOSIS — Z79891 Long term (current) use of opiate analgesic: Secondary | ICD-10-CM | POA: Diagnosis not present

## 2018-05-07 DIAGNOSIS — M25551 Pain in right hip: Secondary | ICD-10-CM | POA: Diagnosis not present

## 2018-05-07 DIAGNOSIS — Z5181 Encounter for therapeutic drug level monitoring: Secondary | ICD-10-CM | POA: Diagnosis not present

## 2018-05-07 DIAGNOSIS — G894 Chronic pain syndrome: Secondary | ICD-10-CM | POA: Diagnosis not present

## 2018-05-07 DIAGNOSIS — G8921 Chronic pain due to trauma: Secondary | ICD-10-CM | POA: Diagnosis not present

## 2018-05-07 DIAGNOSIS — G8929 Other chronic pain: Secondary | ICD-10-CM | POA: Diagnosis not present

## 2018-05-07 DIAGNOSIS — Z79899 Other long term (current) drug therapy: Secondary | ICD-10-CM | POA: Diagnosis not present

## 2018-05-16 DIAGNOSIS — K210 Gastro-esophageal reflux disease with esophagitis: Secondary | ICD-10-CM | POA: Diagnosis not present

## 2018-05-16 DIAGNOSIS — Z6824 Body mass index (BMI) 24.0-24.9, adult: Secondary | ICD-10-CM | POA: Diagnosis not present

## 2018-05-16 DIAGNOSIS — J449 Chronic obstructive pulmonary disease, unspecified: Secondary | ICD-10-CM | POA: Diagnosis not present

## 2018-05-22 DIAGNOSIS — R269 Unspecified abnormalities of gait and mobility: Secondary | ICD-10-CM | POA: Diagnosis not present

## 2018-05-22 DIAGNOSIS — I279 Pulmonary heart disease, unspecified: Secondary | ICD-10-CM | POA: Diagnosis not present

## 2018-05-22 DIAGNOSIS — J441 Chronic obstructive pulmonary disease with (acute) exacerbation: Secondary | ICD-10-CM | POA: Diagnosis not present

## 2018-06-03 IMAGING — DX DG CHEST 2V
2 series · 2 of 2 positions shown · non-contrast
Comparison: [facility] [HOSPITAL] nuclear medicine cardiac
study.

Chest radiographs 04/02/2016 and earlier.

CLINICAL DATA: 51-year-old male with chest pain radiating to the
left arm. Abnormal nuclear medicine cardiac study yesterday.

EXAM:
CHEST  2 VIEW

[chest pa]
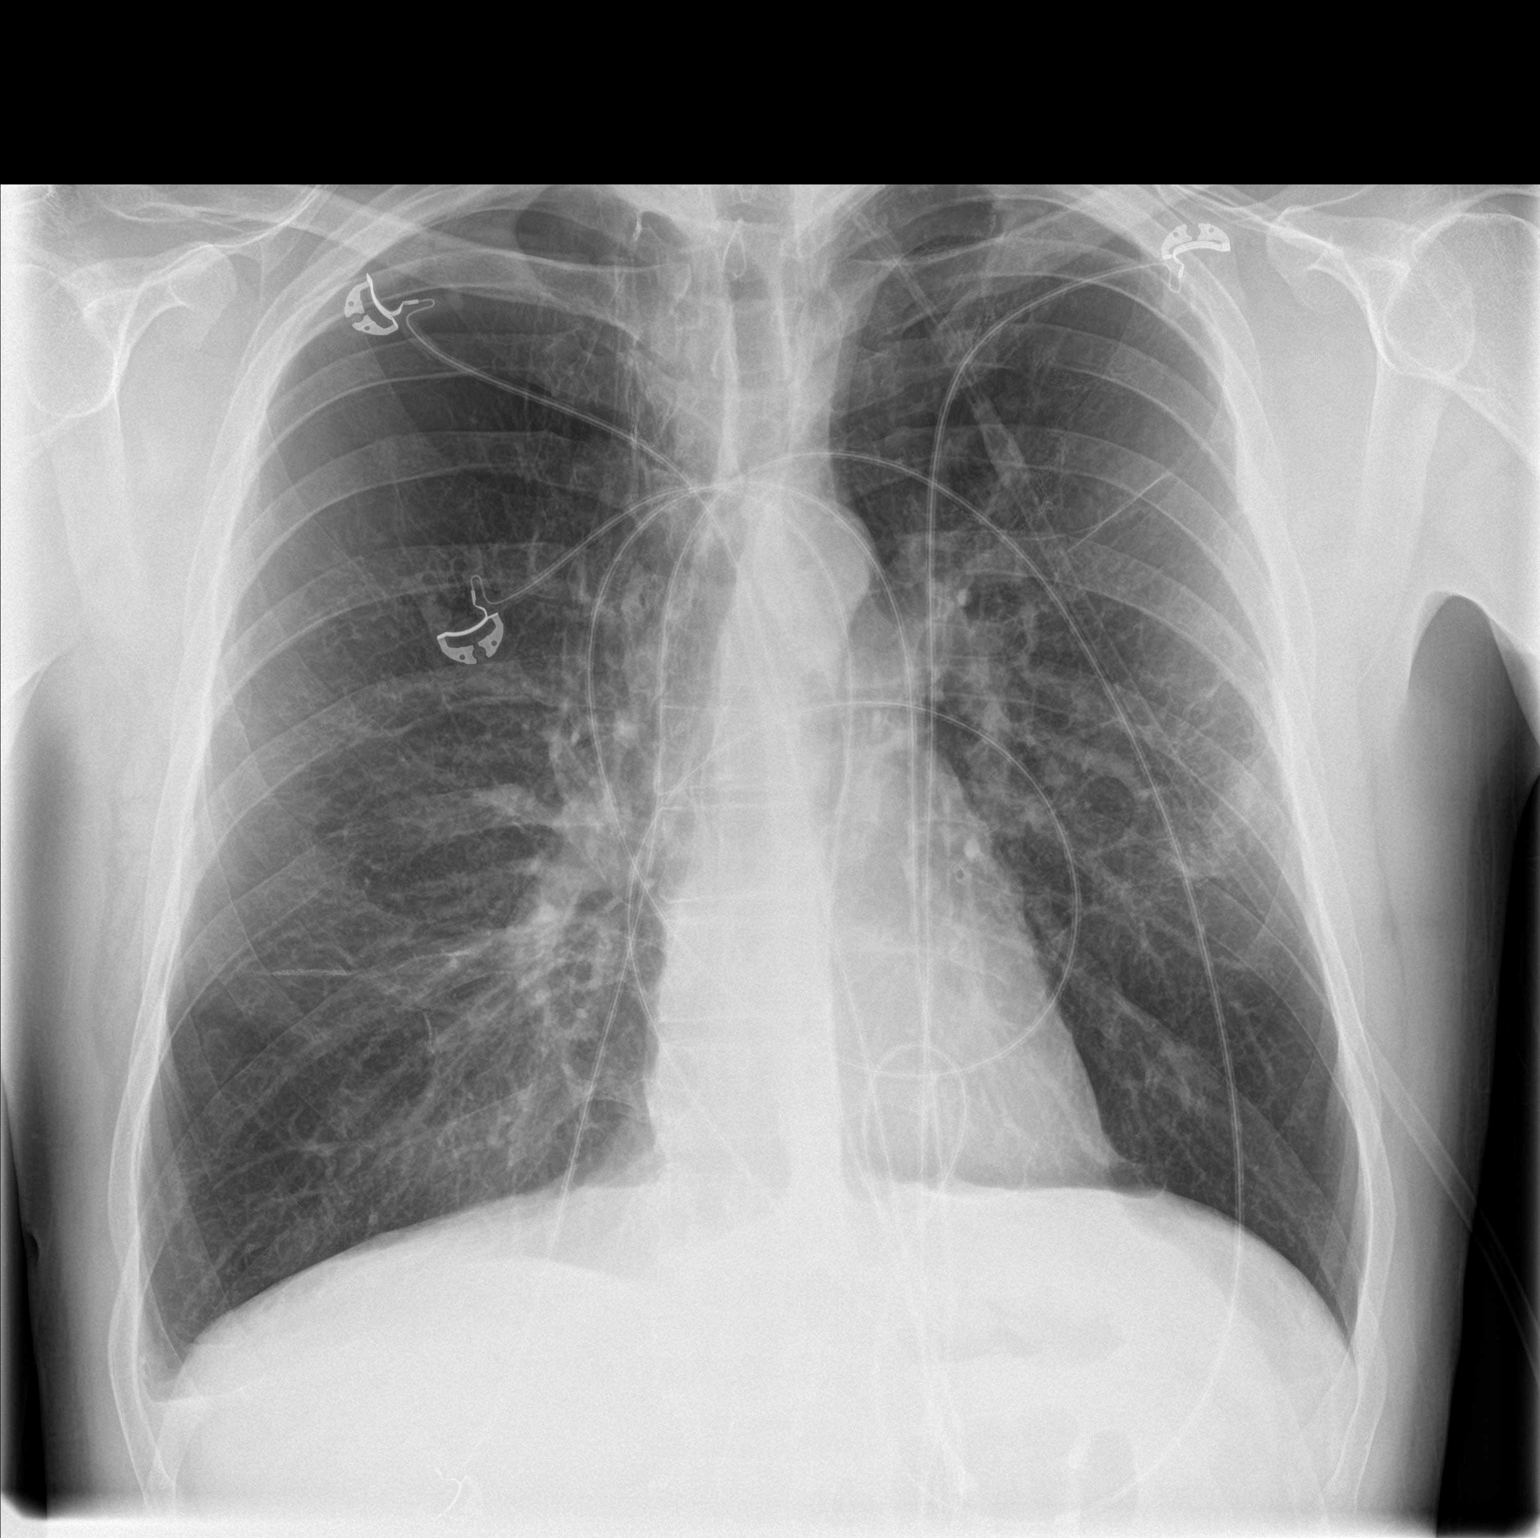

[chest lat]
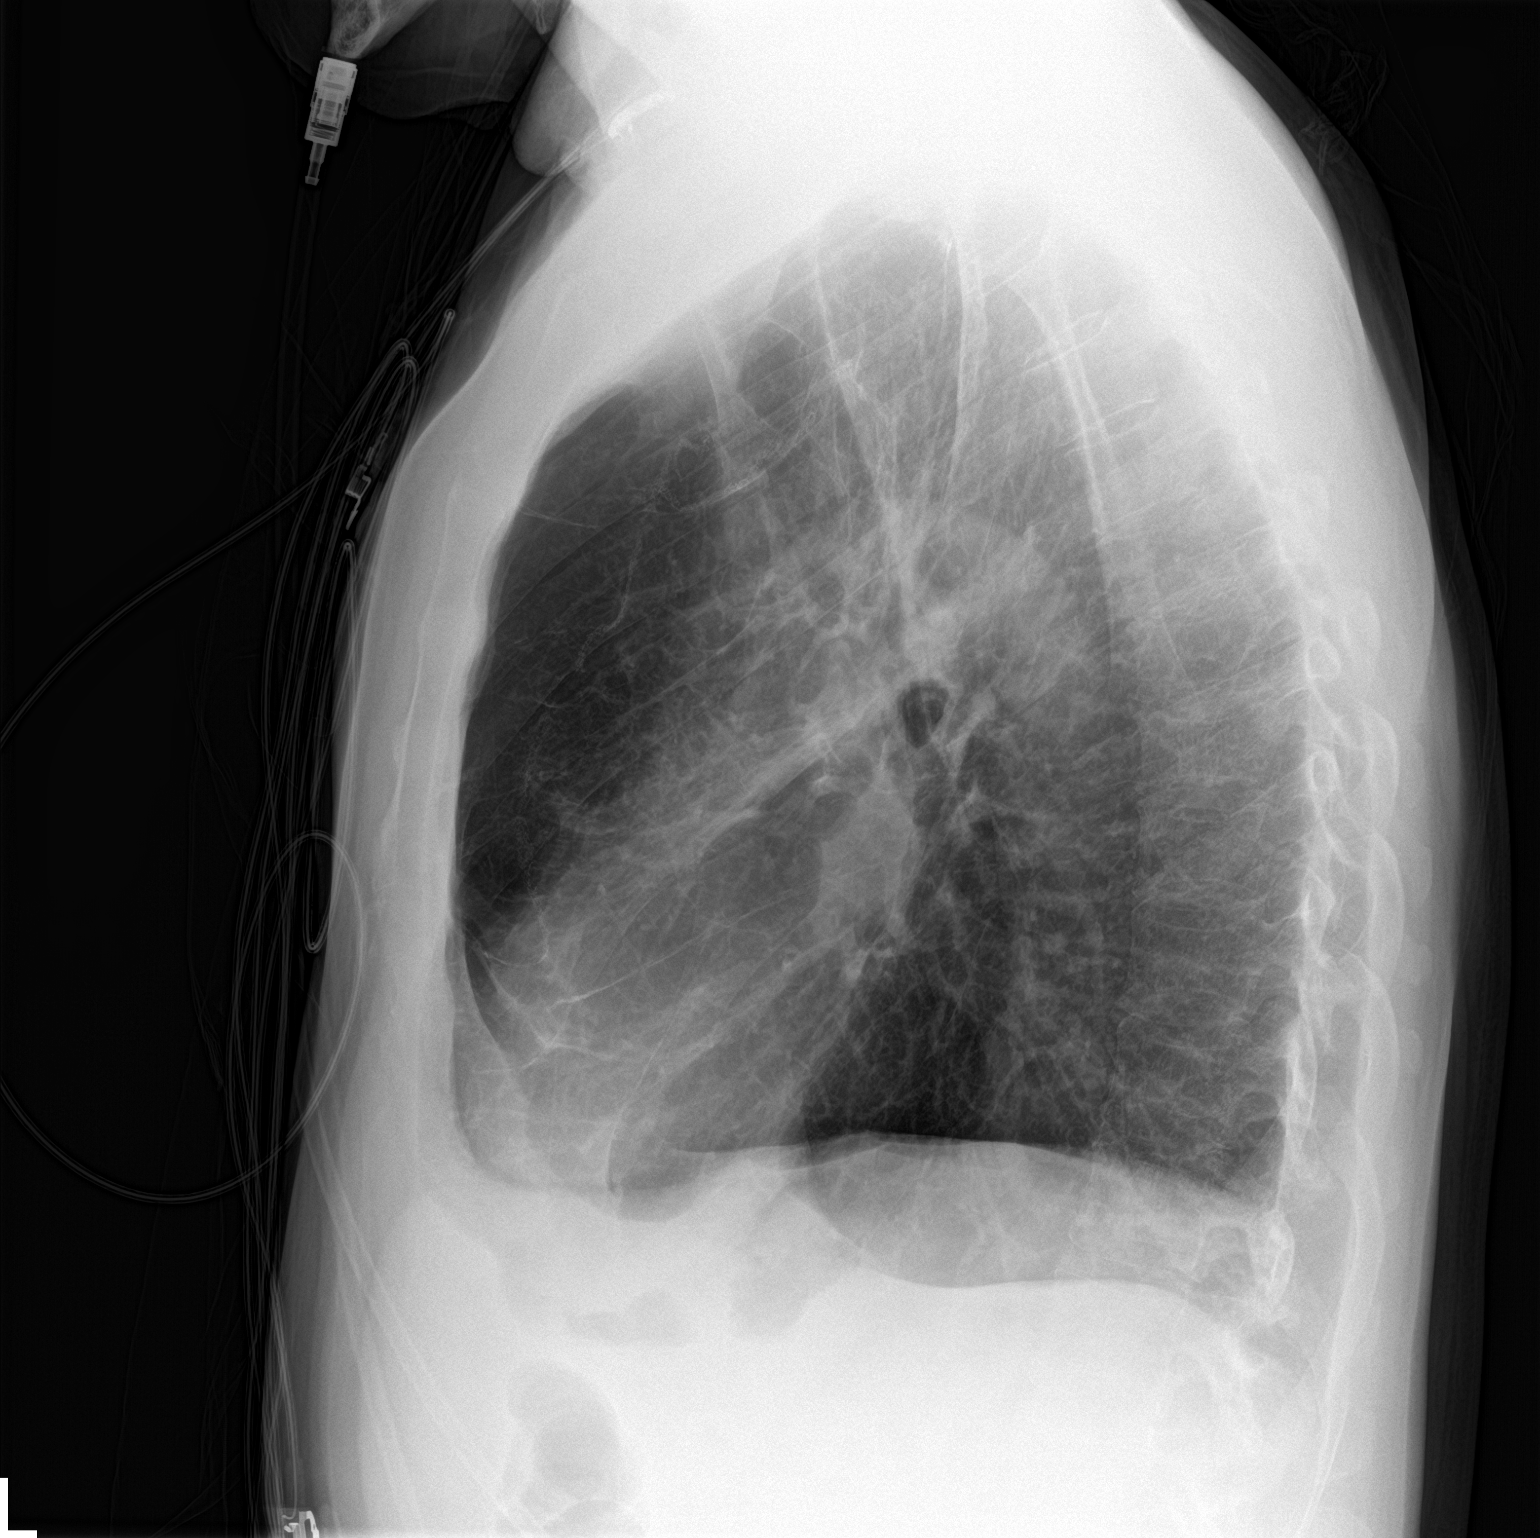

[2 of 2 positions shown; findings below may reference images not displayed]

FINDINGS: Chronic large lung volumes with emphysema. Resolved widespread right
greater than left reticulonodular pulmonary opacity since [REDACTED].
No pneumothorax, pulmonary edema, pleural effusion or acute
pulmonary opacity. Mediastinal contours remain normal. Osteopenia.
No acute osseous abnormality identified. Negative visible bowel gas
pattern.
IMPRESSION: 1. Chronic lung disease including emphysema. No superimposed acute
findings identified.
2. See also [HOSPITAL] [facility] / [facility] cardiac nuclear medicine study
07/18/2016.

## 2018-06-22 DIAGNOSIS — I279 Pulmonary heart disease, unspecified: Secondary | ICD-10-CM | POA: Diagnosis not present

## 2018-06-22 DIAGNOSIS — R269 Unspecified abnormalities of gait and mobility: Secondary | ICD-10-CM | POA: Diagnosis not present

## 2018-06-22 DIAGNOSIS — J441 Chronic obstructive pulmonary disease with (acute) exacerbation: Secondary | ICD-10-CM | POA: Diagnosis not present

## 2018-07-03 DIAGNOSIS — Z5181 Encounter for therapeutic drug level monitoring: Secondary | ICD-10-CM | POA: Diagnosis not present

## 2018-07-16 DIAGNOSIS — J440 Chronic obstructive pulmonary disease with acute lower respiratory infection: Secondary | ICD-10-CM | POA: Diagnosis not present

## 2018-07-16 DIAGNOSIS — K210 Gastro-esophageal reflux disease with esophagitis: Secondary | ICD-10-CM | POA: Diagnosis not present

## 2018-07-16 DIAGNOSIS — Z6824 Body mass index (BMI) 24.0-24.9, adult: Secondary | ICD-10-CM | POA: Diagnosis not present

## 2018-07-22 DIAGNOSIS — R269 Unspecified abnormalities of gait and mobility: Secondary | ICD-10-CM | POA: Diagnosis not present

## 2018-07-22 DIAGNOSIS — J441 Chronic obstructive pulmonary disease with (acute) exacerbation: Secondary | ICD-10-CM | POA: Diagnosis not present

## 2018-07-22 DIAGNOSIS — I279 Pulmonary heart disease, unspecified: Secondary | ICD-10-CM | POA: Diagnosis not present

## 2018-08-05 DIAGNOSIS — Z5181 Encounter for therapeutic drug level monitoring: Secondary | ICD-10-CM | POA: Diagnosis not present

## 2018-08-19 DIAGNOSIS — K210 Gastro-esophageal reflux disease with esophagitis: Secondary | ICD-10-CM | POA: Diagnosis not present

## 2018-08-19 DIAGNOSIS — Z6824 Body mass index (BMI) 24.0-24.9, adult: Secondary | ICD-10-CM | POA: Diagnosis not present

## 2018-08-19 DIAGNOSIS — G629 Polyneuropathy, unspecified: Secondary | ICD-10-CM | POA: Diagnosis not present

## 2018-08-19 DIAGNOSIS — J440 Chronic obstructive pulmonary disease with acute lower respiratory infection: Secondary | ICD-10-CM | POA: Diagnosis not present

## 2018-08-22 DIAGNOSIS — I279 Pulmonary heart disease, unspecified: Secondary | ICD-10-CM | POA: Diagnosis not present

## 2018-08-22 DIAGNOSIS — J441 Chronic obstructive pulmonary disease with (acute) exacerbation: Secondary | ICD-10-CM | POA: Diagnosis not present

## 2018-08-22 DIAGNOSIS — R269 Unspecified abnormalities of gait and mobility: Secondary | ICD-10-CM | POA: Diagnosis not present

## 2018-08-26 ENCOUNTER — Encounter (INDEPENDENT_AMBULATORY_CARE_PROVIDER_SITE_OTHER): Payer: Self-pay | Admitting: *Deleted

## 2018-09-05 DIAGNOSIS — Z5181 Encounter for therapeutic drug level monitoring: Secondary | ICD-10-CM | POA: Diagnosis not present

## 2018-09-21 DIAGNOSIS — J441 Chronic obstructive pulmonary disease with (acute) exacerbation: Secondary | ICD-10-CM | POA: Diagnosis not present

## 2018-09-21 DIAGNOSIS — R269 Unspecified abnormalities of gait and mobility: Secondary | ICD-10-CM | POA: Diagnosis not present

## 2018-09-21 DIAGNOSIS — I279 Pulmonary heart disease, unspecified: Secondary | ICD-10-CM | POA: Diagnosis not present

## 2018-10-02 DIAGNOSIS — Z5181 Encounter for therapeutic drug level monitoring: Secondary | ICD-10-CM | POA: Diagnosis not present

## 2018-10-22 DIAGNOSIS — I279 Pulmonary heart disease, unspecified: Secondary | ICD-10-CM | POA: Diagnosis not present

## 2018-10-22 DIAGNOSIS — R269 Unspecified abnormalities of gait and mobility: Secondary | ICD-10-CM | POA: Diagnosis not present

## 2018-10-22 DIAGNOSIS — J441 Chronic obstructive pulmonary disease with (acute) exacerbation: Secondary | ICD-10-CM | POA: Diagnosis not present

## 2018-10-30 DIAGNOSIS — M25551 Pain in right hip: Secondary | ICD-10-CM | POA: Diagnosis not present

## 2018-10-30 DIAGNOSIS — Z79899 Other long term (current) drug therapy: Secondary | ICD-10-CM | POA: Diagnosis not present

## 2018-10-30 DIAGNOSIS — G8922 Chronic post-thoracotomy pain: Secondary | ICD-10-CM | POA: Diagnosis not present

## 2018-10-30 DIAGNOSIS — G8921 Chronic pain due to trauma: Secondary | ICD-10-CM | POA: Diagnosis not present

## 2018-10-30 DIAGNOSIS — Z79891 Long term (current) use of opiate analgesic: Secondary | ICD-10-CM | POA: Diagnosis not present

## 2018-10-30 DIAGNOSIS — G894 Chronic pain syndrome: Secondary | ICD-10-CM | POA: Diagnosis not present

## 2018-10-30 DIAGNOSIS — M25561 Pain in right knee: Secondary | ICD-10-CM | POA: Diagnosis not present

## 2018-10-30 DIAGNOSIS — Z5181 Encounter for therapeutic drug level monitoring: Secondary | ICD-10-CM | POA: Diagnosis not present

## 2018-10-30 DIAGNOSIS — G8929 Other chronic pain: Secondary | ICD-10-CM | POA: Diagnosis not present

## 2018-11-13 DIAGNOSIS — J441 Chronic obstructive pulmonary disease with (acute) exacerbation: Secondary | ICD-10-CM | POA: Diagnosis not present

## 2018-11-13 DIAGNOSIS — I279 Pulmonary heart disease, unspecified: Secondary | ICD-10-CM | POA: Diagnosis not present

## 2018-11-13 DIAGNOSIS — R269 Unspecified abnormalities of gait and mobility: Secondary | ICD-10-CM | POA: Diagnosis not present

## 2018-11-22 DIAGNOSIS — I279 Pulmonary heart disease, unspecified: Secondary | ICD-10-CM | POA: Diagnosis not present

## 2018-11-22 DIAGNOSIS — J441 Chronic obstructive pulmonary disease with (acute) exacerbation: Secondary | ICD-10-CM | POA: Diagnosis not present

## 2018-11-22 DIAGNOSIS — R269 Unspecified abnormalities of gait and mobility: Secondary | ICD-10-CM | POA: Diagnosis not present

## 2018-11-26 DIAGNOSIS — Z79891 Long term (current) use of opiate analgesic: Secondary | ICD-10-CM | POA: Diagnosis not present

## 2018-11-26 DIAGNOSIS — Z5181 Encounter for therapeutic drug level monitoring: Secondary | ICD-10-CM | POA: Diagnosis not present

## 2018-11-26 DIAGNOSIS — G894 Chronic pain syndrome: Secondary | ICD-10-CM | POA: Diagnosis not present

## 2018-11-26 DIAGNOSIS — G8922 Chronic post-thoracotomy pain: Secondary | ICD-10-CM | POA: Diagnosis not present

## 2018-11-26 DIAGNOSIS — G8929 Other chronic pain: Secondary | ICD-10-CM | POA: Diagnosis not present

## 2018-11-26 DIAGNOSIS — Z79899 Other long term (current) drug therapy: Secondary | ICD-10-CM | POA: Diagnosis not present

## 2018-11-26 DIAGNOSIS — M25551 Pain in right hip: Secondary | ICD-10-CM | POA: Diagnosis not present

## 2018-11-26 DIAGNOSIS — M25561 Pain in right knee: Secondary | ICD-10-CM | POA: Diagnosis not present

## 2018-12-03 DIAGNOSIS — Z79899 Other long term (current) drug therapy: Secondary | ICD-10-CM | POA: Diagnosis not present

## 2018-12-03 DIAGNOSIS — Z6824 Body mass index (BMI) 24.0-24.9, adult: Secondary | ICD-10-CM | POA: Diagnosis not present

## 2018-12-03 DIAGNOSIS — G629 Polyneuropathy, unspecified: Secondary | ICD-10-CM | POA: Diagnosis not present

## 2018-12-03 DIAGNOSIS — Z125 Encounter for screening for malignant neoplasm of prostate: Secondary | ICD-10-CM | POA: Diagnosis not present

## 2018-12-03 DIAGNOSIS — G6289 Other specified polyneuropathies: Secondary | ICD-10-CM | POA: Diagnosis not present

## 2018-12-03 DIAGNOSIS — J440 Chronic obstructive pulmonary disease with acute lower respiratory infection: Secondary | ICD-10-CM | POA: Diagnosis not present

## 2018-12-03 DIAGNOSIS — Z131 Encounter for screening for diabetes mellitus: Secondary | ICD-10-CM | POA: Diagnosis not present

## 2018-12-03 DIAGNOSIS — K210 Gastro-esophageal reflux disease with esophagitis: Secondary | ICD-10-CM | POA: Diagnosis not present

## 2018-12-05 DIAGNOSIS — R269 Unspecified abnormalities of gait and mobility: Secondary | ICD-10-CM | POA: Diagnosis not present

## 2018-12-05 DIAGNOSIS — J441 Chronic obstructive pulmonary disease with (acute) exacerbation: Secondary | ICD-10-CM | POA: Diagnosis not present

## 2018-12-05 DIAGNOSIS — I279 Pulmonary heart disease, unspecified: Secondary | ICD-10-CM | POA: Diagnosis not present

## 2018-12-22 DIAGNOSIS — I279 Pulmonary heart disease, unspecified: Secondary | ICD-10-CM | POA: Diagnosis not present

## 2018-12-22 DIAGNOSIS — R269 Unspecified abnormalities of gait and mobility: Secondary | ICD-10-CM | POA: Diagnosis not present

## 2018-12-22 DIAGNOSIS — J441 Chronic obstructive pulmonary disease with (acute) exacerbation: Secondary | ICD-10-CM | POA: Diagnosis not present

## 2018-12-30 DIAGNOSIS — Z79899 Other long term (current) drug therapy: Secondary | ICD-10-CM | POA: Diagnosis not present

## 2018-12-30 DIAGNOSIS — Z79891 Long term (current) use of opiate analgesic: Secondary | ICD-10-CM | POA: Diagnosis not present

## 2018-12-30 DIAGNOSIS — Z5181 Encounter for therapeutic drug level monitoring: Secondary | ICD-10-CM | POA: Diagnosis not present

## 2018-12-30 DIAGNOSIS — G894 Chronic pain syndrome: Secondary | ICD-10-CM | POA: Diagnosis not present

## 2018-12-30 DIAGNOSIS — G8922 Chronic post-thoracotomy pain: Secondary | ICD-10-CM | POA: Diagnosis not present

## 2018-12-30 DIAGNOSIS — G8929 Other chronic pain: Secondary | ICD-10-CM | POA: Diagnosis not present

## 2018-12-30 DIAGNOSIS — M25561 Pain in right knee: Secondary | ICD-10-CM | POA: Diagnosis not present

## 2018-12-30 DIAGNOSIS — M25551 Pain in right hip: Secondary | ICD-10-CM | POA: Diagnosis not present

## 2019-01-06 DIAGNOSIS — Z6824 Body mass index (BMI) 24.0-24.9, adult: Secondary | ICD-10-CM | POA: Diagnosis not present

## 2019-01-06 DIAGNOSIS — J439 Emphysema, unspecified: Secondary | ICD-10-CM | POA: Diagnosis not present

## 2019-01-06 DIAGNOSIS — M940 Chondrocostal junction syndrome [Tietze]: Secondary | ICD-10-CM | POA: Diagnosis not present

## 2019-01-08 ENCOUNTER — Encounter (HOSPITAL_COMMUNITY): Payer: Self-pay | Admitting: *Deleted

## 2019-01-08 ENCOUNTER — Emergency Department (HOSPITAL_COMMUNITY): Payer: Medicare HMO

## 2019-01-08 ENCOUNTER — Inpatient Hospital Stay (HOSPITAL_COMMUNITY)
Admission: EM | Admit: 2019-01-08 | Discharge: 2019-01-14 | DRG: 194 | Disposition: A | Discharge status: Home / Self Care | Payer: Medicare HMO | Attending: Internal Medicine | Admitting: Internal Medicine

## 2019-01-08 ENCOUNTER — Other Ambulatory Visit: Payer: Self-pay

## 2019-01-08 DIAGNOSIS — Z9981 Dependence on supplemental oxygen: Secondary | ICD-10-CM | POA: Diagnosis not present

## 2019-01-08 DIAGNOSIS — Z23 Encounter for immunization: Secondary | ICD-10-CM | POA: Diagnosis not present

## 2019-01-08 DIAGNOSIS — M79604 Pain in right leg: Secondary | ICD-10-CM | POA: Diagnosis not present

## 2019-01-08 DIAGNOSIS — G8929 Other chronic pain: Secondary | ICD-10-CM | POA: Diagnosis present

## 2019-01-08 DIAGNOSIS — Z79899 Other long term (current) drug therapy: Secondary | ICD-10-CM

## 2019-01-08 DIAGNOSIS — Z888 Allergy status to other drugs, medicaments and biological substances status: Secondary | ICD-10-CM

## 2019-01-08 DIAGNOSIS — J61 Pneumoconiosis due to asbestos and other mineral fibers: Secondary | ICD-10-CM | POA: Diagnosis not present

## 2019-01-08 DIAGNOSIS — J439 Emphysema, unspecified: Secondary | ICD-10-CM | POA: Diagnosis not present

## 2019-01-08 DIAGNOSIS — R69 Illness, unspecified: Secondary | ICD-10-CM | POA: Diagnosis not present

## 2019-01-08 DIAGNOSIS — J441 Chronic obstructive pulmonary disease with (acute) exacerbation: Secondary | ICD-10-CM

## 2019-01-08 DIAGNOSIS — R0789 Other chest pain: Secondary | ICD-10-CM | POA: Diagnosis present

## 2019-01-08 DIAGNOSIS — G894 Chronic pain syndrome: Secondary | ICD-10-CM | POA: Diagnosis not present

## 2019-01-08 DIAGNOSIS — Z20828 Contact with and (suspected) exposure to other viral communicable diseases: Secondary | ICD-10-CM | POA: Diagnosis not present

## 2019-01-08 DIAGNOSIS — J449 Chronic obstructive pulmonary disease, unspecified: Secondary | ICD-10-CM | POA: Diagnosis present

## 2019-01-08 DIAGNOSIS — Z7982 Long term (current) use of aspirin: Secondary | ICD-10-CM | POA: Diagnosis not present

## 2019-01-08 DIAGNOSIS — J9611 Chronic respiratory failure with hypoxia: Secondary | ICD-10-CM

## 2019-01-08 DIAGNOSIS — M810 Age-related osteoporosis without current pathological fracture: Secondary | ICD-10-CM | POA: Diagnosis present

## 2019-01-08 DIAGNOSIS — Z79891 Long term (current) use of opiate analgesic: Secondary | ICD-10-CM | POA: Diagnosis not present

## 2019-01-08 DIAGNOSIS — F17290 Nicotine dependence, other tobacco product, uncomplicated: Secondary | ICD-10-CM | POA: Diagnosis present

## 2019-01-08 DIAGNOSIS — M79606 Pain in leg, unspecified: Secondary | ICD-10-CM

## 2019-01-08 DIAGNOSIS — Z7951 Long term (current) use of inhaled steroids: Secondary | ICD-10-CM

## 2019-01-08 DIAGNOSIS — M79605 Pain in left leg: Secondary | ICD-10-CM | POA: Diagnosis not present

## 2019-01-08 DIAGNOSIS — G629 Polyneuropathy, unspecified: Secondary | ICD-10-CM | POA: Diagnosis not present

## 2019-01-08 DIAGNOSIS — M549 Dorsalgia, unspecified: Secondary | ICD-10-CM | POA: Diagnosis not present

## 2019-01-08 DIAGNOSIS — J189 Pneumonia, unspecified organism: Secondary | ICD-10-CM | POA: Diagnosis not present

## 2019-01-08 DIAGNOSIS — R600 Localized edema: Secondary | ICD-10-CM | POA: Diagnosis not present

## 2019-01-08 DIAGNOSIS — R0602 Shortness of breath: Secondary | ICD-10-CM | POA: Diagnosis present

## 2019-01-08 LAB — CBC WITH DIFFERENTIAL/PLATELET
Abs Immature Granulocytes: 0.02 10*3/uL (ref 0.00–0.07)
Basophils Absolute: 0.1 10*3/uL (ref 0.0–0.1)
Basophils Relative: 1 %
Eosinophils Absolute: 0.1 10*3/uL (ref 0.0–0.5)
Eosinophils Relative: 1 %
HCT: 43.9 % (ref 39.0–52.0)
Hemoglobin: 13.9 g/dL (ref 13.0–17.0)
Immature Granulocytes: 0 %
Lymphocytes Relative: 16 %
Lymphs Abs: 1.2 10*3/uL (ref 0.7–4.0)
MCH: 31.4 pg (ref 26.0–34.0)
MCHC: 31.7 g/dL (ref 30.0–36.0)
MCV: 99.3 fL (ref 80.0–100.0)
Monocytes Absolute: 0.8 10*3/uL (ref 0.1–1.0)
Monocytes Relative: 10 %
Neutro Abs: 5.5 10*3/uL (ref 1.7–7.7)
Neutrophils Relative %: 72 %
Platelets: 350 10*3/uL (ref 150–400)
RBC: 4.42 MIL/uL (ref 4.22–5.81)
RDW: 11.8 % (ref 11.5–15.5)
WBC: 7.7 10*3/uL (ref 4.0–10.5)
nRBC: 0 % (ref 0.0–0.2)

## 2019-01-08 LAB — BASIC METABOLIC PANEL
Anion gap: 9 (ref 5–15)
BUN: 11 mg/dL (ref 6–20)
CO2: 28 mmol/L (ref 22–32)
Calcium: 9.2 mg/dL (ref 8.9–10.3)
Chloride: 102 mmol/L (ref 98–111)
Creatinine, Ser: 0.87 mg/dL (ref 0.61–1.24)
GFR calc Af Amer: >60 mL/min (ref >60)
GFR calc non Af Amer: >60 mL/min (ref >60)
Glucose, Bld: 112 mg/dL — ABNORMAL HIGH (ref 70–99)
Potassium: 3.9 mmol/L (ref 3.5–5.1)
Sodium: 139 mmol/L (ref 135–145)

## 2019-01-08 LAB — D-DIMER, QUANTITATIVE: D-Dimer, Quant: 0.48 ug/mL-FEU (ref 0.00–0.50)

## 2019-01-08 LAB — BRAIN NATRIURETIC PEPTIDE: B Natriuretic Peptide: 21 pg/mL (ref 0.0–100.0)

## 2019-01-08 LAB — MAGNESIUM: Magnesium: 2.8 mg/dL — ABNORMAL HIGH (ref 1.7–2.4)

## 2019-01-08 LAB — TROPONIN I (HIGH SENSITIVITY)
Troponin I (High Sensitivity): <2 ng/L (ref <18)
Troponin I (High Sensitivity): <2 ng/L (ref <18)

## 2019-01-08 LAB — LACTIC ACID, PLASMA: Lactic Acid, Venous: 1.2 mmol/L (ref 0.5–1.9)

## 2019-01-08 MED ORDER — MONTELUKAST SODIUM 10 MG PO TABS
10.0000 mg | ORAL_TABLET | Freq: Every day | ORAL | Status: DC
  Administered 2019-01-09 – 2019-01-14 (×6): 10 mg via ORAL
  Filled 2019-01-08 (×6): qty 1

## 2019-01-08 MED ORDER — ACETAMINOPHEN 650 MG RE SUPP: 650.0000 mg | Freq: Four times a day (QID) | RECTAL | Status: DC | PRN

## 2019-01-08 MED ORDER — METHYLPREDNISOLONE SODIUM SUCC 125 MG IJ SOLR
60.0000 mg | Freq: Once | INTRAMUSCULAR | Status: AC
  Administered 2019-01-08: 13:00:00 60 mg via INTRAVENOUS
  Filled 2019-01-08: qty 2

## 2019-01-08 MED ORDER — ALBUTEROL SULFATE (2.5 MG/3ML) 0.083% IN NEBU
5.0000 mg | INHALATION_SOLUTION | Freq: Once | RESPIRATORY_TRACT | Status: AC
  Administered 2019-01-08: 13:00:00 5 mg via RESPIRATORY_TRACT
  Filled 2019-01-08: qty 6

## 2019-01-08 MED ORDER — MORPHINE SULFATE ER 30 MG PO TBCR
30.0000 mg | EXTENDED_RELEASE_TABLET | Freq: Three times a day (TID) | ORAL | Status: DC
  Administered 2019-01-08 – 2019-01-14 (×18): 30 mg via ORAL
  Filled 2019-01-08 (×10): qty 1
  Filled 2019-01-08 (×2): qty 2
  Filled 2019-01-08: qty 1
  Filled 2019-01-08: qty 2
  Filled 2019-01-08 (×4): qty 1

## 2019-01-08 MED ORDER — ALBUTEROL SULFATE (2.5 MG/3ML) 0.083% IN NEBU: 2.5000 mg | INHALATION_SOLUTION | RESPIRATORY_TRACT | Status: DC | PRN

## 2019-01-08 MED ORDER — SODIUM CHLORIDE 0.9 % IV SOLN
INTRAVENOUS | Status: AC
  Administered 2019-01-08 (×2): via INTRAVENOUS

## 2019-01-08 MED ORDER — ONDANSETRON HCL 4 MG/2ML IJ SOLN: 4.0000 mg | Freq: Four times a day (QID) | INTRAMUSCULAR | Status: DC | PRN

## 2019-01-08 MED ORDER — SODIUM CHLORIDE 0.9 % IV SOLN: 1.0000 g | INTRAVENOUS | Status: DC

## 2019-01-08 MED ORDER — HYDROMORPHONE HCL 1 MG/ML IJ SOLN
1.0000 mg | Freq: Once | INTRAMUSCULAR | Status: AC
  Administered 2019-01-08: 14:00:00 1 mg via INTRAVENOUS
  Filled 2019-01-08: qty 1

## 2019-01-08 MED ORDER — PREGABALIN 75 MG PO CAPS
150.0000 mg | ORAL_CAPSULE | Freq: Three times a day (TID) | ORAL | Status: DC
  Administered 2019-01-08 – 2019-01-14 (×18): 150 mg via ORAL
  Filled 2019-01-08: qty 2
  Filled 2019-01-08: qty 6
  Filled 2019-01-08 (×3): qty 2
  Filled 2019-01-08: qty 6
  Filled 2019-01-08 (×12): qty 2

## 2019-01-08 MED ORDER — HYDROMORPHONE HCL 1 MG/ML IJ SOLN
1.0000 mg | Freq: Once | INTRAMUSCULAR | Status: AC
  Administered 2019-01-08: 15:00:00 1 mg via INTRAVENOUS
  Filled 2019-01-08: qty 1

## 2019-01-08 MED ORDER — GABAPENTIN 300 MG PO CAPS: 600.0000 mg | ORAL_CAPSULE | Freq: Three times a day (TID) | ORAL | Status: DC

## 2019-01-08 MED ORDER — HYDROMORPHONE HCL 1 MG/ML IJ SOLN
1.0000 mg | INTRAMUSCULAR | Status: DC | PRN
  Administered 2019-01-08 – 2019-01-11 (×29): 1 mg via INTRAVENOUS
  Filled 2019-01-08 (×32): qty 1

## 2019-01-08 MED ORDER — MAGNESIUM SULFATE 2 GM/50ML IV SOLN
2.0000 g | Freq: Once | INTRAVENOUS | Status: AC
  Administered 2019-01-08: 2 g via INTRAVENOUS
  Filled 2019-01-08: qty 50

## 2019-01-08 MED ORDER — SODIUM CHLORIDE 0.9 % IV SOLN
1.0000 g | Freq: Once | INTRAVENOUS | Status: AC
  Administered 2019-01-08: 1 g via INTRAVENOUS
  Filled 2019-01-08: qty 10

## 2019-01-08 MED ORDER — METHYLPREDNISOLONE SODIUM SUCC 40 MG IJ SOLR
40.0000 mg | Freq: Four times a day (QID) | INTRAMUSCULAR | Status: DC
  Administered 2019-01-08 – 2019-01-10 (×7): 40 mg via INTRAVENOUS
  Filled 2019-01-08 (×7): qty 1

## 2019-01-08 MED ORDER — ENOXAPARIN SODIUM 40 MG/0.4ML ~~LOC~~ SOLN
40.0000 mg | SUBCUTANEOUS | Status: DC
  Administered 2019-01-09 – 2019-01-14 (×6): 40 mg via SUBCUTANEOUS
  Filled 2019-01-08 (×6): qty 0.4

## 2019-01-08 MED ORDER — SODIUM CHLORIDE 0.9 % IV SOLN
500.0000 mg | INTRAVENOUS | Status: DC
  Administered 2019-01-09 – 2019-01-10 (×2): 500 mg via INTRAVENOUS
  Filled 2019-01-08 (×2): qty 500

## 2019-01-08 MED ORDER — MOMETASONE FURO-FORMOTEROL FUM 200-5 MCG/ACT IN AERO
2.0000 | INHALATION_SPRAY | Freq: Two times a day (BID) | RESPIRATORY_TRACT | Status: DC
  Administered 2019-01-09: 2 via RESPIRATORY_TRACT
  Filled 2019-01-08: qty 8.8

## 2019-01-08 MED ORDER — ASPIRIN 81 MG PO CHEW
81.0000 mg | CHEWABLE_TABLET | Freq: Every day | ORAL | Status: DC
  Administered 2019-01-09 – 2019-01-14 (×6): 81 mg via ORAL
  Filled 2019-01-08 (×6): qty 1

## 2019-01-08 MED ORDER — SODIUM CHLORIDE 0.9 % IV SOLN
500.0000 mg | Freq: Once | INTRAVENOUS | Status: AC
  Administered 2019-01-08: 16:00:00 500 mg via INTRAVENOUS
  Filled 2019-01-08: qty 500

## 2019-01-08 MED ORDER — SODIUM CHLORIDE 0.9 % IV SOLN
2.0000 g | INTRAVENOUS | Status: DC
  Administered 2019-01-09 – 2019-01-12 (×4): 2 g via INTRAVENOUS
  Filled 2019-01-08 (×4): qty 20

## 2019-01-08 MED ORDER — KETOROLAC TROMETHAMINE 15 MG/ML IJ SOLN: 15.0000 mg | Freq: Four times a day (QID) | INTRAMUSCULAR | Status: DC | PRN

## 2019-01-08 MED ORDER — ALBUTEROL SULFATE HFA 108 (90 BASE) MCG/ACT IN AERS: 4.0000 | INHALATION_SPRAY | Freq: Once | RESPIRATORY_TRACT | Status: DC

## 2019-01-08 MED ORDER — FENTANYL CITRATE (PF) 100 MCG/2ML IJ SOLN
50.0000 ug | Freq: Once | INTRAMUSCULAR | Status: AC
  Administered 2019-01-08: 13:00:00 50 ug via INTRAVENOUS
  Filled 2019-01-08: qty 2

## 2019-01-08 MED ORDER — IOHEXOL 350 MG/ML SOLN
100.0000 mL | Freq: Once | INTRAVENOUS | Status: AC | PRN
  Administered 2019-01-08: 100 mL via INTRAVENOUS

## 2019-01-08 MED ORDER — ACETAMINOPHEN 325 MG PO TABS: 650.0000 mg | ORAL_TABLET | Freq: Four times a day (QID) | ORAL | Status: DC | PRN

## 2019-01-08 MED ORDER — NALOXONE HCL 0.4 MG/ML IJ SOLN: 0.4000 mg | INTRAMUSCULAR | Status: DC | PRN

## 2019-01-08 MED ORDER — ONDANSETRON HCL 4 MG PO TABS: 4.0000 mg | ORAL_TABLET | Freq: Four times a day (QID) | ORAL | Status: DC | PRN

## 2019-01-08 NOTE — ED Provider Notes (Signed)
[facility] EMERGENCY DEPARTMENT Provider Note   CSN: [id number] Arrival date & time: 01/08/19  1220     History   Chief Complaint Chief Complaint  Patient presents with  . Shortness of Breath    HPI KAYMON LICEA is a 53 y.o. male.     Patient with history of COPD on home oxygen 3 L and previous pulmonary asbestosis presenting with left-sided rib pain.  States this has been ongoing for several weeks became acutely worse over the past 2 or 3 days.  He reports severe pain to his left lateral and inferior rib cage.  He denies any falls or trauma.  He states he has increased shortness of breath and wheezing.  There has been no cough or fever.  He denies any chest pain.  He states the pain became worse about an hour ago once he felt a "a "pop".  He states he has had surgery in the past for asbestosis at [facility].  Does not know if he had a pneumothorax.  Denies any leg pain or leg swelling.  The pain is persistent, constant and progressively worsening.  The history is provided by the patient.  Shortness of Breath Associated symptoms: cough   Associated symptoms: no abdominal pain, no chest pain, no fever, no headaches, no rash and no vomiting     Past Medical History:  Diagnosis Date  . Avascular necrosis of femur ([place])   . Back pain   . COPD (chronic obstructive pulmonary disease) ([place])   . Neuropathy   . On home oxygen therapy    "3L prn" (07/19/2016)  . Osteoporosis   . Pulmonary asbestosis ([place])   . 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 Normal coronary arteries 08/02/2016  . Chest pain 07/20/2016  . Acute coronary syndrome ([place]) 07/19/2016  . Abnormal stress test 07/19/2016  . Unstable angina ([place]) 07/19/2016  . Sepsis ([place]) 03/30/2016  . HCAP (healthcare-associated pneumonia) 03/30/2016  . Hypokalemia 01/08/2016  . History of DVT (deep vein thrombosis) 01/08/2016  . OA  (osteoarthritis) of hip 09/24/2015  . Community acquired pneumonia 04/14/2015  . Vomiting and diarrhea   . Solitary pulmonary nodule 05/18/2014  . Nausea and vomiting 05/16/2014  . Bronchitis, acute, with bronchospasm 02/24/2014  . Osteoporosis, unspecified 08/06/2012  . Hip pain 08/06/2012  . Opiate abuse, continuous ([place]) 01/05/2012  . Opiate dependence ([place]) 01/03/2012  . Chronic respiratory failure ([place]) 01/03/2012  . COPD exacerbation ([place]) 01/03/2012  . Pulmonary infection 01/03/2012  . Pneumonia 07/19/2011  . Fever 07/18/2011  . Myalgia 07/18/2011  . Leukocytosis 07/18/2011  . COPD (chronic obstructive pulmonary disease) ([place]) 07/18/2011  . Acute-on-chronic respiratory failure ([place]) 07/18/2011  . Tachycardia 07/18/2011  . Dehydration 07/18/2011  . Chronic pain 07/18/2011  . Tobacco abuse 07/18/2011    Past Surgical History:  Procedure Laterality Date  . BACK SURGERY    . EYE SURGERY     left eye cornea and lens replaced- blind in left eye  . JOINT REPLACEMENT    . LEFT HEART CATH AND CORONARY ANGIOGRAPHY N/A 07/20/2016   Procedure: Left Heart Cath and Coronary Angiography;  Surgeon: Lorretta Harp, MD;  Location: [facility];  Service: Cardiovascular;  Laterality: N/A;  . LUNG SURGERY    . TOTAL HIP ARTHROPLASTY Right 09/24/2015   Procedure: TOTAL HIP ARTHROPLASTY ANTERIOR APPROACH;  Surgeon: Gaynelle Arabian, MD;  Location: [facility];  Service: Orthopedics;  Laterality: Right;        Home Medications    Prior to Admission medications   Medication Sig Start Date End Date Taking? Authorizing Provider  ADVAIR DISKUS 250-50 MCG/DOSE AEPB Inhale 1 puff into the lungs 2 (two) times daily. 06/14/15   [provider]  albuterol (PROVENTIL HFA;VENTOLIN HFA) 108 (90 Base) MCG/ACT inhaler Inhale 2 puffs into the lungs every 4 (four) hours as needed for wheezing or shortness of breath. Shortness of breath 01/11/16   Isaac Bliss, Rayford Halsted, MD  albuterol  (PROVENTIL) (2.5 MG/3ML) 0.083% nebulizer solution Take 3 mLs (2.5 mg total) by nebulization every 6 (six) hours as needed for wheezing or shortness of breath. Patient taking differently: Take 2.5 mg by nebulization every 4 (four) hours as needed for wheezing or shortness of breath.  02/27/14   Kathie Dike, MD  aspirin 81 MG chewable tablet Chew 1 tablet (81 mg total) by mouth daily. 07/20/16   Cheryln Manly, NP  budesonide-formoterol (SYMBICORT) 160-4.5 MCG/ACT inhaler Inhale 3 puffs into the lungs 4 (four) times daily.    [provider]  furosemide (LASIX) 40 MG tablet Take 40 mg by mouth daily.    [provider]  gabapentin (NEURONTIN) 300 MG capsule Take 600 mg by mouth 3 (three) times daily.     [provider]  montelukast (SINGULAIR) 10 MG tablet Take 1 tablet by mouth daily. 07/07/16   [provider]  naloxone HCl (NARCAN) 4 MG/0.1ML LIQD Place 4 mg into the nose as directed. To prevent overdose    [provider]  nicotine (NICODERM CQ - DOSED IN MG/24 HOURS) 21 mg/24hr patch Place 21 mg onto the skin daily.    [provider]  oxyCODONE (OXYCONTIN) 30 MG 12 hr tablet Take 30 mg by mouth every 8 (eight) hours.    [provider]  oxycodone (ROXICODONE) 30 MG immediate release tablet Take 30 mg by mouth every 4 (four) hours.    [provider]  OXYGEN Inhale 3 L into the lungs daily as needed.    [provider]  Polyethyl Glycol-Propyl Glycol (SYSTANE) 0.4-0.3 % SOLN Place 1-2 drops into the left eye daily as needed (for dry eye relief).     [provider]  tiotropium (SPIRIVA) 18 MCG inhalation capsule Place 1 capsule (18 mcg total) into inhaler and inhale daily. 07/24/11   Doree Albee, MD    Family History Family History  Problem Relation Age of Onset  . Heart failure Father   . Diabetes Father   . Diabetes Mother   . Cancer Mother        male cancer    Social History  Social History   Tobacco Use  . Smoking status: Current Every Day Smoker    Packs/day: 1.00    Last attempt to quit: 07/05/2011    Years since quitting: 7.5  . Smokeless tobacco: Never Used  Substance Use Topics  . Alcohol use: No  . Drug use: No    Types: Oxycodone, Morphine    Comment: takes these medications as prescribed by Physician for pain!     Allergies   Ambien [zolpidem tartrate], Melatonin, and Zolpidem   Review of Systems Review of Systems  Constitutional: Negative for activity change, appetite change and fever.  HENT: Negative for congestion and rhinorrhea.   Eyes: Negative for visual disturbance.  Respiratory: Positive for cough, chest tightness and  shortness of breath.   Cardiovascular: Negative for chest pain.  Gastrointestinal: Negative for abdominal pain, nausea and vomiting.  Genitourinary: Negative for dysuria and hematuria.  Musculoskeletal: Positive for back pain. Negative for arthralgias and myalgias.  Skin: Negative for rash.  Neurological: Negative for dizziness, light-headedness, numbness and headaches.   all other systems are negative except as noted in the HPI and PMH.     Physical Exam Updated Vital Signs BP (!) 123/112 (BP Location: Left Arm)   Pulse 98   Temp 98.4 F (36.9 C) (Oral)   Resp 20   SpO2 98%   Physical Exam Vitals signs and nursing note reviewed.  Constitutional:      General: He is in acute distress.     Appearance: He is well-developed.     Comments: Uncomfortable  HENT:     Head: Normocephalic and atraumatic.     Mouth/Throat:     Pharynx: No oropharyngeal exudate.  Eyes:     Conjunctiva/sclera: Conjunctivae normal.     Pupils: Pupils are equal, round, and reactive to light.  Neck:     Musculoskeletal: Normal range of motion and neck supple.     Comments: No meningismus. Cardiovascular:     Rate and Rhythm: Normal rate and regular rhythm.     Heart sounds: Normal heart sounds. No murmur.  Pulmonary:      Effort: Pulmonary effort is normal. No respiratory distress.     Breath sounds: Wheezing present. No rales.     Comments: Left lateral inferior rib tenderness worse with palpation.  No crepitance or ecchymosis.  Wheezing bilaterally Chest:     Chest wall: Tenderness present.  Abdominal:     Palpations: Abdomen is soft.     Tenderness: There is no abdominal tenderness. There is no guarding or rebound.  Musculoskeletal: Normal range of motion.        General: No tenderness.  Skin:    General: Skin is warm.     Capillary Refill: Capillary refill takes less than 2 seconds.  Neurological:     General: No focal deficit present.     Mental Status: He is alert and oriented to person, place, and time. Mental status is at baseline.     Cranial Nerves: No cranial nerve deficit.     Motor: No abnormal muscle tone.     Coordination: Coordination normal.     Comments: No ataxia on finger to nose bilaterally. No pronator drift. 5/5 strength throughout. CN 2-12 intact.Equal grip strength. Sensation intact.   Psychiatric:        Behavior: Behavior normal.      ED Treatments / Results  Labs (all labs ordered are listed, but only abnormal results are displayed) Labs Reviewed  BASIC METABOLIC PANEL - Abnormal; Notable for the following components:      Result Value   Glucose, Bld 112 (*)    All other components within normal limits  CULTURE, BLOOD (ROUTINE X 2)  CULTURE, BLOOD (ROUTINE X 2)  SARS CORONAVIRUS 2 (TAT 6-24 HRS)  CBC WITH DIFFERENTIAL/PLATELET  D-DIMER, QUANTITATIVE (NOT AT [facility])  BRAIN NATRIURETIC PEPTIDE  LACTIC ACID, PLASMA  HIV ANTIBODY (ROUTINE TESTING W REFLEX)  RAPID URINE DRUG SCREEN, HOSP PERFORMED  MAGNESIUM  CBC  BASIC METABOLIC PANEL  MAGNESIUM  BLOOD GAS, VENOUS  STREP PNEUMONIAE URINARY ANTIGEN  TROPONIN I (HIGH SENSITIVITY)  TROPONIN I (HIGH SENSITIVITY)    EKG EKG Interpretation  Date/Time:  Wednesday January 08 2019 12:37:15 EST Ventricular Rate:  86 PR Interval:  160 QRS Duration: 92 QT Interval:  354 QTC Calculation: 423 R Axis:   94 Text Interpretation: Normal sinus rhythm Rightward axis Pulmonary disease pattern Abnormal ECG No significant change was found Confirmed by Ezequiel Essex [phone]) on 01/08/2019 12:54:20 PM   Radiology Dg Chest 2 View  Result Date: 01/08/2019 CLINICAL DATA:  54 year old male with a history of shortness of breath EXAM: CHEST - 2 VIEW COMPARISON:  01/17/2017, 01/06/2019 FINDINGS: Cardiomediastinal silhouette unchanged in size and contour. No evidence of central vascular congestion. Stigmata of emphysema, with increased retrosternal airspace, flattened hemidiaphragms, increased AP diameter, and hyperinflation on the AP view. Architectural distortion in reticular opacities in the mid lungs bilaterally, similar to the prior including linear opacities at the superior left lung. Developing nodular opacity of the left lateral lung appears slightly worsened from the comparison plain film of [facility] rocking him. No pleural effusion or pneumothorax. No displaced fracture with degenerative changes of the spine IMPRESSION: Advanced emphysema and architectural distortion, with slight worsening of nodular opacity of the left lateral lung concerning for developing pneumonia. Followup PA and lateral chest X-ray is recommended in 3-4 weeks following trial of antibiotic therapy to ensure resolution and exclude underlying malignancy. Electronically Signed   By: Corrie Mckusick D.O.   On: 01/08/2019 13:33   Ct Angio Chest Pe W And/or Wo Contrast  Result Date: 01/08/2019 CLINICAL DATA:  PE suspected EXAM: CT ANGIOGRAPHY CHEST WITH CONTRAST TECHNIQUE: Multidetector CT imaging of the chest was performed using the standard protocol during bolus administration of intravenous contrast. Multiplanar CT image reconstructions and MIPs were obtained to evaluate the vascular anatomy. CONTRAST:  148mL OMNIPAQUE IOHEXOL 350 MG/ML SOLN COMPARISON:   07/28/2016, 04/01/2016 FINDINGS: Cardiovascular: Satisfactory opacification of the pulmonary arteries to the segmental level. No evidence of pulmonary embolism. Normal heart size. No pericardial effusion. Mediastinum/Nodes: No enlarged mediastinal, hilar, or axillary lymph nodes. Thyroid gland, trachea, and esophagus demonstrate no significant findings. Lungs/Pleura: Severe, bullous emphysema. Diffuse bilateral bronchial wall thickening. Stable scarring of the anterior left upper lobe and lingula. Probable bulla reduction surgery bilaterally. No pleural effusion or pneumothorax. Upper Abdomen: No acute abnormality. Unchanged benign, fat containing left adrenal adenoma. Musculoskeletal: No chest wall abnormality. No acute or significant osseous findings. Review of the MIP images confirms the above findings. IMPRESSION: 1.  Negative examination for pulmonary embolism. 2. Diffuse bilateral bronchial wall thickening, consistent with infectious or inflammatory bronchitis. 3.  Severe, bullous emphysema.  Emphysema (ICD10-J43.9). Electronically Signed   By: Eddie Candle M.D.   On: 01/08/2019 16:04    Procedures .Critical Care Performed by: Ezequiel Essex, MD Authorized by: Ezequiel Essex, MD   Critical care provider statement:    Critical care time (minutes):  35   Critical care was necessary to treat or prevent imminent or life-threatening deterioration of the following conditions:  Respiratory failure   Critical care was time spent personally by me on the following activities:  Discussions with consultants, evaluation of patient's response to treatment, examination of patient, ordering and performing treatments and interventions, ordering and review of laboratory studies, ordering and review of radiographic studies, pulse oximetry, re-evaluation of patient's condition, obtaining history from patient or surrogate and review of old charts   (including critical care time)  Medications Ordered in ED  Medications  albuterol (VENTOLIN HFA) 108 (90 Base) MCG/ACT inhaler 4 puff (4 puffs Inhalation Not Given 01/08/19 1254)  albuterol (PROVENTIL) (2.5 MG/3ML) 0.083% nebulizer solution 5 mg (5 mg Nebulization Given 01/08/19 1253)  fentaNYL (SUBLIMAZE) injection 50 mcg (50 mcg Intravenous Given 01/08/19 1314)  methylPREDNISolone sodium succinate (SOLU-MEDROL) 125 mg/2 mL injection 60 mg (60 mg Intravenous Given 01/08/19 1313)     Initial Impression / Assessment and Plan / ED Course  I have reviewed the triage vital signs and the nursing notes.  Pertinent labs & imaging results that were available during my care of the patient were reviewed by me and considered in my medical decision making (see chart for details).       History of severe COPD presenting with rib pain and wheezing.  Breath sounds are equal.  Chest x-ray be obtained to rule out pneumothorax.  Patient given bronchodilators and steroids.  CXR without PTX. Possible pneumonia seen.  Still with severe pleuritic rib pain and SOB. Will proceed with CTPE despite negative D-dimer. Coronavirus swab sent. Antibiotics started.   Still with wheezing and severe pain on recheck. Does take chronic pain meds.  No PE With ongoing wheezing and pain, plan admission  D/w Dr. Velia Meyer.   NIJEE FRISBEY was evaluated in Emergency Department on 01/08/2019 for the symptoms described in the history of present illness. He was evaluated in the context of the global COVID-19 pandemic, which necessitated consideration that the patient might be at risk for infection with the SARS-CoV-2 virus that causes COVID-19. Institutional protocols and algorithms that pertain to the evaluation of patients at risk for COVID-19 are in a state of rapid change based on information released by regulatory bodies including the CDC and federal and state organizations. These policies and algorithms were followed during the patient's care in the ED.  Final Clinical Impressions(s)  / ED Diagnoses   Final diagnoses:  COPD exacerbation [facility])    ED Discharge Orders    None       Ezequiel Essex, MD 01/08/19 1726

## 2019-01-08 NOTE — ED Notes (Signed)
Neurontin being viewed by pharmacy.

## 2019-01-08 NOTE — ED Triage Notes (Signed)
Pt in c/o SOB hx of bil lung operation d/t asbetos, pt states, "I heard something pop about an hour ago, pt reports bruising to the L chest, pt denies injury, pt uses O2 4 L at baseline, pt states, "it is coming from my L side, A&O x4

## 2019-01-08 NOTE — H&P (Signed)
History and Physical    PLEASE NOTE THAT DRAGON DICTATION SOFTWARE WAS USED IN THE CONSTRUCTION OF THIS NOTE.   VERSIE FLEENER CWU:[id number] DOB: 15-Dec-1965 DOA: 01/08/2019  PCP: [name], MD Patient coming from: Home  I have personally briefly reviewed patient's old medical records in [place]  Chief Complaint: Chest pain  HPI: Wesley [name] is a 53 y.o. male with medical history significant for chronic hypoxic respiratory failure on 3 L continuous supplemental oxygen in the setting of severe COPD as well as asbestosis, chronic back pain on chronic opioid therapy, who is admitted to [facility] on 01/08/2019 with community-acquired pneumonia after presenting from home to [facility] emergency department complaining of left-sided chest discomfort.   The patient reports 1 to 2 weeks of progressive left lateral chest discomfort.  He describes the chest pain is nonradiating, sharp discomfort is been constant since onset, and worsens with deep inspiration, cough, or palpation of the left lateral chest wall.  Discomfort has been associated with mild shortness of breath, new onset nonproductive cough, and new onset wheezing.  Denies any associated palpitations, diaphoresis, nausea, vomiting.  Denies any associated orthopnea or PND. he also denies any associated hemoptysis, peripheral edema, or calf tenderness.  Denies any recent trauma, fall, or traveling.  Denies any recent known sick contacts.  Denies any associated subjective fever, chills, rigors, or generalized myalgias.  Denies headache, neck stiffness, rhinitis, rhinorrhea, rash, abdominal pain, or diarrhea.   He acknowledges a history of chronic back pain for which she is on chronic opioid therapy as an outpatient.  He reports that his outpatient opioid regimen consists of scheduled long-acting morphine as well as scheduled short acting oxycodone.  Ever, in spite of this regimen, the patient reports that outpatient pain  control has been inadequate as relates to the left-sided chest discomfort, prompting him to present to [facility] emergency department this evening for further evaluation.   ED Course: Vital signs in the emergency department were notable for the following: Temperature max 98.4; heart rate 89-95, blood pressure ranged from 103/80 1-1 10/53; respiratory rate 15-20, and oxygen saturation 98 to 100% on baseline 3 L continuous nasal cannula.  Labs in the ED today were notable for the following: BMP notable for sodium 139, bicarbonate 28, creatinine 0.85.  High-sensitivity troponin I x1 found to be nonelevated.  D-dimer 0.48.  CBC notable for white blood cell count of 7700 with 72% neutrophils, hemoglobin 13.9.  Lactic acid 1.2.  EKG showed normal sinus rhythm with heart rate 86, nonspecific T wave inversions in aVL and V1, which are unchanged relative to most recent prior EKG from November 2018, and no evidence of ST changes.  Relative to chest x-ray performed on 01/06/2019, chest x-ray shows evidence of advanced emphysema with interval development of left lower lobe opacity concerning for developing pneumonia.  CTA of the chest showed no evidence of acute pulmonary embolism as well as no evidence of aortic dissection/aneurysm.  While in the ED, the following were administered: Albuterol nebulizer, fentanyl 50 mcg IV x1, Dilaudid 1 mg IV x1, Solu-Medrol 60 mg IV x1, Rocephin 1 g IV x1, azithromycin 500 mg IV x1, and the patient received 2 g of IV magnesium sulfate.  Subsequently, the patient was admitted to the med telemetry floor for further evaluation management of presenting community-acquired pneumonia.    Review of Systems: As per HPI otherwise 10 point review of systems negative.   Past Medical History:  Diagnosis Date  Avascular necrosis of femur (HCC)    Back pain    COPD (chronic obstructive pulmonary disease) (HCC)    Neuropathy    On home oxygen therapy    "3L prn" (07/19/2016)    Osteoporosis    Pulmonary asbestosis ([place])    Tumor of lung    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, 2017    Past Surgical History:  Procedure Laterality Date   BACK SURGERY     EYE SURGERY     left eye cornea and lens replaced- blind in left eye   JOINT REPLACEMENT     LEFT HEART CATH AND CORONARY ANGIOGRAPHY N/A 07/20/2016   Procedure: Left Heart Cath and Coronary Angiography;  Surgeon: Lorretta Harp, MD;  Location: [facility];  Service: Cardiovascular;  Laterality: N/A;   LUNG SURGERY     TOTAL HIP ARTHROPLASTY Right 09/24/2015   Procedure: TOTAL HIP ARTHROPLASTY ANTERIOR APPROACH;  Surgeon: Gaynelle Arabian, MD;  Location: [facility];  Service: Orthopedics;  Laterality: Right;    Social History:  reports that he has been smoking. He has been smoking about 1.00 pack per day. He has never used smokeless tobacco. He reports that he does not drink alcohol or use drugs.   Allergies  Allergen Reactions   Ambien [Zolpidem Tartrate] Other (See Comments)    hallucinations   Melatonin Itching   Zolpidem Other (See Comments)    Altered mental status    Family History  Problem Relation Age of Onset   Heart failure Father    Diabetes Father    Diabetes Mother    Cancer Mother        male cancer    Prior to Admission medications   Medication Sig Start Date End Date Taking? Authorizing Provider  acetaminophen (TYLENOL) 325 MG tablet Take 2 tablets by mouth every 4 (four) hours.   Yes [provider]  ADVAIR DISKUS 250-50 MCG/DOSE AEPB Inhale 1 puff into the lungs 2 (two) times daily. 06/14/15  Yes [provider]  albuterol (PROVENTIL HFA;VENTOLIN HFA) 108 (90 Base) MCG/ACT inhaler Inhale 2 puffs into the lungs every 4 (four) hours as needed for wheezing or shortness of breath. Shortness of breath 01/11/16  Yes Isaac Bliss, Rayford Halsted, MD  albuterol (PROVENTIL) (2.5 MG/3ML) 0.083% nebulizer solution Take 3 mLs (2.5 mg total)  by nebulization every 6 (six) hours as needed for wheezing or shortness of breath. Patient taking differently: Take 2.5 mg by nebulization every 4 (four) hours as needed for wheezing or shortness of breath.  02/27/14  Yes Kathie Dike, MD  aspirin 81 MG chewable tablet Chew 1 tablet (81 mg total) by mouth daily. 07/20/16  Yes Cheryln Manly, NP  budesonide-formoterol (SYMBICORT) 160-4.5 MCG/ACT inhaler Inhale 3 puffs into the lungs 4 (four) times daily.   Yes [provider]  furosemide (LASIX) 40 MG tablet Take 40 mg by mouth daily.   Yes [provider]  INCRUSE ELLIPTA 62.5 MCG/INH AEPB Inhale 1 puff into the lungs daily. 12/06/18  Yes [provider]  loperamide (IMODIUM) 2 MG capsule Take 1 capsule by mouth 4 (four) times daily.   Yes [provider]  montelukast (SINGULAIR) 10 MG tablet Take 1 tablet by mouth daily. 07/07/16  Yes [provider]  morphine (MS CONTIN) 30 MG 12 hr tablet Take 30 mg by mouth every 8 (eight) hours. 12/30/18  Yes [provider]  mupirocin ointment (BACTROBAN) 2 % Place 1  application into the nose daily. 12/03/18  Yes [provider]  naloxone HCl (NARCAN) 4 MG/0.1ML LIQD Place 4 mg into the nose as directed. To prevent overdose   Yes [provider]  ondansetron (ZOFRAN-ODT) 4 MG disintegrating tablet Take 1 tablet by mouth every 8 (eight) hours as needed. 09/05/18  Yes [provider]  oxyCODONE (OXYCONTIN) 30 MG 12 hr tablet Take 30 mg by mouth every 8 (eight) hours.   Yes [provider]  oxycodone (ROXICODONE) 30 MG immediate release tablet Take 30 mg by mouth every 4 (four) hours.   Yes [provider]  penicillin v potassium (VEETID) 500 MG tablet Take 500 mg by mouth 3 (three) times daily. 12/03/18  Yes [provider]  Polyethyl Glycol-Propyl Glycol (SYSTANE) 0.4-0.3 % SOLN Place 1-2 drops into the left eye daily as needed (for dry eye relief).    Yes  [provider]  pregabalin (LYRICA) 150 MG capsule Take 1 capsule by mouth 3 (three) times daily. 12/30/18  Yes [provider]  tiotropium (SPIRIVA) 18 MCG inhalation capsule Place 1 capsule (18 mcg total) into inhaler and inhale daily. 07/24/11  Yes Gosrani, Nimish C, MD  gabapentin (NEURONTIN) 300 MG capsule Take 600 mg by mouth 3 (three) times daily.     [provider]    Objective    Physical Exam: Vitals:   01/08/19 1330 01/08/19 1345 01/08/19 1400 01/08/19 1430  BP:    (!) 89/75  Pulse: 93 93 83 92  Resp: _0 Temp:      TempSrc:      SpO2: 98% 99% 100% 98%    General: appears to be stated age; alert, oriented Skin: warm, dry Head:  AT/[place] Eyes:  PEARL b/l, EOMI Mouth:  Oral mucosa membranes appear dry, normal dentition Neck: supple; trachea midline Chest: tenderness to palpation over the left lateral chest wall;  Heart:  RRR; did not appreciate any M/R/G Lungs: Mild wheezing throughout all lung fields bilaterally in the absence of any associated rales. Abdomen: + BS; soft, ND, NT Vascular: 2+ pedal pulses b/l; 2+ radial pulses b/l Extremities: no peripheral edema, no muscle wasting  Labs on Admission: I have personally reviewed following labs and imaging studies  CBC: Recent Labs  Lab 01/08/19 1249  WBC 7.7  NEUTROABS 5.5  HGB 13.9  HCT 43.9  MCV 99.3  PLT 818   Basic Metabolic Panel: Recent Labs  Lab 01/08/19 1249  NA 139  K 3.9  CL 102  CO2 28  GLUCOSE 112*  BUN 11  CREATININE 0.87  CALCIUM 9.2   GFR: CrCl cannot be calculated (Unknown ideal weight.). Liver Function Tests: No results for input(s): AST, ALT, ALKPHOS, BILITOT, PROT, ALBUMIN in the last 168 hours. No results for input(s): LIPASE, AMYLASE in the last 168 hours. No results for input(s): AMMONIA in the last 168 hours. Coagulation Profile: No results for input(s): INR, PROTIME in the last 168 hours. Cardiac Enzymes: No results for input(s):  CKTOTAL, CKMB, CKMBINDEX, TROPONINI in the last 168 hours. BNP (last 3 results) No results for input(s): PROBNP in the last 8760 hours. HbA1C: No results for input(s): HGBA1C in the last 72 hours. CBG: No results for input(s): GLUCAP in the last 168 hours. Lipid Profile: No results for input(s): CHOL, HDL, LDLCALC, TRIG, CHOLHDL, LDLDIRECT in the last 72 hours. Thyroid Function Tests: No results for input(s): TSH, T4TOTAL, FREET4, T3FREE, THYROIDAB in the last 72 hours. Anemia Panel: No results  for input(s): VITAMINB12, FOLATE, FERRITIN, TIBC, IRON, RETICCTPCT in the last 72 hours. Urine analysis:    Component Value Date/Time   COLORURINE YELLOW 01/17/2017 0210   APPEARANCEUR CLEAR 01/17/2017 0210   LABSPEC >1.046 (H) 01/17/2017 0210   PHURINE 5.0 01/17/2017 0210   GLUCOSEU NEGATIVE 01/17/2017 0210   HGBUR NEGATIVE 01/17/2017 0210   BILIRUBINUR NEGATIVE 01/17/2017 0210   KETONESUR NEGATIVE 01/17/2017 0210   PROTEINUR NEGATIVE 01/17/2017 0210   UROBILINOGEN 0.2 05/23/2014 0102   NITRITE NEGATIVE 01/17/2017 0210   LEUKOCYTESUR NEGATIVE 01/17/2017 0210    Radiological Exams on Admission: Dg Chest 2 View  Result Date: 01/08/2019 CLINICAL DATA:  53 year old male with a history of shortness of breath EXAM: CHEST - 2 VIEW COMPARISON:  01/17/2017, 01/06/2019 FINDINGS: Cardiomediastinal silhouette unchanged in size and contour. No evidence of central vascular congestion. Stigmata of emphysema, with increased retrosternal airspace, flattened hemidiaphragms, increased AP diameter, and hyperinflation on the AP view. Architectural distortion in reticular opacities in the mid lungs bilaterally, similar to the prior including linear opacities at the superior left lung. Developing nodular opacity of the left lateral lung appears slightly worsened from the comparison plain film of [facility] rocking him. No pleural effusion or pneumothorax. No displaced fracture with degenerative changes of the spine  IMPRESSION: Advanced emphysema and architectural distortion, with slight worsening of nodular opacity of the left lateral lung concerning for developing pneumonia. Followup PA and lateral chest X-ray is recommended in 3-4 weeks following trial of antibiotic therapy to ensure resolution and exclude underlying malignancy. Electronically Signed   By: Corrie Mckusick D.O.   On: 01/08/2019 13:33   Ct Angio Chest Pe W And/or Wo Contrast  Result Date: 01/08/2019 CLINICAL DATA:  PE suspected EXAM: CT ANGIOGRAPHY CHEST WITH CONTRAST TECHNIQUE: Multidetector CT imaging of the chest was performed using the standard protocol during bolus administration of intravenous contrast. Multiplanar CT image reconstructions and MIPs were obtained to evaluate the vascular anatomy. CONTRAST:  144m OMNIPAQUE IOHEXOL 350 MG/ML SOLN COMPARISON:  07/28/2016, 04/01/2016 FINDINGS: Cardiovascular: Satisfactory opacification of the pulmonary arteries to the segmental level. No evidence of pulmonary embolism. Normal heart size. No pericardial effusion. Mediastinum/Nodes: No enlarged mediastinal, hilar, or axillary lymph nodes. Thyroid gland, trachea, and esophagus demonstrate no significant findings. Lungs/Pleura: Severe, bullous emphysema. Diffuse bilateral bronchial wall thickening. Stable scarring of the anterior left upper lobe and lingula. Probable bulla reduction surgery bilaterally. No pleural effusion or pneumothorax. Upper Abdomen: No acute abnormality. Unchanged benign, fat containing left adrenal adenoma. Musculoskeletal: No chest wall abnormality. No acute or significant osseous findings. Review of the MIP images confirms the above findings. IMPRESSION: 1.  Negative examination for pulmonary embolism. 2. Diffuse bilateral bronchial wall thickening, consistent with infectious or inflammatory bronchitis. 3.  Severe, bullous emphysema.  Emphysema (ICD10-J43.9). Electronically Signed   By: AEddie CandleM.D.   On: 01/08/2019 16:04     EKG: Independently reviewed, with result as described above.   Assessment/Plan   WMELVEN STOCKARDis a 53y.o. male with medical history significant for chronic hypoxic respiratory failure on 3 L continuous supplemental oxygen in the setting of severe COPD as well as asbestosis, chronic back pain on chronic opioid therapy, [facility]    Principal Problem:   CAP (community acquired pneumonia) Active Problems:   COPD (chronic obstructive pulmonary disease) (HCC)   Chronic pain  Atypical chest pain   SOB (shortness of breath)   #) Community Acquired Pneumonia: Diagnosis on the basis of 1 week of progressive left-sided pleuritic chest pain associated with shortness of breath and new onset nonproductive cough, with presenting chest x-ray showing evidence of interval development of left lower lobe infiltrate concerning for pneumonia.  SIRS criteria are not met for patient to be considered septic at this time.  Presenting lactic acid not elevated, while presentation has not been associated with any hypotension.  Blood cultures x2 collected in the ED this evening, following which the patient has received Rocephin and a azithromycin for CAP coverage.  Of note, nasopharyngeal COVID-19 swab performed in the ED this evening has been found to be negative.   Plan: Continue Rocephin and azithromycin.  Will monitor for results blood cultures x2 collected in the emergency department this evening.  Repeat CBC with differential in the morning.  Continue home chronic 3 L continuous supplemental oxygen, with as needed titration in order to maintain oxygen saturations greater than or equal to 90%.  As needed IV Toradol for pleuritic chest discomfort.  Droplet precautions.  Check strep pneumonia urine antigen.    #) Atypical chest  pain: 1 week of progressive left lateral chest discomfort that worsens with deep inspiration as well as cough, and is reproducible with palpation of the left lateral chest wall.  Appears to be consistent with presenting left lower lobe pneumonia given the pleuritic nature of patient's chest discomfort located along the left lateral aspect of the chest.  There may also be a additional musculoskeletal contribution from slight increase in work of breathing associated with presenting pneumonia.  Presentation is atypical for ACS, and EKG shows no evidence of acute ischemic changes, clear no evidence of ST elevation.  Furthermore, high-sensitivity troponin I found to be nonelevated, which is particularly reassuring given the patient's report of 1 week duration of the chest discomfort.  Also, CTA of the chest performed this evening demonstrated no evidence of acute pulmonary embolism, and showed no evidence of aortic dissection/aneurysm or pneumothorax.  The patient is adamant that he receive IV pain medication to improve pain control relating to left lateral pleuritic chest discomfort.  In the context of chronic opioid therapy as an outpatient, in which the patient reportedly is on scheduled long-acting morphine as well as scheduled short acting oxycodone, I conveyed to the patient that I will continue his outpatient scheduled long-acting morphine, but did not feel comfortable continuing his scheduled short acting oxycodone while providing as needed IV opioids.  The patient verbalizes understanding of this approach, and is amenable to proceeding in this fashion.  Plan: Work-up and management of presenting community-acquired pneumonia, as further described above.  Continue home scheduled long-acting morphine.  As needed IV Dilaudid.  Will hold home scheduled short acting oxycodone while receiving IV Dilaudid, as described above.  Given the pleuritic nature with potential additional musculoskeletal contributions, I have  also ordered as needed IV Toradol.     #) Chronic hypoxic respiratory failure: In the setting of severe COPD as well as documented history of asbestosis, the patient is on 3 L continuous supplemental oxygen as an outpatient.  He reports good compliance with his outpatient respiratory regimen which consists of Advair, Spiriva, and as needed albuterol.  He confirms that he is a former smoker.  No evidence of acutely elevated supplemental oxygen demands relative to baseline requirements.  While there is not overt evidence of a full-blown acute COPD exacerbation, the patient may  be in the preliminary stages of such, and certainly has increased risk of developing an acute exacerbation of his COPD in the setting of presenting commune acquired pneumonia.  Additionally, my auscultatory exam reveals diffuse bilateral wheezing, which the patient reports is new for him over the course of the last several days.  As result, I will continue the Solu-Medrol that was initiated in the emergency department this evening, although this can likely be transitioned to prednisone in the morning.  Plan: Solu-Medrol 40 mg IV every 6 hours, with potential conversion to prednisone in the morning.  Continue home Advair as well as Spiriva.  As needed albuterol inhaler.  Work-up and management of presenting left lower lobe pneumonia, including antibiotic administration, as above.  Will check VBG to assess for hypercapnia.  Continue home baseline 3 L continuous supplemental oxygen, with as needed titration in order to maintain oxygen saturations greater than or equal to 90%.     #) Chronic back pain: On chronic opioid therapy, taking scheduled long-acting morphine as well as scheduled short acting oxycodone as an outpatient.  No evidence of exacerbation of chronic back pain, although the patient does appear to be experiencing progressive left lateral chest discomfort in the setting of pneumonia and musculoskeletal compensatory factors,  as further described above.  As described above, and as discussed with the patient this evening, will continue home scheduled long-acting morphine, but will hold home scheduled short acting oxycodone while he is receiving as needed IV Dilaudid.  Plan: Continue home scheduled long-acting morphine.  As needed IV Dilaudid for improved pain control as relates to left lateral chest discomfort, as described above.  Holding home scheduled short acting oxycodone while receiving IV Dilaudid.  As needed IV Narcan has been ordered.    DVT prophylaxis: Lovenox 40 mg subcu daily Code Status: Full code Family Communication: None Disposition Plan:  Per Rounding Team Consults called: None Admission status: Inpatient; med telemetry   PLEASE NOTE THAT DRAGON DICTATION SOFTWARE WAS USED IN THE CONSTRUCTION OF THIS NOTE.   [facility] Pager [phone] From 3PM- 11PM.   Otherwise, please contact night-coverage  [email or web address] Password [email or web address]  01/08/2019, 5:08 PM

## 2019-01-08 NOTE — ED Notes (Signed)
Denies injury to ribs.

## 2019-01-09 DIAGNOSIS — R0602 Shortness of breath: Secondary | ICD-10-CM | POA: Diagnosis present

## 2019-01-09 DIAGNOSIS — J441 Chronic obstructive pulmonary disease with (acute) exacerbation: Secondary | ICD-10-CM

## 2019-01-09 DIAGNOSIS — J9611 Chronic respiratory failure with hypoxia: Secondary | ICD-10-CM

## 2019-01-09 DIAGNOSIS — G894 Chronic pain syndrome: Secondary | ICD-10-CM

## 2019-01-09 LAB — MAGNESIUM: Magnesium: 2.6 mg/dL — ABNORMAL HIGH (ref 1.7–2.4)

## 2019-01-09 LAB — BLOOD GAS, VENOUS
Acid-Base Excess: 5.2 mmol/L — ABNORMAL HIGH (ref 0.0–2.0)
Bicarbonate: 26.5 mmol/L (ref 20.0–28.0)
FIO2: 99
O2 Saturation: 51.4 %
Patient temperature: 36.8
pCO2, Ven: 65.8 mmHg — ABNORMAL HIGH (ref 44.0–60.0)
pH, Ven: 7.298 (ref 7.250–7.430)
pO2, Ven: 31.2 mmHg — CL (ref 32.0–45.0)

## 2019-01-09 LAB — RAPID URINE DRUG SCREEN, HOSP PERFORMED
Amphetamines: NOT DETECTED
Barbiturates: NOT DETECTED
Benzodiazepines: NOT DETECTED
Cocaine: NOT DETECTED
Opiates: POSITIVE — AB
Tetrahydrocannabinol: NOT DETECTED

## 2019-01-09 LAB — CBC
HCT: 40.1 % (ref 39.0–52.0)
Hemoglobin: 12.5 g/dL — ABNORMAL LOW (ref 13.0–17.0)
MCH: 31.5 pg (ref 26.0–34.0)
MCHC: 31.2 g/dL (ref 30.0–36.0)
MCV: 101 fL — ABNORMAL HIGH (ref 80.0–100.0)
Platelets: 308 10*3/uL (ref 150–400)
RBC: 3.97 MIL/uL — ABNORMAL LOW (ref 4.22–5.81)
RDW: 11.8 % (ref 11.5–15.5)
WBC: 8 10*3/uL (ref 4.0–10.5)
nRBC: 0 % (ref 0.0–0.2)

## 2019-01-09 LAB — BASIC METABOLIC PANEL
Anion gap: 5 (ref 5–15)
BUN: 14 mg/dL (ref 6–20)
CO2: 28 mmol/L (ref 22–32)
Calcium: 8.6 mg/dL — ABNORMAL LOW (ref 8.9–10.3)
Chloride: 104 mmol/L (ref 98–111)
Creatinine, Ser: 0.7 mg/dL (ref 0.61–1.24)
GFR calc Af Amer: >60 mL/min (ref >60)
GFR calc non Af Amer: >60 mL/min (ref >60)
Glucose, Bld: 136 mg/dL — ABNORMAL HIGH (ref 70–99)
Potassium: 5.1 mmol/L (ref 3.5–5.1)
Sodium: 137 mmol/L (ref 135–145)

## 2019-01-09 LAB — HIV ANTIBODY (ROUTINE TESTING W REFLEX): HIV Screen 4th Generation wRfx: NONREACTIVE

## 2019-01-09 LAB — SARS CORONAVIRUS 2 (TAT 6-24 HRS): SARS Coronavirus 2: NEGATIVE

## 2019-01-09 LAB — STREP PNEUMONIAE URINARY ANTIGEN: Strep Pneumo Urinary Antigen: NEGATIVE

## 2019-01-09 LAB — PROCALCITONIN: Procalcitonin: <0.1 ng/mL

## 2019-01-09 MED ORDER — IPRATROPIUM-ALBUTEROL 0.5-2.5 (3) MG/3ML IN SOLN
3.0000 mL | Freq: Four times a day (QID) | RESPIRATORY_TRACT | Status: DC
  Administered 2019-01-09 – 2019-01-14 (×21): 3 mL via RESPIRATORY_TRACT
  Filled 2019-01-09 (×21): qty 3

## 2019-01-09 MED ORDER — ARFORMOTEROL TARTRATE 15 MCG/2ML IN NEBU
15.0000 ug | INHALATION_SOLUTION | Freq: Two times a day (BID) | RESPIRATORY_TRACT | Status: DC
  Administered 2019-01-09 – 2019-01-14 (×10): 15 ug via RESPIRATORY_TRACT
  Filled 2019-01-09 (×11): qty 2

## 2019-01-09 MED ORDER — INFLUENZA VAC SPLIT QUAD 0.5 ML IM SUSY
0.5000 mL | PREFILLED_SYRINGE | INTRAMUSCULAR | Status: AC
  Administered 2019-01-13: 0.5 mL via INTRAMUSCULAR
  Filled 2019-01-09: qty 0.5

## 2019-01-09 MED ORDER — PNEUMOCOCCAL VAC POLYVALENT 25 MCG/0.5ML IJ INJ
0.5000 mL | INJECTION | INTRAMUSCULAR | Status: DC
  Filled 2019-01-09: qty 0.5

## 2019-01-09 MED ORDER — ENSURE ENLIVE PO LIQD
237.0000 mL | Freq: Two times a day (BID) | ORAL | Status: DC
  Administered 2019-01-09 – 2019-01-14 (×11): 237 mL via ORAL

## 2019-01-09 MED ORDER — METHOCARBAMOL 500 MG PO TABS
500.0000 mg | ORAL_TABLET | Freq: Four times a day (QID) | ORAL | Status: DC
  Administered 2019-01-09 – 2019-01-14 (×22): 500 mg via ORAL
  Filled 2019-01-09 (×22): qty 1

## 2019-01-09 MED ORDER — NICOTINE 14 MG/24HR TD PT24
14.0000 mg | MEDICATED_PATCH | Freq: Every day | TRANSDERMAL | Status: DC
  Administered 2019-01-09 – 2019-01-14 (×6): 14 mg via TRANSDERMAL
  Filled 2019-01-09 (×6): qty 1

## 2019-01-09 MED ORDER — BUDESONIDE 0.5 MG/2ML IN SUSP
0.5000 mg | Freq: Two times a day (BID) | RESPIRATORY_TRACT | Status: DC
  Administered 2019-01-09 – 2019-01-14 (×10): 0.5 mg via RESPIRATORY_TRACT
  Filled 2019-01-09 (×11): qty 2

## 2019-01-09 MED ORDER — METHOCARBAMOL 500 MG PO TABS: 500.0000 mg | ORAL_TABLET | Freq: Three times a day (TID) | ORAL | Status: DC

## 2019-01-09 NOTE — ED Notes (Signed)
Date and time results received: 01/09/19 0135 (use smartphrase ".now" to insert current time)  Test: VBG PO2 Critical Value: 31.2  Name of Provider Notified: Dr Scherrie November  Orders Received? Or Actions Taken?: Actions Taken: no orders received

## 2019-01-09 NOTE — ED Notes (Signed)
Have given pt a meal tray. Is requesting pain medication

## 2019-01-09 NOTE — ED Notes (Signed)
Have called pharmacy to restock pain medication

## 2019-01-09 NOTE — Progress Notes (Signed)
PROGRESS NOTE  Wesley [name] [id number] DOB: 12/23/1965 DOA: 01/08/2019 PCP: [name], MD  Brief History:  53 year old male with a history of chronic respiratory failure on 3 L, COPD, asbestosis, chronic back pain on chronic opioids presenting with left-sided chest pain for the last 3 weeks.  He describes it as a sharp constant and severe worsening with coughing and inspiration.  He denies any recent injuries or falls.  He denies any hemoptysis but has been having coughing with yellow-brown sputum.  He denies any fevers, chills, nausea, vomiting diarrhea, abdominal pain, dysuria, hematuria.  He states that he has been taking some over-the-counter pain relievers, but is not clear whether he has been taking acetaminophen or NSAIDs.  He denies overusing his opioids at home.  He has had some mild worsening of his shortness of breath and wheezing over the past 2 to 3 days.  He smokes electronic cigarettes for the last 4 years.  However he is exposed to his wife's tobacco smoking at home.  He denies any diaphoresis, worsening lower extreme edema, orthopnea, PND. In the emergency department, the patient was afebrile hemodynamically stable with oxygen saturation 96 to 100% on 3 L.  BMP and CBC were essentially unremarkable.  Magnesium 2.6.  CT angiogram of the chest was negative for pulmonary embolus.  It showed severe bullous emphysema with diffuse bilateral bronchial wall thickening.  There was evidence of bilateral bulla reduction.  Chest x-ray showed slight worsening of nodular opacity in the left lateral lung.  The patient was started on ceftriaxone and azithromycin.  Assessment/Plan: Community-acquired pneumonia -Continue ceftriaxone and azithromycin -Check procalcitonin -Urine Legionella antigen -Urine Streptococcus pneumoniae antigen  Atypical chest pain -Likely combination of muscular skeletal strain as well as viscerosomatic response from his pneumonia -Troponins negative  x2 -07/18/2016 heart catheterization--normal coronaries -Personally reviewed EKG--sinus rhythm, nonspecific T wave changes -Start Robaxin  COPD exacerbation -Start Pulmicort -Continue duo nebs -Start Brovana -viral respiratory panel -continue IV solumedrol  Chronic pain syndrome -Continue home dose of MS Contin -Holding home dose of oxycodone while patient is receiving IV Dilaudid -reviewed PMP AWARE--patient receives monthly MS Contin 30 mg #90, oxycodone 30 mg #90, and Lyrica 150 mg #90 from one provider  Chronic respiratory failure with hypoxia -Chronically on 3 L nasal cannula       Disposition Plan:   Home in 1-2 days  Family Communication:   No Family at bedside  Consultants: none   Code Status:  FULL   DVT Prophylaxis:  [place] Lovenox   Procedures: As Listed in Progress Note Above  Antibiotics: Ceftriaxone 11/4>>> azithro 11/4>>>     Subjective: Pt continues to complain about left side chest pain.  Sob is about same.  Denies f/c, hemoptysis, n/v/d, abd pain, dysuria, headache, dysuria  Objective: Vitals:   01/09/19 0230 01/09/19 0700 01/09/19 0730 01/09/19 0830  BP: (!) 93/31 110/60 (!) 117/51 (!) 124/111  Pulse: 70 73 71 85  Resp:  15 11 12   Temp:      TempSrc:      SpO2: 100% 98% 98% 99%    Intake/Output Summary (Last 24 hours) at 01/09/2019 0859 Last data filed at 01/09/2019 0650 Gross per 24 hour  Intake 400 ml  Output 1200 ml  Net -800 ml   Weight change:  Exam:   General:  Pt is alert, follows commands appropriately, not in acute distress  HEENT: No icterus, No thrush, No neck mass, [place]/AT  Cardiovascular: RRR, S1/S2, no rubs, no gallops  Respiratory: diminished BS bilateral.  Bilateral wheeze  Abdomen: Soft/+BS, non tender, non distended, no guarding  Extremities: No edema, No lymphangitis, No petechiae, No rashes, no synovitis   Data Reviewed: I have personally reviewed following labs and imaging studies Basic Metabolic  Panel: Recent Labs  Lab 01/08/19 1249 01/08/19 1440 01/09/19 0118  NA 139  --  137  K 3.9  --  5.1  CL 102  --  104  CO2 28  --  28  GLUCOSE 112*  --  136*  BUN 11  --  14  CREATININE 0.87  --  0.70  CALCIUM 9.2  --  8.6*  MG  --  2.8* 2.6*   Liver Function Tests: No results for input(s): AST, ALT, ALKPHOS, BILITOT, PROT, ALBUMIN in the last 168 hours. No results for input(s): LIPASE, AMYLASE in the last 168 hours. No results for input(s): AMMONIA in the last 168 hours. Coagulation Profile: No results for input(s): INR, PROTIME in the last 168 hours. CBC: Recent Labs  Lab 01/08/19 1249 01/09/19 0118  WBC 7.7 8.0  NEUTROABS 5.5  --   HGB 13.9 12.5*  HCT 43.9 40.1  MCV 99.3 101.0*  PLT 350 308   Cardiac Enzymes: No results for input(s): CKTOTAL, CKMB, CKMBINDEX, TROPONINI in the last 168 hours. BNP: Invalid input(s): POCBNP CBG: No results for input(s): GLUCAP in the last 168 hours. HbA1C: No results for input(s): HGBA1C in the last 72 hours. Urine analysis:    Component Value Date/Time   COLORURINE YELLOW 01/17/2017 0210   APPEARANCEUR CLEAR 01/17/2017 0210   LABSPEC >1.046 (H) 01/17/2017 0210   PHURINE 5.0 01/17/2017 0210   GLUCOSEU NEGATIVE 01/17/2017 0210   HGBUR NEGATIVE 01/17/2017 0210   BILIRUBINUR NEGATIVE 01/17/2017 0210   KETONESUR NEGATIVE 01/17/2017 0210   PROTEINUR NEGATIVE 01/17/2017 0210   UROBILINOGEN 0.2 05/23/2014 0102   NITRITE NEGATIVE 01/17/2017 0210   LEUKOCYTESUR NEGATIVE 01/17/2017 0210   Sepsis Labs: @LABRCNTIP (procalcitonin:4,lacticidven:4) ) Recent Results (from the past 240 hour(s))  SARS CORONAVIRUS 2 (Kamela Blansett 6-24 HRS) Nasopharyngeal Nasopharyngeal Swab     Status: None   Collection Time: 01/08/19  1:43 PM   Specimen: Nasopharyngeal Swab  Result Value Ref Range Status   SARS Coronavirus 2 NEGATIVE NEGATIVE Final    Comment: (NOTE) SARS-CoV-2 target nucleic acids are NOT DETECTED. The SARS-CoV-2 RNA is generally detectable in  upper and lower respiratory specimens during the acute phase of infection. Negative results do not preclude SARS-CoV-2 infection, do not rule out co-infections with other pathogens, and should not be used as the sole basis for treatment or other patient management decisions. Negative results must be combined with clinical observations, patient history, and epidemiological information. The expected result is Negative. Fact Sheet for Patients: SugarRoll.be Fact Sheet for Healthcare Providers: https://www.woods-mathews.com/ This test is not yet approved or cleared by the [place] FDA and  has been authorized for detection and/or diagnosis of SARS-CoV-2 by FDA under an Emergency Use Authorization (EUA). This EUA will remain  in effect (meaning this test can be used) for the duration of the COVID-19 declaration under Section 56 4(b)(1) of the Act, 21 U.S.C. section 360bbb-3(b)(1), unless the authorization is terminated or revoked sooner. Performed at [facility], [place] [address], [place], [place] [address]   Blood culture (routine x 2)     Status: None (Preliminary result)   Collection Time: 01/08/19  2:40 PM   Specimen: BLOOD LEFT ARM  Result Value Ref Range Status   Specimen Description BLOOD LEFT ARM  Final   Special Requests   Final    BOTTLES DRAWN AEROBIC AND ANAEROBIC Blood Culture adequate volume   Culture   Final    NO GROWTH < 24 HOURS Performed at [facility], [address] [place], [place] [address]    Report Status PENDING  Incomplete  Blood culture (routine x 2)     Status: None (Preliminary result)   Collection Time: 01/08/19  2:41 PM   Specimen: BLOOD RIGHT ARM  Result Value Ref Range Status   Specimen Description BLOOD RIGHT ARM  Final   Special Requests   Final    BOTTLES DRAWN AEROBIC AND ANAEROBIC Blood Culture adequate volume   Culture   Final    NO GROWTH < 24 HOURS Performed at [facility], [address] [place], [place] [address]    Report Status PENDING  Incomplete     Scheduled Meds: . aspirin  81 mg Oral Daily  . enoxaparin (LOVENOX) injection  40 mg Subcutaneous Q24H  . methylPREDNISolone (SOLU-MEDROL) injection  40 mg Intravenous Q6H  . mometasone-formoterol  2 puff Inhalation BID  . montelukast  10 mg Oral Daily  . morphine  30 mg Oral Q8H  . pregabalin  150 mg Oral TID   Continuous Infusions: . azithromycin    . cefTRIAXone (ROCEPHIN)  IV      Procedures/Studies: Dg Chest 2 View  Result Date: 01/08/2019 CLINICAL DATA:  53 year old male with a history of shortness of breath EXAM: CHEST - 2 VIEW COMPARISON:  01/17/2017, 01/06/2019 FINDINGS: Cardiomediastinal silhouette unchanged in size and contour. No evidence of central vascular congestion. Stigmata of emphysema, with increased retrosternal airspace, flattened hemidiaphragms, increased AP diameter, and hyperinflation on the AP view. Architectural distortion in reticular opacities in the mid lungs bilaterally, similar to the prior including linear opacities at the superior left lung. Developing nodular opacity of the left lateral lung appears slightly worsened from the comparison plain film of [facility] rocking him. No pleural effusion or pneumothorax. No displaced fracture with degenerative changes of the spine IMPRESSION: Advanced emphysema and architectural distortion, with slight worsening of nodular opacity of the left lateral lung concerning for developing pneumonia. Followup PA and lateral chest X-ray is recommended in 3-4 weeks following trial of antibiotic therapy to ensure resolution and exclude underlying malignancy. Electronically Signed   By: Corrie Mckusick D.O.   On: 01/08/2019 13:33   Ct Angio Chest Pe W And/or Wo Contrast  Result Date: 01/08/2019 CLINICAL DATA:  PE suspected EXAM: CT ANGIOGRAPHY CHEST WITH CONTRAST TECHNIQUE: Multidetector CT imaging of the chest was performed using the standard protocol during bolus  administration of intravenous contrast. Multiplanar CT image reconstructions and MIPs were obtained to evaluate the vascular anatomy. CONTRAST:  150mL OMNIPAQUE IOHEXOL 350 MG/ML SOLN COMPARISON:  07/28/2016, 04/01/2016 FINDINGS: Cardiovascular: Satisfactory opacification of the pulmonary arteries to the segmental level. No evidence of pulmonary embolism. Normal heart size. No pericardial effusion. Mediastinum/Nodes: No enlarged mediastinal, hilar, or axillary lymph nodes. Thyroid gland, trachea, and esophagus demonstrate no significant findings. Lungs/Pleura: Severe, bullous emphysema. Diffuse bilateral bronchial wall thickening. Stable scarring of the anterior left upper lobe and lingula. Probable bulla reduction surgery bilaterally. No pleural effusion or pneumothorax. Upper Abdomen: No acute abnormality. Unchanged benign, fat containing left adrenal adenoma. Musculoskeletal: No chest wall abnormality. No acute or significant osseous findings. Review of the MIP images confirms the above findings. IMPRESSION: 1.  Negative examination for  pulmonary embolism. 2. Diffuse bilateral bronchial wall thickening, consistent with infectious or inflammatory bronchitis. 3.  Severe, bullous emphysema.  Emphysema (ICD10-J43.9). Electronically Signed   By: Eddie Candle M.D.   On: 01/08/2019 16:04    Orson Eva, DO  [facility] Pager [phone]  If 7PM-7AM, please contact night-coverage [email or web address] Password [email or web address] 01/09/2019, 8:59 AM   LOS: 1 day

## 2019-01-10 LAB — BASIC METABOLIC PANEL
Anion gap: 8 (ref 5–15)
BUN: 17 mg/dL (ref 6–20)
CO2: 31 mmol/L (ref 22–32)
Calcium: 9.1 mg/dL (ref 8.9–10.3)
Chloride: 101 mmol/L (ref 98–111)
Creatinine, Ser: 0.56 mg/dL — ABNORMAL LOW (ref 0.61–1.24)
GFR calc Af Amer: >60 mL/min (ref >60)
GFR calc non Af Amer: >60 mL/min (ref >60)
Glucose, Bld: 148 mg/dL — ABNORMAL HIGH (ref 70–99)
Potassium: 4.7 mmol/L (ref 3.5–5.1)
Sodium: 140 mmol/L (ref 135–145)

## 2019-01-10 LAB — CBC
HCT: 39.8 % (ref 39.0–52.0)
Hemoglobin: 12.2 g/dL — ABNORMAL LOW (ref 13.0–17.0)
MCH: 31.3 pg (ref 26.0–34.0)
MCHC: 30.7 g/dL (ref 30.0–36.0)
MCV: 102.1 fL — ABNORMAL HIGH (ref 80.0–100.0)
Platelets: 352 10*3/uL (ref 150–400)
RBC: 3.9 MIL/uL — ABNORMAL LOW (ref 4.22–5.81)
RDW: 11.9 % (ref 11.5–15.5)
WBC: 13.5 10*3/uL — ABNORMAL HIGH (ref 4.0–10.5)
nRBC: 0 % (ref 0.0–0.2)

## 2019-01-10 LAB — RESPIRATORY PANEL BY PCR

## 2019-01-10 LAB — LEGIONELLA PNEUMOPHILA SEROGP 1 UR AG: L. pneumophila Serogp 1 Ur Ag: NEGATIVE

## 2019-01-10 LAB — MAGNESIUM: Magnesium: 2.4 mg/dL (ref 1.7–2.4)

## 2019-01-10 LAB — GLUCOSE, CAPILLARY
Glucose-Capillary: 120 mg/dL — ABNORMAL HIGH (ref 70–99)
Glucose-Capillary: 156 mg/dL — ABNORMAL HIGH (ref 70–99)

## 2019-01-10 MED ORDER — METHYLPREDNISOLONE SODIUM SUCC 125 MG IJ SOLR
60.0000 mg | Freq: Four times a day (QID) | INTRAMUSCULAR | Status: DC
  Administered 2019-01-10 – 2019-01-14 (×17): 60 mg via INTRAVENOUS
  Filled 2019-01-10 (×17): qty 2

## 2019-01-10 MED ORDER — AMITRIPTYLINE HCL 25 MG PO TABS
25.0000 mg | ORAL_TABLET | Freq: Every day | ORAL | Status: DC
  Administered 2019-01-10: 25 mg via ORAL
  Filled 2019-01-10: qty 1

## 2019-01-10 MED ORDER — AZITHROMYCIN 250 MG PO TABS
500.0000 mg | ORAL_TABLET | ORAL | Status: DC
  Administered 2019-01-11 – 2019-01-14 (×4): 500 mg via ORAL
  Filled 2019-01-10 (×4): qty 2

## 2019-01-10 MED ORDER — KETOROLAC TROMETHAMINE 15 MG/ML IJ SOLN
15.0000 mg | Freq: Four times a day (QID) | INTRAMUSCULAR | Status: DC
  Administered 2019-01-10 – 2019-01-14 (×16): 15 mg via INTRAVENOUS
  Filled 2019-01-10 (×16): qty 1

## 2019-01-10 NOTE — Progress Notes (Signed)
Initial Nutrition Assessment  DOCUMENTATION CODES:   Not applicable  INTERVENTION:  Ensure Enlive po BID, each supplement provides 350 kcal and 20 grams of protein   Regular diet   Recommend consider supplement of Vitamin C (500 mg) daily- due to patient chronic smoking habit. Also suggest taking an Omega-3 daily (900 mg)- due to COPD history.  NUTRITION DIAGNOSIS:   Increased nutrient needs related to acute illness, chronic illness(acute pneumonia and chronic COPD) as evidenced by estimated needs.   GOAL:  Patient will meet greater than or equal to 90% of their needs  MONITOR:   Supplement acceptance, PO intake, Labs, Weight trends   REASON FOR ASSESSMENT:   Malnutrition Screening Tool   ASSESSMENT: Patient is a 53 yo male with history of COPD and chronic back pain. Presents with complaint of chest pain the past 3 weeks. He has been started on antibiotics- for pneumonia.   His appetite has not been affected based on intake today-100% of lunch meal consumed -observed by RD. No known chewing problems.    Weight history shows a weight that is fairly stable between 78-82 kg the past 3 years. Minimal change the past year. He weighed 79.8 kg this time last year.  Medications reviewed and include: morphine, prednisone, lyrica,  Nicoderm   Labs: BMP Latest Ref Rng & Units 01/10/2019 01/09/2019 01/08/2019  Glucose 70 - 99 mg/dL 148(H) 136(H) 112(H)  BUN 6 - 20 mg/dL 17 14 11   Creatinine 0.61 - 1.24 mg/dL 0.56(L) 0.70 0.87  Sodium 135 - 145 mmol/L 140 137 139  Potassium 3.5 - 5.1 mmol/L 4.7 5.1 3.9  Chloride 98 - 111 mmol/L 101 104 102  CO2 22 - 32 mmol/L 31 28 28   Calcium 8.9 - 10.3 mg/dL 9.1 8.6(L) 9.2     NUTRITION - FOCUSED PHYSICAL EXAM: Nutrition-Focused physical exam findings are mild orbital fat depletion,  Mild temporal muscle depletion. Unable to assess for edema LE. Patient has on ted hose.   Diet Order:   Diet Order            Diet regular Room service  appropriate? Yes; Fluid consistency: Thin  Diet effective now              EDUCATION NEEDS:  Not appropriate for education at this time Skin:  Skin Assessment: Reviewed RN Assessment  Last BM:  11/4  Height:   Ht Readings from Last 1 Encounters:  01/09/19 6' (1.829 m)    Weight:   Wt Readings from Last 1 Encounters:  01/10/19 78.4 kg    Ideal Body Weight:  81 kg  BMI:  Body mass index is 23.44 kg/m.  Estimated Nutritional Needs:   Kcal:  [id number] (28-30 kcal/kg/bw)  Protein:  92-101 gr (1.2-1.3 gr/kg/bw)  Fluid:  2.1-2.3 liters daily  Colman Cater MS,RD,CSG,LDN Office: [phone] Pager: [phone]

## 2019-01-10 NOTE — Progress Notes (Signed)
PROGRESS NOTE  Wesley [name] [id number] DOB: Aug 08, 1965 DOA: 01/08/2019 PCP: [name], MD   Brief History:  53 year old male with a history of chronic respiratory failure on 3 L, COPD, asbestosis, chronic back pain on chronic opioids presenting with left-sided chest pain for the last 3 weeks.  He describes it as a sharp constant and severe worsening with coughing and inspiration.  He denies any recent injuries or falls.  He denies any hemoptysis but has been having coughing with yellow-brown sputum.  He denies any fevers, chills, nausea, vomiting diarrhea, abdominal pain, dysuria, hematuria.  He states that he has been taking some over-the-counter pain relievers, but is not clear whether he has been taking acetaminophen or NSAIDs.  He denies overusing his opioids at home.  He has had some mild worsening of his shortness of breath and wheezing over the past 2 to 3 days.  He smokes electronic cigarettes for the last 4 years.  However he is exposed to his wife's tobacco smoking at home.  He denies any diaphoresis, worsening lower extreme edema, orthopnea, PND. In the emergency department, the patient was afebrile hemodynamically stable with oxygen saturation 96 to 100% on 3 L.  BMP and CBC were essentially unremarkable.  Magnesium 2.6.  CT angiogram of the chest was negative for pulmonary embolus.  It showed severe bullous emphysema with diffuse bilateral bronchial wall thickening.  There was evidence of bilateral bulla reduction.  Chest x-ray showed slight worsening of nodular opacity in the left lateral lung.  The patient was started on ceftriaxone and azithromycin.  Assessment/Plan: Community-acquired pneumonia -Continue ceftriaxone and azithromycin -Check procalcitonin <0.10 -Urine Legionella antigen--pending -Urine Streptococcus pneumoniae antigen--neg -SARS-CoV2--neg  Atypical chest pain -Likely combination of muscular skeletal strain as well as viscerosomatic response  from his pneumonia -Troponins negative x2 -07/18/2016 heart catheterization--normal coronaries -Personally reviewed EKG--sinus rhythm, nonspecific T wave changes   -Contninue Robaxin  COPD exacerbation -Continue Pulmicort -Continue duo nebs -Continue Brovana -viral respiratory panel--pending -continue IV solumedrol--increase to 60 mg q 6hr  Chronic pain syndrome -Continue home dose of MS Contin -Holding home dose of oxycodone while patient is receiving IV Dilaudid -reviewed PMP AWARE--patient receives monthly MS Contin 30 mg #90, oxycodone 30 mg #90, and Lyrica 150 mg #90 from one provider -UDS--positive opiate  Chronic respiratory failure with hypoxia -Chronically on 3 L nasal cannula  Uncontrolled left rib pain -troponin neg x 2 -personally reviewed EKG--sinus, nonspecific T wave change -continue dilaudid to 1 mg q 2 hrs prn -11/4 CTA chest--neg PE, severe bullous emphysema, diffuse bronchial wall thickening bilateral -continue home dose lyrica, MS Contin -add amitriptyline at hs -add toradol IV       Disposition Plan:   Home in 1-2 days  Family Communication:   No Family at bedside  Consultants: none   Code Status:  FULL   DVT Prophylaxis:  Bernice Lovenox   Procedures: As Listed in Progress Note Above  Antibiotics: Ceftriaxone 11/4>>> azithro 11/4>>>    Subjective: Pt continues to complain of pain in in the left intercostal area.  He states that it is better controlled with IV Dilaudid, but only bring the pain down by 10%.  He denies any fevers, chills, headache, nausea, vomiting or diarrhea, abdominal pain.  There is no hemoptysis.  He feels that his breathing is little bit better.  Objective: Vitals:   01/10/19 0549 01/10/19 0726 01/10/19 0731 01/10/19 0741  BP: 100/81  Pulse: 85     Resp: 19     Temp: 98.1 F (36.7 C)     TempSrc:      SpO2: 99% 100% 100% 100%  Weight:      Height:        Intake/Output Summary (Last 24 hours)  at 01/10/2019 1438 Last data filed at 01/10/2019 0500 Gross per 24 hour  Intake 342.62 ml  Output 1700 ml  Net -1357.38 ml   Weight change:  Exam:   General:  Pt is alert, follows commands appropriately, not in acute distress  HEENT: No icterus, No thrush, No neck mass, [place]/AT  Cardiovascular: RRR, S1/S2, no rubs, no gallops  Respiratory: bibasilar rales.  Bilateral exp wheeze  Abdomen: Soft/+BS, non tender, non distended, no guarding  Extremities: No edema, No lymphangitis, No petechiae, No rashes, no synovitis   Data Reviewed: I have personally reviewed following labs and imaging studies Basic Metabolic Panel: Recent Labs  Lab 01/08/19 1249 01/08/19 1440 01/09/19 0118 01/10/19 0658  NA 139  --  137 140  K 3.9  --  5.1 4.7  CL 102  --  104 101  CO2 28  --  28 31  GLUCOSE 112*  --  136* 148*  BUN 11  --  14 17  CREATININE 0.87  --  0.70 0.56*  CALCIUM 9.2  --  8.6* 9.1  MG  --  2.8* 2.6* 2.4   Liver Function Tests: No results for input(s): AST, ALT, ALKPHOS, BILITOT, PROT, ALBUMIN in the last 168 hours. No results for input(s): LIPASE, AMYLASE in the last 168 hours. No results for input(s): AMMONIA in the last 168 hours. Coagulation Profile: No results for input(s): INR, PROTIME in the last 168 hours. CBC: Recent Labs  Lab 01/08/19 1249 01/09/19 0118 01/10/19 0658  WBC 7.7 8.0 13.5*  NEUTROABS 5.5  --   --   HGB 13.9 12.5* 12.2*  HCT 43.9 40.1 39.8  MCV 99.3 101.0* 102.1*  PLT 350 308 352   Cardiac Enzymes: No results for input(s): CKTOTAL, CKMB, CKMBINDEX, TROPONINI in the last 168 hours. BNP: Invalid input(s): POCBNP CBG: Recent Labs  Lab 01/10/19 0750 01/10/19 1134  GLUCAP 120* 156*   HbA1C: No results for input(s): HGBA1C in the last 72 hours. Urine analysis:    Component Value Date/Time   COLORURINE YELLOW 01/17/2017 0210   APPEARANCEUR CLEAR 01/17/2017 0210   LABSPEC >1.046 (H) 01/17/2017 0210   PHURINE 5.0 01/17/2017 0210    GLUCOSEU NEGATIVE 01/17/2017 0210   HGBUR NEGATIVE 01/17/2017 0210   BILIRUBINUR NEGATIVE 01/17/2017 0210   KETONESUR NEGATIVE 01/17/2017 0210   PROTEINUR NEGATIVE 01/17/2017 0210   UROBILINOGEN 0.2 05/23/2014 0102   NITRITE NEGATIVE 01/17/2017 0210   LEUKOCYTESUR NEGATIVE 01/17/2017 0210   Sepsis Labs: @LABRCNTIP (procalcitonin:4,lacticidven:4) ) Recent Results (from the past 240 hour(s))  SARS CORONAVIRUS 2 (Saige Busby 6-24 HRS) Nasopharyngeal Nasopharyngeal Swab     Status: None   Collection Time: 01/08/19  1:43 PM   Specimen: Nasopharyngeal Swab  Result Value Ref Range Status   SARS Coronavirus 2 NEGATIVE NEGATIVE Final    Comment: (NOTE) SARS-CoV-2 target nucleic acids are NOT DETECTED. The SARS-CoV-2 RNA is generally detectable in upper and lower respiratory specimens during the acute phase of infection. Negative results do not preclude SARS-CoV-2 infection, do not rule out co-infections with other pathogens, and should not be used as the sole basis for treatment or other patient management decisions. Negative results must be combined with clinical observations, patient history,  and epidemiological information. The expected result is Negative. Fact Sheet for Patients: SugarRoll.be Fact Sheet for Healthcare Providers: https://www.woods-mathews.com/ This test is not yet approved or cleared by the [place] FDA and  has been authorized for detection and/or diagnosis of SARS-CoV-2 by FDA under an Emergency Use Authorization (EUA). This EUA will remain  in effect (meaning this test can be used) for the duration of the COVID-19 declaration under Section 56 4(b)(1) of the Act, 21 U.S.C. section 360bbb-3(b)(1), unless the authorization is terminated or revoked sooner. Performed at [facility], [place] [address], [place], [place] [address]   Blood culture (routine x 2)     Status: None (Preliminary result)   Collection Time: 01/08/19   2:40 PM   Specimen: BLOOD LEFT ARM  Result Value Ref Range Status   Specimen Description BLOOD LEFT ARM  Final   Special Requests   Final    BOTTLES DRAWN AEROBIC AND ANAEROBIC Blood Culture adequate volume   Culture   Final    NO GROWTH 2 DAYS Performed at [facility], [address] [place], [place] [address]    Report Status PENDING  Incomplete  Blood culture (routine x 2)     Status: None (Preliminary result)   Collection Time: 01/08/19  2:41 PM   Specimen: BLOOD RIGHT ARM  Result Value Ref Range Status   Specimen Description BLOOD RIGHT ARM  Final   Special Requests   Final    BOTTLES DRAWN AEROBIC AND ANAEROBIC Blood Culture adequate volume   Culture   Final    NO GROWTH 2 DAYS Performed at [facility], [address] [place], [place] [address]    Report Status PENDING  Incomplete     Scheduled Meds: . amitriptyline  25 mg Oral QHS  . arformoterol  15 mcg Nebulization BID  . aspirin  81 mg Oral Daily  . budesonide (PULMICORT) nebulizer solution  0.5 mg Nebulization BID  . enoxaparin (LOVENOX) injection  40 mg Subcutaneous Q24H  . feeding supplement (ENSURE ENLIVE)  237 mL Oral BID BM  . influenza vac split quadrivalent PF  0.5 mL Intramuscular Tomorrow-1000  . ipratropium-albuterol  3 mL Nebulization Q6H  . methocarbamol  500 mg Oral QID  . methylPREDNISolone (SOLU-MEDROL) injection  60 mg Intravenous Q6H  . montelukast  10 mg Oral Daily  . morphine  30 mg Oral Q8H  . nicotine  14 mg Transdermal Daily  . pneumococcal 23 valent vaccine  0.5 mL Intramuscular Tomorrow-1000  . pregabalin  150 mg Oral TID   Continuous Infusions: . azithromycin 500 mg (01/10/19 1429)  . cefTRIAXone (ROCEPHIN)  IV 2 g (01/10/19 1003)    Procedures/Studies: Dg Chest 2 View  Result Date: 01/08/2019 CLINICAL DATA:  53 year old male with a history of shortness of breath EXAM: CHEST - 2 VIEW COMPARISON:  01/17/2017, 01/06/2019 FINDINGS: Cardiomediastinal silhouette unchanged in size and  contour. No evidence of central vascular congestion. Stigmata of emphysema, with increased retrosternal airspace, flattened hemidiaphragms, increased AP diameter, and hyperinflation on the AP view. Architectural distortion in reticular opacities in the mid lungs bilaterally, similar to the prior including linear opacities at the superior left lung. Developing nodular opacity of the left lateral lung appears slightly worsened from the comparison plain film of [facility] rocking him. No pleural effusion or pneumothorax. No displaced fracture with degenerative changes of the spine IMPRESSION: Advanced emphysema and architectural distortion, with slight worsening of nodular opacity of the left lateral lung concerning for developing pneumonia. Followup  PA and lateral chest X-ray is recommended in 3-4 weeks following trial of antibiotic therapy to ensure resolution and exclude underlying malignancy. Electronically Signed   By: Corrie Mckusick D.O.   On: 01/08/2019 13:33   Ct Angio Chest Pe W And/or Wo Contrast  Result Date: 01/08/2019 CLINICAL DATA:  PE suspected EXAM: CT ANGIOGRAPHY CHEST WITH CONTRAST TECHNIQUE: Multidetector CT imaging of the chest was performed using the standard protocol during bolus administration of intravenous contrast. Multiplanar CT image reconstructions and MIPs were obtained to evaluate the vascular anatomy. CONTRAST:  165mL OMNIPAQUE IOHEXOL 350 MG/ML SOLN COMPARISON:  07/28/2016, 04/01/2016 FINDINGS: Cardiovascular: Satisfactory opacification of the pulmonary arteries to the segmental level. No evidence of pulmonary embolism. Normal heart size. No pericardial effusion. Mediastinum/Nodes: No enlarged mediastinal, hilar, or axillary lymph nodes. Thyroid gland, trachea, and esophagus demonstrate no significant findings. Lungs/Pleura: Severe, bullous emphysema. Diffuse bilateral bronchial wall thickening. Stable scarring of the anterior left upper lobe and lingula. Probable bulla reduction surgery  bilaterally. No pleural effusion or pneumothorax. Upper Abdomen: No acute abnormality. Unchanged benign, fat containing left adrenal adenoma. Musculoskeletal: No chest wall abnormality. No acute or significant osseous findings. Review of the MIP images confirms the above findings. IMPRESSION: 1.  Negative examination for pulmonary embolism. 2. Diffuse bilateral bronchial wall thickening, consistent with infectious or inflammatory bronchitis. 3.  Severe, bullous emphysema.  Emphysema (ICD10-J43.9). Electronically Signed   By: Eddie Candle M.D.   On: 01/08/2019 16:04    Orson Eva, DO  [facility] Pager [phone]  If 7PM-7AM, please contact night-coverage [email or web address] Password [email or web address] 01/10/2019, 2:38 PM   LOS: 2 days

## 2019-01-10 NOTE — Care Management Important Message (Signed)
Important Message  Patient Details  Name: Wesley [name] MRN: [place]:[id number] Date of Birth: Dec 10, 1965   Medicare Important Message Given:  Yes   Given to receptionist for RN to deliver to patient  Tommy Medal 01/10/2019, 3:24 PM

## 2019-01-11 DIAGNOSIS — M79606 Pain in leg, unspecified: Secondary | ICD-10-CM

## 2019-01-11 MED ORDER — ACETYLCYSTEINE 10% NICU INHALATION SOLUTION: 2.0000 mL | Freq: Two times a day (BID) | RESPIRATORY_TRACT | Status: DC

## 2019-01-11 MED ORDER — AMITRIPTYLINE HCL 25 MG PO TABS
50.0000 mg | ORAL_TABLET | Freq: Every day | ORAL | Status: DC
  Administered 2019-01-11 – 2019-01-13 (×3): 50 mg via ORAL
  Filled 2019-01-11 (×3): qty 2

## 2019-01-11 MED ORDER — ACETYLCYSTEINE 20 % IN SOLN
2.0000 mL | Freq: Two times a day (BID) | RESPIRATORY_TRACT | Status: DC
  Administered 2019-01-12 – 2019-01-14 (×5): 2 mL via RESPIRATORY_TRACT
  Filled 2019-01-11 (×10): qty 4

## 2019-01-11 MED ORDER — HYDROMORPHONE HCL 1 MG/ML IJ SOLN
1.5000 mg | INTRAMUSCULAR | Status: DC | PRN
  Administered 2019-01-11 – 2019-01-14 (×34): 1.5 mg via INTRAVENOUS
  Filled 2019-01-11 (×34): qty 1.5

## 2019-01-11 NOTE — Progress Notes (Signed)
PROGRESS NOTE  Wesley [name] [id number] DOB: 01/25/1966 DOA: 01/08/2019 PCP: [name], MD  Brief History: 53 year old male with a history of chronic respiratory failure on 3 L, COPD, asbestosis, chronic back pain on chronic opioids presenting with left-sided chest pain for the last 3 weeks. He describes it as a sharp constant and severe worsening with coughing and inspiration. He denies any recent injuries or falls. He denies any hemoptysis but has been having coughing with yellow-brown sputum. He denies any fevers, chills, nausea, vomiting diarrhea, abdominal pain, dysuria, hematuria. He states that he has been taking some over-the-counter pain relievers, but is not clear whether he has been taking acetaminophen or NSAIDs. He denies overusing his opioids at home. He has had some mild worsening of his shortness of breath and wheezing over the past 2 to 3 days. He smokes electronic cigarettes for the last 4 years. However he is exposed to his wife's tobacco smoking at home. He denies any diaphoresis, worsening lower extreme edema, orthopnea, PND. In the emergency department, the patient was afebrile hemodynamically stable with oxygen saturation 96 to 100% on 3 L. BMP and CBC were essentially unremarkable. Magnesium 2.6. CT angiogram of the chest was negative for pulmonary embolus. It showed severe bullous emphysema with diffuse bilateral bronchial wall thickening. There was evidence of bilateral bulla reduction. Chest x-ray showed slight worsening of nodular opacity in the left lateral lung. The patient was started on ceftriaxone and azithromycin.  Assessment/Plan: Community-acquired pneumonia -Continue ceftriaxone and azithromycin -Check procalcitonin <0.10 -Urine Legionella antigen--neg -Urine Streptococcus pneumoniae antigen--neg -SARS-CoV2--neg  Atypical chest pain -Likely combination of muscular skeletal strain aswell as viscerosomatic response from  his pneumonia -Troponins negative x2 -07/18/2016 heart catheterization--normal coronaries -Personally reviewed EKG--sinus rhythm, nonspecific T wave changes   -Contninue Robaxin  COPD exacerbation -Continue Pulmicort -Continue duo nebs -Continue Brovana -viral respiratory panel--neg -continue IV solumedrol--increase to 60 mg q 6hr  Chronic pain syndrome -Continue home dose of MS Contin -Holding home dose of oxycodone while patient is receiving IV Dilaudid -reviewed PMP AWARE--patient receives monthly MS Contin30 mg #90,oxycodone30 mg #90,and Lyrica 150 mg#77from one provider -UDS--positive opiate  Chronic respiratory failure with hypoxia -Chronically on 3 L nasal cannula  Uncontrolled left rib pain -troponin neg x 2 -personally reviewed EKG--sinus, nonspecific T wave change -continue dilaudid to 1.5 mg q 2 hrs prn -11/4 CTA chest--neg PE, severe bullous emphysema, diffuse bronchial wall thickening bilateral -continue home dose lyrica, MS Contin -increase amitriptyline at hs -continue toradol IV       Disposition Plan: Home in 1-2days  Family Communication:NoFamily at bedside  Consultants:none  Code Status: FULL   DVT Prophylaxis: [place] Lovenox   Procedures: As Listed in Progress Note Above  Antibiotics: Ceftriaxone 11/4>>> azithro11/4>>>     Subjective: Pt feel that his breathing is a little better than yesterday, but still has sob with mild exertion to BR.  Denies n/v/d, abd pain, f/c, hemoptysis  Objective: Vitals:   01/11/19 0755 01/11/19 0756 01/11/19 1423 01/11/19 1431  BP:    117/75  Pulse:    93  Resp:    20  Temp:    98 F (36.7 C)  TempSrc:    Oral  SpO2: 97% 97% 97% 100%  Weight:      Height:        Intake/Output Summary (Last 24 hours) at 01/11/2019 1619 Last data filed at 01/11/2019 1417 Gross per 24 hour  Intake -  Output 1125 ml  Net -1125 ml   Weight change: 3.946 kg Exam:   General:  Pt is  alert, follows commands appropriately, not in acute distress  HEENT: No icterus, No thrush, No neck mass, [place]/AT  Cardiovascular: RRR, S1/S2, no rubs, no gallops  Respiratory: CTA bilaterally, no wheezing, no crackles, no rhonchi  Abdomen: Soft/+BS, non tender, non distended, no guarding  Extremities: No edema, No lymphangitis, No petechiae, No rashes, no synovitis   Data Reviewed: I have personally reviewed following labs and imaging studies Basic Metabolic Panel: Recent Labs  Lab 01/08/19 1249 01/08/19 1440 01/09/19 0118 01/10/19 0658  NA 139  --  137 140  K 3.9  --  5.1 4.7  CL 102  --  104 101  CO2 28  --  28 31  GLUCOSE 112*  --  136* 148*  BUN 11  --  14 17  CREATININE 0.87  --  0.70 0.56*  CALCIUM 9.2  --  8.6* 9.1  MG  --  2.8* 2.6* 2.4   Liver Function Tests: No results for input(s): AST, ALT, ALKPHOS, BILITOT, PROT, ALBUMIN in the last 168 hours. No results for input(s): LIPASE, AMYLASE in the last 168 hours. No results for input(s): AMMONIA in the last 168 hours. Coagulation Profile: No results for input(s): INR, PROTIME in the last 168 hours. CBC: Recent Labs  Lab 01/08/19 1249 01/09/19 0118 01/10/19 0658  WBC 7.7 8.0 13.5*  NEUTROABS 5.5  --   --   HGB 13.9 12.5* 12.2*  HCT 43.9 40.1 39.8  MCV 99.3 101.0* 102.1*  PLT 350 308 352   Cardiac Enzymes: No results for input(s): CKTOTAL, CKMB, CKMBINDEX, TROPONINI in the last 168 hours. BNP: Invalid input(s): POCBNP CBG: Recent Labs  Lab 01/10/19 0750 01/10/19 1134  GLUCAP 120* 156*   HbA1C: No results for input(s): HGBA1C in the last 72 hours. Urine analysis:    Component Value Date/Time   COLORURINE YELLOW 01/17/2017 0210   APPEARANCEUR CLEAR 01/17/2017 0210   LABSPEC >1.046 (H) 01/17/2017 0210   PHURINE 5.0 01/17/2017 0210   GLUCOSEU NEGATIVE 01/17/2017 0210   HGBUR NEGATIVE 01/17/2017 0210   BILIRUBINUR NEGATIVE 01/17/2017 0210   KETONESUR NEGATIVE 01/17/2017 0210   PROTEINUR  NEGATIVE 01/17/2017 0210   UROBILINOGEN 0.2 05/23/2014 0102   NITRITE NEGATIVE 01/17/2017 0210   LEUKOCYTESUR NEGATIVE 01/17/2017 0210   Sepsis Labs: @LABRCNTIP (procalcitonin:4,lacticidven:4) ) Recent Results (from the past 240 hour(s))  SARS CORONAVIRUS 2 (Greyson Peavy 6-24 HRS) Nasopharyngeal Nasopharyngeal Swab     Status: None   Collection Time: 01/08/19  1:43 PM   Specimen: Nasopharyngeal Swab  Result Value Ref Range Status   SARS Coronavirus 2 NEGATIVE NEGATIVE Final    Comment: (NOTE) SARS-CoV-2 target nucleic acids are NOT DETECTED. The SARS-CoV-2 RNA is generally detectable in upper and lower respiratory specimens during the acute phase of infection. Negative results do not preclude SARS-CoV-2 infection, do not rule out co-infections with other pathogens, and should not be used as the sole basis for treatment or other patient management decisions. Negative results must be combined with clinical observations, patient history, and epidemiological information. The expected result is Negative. Fact Sheet for Patients: SugarRoll.be Fact Sheet for Healthcare Providers: https://www.woods-mathews.com/ This test is not yet approved or cleared by the [place] FDA and  has been authorized for detection and/or diagnosis of SARS-CoV-2 by FDA under an Emergency Use Authorization (EUA). This EUA will remain  in effect (meaning this test can be used) for the duration  of the COVID-19 declaration under Section 56 4(b)(1) of the Act, 21 U.S.C. section 360bbb-3(b)(1), unless the authorization is terminated or revoked sooner. Performed at [facility], [place] [address], [place], [place] [address]   Blood culture (routine x 2)     Status: None (Preliminary result)   Collection Time: 01/08/19  2:40 PM   Specimen: BLOOD LEFT ARM  Result Value Ref Range Status   Specimen Description BLOOD LEFT ARM  Final   Special Requests   Final    BOTTLES DRAWN  AEROBIC AND ANAEROBIC Blood Culture adequate volume   Culture   Final    NO GROWTH 3 DAYS Performed at [facility], [address] [place], [place] [address]    Report Status PENDING  Incomplete  Blood culture (routine x 2)     Status: None (Preliminary result)   Collection Time: 01/08/19  2:41 PM   Specimen: BLOOD RIGHT ARM  Result Value Ref Range Status   Specimen Description BLOOD RIGHT ARM  Final   Special Requests   Final    BOTTLES DRAWN AEROBIC AND ANAEROBIC Blood Culture adequate volume   Culture   Final    NO GROWTH 3 DAYS Performed at [facility], [address] [place], [place] [address]    Report Status PENDING  Incomplete  Respiratory Panel by PCR     Status: None   Collection Time: 01/10/19 11:26 AM   Specimen: Nasopharyngeal Swab; Respiratory  Result Value Ref Range Status   Adenovirus NOT DETECTED NOT DETECTED Final   Coronavirus 229E NOT DETECTED NOT DETECTED Final    Comment: (NOTE) The Coronavirus on the Respiratory Panel, DOES NOT test for the novel  Coronavirus (2019 nCoV)    Coronavirus HKU1 NOT DETECTED NOT DETECTED Final   Coronavirus NL63 NOT DETECTED NOT DETECTED Final   Coronavirus OC43 NOT DETECTED NOT DETECTED Final   Metapneumovirus NOT DETECTED NOT DETECTED Final   Rhinovirus / Enterovirus NOT DETECTED NOT DETECTED Final   Influenza A NOT DETECTED NOT DETECTED Final   Influenza B NOT DETECTED NOT DETECTED Final   Parainfluenza Virus 1 NOT DETECTED NOT DETECTED Final   Parainfluenza Virus 2 NOT DETECTED NOT DETECTED Final   Parainfluenza Virus 3 NOT DETECTED NOT DETECTED Final   Parainfluenza Virus 4 NOT DETECTED NOT DETECTED Final   Respiratory Syncytial Virus NOT DETECTED NOT DETECTED Final   Bordetella pertussis NOT DETECTED NOT DETECTED Final   Chlamydophila pneumoniae NOT DETECTED NOT DETECTED Final   Mycoplasma pneumoniae NOT DETECTED NOT DETECTED Final    Comment: Performed at [facility], [place] [address], [place], [place]  [address]     Scheduled Meds: . acetylcysteine  2 mL Nebulization BID  . amitriptyline  50 mg Oral QHS  . arformoterol  15 mcg Nebulization BID  . aspirin  81 mg Oral Daily  . azithromycin  500 mg Oral Q24H  . budesonide (PULMICORT) nebulizer solution  0.5 mg Nebulization BID  . enoxaparin (LOVENOX) injection  40 mg Subcutaneous Q24H  . feeding supplement (ENSURE ENLIVE)  237 mL Oral BID BM  . influenza vac split quadrivalent PF  0.5 mL Intramuscular Tomorrow-1000  . ipratropium-albuterol  3 mL Nebulization Q6H  . ketorolac  15 mg Intravenous Q6H  . methocarbamol  500 mg Oral QID  . methylPREDNISolone (SOLU-MEDROL) injection  60 mg Intravenous Q6H  . montelukast  10 mg Oral Daily  . morphine  30 mg Oral Q8H  . nicotine  14 mg Transdermal  Daily  . pneumococcal 23 valent vaccine  0.5 mL Intramuscular Tomorrow-1000  . pregabalin  150 mg Oral TID   Continuous Infusions: . cefTRIAXone (ROCEPHIN)  IV 2 g (01/11/19 1003)    Procedures/Studies: Dg Chest 2 View  Result Date: 01/08/2019 CLINICAL DATA:  53 year old male with a history of shortness of breath EXAM: CHEST - 2 VIEW COMPARISON:  01/17/2017, 01/06/2019 FINDINGS: Cardiomediastinal silhouette unchanged in size and contour. No evidence of central vascular congestion. Stigmata of emphysema, with increased retrosternal airspace, flattened hemidiaphragms, increased AP diameter, and hyperinflation on the AP view. Architectural distortion in reticular opacities in the mid lungs bilaterally, similar to the prior including linear opacities at the superior left lung. Developing nodular opacity of the left lateral lung appears slightly worsened from the comparison plain film of [facility] rocking him. No pleural effusion or pneumothorax. No displaced fracture with degenerative changes of the spine IMPRESSION: Advanced emphysema and architectural distortion, with slight worsening of nodular opacity of the left lateral lung concerning for developing pneumonia.  Followup PA and lateral chest X-ray is recommended in 3-4 weeks following trial of antibiotic therapy to ensure resolution and exclude underlying malignancy. Electronically Signed   By: Corrie Mckusick D.O.   On: 01/08/2019 13:33   Ct Angio Chest Pe W And/or Wo Contrast  Result Date: 01/08/2019 CLINICAL DATA:  PE suspected EXAM: CT ANGIOGRAPHY CHEST WITH CONTRAST TECHNIQUE: Multidetector CT imaging of the chest was performed using the standard protocol during bolus administration of intravenous contrast. Multiplanar CT image reconstructions and MIPs were obtained to evaluate the vascular anatomy. CONTRAST:  157mL OMNIPAQUE IOHEXOL 350 MG/ML SOLN COMPARISON:  07/28/2016, 04/01/2016 FINDINGS: Cardiovascular: Satisfactory opacification of the pulmonary arteries to the segmental level. No evidence of pulmonary embolism. Normal heart size. No pericardial effusion. Mediastinum/Nodes: No enlarged mediastinal, hilar, or axillary lymph nodes. Thyroid gland, trachea, and esophagus demonstrate no significant findings. Lungs/Pleura: Severe, bullous emphysema. Diffuse bilateral bronchial wall thickening. Stable scarring of the anterior left upper lobe and lingula. Probable bulla reduction surgery bilaterally. No pleural effusion or pneumothorax. Upper Abdomen: No acute abnormality. Unchanged benign, fat containing left adrenal adenoma. Musculoskeletal: No chest wall abnormality. No acute or significant osseous findings. Review of the MIP images confirms the above findings. IMPRESSION: 1.  Negative examination for pulmonary embolism. 2. Diffuse bilateral bronchial wall thickening, consistent with infectious or inflammatory bronchitis. 3.  Severe, bullous emphysema.  Emphysema (ICD10-J43.9). Electronically Signed   By: Eddie Candle M.D.   On: 01/08/2019 16:04    Orson Eva, DO  [facility] Pager [phone]  If 7PM-7AM, please contact night-coverage [email or web address] Password [email or web address] 01/11/2019, 4:19 PM   LOS: 3 days

## 2019-01-12 ENCOUNTER — Inpatient Hospital Stay (HOSPITAL_COMMUNITY): Payer: Medicare HMO

## 2019-01-12 LAB — BASIC METABOLIC PANEL
Anion gap: 13 (ref 5–15)
BUN: 24 mg/dL — ABNORMAL HIGH (ref 6–20)
CO2: 30 mmol/L (ref 22–32)
Calcium: 9.3 mg/dL (ref 8.9–10.3)
Chloride: 95 mmol/L — ABNORMAL LOW (ref 98–111)
Creatinine, Ser: 0.66 mg/dL (ref 0.61–1.24)
GFR calc Af Amer: >60 mL/min (ref >60)
GFR calc non Af Amer: >60 mL/min (ref >60)
Glucose, Bld: 155 mg/dL — ABNORMAL HIGH (ref 70–99)
Potassium: 4.4 mmol/L (ref 3.5–5.1)
Sodium: 138 mmol/L (ref 135–145)

## 2019-01-12 LAB — CBC
HCT: 39.2 % (ref 39.0–52.0)
Hemoglobin: 12.2 g/dL — ABNORMAL LOW (ref 13.0–17.0)
MCH: 31.2 pg (ref 26.0–34.0)
MCHC: 31.1 g/dL (ref 30.0–36.0)
MCV: 100.3 fL — ABNORMAL HIGH (ref 80.0–100.0)
Platelets: 377 10*3/uL (ref 150–400)
RBC: 3.91 MIL/uL — ABNORMAL LOW (ref 4.22–5.81)
RDW: 12.1 % (ref 11.5–15.5)
WBC: 15.6 10*3/uL — ABNORMAL HIGH (ref 4.0–10.5)
nRBC: 0 % (ref 0.0–0.2)

## 2019-01-12 LAB — MAGNESIUM: Magnesium: 2.3 mg/dL (ref 1.7–2.4)

## 2019-01-12 MED ORDER — BISACODYL 10 MG RE SUPP
10.0000 mg | Freq: Once | RECTAL | Status: AC
  Administered 2019-01-12: 10 mg via RECTAL
  Filled 2019-01-12: qty 1

## 2019-01-12 MED ORDER — LORAZEPAM 0.5 MG PO TABS
0.5000 mg | ORAL_TABLET | Freq: Two times a day (BID) | ORAL | Status: DC | PRN
  Administered 2019-01-12: 22:00:00 0.5 mg via ORAL
  Filled 2019-01-12: qty 1

## 2019-01-12 MED ORDER — LORAZEPAM 2 MG/ML IJ SOLN
0.5000 mg | Freq: Two times a day (BID) | INTRAMUSCULAR | Status: DC | PRN
  Filled 2019-01-12 (×3): qty 1

## 2019-01-12 MED ORDER — SODIUM CHLORIDE 0.9 % IV SOLN
1.0000 g | INTRAVENOUS | Status: DC
  Administered 2019-01-13 – 2019-01-14 (×2): 1 g via INTRAVENOUS
  Filled 2019-01-12 (×2): qty 10

## 2019-01-12 MED ORDER — POLYETHYLENE GLYCOL 3350 17 G PO PACK
17.0000 g | PACK | Freq: Every day | ORAL | Status: DC
  Administered 2019-01-12 – 2019-01-14 (×3): 17 g via ORAL
  Filled 2019-01-12 (×3): qty 1

## 2019-01-12 NOTE — Progress Notes (Signed)
70- Patient instructed nurse to "push the flush harder, so I can feel it." Patient has also been calling for his IV dilaudid every two hours since admission even after the dose was increased. Requested order for anxiety medication, patient told nurse the order was already in and to not "bother the doctor, they've already talked to me today." Patient had a depressed affect when he made that statement and insisted he'd had ativan last night. Nurse unable to find any history of ativan being ordered, requested and received order for ativan. Will give to patient with his evening medications.  Re-educated and attempted to use teachback to re-explain to patient what "as needed" pain medication is for and why it is not "late" if it's not brought to him every two hours. Patient said "I was only joking, I didn' t realize yall couldn't take a joke." Explained to patient he could joke about other things, but not his medications. Will continue to monitor.

## 2019-01-12 NOTE — Progress Notes (Addendum)
PROGRESS NOTE  Wesley [name] [id number] DOB: [birth year] DOA: 01/08/2019 PCP: [name], MD  Brief History: 53 year old male with a history of chronic respiratory failure on 3 L, COPD, asbestosis, chronic back pain on chronic opioids presenting with left-sided chest pain for the last 3 weeks. He describes it as a sharp constant and severe worsening with coughing and inspiration. He denies any recent injuries or falls. He denies any hemoptysis but has been having coughing with yellow-brown sputum. He denies any fevers, chills, nausea, vomiting diarrhea, abdominal pain, dysuria, hematuria. He states that he has been taking some over-the-counter pain relievers, but is not clear whether he has been taking acetaminophen or NSAIDs. He denies overusing his opioids at home. He has had some mild worsening of his shortness of breath and wheezing over the past 2 to 3 days. He smokes electronic cigarettes for the last 4 years. However he is exposed to his wife's tobacco smoking at home. He denies any diaphoresis, worsening lower extreme edema, orthopnea, PND. In the emergency department, the patient was afebrile hemodynamically stable with oxygen saturation 96 to 100% on 3 L. BMP and CBC were essentially unremarkable. Magnesium 2.6. CT angiogram of the chest was negative for pulmonary embolus. It showed severe bullous emphysema with diffuse bilateral bronchial wall thickening. There was evidence of bilateral bulla reduction. Chest x-ray showed slight worsening of nodular opacity in the left lateral lung. The patient was started on ceftriaxone and azithromycin.  Assessment/Plan: Community-acquired pneumonia -Continue ceftriaxone and azithromycin -Check procalcitonin<0.10 -Urine Legionella antigen--neg -Urine Streptococcus pneumoniae antigen--neg -SARS-CoV2--neg  Atypical chest pain -Likely combination of muscular skeletal strain aswell as viscerosomatic response from  his pneumonia -Troponins negative x2 -07/18/2016 heart catheterization--normal coronaries -Personally reviewed EKG--sinus rhythm, nonspecific T wave changes  -ContninueRobaxin -continue IV dilaudid  COPD exacerbation -ContinuePulmicort -Continue duo nebs -ContinueBrovana -add mucomyst -viral respiratory panel--neg -continue IV solumedrol--increase to 60 mg q 6hr  Chronic pain syndrome -Continue home dose of MS Contin -Holding home dose of oxycodone while patient is receiving IV Dilaudid -reviewed PMP AWARE--patient receives monthly MS Contin30 mg #90,oxycodone30 mg #90,and Lyrica 150 mg#56from one provider -UDS--positive opiate  Chronic respiratory failure with hypoxia -Chronically on 3 L nasal cannula  Uncontrolled left rib pain -troponin neg x 2 -personally reviewed EKG--sinus, nonspecific T wave change -continue dilaudid to 1.5 mg q 2 hrs prn -11/4 CTA chest--neg PE, severe bullous emphysema, diffuse bronchial wall thickening bilateral -continue home dose lyrica, MS Contin -increased amitriptyline at hs -continue toradol IV  Leg pain and edema -venous duplex   Disposition Plan: Home in 1-2days  Family Communication:NoFamily at bedside  Consultants:none  Code Status: FULL   DVT Prophylaxis: [place] Lovenox   Procedures: As Listed in Progress Note Above  Antibiotics: Ceftriaxone 11/4>>> azithro11/4>>>  Disposition Plan: Home 11/9 if stable  Family Communication:NoFamily at bedside  Consultants:none  Code Status: FULL   DVT Prophylaxis: [place] Lovenox   Procedures: As Listed in Progress Note Above  Antibiotics: Ceftriaxone 11/4>>> azithro11/4>>>    Subjective: Pt states his breathing is improving and left rib pain is under better control.  Denies n/v/d, abd pain, headache, hemoptysis.  Objective: Vitals:   01/12/19 0757 01/12/19 0759 01/12/19 0800 01/12/19 1127  BP:    129/85  Pulse:    (!) 119   Resp:    20  Temp:    98.1 F (36.7 C)  TempSrc:    Oral  SpO2: 95% 95% 95% 99%  Weight:      Height:        Intake/Output Summary (Last 24 hours) at 01/12/2019 1135 Last data filed at 01/12/2019 0400 Gross per 24 hour  Intake 747.62 ml  Output 2350 ml  Net -1602.38 ml   Weight change: 3.447 kg Exam:   General:  Pt is alert, follows commands appropriately, not in acute distress  HEENT: No icterus, No thrush, No neck mass, [place]/AT  Cardiovascular: RRR, S1/S2, no rubs, no gallops  Respiratory: bilateral wheeze, mostly upper airway.  Scattered rales.    Abdomen: Soft/+BS, non tender, non distended, no guarding  Extremities: 1 +LE edema, No lymphangitis, No petechiae, No rashes, no synovitis   Data Reviewed: I have personally reviewed following labs and imaging studies Basic Metabolic Panel: Recent Labs  Lab 01/08/19 1249 01/08/19 1440 01/09/19 0118 01/10/19 0658 01/12/19 0653  NA 139  --  137 140 138  K 3.9  --  5.1 4.7 4.4  CL 102  --  104 101 95*  CO2 28  --  28 31 30   GLUCOSE 112*  --  136* 148* 155*  BUN 11  --  14 17 24*  CREATININE 0.87  --  0.70 0.56* 0.66  CALCIUM 9.2  --  8.6* 9.1 9.3  MG  --  2.8* 2.6* 2.4 2.3   Liver Function Tests: No results for input(s): AST, ALT, ALKPHOS, BILITOT, PROT, ALBUMIN in the last 168 hours. No results for input(s): LIPASE, AMYLASE in the last 168 hours. No results for input(s): AMMONIA in the last 168 hours. Coagulation Profile: No results for input(s): INR, PROTIME in the last 168 hours. CBC: Recent Labs  Lab 01/08/19 1249 01/09/19 0118 01/10/19 0658 01/12/19 0653  WBC 7.7 8.0 13.5* 15.6*  NEUTROABS 5.5  --   --   --   HGB 13.9 12.5* 12.2* 12.2*  HCT 43.9 40.1 39.8 39.2  MCV 99.3 101.0* 102.1* 100.3*  PLT 350 308 352 377   Cardiac Enzymes: No results for input(s): CKTOTAL, CKMB, CKMBINDEX, TROPONINI in the last 168 hours. BNP: Invalid input(s): POCBNP CBG: Recent Labs  Lab 01/10/19 0750 01/10/19 1134   GLUCAP 120* 156*   HbA1C: No results for input(s): HGBA1C in the last 72 hours. Urine analysis:    Component Value Date/Time   COLORURINE YELLOW 01/17/2017 0210   APPEARANCEUR CLEAR 01/17/2017 0210   LABSPEC >1.046 (H) 01/17/2017 0210   PHURINE 5.0 01/17/2017 0210   GLUCOSEU NEGATIVE 01/17/2017 0210   HGBUR NEGATIVE 01/17/2017 0210   BILIRUBINUR NEGATIVE 01/17/2017 0210   KETONESUR NEGATIVE 01/17/2017 0210   PROTEINUR NEGATIVE 01/17/2017 0210   UROBILINOGEN 0.2 05/23/2014 0102   NITRITE NEGATIVE 01/17/2017 0210   LEUKOCYTESUR NEGATIVE 01/17/2017 0210   Sepsis Labs: @LABRCNTIP (procalcitonin:4,lacticidven:4) ) Recent Results (from the past 240 hour(s))  SARS CORONAVIRUS 2 (Colleen Kotlarz 6-24 HRS) Nasopharyngeal Nasopharyngeal Swab     Status: None   Collection Time: 01/08/19  1:43 PM   Specimen: Nasopharyngeal Swab  Result Value Ref Range Status   SARS Coronavirus 2 NEGATIVE NEGATIVE Final    Comment: (NOTE) SARS-CoV-2 target nucleic acids are NOT DETECTED. The SARS-CoV-2 RNA is generally detectable in upper and lower respiratory specimens during the acute phase of infection. Negative results do not preclude SARS-CoV-2 infection, do not rule out co-infections with other pathogens, and should not be used as the sole basis for treatment or other patient management decisions. Negative results must be combined with clinical observations, patient history, and epidemiological information. The expected result is  Negative. Fact Sheet for Patients: SugarRoll.be Fact Sheet for Healthcare Providers: https://www.woods-mathews.com/ This test is not yet approved or cleared by the [place] FDA and  has been authorized for detection and/or diagnosis of SARS-CoV-2 by FDA under an Emergency Use Authorization (EUA). This EUA will remain  in effect (meaning this test can be used) for the duration of the COVID-19 declaration under Section 56 4(b)(1) of  the Act, 21 U.S.C. section 360bbb-3(b)(1), unless the authorization is terminated or revoked sooner. Performed at [facility], [place] [address] [place], [place] [address]   Blood culture (routine x 2)     Status: None (Preliminary result)   Collection Time: 01/08/19  2:40 PM   Specimen: BLOOD LEFT ARM  Result Value Ref Range Status   Specimen Description BLOOD LEFT ARM  Final   Special Requests   Final    BOTTLES DRAWN AEROBIC AND ANAEROBIC Blood Culture adequate volume   Culture   Final    NO GROWTH 3 DAYS Performed at [facility], [address] [place], [place] [address]    Report Status PENDING  Incomplete  Blood culture (routine x 2)     Status: None (Preliminary result)   Collection Time: 01/08/19  2:41 PM   Specimen: BLOOD RIGHT ARM  Result Value Ref Range Status   Specimen Description BLOOD RIGHT ARM  Final   Special Requests   Final    BOTTLES DRAWN AEROBIC AND ANAEROBIC Blood Culture adequate volume   Culture   Final    NO GROWTH 3 DAYS Performed at [facility], [address] [place], [place] [address]    Report Status PENDING  Incomplete  Respiratory Panel by PCR     Status: None   Collection Time: 01/10/19 11:26 AM   Specimen: Nasopharyngeal Swab; Respiratory  Result Value Ref Range Status   Adenovirus NOT DETECTED NOT DETECTED Final   Coronavirus 229E NOT DETECTED NOT DETECTED Final    Comment: (NOTE) The Coronavirus on the Respiratory Panel, DOES NOT test for the novel  Coronavirus (2019 nCoV)    Coronavirus HKU1 NOT DETECTED NOT DETECTED Final   Coronavirus NL63 NOT DETECTED NOT DETECTED Final   Coronavirus OC43 NOT DETECTED NOT DETECTED Final   Metapneumovirus NOT DETECTED NOT DETECTED Final   Rhinovirus / Enterovirus NOT DETECTED NOT DETECTED Final   Influenza A NOT DETECTED NOT DETECTED Final   Influenza B NOT DETECTED NOT DETECTED Final   Parainfluenza Virus 1 NOT DETECTED NOT DETECTED Final   Parainfluenza Virus 2 NOT DETECTED NOT DETECTED  Final   Parainfluenza Virus 3 NOT DETECTED NOT DETECTED Final   Parainfluenza Virus 4 NOT DETECTED NOT DETECTED Final   Respiratory Syncytial Virus NOT DETECTED NOT DETECTED Final   Bordetella pertussis NOT DETECTED NOT DETECTED Final   Chlamydophila pneumoniae NOT DETECTED NOT DETECTED Final   Mycoplasma pneumoniae NOT DETECTED NOT DETECTED Final    Comment: Performed at [facility], [place] [address] [place], [place] [address]     Scheduled Meds: . acetylcysteine  2 mL Nebulization BID  . amitriptyline  50 mg Oral QHS  . arformoterol  15 mcg Nebulization BID  . aspirin  81 mg Oral Daily  . azithromycin  500 mg Oral Q24H  . budesonide (PULMICORT) nebulizer solution  0.5 mg Nebulization BID  . enoxaparin (LOVENOX) injection  40 mg Subcutaneous Q24H  . feeding supplement (ENSURE ENLIVE)  237 mL Oral BID BM  . influenza vac split quadrivalent PF  0.5  mL Intramuscular Tomorrow-1000  . ipratropium-albuterol  3 mL Nebulization Q6H  . ketorolac  15 mg Intravenous Q6H  . methocarbamol  500 mg Oral QID  . methylPREDNISolone (SOLU-MEDROL) injection  60 mg Intravenous Q6H  . montelukast  10 mg Oral Daily  . morphine  30 mg Oral Q8H  . nicotine  14 mg Transdermal Daily  . pneumococcal 23 valent vaccine  0.5 mL Intramuscular Tomorrow-1000  . pregabalin  150 mg Oral TID   Continuous Infusions: . cefTRIAXone (ROCEPHIN)  IV 2 g (01/12/19 0925)    Procedures/Studies: Dg Chest 2 View  Result Date: 01/08/2019 CLINICAL DATA:  53 year old male with a history of shortness of breath EXAM: CHEST - 2 VIEW COMPARISON:  01/17/2017, 01/06/2019 FINDINGS: Cardiomediastinal silhouette unchanged in size and contour. No evidence of central vascular congestion. Stigmata of emphysema, with increased retrosternal airspace, flattened hemidiaphragms, increased AP diameter, and hyperinflation on the AP view. Architectural distortion in reticular opacities in the mid lungs bilaterally, similar to the prior  including linear opacities at the superior left lung. Developing nodular opacity of the left lateral lung appears slightly worsened from the comparison plain film of [facility] rocking him. No pleural effusion or pneumothorax. No displaced fracture with degenerative changes of the spine IMPRESSION: Advanced emphysema and architectural distortion, with slight worsening of nodular opacity of the left lateral lung concerning for developing pneumonia. Followup PA and lateral chest X-ray is recommended in 3-4 weeks following trial of antibiotic therapy to ensure resolution and exclude underlying malignancy. Electronically Signed   By: Corrie Mckusick D.O.   On: 01/08/2019 13:33   Ct Angio Chest Pe W And/or Wo Contrast  Result Date: 01/08/2019 CLINICAL DATA:  PE suspected EXAM: CT ANGIOGRAPHY CHEST WITH CONTRAST TECHNIQUE: Multidetector CT imaging of the chest was performed using the standard protocol during bolus administration of intravenous contrast. Multiplanar CT image reconstructions and MIPs were obtained to evaluate the vascular anatomy. CONTRAST:  175mL OMNIPAQUE IOHEXOL 350 MG/ML SOLN COMPARISON:  07/28/2016, 04/01/2016 FINDINGS: Cardiovascular: Satisfactory opacification of the pulmonary arteries to the segmental level. No evidence of pulmonary embolism. Normal heart size. No pericardial effusion. Mediastinum/Nodes: No enlarged mediastinal, hilar, or axillary lymph nodes. Thyroid gland, trachea, and esophagus demonstrate no significant findings. Lungs/Pleura: Severe, bullous emphysema. Diffuse bilateral bronchial wall thickening. Stable scarring of the anterior left upper lobe and lingula. Probable bulla reduction surgery bilaterally. No pleural effusion or pneumothorax. Upper Abdomen: No acute abnormality. Unchanged benign, fat containing left adrenal adenoma. Musculoskeletal: No chest wall abnormality. No acute or significant osseous findings. Review of the MIP images confirms the above findings. IMPRESSION: 1.   Negative examination for pulmonary embolism. 2. Diffuse bilateral bronchial wall thickening, consistent with infectious or inflammatory bronchitis. 3.  Severe, bullous emphysema.  Emphysema (ICD10-J43.9). Electronically Signed   By: Eddie Candle M.D.   On: 01/08/2019 16:04    Orson Eva, DO  [facility] Pager [phone]  If 7PM-7AM, please contact night-coverage [email or web address] Password [email or web address] 01/12/2019, 11:35 AM   LOS: 4 days

## 2019-01-13 DIAGNOSIS — M79604 Pain in right leg: Secondary | ICD-10-CM

## 2019-01-13 DIAGNOSIS — M79605 Pain in left leg: Secondary | ICD-10-CM

## 2019-01-13 LAB — BASIC METABOLIC PANEL
Anion gap: 8 (ref 5–15)
BUN: 31 mg/dL — ABNORMAL HIGH (ref 6–20)
CO2: 33 mmol/L — ABNORMAL HIGH (ref 22–32)
Calcium: 8.8 mg/dL — ABNORMAL LOW (ref 8.9–10.3)
Chloride: 97 mmol/L — ABNORMAL LOW (ref 98–111)
Creatinine, Ser: 0.65 mg/dL (ref 0.61–1.24)
GFR calc Af Amer: >60 mL/min (ref >60)
GFR calc non Af Amer: >60 mL/min (ref >60)
Glucose, Bld: 129 mg/dL — ABNORMAL HIGH (ref 70–99)
Potassium: 4.9 mmol/L (ref 3.5–5.1)
Sodium: 138 mmol/L (ref 135–145)

## 2019-01-13 LAB — CULTURE, BLOOD (ROUTINE X 2)
Culture: NO GROWTH
Culture: NO GROWTH
Special Requests: ADEQUATE
Special Requests: ADEQUATE

## 2019-01-13 MED ORDER — FUROSEMIDE 40 MG PO TABS
40.0000 mg | ORAL_TABLET | Freq: Every day | ORAL | Status: DC
  Administered 2019-01-13 – 2019-01-14 (×2): 40 mg via ORAL
  Filled 2019-01-13 (×2): qty 1

## 2019-01-13 NOTE — Progress Notes (Signed)
PROGRESS NOTE  MANVILLE WOLAVER [id number] DOB: 1965/04/28 DOA: 01/08/2019 PCP: [name], MD  Brief History: 53 year old male with a history of chronic respiratory failure on 3 L, COPD, asbestosis, chronic back pain on chronic opioids presenting with left-sided chest pain for the last 3 weeks. He describes it as a sharp constant and severe worsening with coughing and inspiration. He denies any recent injuries or falls. He denies any hemoptysis but has been having coughing with yellow-brown sputum. He denies any fevers, chills, nausea, vomiting diarrhea, abdominal pain, dysuria, hematuria. He states that he has been taking some over-the-counter pain relievers, but is not clear whether he has been taking acetaminophen or NSAIDs. He denies overusing his opioids at home. He has had some mild worsening of his shortness of breath and wheezing over the past 2 to 3 days. He smokes electronic cigarettes for the last 4 years. However he is exposed to his wife's tobacco smoking at home. He denies any diaphoresis, worsening lower extreme edema, orthopnea, PND. In the emergency department, the patient was afebrile hemodynamically stable with oxygen saturation 96 to 100% on 3 L. BMP and CBC were essentially unremarkable. Magnesium 2.6. CT angiogram of the chest was negative for pulmonary embolus. It showed severe bullous emphysema with diffuse bilateral bronchial wall thickening. There was evidence of bilateral bulla reduction. Chest x-ray showed slight worsening of nodular opacity in the left lateral lung. The patient was started on ceftriaxone and azithromycin.  He was also started on IV solumedrol and BDs for his COPD exacerbation.  Assessment/Plan: Community-acquired pneumonia -Continue ceftriaxone and azithromycin -Check procalcitonin<0.10 -Urine Legionella antigen--neg -Urine Streptococcus pneumoniae antigen--neg -SARS-CoV2--neg  Atypical chest pain -Likely  combination of muscular skeletal strain aswell as viscerosomatic response from his pneumonia -Troponins negative x2 -07/18/2016 heart catheterization--normal coronaries -Personally reviewed EKG--sinus rhythm, nonspecific T wave changes  -ContninueRobaxin -continue IV dilaudid -continue IV toradol  COPD exacerbation -ContinuePulmicort -Continue duo nebs -ContinueBrovana -continue mucomyst -viral respiratory panel--neg -continueIV solumedrol--increased to 60 mg q 6hr  Chronic pain syndrome -Continue home dose of MS Contin -Holding home dose of oxycodone while patient is receiving IV Dilaudid -reviewed PMP AWARE--patient receives monthly MS Contin30 mg #90,oxycodone30 mg #90,and Lyrica 150 mg#72from one provider -UDS--positive opiate  Chronic respiratory failure with hypoxia -Chronically on 3 L nasal cannula  Uncontrolled left rib pain -troponin neg x 2 -personally reviewed EKG--sinus, nonspecific T wave change -continue dilaudid to 1.5mg  q 2 hrs prn -11/4 CTA chest--neg PE, severe bullous emphysema, diffuse bronchial wall thickening bilateral -continue home dose lyrica, MS Contin -increasedamitriptyline at hs -continuetoradol IV  Leg pain and edema -venous duplex--neg -start lasix po   Disposition Plan: Home 01/14/19 Family Communication:NoFamily at bedside  Consultants:none  Code Status: FULL   DVT Prophylaxis: [place] Lovenox   Procedures: As Listed in Progress Note Above  Antibiotics: Ceftriaxone 11/4>>> azithro11/4>>>  Disposition Plan: Home 11/9 if stable  Family Communication:NoFamily at bedside  Consultants:none  Code Status: FULL   DVT Prophylaxis: [place] Lovenox   Procedures: As Listed in Progress Note Above  Antibiotics: Ceftriaxone 11/4>>>11/10 azithro11/4>>>11/10     Subjective: Pt states he is breathing better.  Feels his left rib pain is improving.  Denies f/c, cp, n/v/d, abd  pain  Objective: Vitals:   01/13/19 0744 01/13/19 0746 01/13/19 1407 01/13/19 1433  BP:   128/77   Pulse:   (!) 108   Resp:   20   Temp:   98.1 F (36.7  C)   TempSrc:   Oral   SpO2: 97% 97% 98% 98%  Weight:      Height:        Intake/Output Summary (Last 24 hours) at 01/13/2019 1649 Last data filed at 01/13/2019 1500 Gross per 24 hour  Intake 1500 ml  Output 1602 ml  Net -102 ml   Weight change:  Exam:   General:  Pt is alert, follows commands appropriately, not in acute distress  HEENT: No icterus, No thrush, No neck mass, [place]/AT  Cardiovascular: RRR, S1/S2, no rubs, no gallops  Respiratory: scattered rales.  Bibasilar wheeze  Abdomen: Soft/+BS, non tender, non distended, no guarding  Extremities: 1 +LE edema, No lymphangitis, No petechiae, No rashes, no synovitis   Data Reviewed: I have personally reviewed following labs and imaging studies Basic Metabolic Panel: Recent Labs  Lab 01/08/19 1249 01/08/19 1440 01/09/19 0118 01/10/19 0658 01/12/19 0653 01/13/19 0613  NA 139  --  137 140 138 138  K 3.9  --  5.1 4.7 4.4 4.9  CL 102  --  104 101 95* 97*  CO2 28  --  28 31 30  33*  GLUCOSE 112*  --  136* 148* 155* 129*  BUN 11  --  14 17 24* 31*  CREATININE 0.87  --  0.70 0.56* 0.66 0.65  CALCIUM 9.2  --  8.6* 9.1 9.3 8.8*  MG  --  2.8* 2.6* 2.4 2.3  --    Liver Function Tests: No results for input(s): AST, ALT, ALKPHOS, BILITOT, PROT, ALBUMIN in the last 168 hours. No results for input(s): LIPASE, AMYLASE in the last 168 hours. No results for input(s): AMMONIA in the last 168 hours. Coagulation Profile: No results for input(s): INR, PROTIME in the last 168 hours. CBC: Recent Labs  Lab 01/08/19 1249 01/09/19 0118 01/10/19 0658 01/12/19 0653  WBC 7.7 8.0 13.5* 15.6*  NEUTROABS 5.5  --   --   --   HGB 13.9 12.5* 12.2* 12.2*  HCT 43.9 40.1 39.8 39.2  MCV 99.3 101.0* 102.1* 100.3*  PLT 350 308 352 377   Cardiac Enzymes: No results for input(s):  CKTOTAL, CKMB, CKMBINDEX, TROPONINI in the last 168 hours. BNP: Invalid input(s): POCBNP CBG: Recent Labs  Lab 01/10/19 0750 01/10/19 1134  GLUCAP 120* 156*   HbA1C: No results for input(s): HGBA1C in the last 72 hours. Urine analysis:    Component Value Date/Time   COLORURINE YELLOW 01/17/2017 0210   APPEARANCEUR CLEAR 01/17/2017 0210   LABSPEC >1.046 (H) 01/17/2017 0210   PHURINE 5.0 01/17/2017 0210   GLUCOSEU NEGATIVE 01/17/2017 0210   HGBUR NEGATIVE 01/17/2017 0210   BILIRUBINUR NEGATIVE 01/17/2017 0210   KETONESUR NEGATIVE 01/17/2017 0210   PROTEINUR NEGATIVE 01/17/2017 0210   UROBILINOGEN 0.2 05/23/2014 0102   NITRITE NEGATIVE 01/17/2017 0210   LEUKOCYTESUR NEGATIVE 01/17/2017 0210   Sepsis Labs: @LABRCNTIP (procalcitonin:4,lacticidven:4) ) Recent Results (from the past 240 hour(s))  SARS CORONAVIRUS 2 (Nehemie Casserly 6-24 HRS) Nasopharyngeal Nasopharyngeal Swab     Status: None   Collection Time: 01/08/19  1:43 PM   Specimen: Nasopharyngeal Swab  Result Value Ref Range Status   SARS Coronavirus 2 NEGATIVE NEGATIVE Final    Comment: (NOTE) SARS-CoV-2 target nucleic acids are NOT DETECTED. The SARS-CoV-2 RNA is generally detectable in upper and lower respiratory specimens during the acute phase of infection. Negative results do not preclude SARS-CoV-2 infection, do not rule out co-infections with other pathogens, and should not be used as the sole basis for treatment  or other patient management decisions. Negative results must be combined with clinical observations, patient history, and epidemiological information. The expected result is Negative. Fact Sheet for Patients: SugarRoll.be Fact Sheet for Healthcare Providers: https://www.woods-mathews.com/ This test is not yet approved or cleared by the [place] FDA and  has been authorized for detection and/or diagnosis of SARS-CoV-2 by FDA under an Emergency Use Authorization  (EUA). This EUA will remain  in effect (meaning this test can be used) for the duration of the COVID-19 declaration under Section 56 4(b)(1) of the Act, 21 U.S.C. section 360bbb-3(b)(1), unless the authorization is terminated or revoked sooner. Performed at [facility], [place] [address] [place], [place] [address]   Blood culture (routine x 2)     Status: None   Collection Time: 01/08/19  2:40 PM   Specimen: BLOOD LEFT ARM  Result Value Ref Range Status   Specimen Description BLOOD LEFT ARM  Final   Special Requests   Final    BOTTLES DRAWN AEROBIC AND ANAEROBIC Blood Culture adequate volume   Culture   Final    NO GROWTH 5 DAYS Performed at [facility], [address], [place], [place] [address]    Report Status 01/13/2019 FINAL  Final  Blood culture (routine x 2)     Status: None   Collection Time: 01/08/19  2:41 PM   Specimen: BLOOD RIGHT ARM  Result Value Ref Range Status   Specimen Description BLOOD RIGHT ARM  Final   Special Requests   Final    BOTTLES DRAWN AEROBIC AND ANAEROBIC Blood Culture adequate volume   Culture   Final    NO GROWTH 5 DAYS Performed at [facility], [address], [place], [place] [address]    Report Status 01/13/2019 FINAL  Final  Respiratory Panel by PCR     Status: None   Collection Time: 01/10/19 11:26 AM   Specimen: Nasopharyngeal Swab; Respiratory  Result Value Ref Range Status   Adenovirus NOT DETECTED NOT DETECTED Final   Coronavirus 229E NOT DETECTED NOT DETECTED Final    Comment: (NOTE) The Coronavirus on the Respiratory Panel, DOES NOT test for the novel  Coronavirus (2019 nCoV)    Coronavirus HKU1 NOT DETECTED NOT DETECTED Final   Coronavirus NL63 NOT DETECTED NOT DETECTED Final   Coronavirus OC43 NOT DETECTED NOT DETECTED Final   Metapneumovirus NOT DETECTED NOT DETECTED Final   Rhinovirus / Enterovirus NOT DETECTED NOT DETECTED Final   Influenza A NOT DETECTED NOT DETECTED Final   Influenza B NOT DETECTED NOT DETECTED  Final   Parainfluenza Virus 1 NOT DETECTED NOT DETECTED Final   Parainfluenza Virus 2 NOT DETECTED NOT DETECTED Final   Parainfluenza Virus 3 NOT DETECTED NOT DETECTED Final   Parainfluenza Virus 4 NOT DETECTED NOT DETECTED Final   Respiratory Syncytial Virus NOT DETECTED NOT DETECTED Final   Bordetella pertussis NOT DETECTED NOT DETECTED Final   Chlamydophila pneumoniae NOT DETECTED NOT DETECTED Final   Mycoplasma pneumoniae NOT DETECTED NOT DETECTED Final    Comment: Performed at [facility], [place] [address] [place], [place] [address]     Scheduled Meds:  acetylcysteine  2 mL Nebulization BID   amitriptyline  50 mg Oral QHS   arformoterol  15 mcg Nebulization BID   aspirin  81 mg Oral Daily   azithromycin  500 mg Oral Q24H   budesonide (PULMICORT) nebulizer solution  0.5 mg Nebulization BID   enoxaparin (LOVENOX) injection  40 mg Subcutaneous Q24H  feeding supplement (ENSURE ENLIVE)  237 mL Oral BID BM   furosemide  40 mg Oral Daily   ipratropium-albuterol  3 mL Nebulization Q6H   ketorolac  15 mg Intravenous Q6H   methocarbamol  500 mg Oral QID   methylPREDNISolone (SOLU-MEDROL) injection  60 mg Intravenous Q6H   montelukast  10 mg Oral Daily   morphine  30 mg Oral Q8H   nicotine  14 mg Transdermal Daily   pneumococcal 23 valent vaccine  0.5 mL Intramuscular Tomorrow-1000   polyethylene glycol  17 g Oral Daily   pregabalin  150 mg Oral TID   Continuous Infusions:  cefTRIAXone (ROCEPHIN)  IV 1 g (01/13/19 0827)    Procedures/Studies: Dg Chest 2 View  Result Date: 01/08/2019 CLINICAL DATA:  53 year old male with a history of shortness of breath EXAM: CHEST - 2 VIEW COMPARISON:  01/17/2017, 01/06/2019 FINDINGS: Cardiomediastinal silhouette unchanged in size and contour. No evidence of central vascular congestion. Stigmata of emphysema, with increased retrosternal airspace, flattened hemidiaphragms, increased AP diameter, and hyperinflation on the AP  view. Architectural distortion in reticular opacities in the mid lungs bilaterally, similar to the prior including linear opacities at the superior left lung. Developing nodular opacity of the left lateral lung appears slightly worsened from the comparison plain film of [facility] rocking him. No pleural effusion or pneumothorax. No displaced fracture with degenerative changes of the spine IMPRESSION: Advanced emphysema and architectural distortion, with slight worsening of nodular opacity of the left lateral lung concerning for developing pneumonia. Followup PA and lateral chest X-ray is recommended in 3-4 weeks following trial of antibiotic therapy to ensure resolution and exclude underlying malignancy. Electronically Signed   By: Corrie Mckusick D.O.   On: 01/08/2019 13:33   Ct Angio Chest Pe W And/or Wo Contrast  Result Date: 01/08/2019 CLINICAL DATA:  PE suspected EXAM: CT ANGIOGRAPHY CHEST WITH CONTRAST TECHNIQUE: Multidetector CT imaging of the chest was performed using the standard protocol during bolus administration of intravenous contrast. Multiplanar CT image reconstructions and MIPs were obtained to evaluate the vascular anatomy. CONTRAST:  123mL OMNIPAQUE IOHEXOL 350 MG/ML SOLN COMPARISON:  07/28/2016, 04/01/2016 FINDINGS: Cardiovascular: Satisfactory opacification of the pulmonary arteries to the segmental level. No evidence of pulmonary embolism. Normal heart size. No pericardial effusion. Mediastinum/Nodes: No enlarged mediastinal, hilar, or axillary lymph nodes. Thyroid gland, trachea, and esophagus demonstrate no significant findings. Lungs/Pleura: Severe, bullous emphysema. Diffuse bilateral bronchial wall thickening. Stable scarring of the anterior left upper lobe and lingula. Probable bulla reduction surgery bilaterally. No pleural effusion or pneumothorax. Upper Abdomen: No acute abnormality. Unchanged benign, fat containing left adrenal adenoma. Musculoskeletal: No chest wall abnormality. No acute  or significant osseous findings. Review of the MIP images confirms the above findings. IMPRESSION: 1.  Negative examination for pulmonary embolism. 2. Diffuse bilateral bronchial wall thickening, consistent with infectious or inflammatory bronchitis. 3.  Severe, bullous emphysema.  Emphysema (ICD10-J43.9). Electronically Signed   By: Eddie Candle M.D.   On: 01/08/2019 16:04   US Venous Img Lower Bilateral (dvt)  Result Date: 01/13/2019 CLINICAL DATA:  53 year old male with a history of bilateral leg edema EXAM: BILATERAL LOWER EXTREMITY VENOUS DOPPLER ULTRASOUND TECHNIQUE: Gray-scale sonography with graded compression, as well as color Doppler and duplex ultrasound were performed to evaluate the lower extremity deep venous systems from the level of the common femoral vein and including the common femoral, femoral, profunda femoral, popliteal and calf veins including the posterior tibial, peroneal and gastrocnemius veins when visible. The superficial great saphenous  vein was also interrogated. Spectral Doppler was utilized to evaluate flow at rest and with distal augmentation maneuvers in the common femoral, femoral and popliteal veins. COMPARISON:  None. FINDINGS: RIGHT LOWER EXTREMITY Common Femoral Vein: No evidence of thrombus. Normal compressibility, respiratory phasicity and response to augmentation. Saphenofemoral Junction: No evidence of thrombus. Normal compressibility and flow on color Doppler imaging. Profunda Femoral Vein: No evidence of thrombus. Normal compressibility and flow on color Doppler imaging. Femoral Vein: No evidence of thrombus. Normal compressibility, respiratory phasicity and response to augmentation. Popliteal Vein: No evidence of thrombus. Normal compressibility, respiratory phasicity and response to augmentation. Calf Veins: No evidence of thrombus. Normal compressibility and flow on color Doppler imaging. Superficial Great Saphenous Vein: No evidence of thrombus. Normal  compressibility and flow on color Doppler imaging. Other Findings:  Edema LEFT LOWER EXTREMITY Common Femoral Vein: No evidence of thrombus. Normal compressibility, respiratory phasicity and response to augmentation. Saphenofemoral Junction: No evidence of thrombus. Normal compressibility and flow on color Doppler imaging. Profunda Femoral Vein: No evidence of thrombus. Normal compressibility and flow on color Doppler imaging. Femoral Vein: No evidence of thrombus. Normal compressibility, respiratory phasicity and response to augmentation. Popliteal Vein: No evidence of thrombus. Normal compressibility, respiratory phasicity and response to augmentation. Calf Veins: No evidence of thrombus. Normal compressibility and flow on color Doppler imaging. Superficial Great Saphenous Vein: No evidence of thrombus. Normal compressibility and flow on color Doppler imaging. Other Findings:  Edema IMPRESSION: Sonographic survey of the bilateral lower extremity negative for DVT Bilateral lower extremity edema Electronically Signed   By: Corrie Mckusick D.O.   On: 01/13/2019 07:46    Orson Eva, DO  [facility] Pager [phone]  If 7PM-7AM, please contact night-coverage [email or web address] Password [email or web address] 01/13/2019, 4:49 PM   LOS: 5 days

## 2019-01-14 LAB — BASIC METABOLIC PANEL
Anion gap: 10 (ref 5–15)
BUN: 34 mg/dL — ABNORMAL HIGH (ref 6–20)
CO2: 36 mmol/L — ABNORMAL HIGH (ref 22–32)
Calcium: 8.8 mg/dL — ABNORMAL LOW (ref 8.9–10.3)
Chloride: 93 mmol/L — ABNORMAL LOW (ref 98–111)
Creatinine, Ser: 0.67 mg/dL (ref 0.61–1.24)
GFR calc Af Amer: >60 mL/min (ref >60)
GFR calc non Af Amer: >60 mL/min (ref >60)
Glucose, Bld: 125 mg/dL — ABNORMAL HIGH (ref 70–99)
Potassium: 4.5 mmol/L (ref 3.5–5.1)
Sodium: 139 mmol/L (ref 135–145)

## 2019-01-14 LAB — GLUCOSE, CAPILLARY: Glucose-Capillary: 120 mg/dL — ABNORMAL HIGH (ref 70–99)

## 2019-01-14 MED ORDER — PREDNISONE 10 MG PO TABS: 60.0000 mg | ORAL_TABLET | Freq: Every day | ORAL | 0 refills | Status: DC

## 2019-01-14 MED ORDER — METHOCARBAMOL 500 MG PO TABS: 500.0000 mg | ORAL_TABLET | Freq: Four times a day (QID) | ORAL | 0 refills | Status: DC

## 2019-01-14 NOTE — Discharge Summary (Signed)
Physician Discharge Summary  Wesley [name] PFY:[id number] DOB: [birth year] DOA: 01/08/2019  PCP: [name], MD  Admit date: 01/08/2019 Discharge date: 01/14/2019  Admitted From: Home Disposition:  Home   Recommendations for Outpatient Follow-up:  1. Follow up with PCP in 1-2 weeks 2. Please obtain BMP/CBC in one week    Equipment/Devices: 4L  Discharge Condition: Stable CODE STATUS: FULL Diet recommendation: Heart Healthy    Brief/Interim Summary: 53 year old male with a history of chronic respiratory failure on 3 L, COPD, asbestosis, chronic back pain on chronic opioids presenting with left-sided chest pain for the last 3 weeks. He describes it as a sharp constant and severe worsening with coughing and inspiration. He denies any recent injuries or falls. He denies any hemoptysis but has been having coughing with yellow-brown sputum. He denies any fevers, chills, nausea, vomiting diarrhea, abdominal pain, dysuria, hematuria. He states that he has been taking some over-the-counter pain relievers, but is not clear whether he has been taking acetaminophen or NSAIDs. He denies overusing his opioids at home. He has had some mild worsening of his shortness of breath and wheezing over the past 2 to 3 days. He smokes electronic cigarettes for the last 4 years. However he is exposed to his wife's tobacco smoking at home. He denies any diaphoresis, worsening lower extreme edema, orthopnea, PND. In the emergency department, the patient was afebrile hemodynamically stable with oxygen saturation 96 to 100% on 3 L. BMP and CBC were essentially unremarkable. Magnesium 2.6. CT angiogram of the chest was negative for pulmonary embolus. It showed severe bullous emphysema with diffuse bilateral bronchial wall thickening. There was evidence of bilateral bulla reduction. Chest x-ray showed slight worsening of nodular opacity in the left lateral lung. The patient was started on  ceftriaxone and azithromycin.  He was also started on IV solumedrol and BDs for his COPD exacerbation.  He was started on IV dilaudid and toradol for his rib/left chest pain.  Robaxin and amitriptyline were added.  His pain gradually improved and sob and wheezing also improved.    Discharge Diagnoses:  Community-acquired pneumonia -Continue ceftriaxone and azithromycin -Check procalcitonin<0.10 -Urine Legionella antigen--neg -Urine Streptococcus pneumoniae antigen--neg -SARS-CoV2--neg  Atypical chest pain -Likely combination of muscular skeletal strain aswell as viscerosomatic response from his pneumonia -Troponins negative x2 -07/18/2016 heart catheterization--normal coronaries -Personally reviewed EKG--sinus rhythm, nonspecific T wave changes  -ContninuedRobaxin -continued IV dilaudid -continued IV toradol  COPD exacerbation -ContinuedPulmicort -Continued duo nebs -ContinuedBrovana -continued mucomyst -viral respiratory panel--neg -continueIV solumedrol--increased to 60 mg q 6hr -discharge home with prednisone taper  Chronic pain syndrome -Continue home dose of MS Contin -Holding home dose of oxycodone while patient is receiving IV Dilaudid -reviewed PMP AWARE--patient receives monthly MS Contin30 mg #90,oxycodone30 mg #90,and Lyrica 150 mg#43fom one provider -UDS--positive opiate  Chronic respiratory failure with hypoxia -Chronically on 3 L nasal cannula  Uncontrolled left rib pain -troponin neg x 2 -personally reviewed EKG--sinus, nonspecific T wave change -continue dilaudid to 1.550mq 2 hrs prn -11/4 CTA chest--neg PE, severe bullous emphysema, diffuse bronchial wall thickening bilateral -continue home dose lyrica, MS Contin -increasedamitriptyline at hs -continuetoradol IV -overall pain gradually improved  Leg pain and edema -venous duplex--neg -restarted lasix po   Discharge Instructions   Allergies as of 01/14/2019      Reactions     Ambien [zolpidem Tartrate] Other (See Comments)   hallucinations   Melatonin Itching   Zolpidem Other (See Comments)   Altered mental status  Medication List    STOP taking these medications   penicillin v potassium 500 MG tablet Commonly known as: VEETID     TAKE these medications   acetaminophen 325 MG tablet Commonly known as: TYLENOL Take 2 tablets by mouth every 4 (four) hours.   Advair Diskus 250-50 MCG/DOSE Aepb Generic drug: Fluticasone-Salmeterol Inhale 1 puff into the lungs 2 (two) times daily.   albuterol (2.5 MG/3ML) 0.083% nebulizer solution Commonly known as: PROVENTIL Take 3 mLs (2.5 mg total) by nebulization every 6 (six) hours as needed for wheezing or shortness of breath. What changed: when to take this   albuterol 108 (90 Base) MCG/ACT inhaler Commonly known as: VENTOLIN HFA Inhale 2 puffs into the lungs every 4 (four) hours as needed for wheezing or shortness of breath. Shortness of breath What changed: Another medication with the same name was changed. Make sure you understand how and when to take each.   aspirin 81 MG chewable tablet Chew 1 tablet (81 mg total) by mouth daily.   budesonide-formoterol 160-4.5 MCG/ACT inhaler Commonly known as: SYMBICORT Inhale 3 puffs into the lungs 4 (four) times daily.   furosemide 40 MG tablet Commonly known as: LASIX Take 40 mg by mouth daily.   gabapentin 300 MG capsule Commonly known as: NEURONTIN Take 600 mg by mouth 3 (three) times daily.   Incruse Ellipta 62.5 MCG/INH Aepb Generic drug: umeclidinium bromide Inhale 1 puff into the lungs daily.   loperamide 2 MG capsule Commonly known as: IMODIUM Take 1 capsule by mouth 4 (four) times daily.   methocarbamol 500 MG tablet Commonly known as: ROBAXIN Take 1 tablet (500 mg total) by mouth 4 (four) times daily.   montelukast 10 MG tablet Commonly known as: SINGULAIR Take 1 tablet by mouth daily.   morphine 30 MG 12 hr tablet Commonly known  as: MS CONTIN Take 30 mg by mouth every 8 (eight) hours.   mupirocin ointment 2 % Commonly known as: BACTROBAN Place 1 application into the nose daily.   Narcan 4 MG/0.1ML Liqd nasal spray kit Generic drug: naloxone Place 4 mg into the nose as directed. To prevent overdose   ondansetron 4 MG disintegrating tablet Commonly known as: ZOFRAN-ODT Take 1 tablet by mouth every 8 (eight) hours as needed.   OxyCONTIN 30 MG 12 hr tablet Generic drug: oxyCODONE Take 30 mg by mouth every 8 (eight) hours. What changed: Another medication with the same name was removed. Continue taking this medication, and follow the directions you see here.   predniSONE 10 MG tablet Commonly known as: DELTASONE Take 6 tablets (60 mg total) by mouth daily with breakfast. And decrease by one tablet daily   pregabalin 150 MG capsule Commonly known as: LYRICA Take 1 capsule by mouth 3 (three) times daily.   Systane 0.4-0.3 % Soln Generic drug: Polyethyl Glycol-Propyl Glycol Place 1-2 drops into the left eye daily as needed (for dry eye relief).   tiotropium 18 MCG inhalation capsule Commonly known as: SPIRIVA Place 1 capsule (18 mcg total) into inhaler and inhale daily.      Follow-up Information    Medicine, [facility] Follow up on 01/21/2019.   Specialty: Internal Medicine Why: Please follow up with Dr. Sherrie Sport on Tuesday, November 17th at 2:30pm. Contact information: [address] [place] [address] [phone]          Allergies  Allergen Reactions   Ambien [Zolpidem Tartrate] Other (See Comments)    hallucinations   Melatonin Itching  Zolpidem Other (See Comments)    Altered mental status    Consultations:  none   Procedures/Studies: Dg Chest 2 View  Result Date: 01/08/2019 CLINICAL DATA:  53 year old male with a history of shortness of breath EXAM: CHEST - 2 VIEW COMPARISON:  01/17/2017, 01/06/2019 FINDINGS: Cardiomediastinal silhouette unchanged in size  and contour. No evidence of central vascular congestion. Stigmata of emphysema, with increased retrosternal airspace, flattened hemidiaphragms, increased AP diameter, and hyperinflation on the AP view. Architectural distortion in reticular opacities in the mid lungs bilaterally, similar to the prior including linear opacities at the superior left lung. Developing nodular opacity of the left lateral lung appears slightly worsened from the comparison plain film of [facility] rocking him. No pleural effusion or pneumothorax. No displaced fracture with degenerative changes of the spine IMPRESSION: Advanced emphysema and architectural distortion, with slight worsening of nodular opacity of the left lateral lung concerning for developing pneumonia. Followup PA and lateral chest X-ray is recommended in 3-4 weeks following trial of antibiotic therapy to ensure resolution and exclude underlying malignancy. Electronically Signed   By: Corrie Mckusick D.O.   On: 01/08/2019 13:33   Ct Angio Chest Pe W And/or Wo Contrast  Result Date: 01/08/2019 CLINICAL DATA:  PE suspected EXAM: CT ANGIOGRAPHY CHEST WITH CONTRAST TECHNIQUE: Multidetector CT imaging of the chest was performed using the standard protocol during bolus administration of intravenous contrast. Multiplanar CT image reconstructions and MIPs were obtained to evaluate the vascular anatomy. CONTRAST:  190m OMNIPAQUE IOHEXOL 350 MG/ML SOLN COMPARISON:  07/28/2016, 04/01/2016 FINDINGS: Cardiovascular: Satisfactory opacification of the pulmonary arteries to the segmental level. No evidence of pulmonary embolism. Normal heart size. No pericardial effusion. Mediastinum/Nodes: No enlarged mediastinal, hilar, or axillary lymph nodes. Thyroid gland, trachea, and esophagus demonstrate no significant findings. Lungs/Pleura: Severe, bullous emphysema. Diffuse bilateral bronchial wall thickening. Stable scarring of the anterior left upper lobe and lingula. Probable bulla reduction  surgery bilaterally. No pleural effusion or pneumothorax. Upper Abdomen: No acute abnormality. Unchanged benign, fat containing left adrenal adenoma. Musculoskeletal: No chest wall abnormality. No acute or significant osseous findings. Review of the MIP images confirms the above findings. IMPRESSION: 1.  Negative examination for pulmonary embolism. 2. Diffuse bilateral bronchial wall thickening, consistent with infectious or inflammatory bronchitis. 3.  Severe, bullous emphysema.  Emphysema (ICD10-J43.9). Electronically Signed   By: AEddie CandleM.D.   On: 01/08/2019 16:04   UKoreaVenous Img Lower Bilateral (dvt)  Result Date: 01/13/2019 CLINICAL DATA:  53year old male with a history of bilateral leg edema EXAM: BILATERAL LOWER EXTREMITY VENOUS DOPPLER ULTRASOUND TECHNIQUE: Gray-scale sonography with graded compression, as well as color Doppler and duplex ultrasound were performed to evaluate the lower extremity deep venous systems from the level of the common femoral vein and including the common femoral, femoral, profunda femoral, popliteal and calf veins including the posterior tibial, peroneal and gastrocnemius veins when visible. The superficial great saphenous vein was also interrogated. Spectral Doppler was utilized to evaluate flow at rest and with distal augmentation maneuvers in the common femoral, femoral and popliteal veins. COMPARISON:  None. FINDINGS: RIGHT LOWER EXTREMITY Common Femoral Vein: No evidence of thrombus. Normal compressibility, respiratory phasicity and response to augmentation. Saphenofemoral Junction: No evidence of thrombus. Normal compressibility and flow on color Doppler imaging. Profunda Femoral Vein: No evidence of thrombus. Normal compressibility and flow on color Doppler imaging. Femoral Vein: No evidence of thrombus. Normal compressibility, respiratory phasicity and response to augmentation. Popliteal Vein: No evidence of thrombus. Normal compressibility, respiratory phasicity  and response to augmentation. Calf Veins: No evidence of thrombus. Normal compressibility and flow on color Doppler imaging. Superficial Great Saphenous Vein: No evidence of thrombus. Normal compressibility and flow on color Doppler imaging. Other Findings:  Edema LEFT LOWER EXTREMITY Common Femoral Vein: No evidence of thrombus. Normal compressibility, respiratory phasicity and response to augmentation. Saphenofemoral Junction: No evidence of thrombus. Normal compressibility and flow on color Doppler imaging. Profunda Femoral Vein: No evidence of thrombus. Normal compressibility and flow on color Doppler imaging. Femoral Vein: No evidence of thrombus. Normal compressibility, respiratory phasicity and response to augmentation. Popliteal Vein: No evidence of thrombus. Normal compressibility, respiratory phasicity and response to augmentation. Calf Veins: No evidence of thrombus. Normal compressibility and flow on color Doppler imaging. Superficial Great Saphenous Vein: No evidence of thrombus. Normal compressibility and flow on color Doppler imaging. Other Findings:  Edema IMPRESSION: Sonographic survey of the bilateral lower extremity negative for DVT Bilateral lower extremity edema Electronically Signed   By: Corrie Mckusick D.O.   On: 01/13/2019 07:46         Discharge Exam: Vitals:   01/14/19 0731 01/14/19 1334  BP:  114/67  Pulse:  (!) 119  Resp:  20  Temp:    SpO2: 96% 97%   Vitals:   01/14/19 0728 01/14/19 0729 01/14/19 0731 01/14/19 1334  BP:    114/67  Pulse:    (!) 119  Resp:    20  Temp:      TempSrc:      SpO2: 95% 96% 96% 97%  Weight:      Height:        General: Pt is alert, awake, not in acute distress Cardiovascular: RRR, S1/S2 +, no rubs, no gallops Respiratory: diminished breath sounds bilateral.  Minimal bibasilar wheeze Abdominal: Soft, NT, ND, bowel sounds + Extremities: 1 + LE edema, no cyanosis   The results of significant diagnostics from this hospitalization  (including imaging, microbiology, ancillary and laboratory) are listed below for reference.    Significant Diagnostic Studies: Dg Chest 2 View  Result Date: 01/08/2019 CLINICAL DATA:  53 year old male with a history of shortness of breath EXAM: CHEST - 2 VIEW COMPARISON:  01/17/2017, 01/06/2019 FINDINGS: Cardiomediastinal silhouette unchanged in size and contour. No evidence of central vascular congestion. Stigmata of emphysema, with increased retrosternal airspace, flattened hemidiaphragms, increased AP diameter, and hyperinflation on the AP view. Architectural distortion in reticular opacities in the mid lungs bilaterally, similar to the prior including linear opacities at the superior left lung. Developing nodular opacity of the left lateral lung appears slightly worsened from the comparison plain film of [facility] rocking him. No pleural effusion or pneumothorax. No displaced fracture with degenerative changes of the spine IMPRESSION: Advanced emphysema and architectural distortion, with slight worsening of nodular opacity of the left lateral lung concerning for developing pneumonia. Followup PA and lateral chest X-ray is recommended in 3-4 weeks following trial of antibiotic therapy to ensure resolution and exclude underlying malignancy. Electronically Signed   By: Corrie Mckusick D.O.   On: 01/08/2019 13:33   Ct Angio Chest Pe W And/or Wo Contrast  Result Date: 01/08/2019 CLINICAL DATA:  PE suspected EXAM: CT ANGIOGRAPHY CHEST WITH CONTRAST TECHNIQUE: Multidetector CT imaging of the chest was performed using the standard protocol during bolus administration of intravenous contrast. Multiplanar CT image reconstructions and MIPs were obtained to evaluate the vascular anatomy. CONTRAST:  169m OMNIPAQUE IOHEXOL 350 MG/ML SOLN COMPARISON:  07/28/2016, 04/01/2016 FINDINGS: Cardiovascular: Satisfactory opacification of the pulmonary arteries to  the segmental level. No evidence of pulmonary embolism. Normal heart  size. No pericardial effusion. Mediastinum/Nodes: No enlarged mediastinal, hilar, or axillary lymph nodes. Thyroid gland, trachea, and esophagus demonstrate no significant findings. Lungs/Pleura: Severe, bullous emphysema. Diffuse bilateral bronchial wall thickening. Stable scarring of the anterior left upper lobe and lingula. Probable bulla reduction surgery bilaterally. No pleural effusion or pneumothorax. Upper Abdomen: No acute abnormality. Unchanged benign, fat containing left adrenal adenoma. Musculoskeletal: No chest wall abnormality. No acute or significant osseous findings. Review of the MIP images confirms the above findings. IMPRESSION: 1.  Negative examination for pulmonary embolism. 2. Diffuse bilateral bronchial wall thickening, consistent with infectious or inflammatory bronchitis. 3.  Severe, bullous emphysema.  Emphysema (ICD10-J43.9). Electronically Signed   By: Eddie Candle M.D.   On: 01/08/2019 16:04   US Venous Img Lower Bilateral (dvt)  Result Date: 01/13/2019 CLINICAL DATA:  53 year old male with a history of bilateral leg edema EXAM: BILATERAL LOWER EXTREMITY VENOUS DOPPLER ULTRASOUND TECHNIQUE: Gray-scale sonography with graded compression, as well as color Doppler and duplex ultrasound were performed to evaluate the lower extremity deep venous systems from the level of the common femoral vein and including the common femoral, femoral, profunda femoral, popliteal and calf veins including the posterior tibial, peroneal and gastrocnemius veins when visible. The superficial great saphenous vein was also interrogated. Spectral Doppler was utilized to evaluate flow at rest and with distal augmentation maneuvers in the common femoral, femoral and popliteal veins. COMPARISON:  None. FINDINGS: RIGHT LOWER EXTREMITY Common Femoral Vein: No evidence of thrombus. Normal compressibility, respiratory phasicity and response to augmentation. Saphenofemoral Junction: No evidence of thrombus. Normal  compressibility and flow on color Doppler imaging. Profunda Femoral Vein: No evidence of thrombus. Normal compressibility and flow on color Doppler imaging. Femoral Vein: No evidence of thrombus. Normal compressibility, respiratory phasicity and response to augmentation. Popliteal Vein: No evidence of thrombus. Normal compressibility, respiratory phasicity and response to augmentation. Calf Veins: No evidence of thrombus. Normal compressibility and flow on color Doppler imaging. Superficial Great Saphenous Vein: No evidence of thrombus. Normal compressibility and flow on color Doppler imaging. Other Findings:  Edema LEFT LOWER EXTREMITY Common Femoral Vein: No evidence of thrombus. Normal compressibility, respiratory phasicity and response to augmentation. Saphenofemoral Junction: No evidence of thrombus. Normal compressibility and flow on color Doppler imaging. Profunda Femoral Vein: No evidence of thrombus. Normal compressibility and flow on color Doppler imaging. Femoral Vein: No evidence of thrombus. Normal compressibility, respiratory phasicity and response to augmentation. Popliteal Vein: No evidence of thrombus. Normal compressibility, respiratory phasicity and response to augmentation. Calf Veins: No evidence of thrombus. Normal compressibility and flow on color Doppler imaging. Superficial Great Saphenous Vein: No evidence of thrombus. Normal compressibility and flow on color Doppler imaging. Other Findings:  Edema IMPRESSION: Sonographic survey of the bilateral lower extremity negative for DVT Bilateral lower extremity edema Electronically Signed   By: Corrie Mckusick D.O.   On: 01/13/2019 07:46     Microbiology: Recent Results (from the past 240 hour(s))  SARS CORONAVIRUS 2 ([facility] 6-24 HRS) Nasopharyngeal Nasopharyngeal Swab     Status: None   Collection Time: 01/08/19  1:43 PM   Specimen: Nasopharyngeal Swab  Result Value Ref Range Status   SARS Coronavirus 2 NEGATIVE NEGATIVE Final    Comment:  (NOTE) SARS-CoV-2 target nucleic acids are NOT DETECTED. The SARS-CoV-2 RNA is generally detectable in upper and lower respiratory specimens during the acute phase of infection. Negative results do not preclude SARS-CoV-2 infection, do not rule out  co-infections with other pathogens, and should not be used as the sole basis for treatment or other patient management decisions. Negative results must be combined with clinical observations, patient history, and epidemiological information. The expected result is Negative. Fact Sheet for Patients: SugarRoll.be Fact Sheet for Healthcare Providers: https://www.woods-mathews.com/ This test is not yet approved or cleared by the [place] FDA and  has been authorized for detection and/or diagnosis of SARS-CoV-2 by FDA under an Emergency Use Authorization (EUA). This EUA will remain  in effect (meaning this test can be used) for the duration of the COVID-19 declaration under Section 56 4(b)(1) of the Act, 21 U.S.C. section 360bbb-3(b)(1), unless the authorization is terminated or revoked sooner. Performed at [facility], [place] [address] [place], [place] [address]   Blood culture (routine x 2)     Status: None   Collection Time: 01/08/19  2:40 PM   Specimen: BLOOD LEFT ARM  Result Value Ref Range Status   Specimen Description BLOOD LEFT ARM  Final   Special Requests   Final    BOTTLES DRAWN AEROBIC AND ANAEROBIC Blood Culture adequate volume   Culture   Final    NO GROWTH 5 DAYS Performed at [facility], [address], [place], [place] [address]    Report Status 01/13/2019 FINAL  Final  Blood culture (routine x 2)     Status: None   Collection Time: 01/08/19  2:41 PM   Specimen: BLOOD RIGHT ARM  Result Value Ref Range Status   Specimen Description BLOOD RIGHT ARM  Final   Special Requests   Final    BOTTLES DRAWN AEROBIC AND ANAEROBIC Blood Culture adequate volume   Culture   Final      NO GROWTH 5 DAYS Performed at [facility], [address] [place], [place] [address]    Report Status 01/13/2019 FINAL  Final  Respiratory Panel by PCR     Status: None   Collection Time: 01/10/19 11:26 AM   Specimen: Nasopharyngeal Swab; Respiratory  Result Value Ref Range Status   Adenovirus NOT DETECTED NOT DETECTED Final   Coronavirus 229E NOT DETECTED NOT DETECTED Final    Comment: (NOTE) The Coronavirus on the Respiratory Panel, DOES NOT test for the novel  Coronavirus (2019 nCoV)    Coronavirus HKU1 NOT DETECTED NOT DETECTED Final   Coronavirus NL63 NOT DETECTED NOT DETECTED Final   Coronavirus OC43 NOT DETECTED NOT DETECTED Final   Metapneumovirus NOT DETECTED NOT DETECTED Final   Rhinovirus / Enterovirus NOT DETECTED NOT DETECTED Final   Influenza A NOT DETECTED NOT DETECTED Final   Influenza B NOT DETECTED NOT DETECTED Final   Parainfluenza Virus 1 NOT DETECTED NOT DETECTED Final   Parainfluenza Virus 2 NOT DETECTED NOT DETECTED Final   Parainfluenza Virus 3 NOT DETECTED NOT DETECTED Final   Parainfluenza Virus 4 NOT DETECTED NOT DETECTED Final   Respiratory Syncytial Virus NOT DETECTED NOT DETECTED Final   Bordetella pertussis NOT DETECTED NOT DETECTED Final   Chlamydophila pneumoniae NOT DETECTED NOT DETECTED Final   Mycoplasma pneumoniae NOT DETECTED NOT DETECTED Final    Comment: Performed at [facility], [place] [address], [place], [place] [address]     Labs: Basic Metabolic Panel: Recent Labs  Lab 01/08/19 1440 01/09/19 0118 01/10/19 0658 01/12/19 0653 01/13/19 0613 01/14/19 0610  NA  --  137 140 138 138 139  K  --  5.1 4.7 4.4 4.9 4.5  CL  --  104 101 95*  97* 93*  CO2  --  28 31 30  33* 36*  GLUCOSE  --  136* 148* 155* 129* 125*  BUN  --  14 17 24* 31* 34*  CREATININE  --  0.70 0.56* 0.66 0.65 0.67  CALCIUM  --  8.6* 9.1 9.3 8.8* 8.8*  MG 2.8* 2.6* 2.4 2.3  --   --    Liver Function Tests: No results for input(s): AST, ALT, ALKPHOS,  BILITOT, PROT, ALBUMIN in the last 168 hours. No results for input(s): LIPASE, AMYLASE in the last 168 hours. No results for input(s): AMMONIA in the last 168 hours. CBC: Recent Labs  Lab 01/08/19 1249 01/09/19 0118 01/10/19 0658 01/12/19 0653  WBC 7.7 8.0 13.5* 15.6*  NEUTROABS 5.5  --   --   --   HGB 13.9 12.5* 12.2* 12.2*  HCT 43.9 40.1 39.8 39.2  MCV 99.3 101.0* 102.1* 100.3*  PLT 350 308 352 377   Cardiac Enzymes: No results for input(s): CKTOTAL, CKMB, CKMBINDEX, TROPONINI in the last 168 hours. BNP: Invalid input(s): POCBNP CBG: Recent Labs  Lab 01/10/19 0750 01/10/19 1134 01/14/19 0739  GLUCAP 120* 156* 120*    Time coordinating discharge:  36 minutes  Signed:  Orson Eva, DO [facility] Pager: [phone] 01/14/2019, 1:58 PM

## 2019-01-22 DIAGNOSIS — R269 Unspecified abnormalities of gait and mobility: Secondary | ICD-10-CM | POA: Diagnosis not present

## 2019-01-22 DIAGNOSIS — I279 Pulmonary heart disease, unspecified: Secondary | ICD-10-CM | POA: Diagnosis not present

## 2019-01-22 DIAGNOSIS — J441 Chronic obstructive pulmonary disease with (acute) exacerbation: Secondary | ICD-10-CM | POA: Diagnosis not present

## 2019-01-27 DIAGNOSIS — Z5181 Encounter for therapeutic drug level monitoring: Secondary | ICD-10-CM | POA: Diagnosis not present

## 2019-02-21 DIAGNOSIS — J441 Chronic obstructive pulmonary disease with (acute) exacerbation: Secondary | ICD-10-CM | POA: Diagnosis not present

## 2019-02-21 DIAGNOSIS — R269 Unspecified abnormalities of gait and mobility: Secondary | ICD-10-CM | POA: Diagnosis not present

## 2019-02-21 DIAGNOSIS — I279 Pulmonary heart disease, unspecified: Secondary | ICD-10-CM | POA: Diagnosis not present

## 2019-03-04 DIAGNOSIS — I471 Supraventricular tachycardia: Secondary | ICD-10-CM | POA: Diagnosis not present

## 2019-03-04 DIAGNOSIS — R0602 Shortness of breath: Secondary | ICD-10-CM | POA: Diagnosis not present

## 2019-03-04 DIAGNOSIS — G8929 Other chronic pain: Secondary | ICD-10-CM | POA: Diagnosis not present

## 2019-03-04 DIAGNOSIS — R69 Illness, unspecified: Secondary | ICD-10-CM | POA: Diagnosis not present

## 2019-03-04 DIAGNOSIS — Z0000 Encounter for general adult medical examination without abnormal findings: Secondary | ICD-10-CM | POA: Diagnosis not present

## 2019-03-04 DIAGNOSIS — Z1389 Encounter for screening for other disorder: Secondary | ICD-10-CM | POA: Diagnosis not present

## 2019-03-04 DIAGNOSIS — J441 Chronic obstructive pulmonary disease with (acute) exacerbation: Secondary | ICD-10-CM | POA: Diagnosis not present

## 2019-03-05 DIAGNOSIS — G8929 Other chronic pain: Secondary | ICD-10-CM | POA: Diagnosis not present

## 2019-03-05 DIAGNOSIS — J441 Chronic obstructive pulmonary disease with (acute) exacerbation: Secondary | ICD-10-CM | POA: Diagnosis not present

## 2019-03-05 DIAGNOSIS — R69 Illness, unspecified: Secondary | ICD-10-CM | POA: Diagnosis not present

## 2019-03-05 DIAGNOSIS — Z20822 Contact with and (suspected) exposure to covid-19: Secondary | ICD-10-CM | POA: Diagnosis not present

## 2019-03-05 DIAGNOSIS — I471 Supraventricular tachycardia: Secondary | ICD-10-CM | POA: Diagnosis not present

## 2019-03-17 DIAGNOSIS — G894 Chronic pain syndrome: Secondary | ICD-10-CM | POA: Diagnosis not present

## 2019-03-17 DIAGNOSIS — G8929 Other chronic pain: Secondary | ICD-10-CM | POA: Diagnosis not present

## 2019-03-17 DIAGNOSIS — M25551 Pain in right hip: Secondary | ICD-10-CM | POA: Diagnosis not present

## 2019-03-17 DIAGNOSIS — G8922 Chronic post-thoracotomy pain: Secondary | ICD-10-CM | POA: Diagnosis not present

## 2019-03-17 DIAGNOSIS — Z5181 Encounter for therapeutic drug level monitoring: Secondary | ICD-10-CM | POA: Diagnosis not present

## 2019-03-17 DIAGNOSIS — G8921 Chronic pain due to trauma: Secondary | ICD-10-CM | POA: Diagnosis not present

## 2019-03-17 DIAGNOSIS — Z79899 Other long term (current) drug therapy: Secondary | ICD-10-CM | POA: Diagnosis not present

## 2019-03-17 DIAGNOSIS — Z79891 Long term (current) use of opiate analgesic: Secondary | ICD-10-CM | POA: Diagnosis not present

## 2019-03-18 DIAGNOSIS — R269 Unspecified abnormalities of gait and mobility: Secondary | ICD-10-CM | POA: Diagnosis not present

## 2019-03-18 DIAGNOSIS — I279 Pulmonary heart disease, unspecified: Secondary | ICD-10-CM | POA: Diagnosis not present

## 2019-03-18 DIAGNOSIS — J441 Chronic obstructive pulmonary disease with (acute) exacerbation: Secondary | ICD-10-CM | POA: Diagnosis not present

## 2019-03-24 DIAGNOSIS — J441 Chronic obstructive pulmonary disease with (acute) exacerbation: Secondary | ICD-10-CM | POA: Diagnosis not present

## 2019-03-24 DIAGNOSIS — R269 Unspecified abnormalities of gait and mobility: Secondary | ICD-10-CM | POA: Diagnosis not present

## 2019-03-24 DIAGNOSIS — I279 Pulmonary heart disease, unspecified: Secondary | ICD-10-CM | POA: Diagnosis not present

## 2019-04-22 DIAGNOSIS — I279 Pulmonary heart disease, unspecified: Secondary | ICD-10-CM | POA: Diagnosis not present

## 2019-04-22 DIAGNOSIS — R269 Unspecified abnormalities of gait and mobility: Secondary | ICD-10-CM | POA: Diagnosis not present

## 2019-04-22 DIAGNOSIS — J441 Chronic obstructive pulmonary disease with (acute) exacerbation: Secondary | ICD-10-CM | POA: Diagnosis not present

## 2019-04-24 DIAGNOSIS — J441 Chronic obstructive pulmonary disease with (acute) exacerbation: Secondary | ICD-10-CM | POA: Diagnosis not present

## 2019-04-24 DIAGNOSIS — I279 Pulmonary heart disease, unspecified: Secondary | ICD-10-CM | POA: Diagnosis not present

## 2019-04-24 DIAGNOSIS — R269 Unspecified abnormalities of gait and mobility: Secondary | ICD-10-CM | POA: Diagnosis not present

## 2019-05-13 DIAGNOSIS — Z5181 Encounter for therapeutic drug level monitoring: Secondary | ICD-10-CM | POA: Diagnosis not present

## 2019-05-13 DIAGNOSIS — G8921 Chronic pain due to trauma: Secondary | ICD-10-CM | POA: Diagnosis not present

## 2019-05-13 DIAGNOSIS — G894 Chronic pain syndrome: Secondary | ICD-10-CM | POA: Diagnosis not present

## 2019-05-13 DIAGNOSIS — Z79891 Long term (current) use of opiate analgesic: Secondary | ICD-10-CM | POA: Diagnosis not present

## 2019-05-13 DIAGNOSIS — M25551 Pain in right hip: Secondary | ICD-10-CM | POA: Diagnosis not present

## 2019-05-13 DIAGNOSIS — Z79899 Other long term (current) drug therapy: Secondary | ICD-10-CM | POA: Diagnosis not present

## 2019-05-13 DIAGNOSIS — M25561 Pain in right knee: Secondary | ICD-10-CM | POA: Diagnosis not present

## 2019-05-13 DIAGNOSIS — G8922 Chronic post-thoracotomy pain: Secondary | ICD-10-CM | POA: Diagnosis not present

## 2019-05-13 DIAGNOSIS — G8929 Other chronic pain: Secondary | ICD-10-CM | POA: Diagnosis not present

## 2019-05-22 DIAGNOSIS — R269 Unspecified abnormalities of gait and mobility: Secondary | ICD-10-CM | POA: Diagnosis not present

## 2019-05-22 DIAGNOSIS — J441 Chronic obstructive pulmonary disease with (acute) exacerbation: Secondary | ICD-10-CM | POA: Diagnosis not present

## 2019-05-22 DIAGNOSIS — I279 Pulmonary heart disease, unspecified: Secondary | ICD-10-CM | POA: Diagnosis not present

## 2019-05-23 DIAGNOSIS — R269 Unspecified abnormalities of gait and mobility: Secondary | ICD-10-CM | POA: Diagnosis not present

## 2019-05-23 DIAGNOSIS — I279 Pulmonary heart disease, unspecified: Secondary | ICD-10-CM | POA: Diagnosis not present

## 2019-05-23 DIAGNOSIS — J441 Chronic obstructive pulmonary disease with (acute) exacerbation: Secondary | ICD-10-CM | POA: Diagnosis not present

## 2019-06-05 DIAGNOSIS — Z6825 Body mass index (BMI) 25.0-25.9, adult: Secondary | ICD-10-CM | POA: Diagnosis not present

## 2019-06-05 DIAGNOSIS — K2100 Gastro-esophageal reflux disease with esophagitis, without bleeding: Secondary | ICD-10-CM | POA: Diagnosis not present

## 2019-06-05 DIAGNOSIS — G6289 Other specified polyneuropathies: Secondary | ICD-10-CM | POA: Diagnosis not present

## 2019-06-05 DIAGNOSIS — J441 Chronic obstructive pulmonary disease with (acute) exacerbation: Secondary | ICD-10-CM | POA: Diagnosis not present

## 2019-06-12 DIAGNOSIS — J441 Chronic obstructive pulmonary disease with (acute) exacerbation: Secondary | ICD-10-CM | POA: Diagnosis not present

## 2019-06-12 DIAGNOSIS — R269 Unspecified abnormalities of gait and mobility: Secondary | ICD-10-CM | POA: Diagnosis not present

## 2019-06-12 DIAGNOSIS — I279 Pulmonary heart disease, unspecified: Secondary | ICD-10-CM | POA: Diagnosis not present

## 2019-06-13 ENCOUNTER — Encounter (HOSPITAL_COMMUNITY): Payer: Self-pay

## 2019-06-13 ENCOUNTER — Inpatient Hospital Stay (HOSPITAL_COMMUNITY)
Admission: EM | Admit: 2019-06-13 | Discharge: 2019-06-17 | DRG: 190 | Disposition: A | Discharge status: Home / Self Care | Payer: Medicare HMO | Attending: Internal Medicine | Admitting: Internal Medicine

## 2019-06-13 ENCOUNTER — Emergency Department (HOSPITAL_COMMUNITY): Payer: Medicare HMO

## 2019-06-13 ENCOUNTER — Other Ambulatory Visit: Payer: Self-pay

## 2019-06-13 DIAGNOSIS — Z7982 Long term (current) use of aspirin: Secondary | ICD-10-CM

## 2019-06-13 DIAGNOSIS — Z20822 Contact with and (suspected) exposure to covid-19: Secondary | ICD-10-CM | POA: Diagnosis not present

## 2019-06-13 DIAGNOSIS — Z79899 Other long term (current) drug therapy: Secondary | ICD-10-CM | POA: Diagnosis not present

## 2019-06-13 DIAGNOSIS — J441 Chronic obstructive pulmonary disease with (acute) exacerbation: Principal | ICD-10-CM | POA: Diagnosis present

## 2019-06-13 DIAGNOSIS — Z902 Acquired absence of lung [part of]: Secondary | ICD-10-CM

## 2019-06-13 DIAGNOSIS — M810 Age-related osteoporosis without current pathological fracture: Secondary | ICD-10-CM | POA: Diagnosis not present

## 2019-06-13 DIAGNOSIS — J383 Other diseases of vocal cords: Secondary | ICD-10-CM | POA: Diagnosis present

## 2019-06-13 DIAGNOSIS — G894 Chronic pain syndrome: Secondary | ICD-10-CM | POA: Diagnosis present

## 2019-06-13 DIAGNOSIS — Z8249 Family history of ischemic heart disease and other diseases of the circulatory system: Secondary | ICD-10-CM | POA: Diagnosis not present

## 2019-06-13 DIAGNOSIS — K5900 Constipation, unspecified: Secondary | ICD-10-CM | POA: Diagnosis not present

## 2019-06-13 DIAGNOSIS — Z86718 Personal history of other venous thrombosis and embolism: Secondary | ICD-10-CM

## 2019-06-13 DIAGNOSIS — Z79890 Hormone replacement therapy (postmenopausal): Secondary | ICD-10-CM | POA: Diagnosis not present

## 2019-06-13 DIAGNOSIS — R0602 Shortness of breath: Secondary | ICD-10-CM | POA: Diagnosis not present

## 2019-06-13 DIAGNOSIS — R404 Transient alteration of awareness: Secondary | ICD-10-CM | POA: Diagnosis not present

## 2019-06-13 DIAGNOSIS — J984 Other disorders of lung: Secondary | ICD-10-CM

## 2019-06-13 DIAGNOSIS — F17290 Nicotine dependence, other tobacco product, uncomplicated: Secondary | ICD-10-CM | POA: Diagnosis present

## 2019-06-13 DIAGNOSIS — M79606 Pain in leg, unspecified: Secondary | ICD-10-CM

## 2019-06-13 DIAGNOSIS — Z7951 Long term (current) use of inhaled steroids: Secondary | ICD-10-CM | POA: Diagnosis not present

## 2019-06-13 DIAGNOSIS — R531 Weakness: Secondary | ICD-10-CM | POA: Diagnosis present

## 2019-06-13 DIAGNOSIS — Z96641 Presence of right artificial hip joint: Secondary | ICD-10-CM | POA: Diagnosis not present

## 2019-06-13 DIAGNOSIS — R0609 Other forms of dyspnea: Secondary | ICD-10-CM

## 2019-06-13 DIAGNOSIS — F1120 Opioid dependence, uncomplicated: Secondary | ICD-10-CM | POA: Diagnosis not present

## 2019-06-13 DIAGNOSIS — H5462 Unqualified visual loss, left eye, normal vision right eye: Secondary | ICD-10-CM | POA: Diagnosis present

## 2019-06-13 DIAGNOSIS — M79661 Pain in right lower leg: Secondary | ICD-10-CM | POA: Diagnosis not present

## 2019-06-13 DIAGNOSIS — J9621 Acute and chronic respiratory failure with hypoxia: Secondary | ICD-10-CM

## 2019-06-13 DIAGNOSIS — L03311 Cellulitis of abdominal wall: Secondary | ICD-10-CM | POA: Diagnosis not present

## 2019-06-13 DIAGNOSIS — Z77090 Contact with and (suspected) exposure to asbestos: Secondary | ICD-10-CM | POA: Diagnosis not present

## 2019-06-13 DIAGNOSIS — R0600 Dyspnea, unspecified: Secondary | ICD-10-CM

## 2019-06-13 DIAGNOSIS — J439 Emphysema, unspecified: Secondary | ICD-10-CM | POA: Diagnosis not present

## 2019-06-13 DIAGNOSIS — R69 Illness, unspecified: Secondary | ICD-10-CM | POA: Diagnosis not present

## 2019-06-13 DIAGNOSIS — J9620 Acute and chronic respiratory failure, unspecified whether with hypoxia or hypercapnia: Secondary | ICD-10-CM | POA: Diagnosis present

## 2019-06-13 DIAGNOSIS — W57XXXA Bitten or stung by nonvenomous insect and other nonvenomous arthropods, initial encounter: Secondary | ICD-10-CM | POA: Diagnosis not present

## 2019-06-13 DIAGNOSIS — Z9981 Dependence on supplemental oxygen: Secondary | ICD-10-CM | POA: Diagnosis not present

## 2019-06-13 DIAGNOSIS — R600 Localized edema: Secondary | ICD-10-CM | POA: Diagnosis present

## 2019-06-13 DIAGNOSIS — Z833 Family history of diabetes mellitus: Secondary | ICD-10-CM

## 2019-06-13 DIAGNOSIS — Z888 Allergy status to other drugs, medicaments and biological substances status: Secondary | ICD-10-CM | POA: Diagnosis not present

## 2019-06-13 DIAGNOSIS — R410 Disorientation, unspecified: Secondary | ICD-10-CM | POA: Diagnosis not present

## 2019-06-13 DIAGNOSIS — M79662 Pain in left lower leg: Secondary | ICD-10-CM | POA: Diagnosis not present

## 2019-06-13 DIAGNOSIS — R0902 Hypoxemia: Secondary | ICD-10-CM

## 2019-06-13 HISTORY — DX: Contact with and (suspected) exposure to asbestos: Z77.090

## 2019-06-13 HISTORY — DX: Emphysema, unspecified: J43.9

## 2019-06-13 LAB — COMPREHENSIVE METABOLIC PANEL
ALT: 14 U/L (ref 0–44)
AST: 15 U/L (ref 15–41)
Albumin: 4.1 g/dL (ref 3.5–5.0)
Alkaline Phosphatase: 103 U/L (ref 38–126)
Anion gap: 10 (ref 5–15)
BUN: 22 mg/dL — ABNORMAL HIGH (ref 6–20)
CO2: 28 mmol/L (ref 22–32)
Calcium: 9.1 mg/dL (ref 8.9–10.3)
Chloride: 97 mmol/L — ABNORMAL LOW (ref 98–111)
Creatinine, Ser: 0.83 mg/dL (ref 0.61–1.24)
GFR calc Af Amer: >60 mL/min (ref >60)
GFR calc non Af Amer: >60 mL/min (ref >60)
Glucose, Bld: 94 mg/dL (ref 70–99)
Potassium: 3.9 mmol/L (ref 3.5–5.1)
Sodium: 135 mmol/L (ref 135–145)
Total Bilirubin: 0.3 mg/dL (ref 0.3–1.2)
Total Protein: 7.2 g/dL (ref 6.5–8.1)

## 2019-06-13 LAB — CBC WITH DIFFERENTIAL/PLATELET
Abs Immature Granulocytes: 0.15 10*3/uL — ABNORMAL HIGH (ref 0.00–0.07)
Basophils Absolute: 0.1 10*3/uL (ref 0.0–0.1)
Basophils Relative: 1 %
Eosinophils Absolute: 0.6 10*3/uL — ABNORMAL HIGH (ref 0.0–0.5)
Eosinophils Relative: 4 %
HCT: 40.8 % (ref 39.0–52.0)
Hemoglobin: 12.7 g/dL — ABNORMAL LOW (ref 13.0–17.0)
Immature Granulocytes: 1 %
Lymphocytes Relative: 16 %
Lymphs Abs: 2.5 10*3/uL (ref 0.7–4.0)
MCH: 30.7 pg (ref 26.0–34.0)
MCHC: 31.1 g/dL (ref 30.0–36.0)
MCV: 98.6 fL (ref 80.0–100.0)
Monocytes Absolute: 1.5 10*3/uL — ABNORMAL HIGH (ref 0.1–1.0)
Monocytes Relative: 9 %
Neutro Abs: 11.3 10*3/uL — ABNORMAL HIGH (ref 1.7–7.7)
Neutrophils Relative %: 69 %
Platelets: 392 10*3/uL (ref 150–400)
RBC: 4.14 MIL/uL — ABNORMAL LOW (ref 4.22–5.81)
RDW: 12.3 % (ref 11.5–15.5)
WBC: 16.1 10*3/uL — ABNORMAL HIGH (ref 4.0–10.5)
nRBC: 0 % (ref 0.0–0.2)

## 2019-06-13 LAB — D-DIMER, QUANTITATIVE: D-Dimer, Quant: 0.34 ug/mL-FEU (ref 0.00–0.50)

## 2019-06-13 LAB — TROPONIN I (HIGH SENSITIVITY)
Troponin I (High Sensitivity): 2 ng/L (ref <18)
Troponin I (High Sensitivity): <2 ng/L (ref <18)

## 2019-06-13 LAB — SARS CORONAVIRUS 2 (TAT 6-24 HRS): SARS Coronavirus 2: NEGATIVE

## 2019-06-13 LAB — BRAIN NATRIURETIC PEPTIDE: B Natriuretic Peptide: 25 pg/mL (ref 0.0–100.0)

## 2019-06-13 MED ORDER — MORPHINE SULFATE ER 30 MG PO TBCR
30.0000 mg | EXTENDED_RELEASE_TABLET | Freq: Three times a day (TID) | ORAL | Status: DC
  Administered 2019-06-13 – 2019-06-17 (×11): 30 mg via ORAL
  Filled 2019-06-13 (×2): qty 1
  Filled 2019-06-13: qty 2
  Filled 2019-06-13 (×8): qty 1

## 2019-06-13 MED ORDER — DOXYCYCLINE HYCLATE 100 MG PO TABS
100.0000 mg | ORAL_TABLET | Freq: Once | ORAL | Status: AC
  Administered 2019-06-13: 100 mg via ORAL
  Filled 2019-06-13: qty 1

## 2019-06-13 MED ORDER — GABAPENTIN 300 MG PO CAPS: 600.0000 mg | ORAL_CAPSULE | Freq: Three times a day (TID) | ORAL | Status: DC

## 2019-06-13 MED ORDER — METHYLPREDNISOLONE SODIUM SUCC 125 MG IJ SOLR
125.0000 mg | Freq: Once | INTRAMUSCULAR | Status: AC
  Administered 2019-06-13: 125 mg via INTRAVENOUS
  Filled 2019-06-13: qty 2

## 2019-06-13 MED ORDER — ONDANSETRON HCL 4 MG/2ML IJ SOLN
4.0000 mg | Freq: Once | INTRAMUSCULAR | Status: AC
  Administered 2019-06-13: 4 mg via INTRAVENOUS
  Filled 2019-06-13: qty 2

## 2019-06-13 MED ORDER — ALBUTEROL SULFATE HFA 108 (90 BASE) MCG/ACT IN AERS: 2.0000 | INHALATION_SPRAY | RESPIRATORY_TRACT | Status: DC | PRN

## 2019-06-13 MED ORDER — IPRATROPIUM-ALBUTEROL 0.5-2.5 (3) MG/3ML IN SOLN
3.0000 mL | Freq: Four times a day (QID) | RESPIRATORY_TRACT | Status: DC
  Administered 2019-06-13 – 2019-06-15 (×7): 3 mL via RESPIRATORY_TRACT
  Administered 2019-06-15: 2 mL via RESPIRATORY_TRACT
  Administered 2019-06-15 – 2019-06-17 (×6): 3 mL via RESPIRATORY_TRACT
  Filled 2019-06-13 (×13): qty 3

## 2019-06-13 MED ORDER — METHOCARBAMOL 500 MG PO TABS: 500.0000 mg | ORAL_TABLET | Freq: Four times a day (QID) | ORAL | Status: DC

## 2019-06-13 MED ORDER — ASPIRIN 81 MG PO CHEW
81.0000 mg | CHEWABLE_TABLET | Freq: Every day | ORAL | Status: DC
  Administered 2019-06-13 – 2019-06-17 (×5): 81 mg via ORAL
  Filled 2019-06-13 (×6): qty 1

## 2019-06-13 MED ORDER — OXYCODONE HCL 5 MG PO TABS
30.0000 mg | ORAL_TABLET | ORAL | Status: DC | PRN
  Administered 2019-06-13 – 2019-06-17 (×16): 30 mg via ORAL
  Filled 2019-06-13 (×16): qty 6

## 2019-06-13 MED ORDER — MORPHINE SULFATE ER 30 MG PO TBCR
30.0000 mg | EXTENDED_RELEASE_TABLET | Freq: Two times a day (BID) | ORAL | Status: DC
  Administered 2019-06-13: 30 mg via ORAL
  Filled 2019-06-13: qty 1

## 2019-06-13 MED ORDER — ACETAMINOPHEN 325 MG PO TABS
650.0000 mg | ORAL_TABLET | ORAL | Status: DC
  Administered 2019-06-14 – 2019-06-17 (×20): 650 mg via ORAL
  Filled 2019-06-13 (×21): qty 2

## 2019-06-13 MED ORDER — MAGNESIUM SULFATE 2 GM/50ML IV SOLN
2.0000 g | Freq: Once | INTRAVENOUS | Status: AC
  Administered 2019-06-13: 2 g via INTRAVENOUS
  Filled 2019-06-13: qty 50

## 2019-06-13 MED ORDER — ALBUTEROL SULFATE (2.5 MG/3ML) 0.083% IN NEBU: 2.5000 mg | INHALATION_SOLUTION | RESPIRATORY_TRACT | Status: DC | PRN

## 2019-06-13 MED ORDER — IPRATROPIUM-ALBUTEROL 20-100 MCG/ACT IN AERS
4.0000 | INHALATION_SPRAY | Freq: Four times a day (QID) | RESPIRATORY_TRACT | Status: DC
  Administered 2019-06-13 (×2): 4 via RESPIRATORY_TRACT
  Filled 2019-06-13: qty 4

## 2019-06-13 MED ORDER — IPRATROPIUM-ALBUTEROL 20-100 MCG/ACT IN AERS: 1.0000 | INHALATION_SPRAY | Freq: Four times a day (QID) | RESPIRATORY_TRACT | Status: DC

## 2019-06-13 MED ORDER — OXYCODONE HCL ER 30 MG PO T12A: 30.0000 mg | EXTENDED_RELEASE_TABLET | Freq: Three times a day (TID) | ORAL | Status: DC

## 2019-06-13 MED ORDER — ENOXAPARIN SODIUM 40 MG/0.4ML ~~LOC~~ SOLN
40.0000 mg | SUBCUTANEOUS | Status: DC
  Administered 2019-06-13 – 2019-06-16 (×4): 40 mg via SUBCUTANEOUS
  Filled 2019-06-13 (×4): qty 0.4

## 2019-06-13 MED ORDER — CEFAZOLIN SODIUM-DEXTROSE 2-4 GM/100ML-% IV SOLN
2.0000 g | Freq: Three times a day (TID) | INTRAVENOUS | Status: DC
  Administered 2019-06-13 – 2019-06-17 (×12): 2 g via INTRAVENOUS
  Filled 2019-06-13 (×12): qty 100

## 2019-06-13 MED ORDER — NICOTINE 21 MG/24HR TD PT24
21.0000 mg | MEDICATED_PATCH | Freq: Every day | TRANSDERMAL | Status: DC
  Administered 2019-06-13 – 2019-06-17 (×5): 21 mg via TRANSDERMAL
  Filled 2019-06-13 (×5): qty 1

## 2019-06-13 MED ORDER — METHYLPREDNISOLONE SODIUM SUCC 125 MG IJ SOLR
60.0000 mg | Freq: Four times a day (QID) | INTRAMUSCULAR | Status: DC
  Administered 2019-06-13 – 2019-06-17 (×16): 60 mg via INTRAVENOUS
  Filled 2019-06-13 (×17): qty 2

## 2019-06-13 MED ORDER — PREGABALIN 75 MG PO CAPS
150.0000 mg | ORAL_CAPSULE | Freq: Three times a day (TID) | ORAL | Status: DC
  Administered 2019-06-13 – 2019-06-17 (×12): 150 mg via ORAL
  Filled 2019-06-13 (×6): qty 2
  Filled 2019-06-13: qty 6
  Filled 2019-06-13 (×5): qty 2

## 2019-06-13 NOTE — H&P (Signed)
History and Physical  Wesley [name] [id number] DOB: 1965/06/18 DOA: 06/13/2019   PCP: [name], MD   Patient coming from: Home  Chief Complaint: dyspnea, cough  HPI:  Wesley [name] is a 54 year old male with a history of chronic respiratory failure on 3 L, COPD, asbestosis, chronic back pain on chronic opioids presenting with shortness of breath for at least 2 weeks.  The patient stated that he went to see his primary care provider on 06/05/2019 at which time he was placed on doxycycline and prednisone.  He was encouraged for admission at that time, but he refused.  The patient finished his last dose of doxycycline on the morning of 06/12/2019.  He continues on his prednisone taper.  Unfortunately he continued to have worsening shortness of breath.  He has had some subjective fevers and chills.  He denies any hemoptysis but complains of a cough with occasional posttussive emesis.  He denies any headache, hemoptysis, diarrhea, abdominal pain, dysuria, hematuria.  He continues to have his usual left-sided rib pain.  He denies any recent injury or trauma.  He states that whenever his breathing gets worse he notices worsening left rib pain.  He occasionally has yellow sputum with his cough.  He states that he has had to increase his oxygen to 5 L because of the shortness of breath.  He normally is on 3 L at home at rest.  He denies any purulent drainage or crepitance.  He denies any injury or trauma but he has been scratching it. The patient also complains of some lower left abdominal wall redness that started after what he felt to be a tick bite.  He states that over 24 hours it has increased in erythema. In the emergency department, the patient was afebrile hemodynamic stable.  Oxygen saturation was 95% on 5 L.  BMP, LFTs, and CBC were essentially unremarkable except for WBC 16.1.  EKG shows sinus rhythm with nonspecific T wave changes.  Troponins were unremarkable.  Chest x-ray showed no  left lower lobe scar.  The patient was admitted for further treatment of his COPD exacerbation  Assessment/Plan: Acute on chronic respiratory failure with hypoxia -Secondary COPD exacerbation -Chronically on 3 L at home at rest -Wean oxygen back to baseline as tolerated  Atypical chest pain -Likely combination of muscular skeletal strain aswell as viscerosomatic response  -Troponins negative x2 -07/18/2016 heart catheterization--normal coronaries -Personally reviewed EKG--sinus rhythm, nonspecific T wave changes  -ContninuedRobaxin  COPD exacerbation -StartPulmicort if SARS-CoV2 RT-PCR is negative -Continued duo nebs if SARS-CoV2 RT-PCR is negative -ContinuedBrovanaif SARS-CoV2 RT-PCR is negative -continuedmucomyst if SARS-CoV2 RT-PCR is negative -continueIV solumedrol--60 mg q 6hr  Abdominal Wall cellulitis -start cefazolin -finished doxy 06/12/19 and had a dose in ED  Chronic pain syndrome -Continue home dose of MS Contin -reviewed PMP AWARE--patient receives monthly MS Contin30 mg #90,oxycodone30 mg #90,and Lyrica 150 mg#67from one provider -UDS--positive opiate       Past Medical History:  Diagnosis Date  . Avascular necrosis of femur ([place])   . Back pain   . COPD (chronic obstructive pulmonary disease) ([place])   . Neuropathy   . On home oxygen therapy    "3L prn" (07/19/2016)  . Osteoporosis   . Pulmonary asbestosis ([place])   . Tumor of lung    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, 2017   Past Surgical History:  Procedure Laterality Date  . BACK SURGERY    .  EYE SURGERY     left eye cornea and lens replaced- blind in left eye  . JOINT REPLACEMENT    . LEFT HEART CATH AND CORONARY ANGIOGRAPHY N/A 07/20/2016   Procedure: Left Heart Cath and Coronary Angiography;  Surgeon: Lorretta Harp, MD;  Location: [facility];  Service: Cardiovascular;  Laterality: N/A;  . LUNG SURGERY    . TOTAL HIP ARTHROPLASTY Right 09/24/2015    Procedure: TOTAL HIP ARTHROPLASTY ANTERIOR APPROACH;  Surgeon: Gaynelle Arabian, MD;  Location: [facility];  Service: Orthopedics;  Laterality: Right;   Social History:  reports that he has been smoking e-cigarettes. He has been smoking about 1.00 pack per day. He has never used smokeless tobacco. He reports that he does not drink alcohol or use drugs.   Family History  Problem Relation Age of Onset  . Heart failure Father   . Diabetes Father   . Diabetes Mother   . Cancer Mother        male cancer     Allergies  Allergen Reactions  . Ambien [Zolpidem Tartrate] Other (See Comments)    hallucinations  . Melatonin Itching  . Zolpidem Other (See Comments)    Altered mental status     Prior to Admission medications   Medication Sig Start Date End Date Taking? Authorizing Provider  acetaminophen (TYLENOL) 325 MG tablet Take 2 tablets by mouth every 4 (four) hours.    [provider]  ADVAIR DISKUS 250-50 MCG/DOSE AEPB Inhale 1 puff into the lungs 2 (two) times daily. 06/14/15   [provider]  albuterol (PROVENTIL HFA;VENTOLIN HFA) 108 (90 Base) MCG/ACT inhaler Inhale 2 puffs into the lungs every 4 (four) hours as needed for wheezing or shortness of breath. Shortness of breath 01/11/16   Isaac Bliss, Rayford Halsted, MD  albuterol (PROVENTIL) (2.5 MG/3ML) 0.083% nebulizer solution Take 3 mLs (2.5 mg total) by nebulization every 6 (six) hours as needed for wheezing or shortness of breath. Patient taking differently: Take 2.5 mg by nebulization every 4 (four) hours as needed for wheezing or shortness of breath.  02/27/14   Kathie Dike, MD  aspirin 81 MG chewable tablet Chew 1 tablet (81 mg total) by mouth daily. 07/20/16   Cheryln Manly, NP  budesonide-formoterol (SYMBICORT) 160-4.5 MCG/ACT inhaler Inhale 3 puffs into the lungs 4 (four) times daily.    [provider]  furosemide (LASIX) 40 MG tablet Take 40 mg by mouth daily.    [provider]    gabapentin (NEURONTIN) 300 MG capsule Take 600 mg by mouth 3 (three) times daily.     [provider]  INCRUSE ELLIPTA 62.5 MCG/INH AEPB Inhale 1 puff into the lungs daily. 12/06/18   [provider]  loperamide (IMODIUM) 2 MG capsule Take 1 capsule by mouth 4 (four) times daily.    [provider]  methocarbamol (ROBAXIN) 500 MG tablet Take 1 tablet (500 mg total) by mouth 4 (four) times daily. 01/14/19   Orson Eva, MD  montelukast (SINGULAIR) 10 MG tablet Take 1 tablet by mouth daily. 07/07/16   [provider]  morphine (MS CONTIN) 30 MG 12 hr tablet Take 30 mg by mouth every 8 (eight) hours. 12/30/18   [provider]  mupirocin ointment (BACTROBAN) 2 % Place 1 application into the nose daily. 12/03/18   [provider]  naloxone HCl (NARCAN) 4 MG/0.1ML LIQD Place 4 mg into the nose as directed. To prevent overdose    [provider]  ondansetron (ZOFRAN-ODT) 4 MG disintegrating tablet Take 1 tablet by mouth every 8 (eight) hours as needed. 09/05/18   [provider]  oxyCODONE (OXYCONTIN) 30 MG 12 hr tablet Take 30 mg by mouth every 8 (eight) hours.    [provider]  Polyethyl Glycol-Propyl Glycol (SYSTANE) 0.4-0.3 % SOLN Place 1-2 drops into the left eye daily as needed (for dry eye relief).     [provider]  predniSONE (DELTASONE) 10 MG tablet Take 6 tablets (60 mg total) by mouth daily with breakfast. And decrease by one tablet daily 01/14/19   Draper Gallon, Shanon Brow, MD  pregabalin (LYRICA) 150 MG capsule Take 1 capsule by mouth 3 (three) times daily. 12/30/18   [provider]  tiotropium (SPIRIVA) 18 MCG inhalation capsule Place 1 capsule (18 mcg total) into inhaler and inhale daily. 07/24/11   Doree Albee, MD    Review of Systems:  Constitutional:  No weight loss, night sweats,  Head&Eyes: No headache.  No vision loss.  No eye pain or scotoma ENT:  No Difficulty swallowing,Tooth/dental  problems,Sore throat,  No ear ache, post nasal drip,  Cardio-vascular:  No Orthopnea, PND, swelling in lower extremities,  dizziness, palpitations  GI:  No  abdominal pain,  diarrhea, loss of appetite, hematochezia, melena, heartburn, indigestion, Resp:  No shortness of breath with exertion or at rest. No cough. No coughing up of blood .No wheezing.No chest wall deformity  Skin:  no rash or lesions.  GU:  no dysuria, change in color of urine, no urgency or frequency. No flank pain.  Musculoskeletal:  No joint pain or swelling. No decreased range of motion. No back pain.  Psych:  No change in mood or affect. No depression or anxiety. Neurologic: No headache, no dysesthesia, no focal weakness, no vision loss. No syncope  Physical Exam: Vitals:   06/13/19 0851 06/13/19 0852 06/13/19 1014  BP: 118/67    Pulse: 98    Resp: (!) 24    Temp: 98.1 F (36.7 C)    TempSrc: Oral    SpO2: 99%  99%  Weight:  82.1 kg   Height:  6\' 1"  (1.854 m)    General:  A&O x 3, NAD, nontoxic, pleasant/cooperative Head/Eye: No conjunctival hemorrhage, no icterus, [place]/AT, No nystagmus ENT:  No icterus,  No thrush, good dentition, no pharyngeal exudate Neck:  No masses, no lymphadenpathy, no bruits CV:  RRR, no rub, no gallop, no S3 Lung:  Bibasilar crackles. Bilateral exp wheeze Abdomen: soft/NT, +BS, nondistended, no peritoneal signs Ext: No cyanosis, No rashes, No petechiae, No lymphangitis, No edema Neuro: CNII-XII intact, strength 4/5 in bilateral upper and lower extremities, no dysmetria  Labs on Admission:  Basic Metabolic Panel: Recent Labs  Lab 06/13/19 0948  NA 135  K 3.9  CL 97*  CO2 28  GLUCOSE 94  BUN 22*  CREATININE 0.83  CALCIUM 9.1   Liver Function Tests: Recent Labs  Lab 06/13/19 0948  AST 15  ALT 14  ALKPHOS 103  BILITOT 0.3  PROT 7.2  ALBUMIN 4.1   No results for input(s): LIPASE, AMYLASE in the last 168 hours. No results for input(s): AMMONIA in the last 168  hours. CBC: Recent Labs  Lab 06/13/19 0948  WBC 16.1*  NEUTROABS 11.3*  HGB 12.7*  HCT 40.8  MCV 98.6  PLT 392   Coagulation Profile: No results for input(s): INR, PROTIME in the last 168 hours. Cardiac Enzymes: No results for input(s): CKTOTAL, CKMB, CKMBINDEX, TROPONINI in the  last 168 hours. BNP: Invalid input(s): POCBNP CBG: No results for input(s): GLUCAP in the last 168 hours. Urine analysis:    Component Value Date/Time   COLORURINE YELLOW 01/17/2017 0210   APPEARANCEUR CLEAR 01/17/2017 0210   LABSPEC >1.046 (H) 01/17/2017 0210   PHURINE 5.0 01/17/2017 0210   GLUCOSEU NEGATIVE 01/17/2017 0210   HGBUR NEGATIVE 01/17/2017 0210   BILIRUBINUR NEGATIVE 01/17/2017 0210   KETONESUR NEGATIVE 01/17/2017 0210   PROTEINUR NEGATIVE 01/17/2017 0210   UROBILINOGEN 0.2 05/23/2014 0102   NITRITE NEGATIVE 01/17/2017 0210   LEUKOCYTESUR NEGATIVE 01/17/2017 0210   Sepsis Labs: @LABRCNTIP (procalcitonin:4,lacticidven:4) )No results found for this or any previous visit (from the past 240 hour(s)).   Radiological Exams on Admission: DG Chest Portable 1 View  Result Date: 06/13/2019 CLINICAL DATA:  Shortness of breath. EXAM: PORTABLE CHEST 1 VIEW COMPARISON:  01/08/2019.  01/17/2017. FINDINGS: Mediastinum and hilar structures normal. Heart size normal. No focal infiltrate. COPD. Stable left base pleuroparenchymal thickening consistent with scarring. Stable elevation left hemidiaphragm. No acute bony abnormality. IMPRESSION: No focal infiltrate. COPD. Stable left base pleuroparenchymal thickening consistent with scarring. Electronically Signed   By: Marcello Moores  Register   On: 06/13/2019 09:39    EKG: Independently reviewed. Sinus, nonspecific T wave changes    Time spent:60 minutes Code Status:   FULL Family Communication:  No Family at bedside Disposition Plan: expect 2-3 day hospitalization Consults called: none DVT Prophylaxis: [place] Lovenox  Orson Eva, DO  [facility] Pager [phone]  If 7PM-7AM, please contact night-coverage [email or web address] Password [email or web address] 06/13/2019, 1:13 PM

## 2019-06-13 NOTE — Plan of Care (Signed)

## 2019-06-13 NOTE — ED Provider Notes (Signed)
[facility] EMERGENCY DEPARTMENT Provider Note   CSN: [id number] Arrival date & time: 06/13/19  [id number]     History Chief Complaint  Patient presents with  . Shortness of Breath    Wesley [name] is a 54 y.o. male.  HPI Patient with history of COPD as well as asbestosis with prior partial lobectomy at [facility] in 2012  Patient endorses shortness of breath that progressively worsened over the past 3 weeks.  He states that he has a history of similar episodes and has been hospitalized for this in the past.  He states that on Thursday of last week he saw his PCP who prescribed him doxycycline and gave him a steroid taper.  He states that he was on dicyclomine for 1 week and states that he was scheduled to have his last, single present dose today with 1 tablet.  He states he did not take his final tablet in the ED since he states that he has been progressively worsening.  He states that his shortness of breath is worse with exertion.  He states he has some left rib cage pain but denies any chest pain, pressure, tightness.  He denies any nausea or diaphoresis.  Denies any leg swelling, recent surgery, pleuritic chest pain.  Patient has any history of VTE.  No hemoptysis.  Does cough clear sputum.  Denies any fevers or chills.  No known sick contacts.  States that he has not been vaccinated for Covid.  Patient states in addition to asbestos exposure he also has smoked for many years.  He states that he quit smoking 2012 after his partial lobectomy.  He now vapes and uses nicotine patches.  Patient denies any hemoptysis, diaphoresis, lower extremity edema, orthopnea or PND.    Past Medical History:  Diagnosis Date  . Avascular necrosis of femur ([place])   . Back pain   . COPD (chronic obstructive pulmonary disease) ([place])   . Neuropathy   . On home oxygen therapy    "3L prn" (07/19/2016)  . Osteoporosis   . Pulmonary asbestosis ([place])   . Tumor of lung    tumor in left lung that I will  have surgery in August, 2017    Patient Active Problem List   Diagnosis Date Noted  . Leg pain   . SOB (shortness of breath) 01/09/2019  . Chronic respiratory failure with hypoxia ([place]) 01/09/2019  . CAP (community acquired pneumonia) 01/08/2019  . Normal coronary arteries 08/02/2016  . Atypical chest pain 07/20/2016  . Acute coronary syndrome ([place]) 07/19/2016  . Abnormal stress test 07/19/2016  . Unstable angina ([place]) 07/19/2016  . Sepsis ([place]) 03/30/2016  . HCAP (healthcare-associated pneumonia) 03/30/2016  . Hypokalemia 01/08/2016  . History of DVT (deep vein thrombosis) 01/08/2016  . OA (osteoarthritis) of hip 09/24/2015  . Community acquired pneumonia 04/14/2015  . Vomiting and diarrhea   . Solitary pulmonary nodule 05/18/2014  . Nausea and vomiting 05/16/2014  . Bronchitis, acute, with bronchospasm 02/24/2014  . Osteoporosis, unspecified 08/06/2012  . Hip pain 08/06/2012  . Opiate abuse, continuous ([place]) 01/05/2012  . Opiate dependence ([place]) 01/03/2012  . Chronic respiratory failure ([place]) 01/03/2012  . COPD exacerbation ([place]) 01/03/2012  . Pulmonary infection 01/03/2012  . Pneumonia 07/19/2011  . Fever 07/18/2011  . Myalgia 07/18/2011  . Leukocytosis 07/18/2011  . COPD (chronic obstructive pulmonary disease) ([place]) 07/18/2011  . Acute-on-chronic respiratory failure ([place]) 07/18/2011  . Tachycardia 07/18/2011  . Dehydration 07/18/2011  . Chronic pain 07/18/2011  . Tobacco  abuse 07/18/2011    Past Surgical History:  Procedure Laterality Date  . BACK SURGERY    . EYE SURGERY     left eye cornea and lens replaced- blind in left eye  . JOINT REPLACEMENT    . LEFT HEART CATH AND CORONARY ANGIOGRAPHY N/A 07/20/2016   Procedure: Left Heart Cath and Coronary Angiography;  Surgeon: Lorretta Harp, MD;  Location: [facility];  Service: Cardiovascular;  Laterality: N/A;  . LUNG SURGERY    . TOTAL HIP ARTHROPLASTY Right 09/24/2015   Procedure: TOTAL HIP  ARTHROPLASTY ANTERIOR APPROACH;  Surgeon: Gaynelle Arabian, MD;  Location: [facility];  Service: Orthopedics;  Laterality: Right;       Family History  Problem Relation Age of Onset  . Heart failure Father   . Diabetes Father   . Diabetes Mother   . Cancer Mother        male cancer    Social History   Tobacco Use  . Smoking status: Current Every Day Smoker    Packs/day: 1.00    Types: E-cigarettes    Last attempt to quit: 07/05/2011    Years since quitting: 7.9  . Smokeless tobacco: Never Used  Substance Use Topics  . Alcohol use: No  . Drug use: No    Types: Oxycodone, Morphine    Comment: takes these medications as prescribed by Physician for pain!    Home Medications Prior to Admission medications   Medication Sig Start Date End Date Taking? Authorizing Provider  acetaminophen (TYLENOL) 325 MG tablet Take 2 tablets by mouth every 4 (four) hours.    [provider]  ADVAIR DISKUS 250-50 MCG/DOSE AEPB Inhale 1 puff into the lungs 2 (two) times daily. 06/14/15   [provider]  albuterol (PROVENTIL HFA;VENTOLIN HFA) 108 (90 Base) MCG/ACT inhaler Inhale 2 puffs into the lungs every 4 (four) hours as needed for wheezing or shortness of breath. Shortness of breath 01/11/16   Isaac Bliss, Rayford Halsted, MD  albuterol (PROVENTIL) (2.5 MG/3ML) 0.083% nebulizer solution Take 3 mLs (2.5 mg total) by nebulization every 6 (six) hours as needed for wheezing or shortness of breath. Patient taking differently: Take 2.5 mg by nebulization every 4 (four) hours as needed for wheezing or shortness of breath.  02/27/14   Kathie Dike, MD  aspirin 81 MG chewable tablet Chew 1 tablet (81 mg total) by mouth daily. 07/20/16   Cheryln Manly, NP  budesonide-formoterol (SYMBICORT) 160-4.5 MCG/ACT inhaler Inhale 3 puffs into the lungs 4 (four) times daily.    [provider]  furosemide (LASIX) 40 MG tablet Take 40 mg by mouth daily.    [provider]  gabapentin  (NEURONTIN) 300 MG capsule Take 600 mg by mouth 3 (three) times daily.     [provider]  INCRUSE ELLIPTA 62.5 MCG/INH AEPB Inhale 1 puff into the lungs daily. 12/06/18   [provider]  loperamide (IMODIUM) 2 MG capsule Take 1 capsule by mouth 4 (four) times daily.    [provider]  methocarbamol (ROBAXIN) 500 MG tablet Take 1 tablet (500 mg total) by mouth 4 (four) times daily. 01/14/19   Orson Eva, MD  montelukast (SINGULAIR) 10 MG tablet Take 1 tablet by mouth daily. 07/07/16   [provider]  morphine (MS CONTIN) 30 MG 12 hr tablet Take 30 mg by mouth every 8 (eight) hours. 12/30/18   [provider]  mupirocin ointment (BACTROBAN) 2 % Place 1 application into the nose  daily. 12/03/18   [provider]  naloxone HCl (NARCAN) 4 MG/0.1ML LIQD Place 4 mg into the nose as directed. To prevent overdose    [provider]  ondansetron (ZOFRAN-ODT) 4 MG disintegrating tablet Take 1 tablet by mouth every 8 (eight) hours as needed. 09/05/18   [provider]  oxyCODONE (OXYCONTIN) 30 MG 12 hr tablet Take 30 mg by mouth every 8 (eight) hours.    [provider]  Polyethyl Glycol-Propyl Glycol (SYSTANE) 0.4-0.3 % SOLN Place 1-2 drops into the left eye daily as needed (for dry eye relief).     [provider]  predniSONE (DELTASONE) 10 MG tablet Take 6 tablets (60 mg total) by mouth daily with breakfast. And decrease by one tablet daily 01/14/19   Tat, Shanon Brow, MD  pregabalin (LYRICA) 150 MG capsule Take 1 capsule by mouth 3 (three) times daily. 12/30/18   [provider]  tiotropium (SPIRIVA) 18 MCG inhalation capsule Place 1 capsule (18 mcg total) into inhaler and inhale daily. 07/24/11   Doree Albee, MD    Allergies    Ambien [zolpidem tartrate], Melatonin, and Zolpidem  Review of Systems   Review of Systems  Constitutional: Negative for chills and fever.  HENT: Negative for congestion.   Eyes:  Negative for pain.  Respiratory: Positive for cough and shortness of breath.   Cardiovascular: Negative for chest pain and leg swelling.  Gastrointestinal: Negative for abdominal pain and vomiting.  Genitourinary: Negative for dysuria.  Musculoskeletal: Negative for myalgias.       Rib pain  Skin: Positive for rash.  Neurological: Negative for dizziness and headaches.    Physical Exam Updated Vital Signs BP 118/67 (BP Location: Left Arm)   Pulse 98   Temp 98.1 F (36.7 C) (Oral)   Resp (!) 24   Ht 6\' 1"  (1.854 m)   Wt 82.1 kg   SpO2 99%   BMI 23.88 kg/m   Physical Exam Vitals and nursing note reviewed.  Constitutional:      Comments: Chronically ill-appearing 54 year old gentleman appears much older than stated age, pleasant, able answer questions properly follow commands.  Speaking full sentences however does have increased work of breathing.  HENT:     Head: Normocephalic and atraumatic.     Nose: Nose normal.     Mouth/Throat:     Mouth: Mucous membranes are moist.  Eyes:     General: No scleral icterus. Cardiovascular:     Rate and Rhythm: Normal rate and regular rhythm.     Pulses: Normal pulses.     Heart sounds: Normal heart sounds.  Pulmonary:     Effort: Pulmonary effort is normal. No respiratory distress.     Breath sounds: Wheezing present.     Comments: Expiratory wheezes throughout.  Mildly increased work of breathing.  Mild tachypnea at 24.  Left lateral rib cage tenderness to palpation. Abdominal:     Palpations: Abdomen is soft.     Tenderness: There is no abdominal tenderness.  Musculoskeletal:     Cervical back: Normal range of motion.     Right lower leg: No edema.     Left lower leg: No edema.  Skin:    General: Skin is warm and dry.     Capillary Refill: Capillary refill takes less than 2 seconds.     Comments: Warm, red area approximately 8 cm x 7 cm to the left lower abdomen consistent with cellulitis  Neurological:     Mental Status: He  is alert. Mental status is at baseline.  Psychiatric:        Mood and Affect: Mood normal.        Behavior: Behavior normal.     ED Results / Procedures / Treatments   Labs (all labs ordered are listed, but only abnormal results are displayed) Labs Reviewed  CBC WITH DIFFERENTIAL/PLATELET - Abnormal; Notable for the following components:      Result Value   WBC 16.1 (*)    RBC 4.14 (*)    Hemoglobin 12.7 (*)    Neutro Abs 11.3 (*)    Monocytes Absolute 1.5 (*)    Eosinophils Absolute 0.6 (*)    Abs Immature Granulocytes 0.15 (*)    All other components within normal limits  COMPREHENSIVE METABOLIC PANEL - Abnormal; Notable for the following components:   Chloride 97 (*)    BUN 22 (*)    All other components within normal limits  SARS CORONAVIRUS 2 (TAT 6-24 HRS)  BRAIN NATRIURETIC PEPTIDE  ROCKY MTN SPOTTED FVR ABS PNL(IGG+IGM)  B. BURGDORFI ANTIBODIES  TROPONIN I (HIGH SENSITIVITY)  TROPONIN I (HIGH SENSITIVITY)    EKG None  Radiology DG Chest Portable 1 View  Result Date: 06/13/2019 CLINICAL DATA:  Shortness of breath. EXAM: PORTABLE CHEST 1 VIEW COMPARISON:  01/08/2019.  01/17/2017. FINDINGS: Mediastinum and hilar structures normal. Heart size normal. No focal infiltrate. COPD. Stable left base pleuroparenchymal thickening consistent with scarring. Stable elevation left hemidiaphragm. No acute bony abnormality. IMPRESSION: No focal infiltrate. COPD. Stable left base pleuroparenchymal thickening consistent with scarring. Electronically Signed   By: Marcello Moores  Register   On: 06/13/2019 09:39    Procedures .Critical Care Performed by: Tedd Sias, PA Authorized by: Tedd Sias, PA   Critical care provider statement:    Critical care time (minutes):  35   Critical care time was exclusive of:  Separately billable procedures and treating other patients and teaching time   Critical care was necessary to treat or prevent imminent or life-threatening deterioration of  the following conditions:  Respiratory failure   Critical care was time spent personally by me on the following activities:  Discussions with consultants, evaluation of patient's response to treatment, examination of patient, review of old charts, re-evaluation of patient's condition, pulse oximetry, ordering and review of radiographic studies, ordering and review of laboratory studies and ordering and performing treatments and interventions   I assumed direction of critical care for this patient from another provider in my specialty: no     (including critical care time)  Medications Ordered in ED Medications  Ipratropium-Albuterol (COMBIVENT) respimat 4 puff (4 puffs Inhalation Given 06/13/19 1014)  morphine (MS CONTIN) 12 hr tablet 30 mg (30 mg Oral Given 06/13/19 1129)  methylPREDNISolone sodium succinate (SOLU-MEDROL) 125 mg/2 mL injection 125 mg (125 mg Intravenous Given 06/13/19 1128)  magnesium sulfate IVPB 2 g 50 mL (2 g Intravenous New Bag/Given 06/13/19 1130)  doxycycline (VIBRA-TABS) tablet 100 mg (100 mg Oral Given 06/13/19 1128)  ondansetron (ZOFRAN) injection 4 mg (4 mg Intravenous Given 06/13/19 1125)    ED Course  I have reviewed the triage vital signs and the nursing notes.  Pertinent labs & imaging results that were available during my care of the patient were reviewed by me and considered in my medical decision making (see chart for details).    MDM Rules/Calculators/A&P                      54 year old male  with history of asbestosis partial lobectomy at [facility], heavy smoker previously currently not a smoker but does use vape.  Patient states 3 weeks of progressively worsening dyspnea on exertion, shortness of breath, was recently placed on steroid taper and antibiotics by PCP however he failed this and presents to ED today with worsening symptoms.  Physical exam notable for expiratory wheezing and tachypnea.  Patient treated with Solu-Medrol, mag, Combivent inhaler,  doxycycline.  He will be ambulated with pulse ox.  I discussed this case with my attending physician who cosigned this note including patient's presenting symptoms, physical exam, and planned diagnostics and interventions. Attending physician stated agreement with plan or made changes to plan which were implemented.   Attending physician assessed patient at bedside.  DEMARQUISE WEATHERLEY was evaluated in Emergency Department on 06/13/2019 for the symptoms described in the history of present illness. He was evaluated in the context of the global COVID-19 pandemic, which necessitated consideration that the patient might be at risk for infection with the SARS-CoV-2 virus that causes COVID-19. Institutional protocols and algorithms that pertain to the evaluation of patients at risk for COVID-19 are in a state of rapid change based on information released by regulatory bodies including the CDC and federal and state organizations. These policies and algorithms were followed during the patient's care in the ED.   Patient continues to have wheezing on exam coarse lung sounds as acutely dyspneic when ambulated.  Pulse ox stayed normal on home oxygen via nasal cannula however patient was too symptomatic to continue ambulatory trial.  Discussed my attending physician.  Plan to admit at this time.  Consulted hospitalist for admission.  Dr. Roderic Palau discussed with hospitalist who will the patient hospital.  Patient is been treated with doxycycline as antibiotic for CAP coverage and 4-day point illness coverage.  As well as cellulitis.  Final Clinical Impression(s) / ED Diagnoses Final diagnoses:  Dyspnea on exertion  Chronic lung disease  Hypoxia  Cellulitis of abdominal wall  Tick bite, initial encounter    Rx / DC Orders ED Discharge Orders    None       Tedd Sias, [place] 06/13/19 1300    Milton Ferguson, MD 06/13/19 1351

## 2019-06-13 NOTE — ED Notes (Signed)
Pt ambulated a few yards down the hall. Pt had to take two breaks due to being SOB. Pt's O2 sats stayed around 96 and 99% on 4L.

## 2019-06-13 NOTE — ED Triage Notes (Signed)
Pt reports that he was seen by PCP last Thursday and was placed on prednisone and doxycycline but conts to be SOB. Pt needed to be admitted but pt didn't want to be admitted at the time due to issues with wife and didn't want to go to [facility] Pt has been using nebs every 4 hours. Pt wheezing. Also pulled a tick off his stomach last night and now had a hard red area on left lower abdomen

## 2019-06-14 ENCOUNTER — Inpatient Hospital Stay (HOSPITAL_COMMUNITY): Payer: Medicare HMO

## 2019-06-14 DIAGNOSIS — J441 Chronic obstructive pulmonary disease with (acute) exacerbation: Secondary | ICD-10-CM | POA: Diagnosis not present

## 2019-06-14 DIAGNOSIS — F1120 Opioid dependence, uncomplicated: Secondary | ICD-10-CM | POA: Diagnosis not present

## 2019-06-14 DIAGNOSIS — L03311 Cellulitis of abdominal wall: Secondary | ICD-10-CM | POA: Diagnosis not present

## 2019-06-14 DIAGNOSIS — J9621 Acute and chronic respiratory failure with hypoxia: Secondary | ICD-10-CM | POA: Diagnosis not present

## 2019-06-14 LAB — CBC
HCT: 43.6 % (ref 39.0–52.0)
Hemoglobin: 12.9 g/dL — ABNORMAL LOW (ref 13.0–17.0)
MCH: 30.2 pg (ref 26.0–34.0)
MCHC: 29.6 g/dL — ABNORMAL LOW (ref 30.0–36.0)
MCV: 102.1 fL — ABNORMAL HIGH (ref 80.0–100.0)
Platelets: 413 10*3/uL — ABNORMAL HIGH (ref 150–400)
RBC: 4.27 MIL/uL (ref 4.22–5.81)
RDW: 12.3 % (ref 11.5–15.5)
WBC: 14.9 10*3/uL — ABNORMAL HIGH (ref 4.0–10.5)
nRBC: 0 % (ref 0.0–0.2)

## 2019-06-14 LAB — GLUCOSE, CAPILLARY
Glucose-Capillary: 151 mg/dL — ABNORMAL HIGH (ref 70–99)
Glucose-Capillary: 156 mg/dL — ABNORMAL HIGH (ref 70–99)

## 2019-06-14 LAB — URINALYSIS, COMPLETE (UACMP) WITH MICROSCOPIC
Bacteria, UA: NONE SEEN
Bilirubin Urine: NEGATIVE
Glucose, UA: 50 mg/dL — AB
Hgb urine dipstick: NEGATIVE
Ketones, ur: NEGATIVE mg/dL
Leukocytes,Ua: NEGATIVE
Nitrite: NEGATIVE
Protein, ur: NEGATIVE mg/dL
Specific Gravity, Urine: 1.009 (ref 1.005–1.030)
pH: 6 (ref 5.0–8.0)

## 2019-06-14 LAB — RAPID URINE DRUG SCREEN, HOSP PERFORMED
Amphetamines: NOT DETECTED
Barbiturates: NOT DETECTED
Benzodiazepines: NOT DETECTED
Cocaine: NOT DETECTED
Opiates: POSITIVE — AB
Tetrahydrocannabinol: NOT DETECTED

## 2019-06-14 LAB — BASIC METABOLIC PANEL
Anion gap: 11 (ref 5–15)
BUN: 22 mg/dL — ABNORMAL HIGH (ref 6–20)
CO2: 32 mmol/L (ref 22–32)
Calcium: 9.3 mg/dL (ref 8.9–10.3)
Chloride: 94 mmol/L — ABNORMAL LOW (ref 98–111)
Creatinine, Ser: 0.85 mg/dL (ref 0.61–1.24)
GFR calc Af Amer: >60 mL/min (ref >60)
GFR calc non Af Amer: >60 mL/min (ref >60)
Glucose, Bld: 189 mg/dL — ABNORMAL HIGH (ref 70–99)
Potassium: 4.7 mmol/L (ref 3.5–5.1)
Sodium: 137 mmol/L (ref 135–145)

## 2019-06-14 LAB — MAGNESIUM: Magnesium: 2.3 mg/dL (ref 1.7–2.4)

## 2019-06-14 MED ORDER — INSULIN ASPART 100 UNIT/ML ~~LOC~~ SOLN: 0.0000 [IU] | Freq: Every day | SUBCUTANEOUS | Status: DC

## 2019-06-14 MED ORDER — POLYETHYLENE GLYCOL 3350 17 G PO PACK
17.0000 g | PACK | Freq: Every day | ORAL | Status: DC
  Administered 2019-06-14 – 2019-06-16 (×3): 17 g via ORAL
  Filled 2019-06-14 (×3): qty 1

## 2019-06-14 MED ORDER — ARFORMOTEROL TARTRATE 15 MCG/2ML IN NEBU
15.0000 ug | INHALATION_SOLUTION | Freq: Two times a day (BID) | RESPIRATORY_TRACT | Status: DC
  Administered 2019-06-14 – 2019-06-15 (×2): 15 ug via RESPIRATORY_TRACT
  Administered 2019-06-15: 7.5 ug via RESPIRATORY_TRACT
  Administered 2019-06-16 (×2): 15 ug via RESPIRATORY_TRACT
  Filled 2019-06-14 (×5): qty 2

## 2019-06-14 MED ORDER — ALBUTEROL SULFATE (2.5 MG/3ML) 0.083% IN NEBU
2.5000 mg | INHALATION_SOLUTION | RESPIRATORY_TRACT | Status: DC | PRN
  Administered 2019-06-14: 2.5 mg via RESPIRATORY_TRACT
  Filled 2019-06-14 (×2): qty 3

## 2019-06-14 MED ORDER — SENNA 8.6 MG PO TABS
2.0000 | ORAL_TABLET | Freq: Every day | ORAL | Status: DC
  Administered 2019-06-14 – 2019-06-17 (×4): 17.2 mg via ORAL
  Filled 2019-06-14 (×4): qty 2

## 2019-06-14 MED ORDER — BUDESONIDE 0.5 MG/2ML IN SUSP
0.5000 mg | Freq: Two times a day (BID) | RESPIRATORY_TRACT | Status: DC
  Administered 2019-06-14 – 2019-06-17 (×6): 0.5 mg via RESPIRATORY_TRACT
  Filled 2019-06-14 (×6): qty 2

## 2019-06-14 MED ORDER — ONDANSETRON HCL 4 MG/2ML IJ SOLN: 4.0000 mg | Freq: Four times a day (QID) | INTRAMUSCULAR | Status: DC | PRN

## 2019-06-14 MED ORDER — INSULIN ASPART 100 UNIT/ML ~~LOC~~ SOLN
0.0000 [IU] | Freq: Three times a day (TID) | SUBCUTANEOUS | Status: DC
  Administered 2019-06-14 – 2019-06-15 (×2): 2 [IU] via SUBCUTANEOUS
  Administered 2019-06-15: 1 [IU] via SUBCUTANEOUS
  Administered 2019-06-15 – 2019-06-16 (×3): 2 [IU] via SUBCUTANEOUS
  Administered 2019-06-17: 3 [IU] via SUBCUTANEOUS
  Administered 2019-06-17: 2 [IU] via SUBCUTANEOUS

## 2019-06-14 MED ORDER — ACETAMINOPHEN 325 MG PO TABS: 650.0000 mg | ORAL_TABLET | Freq: Four times a day (QID) | ORAL | Status: DC | PRN

## 2019-06-14 NOTE — Progress Notes (Signed)
PROGRESS NOTE  ICARUS BRANT [id number] DOB: 1966/01/03 DOA: 06/13/2019 PCP: [name], MD  Brief History:  54 year old male with a history of chronic respiratory failure on 3 L, COPD, asbestosis, chronic back pain on chronic opioids presenting with shortness of breath for at least 2 weeks.  The patient stated that he went to see his primary care provider on 06/05/2019 at which time he was placed on doxycycline and prednisone.  He was encouraged for admission at that time, but he refused.  The patient finished his last dose of doxycycline on the morning of 06/12/2019.  He continues on his prednisone taper.  Unfortunately he continued to have worsening shortness of breath.  He has had some subjective fevers and chills.  He denies any hemoptysis but complains of a cough with occasional posttussive emesis.  He continues to have his usual left-sided rib pain.  He denies any recent injury or trauma.  He states that whenever his breathing gets worse he notices worsening left rib pain.  He occasionally has yellow sputum with his cough.  He states that he has had to increase his oxygen to 5 L because of the shortness of breath.  He normally is on 3 L at home at rest.  The patient also complains of some lower left abdominal wall redness that started after what he felt to be a tick bite.  He states that over 24 hours it has increased in erythema. In the emergency department, the patient was afebrile hemodynamic stable.  Oxygen saturation was 95% on 5 L.  BMP, LFTs, and CBC were essentially unremarkable except for WBC 16.1.  EKG shows sinus rhythm with nonspecific T wave changes.  Troponins were unremarkable.  Chest x-ray showed no left lower lobe scar.  The patient was admitted for further treatment of his COPD exacerbation  Assessment/Plan: Acute on chronic respiratory failure with hypoxia -Secondary COPD exacerbation -Chronically on 3 L at home at rest -Wean oxygen back to baseline as  tolerated -SARS-CoV2 RT-PCR is negative  Atypical chest pain -Likely combination of muscular skeletal strain aswell as viscerosomatic response  -Troponins negative x2 -07/18/2016 heart catheterization--normal coronaries -Personally reviewed EKG--sinus rhythm, nonspecific T wave changes  -ContninuedRobaxin  COPD exacerbation -StartPulmicort -Continuedduo nebs -ContinuedBrovana -continuedmucomyst  -continueIV solumedrol--60 mg q 6hr  Abdominal Wall cellulitis -continue cefazolin -finished doxy 06/12/19 and had a dose in ED  Chronic pain syndrome -Continue home dose of MS Contin -reviewed PMP AWARE--patient receives monthly MS Contin30 mg #90,oxycodone30 mg #90,and Lyrica 150 mg#15from one provider -UDS--  Constipation -start miralax and senna  Intermittent right hand weakness -present for 2 months      Disposition Plan: Patient From: Home D/C Place: Home - 2-3  Days Barriers: Not Clinically Stable--remains dyspneic with wheezing requiring IV steroids  Family Communication:   Family at bedside  Consultants:  none  Code Status:  FULL  DVT Prophylaxis:   Lovenox   Procedures: As Listed in Progress Note Above  Antibiotics: None       Subjective: He complains of dyspneic with minimal exertion.  Denies cp, sob, n/v/d.  Complains of constipation.  Complains of pain all over.  No headache  Objective: Vitals:   06/14/19 0144 06/14/19 0400 06/14/19 0728 06/14/19 1338  BP:  110/76    Pulse:  90    Resp:  17    Temp:  98.7 F (37.1 C)    TempSrc:  Oral    SpO2:  96% 94% 94% 94%  Weight:      Height:        Intake/Output Summary (Last 24 hours) at 06/14/2019 1512 Last data filed at 06/14/2019 0900 Gross per 24 hour  Intake 1333.13 ml  Output --  Net 1333.13 ml   Weight change:  Exam:   General:  Pt is alert, follows commands appropriately, not in acute distress  HEENT: No icterus, No thrush, No neck mass,  [place]/AT  Cardiovascular: RRR, S1/S2, no rubs, no gallops  Respiratory: bilateral rales.  Bilateral exp wheeze  Abdomen: Soft/+BS, non tender, non distended, no guarding  Extremities: No edema, No lymphangitis, No petechiae, No rashes, no synovitis   Data Reviewed: I have personally reviewed following labs and imaging studies Basic Metabolic Panel: Recent Labs  Lab 06/13/19 0948 06/14/19 0714  NA 135 137  K 3.9 4.7  CL 97* 94*  CO2 28 32  GLUCOSE 94 189*  BUN 22* 22*  CREATININE 0.83 0.85  CALCIUM 9.1 9.3  MG  --  2.3   Liver Function Tests: Recent Labs  Lab 06/13/19 0948  AST 15  ALT 14  ALKPHOS 103  BILITOT 0.3  PROT 7.2  ALBUMIN 4.1   No results for input(s): LIPASE, AMYLASE in the last 168 hours. No results for input(s): AMMONIA in the last 168 hours. Coagulation Profile: No results for input(s): INR, PROTIME in the last 168 hours. CBC: Recent Labs  Lab 06/13/19 0948 06/14/19 0714  WBC 16.1* 14.9*  NEUTROABS 11.3*  --   HGB 12.7* 12.9*  HCT 40.8 43.6  MCV 98.6 102.1*  PLT 392 413*   Cardiac Enzymes: No results for input(s): CKTOTAL, CKMB, CKMBINDEX, TROPONINI in the last 168 hours. BNP: Invalid input(s): POCBNP CBG: No results for input(s): GLUCAP in the last 168 hours. HbA1C: No results for input(s): HGBA1C in the last 72 hours. Urine analysis:    Component Value Date/Time   COLORURINE YELLOW 01/17/2017 0210   APPEARANCEUR CLEAR 01/17/2017 0210   LABSPEC >1.046 (H) 01/17/2017 0210   PHURINE 5.0 01/17/2017 0210   GLUCOSEU NEGATIVE 01/17/2017 0210   HGBUR NEGATIVE 01/17/2017 0210   BILIRUBINUR NEGATIVE 01/17/2017 0210   KETONESUR NEGATIVE 01/17/2017 0210   PROTEINUR NEGATIVE 01/17/2017 0210   UROBILINOGEN 0.2 05/23/2014 0102   NITRITE NEGATIVE 01/17/2017 0210   LEUKOCYTESUR NEGATIVE 01/17/2017 0210   Sepsis Labs: @LABRCNTIP (procalcitonin:4,lacticidven:4) ) Recent Results (from the past 240 hour(s))  SARS CORONAVIRUS 2 (Sharyn Brilliant 6-24 HRS)  Nasopharyngeal Nasopharyngeal Swab     Status: None   Collection Time: 06/13/19  9:40 AM   Specimen: Nasopharyngeal Swab  Result Value Ref Range Status   SARS Coronavirus 2 NEGATIVE NEGATIVE Final    Comment: (NOTE) SARS-CoV-2 target nucleic acids are NOT DETECTED. The SARS-CoV-2 RNA is generally detectable in upper and lower respiratory specimens during the acute phase of infection. Negative results do not preclude SARS-CoV-2 infection, do not rule out co-infections with other pathogens, and should not be used as the sole basis for treatment or other patient management decisions. Negative results must be combined with clinical observations, patient history, and epidemiological information. The expected result is Negative. Fact Sheet for Patients: SugarRoll.be Fact Sheet for Healthcare Providers: https://www.woods-mathews.com/ This test is not yet approved or cleared by the [place] FDA and  has been authorized for detection and/or diagnosis of SARS-CoV-2 by FDA under an Emergency Use Authorization (EUA). This EUA will remain  in effect (meaning this test can be used) for the duration of the COVID-19  declaration under Section 56 4(b)(1) of the Act, 21 U.S.C. section 360bbb-3(b)(1), unless the authorization is terminated or revoked sooner. Performed at [facility], [place] [address] [place], [place] [address]      Scheduled Meds: . acetaminophen  650 mg Oral Q4H  . arformoterol  15 mcg Nebulization BID  . aspirin  81 mg Oral Daily  . budesonide (PULMICORT) nebulizer solution  0.5 mg Nebulization BID  . enoxaparin (LOVENOX) injection  40 mg Subcutaneous Q24H  . ipratropium-albuterol  3 mL Nebulization Q6H  . methylPREDNISolone (SOLU-MEDROL) injection  60 mg Intravenous Q6H  . morphine  30 mg Oral Q8H  . nicotine  21 mg Transdermal Daily  . polyethylene glycol  17 g Oral Daily  . pregabalin  150 mg Oral TID  . senna  2 tablet  Oral Daily   Continuous Infusions: .  ceFAZolin (ANCEF) IV 2 g (06/14/19 1424)    Procedures/Studies: DG Chest Portable 1 View  Result Date: 06/13/2019 CLINICAL DATA:  Shortness of breath. EXAM: PORTABLE CHEST 1 VIEW COMPARISON:  01/08/2019.  01/17/2017. FINDINGS: Mediastinum and hilar structures normal. Heart size normal. No focal infiltrate. COPD. Stable left base pleuroparenchymal thickening consistent with scarring. Stable elevation left hemidiaphragm. No acute bony abnormality. IMPRESSION: No focal infiltrate. COPD. Stable left base pleuroparenchymal thickening consistent with scarring. Electronically Signed   By: Marcello Moores  Register   On: 06/13/2019 09:39    Orson Eva, DO  [facility]  If 7PM-7AM, please contact night-coverage [email or web address] Password [email or web address] 06/14/2019, 3:12 PM   LOS: 1 day

## 2019-06-14 NOTE — Progress Notes (Signed)
Patient has asked several times about the schedule of his HHN to be increased to Q4h. Patient was made aware that his treatments are Q6 and did not need to be changed at this time.Patient has not been short of breath at anytime other than when patient attempts to walk up the hall to nurses desk. Patient BBS are expiratory wheezes (which has been his normal on past visits). Patient has been asleep slumped over each time RT has gone into room to give [facility] and he has not been in any distress. RT has assessed patient and he has Q4h PRN HHN available if needed. Patient continues to be on 5 LPM [place] and RT will attempt to wean back to his baseline of 3 LPM [place] which he wears at home.

## 2019-06-15 ENCOUNTER — Inpatient Hospital Stay (HOSPITAL_COMMUNITY): Payer: Medicare HMO

## 2019-06-15 DIAGNOSIS — M79606 Pain in leg, unspecified: Secondary | ICD-10-CM | POA: Diagnosis not present

## 2019-06-15 DIAGNOSIS — J441 Chronic obstructive pulmonary disease with (acute) exacerbation: Secondary | ICD-10-CM | POA: Diagnosis not present

## 2019-06-15 DIAGNOSIS — J9621 Acute and chronic respiratory failure with hypoxia: Secondary | ICD-10-CM | POA: Diagnosis not present

## 2019-06-15 DIAGNOSIS — L03311 Cellulitis of abdominal wall: Secondary | ICD-10-CM | POA: Diagnosis not present

## 2019-06-15 LAB — BASIC METABOLIC PANEL
Anion gap: 10 (ref 5–15)
BUN: 15 mg/dL (ref 6–20)
CO2: 34 mmol/L — ABNORMAL HIGH (ref 22–32)
Calcium: 9.1 mg/dL (ref 8.9–10.3)
Chloride: 92 mmol/L — ABNORMAL LOW (ref 98–111)
Creatinine, Ser: 0.82 mg/dL (ref 0.61–1.24)
GFR calc Af Amer: >60 mL/min (ref >60)
GFR calc non Af Amer: >60 mL/min (ref >60)
Glucose, Bld: 160 mg/dL — ABNORMAL HIGH (ref 70–99)
Potassium: 4.2 mmol/L (ref 3.5–5.1)
Sodium: 136 mmol/L (ref 135–145)

## 2019-06-15 LAB — GLUCOSE, CAPILLARY
Glucose-Capillary: 121 mg/dL — ABNORMAL HIGH (ref 70–99)
Glucose-Capillary: 155 mg/dL — ABNORMAL HIGH (ref 70–99)
Glucose-Capillary: 166 mg/dL — ABNORMAL HIGH (ref 70–99)
Glucose-Capillary: 143 mg/dL — ABNORMAL HIGH (ref 70–99)

## 2019-06-15 LAB — TSH: TSH: 0.108 u[IU]/mL — ABNORMAL LOW (ref 0.350–4.500)

## 2019-06-15 LAB — VITAMIN B12: Vitamin B-12: 170 pg/mL — ABNORMAL LOW (ref 180–914)

## 2019-06-15 LAB — PHOSPHORUS: Phosphorus: 3.2 mg/dL (ref 2.5–4.6)

## 2019-06-15 LAB — FOLATE: Folate: 3.4 ng/mL — ABNORMAL LOW (ref >5.9)

## 2019-06-15 LAB — MAGNESIUM: Magnesium: 2.3 mg/dL (ref 1.7–2.4)

## 2019-06-15 MED ORDER — ACETYLCYSTEINE 20 % IN SOLN
RESPIRATORY_TRACT | Status: AC
  Administered 2019-06-15: 2 mL via RESPIRATORY_TRACT
  Filled 2019-06-15: qty 4

## 2019-06-15 MED ORDER — MILK AND MOLASSES ENEMA: 1.0000 | Freq: Once | RECTAL | Status: DC

## 2019-06-15 MED ORDER — ACETYLCYSTEINE 20 % IN SOLN
2.0000 mL | Freq: Two times a day (BID) | RESPIRATORY_TRACT | Status: DC
  Administered 2019-06-15 – 2019-06-16 (×3): 2 mL via RESPIRATORY_TRACT
  Filled 2019-06-15 (×3): qty 4

## 2019-06-15 NOTE — Progress Notes (Signed)
Pt complains of "not emptying bladder after urination." Bladder scan complete after void, 90ml. Suggested pt may be constipated with continued use of narcotics. Pt agreed. Stool softners already ordered

## 2019-06-15 NOTE — Progress Notes (Signed)
Pt walked to threshold of door and eyes were closed leaning against door. Directed pt back to bed. Educated pt that this was unsafe behavior and he could fall and hurt himself. Pt refuses bed alarm  Will hold MS contin 30 mg due to oversedation/drowsiness.

## 2019-06-15 NOTE — Plan of Care (Signed)

## 2019-06-15 NOTE — Progress Notes (Signed)
Pt ambulated in hallway to nurses station with oxygen tank. Once to nurses station pt stated "It's an emergency. I can't breath.Call respiratory." Pt was short of breath after walking from room. Audible expiratory wheezing noted. Educated pt that he should not be ambulating without assistance.  Pt returns to room with assistance. Begins to slump over on knees sitting on side of bed with eyes closed while nurse in room. Will continue to monitor and educate as needed.

## 2019-06-15 NOTE — Progress Notes (Signed)
PROGRESS NOTE  Wesley [name] [id number] DOB: 1965/09/08 DOA: 06/13/2019 PCP: [name], MD  Brief History:  54 year old male with a history of chronic respiratory failure on 3 L, COPD, asbestosis, chronic back pain on chronic opioids presenting withshortness of breath for at least 2 weeks. The patient stated that he went to see his primary care provider on 06/05/2019 at which time he was placed on doxycycline and prednisone. He was encouraged for admission at that time, but he refused. The patient finished his last dose of doxycycline on the morning of 06/12/2019. He continues on his prednisone taper. Unfortunately he continued to have worsening shortness of breath. He has had some subjective fevers and chills. He denies any hemoptysis but complains of a cough with occasional posttussive emesis.  He continues to have his usual left-sided rib pain. He denies any recent injury or trauma. He states that whenever his breathing gets worse he notices worsening left rib pain. He occasionally has yellow sputum with his cough. He states that he has had to increase his oxygen to 5 L because of the shortness of breath. He normally is on 3 L at home at rest.  The patient also complains of some lower left abdominal wall redness that started after what he felt to be a tick bite. He states that over 24 hours it has increased in erythema. In the emergency department, the patient was afebrile hemodynamic stable.Oxygen saturation was 95% on 5 L. BMP, LFTs, and CBC were essentially unremarkable except for WBC 16.1. EKG shows sinus rhythm with nonspecific T wave changes. Troponins were unremarkable. Chest x-ray showed no left lower lobe scar. The patient was admitted for further treatment of his COPD exacerbation  Assessment/Plan: Acute on chronic respiratory failure with hypoxia -Secondary COPD exacerbation -Chronically on 3 L at home at rest -now stable on 5L -Wean oxygen back  to baseline as tolerated -SARS-CoV2 RT-PCR is negative  Atypical chest pain -Likely combination of muscular skeletal strain aswell as viscerosomatic response  -Troponins negative x2 -07/18/2016 heart catheterization--normal coronaries -Personally reviewed EKG--sinus rhythm, nonspecific T wave changes  -ContninuedRobaxin  COPD exacerbation/Upper airway cough syndrome -ContinuePulmicort -Continuedduo nebs -ContinuedBrovana -continuedmucomyst -continueIV solumedrol--60 mg q 6hr -pt likely has component of vocal cord dysfunction-->wheeze -CT chest  Abdominal Wall cellulitis -continue cefazolin -finished doxy 06/12/19 and had a dose in ED  Chronic pain syndrome -Continue home dose of MS Contin -reviewed PMP AWARE--patient receives monthly MS Contin30 mg #90,oxycodone30 mg #90,and Lyrica 150 mg#57from one provider -UDS--positive opiates  Constipation -start miralax and senna--noBM -give milk and molasses enema  Intermittent bilateral hand weakness -present for 2 months -occasionally accompanied by confusion -CT brain neg -EEG  Leg edema -venous duplex -echo -urine protein/creatinine ratio    Subjective: Patient denies fevers, chills, headache, chest pain,  nausea, vomiting, diarrhea, abdominal pain, dysuria, hematuria, hematochezia, and melena.  Complain that he cannot get out sputum despite vigorous cough and constipation.   Objective: Vitals:   06/14/19 2017 06/15/19 0350 06/15/19 0354 06/15/19 0804  BP: 134/78 137/73    Pulse: (!) 106 (!) 109    Resp: 16 16    Temp: 98.5 F (36.9 C) 99.3 F (37.4 C)    TempSrc: Oral Oral    SpO2: 93% 96% 96% 98%  Weight:      Height:        Intake/Output Summary (Last 24 hours) at 06/15/2019 1147 Last data filed at 06/14/2019 1303 Gross per  24 hour  Intake 240 ml  Output --  Net 240 ml   Weight change:  Exam:   General:  Pt is alert, follows commands appropriately, not in acute distress  HEENT:  No icterus, No thrush, No neck mass, [place]/AT  Cardiovascular: RRR, S1/S2, no rubs, no gallops  Respiratory: bilateral wheeze.  Bilateral rales  Abdomen: Soft/+BS, non tender, non distended, no guarding  Extremities: 1+ LE edema, No lymphangitis, No petechiae, No rashes, no synovitis   Data Reviewed: I have personally reviewed following labs and imaging studies Basic Metabolic Panel: Recent Labs  Lab 06/13/19 0948 06/14/19 0714 06/15/19 0639  NA 135 137 136  K 3.9 4.7 4.2  CL 97* 94* 92*  CO2 28 32 34*  GLUCOSE 94 189* 160*  BUN 22* 22* 15  CREATININE 0.83 0.85 0.82  CALCIUM 9.1 9.3 9.1  MG  --  2.3 2.3  PHOS  --   --  3.2   Liver Function Tests: Recent Labs  Lab 06/13/19 0948  AST 15  ALT 14  ALKPHOS 103  BILITOT 0.3  PROT 7.2  ALBUMIN 4.1   No results for input(s): LIPASE, AMYLASE in the last 168 hours. No results for input(s): AMMONIA in the last 168 hours. Coagulation Profile: No results for input(s): INR, PROTIME in the last 168 hours. CBC: Recent Labs  Lab 06/13/19 0948 06/14/19 0714  WBC 16.1* 14.9*  NEUTROABS 11.3*  --   HGB 12.7* 12.9*  HCT 40.8 43.6  MCV 98.6 102.1*  PLT 392 413*   Cardiac Enzymes: No results for input(s): CKTOTAL, CKMB, CKMBINDEX, TROPONINI in the last 168 hours. BNP: Invalid input(s): POCBNP CBG: Recent Labs  Lab 06/14/19 1652 06/14/19 2019 06/15/19 0752 06/15/19 1133  GLUCAP 151* 156* 121* 155*   HbA1C: No results for input(s): HGBA1C in the last 72 hours. Urine analysis:    Component Value Date/Time   COLORURINE STRAW (A) 06/14/2019 1508   APPEARANCEUR CLEAR 06/14/2019 1508   LABSPEC 1.009 06/14/2019 1508   PHURINE 6.0 06/14/2019 1508   GLUCOSEU 50 (A) 06/14/2019 1508   HGBUR NEGATIVE 06/14/2019 1508   BILIRUBINUR NEGATIVE 06/14/2019 1508   KETONESUR NEGATIVE 06/14/2019 1508   PROTEINUR NEGATIVE 06/14/2019 1508   UROBILINOGEN 0.2 05/23/2014 0102   NITRITE NEGATIVE 06/14/2019 1508   LEUKOCYTESUR NEGATIVE  06/14/2019 1508   Sepsis Labs: @LABRCNTIP (procalcitonin:4,lacticidven:4) ) Recent Results (from the past 240 hour(s))  SARS CORONAVIRUS 2 ([place]ose Shark 6-24 HRS) Nasopharyngeal Nasopharyngeal Swab     Status: None   Collection Time: 06/13/19  9:40 AM   Specimen: Nasopharyngeal Swab  Result Value Ref Range Status   SARS Coronavirus 2 NEGATIVE NEGATIVE Final    Comment: (NOTE) SARS-CoV-2 target nucleic acids are NOT DETECTED. The SARS-CoV-2 RNA is generally detectable in upper and lower respiratory specimens during the acute phase of infection. Negative results do not preclude SARS-CoV-2 infection, do not rule out co-infections with other pathogens, and should not be used as the sole basis for treatment or other patient management decisions. Negative results must be combined with clinical observations, patient history, and epidemiological information. The expected result is Negative. Fact Sheet for Patients: SugarRoll.be Fact Sheet for Healthcare Providers: https://www.woods-mathews.com/ This test is not yet approved or cleared by the [place] FDA and  has been authorized for detection and/or diagnosis of SARS-CoV-2 by FDA under an Emergency Use Authorization (EUA). This EUA will remain  in effect (meaning this test can be used) for the duration of the COVID-19 declaration under Section 56  4(b)(1) of the Act, 21 U.S.C. section 360bbb-3(b)(1), unless the authorization is terminated or revoked sooner. Performed at [facility], [place] [address], [place], [place] [address]      Scheduled Meds: . acetaminophen  650 mg Oral Q4H  . acetylcysteine  4 mL Nebulization BID  . arformoterol  15 mcg Nebulization BID  . aspirin  81 mg Oral Daily  . budesonide (PULMICORT) nebulizer solution  0.5 mg Nebulization BID  . enoxaparin (LOVENOX) injection  40 mg Subcutaneous Q24H  . insulin aspart  0-5 Units Subcutaneous QHS  . insulin aspart  0-9 Units  Subcutaneous TID WC  . ipratropium-albuterol  3 mL Nebulization Q6H  . methylPREDNISolone (SOLU-MEDROL) injection  60 mg Intravenous Q6H  . milk and molasses  1 enema Rectal Once  . morphine  30 mg Oral Q8H  . nicotine  21 mg Transdermal Daily  . polyethylene glycol  17 g Oral Daily  . pregabalin  150 mg Oral TID  . senna  2 tablet Oral Daily   Continuous Infusions: .  ceFAZolin (ANCEF) IV 2 g (06/15/19 0606)    Procedures/Studies: CT HEAD WO CONTRAST  Result Date: 06/14/2019 CLINICAL DATA:  54 year old male with bilateral upper extremity weakness, confusion, recent tick bite. EXAM: CT HEAD WITHOUT CONTRAST TECHNIQUE: Contiguous axial images were obtained from the base of the skull through the vertex without intravenous contrast. COMPARISON:  Head CT 04/02/2016. Brain MRI 04/03/2016. FINDINGS: Brain: Cerebral volume remains normal. No midline shift, ventriculomegaly, mass effect, evidence of mass lesion, intracranial hemorrhage or evidence of cortically based acute infarction. Gray-white matter differentiation is within normal limits throughout the brain. Vascular: No suspicious intracranial vascular hyperdensity. Skull: Negative. Sinuses/Orbits: Visualized paranasal sinuses and mastoids are clear. Other: Stable and negative orbit and scalp soft tissues. IMPRESSION: Stable and normal noncontrast CT appearance of the brain. Electronically Signed   By: Genevie Ann M.D.   On: 06/14/2019 17:28   DG Chest Portable 1 View  Result Date: 06/13/2019 CLINICAL DATA:  Shortness of breath. EXAM: PORTABLE CHEST 1 VIEW COMPARISON:  01/08/2019.  01/17/2017. FINDINGS: Mediastinum and hilar structures normal. Heart size normal. No focal infiltrate. COPD. Stable left base pleuroparenchymal thickening consistent with scarring. Stable elevation left hemidiaphragm. No acute bony abnormality. IMPRESSION: No focal infiltrate. COPD. Stable left base pleuroparenchymal thickening consistent with scarring. Electronically Signed    By: Marcello Moores  Register   On: 06/13/2019 09:39    Orson Eva, DO  [facility]  If 7PM-7AM, please contact night-coverage [email or web address] Password [facility] 06/15/2019, 11:47 AM   LOS: 2 days

## 2019-06-16 ENCOUNTER — Inpatient Hospital Stay (HOSPITAL_COMMUNITY)
Admit: 2019-06-16 | Discharge: 2019-06-16 | Disposition: A | Discharge status: Home / Self Care | Payer: Medicare HMO | Attending: Internal Medicine | Admitting: Internal Medicine

## 2019-06-16 ENCOUNTER — Encounter (HOSPITAL_COMMUNITY): Payer: Self-pay | Admitting: Internal Medicine

## 2019-06-16 ENCOUNTER — Inpatient Hospital Stay (HOSPITAL_COMMUNITY): Payer: Medicare HMO

## 2019-06-16 DIAGNOSIS — R404 Transient alteration of awareness: Secondary | ICD-10-CM

## 2019-06-16 DIAGNOSIS — J9621 Acute and chronic respiratory failure with hypoxia: Secondary | ICD-10-CM

## 2019-06-16 DIAGNOSIS — R0600 Dyspnea, unspecified: Secondary | ICD-10-CM

## 2019-06-16 DIAGNOSIS — J441 Chronic obstructive pulmonary disease with (acute) exacerbation: Principal | ICD-10-CM

## 2019-06-16 DIAGNOSIS — J439 Emphysema, unspecified: Secondary | ICD-10-CM

## 2019-06-16 DIAGNOSIS — J984 Other disorders of lung: Secondary | ICD-10-CM

## 2019-06-16 LAB — COMPREHENSIVE METABOLIC PANEL
ALT: 16 U/L (ref 0–44)
AST: 15 U/L (ref 15–41)
Albumin: 3.6 g/dL (ref 3.5–5.0)
Alkaline Phosphatase: 91 U/L (ref 38–126)
Anion gap: 10 (ref 5–15)
BUN: 17 mg/dL (ref 6–20)
CO2: 36 mmol/L — ABNORMAL HIGH (ref 22–32)
Calcium: 9.2 mg/dL (ref 8.9–10.3)
Chloride: 94 mmol/L — ABNORMAL LOW (ref 98–111)
Creatinine, Ser: 0.74 mg/dL (ref 0.61–1.24)
GFR calc Af Amer: >60 mL/min (ref >60)
GFR calc non Af Amer: >60 mL/min (ref >60)
Glucose, Bld: 159 mg/dL — ABNORMAL HIGH (ref 70–99)
Potassium: 4.2 mmol/L (ref 3.5–5.1)
Sodium: 140 mmol/L (ref 135–145)
Total Bilirubin: 0.1 mg/dL — ABNORMAL LOW (ref 0.3–1.2)
Total Protein: 6.3 g/dL — ABNORMAL LOW (ref 6.5–8.1)

## 2019-06-16 LAB — MAGNESIUM: Magnesium: 2.5 mg/dL — ABNORMAL HIGH (ref 1.7–2.4)

## 2019-06-16 LAB — PROTEIN / CREATININE RATIO, URINE
Creatinine, Urine: 83.25 mg/dL
Protein Creatinine Ratio: 0.13 mg/mg{Cre} (ref 0.00–0.15)
Total Protein, Urine: 11 mg/dL

## 2019-06-16 LAB — URINE CULTURE: Culture: NO GROWTH

## 2019-06-16 LAB — RAPID URINE DRUG SCREEN, HOSP PERFORMED
Amphetamines: NOT DETECTED
Barbiturates: NOT DETECTED
Benzodiazepines: NOT DETECTED
Cocaine: NOT DETECTED
Opiates: POSITIVE — AB
Tetrahydrocannabinol: NOT DETECTED

## 2019-06-16 LAB — ECHOCARDIOGRAM COMPLETE
Height: 73 in
Weight: 2896 oz

## 2019-06-16 LAB — CBC
HCT: 41.3 % (ref 39.0–52.0)
Hemoglobin: 12.5 g/dL — ABNORMAL LOW (ref 13.0–17.0)
MCH: 30.5 pg (ref 26.0–34.0)
MCHC: 30.3 g/dL (ref 30.0–36.0)
MCV: 100.7 fL — ABNORMAL HIGH (ref 80.0–100.0)
Platelets: 371 10*3/uL (ref 150–400)
RBC: 4.1 MIL/uL — ABNORMAL LOW (ref 4.22–5.81)
RDW: 12.1 % (ref 11.5–15.5)
WBC: 19.1 10*3/uL — ABNORMAL HIGH (ref 4.0–10.5)
nRBC: 0.1 % (ref 0.0–0.2)

## 2019-06-16 LAB — HEMOGLOBIN A1C
Hgb A1c MFr Bld: 5.5 % (ref 4.8–5.6)
Hgb A1c MFr Bld: 5.6 % (ref 4.8–5.6)
Mean Plasma Glucose: 111 mg/dL
Mean Plasma Glucose: 114 mg/dL

## 2019-06-16 LAB — GLUCOSE, CAPILLARY
Glucose-Capillary: 173 mg/dL — ABNORMAL HIGH (ref 70–99)
Glucose-Capillary: 164 mg/dL — ABNORMAL HIGH (ref 70–99)
Glucose-Capillary: 126 mg/dL — ABNORMAL HIGH (ref 70–99)
Glucose-Capillary: 183 mg/dL — ABNORMAL HIGH (ref 70–99)
Glucose-Capillary: 192 mg/dL — ABNORMAL HIGH (ref 70–99)
Glucose-Capillary: 367 mg/dL — ABNORMAL HIGH (ref 70–99)

## 2019-06-16 LAB — B. BURGDORFI ANTIBODIES: B burgdorferi Ab IgG+IgM: <0.91 {ISR} (ref 0.00–0.90)

## 2019-06-16 LAB — T4, FREE: Free T4: 0.73 ng/dL (ref 0.61–1.12)

## 2019-06-16 MED ORDER — POLYETHYLENE GLYCOL 3350 17 G PO PACK
17.0000 g | PACK | Freq: Two times a day (BID) | ORAL | Status: DC
  Administered 2019-06-16 – 2019-06-17 (×2): 17 g via ORAL
  Filled 2019-06-16 (×2): qty 1

## 2019-06-16 MED ORDER — BISACODYL 10 MG RE SUPP
10.0000 mg | Freq: Once | RECTAL | Status: AC
  Administered 2019-06-16: 10 mg via RECTAL
  Filled 2019-06-16: qty 1

## 2019-06-16 NOTE — Procedures (Signed)
Patient Name: Wesley [name]  MRN: [id number]  Epilepsy Attending: Lora Havens  Referring Physician/Provider: Dr. Shanon Brow Tat Date: 06/16/2019 Duration: 23.23 mins  Patient history: 54 year old male admitted for shortness of breath.  Also noted to have intermittent bilateral hand weakness occasionally accompanied by confusion.  EEG to evaluate for seizures.  Level of alertness: Awake  AEDs during EEG study: Pregabalin  Technical aspects: This EEG study was done with scalp electrodes positioned according to the 10-20 International system of electrode placement. Electrical activity was acquired at a sampling rate of 500Hz  and reviewed with a high frequency filter of 70Hz  and a low frequency filter of 1Hz . EEG data were recorded continuously and digitally stored.   Description: No clear posterior dominant rhythm was seen. EEG showed continuous generalized polymorphic 3-6hz  theta-delta slowing. Hyperventilation and photic stimulation were not performed.  ABNORMALITY - Continuous slow, generalized  IMPRESSION: This study is suggestive of moderate diffuse encephalopathy, non specific to etiology. No seizures or epileptiform discharges were seen throughout the recording.  [name] Barbra Sarks

## 2019-06-16 NOTE — Progress Notes (Signed)
*  PRELIMINARY RESULTS* Echocardiogram 2D Echocardiogram has been performed.  Wesley [name] 06/16/2019, 10:47 AM

## 2019-06-16 NOTE — Plan of Care (Signed)
  Problem: Education: Goal: Knowledge of General Education information will improve Description Including pain rating scale, medication(s)/side effects and non-pharmacologic comfort measures Outcome: Progressing   Problem: Nutrition: Goal: Adequate nutrition will be maintained Outcome: Progressing   Problem: Pain Managment: Goal: General experience of comfort will improve Outcome: Progressing   

## 2019-06-16 NOTE — Consult Note (Signed)
NAME:  Wesley [name], MRN:  [id number], DOB:  11/20/1965, LOS: 3 ADMISSION DATE:  06/13/2019, CONSULTATION DATE:  06/16/2019 REFERRING MD:  Dr. Carles Collet, [facility], CHIEF COMPLAINT:  Short of breath   Brief History   54 yo male smoker presented progressive dyspnea from AECOPD exacerbation after failing outpt therapy.  History of present illness   54 yo male with hx of COPD from bullous emphysema presented with progressive dyspnea.  Also had cough and wheeze with sputum that was brown/black.  Was seen by PCP and tx with doxycycline and prednisone, but symptoms progressed.    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  He was in hospital in 2013 with respiratory failure and told he wasn't going to survive.  His daughter read about a robotic lung surgery procedure at [facility].  He had bullectomy in 2014 and 2015.  He felt like he got new lungs.  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  His breathing got worse again.  He has been using 3 to 5 liters oxygen prior to this hospitalization.  He still has cough with sputum and wheeze, but breathing improved some since admission.  Past Medical History  Osteoporosis, Neuropathy, COPD, Back pain, s/p Rt VATS bullectomy 01/16/13, s/p Lt VATS bullectomy 08/28/13, Asbestos exposure, Lt lung hernia, Chronic pain  Consults:    Procedures:    Significant Diagnostic Tests:   A1AT 01/14/19 >> 165  PFT 11/20/12 >> FEV1 0.86 (21%), FEV1% 30, TLC 10.41 (145%), DLCO 42%  CT chest 06/15/19 >> severe emphysema with bullous changes at apices, prior wedge resection from Rt and Lt upper lobes, lung hernia on Lt  Echo 06/16/19 >> EF 65 to 70%  Doppler legs 06/16/19 >> no DVT  Micro Data:  SARS CoV2 TAT 4/09 >> negative  Antimicrobials:  Doxycycline 4/09 Ancef 4/09 >>   Interim history/subjective:    Objective   Blood pressure (!) 141/91, pulse 94, temperature  97.8 F (36.6 C), resp. rate 17, height 6\' 1"  (1.854 m), weight 82.1 kg, SpO2 91 %.        Intake/Output Summary (Last 24 hours) at 06/16/2019 1518 Last data filed at 06/16/2019 1300 Gross per 24 hour  Intake 720 ml  Output --  Net 720 ml   Filed Weights   06/13/19 0852  Weight: 82.1 kg    Examination:  General - alert Eyes - pupils reactive ENT - no sinus tenderness, no stridor Cardiac - regular rate/rhythm, no murmur Chest - decreased BS, prolonged exhalation, no wheezing, lung herniation on Lt with coughing Abdomen - soft, non tender, + bowel sounds Extremities - no cyanosis, clubbing, or edema Skin - no rashes Neuro - normal strength, moves extremities, follows commands Psych - normal mood and behavior Lymphatics - no lymphadenopathy   Resolved Hospital Problem list     Assessment & Plan:   Acute on chronic hypoxic respiratory failure from AECOPD. - goal SpO2 88 to 95% - continue solumedrol - continue brovana, pulmicort, duoneb - continue mucomyst for now  Severe bullous emphysema s/p b/l bullectomy. Lt lung herniation. - previously followed by Dr. Erie Noe with thoracic surgery at [facility]  Hx of asbestos exposure. - CT chest imaging doesn't current show evidence for asbestos related lung disease  Abdominal wall cellulitis. Chronic pain syndrome. Constipation. - per primary team  Best practice:  Diet: Regular  DVT prophylaxis: Lovenox GI prophylaxis:  Not indicated Mobility: OOB to chair Code Status: Full code  Labs:  CBC: Recent Labs  Lab 06/13/19 0948 06/14/19 0714 06/16/19 0515  WBC 16.1* 14.9* 19.1*  NEUTROABS 11.3*  --   --   HGB 12.7* 12.9* 12.5*  HCT 40.8 43.6 41.3  MCV 98.6 102.1* 100.7*  PLT 392 413* [id number]    Basic Metabolic Panel: Recent Labs  Lab 06/13/19 0948 06/14/19 0714 06/15/19 0639 06/16/19 0515  NA 135 137 136 140  K 3.9 4.7 4.2 4.2  CL 97* 94* 92* 94*  CO2 28 32 34* 36*  GLUCOSE 94 189* 160* 159*  BUN 22* 22* 15 17   CREATININE 0.83 0.85 0.82 0.74  CALCIUM 9.1 9.3 9.1 9.2  MG  --  2.3 2.3 2.5*  PHOS  --   --  3.2  --    GFR: Estimated Creatinine Clearance: 116.5 mL/min (by C-G formula based on SCr of 0.74 mg/dL). Recent Labs  Lab 06/13/19 0948 06/14/19 0714 06/16/19 0515  WBC 16.1* 14.9* 19.1*    Liver Function Tests: Recent Labs  Lab 06/13/19 0948 06/16/19 0515  AST 15 15  ALT 14 16  ALKPHOS 103 91  BILITOT 0.3 0.1*  PROT 7.2 6.3*  ALBUMIN 4.1 3.6   No results for input(s): LIPASE, AMYLASE in the last 168 hours. No results for input(s): AMMONIA in the last 168 hours.  ABG    Component Value Date/Time   PHART 7.409 02/24/2014 1558   PCO2ART 39.5 02/24/2014 1558   PO2ART 79.9 (L) 02/24/2014 1558   HCO3 26.5 01/09/2019 0118   TCO2 21.5 02/24/2014 1558   O2SAT 51.4 01/09/2019 0118     Coagulation Profile: No results for input(s): INR, PROTIME in the last 168 hours.  Cardiac Enzymes: No results for input(s): CKTOTAL, CKMB, CKMBINDEX, TROPONINI in the last 168 hours.  HbA1C: Hgb A1c MFr Bld  Date/Time Value Ref Range Status  06/15/2019 06:39 AM 5.6 4.8 - 5.6 % Final    Comment:    (NOTE)         Prediabetes: 5.7 - 6.4         Diabetes: >6.4         Glycemic control for adults with diabetes: <7.0   06/14/2019 07:14 AM 5.5 4.8 - 5.6 % Final    Comment:    (NOTE)         Prediabetes: 5.7 - 6.4         Diabetes: >6.4         Glycemic control for adults with diabetes: <7.0     CBG: Recent Labs  Lab 06/15/19 1133 06/15/19 1632 06/15/19 2056 06/16/19 0725 06/16/19 1111  GLUCAP 155* 166* 143* 173* 164*    Review of Systems:   Reviewed and negative  Past Medical History  He,  has a past medical history of Asbestos exposure, Avascular necrosis of femur ([place]), Back pain, Bullous emphysema ([facility]), COPD (chronic obstructive pulmonary disease) ([place]), Neuropathy, On home oxygen therapy, Osteoporosis, and Tumor of lung.   Surgical History    Past Surgical History:   Procedure Laterality Date  . BACK SURGERY    . EYE SURGERY     left eye cornea and lens replaced- blind in left eye  . JOINT REPLACEMENT    . LEFT HEART CATH AND CORONARY ANGIOGRAPHY N/A 07/20/2016   Procedure: Left Heart Cath and Coronary Angiography;  Surgeon: Lorretta Harp, MD;  Location: [facility];  Service: Cardiovascular;  Laterality: N/A;  .  LUNG SURGERY    . TOTAL HIP ARTHROPLASTY Right 09/24/2015   Procedure: TOTAL HIP ARTHROPLASTY ANTERIOR APPROACH;  Surgeon: Gaynelle Arabian, MD;  Location: [facility];  Service: Orthopedics;  Laterality: Right;     Social History   reports that he has been smoking e-cigarettes. He has been smoking about 1.00 pack per day. He has never used smokeless tobacco. He reports that he does not drink alcohol or use drugs.   Family History   His family history includes Cancer in his mother; Diabetes in his father and mother; Heart failure in his father.   Allergies Allergies  Allergen Reactions  . Ambien [Zolpidem Tartrate] Other (See Comments)    hallucinations  . Melatonin Itching  . Zolpidem Other (See Comments)    Altered mental status     Home Medications  Prior to Admission medications   Medication Sig Start Date End Date Taking? Authorizing Provider  acetaminophen (TYLENOL) 325 MG tablet Take 2 tablets by mouth every 4 (four) hours.   Yes [provider]  ADVAIR DISKUS 250-50 MCG/DOSE AEPB Inhale 1 puff into the lungs 2 (two) times daily. 06/14/15  Yes [provider]  albuterol (PROVENTIL HFA;VENTOLIN HFA) 108 (90 Base) MCG/ACT inhaler Inhale 2 puffs into the lungs every 4 (four) hours as needed for wheezing or shortness of breath. Shortness of breath 01/11/16  Yes Isaac Bliss, Rayford Halsted, MD  albuterol (PROVENTIL) (2.5 MG/3ML) 0.083% nebulizer solution Take 3 mLs (2.5 mg total) by nebulization every 6 (six) hours as needed for wheezing or shortness of breath. Patient taking differently: Take 2.5 mg by nebulization  every 4 (four) hours as needed for wheezing or shortness of breath.  02/27/14  Yes Kathie Dike, MD  aspirin 81 MG chewable tablet Chew 1 tablet (81 mg total) by mouth daily. 07/20/16  Yes Cheryln Manly, NP  budesonide-formoterol (SYMBICORT) 160-4.5 MCG/ACT inhaler Inhale 3 puffs into the lungs 4 (four) times daily.   Yes [provider]  doxycycline (VIBRA-TABS) 100 MG tablet Take 100 mg by mouth 2 (two) times daily. 06/05/19  Yes [provider]  furosemide (LASIX) 40 MG tablet Take 40 mg by mouth daily.   Yes [provider]  INCRUSE ELLIPTA 62.5 MCG/INH AEPB Inhale 1 puff into the lungs daily. 12/06/18  Yes [provider]  loperamide (IMODIUM) 2 MG capsule Take 1 capsule by mouth 4 (four) times daily.   Yes [provider]  montelukast (SINGULAIR) 10 MG tablet Take 1 tablet by mouth daily. 07/07/16  Yes [provider]  morphine (MS CONTIN) 30 MG 12 hr tablet Take 30 mg by mouth every 8 (eight) hours. 12/30/18  Yes [provider]  mupirocin ointment (BACTROBAN) 2 % Place 1 application into the nose daily. 12/03/18  Yes [provider]  naloxone HCl (NARCAN) 4 MG/0.1ML LIQD Place 4 mg into the nose as directed. To prevent overdose   Yes [provider]  ondansetron (ZOFRAN-ODT) 4 MG disintegrating tablet Take 1 tablet by mouth every 8 (eight) hours as needed. 09/05/18  Yes [provider]  oxycodone (ROXICODONE) 30 MG immediate release tablet Take 30 mg by mouth every 4 (four) hours as needed for pain. 06/12/19  Yes [provider]  Polyethyl Glycol-Propyl Glycol (SYSTANE) 0.4-0.3 % SOLN Place 1-2 drops into the left eye daily as needed (for dry eye relief).    Yes [provider]  pregabalin (LYRICA) 150 MG capsule Take 1 capsule by mouth 3 (three) times daily. 12/30/18  Yes [provider]  tiotropium (SPIRIVA) 18 MCG inhalation capsule Place 1 capsule (18 mcg total) into inhaler  and inhale daily. 07/24/11  Yes Doree Albee, MD     Signature:  Chesley Mires, MD [place] Pager - [phone] 06/16/2019, 3:18 PM

## 2019-06-16 NOTE — Care Management Important Message (Signed)
Important Message  Patient Details  Name: Wesley [name] MRN: [id number] Date of Birth: June 02, 1965   Medicare Important Message Given:  Yes     Tommy Medal 06/16/2019, 4:16 PM

## 2019-06-16 NOTE — Progress Notes (Signed)
EEG complete - results pending 

## 2019-06-17 DIAGNOSIS — F1120 Opioid dependence, uncomplicated: Secondary | ICD-10-CM | POA: Diagnosis not present

## 2019-06-17 DIAGNOSIS — L03311 Cellulitis of abdominal wall: Secondary | ICD-10-CM | POA: Diagnosis not present

## 2019-06-17 LAB — GLUCOSE, CAPILLARY
Glucose-Capillary: 159 mg/dL — ABNORMAL HIGH (ref 70–99)
Glucose-Capillary: 205 mg/dL — ABNORMAL HIGH (ref 70–99)

## 2019-06-17 LAB — ROCKY MTN SPOTTED FVR ABS PNL(IGG+IGM)
RMSF IgG: NEGATIVE
RMSF IgM: 0.37 index (ref 0.00–0.89)

## 2019-06-17 MED ORDER — CEPHALEXIN 500 MG PO CAPS: 500.0000 mg | ORAL_CAPSULE | Freq: Four times a day (QID) | ORAL | Status: DC

## 2019-06-17 MED ORDER — PREDNISONE 20 MG PO TABS: 60.0000 mg | ORAL_TABLET | Freq: Every day | ORAL | Status: DC

## 2019-06-17 MED ORDER — CEPHALEXIN 500 MG PO CAPS: 500.0000 mg | ORAL_CAPSULE | Freq: Four times a day (QID) | ORAL | 0 refills | Status: DC

## 2019-06-17 MED ORDER — PREDNISONE 10 MG PO TABS: 60.0000 mg | ORAL_TABLET | Freq: Every day | ORAL | 0 refills | Status: DC

## 2019-06-17 NOTE — Progress Notes (Signed)
NAME:  Wesley [name], MRN:  [id number], DOB:  [birth year], LOS: 4 ADMISSION DATE:  06/13/2019, CONSULTATION DATE:  06/16/2019 REFERRING MD:  Dr. Carles Collet, [facility], CHIEF COMPLAINT:  Short of breath   Brief History   54 yo male smoker presented progressive dyspnea from AECOPD exacerbation after failing outpt therapy.  History of present illness   54 yo male with hx of COPD from bullous emphysema presented with progressive dyspnea.  Also had cough and wheeze with sputum that was brown/black.  Was seen by PCP and tx with doxycycline and prednisone, but symptoms progressed.    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  He was in hospital in 2013 with respiratory failure and told he wasn't going to survive.  His daughter read about a robotic lung surgery procedure at [facility].  He had bullectomy in 2014 and 2015.  He felt like he got new lungs.  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  His breathing got worse again.  He has been using 3 to 5 liters oxygen prior to this hospitalization.  He still has cough with sputum and wheeze, but breathing improved some since admission.  Past Medical History  Osteoporosis, Neuropathy, COPD, Back pain, s/p Rt VATS bullectomy 01/16/13, s/p Lt VATS bullectomy 08/28/13, Asbestos exposure, Lt lung hernia, Chronic pain  Consults:    Procedures:    Significant Diagnostic Tests:   A1AT 01/14/19 >> 165  PFT 11/20/12 >> FEV1 0.86 (21%), FEV1% 30, TLC 10.41 (145%), DLCO 42%  CT chest 06/15/19 >> severe emphysema with bullous changes at apices, prior wedge resection from Rt and Lt upper lobes, lung hernia on Lt  Echo 06/16/19 >> EF 65 to 70%  Doppler legs 06/16/19 >> no DVT  EEG 06/16/19 >> continuous slowing  Micro Data:  SARS CoV2 TAT 4/09 >> negative  Antimicrobials:  Doxycycline 4/09 Ancef 4/09 >>   Interim history/subjective:  Still has cough and wheeze,  but feels like he is slowly improving.  Objective   Blood pressure (!) 152/85, pulse 100, temperature 98.7 F (37.1 C), temperature source Oral, resp. rate 20, height 6\' 1"  (1.854 m), weight 82.1 kg, SpO2 97 %.        Intake/Output Summary (Last 24 hours) at 06/17/2019 1359 Last data filed at 06/17/2019 0100 Gross per 24 hour  Intake 920 ml  Output 300 ml  Net 620 ml   Filed Weights   06/13/19 0852  Weight: 82.1 kg    Examination:  General - alert Eyes - pupils reactive ENT - no sinus tenderness, no stridor Cardiac - regular rate/rhythm, no murmur Chest - prolonged exhalation, faint wheeze b/l, Lt chest wall defect with lung hernia with coughing Abdomen - soft, non tender, + bowel sounds Extremities - 1+ edema Skin - no rashes Neuro - normal strength, moves extremities, follows commands Psych - normal mood and behavior   Resolved Hospital Problem list     Assessment & Plan:   Acute on chronic hypoxic respiratory failure from AECOPD. - goal SpO2 88 to 95% - continue solumedrol - continue BDs and mucomyst  Severe bullous emphysema s/p b/l bullectomy. Lt lung herniation. - previously followed by Dr. Erie Noe with thoracic surgery at [facility]  Hx of asbestos exposure. - CT chest imaging doesn't current show evidence for asbestos related lung disease  Syncope.  Abdominal wall cellulitis. Chronic pain syndrome. Constipation. - per primary  team  Best practice:  Diet: Regular  DVT prophylaxis: Lovenox GI prophylaxis: Not indicated Mobility: OOB to chair Code Status: Full code  Labs:   CMP Latest Ref Rng & Units 06/16/2019 06/15/2019 06/14/2019  Glucose 70 - 99 mg/dL 159(H) 160(H) 189(H)  BUN 6 - 20 mg/dL 17 15 22(H)  Creatinine 0.61 - 1.24 mg/dL 0.74 0.82 0.85  Sodium 135 - 145 mmol/L 140 136 137  Potassium 3.5 - 5.1 mmol/L 4.2 4.2 4.7  Chloride 98 - 111 mmol/L 94(L) 92(L) 94(L)  CO2 22 - 32 mmol/L 36(H) 34(H) 32  Calcium 8.9 - 10.3 mg/dL 9.2 9.1 9.3  Total  Protein 6.5 - 8.1 g/dL 6.3(L) - -  Total Bilirubin 0.3 - 1.2 mg/dL 0.1(L) - -  Alkaline Phos 38 - 126 U/L 91 - -  AST 15 - 41 U/L 15 - -  ALT 0 - 44 U/L 16 - -    CBC Latest Ref Rng & Units 06/16/2019 06/14/2019 06/13/2019  WBC 4.0 - 10.5 K/uL 19.1(H) 14.9(H) 16.1(H)  Hemoglobin 13.0 - 17.0 g/dL 12.5(L) 12.9(L) 12.7(L)  Hematocrit 39.0 - 52.0 % 41.3 43.6 40.8  Platelets 150 - 400 K/uL 371 413(H) 392    ABG    Component Value Date/Time   PHART 7.409 02/24/2014 1558   PCO2ART 39.5 02/24/2014 1558   PO2ART 79.9 (L) 02/24/2014 1558   HCO3 26.5 01/09/2019 0118   TCO2 21.5 02/24/2014 1558   O2SAT 51.4 01/09/2019 0118    CBG (last 3)  Recent Labs    06/16/19 2117 06/17/19 0730 06/17/19 1111  GLUCAP 192* 159* 205*      Signature:  Chesley Mires, MD [place] Pager - [phone] 06/17/2019, 1:59 PM

## 2019-06-17 NOTE — Discharge Summary (Signed)
Physician Discharge Summary  Wesley [name] SNK:[id number] DOB: 1965/09/12 DOA: 06/13/2019  PCP: [name], MD  Admit date: 06/13/2019 Discharge date: 06/17/2019  Admitted From: Home Disposition:  Home   Recommendations for Outpatient Follow-up:  1. Follow up with PCP in 1-2 weeks 2. Please obtain BMP/CBC in one week   Home Health: YES Equipment/Devices: 3-5L [place] (3L at rest; 5L with activity)  Discharge Condition: Stable CODE STATUS: FULL Diet recommendation: Heart Healthy   Brief/Interim Summary: 54 year old male with a history of chronic respiratory failure on 3 L, COPD, asbestosis, chronic back pain on chronic opioids presenting withshortness of breath for at least 2 weeks. The patient stated that he went to see his primary care provider on 06/05/2019 at which time he was placed on doxycycline and prednisone. He was encouraged for admission at that time, but he refused. The patient finished his last dose of doxycycline on the morning of 06/12/2019. He continues on his prednisone taper. Unfortunately he continued to have worsening shortness of breath. He has had some subjective fevers and chills. He denies any hemoptysis but complains of a cough with occasional posttussive emesis.He continues to have his usual left-sided rib pain. He denies any recent injury or trauma. He states that whenever his breathing gets worse he notices worsening left rib pain. He occasionally has yellow sputum with his cough. He states that he has had to increase his oxygen to 5 L because of the shortness of breath. He normally is on 3 L at home at rest.  The patient also complains of some lower left abdominal wall redness that started after what he felt to be a tick bite. He states that over 24 hours it has increased in erythema. In the emergency department, the patient was afebrile hemodynamic stable.Oxygen saturation was 95% on 5 L. BMP, LFTs, and CBC were essentially unremarkable except  for WBC 16.1. EKG shows sinus rhythm with nonspecific T wave changes. Troponins were unremarkable. Chest x-ray showed no left lower lobe scar. The patient was admitted for further treatment of his COPD exacerbation  Discharge Diagnoses:  Acute on chronic respiratory failure with hypoxia -Secondary COPD exacerbation -Chronically on 3 L at home at rest; 5L with activity -now stable on 5L -Wean oxygen back to baseline as tolerated -SARS-CoV2 RT-PCR is negative  Atypical chest pain -Likely combination of muscular skeletal strain aswell as viscerosomatic response  -Troponins negative x2 -07/18/2016 heart catheterization--normal coronaries -Personally reviewed EKG--sinus rhythm, nonspecific T wave changes  -ContninuedRobaxin  COPD exacerbation/Upper airway cough syndrome -ContinuePulmicort -Continuedduo nebs -ContinuedBrovana -continuedmucomyst -continueIV solumedrol--60 mg q 6hr>>>d/c home with prednisone taper -pt likely has component of vocal cord dysfunction-->wheeze -CT chest--severe emphysema--Suspected left upper lobe wedge resection with scarring in the lingula;  Additional right upper lobe wedge resection -appreciate pulmonary consult-->keep saturation 88-95%  Abdominal Wall cellulitis -continuecefazolin -finished doxy 06/12/19 and had a dose in ED -home with cephalexin x 3 more days to finish on week  Chronic pain syndrome -Continue home dose of MS Contin -reviewed PMP AWARE--patient receives monthly MS Contin30 mg #90,oxycodone30 mg #90,and Lyrica 150 mg#36fom one provider -UDS--positive opiates  Constipation -start miralax and senna--noBM -give milk and molasses enema--pt refused  Intermittent bilateral hand weakness -present for 2 months -occasionally accompanied by confusion -CT brain neg -EEG--suggestive of moderate diffuse encephalopathy  Leg edema -venous duplex--neg -echo--EF 65-70%, no WMA, trivial MR -urine protein/creatinine  ratio--0.13  Discharge Instructions   Allergies as of 06/17/2019      Reactions   Ambien [zolpidem  Tartrate] Other (See Comments)   hallucinations   Melatonin Itching   Zolpidem Other (See Comments)   Altered mental status      Medication List    STOP taking these medications   doxycycline 100 MG tablet Commonly known as: VIBRA-TABS     TAKE these medications   acetaminophen 325 MG tablet Commonly known as: TYLENOL Take 2 tablets by mouth every 4 (four) hours.   Advair Diskus 250-50 MCG/DOSE Aepb Generic drug: Fluticasone-Salmeterol Inhale 1 puff into the lungs 2 (two) times daily.   albuterol (2.5 MG/3ML) 0.083% nebulizer solution Commonly known as: PROVENTIL Take 3 mLs (2.5 mg total) by nebulization every 6 (six) hours as needed for wheezing or shortness of breath. What changed: when to take this   albuterol 108 (90 Base) MCG/ACT inhaler Commonly known as: VENTOLIN HFA Inhale 2 puffs into the lungs every 4 (four) hours as needed for wheezing or shortness of breath. Shortness of breath What changed: Another medication with the same name was changed. Make sure you understand how and when to take each.   aspirin 81 MG chewable tablet Chew 1 tablet (81 mg total) by mouth daily.   budesonide-formoterol 160-4.5 MCG/ACT inhaler Commonly known as: SYMBICORT Inhale 3 puffs into the lungs 4 (four) times daily.   cephALEXin 500 MG capsule Commonly known as: KEFLEX Take 1 capsule (500 mg total) by mouth every 6 (six) hours.   furosemide 40 MG tablet Commonly known as: LASIX Take 40 mg by mouth daily.   Incruse Ellipta 62.5 MCG/INH Aepb Generic drug: umeclidinium bromide Inhale 1 puff into the lungs daily.   loperamide 2 MG capsule Commonly known as: IMODIUM Take 1 capsule by mouth 4 (four) times daily.   montelukast 10 MG tablet Commonly known as: SINGULAIR Take 1 tablet by mouth daily.   morphine 30 MG 12 hr tablet Commonly known as: MS CONTIN Take 30 mg by  mouth every 8 (eight) hours.   mupirocin ointment 2 % Commonly known as: BACTROBAN Place 1 application into the nose daily.   Narcan 4 MG/0.1ML Liqd nasal spray kit Generic drug: naloxone Place 4 mg into the nose as directed. To prevent overdose   ondansetron 4 MG disintegrating tablet Commonly known as: ZOFRAN-ODT Take 1 tablet by mouth every 8 (eight) hours as needed.   oxycodone 30 MG immediate release tablet Commonly known as: ROXICODONE Take 30 mg by mouth every 4 (four) hours as needed for pain.   predniSONE 10 MG tablet Commonly known as: DELTASONE Take 6 tablets (60 mg total) by mouth daily with breakfast. And decrease by 1 tablet every 2 days Start taking on: June 18, 2019   pregabalin 150 MG capsule Commonly known as: LYRICA Take 1 capsule by mouth 3 (three) times daily.   Systane 0.4-0.3 % Soln Generic drug: Polyethyl Glycol-Propyl Glycol Place 1-2 drops into the left eye daily as needed (for dry eye relief).   tiotropium 18 MCG inhalation capsule Commonly known as: SPIRIVA Place 1 capsule (18 mcg total) into inhaler and inhale daily.       Allergies  Allergen Reactions  . Ambien [Zolpidem Tartrate] Other (See Comments)    hallucinations  . Melatonin Itching  . Zolpidem Other (See Comments)    Altered mental status    Consultations:  pulmonary   Procedures/Studies: CT HEAD WO CONTRAST  Result Date: 06/14/2019 CLINICAL DATA:  54 year old male with bilateral upper extremity weakness, confusion, recent tick bite. EXAM: CT HEAD WITHOUT CONTRAST TECHNIQUE: Contiguous axial images  were obtained from the base of the skull through the vertex without intravenous contrast. COMPARISON:  Head CT 04/02/2016. Brain MRI 04/03/2016. FINDINGS: Brain: Cerebral volume remains normal. No midline shift, ventriculomegaly, mass effect, evidence of mass lesion, intracranial hemorrhage or evidence of cortically based acute infarction. Gray-white matter differentiation is  within normal limits throughout the brain. Vascular: No suspicious intracranial vascular hyperdensity. Skull: Negative. Sinuses/Orbits: Visualized paranasal sinuses and mastoids are clear. Other: Stable and negative orbit and scalp soft tissues. IMPRESSION: Stable and normal noncontrast CT appearance of the brain. Electronically Signed   By: Genevie Ann M.D.   On: 06/14/2019 17:28   CT CHEST WO CONTRAST  Result Date: 06/15/2019 CLINICAL DATA:  Shortness of breath x2 weeks, COPD exacerbation, history of left lung tumor status post surgery in 2017, pulmonary asbestosis EXAM: CT CHEST WITHOUT CONTRAST TECHNIQUE: Multidetector CT imaging of the chest was performed following the standard protocol without IV contrast. COMPARISON:  CTA chest dated 01/08/2019 FINDINGS: Cardiovascular: The heart is normal in size. No pericardial effusion. No evidence of thoracic aortic aneurysm. Mild atherosclerotic calcifications of the aortic arch. Mediastinum/Nodes: No suspicious mediastinal lymphadenopathy. Visualized thyroid is unremarkable. Lungs/Pleura: Severe emphysema with bullous changes at the lung apices. Suspected left upper lobe wedge resection with scarring in the lingula (series 4/image 93). Additional right upper lobe wedge resection (series 4/image 103). No suspicious pulmonary nodules. No focal consolidation. No pleural effusion or pneumothorax. Upper Abdomen: Visualized upper abdomen is grossly unremarkable. Musculoskeletal: Visualized osseous structures are within normal limits. IMPRESSION: No evidence of acute cardiopulmonary disease. Postsurgical changes in the bilateral upper lobes. Aortic Atherosclerosis (ICD10-I70.0) and Emphysema (ICD10-J43.9). Electronically Signed   By: Julian Hy M.D.   On: 06/15/2019 15:49   US Venous Img Lower Bilateral (DVT)  Result Date: 06/16/2019 CLINICAL DATA:  Acute bilateral lower extremity pain. EXAM: Bilateral LOWER EXTREMITY VENOUS DOPPLER ULTRASOUND TECHNIQUE: Gray-scale  sonography with compression, as well as color and duplex ultrasound, were performed to evaluate the deep venous system(s) from the level of the common femoral vein through the popliteal and proximal calf veins. COMPARISON:  None. FINDINGS: VENOUS Normal compressibility of the common femoral, superficial femoral, and popliteal veins, as well as the visualized calf veins. Visualized portions of profunda femoral vein and great saphenous vein unremarkable. No filling defects to suggest DVT on grayscale or color Doppler imaging. Doppler waveforms show normal direction of venous flow, normal respiratory phasicity and response to augmentation. Limited views of the contralateral common femoral vein are unremarkable. OTHER None. Limitations: none IMPRESSION: No femoropopliteal DVT nor evidence of DVT within the visualized calf veins. If clinical symptoms are inconsistent or if there are persistent or worsening symptoms, further imaging (possibly involving the iliac veins) may be warranted. Electronically Signed   By: Marijo Conception M.D.   On: 06/16/2019 10:09   DG Chest Portable 1 View  Result Date: 06/13/2019 CLINICAL DATA:  Shortness of breath. EXAM: PORTABLE CHEST 1 VIEW COMPARISON:  01/08/2019.  01/17/2017. FINDINGS: Mediastinum and hilar structures normal. Heart size normal. No focal infiltrate. COPD. Stable left base pleuroparenchymal thickening consistent with scarring. Stable elevation left hemidiaphragm. No acute bony abnormality. IMPRESSION: No focal infiltrate. COPD. Stable left base pleuroparenchymal thickening consistent with scarring. Electronically Signed   By: Marcello Moores  Register   On: 06/13/2019 09:39   EEG adult  Result Date: 06/16/2019 Lora Havens, MD     06/16/2019  4:56 PM Patient Name: Wesley [name] MRN: [id number] Epilepsy Attending: Lora Havens Referring Physician/Provider: Dr.  Shanon Brow Camp Gopal Date: 06/16/2019 Duration: 23.23 mins Patient history: 54 year old male admitted for shortness of  breath.  Also noted to have intermittent bilateral hand weakness occasionally accompanied by confusion.  EEG to evaluate for seizures. Level of alertness: Awake AEDs during EEG study: Pregabalin Technical aspects: This EEG study was done with scalp electrodes positioned according to the 10-20 International system of electrode placement. Electrical activity was acquired at a sampling rate of 500Hz  and reviewed with a high frequency filter of 70Hz  and a low frequency filter of 1Hz . EEG data were recorded continuously and digitally stored. Description: No clear posterior dominant rhythm was seen. EEG showed continuous generalized polymorphic 3-6hz  theta-delta slowing. Hyperventilation and photic stimulation were not performed. ABNORMALITY - Continuous slow, generalized IMPRESSION: This study is suggestive of moderate diffuse encephalopathy, non specific to etiology. No seizures or epileptiform discharges were seen throughout the recording. Lora Havens   ECHOCARDIOGRAM COMPLETE  Result Date: 06/16/2019    ECHOCARDIOGRAM REPORT   Patient Name:   Wesley [name] Date of Exam: 06/16/2019 Medical Rec #:  [id number]        Height:       73.0 in Accession #:    [id number]       Weight:       181.0 lb Date of Birth:  01/20/1966        BSA:          2.062 m Patient Age:    53 years         BP:           141/71 mmHg Patient Gender: M                HR:           94 bpm. Exam Location:  [facility] Procedure: 2D Echo Indications:    Dyspnea 786.09 / R06.00  History:        Patient has prior history of Echocardiogram examinations, most                 recent 07/24/2011. COPD, Signs/Symptoms:Chest Pain and Fever;                 Risk Factors:Current Smoker. Acute coronary syndrome , History                 of DVT , Pulmonary infection, Opiate abuse.  Sonographer:    Leavy Cella RDCS (AE) Referring Phys: [phone] Clark Cuff IMPRESSIONS  1. Left ventricular ejection fraction, by estimation, is 65 to 70%. The left ventricle has  normal function. The left ventricle has no regional wall motion abnormalities. Left ventricular diastolic parameters are indeterminate.  2. Right ventricular systolic function is normal. The right ventricular size is normal. Tricuspid regurgitation signal is inadequate for assessing PA pressure.  3. The mitral valve is grossly normal. Trivial mitral valve regurgitation.  4. The aortic valve is tricuspid. Aortic valve regurgitation is not visualized.  5. The inferior vena cava is normal in size with greater than 50% respiratory variability, suggesting right atrial pressure of 3 mmHg. FINDINGS  Left Ventricle: Left ventricular ejection fraction, by estimation, is 65 to 70%. The left ventricle has normal function. The left ventricle has no regional wall motion abnormalities. The left ventricular internal cavity size was normal in size. There is  no left ventricular hypertrophy. Left ventricular diastolic parameters are indeterminate. Right Ventricle: The right ventricular size is normal. No increase in right ventricular wall thickness. Right ventricular systolic function is normal.  Tricuspid regurgitation signal is inadequate for assessing PA pressure. Left Atrium: Left atrial size was normal in size. Right Atrium: Right atrial size was normal in size. Pericardium: There is no evidence of pericardial effusion. Presence of pericardial fat pad. Mitral Valve: The mitral valve is grossly normal. Trivial mitral valve regurgitation. Tricuspid Valve: The tricuspid valve is grossly normal. Tricuspid valve regurgitation is trivial. Aortic Valve: The aortic valve is tricuspid. Aortic valve regurgitation is not visualized. Mild aortic valve annular calcification. Pulmonic Valve: The pulmonic valve was not well visualized. Pulmonic valve regurgitation is not visualized. Aorta: The aortic root is normal in size and structure. Venous: The inferior vena cava is normal in size with greater than 50% respiratory variability, suggesting  right atrial pressure of 3 mmHg. IAS/Shunts: No atrial level shunt detected by color flow Doppler.  LEFT VENTRICLE PLAX 2D LVIDd:         4.66 cm  Diastology LVIDs:         2.85 cm  LV e' lateral:   9.64 cm/s LV PW:         1.01 cm  LV E/e' lateral: 10.1 LV IVS:        0.93 cm  LV e' medial:    13.20 cm/s LVOT diam:     2.10 cm  LV E/e' medial:  7.4 LV SV:         77 LV SV Index:   37 LVOT Area:     3.46 cm  RIGHT VENTRICLE RV S prime:     24.20 cm/s TAPSE (M-mode): 2.6 cm LEFT ATRIUM             Index LA diam:        3.30 cm 1.60 cm/m LA Vol (A2C):   30.3 ml 14.69 ml/m LA Vol (A4C):   47.3 ml 22.94 ml/m LA Biplane Vol: 38.0 ml 18.43 ml/m  AORTIC VALVE LVOT Vmax:   120.00 cm/s LVOT Vmean:  75.400 cm/s LVOT VTI:    0.222 m  AORTA Ao Root diam: 3.00 cm MITRAL VALVE MV Area (PHT): 2.75 cm    SHUNTS MV Decel Time: 276 msec    Systemic VTI:  0.22 m MV E velocity: 97.30 cm/s  Systemic Diam: 2.10 cm MV A velocity: 49.60 cm/s MV E/A ratio:  1.96 Rozann Lesches MD Electronically signed by Rozann Lesches MD Signature Date/Time: 06/16/2019/11:49:28 AM    Final         Discharge Exam: Vitals:   06/17/19 1415 06/17/19 1424  BP:  129/61  Pulse:  (!) 109  Resp:  18  Temp:    SpO2: 96% 98%   Vitals:   06/16/19 2121 06/17/19 0728 06/17/19 1415 06/17/19 1424  BP: (!) 152/85   129/61  Pulse: 100   (!) 109  Resp: 20   18  Temp: 98.7 F (37.1 C)     TempSrc: Oral     SpO2: 96% 97% 96% 98%  Weight:      Height:        General: Pt is alert, awake, not in acute distress Cardiovascular: RRR, S1/S2 +, no rubs, no gallops Respiratory: diminished BS bilateral.  Bibasilar rales.  Minimal exp wheeze Abdominal: Soft, NT, ND, bowel sounds + Extremities: 1+ LE edema, no cyanosis   The results of significant diagnostics from this hospitalization (including imaging, microbiology, ancillary and laboratory) are listed below for reference.    Significant Diagnostic Studies: CT HEAD WO CONTRAST  Result  Date: 06/14/2019 CLINICAL DATA:  53 year old  male with bilateral upper extremity weakness, confusion, recent tick bite. EXAM: CT HEAD WITHOUT CONTRAST TECHNIQUE: Contiguous axial images were obtained from the base of the skull through the vertex without intravenous contrast. COMPARISON:  Head CT 04/02/2016. Brain MRI 04/03/2016. FINDINGS: Brain: Cerebral volume remains normal. No midline shift, ventriculomegaly, mass effect, evidence of mass lesion, intracranial hemorrhage or evidence of cortically based acute infarction. Gray-white matter differentiation is within normal limits throughout the brain. Vascular: No suspicious intracranial vascular hyperdensity. Skull: Negative. Sinuses/Orbits: Visualized paranasal sinuses and mastoids are clear. Other: Stable and negative orbit and scalp soft tissues. IMPRESSION: Stable and normal noncontrast CT appearance of the brain. Electronically Signed   By: Genevie Ann M.D.   On: 06/14/2019 17:28   CT CHEST WO CONTRAST  Result Date: 06/15/2019 CLINICAL DATA:  Shortness of breath x2 weeks, COPD exacerbation, history of left lung tumor status post surgery in 2017, pulmonary asbestosis EXAM: CT CHEST WITHOUT CONTRAST TECHNIQUE: Multidetector CT imaging of the chest was performed following the standard protocol without IV contrast. COMPARISON:  CTA chest dated 01/08/2019 FINDINGS: Cardiovascular: The heart is normal in size. No pericardial effusion. No evidence of thoracic aortic aneurysm. Mild atherosclerotic calcifications of the aortic arch. Mediastinum/Nodes: No suspicious mediastinal lymphadenopathy. Visualized thyroid is unremarkable. Lungs/Pleura: Severe emphysema with bullous changes at the lung apices. Suspected left upper lobe wedge resection with scarring in the lingula (series 4/image 93). Additional right upper lobe wedge resection (series 4/image 103). No suspicious pulmonary nodules. No focal consolidation. No pleural effusion or pneumothorax. Upper Abdomen:  Visualized upper abdomen is grossly unremarkable. Musculoskeletal: Visualized osseous structures are within normal limits. IMPRESSION: No evidence of acute cardiopulmonary disease. Postsurgical changes in the bilateral upper lobes. Aortic Atherosclerosis (ICD10-I70.0) and Emphysema (ICD10-J43.9). Electronically Signed   By: Julian Hy M.D.   On: 06/15/2019 15:49   US Venous Img Lower Bilateral (DVT)  Result Date: 06/16/2019 CLINICAL DATA:  Acute bilateral lower extremity pain. EXAM: Bilateral LOWER EXTREMITY VENOUS DOPPLER ULTRASOUND TECHNIQUE: Gray-scale sonography with compression, as well as color and duplex ultrasound, were performed to evaluate the deep venous system(s) from the level of the common femoral vein through the popliteal and proximal calf veins. COMPARISON:  None. FINDINGS: VENOUS Normal compressibility of the common femoral, superficial femoral, and popliteal veins, as well as the visualized calf veins. Visualized portions of profunda femoral vein and great saphenous vein unremarkable. No filling defects to suggest DVT on grayscale or color Doppler imaging. Doppler waveforms show normal direction of venous flow, normal respiratory phasicity and response to augmentation. Limited views of the contralateral common femoral vein are unremarkable. OTHER None. Limitations: none IMPRESSION: No femoropopliteal DVT nor evidence of DVT within the visualized calf veins. If clinical symptoms are inconsistent or if there are persistent or worsening symptoms, further imaging (possibly involving the iliac veins) may be warranted. Electronically Signed   By: Marijo Conception M.D.   On: 06/16/2019 10:09   DG Chest Portable 1 View  Result Date: 06/13/2019 CLINICAL DATA:  Shortness of breath. EXAM: PORTABLE CHEST 1 VIEW COMPARISON:  01/08/2019.  01/17/2017. FINDINGS: Mediastinum and hilar structures normal. Heart size normal. No focal infiltrate. COPD. Stable left base pleuroparenchymal thickening  consistent with scarring. Stable elevation left hemidiaphragm. No acute bony abnormality. IMPRESSION: No focal infiltrate. COPD. Stable left base pleuroparenchymal thickening consistent with scarring. Electronically Signed   By: Marcello Moores  Register   On: 06/13/2019 09:39   EEG adult  Result Date: 06/16/2019 Lora Havens, MD  06/16/2019  4:56 PM Patient Name: JAMAIR CATO MRN: [id number] Epilepsy Attending: Lora Havens Referring Physician/Provider: Dr. Shanon Brow Atthew Coutant Date: 06/16/2019 Duration: 23.23 mins Patient history: 54 year old male admitted for shortness of breath.  Also noted to have intermittent bilateral hand weakness occasionally accompanied by confusion.  EEG to evaluate for seizures. Level of alertness: Awake AEDs during EEG study: Pregabalin Technical aspects: This EEG study was done with scalp electrodes positioned according to the 10-20 International system of electrode placement. Electrical activity was acquired at a sampling rate of 500Hz  and reviewed with a high frequency filter of 70Hz  and a low frequency filter of 1Hz . EEG data were recorded continuously and digitally stored. Description: No clear posterior dominant rhythm was seen. EEG showed continuous generalized polymorphic 3-6hz  theta-delta slowing. Hyperventilation and photic stimulation were not performed. ABNORMALITY - Continuous slow, generalized IMPRESSION: This study is suggestive of moderate diffuse encephalopathy, non specific to etiology. No seizures or epileptiform discharges were seen throughout the recording. Lora Havens   ECHOCARDIOGRAM COMPLETE  Result Date: 06/16/2019    ECHOCARDIOGRAM REPORT   Patient Name:   NAEEM QUILLIN Date of Exam: 06/16/2019 Medical Rec #:  [id number]        Height:       73.0 in Accession #:    [id number]       Weight:       181.0 lb Date of Birth:  May 17, 1965        BSA:          2.062 m Patient Age:    78 years         BP:           141/71 mmHg Patient Gender: M                HR:            94 bpm. Exam Location:  [facility] Procedure: 2D Echo Indications:    Dyspnea 786.09 / R06.00  History:        Patient has prior history of Echocardiogram examinations, most                 recent 07/24/2011. COPD, Signs/Symptoms:Chest Pain and Fever;                 Risk Factors:Current Smoker. Acute coronary syndrome , History                 of DVT , Pulmonary infection, Opiate abuse.  Sonographer:    Leavy Cella RDCS (AE) Referring Phys: [phone] Hailee Hollick IMPRESSIONS  1. Left ventricular ejection fraction, by estimation, is 65 to 70%. The left ventricle has normal function. The left ventricle has no regional wall motion abnormalities. Left ventricular diastolic parameters are indeterminate.  2. Right ventricular systolic function is normal. The right ventricular size is normal. Tricuspid regurgitation signal is inadequate for assessing PA pressure.  3. The mitral valve is grossly normal. Trivial mitral valve regurgitation.  4. The aortic valve is tricuspid. Aortic valve regurgitation is not visualized.  5. The inferior vena cava is normal in size with greater than 50% respiratory variability, suggesting right atrial pressure of 3 mmHg. FINDINGS  Left Ventricle: Left ventricular ejection fraction, by estimation, is 65 to 70%. The left ventricle has normal function. The left ventricle has no regional wall motion abnormalities. The left ventricular internal cavity size was normal in size. There is  no left ventricular hypertrophy. Left ventricular diastolic parameters are indeterminate. Right Ventricle: The  right ventricular size is normal. No increase in right ventricular wall thickness. Right ventricular systolic function is normal. Tricuspid regurgitation signal is inadequate for assessing PA pressure. Left Atrium: Left atrial size was normal in size. Right Atrium: Right atrial size was normal in size. Pericardium: There is no evidence of pericardial effusion. Presence of pericardial fat pad. Mitral  Valve: The mitral valve is grossly normal. Trivial mitral valve regurgitation. Tricuspid Valve: The tricuspid valve is grossly normal. Tricuspid valve regurgitation is trivial. Aortic Valve: The aortic valve is tricuspid. Aortic valve regurgitation is not visualized. Mild aortic valve annular calcification. Pulmonic Valve: The pulmonic valve was not well visualized. Pulmonic valve regurgitation is not visualized. Aorta: The aortic root is normal in size and structure. Venous: The inferior vena cava is normal in size with greater than 50% respiratory variability, suggesting right atrial pressure of 3 mmHg. IAS/Shunts: No atrial level shunt detected by color flow Doppler.  LEFT VENTRICLE PLAX 2D LVIDd:         4.66 cm  Diastology LVIDs:         2.85 cm  LV e' lateral:   9.64 cm/s LV PW:         1.01 cm  LV E/e' lateral: 10.1 LV IVS:        0.93 cm  LV e' medial:    13.20 cm/s LVOT diam:     2.10 cm  LV E/e' medial:  7.4 LV SV:         77 LV SV Index:   37 LVOT Area:     3.46 cm  RIGHT VENTRICLE RV S prime:     24.20 cm/s TAPSE (M-mode): 2.6 cm LEFT ATRIUM             Index LA diam:        3.30 cm 1.60 cm/m LA Vol (A2C):   30.3 ml 14.69 ml/m LA Vol (A4C):   47.3 ml 22.94 ml/m LA Biplane Vol: 38.0 ml 18.43 ml/m  AORTIC VALVE LVOT Vmax:   120.00 cm/s LVOT Vmean:  75.400 cm/s LVOT VTI:    0.222 m  AORTA Ao Root diam: 3.00 cm MITRAL VALVE MV Area (PHT): 2.75 cm    SHUNTS MV Decel Time: 276 msec    Systemic VTI:  0.22 m MV E velocity: 97.30 cm/s  Systemic Diam: 2.10 cm MV A velocity: 49.60 cm/s MV E/A ratio:  1.96 Rozann Lesches MD Electronically signed by Rozann Lesches MD Signature Date/Time: 06/16/2019/11:49:28 AM    Final      Microbiology: Recent Results (from the past 240 hour(s))  SARS CORONAVIRUS 2 (Gavon Majano 6-24 HRS) Nasopharyngeal Nasopharyngeal Swab     Status: None   Collection Time: 06/13/19  9:40 AM   Specimen: Nasopharyngeal Swab  Result Value Ref Range Status   SARS Coronavirus 2 NEGATIVE  NEGATIVE Final    Comment: (NOTE) SARS-CoV-2 target nucleic acids are NOT DETECTED. The SARS-CoV-2 RNA is generally detectable in upper and lower respiratory specimens during the acute phase of infection. Negative results do not preclude SARS-CoV-2 infection, do not rule out co-infections with other pathogens, and should not be used as the sole basis for treatment or other patient management decisions. Negative results must be combined with clinical observations, patient history, and epidemiological information. The expected result is Negative. Fact Sheet for Patients: SugarRoll.be Fact Sheet for Healthcare Providers: https://www.woods-mathews.com/ This test is not yet approved or cleared by the [place] FDA and  has been authorized for detection and/or diagnosis of  SARS-CoV-2 by FDA under an Emergency Use Authorization (EUA). This EUA will remain  in effect (meaning this test can be used) for the duration of the COVID-19 declaration under Section 56 4(b)(1) of the Act, 21 U.S.C. section 360bbb-3(b)(1), unless the authorization is terminated or revoked sooner. Performed at [facility], [place] [address], [place], [place] [address]   Culture, Urine     Status: None   Collection Time: 06/14/19  3:09 PM   Specimen: Urine, Clean Catch  Result Value Ref Range Status   Specimen Description   Final    URINE, CLEAN CATCH Performed at [facility], [address], [place], [place] [address]    Special Requests   Final    NONE Performed at [facility], [address], [place], [place] [address]    Culture   Final    NO GROWTH Performed at [facility], [place] [address] [place], [place] [address]    Report Status 06/16/2019 FINAL  Final     Labs: Basic Metabolic Panel: Recent Labs  Lab 06/13/19 0948 06/13/19 0948 06/14/19 0714 06/14/19 0714 06/15/19 0639 06/16/19 0515  NA 135  --  137  --  136 140  K 3.9   < > 4.7   < >  4.2 4.2  CL 97*  --  94*  --  92* 94*  CO2 28  --  32  --  34* 36*  GLUCOSE 94  --  189*  --  160* 159*  BUN 22*  --  22*  --  15 17  CREATININE 0.83  --  0.85  --  0.82 0.74  CALCIUM 9.1  --  9.3  --  9.1 9.2  MG  --   --  2.3  --  2.3 2.5*  PHOS  --   --   --   --  3.2  --    < > = values in this interval not displayed.   Liver Function Tests: Recent Labs  Lab 06/13/19 0948 06/16/19 0515  AST 15 15  ALT 14 16  ALKPHOS 103 91  BILITOT 0.3 0.1*  PROT 7.2 6.3*  ALBUMIN 4.1 3.6   No results for input(s): LIPASE, AMYLASE in the last 168 hours. No results for input(s): AMMONIA in the last 168 hours. CBC: Recent Labs  Lab 06/13/19 0948 06/14/19 0714 06/16/19 0515  WBC 16.1* 14.9* 19.1*  NEUTROABS 11.3*  --   --   HGB 12.7* 12.9* 12.5*  HCT 40.8 43.6 41.3  MCV 98.6 102.1* 100.7*  PLT 392 413* 371   Cardiac Enzymes: No results for input(s): CKTOTAL, CKMB, CKMBINDEX, TROPONINI in the last 168 hours. BNP: Invalid input(s): POCBNP CBG: Recent Labs  Lab 06/16/19 1617 06/16/19 2115 06/16/19 2117 06/17/19 0730 06/17/19 1111  GLUCAP 126* 367* 192* 159* 205*    Time coordinating discharge:  36 minutes  Signed:  Orson Eva, DO [facility] Pager: [phone] 06/17/2019, 3:53 PM

## 2019-06-17 NOTE — Progress Notes (Signed)
IV removed. D/C instructions reviewed with patient, verbalized understanding. Patient to be transported to private vehicle via wheelchair. Will continue to monitor.

## 2019-06-17 NOTE — TOC Progression Note (Signed)
Transition of Care [facility]) - Progression Note    Patient Details  Name: DENARIO BAIK MRN: [id number] Date of Birth: 08-25-1965  Transition of Care [facility] CM/SW Contact  Boneta Lucks, RN Phone Number: 06/17/2019, 3:39 PM  Clinical Narrative:   Patient admitted with acute on chronic respiratory failure. Patient used home oxygen from [employer]. Patient needing a new tank for discharge home. Blake Divine is here and will delivery a tank to the room for discharge.

## 2019-06-18 ENCOUNTER — Telehealth: Payer: Self-pay | Admitting: Pulmonary Disease

## 2019-06-18 DIAGNOSIS — R69 Illness, unspecified: Secondary | ICD-10-CM | POA: Diagnosis not present

## 2019-06-18 NOTE — Telephone Encounter (Signed)
Please schedule a hospital follow up visit with me for [facility] in 2 weeks.

## 2019-06-18 NOTE — Telephone Encounter (Signed)
I checked the [place] schedule. You are not scheduled to be in [place] the week of April 26th nor May 3rd. Your next week back in [place] would be the week of May 10th, which will be a little over 3 weeks.   Are you ok with him waiting 3 weeks or would you prefer to have him come to [facility] and see one of the NPs? Thanks!

## 2019-06-19 NOTE — Telephone Encounter (Signed)
Okay to wait until I am in [place] office.

## 2019-06-20 NOTE — Telephone Encounter (Signed)
Spoke with patient. He has been scheduled to see VS in [place] on 07/14/19 at 1030am. He is aware of the location and time. Will print and send an appointment reminder to him as requested.   Nothing further needed at time of call.

## 2019-06-22 DIAGNOSIS — J441 Chronic obstructive pulmonary disease with (acute) exacerbation: Secondary | ICD-10-CM | POA: Diagnosis not present

## 2019-06-22 DIAGNOSIS — I279 Pulmonary heart disease, unspecified: Secondary | ICD-10-CM | POA: Diagnosis not present

## 2019-06-22 DIAGNOSIS — R269 Unspecified abnormalities of gait and mobility: Secondary | ICD-10-CM | POA: Diagnosis not present

## 2019-07-01 ENCOUNTER — Inpatient Hospital Stay (HOSPITAL_COMMUNITY)
Admission: EM | Admit: 2019-07-01 | Discharge: 2019-07-06 | DRG: 189 | Disposition: A | Discharge status: Home / Self Care | Payer: Medicare HMO | Attending: Internal Medicine | Admitting: Internal Medicine

## 2019-07-01 ENCOUNTER — Other Ambulatory Visit: Payer: Self-pay

## 2019-07-01 ENCOUNTER — Emergency Department (HOSPITAL_COMMUNITY): Payer: Medicare HMO

## 2019-07-01 ENCOUNTER — Encounter (HOSPITAL_COMMUNITY): Payer: Self-pay | Admitting: *Deleted

## 2019-07-01 DIAGNOSIS — J439 Emphysema, unspecified: Secondary | ICD-10-CM | POA: Diagnosis not present

## 2019-07-01 DIAGNOSIS — Z8249 Family history of ischemic heart disease and other diseases of the circulatory system: Secondary | ICD-10-CM | POA: Diagnosis not present

## 2019-07-01 DIAGNOSIS — M810 Age-related osteoporosis without current pathological fracture: Secondary | ICD-10-CM | POA: Diagnosis present

## 2019-07-01 DIAGNOSIS — Z8049 Family history of malignant neoplasm of other genital organs: Secondary | ICD-10-CM | POA: Diagnosis not present

## 2019-07-01 DIAGNOSIS — J441 Chronic obstructive pulmonary disease with (acute) exacerbation: Secondary | ICD-10-CM | POA: Diagnosis present

## 2019-07-01 DIAGNOSIS — Z8701 Personal history of pneumonia (recurrent): Secondary | ICD-10-CM | POA: Diagnosis not present

## 2019-07-01 DIAGNOSIS — Z9981 Dependence on supplemental oxygen: Secondary | ICD-10-CM | POA: Diagnosis not present

## 2019-07-01 DIAGNOSIS — Z20822 Contact with and (suspected) exposure to covid-19: Secondary | ICD-10-CM | POA: Diagnosis not present

## 2019-07-01 DIAGNOSIS — I872 Venous insufficiency (chronic) (peripheral): Secondary | ICD-10-CM | POA: Diagnosis present

## 2019-07-01 DIAGNOSIS — H5462 Unqualified visual loss, left eye, normal vision right eye: Secondary | ICD-10-CM | POA: Diagnosis present

## 2019-07-01 DIAGNOSIS — Z947 Corneal transplant status: Secondary | ICD-10-CM

## 2019-07-01 DIAGNOSIS — R609 Edema, unspecified: Secondary | ICD-10-CM

## 2019-07-01 DIAGNOSIS — Z86718 Personal history of other venous thrombosis and embolism: Secondary | ICD-10-CM

## 2019-07-01 DIAGNOSIS — M549 Dorsalgia, unspecified: Secondary | ICD-10-CM | POA: Diagnosis not present

## 2019-07-01 DIAGNOSIS — Z888 Allergy status to other drugs, medicaments and biological substances status: Secondary | ICD-10-CM

## 2019-07-01 DIAGNOSIS — J9612 Chronic respiratory failure with hypercapnia: Secondary | ICD-10-CM | POA: Diagnosis not present

## 2019-07-01 DIAGNOSIS — R0602 Shortness of breath: Secondary | ICD-10-CM | POA: Diagnosis not present

## 2019-07-01 DIAGNOSIS — Z8709 Personal history of other diseases of the respiratory system: Secondary | ICD-10-CM | POA: Diagnosis not present

## 2019-07-01 DIAGNOSIS — Z96641 Presence of right artificial hip joint: Secondary | ICD-10-CM | POA: Diagnosis present

## 2019-07-01 DIAGNOSIS — G629 Polyneuropathy, unspecified: Secondary | ICD-10-CM | POA: Diagnosis present

## 2019-07-01 DIAGNOSIS — J9611 Chronic respiratory failure with hypoxia: Secondary | ICD-10-CM

## 2019-07-01 DIAGNOSIS — G894 Chronic pain syndrome: Secondary | ICD-10-CM | POA: Diagnosis present

## 2019-07-01 DIAGNOSIS — R600 Localized edema: Secondary | ICD-10-CM | POA: Diagnosis not present

## 2019-07-01 DIAGNOSIS — Z77090 Contact with and (suspected) exposure to asbestos: Secondary | ICD-10-CM | POA: Diagnosis present

## 2019-07-01 DIAGNOSIS — F17290 Nicotine dependence, other tobacco product, uncomplicated: Secondary | ICD-10-CM | POA: Diagnosis present

## 2019-07-01 DIAGNOSIS — Z7982 Long term (current) use of aspirin: Secondary | ICD-10-CM

## 2019-07-01 DIAGNOSIS — J9621 Acute and chronic respiratory failure with hypoxia: Secondary | ICD-10-CM | POA: Diagnosis present

## 2019-07-01 DIAGNOSIS — K5900 Constipation, unspecified: Secondary | ICD-10-CM | POA: Diagnosis not present

## 2019-07-01 DIAGNOSIS — Z7951 Long term (current) use of inhaled steroids: Secondary | ICD-10-CM

## 2019-07-01 DIAGNOSIS — R0902 Hypoxemia: Secondary | ICD-10-CM

## 2019-07-01 DIAGNOSIS — J9620 Acute and chronic respiratory failure, unspecified whether with hypoxia or hypercapnia: Secondary | ICD-10-CM | POA: Diagnosis present

## 2019-07-01 DIAGNOSIS — Z79899 Other long term (current) drug therapy: Secondary | ICD-10-CM

## 2019-07-01 DIAGNOSIS — J984 Other disorders of lung: Secondary | ICD-10-CM | POA: Diagnosis present

## 2019-07-01 DIAGNOSIS — Z833 Family history of diabetes mellitus: Secondary | ICD-10-CM

## 2019-07-01 DIAGNOSIS — F17210 Nicotine dependence, cigarettes, uncomplicated: Secondary | ICD-10-CM | POA: Diagnosis present

## 2019-07-01 DIAGNOSIS — F1120 Opioid dependence, uncomplicated: Secondary | ICD-10-CM | POA: Diagnosis present

## 2019-07-01 DIAGNOSIS — R69 Illness, unspecified: Secondary | ICD-10-CM | POA: Diagnosis not present

## 2019-07-01 DIAGNOSIS — R000 Tachycardia, unspecified: Secondary | ICD-10-CM | POA: Diagnosis not present

## 2019-07-01 LAB — BASIC METABOLIC PANEL
Anion gap: 10 (ref 5–15)
BUN: 17 mg/dL (ref 6–20)
CO2: 32 mmol/L (ref 22–32)
Calcium: 9.2 mg/dL (ref 8.9–10.3)
Chloride: 95 mmol/L — ABNORMAL LOW (ref 98–111)
Creatinine, Ser: 0.83 mg/dL (ref 0.61–1.24)
GFR calc Af Amer: >60 mL/min (ref >60)
GFR calc non Af Amer: >60 mL/min (ref >60)
Glucose, Bld: 144 mg/dL — ABNORMAL HIGH (ref 70–99)
Potassium: 3.8 mmol/L (ref 3.5–5.1)
Sodium: 137 mmol/L (ref 135–145)

## 2019-07-01 LAB — CBC
HCT: 49.4 % (ref 39.0–52.0)
Hemoglobin: 15.1 g/dL (ref 13.0–17.0)
MCH: 30.5 pg (ref 26.0–34.0)
MCHC: 30.6 g/dL (ref 30.0–36.0)
MCV: 99.8 fL (ref 80.0–100.0)
Platelets: 275 10*3/uL (ref 150–400)
RBC: 4.95 MIL/uL (ref 4.22–5.81)
RDW: 12.8 % (ref 11.5–15.5)
WBC: 16.3 10*3/uL — ABNORMAL HIGH (ref 4.0–10.5)
nRBC: 0 % (ref 0.0–0.2)

## 2019-07-01 LAB — RESPIRATORY PANEL BY RT PCR (FLU A&B, COVID)
Influenza A by PCR: NEGATIVE
Influenza B by PCR: NEGATIVE
SARS Coronavirus 2 by RT PCR: NEGATIVE

## 2019-07-01 LAB — MRSA PCR SCREENING: MRSA by PCR: NEGATIVE

## 2019-07-01 LAB — BRAIN NATRIURETIC PEPTIDE: B Natriuretic Peptide: 19 pg/mL (ref 0.0–100.0)

## 2019-07-01 LAB — GLUCOSE, CAPILLARY
Glucose-Capillary: 276 mg/dL — ABNORMAL HIGH (ref 70–99)
Glucose-Capillary: 351 mg/dL — ABNORMAL HIGH (ref 70–99)

## 2019-07-01 MED ORDER — ALBUTEROL SULFATE HFA 108 (90 BASE) MCG/ACT IN AERS: 6.0000 | INHALATION_SPRAY | Freq: Once | RESPIRATORY_TRACT | Status: DC

## 2019-07-01 MED ORDER — IPRATROPIUM BROMIDE 0.02 % IN SOLN
0.5000 mg | Freq: Once | RESPIRATORY_TRACT | Status: AC
  Administered 2019-07-01: 0.5 mg via RESPIRATORY_TRACT
  Filled 2019-07-01: qty 2.5

## 2019-07-01 MED ORDER — MORPHINE SULFATE ER 30 MG PO TBCR
30.0000 mg | EXTENDED_RELEASE_TABLET | Freq: Three times a day (TID) | ORAL | Status: DC
  Administered 2019-07-01 – 2019-07-06 (×17): 30 mg via ORAL
  Filled 2019-07-01 (×4): qty 1
  Filled 2019-07-01: qty 2
  Filled 2019-07-01 (×7): qty 1
  Filled 2019-07-01: qty 2
  Filled 2019-07-01: qty 1
  Filled 2019-07-01: qty 2
  Filled 2019-07-01 (×3): qty 1

## 2019-07-01 MED ORDER — OXYCODONE-ACETAMINOPHEN 5-325 MG PO TABS
2.0000 | ORAL_TABLET | Freq: Once | ORAL | Status: AC
  Administered 2019-07-01: 2 via ORAL
  Filled 2019-07-01: qty 2

## 2019-07-01 MED ORDER — INSULIN ASPART 100 UNIT/ML ~~LOC~~ SOLN
0.0000 [IU] | Freq: Three times a day (TID) | SUBCUTANEOUS | Status: DC
  Administered 2019-07-01: 18:00:00 8 [IU] via SUBCUTANEOUS
  Administered 2019-07-02: 3 [IU] via SUBCUTANEOUS
  Administered 2019-07-02: 2 [IU] via SUBCUTANEOUS
  Administered 2019-07-02: 5 [IU] via SUBCUTANEOUS
  Administered 2019-07-03 (×2): 2 [IU] via SUBCUTANEOUS
  Administered 2019-07-04: 3 [IU] via SUBCUTANEOUS
  Administered 2019-07-04: 2 [IU] via SUBCUTANEOUS
  Administered 2019-07-04: 5 [IU] via SUBCUTANEOUS
  Administered 2019-07-05: 3 [IU] via SUBCUTANEOUS
  Administered 2019-07-06: 2 [IU] via SUBCUTANEOUS

## 2019-07-01 MED ORDER — SODIUM CHLORIDE 0.9 % IV BOLUS
500.0000 mL | Freq: Once | INTRAVENOUS | Status: AC
  Administered 2019-07-01: 500 mL via INTRAVENOUS

## 2019-07-01 MED ORDER — ENOXAPARIN SODIUM 40 MG/0.4ML ~~LOC~~ SOLN
40.0000 mg | SUBCUTANEOUS | Status: DC
  Administered 2019-07-01: 40 mg via SUBCUTANEOUS
  Filled 2019-07-01: qty 0.4

## 2019-07-01 MED ORDER — BUDESONIDE 0.25 MG/2ML IN SUSP
0.2500 mg | Freq: Two times a day (BID) | RESPIRATORY_TRACT | Status: DC
  Administered 2019-07-01 – 2019-07-06 (×10): 0.25 mg via RESPIRATORY_TRACT
  Filled 2019-07-01 (×10): qty 2

## 2019-07-01 MED ORDER — INSULIN ASPART 100 UNIT/ML ~~LOC~~ SOLN
0.0000 [IU] | Freq: Every day | SUBCUTANEOUS | Status: DC
  Administered 2019-07-01: 5 [IU] via SUBCUTANEOUS

## 2019-07-01 MED ORDER — SODIUM CHLORIDE 0.9% FLUSH
3.0000 mL | Freq: Two times a day (BID) | INTRAVENOUS | Status: DC
  Administered 2019-07-01: 3 mL via INTRAVENOUS
  Administered 2019-07-01: 15:00:00 10 mL via INTRAVENOUS
  Administered 2019-07-02 – 2019-07-06 (×8): 3 mL via INTRAVENOUS

## 2019-07-01 MED ORDER — IPRATROPIUM-ALBUTEROL 0.5-2.5 (3) MG/3ML IN SOLN
3.0000 mL | Freq: Four times a day (QID) | RESPIRATORY_TRACT | Status: DC
  Administered 2019-07-01 – 2019-07-06 (×19): 3 mL via RESPIRATORY_TRACT
  Filled 2019-07-01 (×20): qty 3

## 2019-07-01 MED ORDER — ONDANSETRON HCL 4 MG/2ML IJ SOLN: 4.0000 mg | Freq: Four times a day (QID) | INTRAMUSCULAR | Status: DC | PRN

## 2019-07-01 MED ORDER — ALBUTEROL (5 MG/ML) CONTINUOUS INHALATION SOLN
15.0000 mg | INHALATION_SOLUTION | RESPIRATORY_TRACT | Status: DC
  Administered 2019-07-01: 15 mg via RESPIRATORY_TRACT
  Filled 2019-07-01: qty 20

## 2019-07-01 MED ORDER — ASPIRIN 81 MG PO CHEW
81.0000 mg | CHEWABLE_TABLET | Freq: Every day | ORAL | Status: DC
  Administered 2019-07-01 – 2019-07-06 (×6): 81 mg via ORAL
  Filled 2019-07-01 (×6): qty 1

## 2019-07-01 MED ORDER — ONDANSETRON HCL 4 MG PO TABS: 4.0000 mg | ORAL_TABLET | Freq: Four times a day (QID) | ORAL | Status: DC | PRN

## 2019-07-01 MED ORDER — POLYETHYLENE GLYCOL 3350 17 G PO PACK
17.0000 g | PACK | Freq: Every day | ORAL | Status: DC
  Administered 2019-07-01 – 2019-07-06 (×6): 17 g via ORAL
  Filled 2019-07-01 (×6): qty 1

## 2019-07-01 MED ORDER — OXYCODONE HCL 5 MG PO TABS
30.0000 mg | ORAL_TABLET | ORAL | Status: DC | PRN
  Administered 2019-07-01 – 2019-07-06 (×29): 30 mg via ORAL
  Filled 2019-07-01 (×29): qty 6

## 2019-07-01 MED ORDER — SODIUM CHLORIDE 0.9 % IV SOLN
250.0000 mL | INTRAVENOUS | Status: DC | PRN
  Administered 2019-07-01 – 2019-07-04 (×2): 250 mL via INTRAVENOUS

## 2019-07-01 MED ORDER — NICOTINE 7 MG/24HR TD PT24
7.0000 mg | MEDICATED_PATCH | Freq: Every day | TRANSDERMAL | Status: DC
  Administered 2019-07-01: 7 mg via TRANSDERMAL
  Filled 2019-07-01 (×2): qty 1

## 2019-07-01 MED ORDER — ACETAMINOPHEN 325 MG PO TABS
650.0000 mg | ORAL_TABLET | ORAL | Status: DC
  Administered 2019-07-01 – 2019-07-02 (×5): 650 mg via ORAL
  Filled 2019-07-01 (×6): qty 2

## 2019-07-01 MED ORDER — METHYLPREDNISOLONE SODIUM SUCC 125 MG IJ SOLR
125.0000 mg | Freq: Once | INTRAMUSCULAR | Status: AC
  Administered 2019-07-01: 11:00:00 125 mg via INTRAVENOUS
  Filled 2019-07-01: qty 2

## 2019-07-01 MED ORDER — METHYLPREDNISOLONE SODIUM SUCC 125 MG IJ SOLR
60.0000 mg | Freq: Four times a day (QID) | INTRAMUSCULAR | Status: DC
  Administered 2019-07-01 – 2019-07-02 (×4): 60 mg via INTRAVENOUS
  Filled 2019-07-01 (×5): qty 2

## 2019-07-01 MED ORDER — FUROSEMIDE 40 MG PO TABS
40.0000 mg | ORAL_TABLET | Freq: Every day | ORAL | Status: DC
  Administered 2019-07-01 – 2019-07-06 (×6): 40 mg via ORAL
  Filled 2019-07-01 (×6): qty 1

## 2019-07-01 MED ORDER — DOXYCYCLINE HYCLATE 100 MG PO TABS
100.0000 mg | ORAL_TABLET | Freq: Two times a day (BID) | ORAL | Status: DC
  Administered 2019-07-01 – 2019-07-05 (×9): 100 mg via ORAL
  Filled 2019-07-01 (×9): qty 1

## 2019-07-01 MED ORDER — MONTELUKAST SODIUM 10 MG PO TABS
10.0000 mg | ORAL_TABLET | Freq: Every day | ORAL | Status: DC
  Administered 2019-07-01 – 2019-07-06 (×6): 10 mg via ORAL
  Filled 2019-07-01 (×6): qty 1

## 2019-07-01 MED ORDER — ACETYLCYSTEINE 20 % IN SOLN
2.0000 mL | Freq: Two times a day (BID) | RESPIRATORY_TRACT | Status: DC
  Administered 2019-07-01 – 2019-07-06 (×10): 2 mL via RESPIRATORY_TRACT
  Filled 2019-07-01 (×10): qty 4

## 2019-07-01 MED ORDER — PREGABALIN 75 MG PO CAPS
150.0000 mg | ORAL_CAPSULE | Freq: Three times a day (TID) | ORAL | Status: DC
  Administered 2019-07-01 – 2019-07-06 (×15): 150 mg via ORAL
  Filled 2019-07-01: qty 2
  Filled 2019-07-01 (×2): qty 6
  Filled 2019-07-01 (×12): qty 2

## 2019-07-01 MED ORDER — POLYVINYL ALCOHOL 1.4 % OP SOLN
1.0000 [drp] | Freq: Every day | OPHTHALMIC | Status: DC | PRN
  Filled 2019-07-01: qty 15

## 2019-07-01 MED ORDER — SODIUM CHLORIDE 0.9% FLUSH: 3.0000 mL | INTRAVENOUS | Status: DC | PRN

## 2019-07-01 MED ORDER — SODIUM CHLORIDE 0.9 % IV SOLN
1.0000 g | INTRAVENOUS | Status: DC
  Administered 2019-07-01 – 2019-07-02 (×2): 1 g via INTRAVENOUS
  Filled 2019-07-01 (×3): qty 10

## 2019-07-01 NOTE — H&P (Addendum)
History and Physical    BARTOSZ STRENGER [id number] DOB: 1965-10-03 DOA: 07/01/2019  PCP: [name], MD   Patient coming from: Home  Chief Complaint: Shortness of breath with cough and wheezing  HPI: Wesley [name] is a 54 y.o. male with medical history significant for severe bullous emphysema/COPD with chronic hypoxemia on 3-5 L nasal cannula oxygen, chronic back pain on chronic opioids, and asbestosis who was recently treated for COPD exacerbation and discharged on 06/17/2019.  He was seen by pulmonology during that admission and there were plans to follow-up in the outpatient clinic in 2 weeks.  Unfortunately, he presented to the ED today with worsening shortness of breath and cough productive of yellowish sputum that began yesterday.  He states that his condition has been worsening and he denies any fevers or chills.  He denies any chest pain or lower extremity swelling and has been using his home oxygen and breathing treatments with no improvement noted.  Recent 2D echocardiogram with LVEF 65-70% with no other acute findings.   ED Course: Vital signs demonstrating persistent tachycardia at this time.  1 view chest x-ray with findings of severe COPD, but nothing acute.  Covid and flu testing negative.  BNP 19.  Patient has been started on albuterol breathing treatments and has been given IV Solu-Medrol with minimal to no improvement noted as of yet.  Review of Systems: All others reviewed as noted above in HPI and otherwise negative.  Past Medical History:  Diagnosis Date  . Asbestos exposure   . Avascular necrosis of femur ([place])   . Back pain   . Bullous emphysema ([place])   . COPD (chronic obstructive pulmonary disease) ([place])   . Neuropathy   . On home oxygen therapy    "3L prn" (07/19/2016)  . Osteoporosis   . 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 BACK SURGERY    . 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 JOINT REPLACEMENT    . LEFT HEART CATH AND CORONARY ANGIOGRAPHY N/A 07/20/2016   Procedure: Left Heart Cath and Coronary Angiography;  Surgeon: Lorretta Harp, MD;  Location: [facility];  Service: Cardiovascular;  Laterality: N/A;  . LUNG SURGERY    . TOTAL HIP ARTHROPLASTY Right 09/24/2015   Procedure: TOTAL HIP ARTHROPLASTY ANTERIOR APPROACH;  Surgeon: Gaynelle Arabian, MD;  Location: [facility];  Service: Orthopedics;  Laterality: Right;     reports that he has been smoking e-cigarettes. He has been smoking about 1.00 pack per day. He has never used smokeless tobacco. He reports that he does not drink alcohol or use drugs.  Allergies  Allergen Reactions  . Ambien [Zolpidem Tartrate] Other (See Comments)    hallucinations  . Melatonin Itching  . Zolpidem Other (See Comments)    Altered mental status    Family History  Problem Relation Age of Onset  . Heart failure Father   . Diabetes Father   . Diabetes Mother   . Cancer Mother        male cancer    Prior to Admission medications   Medication Sig Start Date End Date Taking? Authorizing Provider  acetaminophen (TYLENOL) 325 MG tablet Take 2 tablets by mouth every 4 (four) hours.    [provider]  ADVAIR DISKUS 250-50 MCG/DOSE AEPB  Inhale 1 puff into the lungs 2 (two) times daily. 06/14/15   [provider]  albuterol (PROVENTIL HFA;VENTOLIN HFA) 108 (90 Base) MCG/ACT inhaler Inhale 2 puffs into the lungs every 4 (four) hours as needed for wheezing or shortness of breath. Shortness of breath 01/11/16   Isaac Bliss, Rayford Halsted, MD  albuterol (PROVENTIL) (2.5 MG/3ML) 0.083% nebulizer solution Take 3 mLs (2.5 mg total) by nebulization every 6 (six) hours as needed for wheezing or shortness of breath. Patient taking differently: Take 2.5 mg by nebulization every 4 (four) hours as needed for wheezing or shortness of breath.  02/27/14   Kathie Dike, MD  aspirin 81 MG chewable tablet Chew 1 tablet (81 mg total) by mouth daily. 07/20/16   Cheryln Manly, NP  budesonide-formoterol (SYMBICORT) 160-4.5 MCG/ACT inhaler Inhale 3 puffs into the lungs 4 (four) times daily.    [provider]  cephALEXin (KEFLEX) 500 MG capsule Take 1 capsule (500 mg total) by mouth every 6 (six) hours. 06/17/19   Orson Eva, MD  furosemide (LASIX) 40 MG tablet Take 40 mg by mouth daily.    [provider]  INCRUSE ELLIPTA 62.5 MCG/INH AEPB Inhale 1 puff into the lungs daily. 12/06/18   [provider]  loperamide (IMODIUM) 2 MG capsule Take 1 capsule by mouth 4 (four) times daily.    [provider]  montelukast (SINGULAIR) 10 MG tablet Take 1 tablet by mouth daily. 07/07/16   [provider]  morphine (MS CONTIN) 30 MG 12 hr tablet Take 30 mg by mouth every 8 (eight) hours. 12/30/18   [provider]  mupirocin ointment (BACTROBAN) 2 % Place 1 application into the nose daily. 12/03/18   [provider]  naloxone HCl (NARCAN) 4 MG/0.1ML LIQD Place 4 mg into the nose as directed. To prevent overdose    [provider]  ondansetron (ZOFRAN-ODT) 4 MG disintegrating tablet Take 1 tablet by mouth every 8 (eight) hours as needed. 09/05/18   [provider]  oxycodone (ROXICODONE) 30 MG immediate release tablet Take 30 mg by mouth every 4 (four) hours as needed for pain. 06/12/19   [provider]  Polyethyl Glycol-Propyl Glycol (SYSTANE) 0.4-0.3 % SOLN Place 1-2 drops into the left eye daily as needed (for dry eye relief).     [provider]  predniSONE (DELTASONE) 10 MG tablet Take 6 tablets (60 mg total) by mouth daily with breakfast. And decrease by 1 tablet every 2 days 06/18/19   Tat, Shanon Brow, MD  pregabalin (LYRICA) 150 MG capsule Take 1 capsule by mouth 3 (three) times daily. 12/30/18   [provider]  tiotropium (SPIRIVA) 18 MCG inhalation capsule Place 1  capsule (18 mcg total) into inhaler and inhale daily. 07/24/11   Doree Albee, MD    Physical Exam: Vitals:   07/01/19 1053 07/01/19 1055 07/01/19 1130 07/01/19 1200  BP:  (!) 144/80 133/67 102/85  Pulse:  (!) 128    Resp:   17 15  Temp: 98 F (36.7 C)     TempSrc: Oral     SpO2:  100%  100%  Weight: 82.1 kg     Height: 6\' 1"  (1.854 m)       Constitutional: Mild to moderate distress Vitals:   07/01/19 1053 07/01/19 1055 07/01/19 1130 07/01/19 1200  BP:  (!) 144/80 133/67 102/85  Pulse:  (!) 128    Resp:   17 15  Temp: 98 F (36.7 C)  TempSrc: Oral     SpO2:  100%  100%  Weight: 82.1 kg     Height: 6\' 1"  (1.854 m)      Eyes: lids and conjunctivae normal ENMT: Mucous membranes are moist.  Neck: normal, supple Respiratory: Increased respiratory effort with diminished breath sounds bilaterally noted wheezing.  Currently on breathing treatment.  Mild accessory muscle use. Cardiovascular: Tachycardic and regular, no murmurs. No extremity edema. Abdomen: no tenderness, no distention. Bowel sounds positive.  Musculoskeletal:  No joint deformity upper and lower extremities.   Skin: no rashes, lesions, ulcers.  Psychiatric: Flat affect  Labs on Admission: I have personally reviewed following labs and imaging studies  CBC: Recent Labs  Lab 07/01/19 1103  WBC 16.3*  HGB 15.1  HCT 49.4  MCV 99.8  PLT 123XX123   Basic Metabolic Panel: Recent Labs  Lab 07/01/19 1103  NA 137  K 3.8  CL 95*  CO2 32  GLUCOSE 144*  BUN 17  CREATININE 0.83  CALCIUM 9.2   GFR: Estimated Creatinine Clearance: 112.3 mL/min (by C-G formula based on SCr of 0.83 mg/dL). Liver Function Tests: No results for input(s): AST, ALT, ALKPHOS, BILITOT, PROT, ALBUMIN in the last 168 hours. No results for input(s): LIPASE, AMYLASE in the last 168 hours. No results for input(s): AMMONIA in the last 168 hours. Coagulation Profile: No results for input(s): INR, PROTIME in the last 168  hours. Cardiac Enzymes: No results for input(s): CKTOTAL, CKMB, CKMBINDEX, TROPONINI in the last 168 hours. BNP (last 3 results) No results for input(s): PROBNP in the last 8760 hours. HbA1C: No results for input(s): HGBA1C in the last 72 hours. CBG: No results for input(s): GLUCAP in the last 168 hours. Lipid Profile: No results for input(s): CHOL, HDL, LDLCALC, TRIG, CHOLHDL, LDLDIRECT in the last 72 hours. Thyroid Function Tests: No results for input(s): TSH, T4TOTAL, FREET4, T3FREE, THYROIDAB in the last 72 hours. Anemia Panel: No results for input(s): VITAMINB12, FOLATE, FERRITIN, TIBC, IRON, RETICCTPCT in the last 72 hours. Urine analysis:    Component Value Date/Time   COLORURINE STRAW (A) 06/14/2019 1508   APPEARANCEUR CLEAR 06/14/2019 1508   LABSPEC 1.009 06/14/2019 1508   PHURINE 6.0 06/14/2019 1508   GLUCOSEU 50 (A) 06/14/2019 1508   HGBUR NEGATIVE 06/14/2019 1508   BILIRUBINUR NEGATIVE 06/14/2019 1508   KETONESUR NEGATIVE 06/14/2019 1508   PROTEINUR NEGATIVE 06/14/2019 1508   UROBILINOGEN 0.2 05/23/2014 0102   NITRITE NEGATIVE 06/14/2019 1508   LEUKOCYTESUR NEGATIVE 06/14/2019 1508    Radiological Exams on Admission: DG Chest Portable 1 View  Result Date: 07/01/2019 CLINICAL DATA:  Shortness of breath. Hypoxia. EXAM: PORTABLE CHEST 1 VIEW COMPARISON:  CT chest 06/15/2019. One-view chest x-ray 06/13/2019. FINDINGS: Heart size is normal. Aortic atherosclerosis is again seen. Changes of severe COPD are evident. Scarring the left lung is stable. No acute superimposed disease is present. IMPRESSION: 1. Severe COPD. 2. No acute cardiopulmonary disease or significant interval change. Electronically Signed   By: San Morelle M.D.   On: 07/01/2019 11:44    EKG: Independently reviewed.  Sinus tachycardia with right atrial enlargement, 129 bpm.  Assessment/Plan Active Problems:   Acute exacerbation of chronic obstructive pulmonary disease (COPD) (HCC)    Acute  COPD exacerbation in setting of severe bullous emphysema -Prior history of bilateral bullectomy and is followed by Dr. Erie Noe with thoracic surgery at [facility] -Patient has chronic hypoxemia and wears 3-5 L nasal cannula at home, does not appear to have worsening hypoxemia on admission -Continue  on IV Solu-Medrol 60 mg every 6 hours -Pulmicort twice daily -Start Mucomyst -DuoNebs every 6 hours -Doxycycline 100 mg twice daily -COVID-19 and influenza studies negative -Pulmonology consulted and appreciate assistance  Chronic pain syndrome -With associated constipation -Continue home pain medications -Maintain on laxation and monitor bowel movements  History of asbestosis -Recent CT chest imaging did not show evidence of asbestos-related lung disease   DVT prophylaxis: SCDs Code Status: Full Family Communication: None at bedside, patient discussed with wife Disposition Plan: Admit for treatment of COPD exacerbation Consults called: Pulmonology Admission status: Inpatient, stepdown   Zillah Alexie D Aleea Hendry DO [facility]  If 7PM-7AM, please contact night-coverage [email or web address]  07/01/2019, 1:46 PM

## 2019-07-01 NOTE — ED Triage Notes (Signed)
Pt with sob since Saturday, wears 5 L/M O2 all the time at home.

## 2019-07-01 NOTE — ED Provider Notes (Signed)
[facility] EMERGENCY DEPARTMENT Provider Note   CSN: [id number] Arrival date & time: 07/01/19  1045     History Chief Complaint  Patient presents with  . Shortness of Breath    Wesley [name] is a 55 y.o. male.  Patient with history of asbestos exposure, COPD, home oxygen 3 to 5 L range, respiratory failure, multiple admissions, pneumonia, lung surgery 2017 left lung presents with worsening shortness of breath.  Patient has pain with coughing shortness of breath gradually worsening the past few days.  Patient has been on 5 L nasal cannula and breathing treatments with no improvement.  No fever.  Patient denies blood clot history, no recent surgeries.        Past Medical History:  Diagnosis Date  . Asbestos exposure   . Avascular necrosis of femur ([place])   . Back pain   . Bullous emphysema ([place])   . COPD (chronic obstructive pulmonary disease) ([place])   . Neuropathy   . On home oxygen therapy    "3L prn" (07/19/2016)  . Osteoporosis   . 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 Acute exacerbation of chronic obstructive pulmonary disease (COPD) ([place]) 07/01/2019  . Acute on chronic respiratory failure ([place]) 06/13/2019  . Acute on chronic respiratory failure with hypoxia ([place]) 06/13/2019  . Cellulitis of abdominal wall   . Leg pain   . SOB (shortness of breath) 01/09/2019  . Chronic respiratory failure with hypoxia ([place]) 01/09/2019  . CAP (community acquired pneumonia) 01/08/2019  . Normal coronary arteries 08/02/2016  . Atypical chest pain 07/20/2016  . Acute coronary syndrome ([place]) 07/19/2016  . Abnormal stress test 07/19/2016  . Unstable angina ([place]) 07/19/2016  . Sepsis ([place]) 03/30/2016  . HCAP (healthcare-associated pneumonia) 03/30/2016  . Hypokalemia 01/08/2016  . History of DVT (deep vein thrombosis) 01/08/2016  . OA (osteoarthritis) of hip 09/24/2015  . Community  acquired pneumonia 04/14/2015  . Vomiting and diarrhea   . Solitary pulmonary nodule 05/18/2014  . Nausea and vomiting 05/16/2014  . Bronchitis, acute, with bronchospasm 02/24/2014  . Osteoporosis, unspecified 08/06/2012  . Hip pain 08/06/2012  . Opiate abuse, continuous ([place]) 01/05/2012  . Opiate dependence ([place]) 01/03/2012  . Chronic respiratory failure ([place]) 01/03/2012  . COPD exacerbation ([place]) 01/03/2012  . Pulmonary infection 01/03/2012  . Pneumonia 07/19/2011  . Fever 07/18/2011  . Myalgia 07/18/2011  . Leukocytosis 07/18/2011  . COPD (chronic obstructive pulmonary disease) ([place]) 07/18/2011  . Acute-on-chronic respiratory failure ([place]) 07/18/2011  . Tachycardia 07/18/2011  . Dehydration 07/18/2011  . Chronic pain 07/18/2011  . Tobacco abuse 07/18/2011    Past Surgical History:  Procedure Laterality Date  . BACK SURGERY    . EYE SURGERY     left eye cornea and lens replaced- blind in left eye  . JOINT REPLACEMENT    . LEFT HEART CATH AND CORONARY ANGIOGRAPHY N/A 07/20/2016   Procedure: Left Heart Cath and Coronary Angiography;  Surgeon: Lorretta Harp, MD;  Location: [facility];  Service: Cardiovascular;  Laterality: N/A;  . LUNG SURGERY    . TOTAL HIP ARTHROPLASTY Right 09/24/2015   Procedure: TOTAL HIP ARTHROPLASTY ANTERIOR APPROACH;  Surgeon: Gaynelle Arabian, MD;  Location: [facility];  Service: Orthopedics;  Laterality: Right;       Family History  Problem Relation Age of Onset  . Heart failure Father   .  Diabetes Father   . Diabetes Mother   . Cancer Mother        male cancer    Social History   Tobacco Use  . Smoking status: Current Every Day Smoker    Packs/day: 1.00    Types: E-cigarettes    Last attempt to quit: 07/05/2011    Years since quitting: 7.9  . Smokeless tobacco: Never Used  . Tobacco comment: using patches per pt  Substance Use Topics  . Alcohol use: No  . Drug use: No    Types: Oxycodone, Morphine    Comment: takes these  medications as prescribed by Physician for pain!    Home Medications Prior to Admission medications   Medication Sig Start Date End Date Taking? Authorizing Provider  acetaminophen (TYLENOL) 325 MG tablet Take 2 tablets by mouth every 4 (four) hours.    [provider]  ADVAIR DISKUS 250-50 MCG/DOSE AEPB Inhale 1 puff into the lungs 2 (two) times daily. 06/14/15   [provider]  albuterol (PROVENTIL HFA;VENTOLIN HFA) 108 (90 Base) MCG/ACT inhaler Inhale 2 puffs into the lungs every 4 (four) hours as needed for wheezing or shortness of breath. Shortness of breath 01/11/16   Isaac Bliss, Rayford Halsted, MD  albuterol (PROVENTIL) (2.5 MG/3ML) 0.083% nebulizer solution Take 3 mLs (2.5 mg total) by nebulization every 6 (six) hours as needed for wheezing or shortness of breath. Patient taking differently: Take 2.5 mg by nebulization every 4 (four) hours as needed for wheezing or shortness of breath.  02/27/14   Kathie Dike, MD  aspirin 81 MG chewable tablet Chew 1 tablet (81 mg total) by mouth daily. 07/20/16   Cheryln Manly, NP  budesonide-formoterol (SYMBICORT) 160-4.5 MCG/ACT inhaler Inhale 3 puffs into the lungs 4 (four) times daily.    [provider]  cephALEXin (KEFLEX) 500 MG capsule Take 1 capsule (500 mg total) by mouth every 6 (six) hours. 06/17/19   Orson Eva, MD  furosemide (LASIX) 40 MG tablet Take 40 mg by mouth daily.    [provider]  INCRUSE ELLIPTA 62.5 MCG/INH AEPB Inhale 1 puff into the lungs daily. 12/06/18   [provider]  loperamide (IMODIUM) 2 MG capsule Take 1 capsule by mouth 4 (four) times daily.    [provider]  montelukast (SINGULAIR) 10 MG tablet Take 1 tablet by mouth daily. 07/07/16   [provider]  morphine (MS CONTIN) 30 MG 12 hr tablet Take 30 mg by mouth every 8 (eight) hours. 12/30/18   [provider]  mupirocin ointment (BACTROBAN) 2 % Place 1 application into the nose daily.  12/03/18   [provider]  naloxone HCl (NARCAN) 4 MG/0.1ML LIQD Place 4 mg into the nose as directed. To prevent overdose    [provider]  ondansetron (ZOFRAN-ODT) 4 MG disintegrating tablet Take 1 tablet by mouth every 8 (eight) hours as needed. 09/05/18   [provider]  oxycodone (ROXICODONE) 30 MG immediate release tablet Take 30 mg by mouth every 4 (four) hours as needed for pain. 06/12/19   [provider]  Polyethyl Glycol-Propyl Glycol (SYSTANE) 0.4-0.3 % SOLN Place 1-2 drops into the left eye daily as needed (for dry eye relief).     [provider]  predniSONE (DELTASONE) 10 MG tablet Take 6 tablets (60 mg total) by mouth daily with breakfast. And decrease by 1 tablet every 2 days 06/18/19   Tat, Shanon Brow, MD  pregabalin (LYRICA) 150 MG capsule Take 1 capsule  by mouth 3 (three) times daily. 12/30/18   [provider]  tiotropium (SPIRIVA) 18 MCG inhalation capsule Place 1 capsule (18 mcg total) into inhaler and inhale daily. 07/24/11   Doree Albee, MD    Allergies    Ambien [zolpidem tartrate], Melatonin, and Zolpidem  Review of Systems   Review of Systems  Constitutional: Negative for chills and fever.  HENT: Negative for congestion.   Eyes: Negative for visual disturbance.  Respiratory: Positive for cough and shortness of breath.   Cardiovascular: Negative for chest pain.  Gastrointestinal: Negative for abdominal pain and vomiting.  Genitourinary: Negative for dysuria and flank pain.  Musculoskeletal: Negative for back pain, neck pain and neck stiffness.  Skin: Negative for rash.  Neurological: Negative for light-headedness and headaches.    Physical Exam Updated Vital Signs BP 121/77   Pulse (!) 128   Temp 98 F (36.7 C) (Oral)   Resp 20   Ht 6\' 1"  (1.854 m)   Wt 82.1 kg   SpO2 99%   BMI 23.88 kg/m   Physical Exam Vitals and nursing note reviewed.  Constitutional:      Appearance: He is ill-appearing.    HENT:     Head: Normocephalic and atraumatic.  Eyes:     General:        Right eye: No discharge.        Left eye: No discharge.     Conjunctiva/sclera: Conjunctivae normal.  Neck:     Trachea: No tracheal deviation.  Cardiovascular:     Rate and Rhythm: Regular rhythm. Tachycardia present.  Pulmonary:     Effort: Tachypnea present.     Breath sounds: Examination of the right-middle field reveals wheezing. Examination of the left-middle field reveals wheezing. Examination of the right-lower field reveals wheezing. Examination of the left-lower field reveals wheezing. Wheezing present.  Abdominal:     General: There is no distension.     Palpations: Abdomen is soft.     Tenderness: There is no abdominal tenderness. There is no guarding.  Musculoskeletal:     Cervical back: Normal range of motion and neck supple.     Right lower leg: No edema.     Left lower leg: No edema.  Skin:    General: Skin is warm.     Findings: No rash.  Neurological:     Mental Status: He is alert and oriented to person, place, and time.  Psychiatric:        Behavior: Behavior is not agitated.     ED Results / Procedures / Treatments   Labs (all labs ordered are listed, but only abnormal results are displayed) Labs Reviewed  BASIC METABOLIC PANEL - Abnormal; Notable for the following components:      Result Value   Chloride 95 (*)    Glucose, Bld 144 (*)    All other components within normal limits  CBC - Abnormal; Notable for the following components:   WBC 16.3 (*)    All other components within normal limits  RESPIRATORY PANEL BY RT PCR (FLU A&B, COVID)  BRAIN NATRIURETIC PEPTIDE    EKG None  Radiology DG Chest Portable 1 View  Result Date: 07/01/2019 CLINICAL DATA:  Shortness of breath. Hypoxia. EXAM: PORTABLE CHEST 1 VIEW COMPARISON:  CT chest 06/15/2019. One-view chest x-ray 06/13/2019. FINDINGS: Heart size is normal. Aortic atherosclerosis is again seen. Changes of severe COPD are  evident. Scarring the left lung is stable. No acute superimposed disease is present. IMPRESSION: 1.  Severe COPD. 2. No acute cardiopulmonary disease or significant interval change. Electronically Signed   By: San Morelle M.D.   On: 07/01/2019 11:44    Procedures .Critical Care Performed by: Elnora Morrison, MD Authorized by: Elnora Morrison, MD   Critical care provider statement:    Critical care time (minutes):  35   Critical care start time:  07/01/2019 11:10 AM   Critical care end time:  07/01/2019 11:45 AM   Critical care time was exclusive of:  Separately billable procedures and treating other patients and teaching time   Critical care was necessary to treat or prevent imminent or life-threatening deterioration of the following conditions:  Respiratory failure   Critical care was time spent personally by me on the following activities:  Evaluation of patient's response to treatment, examination of patient, ordering and performing treatments and interventions, ordering and review of laboratory studies, ordering and review of radiographic studies, pulse oximetry, re-evaluation of patient's condition, obtaining history from patient or surrogate and review of old charts   (including critical care time)  Medications Ordered in ED Medications  albuterol (PROVENTIL,VENTOLIN) solution continuous neb (15 mg Nebulization New Bag/Given 07/01/19 1135)  furosemide (LASIX) tablet 40 mg (has no administration in time range)  montelukast (SINGULAIR) tablet 10 mg (has no administration in time range)  morphine (MS CONTIN) 12 hr tablet 30 mg (30 mg Oral Given 07/01/19 1359)  oxyCODONE (Oxy IR/ROXICODONE) immediate release tablet 30 mg (has no administration in time range)  pregabalin (LYRICA) capsule 150 mg (has no administration in time range)  budesonide (PULMICORT) nebulizer solution 0.25 mg (0.25 mg Nebulization Not Given 07/01/19 1422)  ipratropium-albuterol (DUONEB) 0.5-2.5 (3) MG/3ML nebulizer  solution 3 mL (3 mLs Nebulization Given 07/01/19 1451)  doxycycline (VIBRA-TABS) tablet 100 mg (has no administration in time range)  acetylcysteine (MUCOMYST) 20 % nebulizer / oral solution 2 mL (2 mLs Nebulization Not Given 07/01/19 1421)  methylPREDNISolone sodium succinate (SOLU-MEDROL) 125 mg/2 mL injection 60 mg (has no administration in time range)  aspirin chewable tablet 81 mg (has no administration in time range)  acetaminophen (TYLENOL) tablet 650 mg (has no administration in time range)  polyvinyl alcohol (LIQUIFILM TEARS) 1.4 % ophthalmic solution 1-2 drop (has no administration in time range)  polyethylene glycol (MIRALAX / GLYCOLAX) packet 17 g (has no administration in time range)  enoxaparin (LOVENOX) injection 40 mg (has no administration in time range)  sodium chloride flush (NS) 0.9 % injection 3 mL (has no administration in time range)  sodium chloride flush (NS) 0.9 % injection 3 mL (has no administration in time range)  0.9 %  sodium chloride infusion (has no administration in time range)  ondansetron (ZOFRAN) tablet 4 mg (has no administration in time range)    Or  ondansetron (ZOFRAN) injection 4 mg (has no administration in time range)  methylPREDNISolone sodium succinate (SOLU-MEDROL) 125 mg/2 mL injection 125 mg (125 mg Intravenous Given 07/01/19 1122)  ipratropium (ATROVENT) nebulizer solution 0.5 mg (0.5 mg Nebulization Given 07/01/19 1135)  sodium chloride 0.9 % bolus 500 mL (500 mLs Intravenous New Bag/Given 07/01/19 1226)  oxyCODONE-acetaminophen (PERCOCET/ROXICET) 5-325 MG per tablet 2 tablet (2 tablets Oral Given 07/01/19 1315)    ED Course  I have reviewed the triage vital signs and the nursing notes.  Pertinent labs & imaging results that were available during my care of the patient were reviewed by me and considered in my medical decision making (see chart for details).    MDM Rules/Calculators/A&P  Patient with significant lung  disease history presents with worsening dyspnea.  Patient is tachypneic, tachycardic, wheezing diffuse and decreased air movement.  Concern clinically for acute COPD exacerbation and possible pneumonia.  Plan for Covid testing, continuous nebulizer, steroids, reassessment and admission to the hospital.  EKG reviewed sinus tachycardia. Plan for fluids, tylenol for rib pain.   Chest x-ray reviewed similar to previous, COPD type pattern.  Blood work reviewed white blood cell count 16,000, normal creatinine.  Normal hemoglobin.  Patient's heart rate improved in the ER.  Discussed with hospitalist who agreed with admission and evaluated the patient in the emergency room.  EDDI TATAR was evaluated in Emergency Department on 07/01/2019 for the symptoms described in the history of present illness. He was evaluated in the context of the global COVID-19 pandemic, which necessitated consideration that the patient might be at risk for infection with the SARS-CoV-2 virus that causes COVID-19. Institutional protocols and algorithms that pertain to the evaluation of patients at risk for COVID-19 are in a state of rapid change based on information released by regulatory bodies including the CDC and federal and state organizations. These policies and algorithms were followed during the patient's care in the ED.   Final Clinical Impression(s) / ED Diagnoses Final diagnoses:  Acute exacerbation of chronic obstructive pulmonary disease (COPD) ([place])  Hypoxia    Rx / DC Orders ED Discharge Orders    None       Elnora Morrison, MD 07/01/19 1459

## 2019-07-01 NOTE — Consult Note (Signed)
Name: Wesley [name] MRN: [id number] DOB: 11/23/1965    ADMISSION DATE:  07/01/2019 CONSULTATION DATE:  07/01/2019   REFERRING MD :  Manuella Ghazi, triad MD  CHIEF COMPLAINT:  resp distress, AECOPD  BRIEF PATIENT DESCRIPTION: 54 year old with bullous emphysema and chronic hypoxic respiratory failure with recurrent admission for COPD exacerbation  SIGNIFICANT EVENTS  4/13 discharged after admission for a COPD  STUDIES:   A1AT 01/14/19 >> 165  PFT 11/20/12 >> FEV1 0.86 (21%), FEV1% 30, TLC 10.41 (145%), DLCO 42%  CT chest 06/15/19 >> severe emphysema with bullous changes at apices, prior wedge resection from Rt and Lt upper lobes, lung hernia on Lt  Echo 06/16/19 >> EF 65 to 70%    HISTORY OF PRESENT ILLNESS: 54 year old, quit smoking 4 years ago, with severe bullous emphysema and chronic hypoxic respiratory failure on 3 L oxygen.  He underwent bullectomy on the right in 2014 and on the left in 2015.  This was complicated by subcutaneous emphysema and persistent bronchopleural fistula and he has a residual left lung hernia .  He was recently hospitalized 4/9-4/13 for a COPD and treated with IV Solu-Medrol He also has a history of asbestos exposure and worked as a Engineer, building services.  He has chronic pain and is on MS Contin and Lyrica  He was treated in early April with doxycycline and prednisone and failed outpatient treatment.  He states that after discharge he developed sinus congestion and this converted to a chest cold and he was bringing up yellow-green sputum  Covid and flu testing was negative, in the ED received albuterol nebs and had persistent tachycardia, BNP level 19 His maintenance medications are Symbicort and Spiriva, he has an upcoming appointment on 5/10  PMH- s/p Rt VATS bullectomy 01/16/13, s/p Lt VATS bullectomy 08/28/13, Asbestos exposure, Lt lung hernia PAST MEDICAL HISTORY :   has a past medical history of Asbestos exposure, Avascular necrosis of femur ([place]), Back pain,  Bullous emphysema ([place]), COPD (chronic obstructive pulmonary disease) ([place]), Neuropathy, On home oxygen therapy, Osteoporosis, and Tumor of lung.  has a past surgical history that includes Joint replacement; Back surgery; Lung surgery; Eye surgery; Total hip arthroplasty (Right, 09/24/2015); and LEFT HEART CATH AND CORONARY ANGIOGRAPHY (N/A, 07/20/2016).    Prior to Admission medications   Medication Sig Start Date End Date Taking? Authorizing Provider  acetaminophen (TYLENOL) 325 MG tablet Take 2 tablets by mouth every 4 (four) hours.    [provider]  ADVAIR DISKUS 250-50 MCG/DOSE AEPB Inhale 1 puff into the lungs 2 (two) times daily. 06/14/15   [provider]  albuterol (PROVENTIL HFA;VENTOLIN HFA) 108 (90 Base) MCG/ACT inhaler Inhale 2 puffs into the lungs every 4 (four) hours as needed for wheezing or shortness of breath. Shortness of breath 01/11/16   Isaac Bliss, Rayford Halsted, MD  albuterol (PROVENTIL) (2.5 MG/3ML) 0.083% nebulizer solution Take 3 mLs (2.5 mg total) by nebulization every 6 (six) hours as needed for wheezing or shortness of breath. Patient taking differently: Take 2.5 mg by nebulization every 4 (four) hours as needed for wheezing or shortness of breath.  02/27/14   Kathie Dike, MD  aspirin 81 MG chewable tablet Chew 1 tablet (81 mg total) by mouth daily. 07/20/16   Cheryln Manly, NP  budesonide-formoterol (SYMBICORT) 160-4.5 MCG/ACT inhaler Inhale 3 puffs into the lungs 4 (four) times daily.    [provider]  cephALEXin (KEFLEX) 500 MG capsule Take 1 capsule (500 mg total) by mouth every 6 (six) hours.  06/17/19   Orson Eva, MD  furosemide (LASIX) 40 MG tablet Take 40 mg by mouth daily.    [provider]  INCRUSE ELLIPTA 62.5 MCG/INH AEPB Inhale 1 puff into the lungs daily. 12/06/18   [provider]  loperamide (IMODIUM) 2 MG capsule Take 1 capsule by mouth 4 (four) times daily.    [provider]  montelukast  (SINGULAIR) 10 MG tablet Take 1 tablet by mouth daily. 07/07/16   [provider]  morphine (MS CONTIN) 30 MG 12 hr tablet Take 30 mg by mouth every 8 (eight) hours. 12/30/18   [provider]  mupirocin ointment (BACTROBAN) 2 % Place 1 application into the nose daily. 12/03/18   [provider]  naloxone HCl (NARCAN) 4 MG/0.1ML LIQD Place 4 mg into the nose as directed. To prevent overdose    [provider]  ondansetron (ZOFRAN-ODT) 4 MG disintegrating tablet Take 1 tablet by mouth every 8 (eight) hours as needed. 09/05/18   [provider]  oxycodone (ROXICODONE) 30 MG immediate release tablet Take 30 mg by mouth every 4 (four) hours as needed for pain. 06/12/19   [provider]  Polyethyl Glycol-Propyl Glycol (SYSTANE) 0.4-0.3 % SOLN Place 1-2 drops into the left eye daily as needed (for dry eye relief).     [provider]  predniSONE (DELTASONE) 10 MG tablet Take 6 tablets (60 mg total) by mouth daily with breakfast. And decrease by 1 tablet every 2 days 06/18/19   Tat, Shanon Brow, MD  pregabalin (LYRICA) 150 MG capsule Take 1 capsule by mouth 3 (three) times daily. 12/30/18   [provider]  tiotropium (SPIRIVA) 18 MCG inhalation capsule Place 1 capsule (18 mcg total) into inhaler and inhale daily. 07/24/11   Doree Albee, MD   Allergies  Allergen Reactions  . Ambien [Zolpidem Tartrate] Other (See Comments)    hallucinations  . Melatonin Itching  . Zolpidem Other (See Comments)    Altered mental status    FAMILY HISTORY:  family history includes Cancer in his mother; Diabetes in his father and mother; Heart failure in his father. SOCIAL HISTORY:  reports that he has been smoking e-cigarettes. He has been smoking about 1.00 pack per day. He has never used smokeless tobacco. He reports that he does not drink alcohol or use drugs.  REVIEW OF SYSTEMS:   Positive for shortness of breath, wheezing, yellow sputum Negative for  pedal edema, abdominal distention, has chronic constipation, no nausea vomiting Negative for chest pain, orthopnea paroxysmal nocturnal dyspnea Negative for dizziness increased somnolence  SUBJECTIVE:   VITAL SIGNS: Temp:  [98 F (36.7 C)] 98 F (36.7 C) (04/27 1053) Pulse Rate:  [114-128] 114 (04/27 1530) Resp:  [13-21] 21 (04/27 1530) BP: (100-144)/(46-85) 101/74 (04/27 1530) SpO2:  [83 %-100 %] 100 % (04/27 1530) FiO2 (%):  [40 %] 40 % (04/27 1530) Weight:  [82.1 kg] 82.1 kg (04/27 1053)  PHYSICAL EXAMINATION: General: Elderly man, mild distress but able to speak in full sentences, well-nourished Neuro: Alert, interactive, nonfocal, mild tremors HEENT: No JVD, mild pallor, no icterus Cardiovascular: S1-S2 tacky, sinus on monitor Lungs: Decreased breath sounds bilateral, faint expiratory rhonchi , hernia on left lateral chest wall with coughing Abdomen: Soft, nontender Musculoskeletal: No edema, no deformity Skin: Ecchymosis over both arms and legs  Recent Labs  Lab 07/01/19 1103  NA 137  K 3.8  CL 95*  CO2 32  BUN 17  CREATININE 0.83  GLUCOSE 144*  Recent Labs  Lab 07/01/19 1103  HGB 15.1  HCT 49.4  WBC 16.3*  PLT 275   DG Chest Portable 1 View  Result Date: 07/01/2019 CLINICAL DATA:  Shortness of breath. Hypoxia. EXAM: PORTABLE CHEST 1 VIEW COMPARISON:  CT chest 06/15/2019. One-view chest x-ray 06/13/2019. FINDINGS: Heart size is normal. Aortic atherosclerosis is again seen. Changes of severe COPD are evident. Scarring the left lung is stable. No acute superimposed disease is present. IMPRESSION: 1. Severe COPD. 2. No acute cardiopulmonary disease or significant interval change. Electronically Signed   By: San Morelle M.D.   On: 07/01/2019 11:44    ASSESSMENT / PLAN:  Acute exacerbation of COPD Bullous emphysema Chronic hypoxic respiratory failure Chronic pain syndrome on MS Contin  -This appears to be triggered by sinus infection, so presumed  infective exacerbation Chest x-ray personally reviewed which shows hyperinflation but no clear infiltrate He denies seasonal allergies, head CT 4/10 does not show any evidence of sinus collection -ABG 01/2019 7.29/65 suggesting chronic hypercarbia  Recommend -Agree with IV Solu-Medrol 60 every 6 hours -Pulmicort Brovana nebs while inpatient -Mucomyst has been started on this can be mixed with hypertonic saline nebs to facilitate sputum expectoration, check sputum culture since he has received multiple antibiotics -I encouraged him to get Covid vaccination Outpatient pulmonary office appointment with dr Halford Chessman has  been made for 5/10    Kara Mead MD. FCCP. [facility]  If no response to pager , please call [phone]    07/01/2019, 3:40 PM

## 2019-07-02 DIAGNOSIS — J441 Chronic obstructive pulmonary disease with (acute) exacerbation: Secondary | ICD-10-CM | POA: Diagnosis not present

## 2019-07-02 DIAGNOSIS — J9611 Chronic respiratory failure with hypoxia: Secondary | ICD-10-CM | POA: Diagnosis not present

## 2019-07-02 DIAGNOSIS — J9612 Chronic respiratory failure with hypercapnia: Secondary | ICD-10-CM | POA: Diagnosis not present

## 2019-07-02 LAB — GLUCOSE, CAPILLARY
Glucose-Capillary: 136 mg/dL — ABNORMAL HIGH (ref 70–99)
Glucose-Capillary: 147 mg/dL — ABNORMAL HIGH (ref 70–99)
Glucose-Capillary: 158 mg/dL — ABNORMAL HIGH (ref 70–99)
Glucose-Capillary: 211 mg/dL — ABNORMAL HIGH (ref 70–99)
Glucose-Capillary: 221 mg/dL — ABNORMAL HIGH (ref 70–99)
Glucose-Capillary: 251 mg/dL — ABNORMAL HIGH (ref 70–99)

## 2019-07-02 LAB — BASIC METABOLIC PANEL
Anion gap: 12 (ref 5–15)
BUN: 17 mg/dL (ref 6–20)
CO2: 27 mmol/L (ref 22–32)
Calcium: 9 mg/dL (ref 8.9–10.3)
Chloride: 97 mmol/L — ABNORMAL LOW (ref 98–111)
Creatinine, Ser: 0.83 mg/dL (ref 0.61–1.24)
GFR calc Af Amer: >60 mL/min (ref >60)
GFR calc non Af Amer: >60 mL/min (ref >60)
Glucose, Bld: 271 mg/dL — ABNORMAL HIGH (ref 70–99)
Potassium: 4.2 mmol/L (ref 3.5–5.1)
Sodium: 136 mmol/L (ref 135–145)

## 2019-07-02 LAB — CBC
HCT: 40.5 % (ref 39.0–52.0)
Hemoglobin: 12.2 g/dL — ABNORMAL LOW (ref 13.0–17.0)
MCH: 30.2 pg (ref 26.0–34.0)
MCHC: 30.1 g/dL (ref 30.0–36.0)
MCV: 100.2 fL — ABNORMAL HIGH (ref 80.0–100.0)
Platelets: 242 10*3/uL (ref 150–400)
RBC: 4.04 MIL/uL — ABNORMAL LOW (ref 4.22–5.81)
RDW: 12.5 % (ref 11.5–15.5)
WBC: 11.8 10*3/uL — ABNORMAL HIGH (ref 4.0–10.5)
nRBC: 0 % (ref 0.0–0.2)

## 2019-07-02 LAB — MAGNESIUM: Magnesium: 2.1 mg/dL (ref 1.7–2.4)

## 2019-07-02 MED ORDER — METHYLPREDNISOLONE SODIUM SUCC 125 MG IJ SOLR
60.0000 mg | Freq: Three times a day (TID) | INTRAMUSCULAR | Status: DC
  Administered 2019-07-02 – 2019-07-03 (×3): 60 mg via INTRAVENOUS
  Filled 2019-07-02 (×3): qty 2

## 2019-07-02 MED ORDER — CHLORHEXIDINE GLUCONATE CLOTH 2 % EX PADS
6.0000 | MEDICATED_PAD | Freq: Every day | CUTANEOUS | Status: DC
  Administered 2019-07-02 – 2019-07-03 (×2): 6 via TOPICAL

## 2019-07-02 MED ORDER — KETOROLAC TROMETHAMINE 30 MG/ML IJ SOLN
30.0000 mg | Freq: Three times a day (TID) | INTRAMUSCULAR | Status: DC | PRN
  Administered 2019-07-02 – 2019-07-06 (×10): 30 mg via INTRAVENOUS
  Filled 2019-07-02 (×11): qty 1

## 2019-07-02 MED ORDER — ENOXAPARIN SODIUM 40 MG/0.4ML ~~LOC~~ SOLN
40.0000 mg | SUBCUTANEOUS | Status: DC
  Administered 2019-07-02 – 2019-07-06 (×5): 40 mg via SUBCUTANEOUS
  Filled 2019-07-02 (×5): qty 0.4

## 2019-07-02 MED ORDER — HYDROMORPHONE HCL 1 MG/ML IJ SOLN
1.0000 mg | Freq: Once | INTRAMUSCULAR | Status: AC
  Administered 2019-07-02: 1 mg via INTRAVENOUS
  Filled 2019-07-02: qty 1

## 2019-07-02 MED ORDER — ACETAMINOPHEN 325 MG PO TABS
650.0000 mg | ORAL_TABLET | Freq: Four times a day (QID) | ORAL | Status: DC | PRN
  Administered 2019-07-03: 650 mg via ORAL
  Filled 2019-07-02: qty 2

## 2019-07-02 MED ORDER — NICOTINE 21 MG/24HR TD PT24
21.0000 mg | MEDICATED_PATCH | Freq: Every day | TRANSDERMAL | Status: DC
  Administered 2019-07-02 – 2019-07-06 (×5): 21 mg via TRANSDERMAL
  Filled 2019-07-02 (×5): qty 1

## 2019-07-02 MED ORDER — GUAIFENESIN-DM 100-10 MG/5ML PO SYRP
5.0000 mL | ORAL_SOLUTION | ORAL | Status: DC | PRN
  Administered 2019-07-02 – 2019-07-06 (×7): 5 mL via ORAL
  Filled 2019-07-02 (×7): qty 5

## 2019-07-02 NOTE — Progress Notes (Signed)
   Name: Wesley [name] MRN: [id number] DOB: May 17, 1965    ADMISSION DATE:  07/01/2019 CONSULTATION DATE:  07/02/2019   REFERRING MD :  Manuella Ghazi, [facility]  CHIEF COMPLAINT:  resp distress, AECOPD  BRIEF PATIENT DESCRIPTION: 54 year old with bullous emphysema and chronic hypoxic respiratory failure on 3 L O2 with recurrent admission for COPD exacerbation  PMH- s/p Rt VATS bullectomy 01/16/13, s/p Lt VATS bullectomy 08/28/13, Asbestos exposure, Lt lung hernia , opiate use disorder   SIGNIFICANT EVENTS  4/13 discharged after admission for a COPD  STUDIES:   A1AT 01/14/19 >> 165  PFT 11/20/12 >> FEV1 0.86 (21%), FEV1% 30, TLC 10.41 (145%), DLCO 42%  CT chest 06/15/19 >> severe emphysema with bullous changes at apices, prior wedge resection from Rt and Lt upper lobes, lung hernia on Lt  Echo 06/16/19 >> EF 65 to 70%     SUBJECTIVE: Feels much improved Able to sit up Complains of increased back pain with coughing and requesting more pain medicine  VITAL SIGNS: Temp:  [97 F (36.1 C)-98.5 F (36.9 C)] 98.3 F (36.8 C) (04/28 1348) Pulse Rate:  [99-129] 109 (04/28 1348) Resp:  [14-23] 20 (04/28 1348) BP: (94-130)/(41-104) 120/70 (04/28 1348) SpO2:  [94 %-100 %] 96 % (04/28 1430)  PHYSICAL EXAMINATION: General: Elderly man, sitting up in bed, no distress Neuro: Alert, interactive, nonfocal, mild tremors HEENT: No JVD, mild pallor, no icterus Cardiovascular: S1-S2 regular, sinus on monitor Lungs: Decreased breath sounds bilateral, faint expiratory rhonchi , hernia on left lateral chest wall with coughing Abdomen: Soft, nontender Musculoskeletal: No edema, no deformity Skin: Ecchymosis over both arms and legs  Recent Labs  Lab 07/01/19 1103 07/02/19 0402  NA 137 136  K 3.8 4.2  CL 95* 97*  CO2 32 27  BUN 17 17  CREATININE 0.83 0.83  GLUCOSE 144* 271*   Recent Labs  Lab 07/01/19 1103 07/02/19 0402  HGB 15.1 12.2*  HCT 49.4 40.5  WBC 16.3* 11.8*  PLT 275 242    DG Chest Portable 1 View  Result Date: 07/01/2019 CLINICAL DATA:  Shortness of breath. Hypoxia. EXAM: PORTABLE CHEST 1 VIEW COMPARISON:  CT chest 06/15/2019. One-view chest x-ray 06/13/2019. FINDINGS: Heart size is normal. Aortic atherosclerosis is again seen. Changes of severe COPD are evident. Scarring the left lung is stable. No acute superimposed disease is present. IMPRESSION: 1. Severe COPD. 2. No acute cardiopulmonary disease or significant interval change. Electronically Signed   By: San Morelle M.D.   On: 07/01/2019 11:44    ASSESSMENT / PLAN:  Acute exacerbation of COPD Bullous emphysema Chronic hypoxic respiratory failure   -This appears to be triggered by sinus infection, so presumed infective exacerbation He denies seasonal allergies, head CT 4/10 does not show any evidence of sinus collection -ABG 01/2019 7.29/65 suggesting chronic hypercarbia  Recommend - IV Solu-Medrol 60 every 8 hours -Pulmicort Brovana nebs while inpatient -Mucomyst has been started on this can be mixed with hypertonic saline nebs to facilitate sputum expectoration -Await sputum culture, continue ceftriaxone for 3 days and then can complete the course with doxycycline   Acute on chronic pain syndrome on MS Contin -use IV Toradol for breakthrough musculoskeletal pain  Outpatient pulmonary office appointment with dr Halford Chessman has  been made for 5/10  Kara Mead MD. [facility]  If no response to pager , please call [phone]    07/02/2019, 4:07 PM

## 2019-07-02 NOTE — Progress Notes (Signed)
PROGRESS NOTE    Wesley [name]  [id number] DOB: 07/24/1965 DOA: 07/01/2019 PCP: [name], MD    Brief Narrative:  54 year old male with a history of COPD chronic hypoxic respiratory failure on home oxygen, admitted to the hospital with shortness of breath, cough and wheezing.  He was found to have COPD exacerbation and admitted for IV steroids/antibiotics/bronchodilator therapy.   Assessment & Plan:   Active Problems:   Acute-on-chronic respiratory failure (HCC)   Opiate dependence (HCC)   Acute on chronic respiratory failure with hypoxia (HCC)   Acute exacerbation of chronic obstructive pulmonary disease (COPD) ([place])   1. Acute on chronic respiratory failure with hypoxia.  Secondary to COPD exacerbation.  Started on intravenous steroids.  He is on antibiotics.  COVID-19 is negative.  Pulmonology following, appreciate input.  He has had mild improvement, although is not back to baseline.  At baseline he requires 3 to 5 L of oxygen.  Continue to titrate steroids.  Continue on Mucomyst, duo nebs and inhaled steroids. 2. Chronic back pain/chronic pain syndrome.  Continue on home dose of opiates.  Use Toradol as needed. 3. History of asbestosis.  Continue to follow-up with pulmonology. 4. Tobacco use.  Continue on nicotine patch.   DVT prophylaxis: Lovenox Code Status: Full code Family Communication: Discussed with patient Disposition Plan: Status is: Inpatient  Remains inpatient appropriate because:IV treatments appropriate due to intensity of illness or inability to take PO.  Patient has continued shortness of breath and wheezing.  Patient needs continued inpatient stay for IV steroids and bronchodilator therapy.    Dispo: The patient is from: Home              Anticipated d/c is to: Home              Anticipated d/c date is: 2 days              Patient currently is not medically stable to d/c.    Consultants:   Pulmonology  Procedures:     Antimicrobials:    Ceftriaxone 4/27 >  Doxycycline 4/27 >   Subjective: Feels that his breathing has mildly improved, although not back to baseline.  Continues to have wheezing and cough.  Objective: Vitals:   07/02/19 1430 07/02/19 1800 07/02/19 1954 07/02/19 2055  BP:  (!) 144/126  104/69  Pulse:  (!) 103  100  Resp:  19  16  Temp:    98.3 F (36.8 C)  TempSrc:  Oral  Oral  SpO2: 96% 98% 98% 98%  Weight:      Height:        Intake/Output Summary (Last 24 hours) at 07/02/2019 2059 Last data filed at 07/02/2019 1100 Gross per 24 hour  Intake 3 ml  Output 1125 ml  Net -1122 ml   Filed Weights   07/01/19 1053  Weight: 82.1 kg    Examination:  General exam: Appears calm and comfortable  Respiratory system: Bilateral wheezing. Respiratory effort normal. Cardiovascular system: S1 & S2 heard, RRR. No JVD, murmurs, rubs, gallops or clicks. No pedal edema. Gastrointestinal system: Abdomen is nondistended, soft and nontender. No organomegaly or masses felt. Normal bowel sounds heard. Central nervous system: Alert and oriented. No focal neurological deficits. Extremities: Symmetric 5 x 5 power. Skin: No rashes, lesions or ulcers Psychiatry: Judgement and insight appear normal. Mood & affect appropriate.     Data Reviewed: I have personally reviewed following labs and imaging studies  CBC: Recent Labs  Lab 07/01/19  1103 07/02/19 0402  WBC 16.3* 11.8*  HGB 15.1 12.2*  HCT 49.4 40.5  MCV 99.8 100.2*  PLT 275 [id number]   Basic Metabolic Panel: Recent Labs  Lab 07/01/19 1103 07/02/19 0402  NA 137 136  K 3.8 4.2  CL 95* 97*  CO2 32 27  GLUCOSE 144* 271*  BUN 17 17  CREATININE 0.83 0.83  CALCIUM 9.2 9.0  MG  --  2.1   GFR: Estimated Creatinine Clearance: 112.3 mL/min (by C-G formula based on SCr of 0.83 mg/dL). Liver Function Tests: No results for input(s): AST, ALT, ALKPHOS, BILITOT, PROT, ALBUMIN in the last 168 hours. No results for input(s): LIPASE, AMYLASE in the last 168  hours. No results for input(s): AMMONIA in the last 168 hours. Coagulation Profile: No results for input(s): INR, PROTIME in the last 168 hours. Cardiac Enzymes: No results for input(s): CKTOTAL, CKMB, CKMBINDEX, TROPONINI in the last 168 hours. BNP (last 3 results) No results for input(s): PROBNP in the last 8760 hours. HbA1C: No results for input(s): HGBA1C in the last 72 hours. CBG: Recent Labs  Lab 07/02/19 0146 07/02/19 0751 07/02/19 1115 07/02/19 1618 07/02/19 2056  GLUCAP 251* 158* 211* 147* 136*   Lipid Profile: No results for input(s): CHOL, HDL, LDLCALC, TRIG, CHOLHDL, LDLDIRECT in the last 72 hours. Thyroid Function Tests: No results for input(s): TSH, T4TOTAL, FREET4, T3FREE, THYROIDAB in the last 72 hours. Anemia Panel: No results for input(s): VITAMINB12, FOLATE, FERRITIN, TIBC, IRON, RETICCTPCT in the last 72 hours. Sepsis Labs: No results for input(s): PROCALCITON, LATICACIDVEN in the last 168 hours.  Recent Results (from the past 240 hour(s))  Respiratory Panel by RT PCR (Flu A&B, Covid) - Nasopharyngeal Swab     Status: None   Collection Time: 07/01/19 11:25 AM   Specimen: Nasopharyngeal Swab  Result Value Ref Range Status   SARS Coronavirus 2 by RT PCR NEGATIVE NEGATIVE Final    Comment: (NOTE) SARS-CoV-2 target nucleic acids are NOT DETECTED. The SARS-CoV-2 RNA is generally detectable in upper respiratoy specimens during the acute phase of infection. The lowest concentration of SARS-CoV-2 viral copies this assay can detect is 131 copies/mL. A negative result does not preclude SARS-Cov-2 infection and should not be used as the sole basis for treatment or other patient management decisions. A negative result may occur with  improper specimen collection/handling, submission of specimen other than nasopharyngeal swab, presence of viral mutation(s) within the areas targeted by this assay, and inadequate number of viral copies (<131 copies/mL). A negative  result must be combined with clinical observations, patient history, and epidemiological information. The expected result is Negative. Fact Sheet for Patients:  PinkCheek.be Fact Sheet for Healthcare Providers:  GravelBags.it This test is not yet ap proved or cleared by the [place] FDA and  has been authorized for detection and/or diagnosis of SARS-CoV-2 by FDA under an Emergency Use Authorization (EUA). This EUA will remain  in effect (meaning this test can be used) for the duration of the COVID-19 declaration under Section 564(b)(1) of the Act, 21 U.S.C. section 360bbb-3(b)(1), unless the authorization is terminated or revoked sooner.    Influenza A by PCR NEGATIVE NEGATIVE Final   Influenza B by PCR NEGATIVE NEGATIVE Final    Comment: (NOTE) The Xpert Xpress SARS-CoV-2/FLU/RSV assay is intended as an aid in  the diagnosis of influenza from Nasopharyngeal swab specimens and  should not be used as a sole basis for treatment. Nasal washings and  aspirates are unacceptable for Xpert Xpress SARS-CoV-2/FLU/RSV  testing. Fact Sheet for Patients: PinkCheek.be Fact Sheet for Healthcare Providers: GravelBags.it This test is not yet approved or cleared by the [place] FDA and  has been authorized for detection and/or diagnosis of SARS-CoV-2 by  FDA under an Emergency Use Authorization (EUA). This EUA will remain  in effect (meaning this test can be used) for the duration of the  Covid-19 declaration under Section 564(b)(1) of the Act, 21  U.S.C. section 360bbb-3(b)(1), unless the authorization is  terminated or revoked. Performed at [facility], [address], [place], [place] [address]   MRSA PCR Screening     Status: None   Collection Time: 07/01/19  3:47 PM   Specimen: Nasal Mucosa; Nasopharyngeal  Result Value Ref Range Status   MRSA by PCR NEGATIVE  NEGATIVE Final    Comment:        The GeneXpert MRSA Assay (FDA approved for NASAL specimens only), is one component of a comprehensive MRSA colonization surveillance program. It is not intended to diagnose MRSA infection nor to guide or monitor treatment for MRSA infections. Performed at [facility], [address] [place], [place] [address]          Radiology Studies: DG Chest Portable 1 View  Result Date: 07/01/2019 CLINICAL DATA:  Shortness of breath. Hypoxia. EXAM: PORTABLE CHEST 1 VIEW COMPARISON:  CT chest 06/15/2019. One-view chest x-ray 06/13/2019. FINDINGS: Heart size is normal. Aortic atherosclerosis is again seen. Changes of severe COPD are evident. Scarring the left lung is stable. No acute superimposed disease is present. IMPRESSION: 1. Severe COPD. 2. No acute cardiopulmonary disease or significant interval change. Electronically Signed   By: San Morelle M.D.   On: 07/01/2019 11:44        Scheduled Meds: . acetylcysteine  2 mL Nebulization BID  . aspirin  81 mg Oral Daily  . budesonide (PULMICORT) nebulizer solution  0.25 mg Nebulization BID  . Chlorhexidine Gluconate Cloth  6 each Topical Daily  . doxycycline  100 mg Oral Q12H  . enoxaparin (LOVENOX) injection  40 mg Subcutaneous Q24H  . furosemide  40 mg Oral Daily  . insulin aspart  0-15 Units Subcutaneous TID WC  . insulin aspart  0-5 Units Subcutaneous QHS  . ipratropium-albuterol  3 mL Nebulization Q6H  . methylPREDNISolone (SOLU-MEDROL) injection  60 mg Intravenous Q8H  . montelukast  10 mg Oral Daily  . morphine  30 mg Oral Q8H  . nicotine  21 mg Transdermal Daily  . polyethylene glycol  17 g Oral Daily  . pregabalin  150 mg Oral TID  . sodium chloride flush  3 mL Intravenous Q12H   Continuous Infusions: . sodium chloride 250 mL (07/01/19 1653)  . albuterol    . cefTRIAXone (ROCEPHIN)  IV 1 g (07/02/19 1703)     LOS: 1 day    Time spent: 1mins   Kathie Dike, MD [facility]   If 7PM-7AM, please contact night-coverage [email or web address]  07/02/2019, 8:59 PM

## 2019-07-03 LAB — GLUCOSE, CAPILLARY
Glucose-Capillary: 121 mg/dL — ABNORMAL HIGH (ref 70–99)
Glucose-Capillary: 103 mg/dL — ABNORMAL HIGH (ref 70–99)
Glucose-Capillary: 149 mg/dL — ABNORMAL HIGH (ref 70–99)
Glucose-Capillary: 185 mg/dL — ABNORMAL HIGH (ref 70–99)

## 2019-07-03 LAB — EXPECTORATED SPUTUM ASSESSMENT W GRAM STAIN, RFLX TO RESP C

## 2019-07-03 MED ORDER — PREDNISONE 20 MG PO TABS
50.0000 mg | ORAL_TABLET | Freq: Every day | ORAL | Status: DC
  Administered 2019-07-04 – 2019-07-06 (×3): 50 mg via ORAL
  Filled 2019-07-03 (×3): qty 1

## 2019-07-03 MED ORDER — ALBUTEROL SULFATE (2.5 MG/3ML) 0.083% IN NEBU: 2.5000 mg | INHALATION_SOLUTION | Freq: Four times a day (QID) | RESPIRATORY_TRACT | Status: DC | PRN

## 2019-07-03 MED ORDER — ALBUTEROL SULFATE HFA 108 (90 BASE) MCG/ACT IN AERS
INHALATION_SPRAY | RESPIRATORY_TRACT | Status: AC
  Filled 2019-07-03: qty 6.7

## 2019-07-03 MED ORDER — HYDROCODONE-HOMATROPINE 5-1.5 MG/5ML PO SYRP
5.0000 mL | ORAL_SOLUTION | ORAL | Status: DC | PRN
  Administered 2019-07-03 – 2019-07-06 (×11): 5 mL via ORAL
  Filled 2019-07-03 (×11): qty 5

## 2019-07-03 MED ORDER — ALBUTEROL SULFATE (2.5 MG/3ML) 0.083% IN NEBU
INHALATION_SOLUTION | RESPIRATORY_TRACT | Status: AC
  Administered 2019-07-03: 2.5 mg
  Filled 2019-07-03: qty 3

## 2019-07-03 NOTE — Progress Notes (Signed)
PROGRESS NOTE    Wesley [name]  [id number] DOB: 05/23/1965 DOA: 07/01/2019 PCP: [name], MD    Brief Narrative:  54 year old male with a history of COPD chronic hypoxic respiratory failure on home oxygen, admitted to the hospital with shortness of breath, cough and wheezing.  He was found to have COPD exacerbation and admitted for IV steroids/antibiotics/bronchodilator therapy.   Assessment & Plan:   Active Problems:   Acute-on-chronic respiratory failure (HCC)   Opiate dependence (HCC)   Acute on chronic respiratory failure with hypoxia (HCC)   Acute exacerbation of chronic obstructive pulmonary disease (COPD) ([place])   1. Acute on chronic respiratory failure with hypoxia.  Secondary to COPD exacerbation.  Patient was treated with intravenous steroids and antibiotics.  He has completed his course of Rocephin.  We will continue on doxycycline.  Since he appears to be clinically improving, will transition to prednisone and monitor for stability.  COVID-19 is negative.  Pulmonology following, appreciate input.  At baseline he requires 3 to 5 L of oxygen.  Continue to titrate steroids.  Continue on Mucomyst, duo nebs and inhaled steroids.  If he continues to improve, anticipate discharge in the next 24 hours. 2. Chronic back pain/chronic pain syndrome.  Continue on home dose of opiates.  Use Toradol as needed. 3. History of asbestosis.  Continue to follow-up with pulmonology. 4. Tobacco use.  Continue on nicotine patch.   DVT prophylaxis: Lovenox Code Status: Full code Family Communication: Discussed with patient Disposition Plan: Status is: Inpatient  Remains inpatient appropriate because:Inpatient level of care appropriate due to severity of illness   Dispo: The patient is from: Home              Anticipated d/c is to: Home              Anticipated d/c date is: 1 day              Patient currently is not medically stable to d/c.          Consultants:    Pulmonology  Procedures:     Antimicrobials:   Ceftriaxone 4/27 > 4/29  Doxycycline 4/27 >   Subjective: He feels that overall his breathing is improving, but had an episode of coughing this morning.  Complains of worsening of his chronic back pain and is requesting IV pain medications.  Sitting up at the side of the bed, ready to eat his meal, no objective signs of distress.  Objective: Vitals:   07/03/19 0710 07/03/19 1400 07/03/19 1411 07/03/19 1909  BP:  (!) 110/53    Pulse:  96    Resp:  20    Temp:  98.4 F (36.9 C)    TempSrc:  Oral    SpO2: 96% 99% 97% 98%  Weight:      Height:        Intake/Output Summary (Last 24 hours) at 07/03/2019 2046 Last data filed at 07/03/2019 1913 Gross per 24 hour  Intake 1320 ml  Output 1100 ml  Net 220 ml   Filed Weights   07/01/19 1053  Weight: 82.1 kg    Examination:  General exam: Appears calm and comfortable  Respiratory system: Diminished breath sounds, very mild wheeze bilaterally. Respiratory effort normal. Cardiovascular system: S1 & S2 heard, RRR. No JVD, murmurs, rubs, gallops or clicks. No pedal edema. Gastrointestinal system: Abdomen is nondistended, soft and nontender. No organomegaly or masses felt. Normal bowel sounds heard. Central nervous system: Alert and oriented. No  focal neurological deficits. Extremities: Symmetric 5 x 5 power. Skin: No rashes, lesions or ulcers Psychiatry: Judgement and insight appear normal. Mood & affect appropriate.     Data Reviewed: I have personally reviewed following labs and imaging studies  CBC: Recent Labs  Lab 07/01/19 1103 07/02/19 0402  WBC 16.3* 11.8*  HGB 15.1 12.2*  HCT 49.4 40.5  MCV 99.8 100.2*  PLT 275 [id number]   Basic Metabolic Panel: Recent Labs  Lab 07/01/19 1103 07/02/19 0402  NA 137 136  K 3.8 4.2  CL 95* 97*  CO2 32 27  GLUCOSE 144* 271*  BUN 17 17  CREATININE 0.83 0.83  CALCIUM 9.2 9.0  MG  --  2.1   GFR: Estimated Creatinine Clearance:  112.3 mL/min (by C-G formula based on SCr of 0.83 mg/dL). Liver Function Tests: No results for input(s): AST, ALT, ALKPHOS, BILITOT, PROT, ALBUMIN in the last 168 hours. No results for input(s): LIPASE, AMYLASE in the last 168 hours. No results for input(s): AMMONIA in the last 168 hours. Coagulation Profile: No results for input(s): INR, PROTIME in the last 168 hours. Cardiac Enzymes: No results for input(s): CKTOTAL, CKMB, CKMBINDEX, TROPONINI in the last 168 hours. BNP (last 3 results) No results for input(s): PROBNP in the last 8760 hours. HbA1C: No results for input(s): HGBA1C in the last 72 hours. CBG: Recent Labs  Lab 07/02/19 1618 07/02/19 2056 07/03/19 0806 07/03/19 1145 07/03/19 1639  GLUCAP 147* 136* 121* 103* 149*   Lipid Profile: No results for input(s): CHOL, HDL, LDLCALC, TRIG, CHOLHDL, LDLDIRECT in the last 72 hours. Thyroid Function Tests: No results for input(s): TSH, T4TOTAL, FREET4, T3FREE, THYROIDAB in the last 72 hours. Anemia Panel: No results for input(s): VITAMINB12, FOLATE, FERRITIN, TIBC, IRON, RETICCTPCT in the last 72 hours. Sepsis Labs: No results for input(s): PROCALCITON, LATICACIDVEN in the last 168 hours.  Recent Results (from the past 240 hour(s))  Respiratory Panel by RT PCR (Flu A&B, Covid) - Nasopharyngeal Swab     Status: None   Collection Time: 07/01/19 11:25 AM   Specimen: Nasopharyngeal Swab  Result Value Ref Range Status   SARS Coronavirus 2 by RT PCR NEGATIVE NEGATIVE Final    Comment: (NOTE) SARS-CoV-2 target nucleic acids are NOT DETECTED. The SARS-CoV-2 RNA is generally detectable in upper respiratoy specimens during the acute phase of infection. The lowest concentration of SARS-CoV-2 viral copies this assay can detect is 131 copies/mL. A negative result does not preclude SARS-Cov-2 infection and should not be used as the sole basis for treatment or other patient management decisions. A negative result may occur with   improper specimen collection/handling, submission of specimen other than nasopharyngeal swab, presence of viral mutation(s) within the areas targeted by this assay, and inadequate number of viral copies (<131 copies/mL). A negative result must be combined with clinical observations, patient history, and epidemiological information. The expected result is Negative. Fact Sheet for Patients:  PinkCheek.be Fact Sheet for Healthcare Providers:  GravelBags.it This test is not yet ap proved or cleared by the [place] FDA and  has been authorized for detection and/or diagnosis of SARS-CoV-2 by FDA under an Emergency Use Authorization (EUA). This EUA will remain  in effect (meaning this test can be used) for the duration of the COVID-19 declaration under Section 564(b)(1) of the Act, 21 U.S.C. section 360bbb-3(b)(1), unless the authorization is terminated or revoked sooner.    Influenza A by PCR NEGATIVE NEGATIVE Final   Influenza B by PCR NEGATIVE NEGATIVE Final  Comment: (NOTE) The Xpert Xpress SARS-CoV-2/FLU/RSV assay is intended as an aid in  the diagnosis of influenza from Nasopharyngeal swab specimens and  should not be used as a sole basis for treatment. Nasal washings and  aspirates are unacceptable for Xpert Xpress SARS-CoV-2/FLU/RSV  testing. Fact Sheet for Patients: PinkCheek.be Fact Sheet for Healthcare Providers: GravelBags.it This test is not yet approved or cleared by the [place] FDA and  has been authorized for detection and/or diagnosis of SARS-CoV-2 by  FDA under an Emergency Use Authorization (EUA). This EUA will remain  in effect (meaning this test can be used) for the duration of the  Covid-19 declaration under Section 564(b)(1) of the Act, 21  U.S.C. section 360bbb-3(b)(1), unless the authorization is  terminated or revoked. Performed at  [facility], [address] [place], [place] [address]   MRSA PCR Screening     Status: None   Collection Time: 07/01/19  3:47 PM   Specimen: Nasal Mucosa; Nasopharyngeal  Result Value Ref Range Status   MRSA by PCR NEGATIVE NEGATIVE Final    Comment:        The GeneXpert MRSA Assay (FDA approved for NASAL specimens only), is one component of a comprehensive MRSA colonization surveillance program. It is not intended to diagnose MRSA infection nor to guide or monitor treatment for MRSA infections. Performed at [facility], [address], [place], [place] [address]   Expectorated sputum assessment w rflx to resp cult     Status: None   Collection Time: 07/03/19  5:18 AM   Specimen: Expectorated Sputum  Result Value Ref Range Status   Specimen Description EXPECTORATED SPUTUM  Final   Special Requests NONE  Final   Sputum evaluation   Final    THIS SPECIMEN IS ACCEPTABLE FOR SPUTUM CULTURE Performed at [facility], [address] [place], [place] [address]    Report Status 07/03/2019 FINAL  Final         Radiology Studies: No results found.      Scheduled Meds: . acetylcysteine  2 mL Nebulization BID  . albuterol      . aspirin  81 mg Oral Daily  . budesonide (PULMICORT) nebulizer solution  0.25 mg Nebulization BID  . Chlorhexidine Gluconate Cloth  6 each Topical Daily  . doxycycline  100 mg Oral Q12H  . enoxaparin (LOVENOX) injection  40 mg Subcutaneous Q24H  . furosemide  40 mg Oral Daily  . insulin aspart  0-15 Units Subcutaneous TID WC  . insulin aspart  0-5 Units Subcutaneous QHS  . ipratropium-albuterol  3 mL Nebulization Q6H  . montelukast  10 mg Oral Daily  . morphine  30 mg Oral Q8H  . nicotine  21 mg Transdermal Daily  . polyethylene glycol  17 g Oral Daily  . [START ON 07/04/2019] predniSONE  50 mg Oral Q breakfast  . pregabalin  150 mg Oral TID  . sodium chloride flush  3 mL Intravenous Q12H   Continuous Infusions: . sodium chloride 250 mL  (07/01/19 1653)  . albuterol       LOS: 2 days    Time spent: 89mins   Kathie Dike, MD [facility]   If 7PM-7AM, please contact night-coverage [email or web address]  07/03/2019, 8:46 PM

## 2019-07-03 NOTE — TOC Initial Note (Signed)
Transition of Care [facility]) - Initial/Assessment Note    Patient Details  Name: Wesley [name] MRN: [id number] Date of Birth: 27-Oct-1965  Transition of Care [facility]) CM/SW Contact:    Lanetta Figuero, Chauncey Reading, RN Phone Number: 07/03/2019, 12:14 PM  Clinical Narrative:    COPD exacerbation. From home with wife. Has RW, cane, uses prn. Has Oxygen with [employer]. Uses inhalers and nebulizer at home.  Has PCP.  Has insurance, no issues obtaining medications.  Has transportation.   Updated on [facility] benefit and that he will have home follow up call.    Anticipate DC home with self care.                Expected Discharge Plan: Home/Self Care Barriers to Discharge: Continued Medical Work up   Patient Goals and CMS Choice Patient states their goals for this hospitalization and ongoing recovery are:: get his pain under better contril and return home      Expected Discharge Plan and Services Expected Discharge Plan: Home/Self Care   Discharge Planning Services: CM Consult   Living arrangements for the past 2 months: Single Family Home                                      Prior Living Arrangements/Services Living arrangements for the past 2 months: Single Family Home Lives with:: Spouse              Current home services: DME(RW, cane, 02, nebulizer)    Activities of Daily Living Home Assistive Devices/Equipment: Environmental consultant (specify type), Cane (specify quad or straight) ADL Screening (condition at time of admission) Patient's cognitive ability adequate to safely complete daily activities?: Yes Is the patient deaf or have difficulty hearing?: No Does the patient have difficulty seeing, even when wearing glasses/contacts?: No Does the patient have difficulty concentrating, remembering, or making decisions?: No Patient able to express need for assistance with ADLs?: Yes Does the patient have difficulty dressing or bathing?: No Independently performs ADLs?: Yes (appropriate for  developmental age) Does the patient have difficulty walking or climbing stairs?: Yes Weakness of Legs: Both Weakness of Arms/Hands: None   Emotional Assessment Appearance:: Appears stated age   Affect (typically observed): Appropriate Orientation: : Oriented to Self, Oriented to Place, Oriented to  Time, Oriented to Situation      Admission diagnosis:  Acute exacerbation of chronic obstructive pulmonary disease (COPD) ([place]) [J44.1] Hypoxia [R09.02] Patient Active Problem List   Diagnosis Date Noted  . Acute exacerbation of chronic obstructive pulmonary disease (COPD) ([place]) 07/01/2019  . Acute on chronic respiratory failure ([place]) 06/13/2019  . Acute on chronic respiratory failure with hypoxia ([place]) 06/13/2019  . Cellulitis of abdominal wall   . Leg pain   . SOB (shortness of breath) 01/09/2019  . Chronic respiratory failure with hypoxia ([place]) 01/09/2019  . CAP (community acquired pneumonia) 01/08/2019  . Normal coronary arteries 08/02/2016  . Atypical chest pain 07/20/2016  . Acute coronary syndrome ([place]) 07/19/2016  . Abnormal stress test 07/19/2016  . Unstable angina ([place]) 07/19/2016  . Sepsis ([place]) 03/30/2016  . HCAP (healthcare-associated pneumonia) 03/30/2016  . Hypokalemia 01/08/2016  . History of DVT (deep vein thrombosis) 01/08/2016  . OA (osteoarthritis) of hip 09/24/2015  . Community acquired pneumonia 04/14/2015  . Vomiting and diarrhea   . Solitary pulmonary nodule 05/18/2014  . Nausea and vomiting 05/16/2014  . Bronchitis, acute, with bronchospasm 02/24/2014  . Osteoporosis,  unspecified 08/06/2012  . Hip pain 08/06/2012  . Opiate abuse, continuous ([place]) 01/05/2012  . Opiate dependence ([place]) 01/03/2012  . Chronic respiratory failure ([place]) 01/03/2012  . COPD exacerbation ([place]) 01/03/2012  . Pulmonary infection 01/03/2012  . Pneumonia 07/19/2011  . Fever 07/18/2011  . Myalgia 07/18/2011  . Leukocytosis 07/18/2011  . COPD (chronic obstructive pulmonary  disease) ([place]) 07/18/2011  . Acute-on-chronic respiratory failure ([place]) 07/18/2011  . Tachycardia 07/18/2011  . Dehydration 07/18/2011  . Chronic pain 07/18/2011  . Tobacco abuse 07/18/2011   PCP:  [name], MD Pharmacy:   [place], [place] [id number] [place] [id number] Phone: [phone] Fax: [phone]     Social Determinants of Health (SDOH) Interventions    Readmission Risk Interventions Readmission Risk Prevention Plan 07/03/2019  Transportation Screening Complete  PCP or Specialist Appt within 3-5 Days Complete  Social Work Consult for [place] Planning/Counseling Complete  Palliative Care Screening Not Applicable  Medication Review Press photographer) Complete  Some recent data might be hidden

## 2019-07-04 ENCOUNTER — Encounter (HOSPITAL_COMMUNITY): Payer: Self-pay | Admitting: Internal Medicine

## 2019-07-04 DIAGNOSIS — J9621 Acute and chronic respiratory failure with hypoxia: Principal | ICD-10-CM

## 2019-07-04 LAB — GLUCOSE, CAPILLARY
Glucose-Capillary: 148 mg/dL — ABNORMAL HIGH (ref 70–99)
Glucose-Capillary: 221 mg/dL — ABNORMAL HIGH (ref 70–99)
Glucose-Capillary: 181 mg/dL — ABNORMAL HIGH (ref 70–99)
Glucose-Capillary: 140 mg/dL — ABNORMAL HIGH (ref 70–99)

## 2019-07-04 MED ORDER — PREDNISONE 10 MG PO TABS: ORAL_TABLET | ORAL | 0 refills | Status: DC

## 2019-07-04 MED ORDER — BLOOD GLUCOSE MONITOR KIT: PACK | 0 refills | Status: DC

## 2019-07-04 MED ORDER — GUAIFENESIN-DM 100-10 MG/5ML PO SYRP: 5.0000 mL | ORAL_SOLUTION | ORAL | 0 refills | Status: DC | PRN

## 2019-07-04 MED ORDER — DOXYCYCLINE HYCLATE 100 MG PO TABS: 100.0000 mg | ORAL_TABLET | Freq: Two times a day (BID) | ORAL | 0 refills | Status: DC

## 2019-07-04 MED ORDER — SODIUM CHLORIDE 0.9 % IV SOLN
1.0000 g | INTRAVENOUS | Status: DC
  Administered 2019-07-04: 1 g via INTRAVENOUS
  Filled 2019-07-04: qty 10

## 2019-07-04 NOTE — Progress Notes (Signed)
Patients Heart rate 116 after ambulating in room. Vital signs obtained. Mews Score 2, Yellow. Heart rate 98 at this time. MD notified.

## 2019-07-04 NOTE — Plan of Care (Signed)

## 2019-07-04 NOTE — Care Management Important Message (Signed)
Important Message  Patient Details  Name: Wesley [name] MRN: [id number] Date of Birth: September 24, 1965   Medicare Important Message Given:  Yes     Tommy Medal 07/04/2019, 1:46 PM

## 2019-07-04 NOTE — Progress Notes (Signed)
PROGRESS NOTE    Wesley [name]  [id number] DOB: [birth year] DOA: 07/01/2019 PCP: [name], MD    Brief Narrative:  54 year old male with a history of COPD chronic hypoxic respiratory failure on home oxygen, admitted to the hospital with shortness of breath, cough and wheezing.  He was found to have COPD exacerbation and admitted for IV steroids/antibiotics/bronchodilator therapy.   Assessment & Plan:   Active Problems:   Acute-on-chronic respiratory failure (HCC)   Opiate dependence (HCC)   Acute on chronic respiratory failure with hypoxia (HCC)   Acute exacerbation of chronic obstructive pulmonary disease (COPD) ([place])   1. Acute on chronic respiratory failure with hypoxia.  Secondary to COPD exacerbation.  Started on intravenous steroids.  He is on antibiotics.  COVID-19 is negative.  Pulmonology following, appreciate input.  He has had mild improvement, although is not back to baseline.  At baseline he requires 3 to 5 L of oxygen.  Continue to titrate steroids.  Continue on Mucomyst, duo nebs and inhaled steroids.  Sputum culture positive for Pseudomonas.  Continue ceftriaxone until culture sensitivities available. 2. Chronic back pain/chronic pain syndrome.  Continue on home dose of opiates.  Use Toradol as needed. 3. History of asbestosis.  Continue to follow-up with pulmonology. 4. Tobacco use.  Continue on nicotine patch.   DVT prophylaxis: Lovenox Code Status: Full code Family Communication: Discussed with patient Disposition Plan: Status is: Inpatient  Remains inpatient appropriate because:IV treatments appropriate due to intensity of illness or inability to take PO.  Continue on ceftriaxone until sensitivities of Pseudomonas culture are available.  Anticipate discharge in the next 24 hours.   Dispo: The patient is from: Home              Anticipated d/c is to: Home              Anticipated d/c date is: 2 days              Patient currently is not medically  stable to d/c.    Consultants:   Pulmonology  Procedures:     Antimicrobials:   Ceftriaxone 4/27 >  Doxycycline 4/27 >   Subjective: Still has some shortness of breath.  Has a productive cough.  Objective: Vitals:   07/04/19 1427 07/04/19 1612 07/04/19 2012 07/04/19 2016  BP:  126/75    Pulse:  95    Resp:  20    Temp:  98.1 F (36.7 C)    TempSrc:  Oral    SpO2: 98% 95% 96% 96%  Weight:      Height:        Intake/Output Summary (Last 24 hours) at 07/04/2019 2054 Last data filed at 07/04/2019 1900 Gross per 24 hour  Intake 1533.57 ml  Output 2900 ml  Net -1366.43 ml   Filed Weights   07/01/19 1053  Weight: 82.1 kg    Examination: General exam: Alert, awake, oriented x 3 Respiratory system: Mild bilateral wheeze. Respiratory effort normal. Cardiovascular system:RRR. No murmurs, rubs, gallops. Gastrointestinal system: Abdomen is nondistended, soft and nontender. No organomegaly or masses felt. Normal bowel sounds heard. Central nervous system: Alert and oriented. No focal neurological deficits. Extremities: No C/C/E, +pedal pulses Skin: No rashes, lesions or ulcers Psychiatry: Judgement and insight appear normal. Mood & affect appropriate.    Data Reviewed: I have personally reviewed following labs and imaging studies  CBC: Recent Labs  Lab 07/01/19 1103 07/02/19 0402  WBC 16.3* 11.8*  HGB 15.1 12.2*  HCT  49.4 40.5  MCV 99.8 100.2*  PLT 275 [id number]   Basic Metabolic Panel: Recent Labs  Lab 07/01/19 1103 07/02/19 0402  NA 137 136  K 3.8 4.2  CL 95* 97*  CO2 32 27  GLUCOSE 144* 271*  BUN 17 17  CREATININE 0.83 0.83  CALCIUM 9.2 9.0  MG  --  2.1   GFR: Estimated Creatinine Clearance: 112.3 mL/min (by C-G formula based on SCr of 0.83 mg/dL). Liver Function Tests: No results for input(s): AST, ALT, ALKPHOS, BILITOT, PROT, ALBUMIN in the last 168 hours. No results for input(s): LIPASE, AMYLASE in the last 168 hours. No results for  input(s): AMMONIA in the last 168 hours. Coagulation Profile: No results for input(s): INR, PROTIME in the last 168 hours. Cardiac Enzymes: No results for input(s): CKTOTAL, CKMB, CKMBINDEX, TROPONINI in the last 168 hours. BNP (last 3 results) No results for input(s): PROBNP in the last 8760 hours. HbA1C: No results for input(s): HGBA1C in the last 72 hours. CBG: Recent Labs  Lab 07/03/19 1639 07/03/19 2132 07/04/19 0805 07/04/19 1201 07/04/19 1637  GLUCAP 149* 185* 148* 221* 181*   Lipid Profile: No results for input(s): CHOL, HDL, LDLCALC, TRIG, CHOLHDL, LDLDIRECT in the last 72 hours. Thyroid Function Tests: No results for input(s): TSH, T4TOTAL, FREET4, T3FREE, THYROIDAB in the last 72 hours. Anemia Panel: No results for input(s): VITAMINB12, FOLATE, FERRITIN, TIBC, IRON, RETICCTPCT in the last 72 hours. Sepsis Labs: No results for input(s): PROCALCITON, LATICACIDVEN in the last 168 hours.  Recent Results (from the past 240 hour(s))  Respiratory Panel by RT PCR (Flu A&B, Covid) - Nasopharyngeal Swab     Status: None   Collection Time: 07/01/19 11:25 AM   Specimen: Nasopharyngeal Swab  Result Value Ref Range Status   SARS Coronavirus 2 by RT PCR NEGATIVE NEGATIVE Final    Comment: (NOTE) SARS-CoV-2 target nucleic acids are NOT DETECTED. The SARS-CoV-2 RNA is generally detectable in upper respiratoy specimens during the acute phase of infection. The lowest concentration of SARS-CoV-2 viral copies this assay can detect is 131 copies/mL. A negative result does not preclude SARS-Cov-2 infection and should not be used as the sole basis for treatment or other patient management decisions. A negative result may occur with  improper specimen collection/handling, submission of specimen other than nasopharyngeal swab, presence of viral mutation(s) within the areas targeted by this assay, and inadequate number of viral copies (<131 copies/mL). A negative result must be combined  with clinical observations, patient history, and epidemiological information. The expected result is Negative. Fact Sheet for Patients:  PinkCheek.be Fact Sheet for Healthcare Providers:  GravelBags.it This test is not yet ap proved or cleared by the [place] FDA and  has been authorized for detection and/or diagnosis of SARS-CoV-2 by FDA under an Emergency Use Authorization (EUA). This EUA will remain  in effect (meaning this test can be used) for the duration of the COVID-19 declaration under Section 564(b)(1) of the Act, 21 U.S.C. section 360bbb-3(b)(1), unless the authorization is terminated or revoked sooner.    Influenza A by PCR NEGATIVE NEGATIVE Final   Influenza B by PCR NEGATIVE NEGATIVE Final    Comment: (NOTE) The Xpert Xpress SARS-CoV-2/FLU/RSV assay is intended as an aid in  the diagnosis of influenza from Nasopharyngeal swab specimens and  should not be used as a sole basis for treatment. Nasal washings and  aspirates are unacceptable for Xpert Xpress SARS-CoV-2/FLU/RSV  testing. Fact Sheet for Patients: PinkCheek.be Fact Sheet for Healthcare Providers: GravelBags.it  This test is not yet approved or cleared by the [place] and  has been authorized for detection and/or diagnosis of SARS-CoV-2 by  FDA under an Emergency Use Authorization (EUA). This EUA will remain  in effect (meaning this test can be used) for the duration of the  Covid-19 declaration under Section 564(b)(1) of the Act, 21  U.S.C. section 360bbb-3(b)(1), unless the authorization is  terminated or revoked. Performed at [facility], [address], [place], [place] [address]   MRSA PCR Screening     Status: None   Collection Time: 07/01/19  3:47 PM   Specimen: Nasal Mucosa; Nasopharyngeal  Result Value Ref Range Status   MRSA by PCR NEGATIVE NEGATIVE Final    Comment:         The GeneXpert MRSA Assay (FDA approved for NASAL specimens only), is one component of a comprehensive MRSA colonization surveillance program. It is not intended to diagnose MRSA infection nor to guide or monitor treatment for MRSA infections. Performed at [facility], [address] [place], [place] [address]   Expectorated sputum assessment w rflx to resp cult     Status: None   Collection Time: 07/03/19  5:18 AM   Specimen: Expectorated Sputum  Result Value Ref Range Status   Specimen Description EXPECTORATED SPUTUM  Final   Special Requests NONE  Final   Sputum evaluation   Final    THIS SPECIMEN IS ACCEPTABLE FOR SPUTUM CULTURE Performed at [facility], [address] [place], [place] [address]    Report Status 07/03/2019 FINAL  Final  Culture, respiratory     Status: None (Preliminary result)   Collection Time: 07/03/19  5:18 AM  Result Value Ref Range Status   Specimen Description   Final    EXPECTORATED SPUTUM Performed at [facility], [address] [place], [place] [address]    Special Requests   Final    NONE Reflexed from [id number] Performed at [facility], [address], [place], [place] [address]    Gram Stain   Final    ABUNDANT WBC PRESENT,BOTH PMN AND MONONUCLEAR FEW GRAM POSITIVE COCCI RARE GRAM NEGATIVE RODS    Culture   Final    FEW PSEUDOMONAS AERUGINOSA SUSCEPTIBILITIES TO FOLLOW Performed at [facility], [place] [address] [place], [place] [address]    Report Status PENDING  Incomplete         Radiology Studies: No results found.      Scheduled Meds: . acetylcysteine  2 mL Nebulization BID  . aspirin  81 mg Oral Daily  . budesonide (PULMICORT) nebulizer solution  0.25 mg Nebulization BID  . doxycycline  100 mg Oral Q12H  . enoxaparin (LOVENOX) injection  40 mg Subcutaneous Q24H  . furosemide  40 mg Oral Daily  . insulin aspart  0-15 Units Subcutaneous TID WC  . insulin aspart  0-5 Units Subcutaneous QHS  .  ipratropium-albuterol  3 mL Nebulization Q6H  . montelukast  10 mg Oral Daily  . morphine  30 mg Oral Q8H  . nicotine  21 mg Transdermal Daily  . polyethylene glycol  17 g Oral Daily  . predniSONE  50 mg Oral Q breakfast  . pregabalin  150 mg Oral TID  . sodium chloride flush  3 mL Intravenous Q12H   Continuous Infusions: . sodium chloride Stopped (07/04/19 1801)  . albuterol    . cefTRIAXone (ROCEPHIN)  IV 200 mL/hr at 07/04/19 1826     LOS: 3 days  Time spent: 75mins   Kathie Dike, MD [facility]   If 7PM-7AM, please contact night-coverage [email or web address]  07/04/2019, 8:54 PM

## 2019-07-04 NOTE — Progress Notes (Signed)
   Name: Wesley [name] MRN: [id number] DOB: [birth year]    ADMISSION DATE:  07/01/2019 CONSULTATION DATE:  07/04/2019   REFERRING MD :  Manuella Ghazi, triad MD  CHIEF COMPLAINT:  resp distress, AECOPD  BRIEF PATIENT DESCRIPTION: 54 year old with bullous emphysema and chronic hypoxic respiratory failure on 3 L O2 with recurrent admission for COPD exacerbation  PMH- s/p Rt VATS bullectomy 01/16/13, s/p Lt VATS bullectomy 08/28/13, Asbestos exposure, Lt lung hernia , opiate use disorder   SIGNIFICANT EVENTS  4/13 discharged after admission for a COPD  STUDIES:   A1AT 01/14/19 >> 165  PFT 11/20/12 >> FEV1 0.86 (21%), FEV1% 30, TLC 10.41 (145%), DLCO 42%  CT chest 06/15/19 >> severe emphysema with bullous changes at apices, prior wedge resection from Rt and Lt upper lobes, lung hernia on Lt  Echo 06/16/19 >> EF 65 to 70%     SUBJECTIVE:   Overall improved. More dyspneic this morning after walking to the bathroom. Remains on 5 L nasal cannula Complains of yellow sputum  VITAL SIGNS: Temp:  [98 F (36.7 C)-98.3 F (36.8 C)] 98 F (36.7 C) (04/30 0451) Pulse Rate:  [98-112] 100 (04/30 0451) Resp:  [17-20] 20 (04/30 0451) BP: (112-121)/(61-67) 121/67 (04/30 0451) SpO2:  [96 %-98 %] 98 % (04/30 1427)  PHYSICAL EXAMINATION: General: Elderly man, sitting up in bed, no distress Neuro: Alert, interactive, nonfocal, mild tremors HEENT: No JVD, mild pallor, no icterus Cardiovascular: S1-S2 regular, sinus on monitor Lungs: Decreased breath sounds bilateral, faint expiratory rhonchi , hernia on left lateral chest wall with coughing Abdomen: Soft, nontender Musculoskeletal: No edema, no deformity Skin: Ecchymosis over both arms and legs  Recent Labs  Lab 07/01/19 1103 07/02/19 0402  NA 137 136  K 3.8 4.2  CL 95* 97*  CO2 32 27  BUN 17 17  CREATININE 0.83 0.83  GLUCOSE 144* 271*   Recent Labs  Lab 07/01/19 1103 07/02/19 0402  HGB 15.1 12.2*  HCT 49.4 40.5  WBC 16.3* 11.8*   PLT 275 242   No results found.  ASSESSMENT / PLAN:  Acute exacerbation of COPD Bullous emphysema Chronic hypoxic respiratory failure   -Respiratory culture showing Pseudomonas He denies seasonal allergies, head CT 4/10 does not show any evidence of sinus collection -ABG 01/2019 7.29/65 suggesting chronic hypercarbia  Recommend -Slow taper of prednisone over 2 weeks -Pulmicort Brovana nebs while inpatient -Continue hypertonic saline nebs on discharge for tracheobronchial toilet -Await sensitivity on Pseudomonas, antibiotic was stopped for some reason-would continue at least 7 to 10 days of treatment  Given that this is a readmission, best to delay discharge for 24 hours until Pseudomonas is adequately treated and we know we are on the path to recovery   Acute on chronic pain syndrome on MS Contin -use IV Toradol for breakthrough musculoskeletal pain  Outpatient pulmonary office appointment with dr Halford Chessman  for 5/10  Kara Mead MD. [facility]. [facility]  If no response to pager , please call [phone]    07/04/2019, 3:07 PM

## 2019-07-05 LAB — CULTURE, RESPIRATORY W GRAM STAIN

## 2019-07-05 LAB — GLUCOSE, CAPILLARY
Glucose-Capillary: 89 mg/dL (ref 70–99)
Glucose-Capillary: 107 mg/dL — ABNORMAL HIGH (ref 70–99)
Glucose-Capillary: 174 mg/dL — ABNORMAL HIGH (ref 70–99)
Glucose-Capillary: 90 mg/dL (ref 70–99)

## 2019-07-05 MED ORDER — FUROSEMIDE 40 MG PO TABS: 40.0000 mg | ORAL_TABLET | Freq: Every day | ORAL | 0 refills | Status: DC

## 2019-07-05 MED ORDER — LEVOFLOXACIN 500 MG PO TABS
500.0000 mg | ORAL_TABLET | Freq: Every day | ORAL | Status: DC
  Administered 2019-07-05 – 2019-07-06 (×2): 500 mg via ORAL
  Filled 2019-07-05 (×2): qty 1

## 2019-07-05 MED ORDER — FUROSEMIDE 10 MG/ML IJ SOLN
40.0000 mg | Freq: Once | INTRAMUSCULAR | Status: AC
  Administered 2019-07-05: 40 mg via INTRAVENOUS
  Filled 2019-07-05: qty 4

## 2019-07-05 MED ORDER — LEVOFLOXACIN 500 MG PO TABS: 500.0000 mg | ORAL_TABLET | Freq: Every day | ORAL | 0 refills | Status: DC

## 2019-07-05 NOTE — Progress Notes (Signed)
PROGRESS NOTE    Wesley [name]  [id number] DOB: 06/03/1965 DOA: 07/01/2019 PCP: [name], MD    Brief Narrative:  54 year old male with a history of COPD chronic hypoxic respiratory failure on home oxygen, admitted to the hospital with shortness of breath, cough and wheezing.  He was found to have COPD exacerbation and admitted for IV steroids/antibiotics/bronchodilator therapy.   Assessment & Plan:   Active Problems:   Acute-on-chronic respiratory failure (HCC)   Opiate dependence (HCC)   Acute on chronic respiratory failure with hypoxia (HCC)   Acute exacerbation of chronic obstructive pulmonary disease (COPD) ([place])   1. Acute on chronic respiratory failure with hypoxia.  Secondary to COPD exacerbation.  Started on intravenous steroids.  He is on antibiotics.  COVID-19 is negative.  Pulmonology following, appreciate input.  He has had mild improvement, although is not back to baseline.  At baseline he requires 3 to 5 L of oxygen.  Continue to titrate steroids.  Continue on Mucomyst, duo nebs and inhaled steroids.  Sputum culture positive for Pseudomonas.  Antibiotics changed to levofloxacin. 2. Chronic back pain/chronic pain syndrome.  Continue on home dose of opiates.  Use Toradol as needed. 3. History of asbestosis.  Continue to follow-up with pulmonology. 4. Tobacco use.  Continue on nicotine patch. 5. Lower extremity edema.  Suspect this is venous stasis.  Will check venous Dopplers to rule out DVT.  He is chronically on Lasix.  Recent echo showed preserved ejection fraction and normal BNP.   DVT prophylaxis: Lovenox Code Status: Full code Family Communication: Discussed with patient Disposition Plan: Status is: Inpatient  Remains inpatient appropriate because:IV treatments appropriate due to intensity of illness or inability to take PO.  Patient reports that he would not be safe for discharge home today since he will be home alone and unable to adequately ambulate  around his house due to respiratory difficulty.  Reports that his wife will be at home tomorrow.   Dispo: The patient is from: Home              Anticipated d/c is to: Home              Anticipated d/c date is: 2 days              Patient currently is not medically stable to d/c.    Consultants:   Pulmonology  Procedures:     Antimicrobials:   Ceftriaxone 4/27 >4/30  Doxycycline 4/27 >5/1  Levofloxacin 5/1>   Subjective: Reports increasing swelling in lower extremities bilaterally.  Feels abdomen is swollen.  At times he says that he is short of breath on exertion, but other times he says that his "breathing is set".  Continues to have cough.  Objective: Vitals:   07/05/19 0222 07/05/19 0528 07/05/19 0716 07/05/19 0721  BP: 110/73 112/73    Pulse: 100 83    Resp: 16 18    Temp: 98.1 F (36.7 C) 97.9 F (36.6 C)    TempSrc:  Oral    SpO2: 98% 100% 99% 99%  Weight:      Height:        Intake/Output Summary (Last 24 hours) at 07/05/2019 1518 Last data filed at 07/05/2019 0701 Gross per 24 hour  Intake 1081.76 ml  Output 1700 ml  Net -618.24 ml   Filed Weights   07/01/19 1053  Weight: 82.1 kg    Examination: General exam: Alert, awake, oriented x 3 Respiratory system: Mild end expiratory wheeze  on forced expiration.  Respiratory effort appears to be at baseline. Cardiovascular system:RRR. No murmurs, rubs, gallops. Gastrointestinal system: Abdomen is nondistended, soft and nontender. No organomegaly or masses felt. Normal bowel sounds heard. Central nervous system: Alert and oriented. No focal neurological deficits. Extremities: 1+ edema bilaterally Skin: No rashes, lesions or ulcers Psychiatry: Judgement and insight appear normal. Mood & affect appropriate.    Data Reviewed: I have personally reviewed following labs and imaging studies  CBC: Recent Labs  Lab 07/01/19 1103 07/02/19 0402  WBC 16.3* 11.8*  HGB 15.1 12.2*  HCT 49.4 40.5  MCV 99.8  100.2*  PLT 275 [id number]   Basic Metabolic Panel: Recent Labs  Lab 07/01/19 1103 07/02/19 0402  NA 137 136  K 3.8 4.2  CL 95* 97*  CO2 32 27  GLUCOSE 144* 271*  BUN 17 17  CREATININE 0.83 0.83  CALCIUM 9.2 9.0  MG  --  2.1   GFR: Estimated Creatinine Clearance: 112.3 mL/min (by C-G formula based on SCr of 0.83 mg/dL). Liver Function Tests: No results for input(s): AST, ALT, ALKPHOS, BILITOT, PROT, ALBUMIN in the last 168 hours. No results for input(s): LIPASE, AMYLASE in the last 168 hours. No results for input(s): AMMONIA in the last 168 hours. Coagulation Profile: No results for input(s): INR, PROTIME in the last 168 hours. Cardiac Enzymes: No results for input(s): CKTOTAL, CKMB, CKMBINDEX, TROPONINI in the last 168 hours. BNP (last 3 results) No results for input(s): PROBNP in the last 8760 hours. HbA1C: No results for input(s): HGBA1C in the last 72 hours. CBG: Recent Labs  Lab 07/04/19 1201 07/04/19 1637 07/04/19 2120 07/05/19 0817 07/05/19 1138  GLUCAP 221* 181* 140* 89 107*   Lipid Profile: No results for input(s): CHOL, HDL, LDLCALC, TRIG, CHOLHDL, LDLDIRECT in the last 72 hours. Thyroid Function Tests: No results for input(s): TSH, T4TOTAL, FREET4, T3FREE, THYROIDAB in the last 72 hours. Anemia Panel: No results for input(s): VITAMINB12, FOLATE, FERRITIN, TIBC, IRON, RETICCTPCT in the last 72 hours. Sepsis Labs: No results for input(s): PROCALCITON, LATICACIDVEN in the last 168 hours.  Recent Results (from the past 240 hour(s))  Respiratory Panel by RT PCR (Flu A&B, Covid) - Nasopharyngeal Swab     Status: None   Collection Time: 07/01/19 11:25 AM   Specimen: Nasopharyngeal Swab  Result Value Ref Range Status   SARS Coronavirus 2 by RT PCR NEGATIVE NEGATIVE Final    Comment: (NOTE) SARS-CoV-2 target nucleic acids are NOT DETECTED. The SARS-CoV-2 RNA is generally detectable in upper respiratoy specimens during the acute phase of infection. The lowest  concentration of SARS-CoV-2 viral copies this assay can detect is 131 copies/mL. A negative result does not preclude SARS-Cov-2 infection and should not be used as the sole basis for treatment or other patient management decisions. A negative result may occur with  improper specimen collection/handling, submission of specimen other than nasopharyngeal swab, presence of viral mutation(s) within the areas targeted by this assay, and inadequate number of viral copies (<131 copies/mL). A negative result must be combined with clinical observations, patient history, and epidemiological information. The expected result is Negative. Fact Sheet for Patients:  PinkCheek.be Fact Sheet for Healthcare Providers:  GravelBags.it This test is not yet ap proved or cleared by the [place] FDA and  has been authorized for detection and/or diagnosis of SARS-CoV-2 by FDA under an Emergency Use Authorization (EUA). This EUA will remain  in effect (meaning this test can be used) for the duration of the COVID-19 declaration  under Section 564(b)(1) of the Act, 21 U.S.C. section 360bbb-3(b)(1), unless the authorization is terminated or revoked sooner.    Influenza A by PCR NEGATIVE NEGATIVE Final   Influenza B by PCR NEGATIVE NEGATIVE Final    Comment: (NOTE) The Xpert Xpress SARS-CoV-2/FLU/RSV assay is intended as an aid in  the diagnosis of influenza from Nasopharyngeal swab specimens and  should not be used as a sole basis for treatment. Nasal washings and  aspirates are unacceptable for Xpert Xpress SARS-CoV-2/FLU/RSV  testing. Fact Sheet for Patients: PinkCheek.be Fact Sheet for Healthcare Providers: GravelBags.it This test is not yet approved or cleared by the [place] FDA and  has been authorized for detection and/or diagnosis of SARS-CoV-2 by  FDA under an Emergency Use  Authorization (EUA). This EUA will remain  in effect (meaning this test can be used) for the duration of the  Covid-19 declaration under Section 564(b)(1) of the Act, 21  U.S.C. section 360bbb-3(b)(1), unless the authorization is  terminated or revoked. Performed at [facility], [address], [place], [place] [address]   MRSA PCR Screening     Status: None   Collection Time: 07/01/19  3:47 PM   Specimen: Nasal Mucosa; Nasopharyngeal  Result Value Ref Range Status   MRSA by PCR NEGATIVE NEGATIVE Final    Comment:        The GeneXpert MRSA Assay (FDA approved for NASAL specimens only), is one component of a comprehensive MRSA colonization surveillance program. It is not intended to diagnose MRSA infection nor to guide or monitor treatment for MRSA infections. Performed at [facility], [address] [place], [place] [address]   Expectorated sputum assessment w rflx to resp cult     Status: None   Collection Time: 07/03/19  5:18 AM   Specimen: Expectorated Sputum  Result Value Ref Range Status   Specimen Description EXPECTORATED SPUTUM  Final   Special Requests NONE  Final   Sputum evaluation   Final    THIS SPECIMEN IS ACCEPTABLE FOR SPUTUM CULTURE Performed at [facility], [address], [place], [place] [address]    Report Status 07/03/2019 FINAL  Final  Culture, respiratory     Status: None   Collection Time: 07/03/19  5:18 AM  Result Value Ref Range Status   Specimen Description   Final    EXPECTORATED SPUTUM Performed at [facility], [address] [place], [place] [address]    Special Requests   Final    NONE Reflexed from [id number] Performed at [facility], [address] [place], [place] [address]    Gram Stain   Final    ABUNDANT WBC PRESENT,BOTH PMN AND MONONUCLEAR FEW GRAM POSITIVE COCCI RARE GRAM NEGATIVE RODS    Culture FEW PSEUDOMONAS AERUGINOSA  Final   Report Status 07/05/2019 FINAL  Final   Organism ID, Bacteria PSEUDOMONAS AERUGINOSA  Final       Susceptibility   Pseudomonas aeruginosa - MIC*    CEFTAZIDIME 4 SENSITIVE Sensitive     CIPROFLOXACIN <=0.25 SENSITIVE Sensitive     GENTAMICIN 2 SENSITIVE Sensitive     IMIPENEM 2 SENSITIVE Sensitive     PIP/TAZO 8 SENSITIVE Sensitive     CEFEPIME 2 SENSITIVE Sensitive     * FEW PSEUDOMONAS AERUGINOSA         Radiology Studies: No results found.      Scheduled Meds: . acetylcysteine  2 mL Nebulization BID  . aspirin  81 mg Oral Daily  . budesonide (PULMICORT) nebulizer solution  0.25 mg Nebulization BID  . enoxaparin (LOVENOX) injection  40 mg Subcutaneous Q24H  . furosemide  40 mg Oral Daily  . insulin aspart  0-15 Units Subcutaneous TID WC  . insulin aspart  0-5 Units Subcutaneous QHS  . ipratropium-albuterol  3 mL Nebulization Q6H  . levofloxacin  500 mg Oral Daily  . montelukast  10 mg Oral Daily  . morphine  30 mg Oral Q8H  . nicotine  21 mg Transdermal Daily  . polyethylene glycol  17 g Oral Daily  . predniSONE  50 mg Oral Q breakfast  . pregabalin  150 mg Oral TID  . sodium chloride flush  3 mL Intravenous Q12H   Continuous Infusions: . sodium chloride Stopped (07/04/19 1801)  . albuterol       LOS: 4 days    Time spent: 62mins   Kathie Dike, MD [facility]   If 7PM-7AM, please contact night-coverage [email or web address]  07/05/2019, 3:18 PM

## 2019-07-06 ENCOUNTER — Inpatient Hospital Stay (HOSPITAL_COMMUNITY): Payer: Medicare HMO

## 2019-07-06 ENCOUNTER — Other Ambulatory Visit: Payer: Self-pay | Admitting: Internal Medicine

## 2019-07-06 DIAGNOSIS — F1120 Opioid dependence, uncomplicated: Secondary | ICD-10-CM

## 2019-07-06 LAB — GLUCOSE, CAPILLARY
Glucose-Capillary: 124 mg/dL — ABNORMAL HIGH (ref 70–99)
Glucose-Capillary: 140 mg/dL — ABNORMAL HIGH (ref 70–99)
Glucose-Capillary: 73 mg/dL (ref 70–99)

## 2019-07-06 NOTE — Progress Notes (Signed)
Nsg Discharge Note  Admit Date:  07/01/2019 Discharge date: 07/06/2019   Herbie Saxon to be D/C'd Home   per MD order.  AVS completed.  Reviewed d/c paperwork with patient. Answered all questions. Gave paper prescription for glucose meter. Wheeled stable patient and belongings (O2 tank) to main entrance where he was picked up by his sister-in-law to d/c to home. Copy for chart, and copy for patient signed, and dated. Patient/caregiver able to verbalize understanding.  Discharge Medication: Allergies as of 07/06/2019      Reactions   Ambien [zolpidem Tartrate] Other (See Comments)   hallucinations   Melatonin Itching   Zolpidem Other (See Comments)   Altered mental status      Medication List    STOP taking these medications   budesonide-formoterol 160-4.5 MCG/ACT inhaler Commonly known as: SYMBICORT   cephALEXin 500 MG capsule Commonly known as: KEFLEX   tiotropium 18 MCG inhalation capsule Commonly known as: SPIRIVA     TAKE these medications   acetaminophen 325 MG tablet Commonly known as: TYLENOL Take 2 tablets by mouth every 4 (four) hours as needed.   Advair Diskus 250-50 MCG/DOSE Aepb Generic drug: Fluticasone-Salmeterol Inhale 1 puff into the lungs 2 (two) times daily.   albuterol (2.5 MG/3ML) 0.083% nebulizer solution Commonly known as: PROVENTIL Take 3 mLs (2.5 mg total) by nebulization every 6 (six) hours as needed for wheezing or shortness of breath. What changed: when to take this   albuterol 108 (90 Base) MCG/ACT inhaler Commonly known as: VENTOLIN HFA Inhale 2 puffs into the lungs every 4 (four) hours as needed for wheezing or shortness of breath. Shortness of breath What changed: Another medication with the same name was changed. Make sure you understand how and when to take each.   aspirin 81 MG chewable tablet Chew 1 tablet (81 mg total) by mouth daily.   blood glucose meter kit and supplies Kit Dispense based on patient and insurance preference.  Use up to four times daily as directed. (FOR ICD-9 250.00, 250.01).   furosemide 40 MG tablet Commonly known as: LASIX Take 1 tablet (40 mg total) by mouth daily.   guaiFENesin-dextromethorphan 100-10 MG/5ML syrup Commonly known as: ROBITUSSIN DM Take 5 mLs by mouth every 4 (four) hours as needed for cough.   Incruse Ellipta 62.5 MCG/INH Aepb Generic drug: umeclidinium bromide Inhale 1 puff into the lungs daily.   levofloxacin 500 MG tablet Commonly known as: LEVAQUIN Take 1 tablet (500 mg total) by mouth daily.   loperamide 2 MG capsule Commonly known as: IMODIUM Take 1 capsule by mouth 4 (four) times daily.   montelukast 10 MG tablet Commonly known as: SINGULAIR Take 1 tablet by mouth daily.   morphine 30 MG 12 hr tablet Commonly known as: MS CONTIN Take 30 mg by mouth every 8 (eight) hours.   mupirocin ointment 2 % Commonly known as: BACTROBAN Place 1 application into the nose daily.   Narcan 4 MG/0.1ML Liqd nasal spray kit Generic drug: naloxone Place 4 mg into the nose as directed. To prevent overdose   ondansetron 4 MG disintegrating tablet Commonly known as: ZOFRAN-ODT Take 1 tablet by mouth every 8 (eight) hours as needed.   oxycodone 30 MG immediate release tablet Commonly known as: ROXICODONE Take 30 mg by mouth every 4 (four) hours as needed for pain.   predniSONE 10 MG tablet Commonly known as: DELTASONE Take 43m po daily for 2 days then 423mpo daily for 2 days then 3066maily  for 2 days then 42m daily for 2 days then 113mdaily for 2 days then stop What changed:   how much to take  how to take this  when to take this  additional instructions   pregabalin 150 MG capsule Commonly known as: LYRICA Take 1 capsule by mouth 3 (three) times daily.   Systane 0.4-0.3 % Soln Generic drug: Polyethyl Glycol-Propyl Glycol Place 1-2 drops into the left eye daily as needed (for dry eye relief).       Discharge Assessment: Vitals:   07/06/19  0806 07/06/19 1424  BP:    Pulse:    Resp:    Temp:    SpO2: 96% 95%   Skin clean, dry and intact without evidence of skin break down, no evidence of skin tears noted. IV catheter discontinued intact. Site without signs and symptoms of complications - no redness or edema noted at insertion site, patient denies c/o pain - only slight tenderness at site.  Dressing with slight pressure applied.  D/c Instructions-Education: Discharge instructions given to patient/family with verbalized understanding. D/c education completed with patient/family including follow up instructions, medication list, d/c activities limitations if indicated, with other d/c instructions as indicated by MD - patient able to verbalize understanding, all questions fully answered. Patient instructed to return to ED, call 911, or call MD for any changes in condition.  [place] via private auto.  ShSanta LighterRN 07/06/2019 3:59 PM

## 2019-07-06 NOTE — Plan of Care (Signed)
  Problem: Education: Goal: Knowledge of General Education information will improve Description: Including pain rating scale, medication(s)/side effects and non-pharmacologic comfort measures 07/06/2019 1558 by Santa Lighter, RN Outcome: Adequate for Discharge 07/06/2019 1255 by Santa Lighter, RN Outcome: Progressing   Problem: Health Behavior/Discharge Planning: Goal: Ability to manage health-related needs will improve 07/06/2019 1558 by Santa Lighter, RN Outcome: Adequate for Discharge 07/06/2019 1255 by Santa Lighter, RN Outcome: Progressing   Problem: Clinical Measurements: Goal: Ability to maintain clinical measurements within normal limits will improve 07/06/2019 1558 by Santa Lighter, RN Outcome: Adequate for Discharge 07/06/2019 1255 by Santa Lighter, RN Outcome: Progressing Goal: Will remain free from infection Outcome: Adequate for Discharge Goal: Diagnostic test results will improve Outcome: Adequate for Discharge Goal: Respiratory complications will improve Outcome: Adequate for Discharge Goal: Cardiovascular complication will be avoided Outcome: Adequate for Discharge   Problem: Activity: Goal: Risk for activity intolerance will decrease Outcome: Adequate for Discharge   Problem: Nutrition: Goal: Adequate nutrition will be maintained Outcome: Adequate for Discharge   Problem: Coping: Goal: Level of anxiety will decrease Outcome: Adequate for Discharge   Problem: Elimination: Goal: Will not experience complications related to bowel motility Outcome: Adequate for Discharge Goal: Will not experience complications related to urinary retention Outcome: Adequate for Discharge   Problem: Pain Managment: Goal: General experience of comfort will improve Outcome: Adequate for Discharge   Problem: Safety: Goal: Ability to remain free from injury will improve Outcome: Adequate for Discharge   Problem: Skin Integrity: Goal: Risk for impaired skin integrity will  decrease Outcome: Adequate for Discharge   Problem: Education: Goal: Knowledge of disease or condition will improve Outcome: Adequate for Discharge Goal: Knowledge of the prescribed therapeutic regimen will improve Outcome: Adequate for Discharge Goal: Individualized Educational Video(s) Outcome: Adequate for Discharge   Problem: Activity: Goal: Ability to tolerate increased activity will improve Outcome: Adequate for Discharge Goal: Will verbalize the importance of balancing activity with adequate rest periods Outcome: Adequate for Discharge   Problem: Respiratory: Goal: Ability to maintain a clear airway will improve Outcome: Adequate for Discharge Goal: Levels of oxygenation will improve Outcome: Adequate for Discharge Goal: Ability to maintain adequate ventilation will improve Outcome: Adequate for Discharge

## 2019-07-06 NOTE — Plan of Care (Signed)

## 2019-07-06 NOTE — Discharge Summary (Signed)
Physician Discharge Summary  Wesley [name] FVC:[id number] DOB: Aug 02, 1965 DOA: 07/01/2019  PCP: Wesley Burly, MD  Admit date: 07/01/2019 Discharge date: 07/06/2019  Admitted From: Home Disposition: Home   Recommendations for Outpatient Follow-up:  1. Follow up with PCP in 1-2 weeks 2. Please obtain BMP/CBC in one week 3. Follow up with Wesley [name] on 5/10   Barron: Wesley [name] and Wesley [name] Equipment/Devices:  Discharge Condition: stable CODE STATUS: full code Diet recommendation: heart healthy  Brief/Interim Summary: 54 year old male with a history of COPD chronic hypoxic respiratory failure on home oxygen, admitted to the [name] with shortness of breath, cough and wheezing.  He was found to have COPD exacerbation and admitted for IV steroids/antibiotics/bronchodilator therapy.  Discharge Diagnoses:  Active Problems:   Acute-on-chronic respiratory failure (HCC)   Opiate dependence (HCC)   Acute on chronic respiratory failure with hypoxia (HCC)   Acute exacerbation of chronic obstructive pulmonary disease (COPD) ([place])  1. Acute on chronic respiratory failure with hypoxia.  Secondary to COPD exacerbation.  He was treated with intravenous steroids and antibiotics.  COVID-19 is negative.  Pulmonology following, appreciate input. He has improved and appears to be approaching baseline.  At baseline he requires 3 to 5 L of oxygen.  Continue to titrate steroids.  Sputum culture positive for Pseudomonas.  Antibiotics changed to levofloxacin. 2. Chronic back pain/chronic pain syndrome.  Continue on home dose of opiates.  Use Toradol as needed. 3. History of asbestosis.  Continue to follow-up with pulmonology. 4. Tobacco use.  Continue on nicotine patch. 5. Lower extremity edema.  Suspect this is venous stasis. Venous dopplers negative for DVTs. BNP negative and normal EF. Continue on daily lasix  Discharge Instructions  Discharge Instructions    Diet - low sodium heart healthy   Complete by:  As directed    Diet - low sodium heart healthy   Complete by: As directed    Increase activity slowly   Complete by: As directed    Increase activity slowly   Complete by: As directed      Allergies as of 07/06/2019      Reactions   Ambien [zolpidem Tartrate] Other (See Comments)   hallucinations   Melatonin Itching   Zolpidem Other (See Comments)   Altered mental status      Medication List    STOP taking these medications   budesonide-formoterol 160-4.5 MCG/ACT inhaler Commonly known as: SYMBICORT   cephALEXin 500 MG capsule Commonly known as: KEFLEX   tiotropium 18 MCG inhalation capsule Commonly known as: SPIRIVA     TAKE these medications   acetaminophen 325 MG tablet Commonly known as: TYLENOL Take 2 tablets by mouth every 4 (four) hours as needed.   Advair Diskus 250-50 MCG/DOSE Aepb Generic drug: Fluticasone-Salmeterol Inhale 1 puff into the lungs 2 (two) times daily.   albuterol (2.5 MG/3ML) 0.083% nebulizer solution Commonly known as: PROVENTIL Take 3 mLs (2.5 mg total) by nebulization every 6 (six) hours as needed for wheezing or shortness of breath. What changed: when to take this   albuterol 108 (90 Base) MCG/ACT inhaler Commonly known as: VENTOLIN HFA Inhale 2 puffs into the lungs every 4 (four) hours as needed for wheezing or shortness of breath. Shortness of breath What changed: Another medication with the same name was changed. Make sure you understand how and when to take each.   aspirin 81 MG chewable tablet Chew 1 tablet (81 mg total) by mouth daily.   blood glucose meter kit and supplies  Kit Dispense based on patient and insurance preference. Use up to four times daily as directed. (FOR ICD-9 250.00, 250.01).   furosemide 40 MG tablet Commonly known as: LASIX Take 1 tablet (40 mg total) by mouth daily.   guaiFENesin-dextromethorphan 100-10 MG/5ML syrup Commonly known as: ROBITUSSIN DM Take 5 mLs by mouth every 4 (four) hours as needed  for cough.   Incruse Ellipta 62.5 MCG/INH Aepb Generic drug: umeclidinium bromide Inhale 1 puff into the lungs daily.   levofloxacin 500 MG tablet Commonly known as: LEVAQUIN Take 1 tablet (500 mg total) by mouth daily.   loperamide 2 MG capsule Commonly known as: IMODIUM Take 1 capsule by mouth 4 (four) times daily.   montelukast 10 MG tablet Commonly known as: SINGULAIR Take 1 tablet by mouth daily.   morphine 30 MG 12 hr tablet Commonly known as: MS CONTIN Take 30 mg by mouth every 8 (eight) hours.   mupirocin ointment 2 % Commonly known as: BACTROBAN Place 1 application into the nose daily.   Narcan 4 MG/0.1ML Liqd nasal spray kit Generic drug: naloxone Place 4 mg into the nose as directed. To prevent overdose   ondansetron 4 MG disintegrating tablet Commonly known as: ZOFRAN-ODT Take 1 tablet by mouth every 8 (eight) hours as needed.   oxycodone 30 MG immediate release tablet Commonly known as: ROXICODONE Take 30 mg by mouth every 4 (four) hours as needed for pain.   predniSONE 10 MG tablet Commonly known as: DELTASONE Take 31m po daily for 2 days then 441mpo daily for 2 days then 3023maily for 2 days then 32m44mily for 2 days then 10mg17mly for 2 days then stop What changed:   how much to take  how to take this  when to take this  additional instructions   pregabalin 150 MG capsule Commonly known as: LYRICA Take 1 capsule by mouth 3 (three) times daily.   Systane 0.4-0.3 % Soln Generic drug: Polyethyl Glycol-Propyl Glycol Place 1-2 drops into the left eye daily as needed (for dry eye relief).      Follow-up Information    [name],Wesley MiresFollow up on 07/14/2019.   Specialty: Pulmonary Disease Contact information: [id number] [phone]        [name] Schedule an appointment as soon as possible for a visit in 2 week(s).   Specialty: Internal Medicine Contact information: [phone]          Allergies  Allergen Reactions  . Ambien [Zolpidem Tartrate] Other (See Comments)    hallucinations  . Melatonin Itching  . Zolpidem Other (See Comments)    Altered mental status    Consultations:  Pulmonology   Procedures/Studies: CT HEAD WO CONTRAST  Result Date: 06/14/2019 CLINICAL DATA:  54 ye14 old male with bilateral upper extremity weakness, confusion, recent tick bite. EXAM: CT HEAD WITHOUT CONTRAST TECHNIQUE: Contiguous axial images were obtained from the base of the skull through the vertex without intravenous contrast. COMPARISON:  Head CT 04/02/2016. Brain MRI 04/03/2016. FINDINGS: Brain: Cerebral volume remains normal. No midline shift, ventriculomegaly, mass effect, evidence of mass lesion, intracranial hemorrhage or evidence of cortically based acute infarction. Gray-white matter differentiation is within normal limits throughout the brain. Vascular: No suspicious intracranial vascular hyperdensity. Skull: Negative. Sinuses/Orbits: Visualized paranasal sinuses and mastoids are clear. Other: Stable and negative orbit and scalp soft tissues. IMPRESSION: Stable and normal noncontrast CT appearance of  the brain. Electronically Signed   By: Genevie Ann M.D.   On: 06/14/2019 17:28   CT CHEST WO CONTRAST  Result Date: 06/15/2019 CLINICAL DATA:  Shortness of breath x2 weeks, COPD exacerbation, history of left lung tumor status post surgery in 2017, pulmonary asbestosis EXAM: CT CHEST WITHOUT CONTRAST TECHNIQUE: Multidetector CT imaging of the chest was performed following the standard protocol without IV contrast. COMPARISON:  CTA chest dated 01/08/2019 FINDINGS: Cardiovascular: The heart is normal in size. No pericardial effusion. No evidence of thoracic aortic aneurysm. Mild atherosclerotic calcifications of the aortic arch. Mediastinum/Nodes: No suspicious mediastinal lymphadenopathy. Visualized thyroid is unremarkable.  Lungs/Pleura: Severe emphysema with bullous changes at the lung apices. Suspected left upper lobe wedge resection with scarring in the lingula (series 4/image 93). Additional right upper lobe wedge resection (series 4/image 103). No suspicious pulmonary nodules. No focal consolidation. No pleural effusion or pneumothorax. Upper Abdomen: Visualized upper abdomen is grossly unremarkable. Musculoskeletal: Visualized osseous structures are within normal limits. IMPRESSION: No evidence of acute cardiopulmonary disease. Postsurgical changes in the bilateral upper lobes. Aortic Atherosclerosis (ICD10-I70.0) and Emphysema (ICD10-J43.9). Electronically Signed   By: Julian Hy M.D.   On: 06/15/2019 15:49   US Venous Img Lower Bilateral (DVT)  Result Date: 07/06/2019 CLINICAL DATA:  Bilateral lower extremity edema. EXAM: BILATERAL LOWER EXTREMITY VENOUS DOPPLER ULTRASOUND TECHNIQUE: Gray-scale sonography with graded compression, as well as color Doppler and duplex ultrasound were performed to evaluate the lower extremity deep venous systems from the level of the common femoral vein and including the common femoral, femoral, profunda femoral, popliteal and calf veins including the posterior tibial, peroneal and gastrocnemius veins when visible. The superficial great saphenous vein was also interrogated. Spectral Doppler was utilized to evaluate flow at rest and with distal augmentation maneuvers in the common femoral, femoral and popliteal veins. COMPARISON:  None. FINDINGS: RIGHT LOWER EXTREMITY Common Femoral Vein: No evidence of thrombus. Normal compressibility, respiratory phasicity and response to augmentation. Saphenofemoral Junction: No evidence of thrombus. Normal compressibility and flow on color Doppler imaging. Profunda Femoral Vein: No evidence of thrombus. Normal compressibility and flow on color Doppler imaging. Femoral Vein: No evidence of thrombus. Normal compressibility, respiratory phasicity and  response to augmentation. Popliteal Vein: No evidence of thrombus. Normal compressibility, respiratory phasicity and response to augmentation. Calf Veins: No evidence of thrombus. Normal compressibility and flow on color Doppler imaging. Superficial Great Saphenous Vein: No evidence of thrombus. Normal compressibility. Venous Reflux:  None. Other Findings:  None. LEFT LOWER EXTREMITY Common Femoral Vein: No evidence of thrombus. Normal compressibility, respiratory phasicity and response to augmentation. Saphenofemoral Junction: No evidence of thrombus. Normal compressibility and flow on color Doppler imaging. Profunda Femoral Vein: No evidence of thrombus. Normal compressibility and flow on color Doppler imaging. Femoral Vein: No evidence of thrombus. Normal compressibility, respiratory phasicity and response to augmentation. Popliteal Vein: No evidence of thrombus. Normal compressibility, respiratory phasicity and response to augmentation. Calf Veins: No evidence of thrombus. Normal compressibility and flow on color Doppler imaging. Superficial Great Saphenous Vein: No evidence of thrombus. Normal compressibility. Venous Reflux:  None. Other Findings: No evidence of superficial thrombophlebitis or abnormal fluid collection. IMPRESSION: No evidence of deep venous thrombosis in either lower extremity. Electronically Signed   By: Aletta Edouard M.D.   On: 07/06/2019 10:15   US Venous Img Lower Bilateral (DVT)  Result Date: 06/16/2019 CLINICAL DATA:  Acute bilateral lower extremity pain. EXAM: Bilateral LOWER EXTREMITY VENOUS DOPPLER ULTRASOUND TECHNIQUE: Gray-scale sonography with compression, as well as  color and duplex ultrasound, were performed to evaluate the deep venous system(s) from the level of the common femoral vein through the popliteal and proximal calf veins. COMPARISON:  None. FINDINGS: VENOUS Normal compressibility of the common femoral, superficial femoral, and popliteal veins, as well as the  visualized calf veins. Visualized portions of profunda femoral vein and great saphenous vein unremarkable. No filling defects to suggest DVT on grayscale or color Doppler imaging. Doppler waveforms show normal direction of venous flow, normal respiratory phasicity and response to augmentation. Limited views of the contralateral common femoral vein are unremarkable. OTHER None. Limitations: none IMPRESSION: No femoropopliteal DVT nor evidence of DVT within the visualized calf veins. If clinical symptoms are inconsistent or if there are persistent or worsening symptoms, further imaging (possibly involving the iliac veins) may be warranted. Electronically Signed   By: Marijo Conception M.D.   On: 06/16/2019 10:09   DG Chest Portable 1 View  Result Date: 07/01/2019 CLINICAL DATA:  Shortness of breath. Hypoxia. EXAM: PORTABLE CHEST 1 VIEW COMPARISON:  CT chest 06/15/2019. One-view chest x-ray 06/13/2019. FINDINGS: Heart size is normal. Aortic atherosclerosis is again seen. Changes of severe COPD are evident. Scarring the left lung is stable. No acute superimposed disease is present. IMPRESSION: 1. Severe COPD. 2. No acute cardiopulmonary disease or significant interval change. Electronically Signed   By: San Morelle M.D.   On: 07/01/2019 11:44   DG Chest Portable 1 View  Result Date: 06/13/2019 CLINICAL DATA:  Shortness of breath. EXAM: PORTABLE CHEST 1 VIEW COMPARISON:  01/08/2019.  01/17/2017. FINDINGS: Mediastinum and hilar structures normal. Heart size normal. No focal infiltrate. COPD. Stable left base pleuroparenchymal thickening consistent with scarring. Stable elevation left hemidiaphragm. No acute bony abnormality. IMPRESSION: No focal infiltrate. COPD. Stable left base pleuroparenchymal thickening consistent with scarring. Electronically Signed   By: Marcello Moores  Register   On: 06/13/2019 09:39   EEG adult  Result Date: 06/16/2019 Lora Havens, MD     06/16/2019  4:56 PM Patient Name: Wesley [name] MRN: [id number] Epilepsy Attending: Lora Havens Referring Physician/Provider: Dr. Shanon Brow Tat Date: 06/16/2019 Duration: 23.23 mins Patient history: 54 year old male admitted for shortness of breath.  Also noted to have intermittent bilateral hand weakness occasionally accompanied by confusion.  EEG to evaluate for seizures. Level of alertness: Awake AEDs during EEG study: Pregabalin Technical aspects: This EEG study was done with scalp electrodes positioned according to the 10-20 International system of electrode placement. Electrical activity was acquired at a sampling rate of 500Hz  and reviewed with a high frequency filter of 70Hz  and a low frequency filter of 1Hz . EEG data were recorded continuously and digitally stored. Description: No clear posterior dominant rhythm was seen. EEG showed continuous generalized polymorphic 3-6hz  theta-delta slowing. Hyperventilation and photic stimulation were not performed. ABNORMALITY - Continuous slow, generalized IMPRESSION: This study is suggestive of moderate diffuse encephalopathy, non specific to etiology. No seizures or epileptiform discharges were seen throughout the recording. Lora Havens   ECHOCARDIOGRAM COMPLETE  Result Date: 06/16/2019    ECHOCARDIOGRAM REPORT   Patient Name:   Wesley [name] Date of Exam: 06/16/2019 Medical Rec #:  [id number]        Height:       73.0 in Accession #:    [id number]       Weight:       181.0 lb Date of Birth:  [birth year]        BSA:  2.062 m Patient Age:    28 years         BP:           141/71 mmHg Patient Gender: M                HR:           94 bpm. Exam Location:  [facility] Procedure: 2D Echo Indications:    Dyspnea 786.09 / R06.00  History:        Patient has prior history of Echocardiogram examinations, most                 recent 07/24/2011. COPD, Signs/Symptoms:Chest Pain and Fever;                 Risk Factors:Current Smoker. Acute coronary syndrome , History                 of DVT , Pulmonary  infection, Opiate abuse.  Sonographer:    Leavy Cella RDCS (AE) Referring Phys: [phone] DAVID TAT IMPRESSIONS  1. Left ventricular ejection fraction, by estimation, is 65 to 70%. The left ventricle has normal function. The left ventricle has no regional wall motion abnormalities. Left ventricular diastolic parameters are indeterminate.  2. Right ventricular systolic function is normal. The right ventricular size is normal. Tricuspid regurgitation signal is inadequate for assessing PA pressure.  3. The mitral valve is grossly normal. Trivial mitral valve regurgitation.  4. The aortic valve is tricuspid. Aortic valve regurgitation is not visualized.  5. The inferior vena cava is normal in size with greater than 50% respiratory variability, suggesting right atrial pressure of 3 mmHg. FINDINGS  Left Ventricle: Left ventricular ejection fraction, by estimation, is 65 to 70%. The left ventricle has normal function. The left ventricle has no regional wall motion abnormalities. The left ventricular internal cavity size was normal in size. There is  no left ventricular hypertrophy. Left ventricular diastolic parameters are indeterminate. Right Ventricle: The right ventricular size is normal. No increase in right ventricular wall thickness. Right ventricular systolic function is normal. Tricuspid regurgitation signal is inadequate for assessing PA pressure. Left Atrium: Left atrial size was normal in size. Right Atrium: Right atrial size was normal in size. Pericardium: There is no evidence of pericardial effusion. Presence of pericardial fat pad. Mitral Valve: The mitral valve is grossly normal. Trivial mitral valve regurgitation. Tricuspid Valve: The tricuspid valve is grossly normal. Tricuspid valve regurgitation is trivial. Aortic Valve: The aortic valve is tricuspid. Aortic valve regurgitation is not visualized. Mild aortic valve annular calcification. Pulmonic Valve: The pulmonic valve was not well visualized. Pulmonic  valve regurgitation is not visualized. Aorta: The aortic root is normal in size and structure. Venous: The inferior vena cava is normal in size with greater than 50% respiratory variability, suggesting right atrial pressure of 3 mmHg. IAS/Shunts: No atrial level shunt detected by color flow Doppler.  LEFT VENTRICLE PLAX 2D LVIDd:         4.66 cm  Diastology LVIDs:         2.85 cm  LV e' lateral:   9.64 cm/s LV PW:         1.01 cm  LV E/e' lateral: 10.1 LV IVS:        0.93 cm  LV e' medial:    13.20 cm/s LVOT diam:     2.10 cm  LV E/e' medial:  7.4 LV SV:         77 LV SV Index:  37 LVOT Area:     3.46 cm  RIGHT VENTRICLE RV S prime:     24.20 cm/s TAPSE (M-mode): 2.6 cm LEFT ATRIUM             Index LA diam:        3.30 cm 1.60 cm/m LA Vol (A2C):   30.3 ml 14.69 ml/m LA Vol (A4C):   47.3 ml 22.94 ml/m LA Biplane Vol: 38.0 ml 18.43 ml/m  AORTIC VALVE LVOT Vmax:   120.00 cm/s LVOT Vmean:  75.400 cm/s LVOT VTI:    0.222 m  AORTA Ao Root diam: 3.00 cm MITRAL VALVE MV Area (PHT): 2.75 cm    SHUNTS MV Decel Time: 276 msec    Systemic VTI:  0.22 m MV E velocity: 97.30 cm/s  Systemic Diam: 2.10 cm MV A velocity: 49.60 cm/s MV E/A ratio:  1.96 Rozann Lesches MD Electronically signed by Rozann Lesches MD Signature Date/Time: 06/16/2019/11:49:28 AM    Final        Subjective: Has productive cough.  Says he still gets short of breath on ambulation.  Says he is still very short of breath, but able to carry on a conversation without difficulty.  Discharge Exam: Vitals:   07/06/19 0146 07/06/19 0524 07/06/19 0806 07/06/19 1424  BP:  112/74    Pulse:  87    Resp:  18    Temp:  98 F (36.7 C)    TempSrc:  Oral    SpO2: 97% 98% 96% 95%  Weight:      Height:        General: Pt is alert, awake, not in acute distress Cardiovascular: RRR, S1/S2 +, no rubs, no gallops Respiratory: Mild wheeze bilaterally on forced expiration Abdominal: Soft, NT, ND, bowel sounds + Extremities: no edema, no  cyanosis    The results of significant diagnostics from this hospitalization (including imaging, microbiology, ancillary and laboratory) are listed below for reference.     Microbiology: Recent Results (from the past 240 hour(s))  Respiratory Panel by RT PCR (Flu A&B, Covid) - Nasopharyngeal Swab     Status: None   Collection Time: 07/01/19 11:25 AM   Specimen: Nasopharyngeal Swab  Result Value Ref Range Status   SARS Coronavirus 2 by RT PCR NEGATIVE NEGATIVE Final    Comment: (NOTE) SARS-CoV-2 target nucleic acids are NOT DETECTED. The SARS-CoV-2 RNA is generally detectable in upper respiratoy specimens during the acute phase of infection. The lowest concentration of SARS-CoV-2 viral copies this assay can detect is 131 copies/mL. A negative result does not preclude SARS-Cov-2 infection and should not be used as the sole basis for treatment or other patient management decisions. A negative result may occur with  improper specimen collection/handling, submission of specimen other than nasopharyngeal swab, presence of viral mutation(s) within the areas targeted by this assay, and inadequate number of viral copies (<131 copies/mL). A negative result must be combined with clinical observations, patient history, and epidemiological information. The expected result is Negative. Fact Sheet for Patients:  PinkCheek.be Fact Sheet for Healthcare Providers:  GravelBags.it This test is not yet ap proved or cleared by the [place] FDA and  has been authorized for detection and/or diagnosis of SARS-CoV-2 by FDA under an Emergency Use Authorization (EUA). This EUA will remain  in effect (meaning this test can be used) for the duration of the COVID-19 declaration under Section 564(b)(1) of the Act, 21 U.S.C. section 360bbb-3(b)(1), unless the authorization is terminated or revoked sooner.  Influenza A by PCR NEGATIVE NEGATIVE  Final   Influenza B by PCR NEGATIVE NEGATIVE Final    Comment: (NOTE) The Xpert Xpress SARS-CoV-2/FLU/RSV assay is intended as an aid in  the diagnosis of influenza from Nasopharyngeal swab specimens and  should not be used as a sole basis for treatment. Nasal washings and  aspirates are unacceptable for Xpert Xpress SARS-CoV-2/FLU/RSV  testing. Fact Sheet for Patients: PinkCheek.be Fact Sheet for Healthcare Providers: GravelBags.it This test is not yet approved or cleared by the [place] FDA and  has been authorized for detection and/or diagnosis of SARS-CoV-2 by  FDA under an Emergency Use Authorization (EUA). This EUA will remain  in effect (meaning this test can be used) for the duration of the  Covid-19 declaration under Section 564(b)(1) of the Act, 21  U.S.C. section 360bbb-3(b)(1), unless the authorization is  terminated or revoked. Performed at [facility], [address] [place], [place] [address]   MRSA PCR Screening     Status: None   Collection Time: 07/01/19  3:47 PM   Specimen: Nasal Mucosa; Nasopharyngeal  Result Value Ref Range Status   MRSA by PCR NEGATIVE NEGATIVE Final    Comment:        The GeneXpert MRSA Assay (FDA approved for NASAL specimens only), is one component of a comprehensive MRSA colonization surveillance program. It is not intended to diagnose MRSA infection nor to guide or monitor treatment for MRSA infections. Performed at [facility], [address], [place], [place] [address]   Expectorated sputum assessment w rflx to resp cult     Status: None   Collection Time: 07/03/19  5:18 AM   Specimen: Expectorated Sputum  Result Value Ref Range Status   Specimen Description EXPECTORATED SPUTUM  Final   Special Requests NONE  Final   Sputum evaluation   Final    THIS SPECIMEN IS ACCEPTABLE FOR SPUTUM CULTURE Performed at [facility], [address] [place], [place] [address]     Report Status 07/03/2019 FINAL  Final  Culture, respiratory     Status: None   Collection Time: 07/03/19  5:18 AM  Result Value Ref Range Status   Specimen Description   Final    EXPECTORATED SPUTUM Performed at [facility], [address] [place], [place] [address]    Special Requests   Final    NONE Reflexed from [id number] Performed at [facility], [address] [place], [place] [address]    Gram Stain   Final    ABUNDANT WBC PRESENT,BOTH PMN AND MONONUCLEAR FEW GRAM POSITIVE COCCI RARE GRAM NEGATIVE RODS    Culture FEW PSEUDOMONAS AERUGINOSA  Final   Report Status 07/05/2019 FINAL  Final   Organism ID, Bacteria PSEUDOMONAS AERUGINOSA  Final      Susceptibility   Pseudomonas aeruginosa - MIC*    CEFTAZIDIME 4 SENSITIVE Sensitive     CIPROFLOXACIN <=0.25 SENSITIVE Sensitive     GENTAMICIN 2 SENSITIVE Sensitive     IMIPENEM 2 SENSITIVE Sensitive     PIP/TAZO 8 SENSITIVE Sensitive     CEFEPIME 2 SENSITIVE Sensitive     * FEW PSEUDOMONAS AERUGINOSA     Labs: BNP (last 3 results) Recent Labs    01/08/19 1250 06/13/19 0948 07/01/19 1103  BNP 21.0 25.0 01.7   Basic Metabolic Panel: Recent Labs  Lab 07/01/19 1103 07/02/19 0402  NA 137 136  K 3.8 4.2  CL 95* 97*  CO2 32 27  GLUCOSE 144* 271*  BUN 17 17  CREATININE 0.83 0.83  CALCIUM 9.2 9.0  MG  --  2.1   Liver Function Tests: No results for input(s): AST, ALT, ALKPHOS, BILITOT, PROT, ALBUMIN in the last 168 hours. No results for input(s): LIPASE, AMYLASE in the last 168 hours. No results for input(s): AMMONIA in the last 168 hours. CBC: Recent Labs  Lab 07/01/19 1103 07/02/19 0402  WBC 16.3* 11.8*  HGB 15.1 12.2*  HCT 49.4 40.5  MCV 99.8 100.2*  PLT 275 242   Cardiac Enzymes: No results for input(s): CKTOTAL, CKMB, CKMBINDEX, TROPONINI in the last 168 hours. BNP: Invalid input(s): POCBNP CBG: Recent Labs  Lab 07/05/19 1636 07/05/19 2127 07/06/19 0001 07/06/19 0801 07/06/19 1140  GLUCAP 174*  90 124* 73 140*   D-Dimer No results for input(s): DDIMER in the last 72 hours. Hgb A1c No results for input(s): HGBA1C in the last 72 hours. Lipid Profile No results for input(s): CHOL, HDL, LDLCALC, TRIG, CHOLHDL, LDLDIRECT in the last 72 hours. Thyroid function studies No results for input(s): TSH, T4TOTAL, T3FREE, THYROIDAB in the last 72 hours.  Invalid input(s): FREET3 Anemia work up No results for input(s): VITAMINB12, FOLATE, FERRITIN, TIBC, IRON, RETICCTPCT in the last 72 hours. Urinalysis    Component Value Date/Time   COLORURINE STRAW (A) 06/14/2019 1508   APPEARANCEUR CLEAR 06/14/2019 1508   LABSPEC 1.009 06/14/2019 1508   PHURINE 6.0 06/14/2019 1508   GLUCOSEU 50 (A) 06/14/2019 1508   HGBUR NEGATIVE 06/14/2019 1508   BILIRUBINUR NEGATIVE 06/14/2019 1508   KETONESUR NEGATIVE 06/14/2019 1508   PROTEINUR NEGATIVE 06/14/2019 1508   UROBILINOGEN 0.2 05/23/2014 0102   NITRITE NEGATIVE 06/14/2019 1508   LEUKOCYTESUR NEGATIVE 06/14/2019 1508   Sepsis Labs Invalid input(s): PROCALCITONIN,  WBC,  LACTICIDVEN Microbiology Recent Results (from the past 240 hour(s))  Respiratory Panel by RT PCR (Flu A&B, Covid) - Nasopharyngeal Swab     Status: None   Collection Time: 07/01/19 11:25 AM   Specimen: Nasopharyngeal Swab  Result Value Ref Range Status   SARS Coronavirus 2 by RT PCR NEGATIVE NEGATIVE Final    Comment: (NOTE) SARS-CoV-2 target nucleic acids are NOT DETECTED. The SARS-CoV-2 RNA is generally detectable in upper respiratoy specimens during the acute phase of infection. The lowest concentration of SARS-CoV-2 viral copies this assay can detect is 131 copies/mL. A negative result does not preclude SARS-Cov-2 infection and should not be used as the sole basis for treatment or other patient management decisions. A negative result may occur with  improper specimen collection/handling, submission of specimen other than nasopharyngeal swab, presence of viral  mutation(s) within the areas targeted by this assay, and inadequate number of viral copies (<131 copies/mL). A negative result must be combined with clinical observations, patient history, and epidemiological information. The expected result is Negative. Fact Sheet for Patients:  PinkCheek.be Fact Sheet for Healthcare Providers:  GravelBags.it This test is not yet ap proved or cleared by the [place] FDA and  has been authorized for detection and/or diagnosis of SARS-CoV-2 by FDA under an Emergency Use Authorization (EUA). This EUA will remain  in effect (meaning this test can be used) for the duration of the COVID-19 declaration under Section 564(b)(1) of the Act, 21 U.S.C. section 360bbb-3(b)(1), unless the authorization is terminated or revoked sooner.    Influenza A by PCR NEGATIVE NEGATIVE Final   Influenza B by PCR NEGATIVE NEGATIVE Final    Comment: (NOTE) The Xpert Xpress SARS-CoV-2/FLU/RSV assay is intended as an aid in  the diagnosis of influenza  from Nasopharyngeal swab specimens and  should not be used as a sole basis for treatment. Nasal washings and  aspirates are unacceptable for Xpert Xpress SARS-CoV-2/FLU/RSV  testing. Fact Sheet for Patients: PinkCheek.be Fact Sheet for Healthcare Providers: GravelBags.it This test is not yet approved or cleared by the [place] FDA and  has been authorized for detection and/or diagnosis of SARS-CoV-2 by  FDA under an Emergency Use Authorization (EUA). This EUA will remain  in effect (meaning this test can be used) for the duration of the  Covid-19 declaration under Section 564(b)(1) of the Act, 21  U.S.C. section 360bbb-3(b)(1), unless the authorization is  terminated or revoked. Performed at [name], [address], [place], [place] [address]   MRSA PCR Screening     Status: None   Collection Time:  07/01/19  3:47 PM   Specimen: Nasal Mucosa; Nasopharyngeal  Result Value Ref Range Status   MRSA by PCR NEGATIVE NEGATIVE Final    Comment:        The GeneXpert MRSA Assay (FDA approved for NASAL specimens only), is one component of a comprehensive MRSA colonization surveillance program. It is not intended to diagnose MRSA infection nor to guide or monitor treatment for MRSA infections. Performed at [facility], [address], [place], [place] [address]   Expectorated sputum assessment w rflx to resp cult     Status: None   Collection Time: 07/03/19  5:18 AM   Specimen: Expectorated Sputum  Result Value Ref Range Status   Specimen Description EXPECTORATED SPUTUM  Final   Special Requests NONE  Final   Sputum evaluation   Final    THIS SPECIMEN IS ACCEPTABLE FOR SPUTUM CULTURE Performed at [name], [address] [place], [place] [address]    Report Status 07/03/2019 FINAL  Final  Culture, respiratory     Status: None   Collection Time: 07/03/19  5:18 AM  Result Value Ref Range Status   Specimen Description   Final    EXPECTORATED SPUTUM Performed at [name], [address] [place], [place] [address]    Special Requests   Final    NONE Reflexed from [id number] Performed at [facility], [address], [place], [place] [address]    Gram Stain   Final    ABUNDANT WBC PRESENT,BOTH PMN AND MONONUCLEAR FEW GRAM POSITIVE COCCI RARE GRAM NEGATIVE RODS    Culture FEW PSEUDOMONAS AERUGINOSA  Final   Report Status 07/05/2019 FINAL  Final   Organism ID, Bacteria PSEUDOMONAS AERUGINOSA  Final      Susceptibility   Pseudomonas aeruginosa - MIC*    CEFTAZIDIME 4 SENSITIVE Sensitive     CIPROFLOXACIN <=0.25 SENSITIVE Sensitive     GENTAMICIN 2 SENSITIVE Sensitive     IMIPENEM 2 SENSITIVE Sensitive     PIP/TAZO 8 SENSITIVE Sensitive     CEFEPIME 2 SENSITIVE Sensitive     * FEW PSEUDOMONAS AERUGINOSA     Time coordinating discharge: 59mns  SIGNED:   JKathie Dike MD  [facility] 07/06/2019, 8:38 PM   If 7PM-7AM, please contact night-coverage [email or web address]

## 2019-07-07 DIAGNOSIS — M25561 Pain in right knee: Secondary | ICD-10-CM | POA: Diagnosis not present

## 2019-07-07 DIAGNOSIS — M25551 Pain in right hip: Secondary | ICD-10-CM | POA: Diagnosis not present

## 2019-07-07 DIAGNOSIS — Z5181 Encounter for therapeutic drug level monitoring: Secondary | ICD-10-CM | POA: Diagnosis not present

## 2019-07-07 DIAGNOSIS — Z79899 Other long term (current) drug therapy: Secondary | ICD-10-CM | POA: Diagnosis not present

## 2019-07-07 DIAGNOSIS — G894 Chronic pain syndrome: Secondary | ICD-10-CM | POA: Diagnosis not present

## 2019-07-07 DIAGNOSIS — G8929 Other chronic pain: Secondary | ICD-10-CM | POA: Diagnosis not present

## 2019-07-07 DIAGNOSIS — G8922 Chronic post-thoracotomy pain: Secondary | ICD-10-CM | POA: Diagnosis not present

## 2019-07-07 DIAGNOSIS — Z79891 Long term (current) use of opiate analgesic: Secondary | ICD-10-CM | POA: Diagnosis not present

## 2019-07-12 DIAGNOSIS — R269 Unspecified abnormalities of gait and mobility: Secondary | ICD-10-CM | POA: Diagnosis not present

## 2019-07-12 DIAGNOSIS — J441 Chronic obstructive pulmonary disease with (acute) exacerbation: Secondary | ICD-10-CM | POA: Diagnosis not present

## 2019-07-12 DIAGNOSIS — I279 Pulmonary heart disease, unspecified: Secondary | ICD-10-CM | POA: Diagnosis not present

## 2019-07-14 ENCOUNTER — Other Ambulatory Visit: Payer: Self-pay

## 2019-07-14 ENCOUNTER — Emergency Department (HOSPITAL_COMMUNITY): Payer: Medicare HMO

## 2019-07-14 ENCOUNTER — Encounter: Payer: Self-pay | Admitting: Pulmonary Disease

## 2019-07-14 ENCOUNTER — Ambulatory Visit (INDEPENDENT_AMBULATORY_CARE_PROVIDER_SITE_OTHER): Payer: Medicare HMO | Admitting: Pulmonary Disease

## 2019-07-14 ENCOUNTER — Encounter (HOSPITAL_COMMUNITY): Payer: Self-pay | Admitting: Emergency Medicine

## 2019-07-14 ENCOUNTER — Inpatient Hospital Stay (HOSPITAL_COMMUNITY)
Admission: EM | Admit: 2019-07-14 | Discharge: 2019-07-18 | DRG: 202 | Disposition: A | Discharge status: Home Health | Payer: Medicare HMO | Attending: Family Medicine | Admitting: Family Medicine

## 2019-07-14 VITALS — BP 140/70 | HR 107 | Temp 97.1°F | Ht 73.0 in | Wt 189.0 lb

## 2019-07-14 DIAGNOSIS — Z8249 Family history of ischemic heart disease and other diseases of the circulatory system: Secondary | ICD-10-CM

## 2019-07-14 DIAGNOSIS — R058 Other specified cough: Secondary | ICD-10-CM | POA: Insufficient documentation

## 2019-07-14 DIAGNOSIS — E1122 Type 2 diabetes mellitus with diabetic chronic kidney disease: Secondary | ICD-10-CM | POA: Diagnosis present

## 2019-07-14 DIAGNOSIS — Z885 Allergy status to narcotic agent status: Secondary | ICD-10-CM

## 2019-07-14 DIAGNOSIS — I130 Hypertensive heart and chronic kidney disease with heart failure and stage 1 through stage 4 chronic kidney disease, or unspecified chronic kidney disease: Secondary | ICD-10-CM | POA: Diagnosis not present

## 2019-07-14 DIAGNOSIS — R0602 Shortness of breath: Secondary | ICD-10-CM | POA: Diagnosis not present

## 2019-07-14 DIAGNOSIS — M549 Dorsalgia, unspecified: Secondary | ICD-10-CM | POA: Diagnosis present

## 2019-07-14 DIAGNOSIS — J208 Acute bronchitis due to other specified organisms: Principal | ICD-10-CM | POA: Diagnosis present

## 2019-07-14 DIAGNOSIS — J9621 Acute and chronic respiratory failure with hypoxia: Secondary | ICD-10-CM

## 2019-07-14 DIAGNOSIS — Z7982 Long term (current) use of aspirin: Secondary | ICD-10-CM

## 2019-07-14 DIAGNOSIS — J9622 Acute and chronic respiratory failure with hypercapnia: Secondary | ICD-10-CM | POA: Diagnosis not present

## 2019-07-14 DIAGNOSIS — J439 Emphysema, unspecified: Secondary | ICD-10-CM | POA: Diagnosis present

## 2019-07-14 DIAGNOSIS — Z801 Family history of malignant neoplasm of trachea, bronchus and lung: Secondary | ICD-10-CM

## 2019-07-14 DIAGNOSIS — T380X5A Adverse effect of glucocorticoids and synthetic analogues, initial encounter: Secondary | ICD-10-CM | POA: Diagnosis not present

## 2019-07-14 DIAGNOSIS — Z887 Allergy status to serum and vaccine status: Secondary | ICD-10-CM

## 2019-07-14 DIAGNOSIS — G894 Chronic pain syndrome: Secondary | ICD-10-CM | POA: Diagnosis present

## 2019-07-14 DIAGNOSIS — Z951 Presence of aortocoronary bypass graft: Secondary | ICD-10-CM

## 2019-07-14 DIAGNOSIS — K5900 Constipation, unspecified: Secondary | ICD-10-CM | POA: Diagnosis present

## 2019-07-14 DIAGNOSIS — J441 Chronic obstructive pulmonary disease with (acute) exacerbation: Secondary | ICD-10-CM | POA: Diagnosis present

## 2019-07-14 DIAGNOSIS — R69 Illness, unspecified: Secondary | ICD-10-CM | POA: Diagnosis not present

## 2019-07-14 DIAGNOSIS — Z7951 Long term (current) use of inhaled steroids: Secondary | ICD-10-CM | POA: Diagnosis not present

## 2019-07-14 DIAGNOSIS — R05 Cough: Secondary | ICD-10-CM

## 2019-07-14 DIAGNOSIS — F1120 Opioid dependence, uncomplicated: Secondary | ICD-10-CM | POA: Diagnosis present

## 2019-07-14 DIAGNOSIS — J9610 Chronic respiratory failure, unspecified whether with hypoxia or hypercapnia: Secondary | ICD-10-CM | POA: Diagnosis present

## 2019-07-14 DIAGNOSIS — Z77090 Contact with and (suspected) exposure to asbestos: Secondary | ICD-10-CM | POA: Diagnosis present

## 2019-07-14 DIAGNOSIS — E785 Hyperlipidemia, unspecified: Secondary | ICD-10-CM | POA: Diagnosis present

## 2019-07-14 DIAGNOSIS — Z86718 Personal history of other venous thrombosis and embolism: Secondary | ICD-10-CM

## 2019-07-14 DIAGNOSIS — M810 Age-related osteoporosis without current pathological fracture: Secondary | ICD-10-CM | POA: Diagnosis present

## 2019-07-14 DIAGNOSIS — N181 Chronic kidney disease, stage 1: Secondary | ICD-10-CM | POA: Diagnosis present

## 2019-07-14 DIAGNOSIS — G629 Polyneuropathy, unspecified: Secondary | ICD-10-CM | POA: Diagnosis present

## 2019-07-14 DIAGNOSIS — Z20822 Contact with and (suspected) exposure to covid-19: Secondary | ICD-10-CM | POA: Diagnosis not present

## 2019-07-14 DIAGNOSIS — Z8261 Family history of arthritis: Secondary | ICD-10-CM

## 2019-07-14 DIAGNOSIS — Z833 Family history of diabetes mellitus: Secondary | ICD-10-CM

## 2019-07-14 DIAGNOSIS — I878 Other specified disorders of veins: Secondary | ICD-10-CM | POA: Diagnosis present

## 2019-07-14 DIAGNOSIS — K219 Gastro-esophageal reflux disease without esophagitis: Secondary | ICD-10-CM | POA: Diagnosis present

## 2019-07-14 DIAGNOSIS — Z803 Family history of malignant neoplasm of breast: Secondary | ICD-10-CM

## 2019-07-14 DIAGNOSIS — I5030 Unspecified diastolic (congestive) heart failure: Secondary | ICD-10-CM | POA: Diagnosis present

## 2019-07-14 DIAGNOSIS — Z981 Arthrodesis status: Secondary | ICD-10-CM

## 2019-07-14 DIAGNOSIS — Z96641 Presence of right artificial hip joint: Secondary | ICD-10-CM | POA: Diagnosis present

## 2019-07-14 DIAGNOSIS — Z800 Family history of malignant neoplasm of digestive organs: Secondary | ICD-10-CM

## 2019-07-14 DIAGNOSIS — Z794 Long term (current) use of insulin: Secondary | ICD-10-CM

## 2019-07-14 DIAGNOSIS — Z87891 Personal history of nicotine dependence: Secondary | ICD-10-CM | POA: Diagnosis not present

## 2019-07-14 DIAGNOSIS — I2781 Cor pulmonale (chronic): Secondary | ICD-10-CM | POA: Diagnosis present

## 2019-07-14 DIAGNOSIS — E1165 Type 2 diabetes mellitus with hyperglycemia: Secondary | ICD-10-CM | POA: Diagnosis not present

## 2019-07-14 DIAGNOSIS — Z9981 Dependence on supplemental oxygen: Secondary | ICD-10-CM

## 2019-07-14 DIAGNOSIS — Z91018 Allergy to other foods: Secondary | ICD-10-CM

## 2019-07-14 DIAGNOSIS — Z91040 Latex allergy status: Secondary | ICD-10-CM

## 2019-07-14 DIAGNOSIS — I2510 Atherosclerotic heart disease of native coronary artery without angina pectoris: Secondary | ICD-10-CM | POA: Diagnosis present

## 2019-07-14 LAB — COMPREHENSIVE METABOLIC PANEL
ALT: 29 U/L (ref 0–44)
AST: 16 U/L (ref 15–41)
Albumin: 3.5 g/dL (ref 3.5–5.0)
Alkaline Phosphatase: 63 U/L (ref 38–126)
Anion gap: 10 (ref 5–15)
BUN: 18 mg/dL (ref 6–20)
CO2: 39 mmol/L — ABNORMAL HIGH (ref 22–32)
Calcium: 9.1 mg/dL (ref 8.9–10.3)
Chloride: 91 mmol/L — ABNORMAL LOW (ref 98–111)
Creatinine, Ser: 0.81 mg/dL (ref 0.61–1.24)
GFR calc Af Amer: >60 mL/min (ref >60)
GFR calc non Af Amer: >60 mL/min (ref >60)
Glucose, Bld: 117 mg/dL — ABNORMAL HIGH (ref 70–99)
Potassium: 3.7 mmol/L (ref 3.5–5.1)
Sodium: 140 mmol/L (ref 135–145)
Total Bilirubin: 0.4 mg/dL (ref 0.3–1.2)
Total Protein: 6.4 g/dL — ABNORMAL LOW (ref 6.5–8.1)

## 2019-07-14 LAB — SARS CORONAVIRUS 2 BY RT PCR (HOSPITAL ORDER, PERFORMED IN ~~LOC~~ HOSPITAL LAB): SARS Coronavirus 2: NEGATIVE

## 2019-07-14 LAB — CBC
HCT: 38.8 % — ABNORMAL LOW (ref 39.0–52.0)
Hemoglobin: 11.6 g/dL — ABNORMAL LOW (ref 13.0–17.0)
MCH: 30.4 pg (ref 26.0–34.0)
MCHC: 29.9 g/dL — ABNORMAL LOW (ref 30.0–36.0)
MCV: 101.8 fL — ABNORMAL HIGH (ref 80.0–100.0)
Platelets: 382 10*3/uL (ref 150–400)
RBC: 3.81 MIL/uL — ABNORMAL LOW (ref 4.22–5.81)
RDW: 13.6 % (ref 11.5–15.5)
WBC: 16.1 10*3/uL — ABNORMAL HIGH (ref 4.0–10.5)
nRBC: 0 % (ref 0.0–0.2)

## 2019-07-14 LAB — BRAIN NATRIURETIC PEPTIDE: B Natriuretic Peptide: 38 pg/mL (ref 0.0–100.0)

## 2019-07-14 LAB — GLUCOSE, CAPILLARY: Glucose-Capillary: 158 mg/dL — ABNORMAL HIGH (ref 70–99)

## 2019-07-14 MED ORDER — ASPIRIN 81 MG PO CHEW
81.0000 mg | CHEWABLE_TABLET | Freq: Every day | ORAL | Status: DC
  Administered 2019-07-15 – 2019-07-18 (×4): 81 mg via ORAL
  Filled 2019-07-14 (×4): qty 1

## 2019-07-14 MED ORDER — GUAIFENESIN-DM 100-10 MG/5ML PO SYRP
5.0000 mL | ORAL_SOLUTION | ORAL | Status: DC | PRN
  Administered 2019-07-14 – 2019-07-18 (×9): 5 mL via ORAL
  Filled 2019-07-14 (×9): qty 5

## 2019-07-14 MED ORDER — NICOTINE 14 MG/24HR TD PT24
14.0000 mg | MEDICATED_PATCH | Freq: Every day | TRANSDERMAL | Status: DC
  Administered 2019-07-14 – 2019-07-18 (×5): 14 mg via TRANSDERMAL
  Filled 2019-07-14 (×5): qty 1

## 2019-07-14 MED ORDER — VANCOMYCIN HCL 1250 MG/250ML IV SOLN
1250.0000 mg | Freq: Two times a day (BID) | INTRAVENOUS | Status: DC
  Administered 2019-07-15 – 2019-07-16 (×3): 1250 mg via INTRAVENOUS
  Filled 2019-07-14 (×3): qty 250

## 2019-07-14 MED ORDER — IPRATROPIUM-ALBUTEROL 0.5-2.5 (3) MG/3ML IN SOLN
3.0000 mL | Freq: Four times a day (QID) | RESPIRATORY_TRACT | Status: DC
  Administered 2019-07-14 – 2019-07-16 (×7): 3 mL via RESPIRATORY_TRACT
  Filled 2019-07-14 (×6): qty 3
  Filled 2019-07-14: qty 6
  Filled 2019-07-14: qty 3

## 2019-07-14 MED ORDER — SODIUM CHLORIDE 0.9% FLUSH
3.0000 mL | Freq: Two times a day (BID) | INTRAVENOUS | Status: DC
  Administered 2019-07-14 – 2019-07-18 (×6): 3 mL via INTRAVENOUS

## 2019-07-14 MED ORDER — METHYLPREDNISOLONE SODIUM SUCC 125 MG IJ SOLR
125.0000 mg | Freq: Once | INTRAMUSCULAR | Status: AC
  Administered 2019-07-14: 125 mg via INTRAVENOUS
  Filled 2019-07-14: qty 2

## 2019-07-14 MED ORDER — ACETAMINOPHEN 325 MG PO TABS
650.0000 mg | ORAL_TABLET | Freq: Four times a day (QID) | ORAL | Status: DC | PRN
  Administered 2019-07-18: 650 mg via ORAL
  Filled 2019-07-14: qty 2

## 2019-07-14 MED ORDER — ACETAMINOPHEN 650 MG RE SUPP: 650.0000 mg | Freq: Four times a day (QID) | RECTAL | Status: DC | PRN

## 2019-07-14 MED ORDER — SODIUM CHLORIDE 0.9% FLUSH: 3.0000 mL | INTRAVENOUS | Status: DC | PRN

## 2019-07-14 MED ORDER — SODIUM CHLORIDE 0.9 % IV SOLN: 2.0000 g | Freq: Once | INTRAVENOUS | Status: DC

## 2019-07-14 MED ORDER — SODIUM CHLORIDE 0.9 % IV SOLN
2.0000 g | Freq: Three times a day (TID) | INTRAVENOUS | Status: DC
  Administered 2019-07-14 – 2019-07-18 (×12): 2 g via INTRAVENOUS
  Filled 2019-07-14 (×12): qty 2

## 2019-07-14 MED ORDER — OXYCODONE HCL 5 MG PO TABS
30.0000 mg | ORAL_TABLET | ORAL | Status: DC | PRN
  Administered 2019-07-14 – 2019-07-15 (×3): 30 mg via ORAL
  Filled 2019-07-14 (×4): qty 6

## 2019-07-14 MED ORDER — ENOXAPARIN SODIUM 40 MG/0.4ML ~~LOC~~ SOLN
40.0000 mg | SUBCUTANEOUS | Status: DC
  Administered 2019-07-14 – 2019-07-17 (×4): 40 mg via SUBCUTANEOUS
  Filled 2019-07-14 (×4): qty 0.4

## 2019-07-14 MED ORDER — SODIUM CHLORIDE 0.9 % IV SOLN: 2.0000 g | Freq: Three times a day (TID) | INTRAVENOUS | Status: DC

## 2019-07-14 MED ORDER — SODIUM CHLORIDE 0.9 % IV SOLN: INTRAVENOUS | Status: DC

## 2019-07-14 MED ORDER — VANCOMYCIN HCL 2000 MG/400ML IV SOLN
2000.0000 mg | Freq: Once | INTRAVENOUS | Status: AC
  Administered 2019-07-14: 2000 mg via INTRAVENOUS
  Filled 2019-07-14: qty 400

## 2019-07-14 MED ORDER — BUDESONIDE 0.25 MG/2ML IN SUSP
0.2500 mg | Freq: Two times a day (BID) | RESPIRATORY_TRACT | Status: DC
  Administered 2019-07-14 – 2019-07-16 (×4): 0.25 mg via RESPIRATORY_TRACT
  Filled 2019-07-14 (×4): qty 2

## 2019-07-14 MED ORDER — MORPHINE SULFATE ER 30 MG PO TBCR
30.0000 mg | EXTENDED_RELEASE_TABLET | Freq: Three times a day (TID) | ORAL | Status: DC
  Administered 2019-07-14 – 2019-07-15 (×2): 30 mg via ORAL
  Filled 2019-07-14 (×2): qty 1

## 2019-07-14 MED ORDER — POLYETHYLENE GLYCOL 3350 17 G PO PACK
17.0000 g | PACK | Freq: Every day | ORAL | Status: DC | PRN
  Administered 2019-07-14 – 2019-07-17 (×2): 17 g via ORAL
  Filled 2019-07-14: qty 1

## 2019-07-14 MED ORDER — VANCOMYCIN HCL IN DEXTROSE 1-5 GM/200ML-% IV SOLN: 1000.0000 mg | Freq: Once | INTRAVENOUS | Status: DC

## 2019-07-14 MED ORDER — ALBUTEROL SULFATE (2.5 MG/3ML) 0.083% IN NEBU: 2.5000 mg | INHALATION_SOLUTION | RESPIRATORY_TRACT | Status: DC | PRN

## 2019-07-14 MED ORDER — POLYETHYLENE GLYCOL 3350 17 GM/SCOOP PO POWD
17.0000 g | Freq: Every day | ORAL | Status: DC
  Filled 2019-07-14: qty 255

## 2019-07-14 MED ORDER — KETOROLAC TROMETHAMINE 15 MG/ML IJ SOLN
15.0000 mg | Freq: Four times a day (QID) | INTRAMUSCULAR | Status: DC | PRN
  Administered 2019-07-14 – 2019-07-18 (×13): 15 mg via INTRAVENOUS
  Filled 2019-07-14 (×13): qty 1

## 2019-07-14 MED ORDER — IPRATROPIUM BROMIDE HFA 17 MCG/ACT IN AERS: 2.0000 | INHALATION_SPRAY | Freq: Once | RESPIRATORY_TRACT | Status: DC

## 2019-07-14 MED ORDER — ALBUTEROL SULFATE HFA 108 (90 BASE) MCG/ACT IN AERS
4.0000 | INHALATION_SPRAY | Freq: Once | RESPIRATORY_TRACT | Status: AC
  Administered 2019-07-14: 4 via RESPIRATORY_TRACT
  Filled 2019-07-14: qty 6.7

## 2019-07-14 MED ORDER — ALBUTEROL SULFATE (2.5 MG/3ML) 0.083% IN NEBU
2.5000 mg | INHALATION_SOLUTION | RESPIRATORY_TRACT | Status: DC | PRN
  Administered 2019-07-16: 2.5 mg via RESPIRATORY_TRACT
  Filled 2019-07-14: qty 3

## 2019-07-14 MED ORDER — AEROCHAMBER Z-STAT PLUS/MEDIUM MISC
Status: AC
  Administered 2019-07-14: 1
  Filled 2019-07-14: qty 1

## 2019-07-14 MED ORDER — ONDANSETRON 4 MG PO TBDP: 4.0000 mg | ORAL_TABLET | Freq: Three times a day (TID) | ORAL | Status: DC | PRN

## 2019-07-14 MED ORDER — FUROSEMIDE 40 MG PO TABS
40.0000 mg | ORAL_TABLET | Freq: Every day | ORAL | Status: DC
  Administered 2019-07-15 – 2019-07-18 (×4): 40 mg via ORAL
  Filled 2019-07-14 (×4): qty 1

## 2019-07-14 MED ORDER — SODIUM CHLORIDE 0.9 % IV SOLN: 250.0000 mL | INTRAVENOUS | Status: DC | PRN

## 2019-07-14 MED ORDER — IPRATROPIUM-ALBUTEROL 0.5-2.5 (3) MG/3ML IN SOLN
3.0000 mL | Freq: Once | RESPIRATORY_TRACT | Status: AC
  Administered 2019-07-14: 3 mL via RESPIRATORY_TRACT

## 2019-07-14 MED ORDER — MONTELUKAST SODIUM 10 MG PO TABS
10.0000 mg | ORAL_TABLET | Freq: Every day | ORAL | Status: DC
  Administered 2019-07-14 – 2019-07-18 (×5): 10 mg via ORAL
  Filled 2019-07-14 (×5): qty 1

## 2019-07-14 MED ORDER — PREGABALIN 75 MG PO CAPS
150.0000 mg | ORAL_CAPSULE | Freq: Three times a day (TID) | ORAL | Status: DC
  Administered 2019-07-14 – 2019-07-18 (×11): 150 mg via ORAL
  Filled 2019-07-14 (×11): qty 2

## 2019-07-14 MED ORDER — ONDANSETRON HCL 4 MG/2ML IJ SOLN: 4.0000 mg | Freq: Four times a day (QID) | INTRAMUSCULAR | Status: DC | PRN

## 2019-07-14 MED ORDER — METHYLPREDNISOLONE SODIUM SUCC 125 MG IJ SOLR
60.0000 mg | Freq: Four times a day (QID) | INTRAMUSCULAR | Status: DC
  Administered 2019-07-14 – 2019-07-18 (×15): 60 mg via INTRAVENOUS
  Filled 2019-07-14 (×15): qty 2

## 2019-07-14 MED ORDER — SODIUM CHLORIDE 0.9 % IV SOLN
2.0000 g | Freq: Three times a day (TID) | INTRAVENOUS | Status: DC
  Filled 2019-07-14: qty 2

## 2019-07-14 MED ORDER — HYDROCODONE-HOMATROPINE 5-1.5 MG/5ML PO SYRP
5.0000 mL | ORAL_SOLUTION | Freq: Four times a day (QID) | ORAL | Status: DC | PRN
  Administered 2019-07-15 – 2019-07-18 (×9): 5 mL via ORAL
  Filled 2019-07-14 (×11): qty 5

## 2019-07-14 MED ORDER — ALBUTEROL SULFATE HFA 108 (90 BASE) MCG/ACT IN AERS: 4.0000 | INHALATION_SPRAY | Freq: Once | RESPIRATORY_TRACT | Status: DC

## 2019-07-14 MED ORDER — ONDANSETRON HCL 4 MG PO TABS: 4.0000 mg | ORAL_TABLET | Freq: Four times a day (QID) | ORAL | Status: DC | PRN

## 2019-07-14 NOTE — Progress Notes (Signed)
[facility]  Chief Complaint  Patient presents with  . Hospitalization Follow-up    Patient wears 5 liters O2 all the time. Patient has shortness of breath with exertion like walking,showering,getting dressed. Patient started feeling bad again on Sunday. He is having trouble breathing.  He is retaining fluid takes Lasix and has not missed any doses. Patient has productive cough with clear to yellow sputum.    Constitutional:  BP 140/70 (BP Location: Left Arm, Patient Position: Sitting, Cuff Size: Normal)   Pulse (!) 107   Temp (!) 97.1 F (36.2 C) (Temporal)   Ht _0  (1.854 m)   Wt 189 lb (85.7 kg)   SpO2 99%   BMI 24.94 kg/m   Past Medical History:  Osteoporosis, Neuropathy, COPD, Back pain, s/p Rt VATS bullectomy 01/16/13, s/p Lt VATS bullectomy 08/28/13, Asbestos exposure, Lt lung hernia, Chronic pain  Summary:  Wesley [name] is a 54 y.o. male former smoker with COPD and bullous emphysema.  S/p bullectomy in 2014 and 2015 at [facility]  He reports occupational asbestos exposure.  Subjective:   He was back in hospital in April with COPD exacerbation.  Sputum culture grew Pseudomonas that was pan sensitive.  CXR from 07/01/19 (reviewed by me) didn't show infiltrate.  He was discharged on prednisone and levaquin.  He took his last dose of antibiotic and prednisone yesterday. He says he was sent home with doxycycline and not levaquin.  His breathing is getting worse again.  He is coughing with clear to yellow sputum with some blood streaking.  He is wheezing more.  He has noticed more swelling in his legs.  He has been taking lasix 40 mg bid since being in the hospital.  He says he has "been down this road plenty of times before" and feels that oral medications at home will not be sufficient.  He feels he is at a point that he needs to go back to the hospital.   Physical Exam:   Appearance - wearing oxyen  ENMT - no sinus tenderness, no  nasal discharge, no oral exudate, Mallampati 3  Respiratory - diffuse b/l expiratory wheezing, scattered rhonchi that partially clears with coughing  CV - regular rate, tachycardic, no murmur  GI - soft, non tender  Lymph - no adenopathy noted in neck  Ext - 1+ edema  Skin - no rashes  Neuro - normal strength, oriented x 3  Psych - normal mood and affect  Assessment/Plan:   Recurrent COPD exacerbation in setting of severe COPD with emphysema. - will have him go to ER at [facility] to arrange for admission - will need another round of solumedrol - would place him on IV abx that cover for Pseudomonas - f/u CXR, BMET, CBC with diff - would use pulmicort, duoneb in hospital  Chronic hypoxic respiratory failure. - goal SpO2 88 to 95%  Leg edema. - continue lasix 40 mg bid for now - f/u BMET  Severe bullous emphysema s/p b/l bullectomy. Lt lung herniation. - previously followed by Dr. Erie Noe with thoracic surgery at [facility]  Follow up:  Patient Instructions  Will need to arrange for admission to [facility]   Signature:  Chesley Mires, MD [place] Pager: [phone] 07/14/2019, 10:50 AM  Flow Sheet     Pulmonary tests:   A1AT 01/14/19 >> 165  PFT 11/20/12 >> FEV1 0.86 (21%), FEV1% 30, TLC 10.41 (145%), DLCO 42%  Sleep tests:   CT chest 06/15/19 >>  severe emphysema with bullous changes at apices, prior wedge resection from Rt and Lt upper lobes, lung hernia on Lt  Cardiac tests:   Echo 06/16/19 >> EF 65 to 70%  Doppler legs 06/16/19 >> no DVT  Medications:   Allergies as of 07/14/2019      Reactions   Ambien [zolpidem Tartrate] Other (See Comments)   hallucinations   Melatonin Itching   Zolpidem Other (See Comments)   Altered mental status      Medication List       Accurate as of Jul 14, 2019 10:50 AM. If you have any questions, ask your nurse or doctor.        acetaminophen 325 MG tablet Commonly known as:  TYLENOL Take 2 tablets by mouth every 4 (four) hours as needed.   Advair Diskus 250-50 MCG/DOSE Aepb Generic drug: Fluticasone-Salmeterol Inhale 1 puff into the lungs 2 (two) times daily.   albuterol (2.5 MG/3ML) 0.083% nebulizer solution Commonly known as: PROVENTIL Take 3 mLs (2.5 mg total) by nebulization every 6 (six) hours as needed for wheezing or shortness of breath. What changed: when to take this   albuterol 108 (90 Base) MCG/ACT inhaler Commonly known as: VENTOLIN HFA Inhale 2 puffs into the lungs every 4 (four) hours as needed for wheezing or shortness of breath. Shortness of breath What changed: Another medication with the same name was changed. Make sure you understand how and when to take each.   aspirin 81 MG chewable tablet Chew 1 tablet (81 mg total) by mouth daily.   blood glucose meter kit and supplies Kit Dispense based on patient and insurance preference. Use up to four times daily as directed. (FOR ICD-9 250.00, 250.01).   furosemide 40 MG tablet Commonly known as: LASIX Take 1 tablet (40 mg total) by mouth daily.   guaiFENesin-dextromethorphan 100-10 MG/5ML syrup Commonly known as: ROBITUSSIN DM Take 5 mLs by mouth every 4 (four) hours as needed for cough.   Incruse Ellipta 62.5 MCG/INH Aepb Generic drug: umeclidinium bromide Inhale 1 puff into the lungs daily.   levofloxacin 500 MG tablet Commonly known as: LEVAQUIN Take 1 tablet (500 mg total) by mouth daily.   loperamide 2 MG capsule Commonly known as: IMODIUM Take 1 capsule by mouth 4 (four) times daily.   montelukast 10 MG tablet Commonly known as: SINGULAIR Take 1 tablet by mouth daily.   morphine 30 MG 12 hr tablet Commonly known as: MS CONTIN Take 30 mg by mouth every 8 (eight) hours.   mupirocin ointment 2 % Commonly known as: BACTROBAN Place 1 application into the nose daily.   Narcan 4 MG/0.1ML Liqd nasal spray kit Generic drug: naloxone Place 4 mg into the nose as directed.  To prevent overdose   ondansetron 4 MG disintegrating tablet Commonly known as: ZOFRAN-ODT Take 1 tablet by mouth every 8 (eight) hours as needed.   oxycodone 30 MG immediate release tablet Commonly known as: ROXICODONE Take 30 mg by mouth every 4 (four) hours as needed for pain.   predniSONE 10 MG tablet Commonly known as: DELTASONE Take 71m po daily for 2 days then 439mpo daily for 2 days then 302maily for 2 days then 24m49mily for 2 days then 10mg75mly for 2 days then stop   pregabalin 150 MG capsule Commonly known as: LYRICA Take 1 capsule by mouth 3 (three) times daily.   Systane 0.4-0.3 % Soln Generic drug: Polyethyl Glycol-Propyl Glycol Place 1-2 drops into the left eye daily  as needed (for dry eye relief).       Past Surgical History:  He  has a past surgical history that includes Joint replacement; Back surgery; Lung surgery; Eye surgery; Total hip arthroplasty (Right, 09/24/2015); and LEFT HEART CATH AND CORONARY ANGIOGRAPHY (N/A, 07/20/2016).  Family History:  His family history includes Cancer in his mother; Diabetes in his father and mother; Heart failure in his father.  Social History:  He  reports that he quit smoking about 8 years ago. His smoking use included e-cigarettes. He smoked 1.00 pack per day. He has never used smokeless tobacco. He reports that he does not drink alcohol or use drugs.

## 2019-07-14 NOTE — Patient Instructions (Signed)
Will need to arrange for admission to [facility]

## 2019-07-14 NOTE — ED Triage Notes (Signed)
Sent by Dr Gomez Cleverly from [facility].  Increasing SOB since Saturday evening.  Not able to walk more than 5 steps. Not able to lay flat in bed.  Lung surgery at [facility].  O2@ 5L/M at home.

## 2019-07-14 NOTE — Progress Notes (Signed)
Pharmacy Antibiotic Note  Wesley [name] is a 54 y.o. male admitted on 07/14/2019 with pneumonia.  Pharmacy has been consulted for Vancomycin and Cefepime dosing.  Plan: Vancomycin 2000 mg IV x 1 dose. Vancomycin 1250 mg IV every 12 hours. Predicted AUC 500 Cefepime 2000 mg IV every 8 hours. Monitor labs, c/s, and vanco level as indicated.   Height: 6\' 1"  (185.4 cm) Weight: 86.2 kg (190 lb) IBW/kg (Calculated) : 79.9  Temp (24hrs), Avg:97.7 F (36.5 C), Min:97.1 F (36.2 C), Max:98.2 F (36.8 C)  Recent Labs  Lab 07/14/19 1350  WBC 16.1*  CREATININE 0.81    Estimated Creatinine Clearance: 115.1 mL/min (by C-G formula based on SCr of 0.81 mg/dL).    Allergies  Allergen Reactions  . Ambien [Zolpidem Tartrate] Other (See Comments)    hallucinations  . Melatonin Itching  . Zolpidem Other (See Comments)    Altered mental status    Antimicrobials this admission: Vanco 5/10 >>  Cefepime 5/10 >>   Dose adjustments this admission: N/A  Microbiology results: 5/10 MRSA PCR: pending  Thank you for allowing pharmacy to be a part of this patient's care.  Ramond Craver 07/14/2019 3:21 PM

## 2019-07-14 NOTE — ED Provider Notes (Signed)
[facility] EMERGENCY DEPARTMENT Provider Note   CSN: [id number] Arrival date & time: 07/14/19  1300     History Chief Complaint  Patient presents with  . Shortness of Breath    Wesley [name] is a 54 y.o. male.  Patient with hx copd, presents from [facility] Pulmonary office for admission with increased sob, copd exacerbation. Patient indicates symptoms progressively worse in past week, mod-severe sob, worse w exertion, worse w lying flat. +increased bil leg swelling. Compliant w home meds. +increased prod cough. No sore throat. No known covid exposure. Did not have covid or the vaccine. No fever or chills. No chest pain or discomfort. No associated diaphoresis or nausea/vomiting.   The history is provided by the patient.  Shortness of Breath Associated symptoms: cough and wheezing   Associated symptoms: no abdominal pain, no chest pain, no fever, no headaches, no neck pain, no rash, no sore throat and no vomiting        Past Medical History:  Diagnosis Date  . Asbestos exposure   . Avascular necrosis of femur ([place])   . Back pain   . Bullous emphysema ([place])   . COPD (chronic obstructive pulmonary disease) ([place])   . Neuropathy   . On home oxygen therapy    "3L prn" (07/19/2016)  . Osteoporosis   . 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 Acute exacerbation of chronic obstructive pulmonary disease (COPD) ([place]) 07/01/2019  . Acute on chronic respiratory failure ([place]) 06/13/2019  . Acute on chronic respiratory failure with hypoxia ([place]) 06/13/2019  . Cellulitis of abdominal wall   . Leg pain   . SOB (shortness of breath) 01/09/2019  . Chronic respiratory failure with hypoxia ([place]) 01/09/2019  . CAP (community acquired pneumonia) 01/08/2019  . Normal coronary arteries 08/02/2016  . Atypical chest pain 07/20/2016  . Acute coronary syndrome ([place]) 07/19/2016  . Abnormal stress  test 07/19/2016  . Unstable angina ([place]) 07/19/2016  . Sepsis ([place]) 03/30/2016  . HCAP (healthcare-associated pneumonia) 03/30/2016  . Hypokalemia 01/08/2016  . History of DVT (deep vein thrombosis) 01/08/2016  . OA (osteoarthritis) of hip 09/24/2015  . Community acquired pneumonia 04/14/2015  . Vomiting and diarrhea   . Solitary pulmonary nodule 05/18/2014  . Nausea and vomiting 05/16/2014  . Bronchitis, acute, with bronchospasm 02/24/2014  . Osteoporosis, unspecified 08/06/2012  . Hip pain 08/06/2012  . Opiate abuse, continuous ([place]) 01/05/2012  . Opiate dependence ([place]) 01/03/2012  . Chronic respiratory failure ([place]) 01/03/2012  . COPD exacerbation ([place]) 01/03/2012  . Pulmonary infection 01/03/2012  . Pneumonia 07/19/2011  . Fever 07/18/2011  . Myalgia 07/18/2011  . Leukocytosis 07/18/2011  . COPD (chronic obstructive pulmonary disease) ([place]) 07/18/2011  . Acute-on-chronic respiratory failure ([place]) 07/18/2011  . Tachycardia 07/18/2011  . Dehydration 07/18/2011  . Chronic pain 07/18/2011  . Tobacco abuse 07/18/2011    Past Surgical History:  Procedure Laterality Date  . BACK SURGERY    . EYE SURGERY     left eye cornea and lens replaced- blind in left eye  . JOINT REPLACEMENT    . LEFT HEART CATH AND CORONARY ANGIOGRAPHY N/A 07/20/2016   Procedure: Left Heart Cath and Coronary Angiography;  Surgeon: Lorretta Harp, MD;  Location: [place];  Service: Cardiovascular;  Laterality: N/A;  . LUNG SURGERY    . TOTAL HIP ARTHROPLASTY Right 09/24/2015  Procedure: TOTAL HIP ARTHROPLASTY ANTERIOR APPROACH;  Surgeon: Gaynelle Arabian, MD;  Location: [facility];  Service: Orthopedics;  Laterality: Right;       Family History  Problem Relation Age of Onset  . Heart failure Father   . Diabetes Father   . Diabetes Mother   . Cancer Mother        male cancer    Social History   Tobacco Use  . Smoking status: Former Smoker    Packs/day: 1.00    Types: E-cigarettes      Quit date: 07/05/2011    Years since quitting: 8.0  . Smokeless tobacco: Never Used  . Tobacco comment: using patches/ e-cigarette per pt  Substance Use Topics  . Alcohol use: No  . Drug use: No    Types: Oxycodone, Morphine    Comment: takes these medications as prescribed by Physician for pain!    Home Medications Prior to Admission medications   Medication Sig Start Date End Date Taking? Authorizing Provider  acetaminophen (TYLENOL) 325 MG tablet Take 2 tablets by mouth every 4 (four) hours as needed.     [provider]  ADVAIR DISKUS 250-50 MCG/DOSE AEPB Inhale 1 puff into the lungs 2 (two) times daily. 06/14/15   [provider]  albuterol (PROVENTIL HFA;VENTOLIN HFA) 108 (90 Base) MCG/ACT inhaler Inhale 2 puffs into the lungs every 4 (four) hours as needed for wheezing or shortness of breath. Shortness of breath 01/11/16   Isaac Bliss, Rayford Halsted, MD  albuterol (PROVENTIL) (2.5 MG/3ML) 0.083% nebulizer solution Take 3 mLs (2.5 mg total) by nebulization every 6 (six) hours as needed for wheezing or shortness of breath. Patient taking differently: Take 2.5 mg by nebulization in the morning, at noon, and at bedtime.  02/27/14   Kathie Dike, MD  aspirin 81 MG chewable tablet Chew 1 tablet (81 mg total) by mouth daily. 07/20/16   Cheryln Manly, NP  blood glucose meter kit and supplies KIT Dispense based on patient and insurance preference. Use up to four times daily as directed. (FOR ICD-9 250.00, 250.01). 07/04/19   Kathie Dike, MD  furosemide (LASIX) 40 MG tablet Take 1 tablet (40 mg total) by mouth daily. 07/05/19   Kathie Dike, MD  guaiFENesin-dextromethorphan (ROBITUSSIN DM) 100-10 MG/5ML syrup Take 5 mLs by mouth every 4 (four) hours as needed for cough. 07/04/19   Kathie Dike, MD  INCRUSE ELLIPTA 62.5 MCG/INH AEPB Inhale 1 puff into the lungs daily. 12/06/18   [provider]  levofloxacin (LEVAQUIN) 500 MG tablet Take 1 tablet (500 mg  total) by mouth daily. 07/05/19   Kathie Dike, MD  loperamide (IMODIUM) 2 MG capsule Take 1 capsule by mouth 4 (four) times daily.    [provider]  montelukast (SINGULAIR) 10 MG tablet Take 1 tablet by mouth daily. 07/07/16   [provider]  morphine (MS CONTIN) 30 MG 12 hr tablet Take 30 mg by mouth every 8 (eight) hours. 12/30/18   [provider]  mupirocin ointment (BACTROBAN) 2 % Place 1 application into the nose daily. 12/03/18   [provider]  naloxone HCl (NARCAN) 4 MG/0.1ML LIQD Place 4 mg into the nose as directed. To prevent overdose    [provider]  ondansetron (ZOFRAN-ODT) 4 MG disintegrating tablet Take 1 tablet by mouth every 8 (eight) hours as needed. 09/05/18   [provider]  oxycodone (ROXICODONE) 30 MG immediate release tablet Take 30 mg by mouth every 4 (four) hours as needed  for pain. 06/12/19   [provider]  Polyethyl Glycol-Propyl Glycol (SYSTANE) 0.4-0.3 % SOLN Place 1-2 drops into the left eye daily as needed (for dry eye relief).     [provider]  predniSONE (DELTASONE) 10 MG tablet Take 62m po daily for 2 days then 466mpo daily for 2 days then 3065maily for 2 days then 36m40mily for 2 days then 10mg58mly for 2 days then stop 07/04/19   MemonKathie Dike pregabalin (LYRICA) 150 MG capsule Take 1 capsule by mouth 3 (three) times daily. 12/30/18   [provider]    Allergies    Ambien [zolpidem tartrate], Melatonin, and Zolpidem  Review of Systems   Review of Systems  Constitutional: Negative for chills and fever.  HENT: Negative for sore throat.   Eyes: Negative for redness.  Respiratory: Positive for cough, shortness of breath and wheezing.   Cardiovascular: Positive for leg swelling. Negative for chest pain and palpitations.  Gastrointestinal: Negative for abdominal pain and vomiting.  Genitourinary: Negative for flank pain.  Musculoskeletal: Negative for back pain  and neck pain.  Skin: Negative for rash.  Neurological: Negative for headaches.  Hematological: Does not bruise/bleed easily.  Psychiatric/Behavioral: Negative for confusion.    Physical Exam Updated Vital Signs Pulse (!) 109   Temp 98.2 F (36.8 C) (Oral)   Resp 18   Ht 1.854 m (6' 1" )   Wt 86.2 kg   SpO2 100%   BMI 25.07 kg/m   Physical Exam Vitals and nursing note reviewed.  Constitutional:      General: He is in acute distress.     Appearance: He is well-developed.  HENT:     Head: Atraumatic.     Nose: Nose normal.     Mouth/Throat:     Mouth: Mucous membranes are moist.     Pharynx: Oropharynx is clear.  Eyes:     General: No scleral icterus.    Conjunctiva/sclera: Conjunctivae normal.  Neck:     Trachea: No tracheal deviation.  Cardiovascular:     Rate and Wesley: Regular Wesley. Tachycardia present.     Pulses: Normal pulses.     Heart sounds: Normal heart sounds. No murmur. No friction rub. No gallop.   Pulmonary:     Effort: Respiratory distress present. No accessory muscle usage.     Breath sounds: Wheezing and rhonchi present.  Abdominal:     General: Bowel sounds are normal. There is no distension.     Palpations: Abdomen is soft.     Tenderness: There is no abdominal tenderness. There is no guarding.  Genitourinary:    Comments: No cva tenderness. Musculoskeletal:     Cervical back: Normal range of motion and neck supple. No rigidity.     Comments: Moderate bilateral leg swelling, symmetric.   Skin:    General: Skin is warm and dry.     Findings: No rash.  Neurological:     Mental Status: He is alert.     Comments: Alert, speech clear.   Psychiatric:        Mood and Affect: Mood normal.     ED Results / Procedures / Treatments   Labs (all labs ordered are listed, but only abnormal results are displayed) Results for orders placed or performed during the hospital encounter of 07/14/19  CBC  Result Value Ref Range   WBC 16.1 (H) 4.0 -  10.5 K/uL   RBC 3.81 (L) 4.22 - 5.81 MIL/uL  Hemoglobin 11.6 (L) 13.0 - 17.0 g/dL   HCT 38.8 (L) 39.0 - 52.0 %   MCV 101.8 (H) 80.0 - 100.0 fL   MCH 30.4 26.0 - 34.0 pg   MCHC 29.9 (L) 30.0 - 36.0 g/dL   RDW 13.6 11.5 - 15.5 %   Platelets 382 150 - 400 K/uL   nRBC 0.0 0.0 - 0.2 %  Comprehensive metabolic panel  Result Value Ref Range   Sodium 140 135 - 145 mmol/L   Potassium 3.7 3.5 - 5.1 mmol/L   Chloride 91 (L) 98 - 111 mmol/L   CO2 39 (H) 22 - 32 mmol/L   Glucose, Bld 117 (H) 70 - 99 mg/dL   BUN 18 6 - 20 mg/dL   Creatinine, Ser 0.81 0.61 - 1.24 mg/dL   Calcium 9.1 8.9 - 10.3 mg/dL   Total Protein 6.4 (L) 6.5 - 8.1 g/dL   Albumin 3.5 3.5 - 5.0 g/dL   AST 16 15 - 41 U/L   ALT 29 0 - 44 U/L   Alkaline Phosphatase 63 38 - 126 U/L   Total Bilirubin 0.4 0.3 - 1.2 mg/dL   GFR calc non Af Amer >60 >60 mL/min   GFR calc Af Amer >60 >60 mL/min   Anion gap 10 5 - 15  Brain natriuretic peptide  Result Value Ref Range   B Natriuretic Peptide 38.0 0.0 - 100.0 pg/mL   CT HEAD WO CONTRAST  Result Date: 06/14/2019 CLINICAL DATA:  54 year old male with bilateral upper extremity weakness, confusion, recent tick bite. EXAM: CT HEAD WITHOUT CONTRAST TECHNIQUE: Contiguous axial images were obtained from the base of the skull through the vertex without intravenous contrast. COMPARISON:  Head CT 04/02/2016. Brain MRI 04/03/2016. FINDINGS: Brain: Cerebral volume remains normal. No midline shift, ventriculomegaly, mass effect, evidence of mass lesion, intracranial hemorrhage or evidence of cortically based acute infarction. Gray-white matter differentiation is within normal limits throughout the brain. Vascular: No suspicious intracranial vascular hyperdensity. Skull: Negative. Sinuses/Orbits: Visualized paranasal sinuses and mastoids are clear. Other: Stable and negative orbit and scalp soft tissues. IMPRESSION: Stable and normal noncontrast CT appearance of the brain. Electronically Signed   By: Genevie Ann M.D.   On: 06/14/2019 17:28   CT CHEST WO CONTRAST  Result Date: 06/15/2019 CLINICAL DATA:  Shortness of breath x2 weeks, COPD exacerbation, history of left lung tumor status post surgery in 2017, pulmonary asbestosis EXAM: CT CHEST WITHOUT CONTRAST TECHNIQUE: Multidetector CT imaging of the chest was performed following the standard protocol without IV contrast. COMPARISON:  CTA chest dated 01/08/2019 FINDINGS: Cardiovascular: The heart is normal in size. No pericardial effusion. No evidence of thoracic aortic aneurysm. Mild atherosclerotic calcifications of the aortic arch. Mediastinum/Nodes: No suspicious mediastinal lymphadenopathy. Visualized thyroid is unremarkable. Lungs/Pleura: Severe emphysema with bullous changes at the lung apices. Suspected left upper lobe wedge resection with scarring in the lingula (series 4/image 93). Additional right upper lobe wedge resection (series 4/image 103). No suspicious pulmonary nodules. No focal consolidation. No pleural effusion or pneumothorax. Upper Abdomen: Visualized upper abdomen is grossly unremarkable. Musculoskeletal: Visualized osseous structures are within normal limits. IMPRESSION: No evidence of acute cardiopulmonary disease. Postsurgical changes in the bilateral upper lobes. Aortic Atherosclerosis (ICD10-I70.0) and Emphysema (ICD10-J43.9). Electronically Signed   By: Julian Hy M.D.   On: 06/15/2019 15:49   US Venous Img Lower Bilateral (DVT)  Result Date: 07/06/2019 CLINICAL DATA:  Bilateral lower extremity edema. EXAM: BILATERAL LOWER EXTREMITY VENOUS DOPPLER ULTRASOUND TECHNIQUE: Gray-scale sonography with graded  compression, as well as color Doppler and duplex ultrasound were performed to evaluate the lower extremity deep venous systems from the level of the common femoral vein and including the common femoral, femoral, profunda femoral, popliteal and calf veins including the posterior tibial, peroneal and gastrocnemius veins when  visible. The superficial great saphenous vein was also interrogated. Spectral Doppler was utilized to evaluate flow at rest and with distal augmentation maneuvers in the common femoral, femoral and popliteal veins. COMPARISON:  None. FINDINGS: RIGHT LOWER EXTREMITY Common Femoral Vein: No evidence of thrombus. Normal compressibility, respiratory phasicity and response to augmentation. Saphenofemoral Junction: No evidence of thrombus. Normal compressibility and flow on color Doppler imaging. Profunda Femoral Vein: No evidence of thrombus. Normal compressibility and flow on color Doppler imaging. Femoral Vein: No evidence of thrombus. Normal compressibility, respiratory phasicity and response to augmentation. Popliteal Vein: No evidence of thrombus. Normal compressibility, respiratory phasicity and response to augmentation. Calf Veins: No evidence of thrombus. Normal compressibility and flow on color Doppler imaging. Superficial Great Saphenous Vein: No evidence of thrombus. Normal compressibility. Venous Reflux:  None. Other Findings:  None. LEFT LOWER EXTREMITY Common Femoral Vein: No evidence of thrombus. Normal compressibility, respiratory phasicity and response to augmentation. Saphenofemoral Junction: No evidence of thrombus. Normal compressibility and flow on color Doppler imaging. Profunda Femoral Vein: No evidence of thrombus. Normal compressibility and flow on color Doppler imaging. Femoral Vein: No evidence of thrombus. Normal compressibility, respiratory phasicity and response to augmentation. Popliteal Vein: No evidence of thrombus. Normal compressibility, respiratory phasicity and response to augmentation. Calf Veins: No evidence of thrombus. Normal compressibility and flow on color Doppler imaging. Superficial Great Saphenous Vein: No evidence of thrombus. Normal compressibility. Venous Reflux:  None. Other Findings: No evidence of superficial thrombophlebitis or abnormal fluid collection. IMPRESSION:  No evidence of deep venous thrombosis in either lower extremity. Electronically Signed   By: Aletta Edouard M.D.   On: 07/06/2019 10:15   US Venous Img Lower Bilateral (DVT)  Result Date: 06/16/2019 CLINICAL DATA:  Acute bilateral lower extremity pain. EXAM: Bilateral LOWER EXTREMITY VENOUS DOPPLER ULTRASOUND TECHNIQUE: Gray-scale sonography with compression, as well as color and duplex ultrasound, were performed to evaluate the deep venous system(s) from the level of the common femoral vein through the popliteal and proximal calf veins. COMPARISON:  None. FINDINGS: VENOUS Normal compressibility of the common femoral, superficial femoral, and popliteal veins, as well as the visualized calf veins. Visualized portions of profunda femoral vein and great saphenous vein unremarkable. No filling defects to suggest DVT on grayscale or color Doppler imaging. Doppler waveforms show normal direction of venous flow, normal respiratory phasicity and response to augmentation. Limited views of the contralateral common femoral vein are unremarkable. OTHER None. Limitations: none IMPRESSION: No femoropopliteal DVT nor evidence of DVT within the visualized calf veins. If clinical symptoms are inconsistent or if there are persistent or worsening symptoms, further imaging (possibly involving the iliac veins) may be warranted. Electronically Signed   By: Marijo Conception M.D.   On: 06/16/2019 10:09   DG Chest Port 1 View  Result Date: 07/14/2019 CLINICAL DATA:  Shortness of breath EXAM: PORTABLE CHEST 1 VIEW COMPARISON:  July 01, 2019. chest radiograph and chest CT June 15, 2019 FINDINGS: There is bullous disease in the upper lobes, more severe on the right with postoperative changes and scarring in lung apices. There is an area of focal scarring in the lateral left mid lung. There is no edema or airspace opacity. The heart size is  normal. The pulmonary vascularity reflects a underlying emphysematous change and is stable. No  adenopathy. No bone lesions IMPRESSION: Extensive underlying emphysematous change with areas of bullous disease and scarring. No edema or airspace disease. Stable cardiac silhouette. No evident adenopathy. Emphysema (ICD10-J43.9). Electronically Signed   By: Lowella Grip III M.D.   On: 07/14/2019 14:04   DG Chest Portable 1 View  Result Date: 07/01/2019 CLINICAL DATA:  Shortness of breath. Hypoxia. EXAM: PORTABLE CHEST 1 VIEW COMPARISON:  CT chest 06/15/2019. One-view chest x-ray 06/13/2019. FINDINGS: Heart size is normal. Aortic atherosclerosis is again seen. Changes of severe COPD are evident. Scarring the left lung is stable. No acute superimposed disease is present. IMPRESSION: 1. Severe COPD. 2. No acute cardiopulmonary disease or significant interval change. Electronically Signed   By: San Morelle M.D.   On: 07/01/2019 11:44   EEG adult  Result Date: 06/16/2019 Lora Havens, MD     06/16/2019  4:56 PM Patient Name: DEAVON PODGORSKI MRN: [id number] Epilepsy Attending: Lora Havens Referring Physician/Provider: Dr. Shanon Brow Tat Date: 06/16/2019 Duration: 23.23 mins Patient history: 54 year old male admitted for shortness of breath.  Also noted to have intermittent bilateral hand weakness occasionally accompanied by confusion.  EEG to evaluate for seizures. Level of alertness: Awake AEDs during EEG study: Pregabalin Technical aspects: This EEG study was done with scalp electrodes positioned according to the 10-20 International system of electrode placement. Electrical activity was acquired at a sampling rate of 500Hz  and reviewed with a high frequency filter of 70Hz  and a low frequency filter of 1Hz . EEG data were recorded continuously and digitally stored. Description: No clear posterior dominant Wesley was seen. EEG showed continuous generalized polymorphic 3-6hz  theta-delta slowing. Hyperventilation and photic stimulation were not performed. ABNORMALITY - Continuous slow, generalized  IMPRESSION: This study is suggestive of moderate diffuse encephalopathy, non specific to etiology. No seizures or epileptiform discharges were seen throughout the recording. Lora Havens   ECHOCARDIOGRAM COMPLETE  Result Date: 06/16/2019    ECHOCARDIOGRAM REPORT   Patient Name:   STEFANO TRULSON Date of Exam: 06/16/2019 Medical Rec #:  [id number]        Height:       73.0 in Accession #:    [id number]       Weight:       181.0 lb Date of Birth:  October 31, 1965        BSA:          2.062 m Patient Age:    77 years         BP:           141/71 mmHg Patient Gender: M                HR:           94 bpm. Exam Location:  [facility] Procedure: 2D Echo Indications:    Dyspnea 786.09 / R06.00  History:        Patient has prior history of Echocardiogram examinations, most                 recent 07/24/2011. COPD, Signs/Symptoms:Chest Pain and Fever;                 Risk Factors:Current Smoker. Acute coronary syndrome , History                 of DVT , Pulmonary infection, Opiate abuse.  Sonographer:    Leavy Cella RDCS (AE) Referring Phys: [place]  TAT IMPRESSIONS  1. Left ventricular ejection fraction, by estimation, is 65 to 70%. The left ventricle has normal function. The left ventricle has no regional wall motion abnormalities. Left ventricular diastolic parameters are indeterminate.  2. Right ventricular systolic function is normal. The right ventricular size is normal. Tricuspid regurgitation signal is inadequate for assessing PA pressure.  3. The mitral valve is grossly normal. Trivial mitral valve regurgitation.  4. The aortic valve is tricuspid. Aortic valve regurgitation is not visualized.  5. The inferior vena cava is normal in size with greater than 50% respiratory variability, suggesting right atrial pressure of 3 mmHg. FINDINGS  Left Ventricle: Left ventricular ejection fraction, by estimation, is 65 to 70%. The left ventricle has normal function. The left ventricle has no regional wall motion  abnormalities. The left ventricular internal cavity size was normal in size. There is  no left ventricular hypertrophy. Left ventricular diastolic parameters are indeterminate. Right Ventricle: The right ventricular size is normal. No increase in right ventricular wall thickness. Right ventricular systolic function is normal. Tricuspid regurgitation signal is inadequate for assessing PA pressure. Left Atrium: Left atrial size was normal in size. Right Atrium: Right atrial size was normal in size. Pericardium: There is no evidence of pericardial effusion. Presence of pericardial fat pad. Mitral Valve: The mitral valve is grossly normal. Trivial mitral valve regurgitation. Tricuspid Valve: The tricuspid valve is grossly normal. Tricuspid valve regurgitation is trivial. Aortic Valve: The aortic valve is tricuspid. Aortic valve regurgitation is not visualized. Mild aortic valve annular calcification. Pulmonic Valve: The pulmonic valve was not well visualized. Pulmonic valve regurgitation is not visualized. Aorta: The aortic root is normal in size and structure. Venous: The inferior vena cava is normal in size with greater than 50% respiratory variability, suggesting right atrial pressure of 3 mmHg. IAS/Shunts: No atrial level shunt detected by color flow Doppler.  LEFT VENTRICLE PLAX 2D LVIDd:         4.66 cm  Diastology LVIDs:         2.85 cm  LV e' lateral:   9.64 cm/s LV PW:         1.01 cm  LV E/e' lateral: 10.1 LV IVS:        0.93 cm  LV e' medial:    13.20 cm/s LVOT diam:     2.10 cm  LV E/e' medial:  7.4 LV SV:         77 LV SV Index:   37 LVOT Area:     3.46 cm  RIGHT VENTRICLE RV S prime:     24.20 cm/s TAPSE (M-mode): 2.6 cm LEFT ATRIUM             Index LA diam:        3.30 cm 1.60 cm/m LA Vol (A2C):   30.3 ml 14.69 ml/m LA Vol (A4C):   47.3 ml 22.94 ml/m LA Biplane Vol: 38.0 ml 18.43 ml/m  AORTIC VALVE LVOT Vmax:   120.00 cm/s LVOT Vmean:  75.400 cm/s LVOT VTI:    0.222 m  AORTA Ao Root diam: 3.00 cm  MITRAL VALVE MV Area (PHT): 2.75 cm    SHUNTS MV Decel Time: 276 msec    Systemic VTI:  0.22 m MV E velocity: 97.30 cm/s  Systemic Diam: 2.10 cm MV A velocity: 49.60 cm/s MV E/A ratio:  1.96 Rozann Lesches MD Electronically signed by Rozann Lesches MD Signature Date/Time: 06/16/2019/11:49:28 AM    Final     EKG EKG Interpretation  Date/Time:  Monday Jul 14 2019 13:35:03 EDT Ventricular Rate:  112 PR Interval:    QRS Duration: 92 QT Interval:  316 QTC Calculation: 432 R Axis:   95 Text Interpretation: Sinus tachycardia Nonspecific ST abnormality Confirmed by [name] [phone]) on 07/14/2019 1:36:42 PM   Radiology DG Chest Port 1 View  Result Date: 07/14/2019 CLINICAL DATA:  Shortness of breath EXAM: PORTABLE CHEST 1 VIEW COMPARISON:  July 01, 2019. chest radiograph and chest CT June 15, 2019 FINDINGS: There is bullous disease in the upper lobes, more severe on the right with postoperative changes and scarring in lung apices. There is an area of focal scarring in the lateral left mid lung. There is no edema or airspace opacity. The heart size is normal. The pulmonary vascularity reflects a underlying emphysematous change and is stable. No adenopathy. No bone lesions IMPRESSION: Extensive underlying emphysematous change with areas of bullous disease and scarring. No edema or airspace disease. Stable cardiac silhouette. No evident adenopathy. Emphysema (ICD10-J43.9). Electronically Signed   By: Lowella Grip III M.D.   On: 07/14/2019 14:04    Procedures Procedures (including critical care time)  Medications Ordered in ED Medications  0.9 %  sodium chloride infusion (has no administration in time range)  methylPREDNISolone sodium succinate (SOLU-MEDROL) 125 mg/2 mL injection 125 mg (has no administration in time range)  albuterol (VENTOLIN HFA) 108 (90 Base) MCG/ACT inhaler 4 puff (has no administration in time range)  ipratropium (ATROVENT HFA) inhaler 2 puff (has no  administration in time range)    ED Course  I have reviewed the triage vital signs and the nursing notes.  Pertinent labs & imaging results that were available during my care of the patient were reviewed by me and considered in my medical decision making (see chart for details).    MDM Rules/Calculators/A&P                      Iv ns. Continuous pulse ox and monitor. Stat labs and imaging. Ecg.   Albuterol and atrovent breathing txs. Solumedrol iv.   MDM Number of Diagnoses or Management Options   Amount and/or Complexity of Data Reviewed Clinical lab tests: ordered and reviewed Tests in the radiology section of CPT: ordered and reviewed Tests in the medicine section of CPT: ordered and reviewed Discussion of test results with the performing providers: yes Decide to obtain previous medical records or to obtain history from someone other than the patient: yes Obtain history from someone other than the patient: yes Review and summarize past medical records: yes Discuss the patient with other providers: yes Independent visualization of images, tracings, or specimens: yes  Risk of Complications, Morbidity, and/or Mortality Presenting problems: high Diagnostic procedures: high Management options: high    Reviewed nursing notes and prior charts for additional history.  Recent admission for similar symptoms. Reviewed pulmonary note from today.   Additional albuterol and atrovent txs.   covid swab sent.   Initial labs reviewed/interpreted by me - k normal. hgb 11.6, similar to prior. Wbc 16, c/w priors.  CXR reviewed/interpreted by me - copd. No definite pna.   Additional labs reviewed/interpreted by me - bnp normal.   Additional albuterol/atrovent tx.   Pulmonary note from today rec iv abx, incl coverage for pseudomonas - will order initial dose.   Hospitalists consulted for admission.   CRITICAL CARE RE: acute on chronic respiratory failure, copd exacerbation, prod  cough.  Performed by: Mirna Mires Total critical care  time: 40 minutes Critical care time was exclusive of separately billable procedures and treating other patients. Critical care was necessary to treat or prevent imminent or life-threatening deterioration. Critical care was time spent personally by me on the following activities: development of treatment plan with patient and/or surrogate as well as nursing, discussions with consultants, evaluation of patient's response to treatment, examination of patient, obtaining history from patient or surrogate, ordering and performing treatments and interventions, ordering and review of laboratory studies, ordering and review of radiographic studies, pulse oximetry and re-evaluation of patient's condition.   Final Clinical Impression(s) / ED Diagnoses Final diagnoses:  None    Rx / DC Orders ED Discharge Orders    None       [name], MD 07/14/19 1512

## 2019-07-14 NOTE — H&P (Deleted)
[facility] History & Physical  Rob Doctor, hospital MD  CHRYSTOPHER BUNTIN 07/14/2019  Chief Complaint: Dr Ashok Cordia, ED HPI: The patient is a 54 y.o. year-old w/ hx of severe COPD, home O2, neuropathy, chronic back pain/ hx back surgery, L THA, asbestos exposure who presents today sent from lung MD office appt for admission due to acute bronchitis w/ COPD exacerbation.   Per Dr [name] note from today's visit, pt was in hospital in 4/21 w/ COPD exac w/ sputum cx that grew Pseudomonas pan sensitive and was dc'd on pred and levaquin. Took his last doses yesterday and may have been sent home on doxycycline not levaquin. Today breathing is worse again, coughing yellow sputum w/ some blood streaking, wheezing more and legs more swollen. Taking 40 bid lasix since hosp dc.  Pulm recommends IV abx w/ Pseudomonas coverage, repeat IV solumedrol and give pulmicort and duoneb in hospital, cont po lasix 40 bid for now.   Pt seen in ED, states that he has severe COPD but he will do well for 2-3 yrs then be in hospital for 2- 3 mos. He also states he has chronic pain . Sees [facility]. Also sees [facility] monthly and has gotten down from 80mg  tid > 60mg  tid  > 30 mg long-acting (MS Contin) tid now, and short-acting Oxy IR was taking 16 pills per day (30mg ) and is now down to 6 pills (30mg ) per day, this is per the patient.   Pt lives w/ his wife , no tobacco or etoh, smoke e-cigs (no juice types) and uses nicotine patch.   Denies any sig chest pain. Constipated and trying miralax at home but was told needs to drink w/ more liquid.  No abd pain or n/v/d.  No fevers.   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 Had bilat lung surgery (? Bleb resection) which improved his breathing.       ROS  denies CP  no joint pain   no HA  no blurry vision  no rash  no diarrhea  no nausea/ vomiting  no dysuria  no difficulty voiding  no change in urine  color    Past Medical History  Past Medical History:  Diagnosis Date  . Allergy   . Arthritis    neck  . Cataract    bilateral - MD monitoring cataracts  . CHF (congestive heart failure) ([place])   . Chronic kidney disease, stage I    DR OTTELIN  HX UTIS  . Cirrhosis ([place])   . Cramp of limb   . Diabetes mellitus   . Dysphagia, unspecified(787.20)   . Dysuria   . Epistaxis   . GERD (gastroesophageal reflux disease)   . Heart murmur    NO CARDIOLOGIST  DX FOR YEARS ASYMPTOMATIC  . Lumbago   . Neoplasm of uncertain behavior of skin   . Nonspecific elevation of levels of transaminase or lactic acid dehydrogenase (LDH)   . Osteoarthrosis, unspecified whether generalized or localized, unspecified site   . Other and unspecified hyperlipidemia    diet controlled  . Pain in joint, shoulder region   . Paresthesias 01/27/2015  . Postablative ovarian failure   . Trochanteric bursitis of left hip 10/12/2015  . Type 2 diabetes mellitus without complication ([place])   . Unspecified essential hypertension    no meds   Past Surgical History  Past Surgical History:  Procedure Laterality Date  . BREAST BIOPSY    .  CARDIAC CATHETERIZATION N/A 11/24/2015   Procedure: Left Heart Cath and Coronary Angiography;  Surgeon: Belva Crome, MD;  Location: [place] [facility];  Service: Cardiovascular;  Laterality: N/A;  . COLONOSCOPY  2012   Dr Lajoyce Corners.   . COLONOSCOPY WITH PROPOFOL N/A 05/04/2016   Procedure: COLONOSCOPY WITH PROPOFOL;  Surgeon: Milus Banister, MD;  Location: [facility];  Service: Endoscopy;  Laterality: N/A;  . CORONARY ARTERY BYPASS GRAFT N/A 11/25/2015   Procedure: CORONARY ARTERY BYPASS GRAFTING (CABG) x 3 USING RIGHT LEG GREATER SAPHENOUS VEIN GRAFT;  Surgeon: Melrose Nakayama, MD;  Location: [place];  Service: Open Heart Surgery;  Laterality: N/A;  . ENDOVEIN HARVEST OF GREATER SAPHENOUS VEIN Right 11/25/2015   Procedure: ENDOVEIN HARVEST OF GREATER SAPHENOUS VEIN;  Surgeon: Melrose Nakayama, MD;  Location: [place];  Service: Open Heart Surgery;  Laterality: Right;  . ESOPHAGEAL BANDING  01/23/2019   Procedure: ESOPHAGEAL BANDING;  Surgeon: Milus Banister, MD;  Location: [facility];  Service: Endoscopy;;  . ESOPHAGEAL BANDING  02/02/2019   Procedure: ESOPHAGEAL BANDING;  Surgeon: Juanita Craver, MD;  Location[facility] END[facility];  Service: Endoscopy;;  . ESOPHAGOGASTRODUODENOSCOPY N/A 02/02/2019   Procedure: ESOPHAGOGASTRODUODENOSCOPY (EGD);  Surgeon: Juanita Craver, MD;  Location: [facility] ENDOSCOPY;  Service: Endoscopy;  Laterality: N/A;  . ESOPHAGOGASTRODUODENOSCOPY (EGD) WITH PROPOFOL N/A 05/04/2016   Procedure: ESOPHAGOGASTRODUODENOSCOPY (EGD) WITH PROPOFOL;  Surgeon: Milus Banister, MD;  Location: [facility];  Service: Endoscopy;  Laterality: N/A;  . ESOPHAGOGASTRODUODENOSCOPY (EGD) WITH PROPOFOL N/A 01/23/2019   Procedure: ESOPHAGOGASTRODUODENOSCOPY (EGD) WITH PROPOFOL;  Surgeon: Milus Banister, MD;  Location: [facility];  Service: Endoscopy;  Laterality: N/A;  . HEMOSTASIS CLIP PLACEMENT  02/02/2019   Procedure: HEMOSTASIS CLIP PLACEMENT;  Surgeon: Juanita Craver, MD;  Location: [place] ENDOSCOPY;  Service: Endoscopy;;  . IR ANGIOGRAM SELECTIVE EACH ADDITIONAL VESSEL  02/03/2019  . IR EMBO ART  VEN HEMORR LYMPH EXTRAV  INC GUIDE ROADMAPPING  02/03/2019  . IR PARACENTESIS  02/03/2019  . IR TIPS  02/03/2019  . MAXIMUM ACCESS (MAS)POSTERIOR LUMBAR INTERBODY FUSION (PLIF) 1 LEVEL Left 04/07/2015   Procedure: FOR MAXIMUM ACCESS (MAS) POSTERIOR LUMBAR INTERBODY FUSION (PLIF) LUMBAR THREE-FOUR EXTRAFORAMINAL MICRODISCECTOMY LUMBAR FIVE-SACRAL ONE LEFT;  Surgeon: Eustace Moore, MD;  Location: [place];  Service: Neurosurgery;  Laterality: Left;  . RADIOLOGY WITH ANESTHESIA N/A 02/03/2019   Procedure: RADIOLOGY WITH ANESTHESIA;  Surgeon: Radiologist, Medication, MD;  Location: [place];  Service: Radiology;  Laterality: N/A;  . SCLEROTHERAPY  02/02/2019   Procedure: SCLEROTHERAPY;   Surgeon: Juanita Craver, MD;  Location: [facility] ENDOSCOPY;  Service: Endoscopy;;  . TEE WITHOUT CARDIOVERSION N/A 11/25/2015   Procedure: TRANSESOPHAGEAL ECHOCARDIOGRAM (TEE);  Surgeon: Melrose Nakayama, MD;  Location: [place];  Service: Open Heart Surgery;  Laterality: N/A;  . TUBAL LIGATION  1982   Dr Connye Burkitt  . UPPER GASTROINTESTINAL ENDOSCOPY    . VAGINAL HYSTERECTOMY  1997   Dr Rande Lawman   Family History  Family History  Problem Relation Age of Onset  . Lung cancer Father   . Arthritis Sister   . Arthritis Brother   . Heart disease Maternal Grandmother   . Heart disease Maternal Grandfather   . Heart disease Paternal Grandmother   . Heart disease Paternal Grandfather   . Breast cancer Mother   . Liver cancer Brother   . Breast cancer Maternal Aunt   . Breast cancer Paternal Aunt   . Colon cancer Neg Hx   . Esophageal cancer  Neg Hx   . Rectal cancer Neg Hx   . Stomach cancer Neg Hx    Social History  reports that she has never smoked. She has never used smokeless tobacco. She reports that she does not drink alcohol or use drugs. Allergies  Allergies  Allergen Reactions  . Kiwi Extract Anaphylaxis  . Tdap [Tetanus-Diphth-Acell Pertussis] Swelling and Other (See Comments)    Swelling at injection site, gets very hot  . Statins     RHABDOMYOLYSIS  . Latex Itching, Dermatitis and Rash  . Tramadol Nausea And Vomiting   Home medications Prior to Admission medications   Medication Sig Start Date End Date Taking? Authorizing Provider  acetaminophen (TYLENOL) 500 MG tablet Take 500 mg by mouth at bedtime.     [provider]  Aromatic Inhalants (VICKS VAPOR IN) Vicks Vapor Rub apply small amount to outside of nose to help breathing    [provider]  BD PEN NEEDLE NANO U/F 32G X 4 MM MISC USE THREE TIMES DAILY AS DIRECTED 09/23/18   Reed, Tiffany L, DO  Biotin 10000 MCG TABS Take 10,000 mcg by mouth every morning.    [provider]  bisacodyl (DULCOLAX) 10  MG suppository Place 1 suppository (10 mg total) rectally daily as needed for moderate constipation. 02/07/19   Aline August, MD  calcium carbonate (OS-CAL) 600 MG TABS Take 600 mg by mouth 2 (two) times daily with a meal.      [provider]  Cholecalciferol (VITAMIN D) 50 MCG (2000 UT) CAPS Take 2,000 Units by mouth daily.     [provider]  Cyanocobalamin (VITAMIN B 12 PO) Take 1,000 mcg by mouth daily.      [provider]  ezetimibe (ZETIA) 10 MG tablet TAKE 1 TABLET(10 MG) BY MOUTH DAILY 10/23/18   Reed, Tiffany L, DO  furosemide (LASIX) 40 MG tablet Take 40 mg by mouth daily.     [provider]  glucose blood test strip One Touch Ultra II strips. Use to test blood sugar three times daily. Dx: E11.65 03/29/17   Reed, Tiffany L, DO  insulin detemir (LEVEMIR) 100 UNIT/ML injection Inject 0.2 mLs (20 Units total) into the skin at bedtime. 02/08/19   Aline August, MD  Insulin Syringe-Needle U-100 (INSULIN SYRINGE 1CC/31GX5/16") 31G X 5/16" 1 ML MISC USE AS DIRECTED DAILY WITH LEVEMIR 05/24/18   Reed, Tiffany L, DO  JARDIANCE 25 MG TABS tablet Take 25 mg by mouth daily. 02/25/19   [provider]  lactulose (CHRONULAC) 10 GM/15ML solution Take 20 g by mouth 3 (three) times daily. 03/01/19   [provider]  loratadine (CLARITIN) 10 MG tablet Take 10 mg by mouth daily as needed for allergies.    [provider]  MAGNESIUM PO Take 500 mg by mouth 2 (two) times daily in the am and at bedtime..    [provider]  Multiple Vitamins-Minerals (MULTIVITAMIN WITH MINERALS) tablet Take 1 tablet by mouth daily.      [provider]  NOVOLOG FLEXPEN 100 UNIT/ML FlexPen Inject 12 units under the skin every morning, 8 units at lunch and 12 units at supper 12/17/18   Reed, Tiffany L, DO  ondansetron (ZOFRAN) 4 MG tablet Take 1 tablet (4 mg total) by mouth every 6 (six) hours as needed for nausea. 02/07/19   Aline August, MD   pantoprazole (PROTONIX) 40 MG tablet Take 1 tablet (40 mg total) by mouth 2 (two) times daily. 02/07/19  Aline August, MD  Polyethyl Glycol-Propyl Glycol (SYSTANE OP) Place 1 drop into both eyes 2 (two) times daily.    [provider]  Probiotic Product (PROBIOTIC DAILY PO) Take 1 capsule by mouth daily. Digestive Advantage Probiotic    [provider]  spironolactone (ALDACTONE) 50 MG tablet Take 1 tablet (50 mg total) by mouth 2 (two) times daily. 02/10/19   Gayland Curry, DO   Liver Function Tests Recent Labs  Lab 03/10/19 1436 03/12/19 1042  AST 58* 62*  ALT 41* 45*  ALKPHOS  --  127*  BILITOT 3.5* 3.4*  PROT 7.8 7.2  ALBUMIN  --  3.0*   Recent Labs  Lab 03/12/19 1042  LIPASE 29   CBC Recent Labs  Lab 03/10/19 1436 03/12/19 1042 03/12/19 1056  WBC 10.4 7.7  --   NEUTROABS 8,299* 5.9  --   HGB 16.1* 14.9 15.0  HCT 47.2* 43.4 44.0  MCV 92.5 94.6  --   PLT 151 116*  --    Basic Metabolic Panel Recent Labs  Lab 03/10/19 1436 03/12/19 1042 03/12/19 1056  NA 133* 131* 133*  K 4.5 4.6 4.6  CL 96* 97*  --   CO2 24 23  --   GLUCOSE 269* 138*  --   BUN 19 20  --   CREATININE 1.05* 1.13*  --   CALCIUM 11.1* 10.0  --    Iron/TIBC/Ferritin/ %Sat    Component Value Date/Time   IRON 29 03/04/2016 1422   TIBC 427 03/04/2016 1422   FERRITIN 13 03/04/2016 1422   IRONPCTSAT 7 (L) 03/04/2016 1422    Vitals:   03/12/19 1018 03/12/19 1030 03/12/19 1045 03/12/19 1215  BP: (!) 123/51 (!) 101/46  (!) 125/54  Pulse: 89 83    Resp: 16 15  20   Temp: 97.8 F (36.6 C)  97.9 F (36.6 C)   TempSrc: Oral  Rectal   SpO2: 99% 98%      Exam Gen alert, dusky, not in distress, on 5L  No rash, cyanosis or gangrene Sclera anicteric, throat clear  No jvd or bruits Chest decreased BS bilat diffusely, mild-mod exp wheezing bilat RRR no MRG Abd soft ntnd no mass or ascites +bs GU normal male MS no joint effusions or deformity Ext 1-2+ pretib / ankle  edema, no wounds or ulcers  Neuro is alert, Ox 3 , nf    Home meds:  - lasix 40 qd  - pregabalin 150 tid/ oxycodone 30mg  q 4hrs/ naloxone prn/ MS contin 30 tid/ hycodan 5-1.5 qid prn  - aspirin 81 qd  - advair diskus 250-50 bid/ home O2 5L/ levaquin 500 qd/ pred taper/ montelukast 10mg  qd/ incruse ellipta 62.5 mcg qd/ prn albuterol act+ nebs  - prn's/ vitamins/ supplements     Assessment/ Plan: 1. Acute bronchitis/ acute exacerbation of severe baseline COPD - per pulm recommendations will admit, start broad spec IV abx per pharm to cover pseudomonas, get sputum cx, IV steroids, nebs scheduled and prn, inhaled steroids, O2 therapy.  2. Vol excess/ LE edema - mild-mod pretib edema, cont home po lasix 40 qd here, ^ if needed. Last admit echo showed normal EF 3. Tobacco use - cont nicotine patch 4. Chronic pain syndrome - s/p fall from many yrs ago. F/B [facility] pain center, see HPI. Cont meds.       Kelly Splinter, MD   [facility] 03/12/2019, 1:10 PM

## 2019-07-15 DIAGNOSIS — J441 Chronic obstructive pulmonary disease with (acute) exacerbation: Secondary | ICD-10-CM

## 2019-07-15 LAB — CBC
HCT: 36.7 % — ABNORMAL LOW (ref 39.0–52.0)
Hemoglobin: 11 g/dL — ABNORMAL LOW (ref 13.0–17.0)
MCH: 30.9 pg (ref 26.0–34.0)
MCHC: 30 g/dL (ref 30.0–36.0)
MCV: 103.1 fL — ABNORMAL HIGH (ref 80.0–100.0)
Platelets: 326 10*3/uL (ref 150–400)
RBC: 3.56 MIL/uL — ABNORMAL LOW (ref 4.22–5.81)
RDW: 13.2 % (ref 11.5–15.5)
WBC: 17.9 10*3/uL — ABNORMAL HIGH (ref 4.0–10.5)
nRBC: 0 % (ref 0.0–0.2)

## 2019-07-15 LAB — BLOOD GAS, ARTERIAL
Acid-Base Excess: 9.2 mmol/L — ABNORMAL HIGH (ref 0.0–2.0)
Bicarbonate: 31.4 mmol/L — ABNORMAL HIGH (ref 20.0–28.0)
FIO2: 36
O2 Saturation: 95.7 %
Patient temperature: 37
pCO2 arterial: 68.8 mmHg (ref 32.0–48.0)
pH, Arterial: 7.329 — ABNORMAL LOW (ref 7.350–7.450)
pO2, Arterial: 82.2 mmHg — ABNORMAL LOW (ref 83.0–108.0)

## 2019-07-15 LAB — MRSA PCR SCREENING: MRSA by PCR: NEGATIVE

## 2019-07-15 LAB — GLUCOSE, CAPILLARY
Glucose-Capillary: 149 mg/dL — ABNORMAL HIGH (ref 70–99)
Glucose-Capillary: 138 mg/dL — ABNORMAL HIGH (ref 70–99)
Glucose-Capillary: 141 mg/dL — ABNORMAL HIGH (ref 70–99)
Glucose-Capillary: 191 mg/dL — ABNORMAL HIGH (ref 70–99)

## 2019-07-15 MED ORDER — MORPHINE SULFATE ER 30 MG PO TBCR
30.0000 mg | EXTENDED_RELEASE_TABLET | Freq: Two times a day (BID) | ORAL | Status: DC
  Administered 2019-07-15 – 2019-07-16 (×2): 30 mg via ORAL
  Filled 2019-07-15 (×2): qty 1

## 2019-07-15 MED ORDER — OXYCODONE HCL 5 MG PO TABS
20.0000 mg | ORAL_TABLET | Freq: Four times a day (QID) | ORAL | Status: DC | PRN
  Administered 2019-07-15 – 2019-07-16 (×4): 20 mg via ORAL
  Filled 2019-07-15 (×4): qty 4

## 2019-07-15 MED ORDER — POLYETHYLENE GLYCOL 3350 17 G PO PACK
17.0000 g | PACK | Freq: Every day | ORAL | Status: DC
  Administered 2019-07-15 – 2019-07-18 (×4): 17 g via ORAL
  Filled 2019-07-15 (×4): qty 1

## 2019-07-15 MED ORDER — GUAIFENESIN ER 600 MG PO TB12
600.0000 mg | ORAL_TABLET | Freq: Two times a day (BID) | ORAL | Status: DC
  Administered 2019-07-15 – 2019-07-18 (×7): 600 mg via ORAL
  Filled 2019-07-15 (×7): qty 1

## 2019-07-15 NOTE — Progress Notes (Signed)
Patient Demographics:    Wesley [name], is a 54 y.o. male, DOB - 1965-09-24, [id number]  Admit date - 07/14/2019   Admitting Physician Roney Jaffe, MD  Outpatient Primary MD for the patient is [name], MD  LOS - 1   Chief Complaint  Patient presents with   Shortness of Breath        Subjective:    Wesley [name] today has no fevers, no emesis,  No chest pain,    --Patient requesting more benzos and opiates despite falling asleep while talking -Patient is fixated on focus on the clock trying to get his opiates on time -Cough and shortness of breath persist wheezing persist  Assessment  & Plan :    Active Problems:   Opiate dependence (HCC)   Chronic respiratory failure (HCC)   SOB (shortness of breath)   Acute exacerbation of chronic obstructive pulmonary disease (COPD) (HCC)   COPD with exacerbation (HCC)  Brief Summary:- 54 yo male former smoker with recurrent COPD exacerbation with Pseudomonal tracheobronchitis and chronic opiate use with hypoxic and hypercapnic respiratory failure readmitted on 07/14/2019 with worsening hypoxia and worsening hypercapnia  A/p 1) acute on chronic hypoxic and hypercapnic respiratory failure--- ABG noted -Avoid excessive opiates as this depress respiration and lead to higher CO2 levels -Pulmonology consult appreciated discussed with Dr. Halford Chessman -Continue cefepime for Pseudomonas tracheobronchitis -Continue IV Solu-Medrol bronchodilators and mucolytics as ordered   2) chronic pain syndrome/chronic back pain----opiate regimen adjusted due to excessive sleepiness and hypercapnia -Patient is argumentative and unhappy about any adjustment to his opiate regimen, however patient falls asleep easily while talking   3) social/ethics--patient is a full code  4) neck lower extremity edema presumably from venous stasis--- TED stockings as  ordered  Disposition/Need for in-Hospital Stay- patient unable to be discharged at this time due to --Pseudomonas lung infection requiring IV cefepime as well as worsening hypoxia and hypercapnia requiring bronchodilators and supplemental oxygen  -Patient From: home D/C Place: home Barriers: Not Clinically Stable-   Code Status : full  Family Communication:   NA (patient is alert, awake and coherent)  Consults  :  pulm  DVT Prophylaxis  :  Lovenox - - SCDs   Lab Results  Component Value Date   PLT 326 07/15/2019    Inpatient Medications  Scheduled Meds:  albuterol  4 puff Inhalation Once   aspirin  81 mg Oral Daily   budesonide (PULMICORT) nebulizer solution  0.25 mg Nebulization BID   enoxaparin (LOVENOX) injection  40 mg Subcutaneous Q24H   furosemide  40 mg Oral Daily   guaiFENesin  600 mg Oral BID   ipratropium-albuterol  3 mL Nebulization Q6H   methylPREDNISolone (SOLU-MEDROL) injection  60 mg Intravenous Q6H   montelukast  10 mg Oral Daily   morphine  30 mg Oral Q12H   nicotine  14 mg Transdermal Daily   polyethylene glycol  17 g Oral Daily   pregabalin  150 mg Oral TID   sodium chloride flush  3 mL Intravenous Q12H   Continuous Infusions:  sodium chloride     ceFEPime (MAXIPIME) IV 2 g (07/15/19 1821)   vancomycin 1,250 mg (07/15/19 1800)   PRN Meds:.sodium chloride, acetaminophen **OR** acetaminophen, albuterol, guaiFENesin-dextromethorphan, HYDROcodone-homatropine, ketorolac,  ondansetron **OR** ondansetron (ZOFRAN) IV, ondansetron, oxyCODONE, polyethylene glycol, sodium chloride flush    Anti-infectives (From admission, onward)   Start     Dose/Rate Route Frequency Ordered Stop   07/15/19 0500  vancomycin (VANCOREADY) IVPB 1250 mg/250 mL     1,250 mg 166.7 mL/hr over 90 Minutes Intravenous Every 12 hours 07/14/19 1520     07/15/19 0000  ceFEPIme (MAXIPIME) 2 g in sodium chloride 0.9 % 100 mL IVPB  Status:  Discontinued     2 g 200 mL/hr  over 30 Minutes Intravenous Every 8 hours 07/14/19 1516 07/14/19 1524   07/14/19 1800  ceFEPIme (MAXIPIME) 2 g in sodium chloride 0.9 % 100 mL IVPB     2 g 200 mL/hr over 30 Minutes Intravenous Every 8 hours 07/14/19 1648     07/14/19 1600  ceFEPIme (MAXIPIME) 2 g in sodium chloride 0.9 % 100 mL IVPB  Status:  Discontinued     2 g 200 mL/hr over 30 Minutes Intravenous Every 8 hours 07/14/19 1524 07/14/19 1648   07/14/19 1530  vancomycin (VANCOREADY) IVPB 2000 mg/400 mL     2,000 mg 200 mL/hr over 120 Minutes Intravenous  Once 07/14/19 1515 07/14/19 1728   07/14/19 1515  vancomycin (VANCOCIN) IVPB 1000 mg/200 mL premix  Status:  Discontinued     1,000 mg 200 mL/hr over 60 Minutes Intravenous  Once 07/14/19 1510 07/14/19 1515   07/14/19 1515  ceFEPIme (MAXIPIME) 2 g in sodium chloride 0.9 % 100 mL IVPB  Status:  Discontinued     2 g 200 mL/hr over 30 Minutes Intravenous  Once 07/14/19 1510 07/14/19 1513        Objective:   Vitals:   07/15/19 0354 07/15/19 0800 07/15/19 0813 07/15/19 1430  BP: (!) 112/49 135/63    Pulse: 94 (!) 109    Resp: 20 20    Temp: (!) 97.4 F (36.3 C) 97.6 F (36.4 C)    TempSrc: Oral     SpO2: 97% 97% 96% 97%  Weight:      Height:        Wt Readings from Last 3 Encounters:  07/14/19 86.3 kg  07/14/19 85.7 kg  07/01/19 82.1 kg     Intake/Output Summary (Last 24 hours) at 07/15/2019 1903 Last data filed at 07/15/2019 0500 Gross per 24 hour  Intake 912.04 ml  Output 600 ml  Net 312.04 ml     Physical Exam  Gen:-Falls asleep easily when talking HEENT:- [place].AT, No sclera icterus Nose- [place] 3L/min Neck-Supple Neck,No JVD,.  Lungs-diminished breath sounds with scattered rhonchi and wheezes CV- S1, S2 normal, regular  Abd-  +ve B.Sounds, Abd Soft, No tenderness,    Extremity/Skin:- 1+  edema, pedal pulses present Psych-affect is appropriate, oriented x3 Neuro-generalized weakness, no new focal deficits, no tremors   Data Review:   Micro  Results Recent Results (from the past 240 hour(s))  SARS Coronavirus 2 by RT PCR (hospital order, performed in [facility] hospital lab) Nasopharyngeal Nasopharyngeal Swab     Status: None   Collection Time: 07/14/19  3:01 PM   Specimen: Nasopharyngeal Swab  Result Value Ref Range Status   SARS Coronavirus 2 NEGATIVE NEGATIVE Final    Comment: (NOTE) SARS-CoV-2 target nucleic acids are NOT DETECTED. The SARS-CoV-2 RNA is generally detectable in upper and lower respiratory specimens during the acute phase of infection. The lowest concentration of SARS-CoV-2 viral copies this assay can detect is 250 copies / mL. A negative result does not  preclude SARS-CoV-2 infection and should not be used as the sole basis for treatment or other patient management decisions.  A negative result may occur with improper specimen collection / handling, submission of specimen other than nasopharyngeal swab, presence of viral mutation(s) within the areas targeted by this assay, and inadequate number of viral copies (<250 copies / mL). A negative result must be combined with clinical observations, patient history, and epidemiological information. Fact Sheet for Patients:   StrictlyIdeas.no Fact Sheet for Healthcare Providers: BankingDealers.co.za This test is not yet approved or cleared  by the [place] FDA and has been authorized for detection and/or diagnosis of SARS-CoV-2 by FDA under an Emergency Use Authorization (EUA).  This EUA will remain in effect (meaning this test can be used) for the duration of the COVID-19 declaration under Section 564(b)(1) of the Act, 21 U.S.C. section 360bbb-3(b)(1), unless the authorization is terminated or revoked sooner. Performed at [facility], [address] [place], [place] [address]   MRSA PCR Screening     Status: None   Collection Time: 07/14/19  3:15 PM   Specimen: Nasopharyngeal  Result Value Ref Range  Status   MRSA by PCR NEGATIVE NEGATIVE Final    Comment:        The GeneXpert MRSA Assay (FDA approved for NASAL specimens only), is one component of a comprehensive MRSA colonization surveillance program. It is not intended to diagnose MRSA infection nor to guide or monitor treatment for MRSA infections. Performed at [facility], [address], [place], [place] [address]     Radiology Reports US Venous Img Lower Bilateral (DVT)  Result Date: 07/06/2019 CLINICAL DATA:  Bilateral lower extremity edema. EXAM: BILATERAL LOWER EXTREMITY VENOUS DOPPLER ULTRASOUND TECHNIQUE: Gray-scale sonography with graded compression, as well as color Doppler and duplex ultrasound were performed to evaluate the lower extremity deep venous systems from the level of the common femoral vein and including the common femoral, femoral, profunda femoral, popliteal and calf veins including the posterior tibial, peroneal and gastrocnemius veins when visible. The superficial great saphenous vein was also interrogated. Spectral Doppler was utilized to evaluate flow at rest and with distal augmentation maneuvers in the common femoral, femoral and popliteal veins. COMPARISON:  None. FINDINGS: RIGHT LOWER EXTREMITY Common Femoral Vein: No evidence of thrombus. Normal compressibility, respiratory phasicity and response to augmentation. Saphenofemoral Junction: No evidence of thrombus. Normal compressibility and flow on color Doppler imaging. Profunda Femoral Vein: No evidence of thrombus. Normal compressibility and flow on color Doppler imaging. Femoral Vein: No evidence of thrombus. Normal compressibility, respiratory phasicity and response to augmentation. Popliteal Vein: No evidence of thrombus. Normal compressibility, respiratory phasicity and response to augmentation. Calf Veins: No evidence of thrombus. Normal compressibility and flow on color Doppler imaging. Superficial Great Saphenous Vein: No evidence of thrombus. Normal  compressibility. Venous Reflux:  None. Other Findings:  None. LEFT LOWER EXTREMITY Common Femoral Vein: No evidence of thrombus. Normal compressibility, respiratory phasicity and response to augmentation. Saphenofemoral Junction: No evidence of thrombus. Normal compressibility and flow on color Doppler imaging. Profunda Femoral Vein: No evidence of thrombus. Normal compressibility and flow on color Doppler imaging. Femoral Vein: No evidence of thrombus. Normal compressibility, respiratory phasicity and response to augmentation. Popliteal Vein: No evidence of thrombus. Normal compressibility, respiratory phasicity and response to augmentation. Calf Veins: No evidence of thrombus. Normal compressibility and flow on color Doppler imaging. Superficial Great Saphenous Vein: No evidence of thrombus. Normal compressibility. Venous Reflux:  None. Other Findings: No evidence of superficial thrombophlebitis  or abnormal fluid collection. IMPRESSION: No evidence of deep venous thrombosis in either lower extremity. Electronically Signed   By: Aletta Edouard M.D.   On: 07/06/2019 10:15   US Venous Img Lower Bilateral (DVT)  Result Date: 06/16/2019 CLINICAL DATA:  Acute bilateral lower extremity pain. EXAM: Bilateral LOWER EXTREMITY VENOUS DOPPLER ULTRASOUND TECHNIQUE: Gray-scale sonography with compression, as well as color and duplex ultrasound, were performed to evaluate the deep venous system(s) from the level of the common femoral vein through the popliteal and proximal calf veins. COMPARISON:  None. FINDINGS: VENOUS Normal compressibility of the common femoral, superficial femoral, and popliteal veins, as well as the visualized calf veins. Visualized portions of profunda femoral vein and great saphenous vein unremarkable. No filling defects to suggest DVT on grayscale or color Doppler imaging. Doppler waveforms show normal direction of venous flow, normal respiratory phasicity and response to augmentation. Limited views  of the contralateral common femoral vein are unremarkable. OTHER None. Limitations: none IMPRESSION: No femoropopliteal DVT nor evidence of DVT within the visualized calf veins. If clinical symptoms are inconsistent or if there are persistent or worsening symptoms, further imaging (possibly involving the iliac veins) may be warranted. Electronically Signed   By: Marijo Conception M.D.   On: 06/16/2019 10:09   DG Chest Port 1 View  Result Date: 07/14/2019 CLINICAL DATA:  Shortness of breath EXAM: PORTABLE CHEST 1 VIEW COMPARISON:  July 01, 2019. chest radiograph and chest CT June 15, 2019 FINDINGS: There is bullous disease in the upper lobes, more severe on the right with postoperative changes and scarring in lung apices. There is an area of focal scarring in the lateral left mid lung. There is no edema or airspace opacity. The heart size is normal. The pulmonary vascularity reflects a underlying emphysematous change and is stable. No adenopathy. No bone lesions IMPRESSION: Extensive underlying emphysematous change with areas of bullous disease and scarring. No edema or airspace disease. Stable cardiac silhouette. No evident adenopathy. Emphysema (ICD10-J43.9). Electronically Signed   By: Lowella Grip III M.D.   On: 07/14/2019 14:04   DG Chest Portable 1 View  Result Date: 07/01/2019 CLINICAL DATA:  Shortness of breath. Hypoxia. EXAM: PORTABLE CHEST 1 VIEW COMPARISON:  CT chest 06/15/2019. One-view chest x-ray 06/13/2019. FINDINGS: Heart size is normal. Aortic atherosclerosis is again seen. Changes of severe COPD are evident. Scarring the left lung is stable. No acute superimposed disease is present. IMPRESSION: 1. Severe COPD. 2. No acute cardiopulmonary disease or significant interval change. Electronically Signed   By: San Morelle M.D.   On: 07/01/2019 11:44   EEG adult  Result Date: 06/16/2019 Lora Havens, MD     06/16/2019  4:56 PM Patient Name: ARIA HANNAN MRN: [id number]  Epilepsy Attending: Lora Havens Referring Physician/Provider: Dr. Shanon Brow Tat Date: 06/16/2019 Duration: 23.23 mins Patient history: 54 year old male admitted for shortness of breath.  Also noted to have intermittent bilateral hand weakness occasionally accompanied by confusion.  EEG to evaluate for seizures. Level of alertness: Awake AEDs during EEG study: Pregabalin Technical aspects: This EEG study was done with scalp electrodes positioned according to the 10-20 International system of electrode placement. Electrical activity was acquired at a sampling rate of 500Hz  and reviewed with a high frequency filter of 70Hz  and a low frequency filter of 1Hz . EEG data were recorded continuously and digitally stored. Description: No clear posterior dominant rhythm was seen. EEG showed continuous generalized polymorphic 3-6hz  theta-delta slowing. Hyperventilation and photic stimulation were not performed.  ABNORMALITY - Continuous slow, generalized IMPRESSION: This study is suggestive of moderate diffuse encephalopathy, non specific to etiology. No seizures or epileptiform discharges were seen throughout the recording. Lora Havens   ECHOCARDIOGRAM COMPLETE  Result Date: 06/16/2019    ECHOCARDIOGRAM REPORT   Patient Name:   SANTE NAGLE Date of Exam: 06/16/2019 Medical Rec #:  [id number]        Height:       73.0 in Accession #:    [id number]       Weight:       181.0 lb Date of Birth:  16-Oct-1965        BSA:          2.062 m Patient Age:    65 years         BP:           141/71 mmHg Patient Gender: M                HR:           94 bpm. Exam Location:  [facility] Procedure: 2D Echo Indications:    Dyspnea 786.09 / R06.00  History:        Patient has prior history of Echocardiogram examinations, most                 recent 07/24/2011. COPD, Signs/Symptoms:Chest Pain and Fever;                 Risk Factors:Current Smoker. Acute coronary syndrome , History                 of DVT , Pulmonary infection, Opiate abuse.   Sonographer:    Leavy Cella RDCS (AE) Referring Phys: [phone] DAVID TAT IMPRESSIONS  1. Left ventricular ejection fraction, by estimation, is 65 to 70%. The left ventricle has normal function. The left ventricle has no regional wall motion abnormalities. Left ventricular diastolic parameters are indeterminate.  2. Right ventricular systolic function is normal. The right ventricular size is normal. Tricuspid regurgitation signal is inadequate for assessing PA pressure.  3. The mitral valve is grossly normal. Trivial mitral valve regurgitation.  4. The aortic valve is tricuspid. Aortic valve regurgitation is not visualized.  5. The inferior vena cava is normal in size with greater than 50% respiratory variability, suggesting right atrial pressure of 3 mmHg. FINDINGS  Left Ventricle: Left ventricular ejection fraction, by estimation, is 65 to 70%. The left ventricle has normal function. The left ventricle has no regional wall motion abnormalities. The left ventricular internal cavity size was normal in size. There is  no left ventricular hypertrophy. Left ventricular diastolic parameters are indeterminate. Right Ventricle: The right ventricular size is normal. No increase in right ventricular wall thickness. Right ventricular systolic function is normal. Tricuspid regurgitation signal is inadequate for assessing PA pressure. Left Atrium: Left atrial size was normal in size. Right Atrium: Right atrial size was normal in size. Pericardium: There is no evidence of pericardial effusion. Presence of pericardial fat pad. Mitral Valve: The mitral valve is grossly normal. Trivial mitral valve regurgitation. Tricuspid Valve: The tricuspid valve is grossly normal. Tricuspid valve regurgitation is trivial. Aortic Valve: The aortic valve is tricuspid. Aortic valve regurgitation is not visualized. Mild aortic valve annular calcification. Pulmonic Valve: The pulmonic valve was not well visualized. Pulmonic valve regurgitation is  not visualized. Aorta: The aortic root is normal in size and structure. Venous: The inferior vena cava is normal in size with greater than 50%  respiratory variability, suggesting right atrial pressure of 3 mmHg. IAS/Shunts: No atrial level shunt detected by color flow Doppler.  LEFT VENTRICLE PLAX 2D LVIDd:         4.66 cm  Diastology LVIDs:         2.85 cm  LV e' lateral:   9.64 cm/s LV PW:         1.01 cm  LV E/e' lateral: 10.1 LV IVS:        0.93 cm  LV e' medial:    13.20 cm/s LVOT diam:     2.10 cm  LV E/e' medial:  7.4 LV SV:         77 LV SV Index:   37 LVOT Area:     3.46 cm  RIGHT VENTRICLE RV S prime:     24.20 cm/s TAPSE (M-mode): 2.6 cm LEFT ATRIUM             Index LA diam:        3.30 cm 1.60 cm/m LA Vol (A2C):   30.3 ml 14.69 ml/m LA Vol (A4C):   47.3 ml 22.94 ml/m LA Biplane Vol: 38.0 ml 18.43 ml/m  AORTIC VALVE LVOT Vmax:   120.00 cm/s LVOT Vmean:  75.400 cm/s LVOT VTI:    0.222 m  AORTA Ao Root diam: 3.00 cm MITRAL VALVE MV Area (PHT): 2.75 cm    SHUNTS MV Decel Time: 276 msec    Systemic VTI:  0.22 m MV E velocity: 97.30 cm/s  Systemic Diam: 2.10 cm MV A velocity: 49.60 cm/s MV E/A ratio:  1.96 Rozann Lesches MD Electronically signed by Rozann Lesches MD Signature Date/Time: 06/16/2019/11:49:28 AM    Final      CBC Recent Labs  Lab 07/14/19 1350 07/15/19 0548  WBC 16.1* 17.9*  HGB 11.6* 11.0*  HCT 38.8* 36.7*  PLT 382 326  MCV 101.8* 103.1*  MCH 30.4 30.9  MCHC 29.9* 30.0  RDW 13.6 13.2    Chemistries  Recent Labs  Lab 07/14/19 1350  NA 140  K 3.7  CL 91*  CO2 39*  GLUCOSE 117*  BUN 18  CREATININE 0.81  CALCIUM 9.1  AST 16  ALT 29  ALKPHOS 63  BILITOT 0.4   ------------------------------------------------------------------------------------------------------------------ No results for input(s): CHOL, HDL, LDLCALC, TRIG, CHOLHDL, LDLDIRECT in the last 72 hours.  Lab Results  Component Value Date   HGBA1C 5.6 06/15/2019    ------------------------------------------------------------------------------------------------------------------ No results for input(s): TSH, T4TOTAL, T3FREE, THYROIDAB in the last 72 hours.  Invalid input(s): FREET3 ------------------------------------------------------------------------------------------------------------------ No results for input(s): VITAMINB12, FOLATE, FERRITIN, TIBC, IRON, RETICCTPCT in the last 72 hours.  Coagulation profile No results for input(s): INR, PROTIME in the last 168 hours.  No results for input(s): DDIMER in the last 72 hours.  Cardiac Enzymes No results for input(s): CKMB, TROPONINI, MYOGLOBIN in the last 168 hours.  Invalid input(s): CK ------------------------------------------------------------------------------------------------------------------    Component Value Date/Time   BNP 38.0 07/14/2019 1356     Symphony Demuro M.D on 07/15/2019 at 7:03 PM  Go to [email or web address] - for contact info  [facility] - Office  [phone]

## 2019-07-15 NOTE — Progress Notes (Signed)
NAME:  Wesley [name], MRN:  [id number], DOB:  06/27/1965, LOS: 1 ADMISSION DATE:  07/14/2019, CONSULTATION DATE:  07/14/2019 REFERRING MD:  Dr. Denton Brick, [facility], CHIEF COMPLAINT:  Short of breath   Brief History   54 yo male former smoker with recurrent COPD exacerbation with Pseudomonal tracheobronchitis.  Past Medical History  Osteoporosis, Neuropathy, Chronic back pain, s/p Rt VATS bullectomy 01/16/13, s/p Lt VATS bullectomy 08/28/13, Asbestos exposure, Lt lung hernia, Chronic pain  Significant Hospital Events   5/10 Admit 5/11 episode of confusion  Consults:    Procedures:    Significant Diagnostic Tests:    Micro Data:  Sputum 4/29 >> Pseudomonas aeruginosa SARS CoV2 PCR 5/10 >> negative  Antimicrobials:  Vancomycin 5/10 >>  Cefepime 5/10 >>   Interim history/subjective:  Had episode of confusion overnight and then was more somnolent this morning.  He states he hadn't slept in several days and that is why he was more sleepy.  He also says he had to urinate in the garbage can because his bedside urinal was full and he couldn't walk to the bathroom while being connected to oxygen and IV.  Objective   Blood pressure 135/63, pulse (!) 109, temperature 97.6 F (36.4 C), resp. rate 20, height 6\' 1"  (1.854 m), weight 86.3 kg, SpO2 96 %.        Intake/Output Summary (Last 24 hours) at 07/15/2019 1425 Last data filed at 07/15/2019 0500 Gross per 24 hour  Intake 1312.04 ml  Output 600 ml  Net 712.04 ml   Filed Weights   07/14/19 1337 07/14/19 2011  Weight: 86.2 kg 86.3 kg    Examination:  General - alert Eyes - pupils reactive ENT - no sinus tenderness, no stridor Cardiac - regular rate/rhythm, no murmur Chest - decreased BS, faint b/l wheeze, scattered rhonchi Abdomen - soft, non tender, + bowel sounds Extremities - no cyanosis, clubbing, or edema Skin - no rashes Neuro - normal strength, moves extremities, follows commands Psych - normal mood and behavior   Resolved Hospital Problem list     Assessment & Plan:   Acute on chronic hypoxic/hypercapnic respiratory failure from Pseudomonal tracheobronchitis causing COPD exacerbation. - day 2 of IV Abx - continue solumedrol - continue BDs, singulair - goal SpO2 90 to 95%  Chronic pain. - discussed with him how it is a balance between controlling his pain with appropriate medication and avoiding overmedication with opiates that could negatively impact his breathing - d/w Dr. Denton Brick >> he will adjust pt's pain medication regimen   Best practice:  Diet: Regular diet DVT prophylaxis: lovenox GI prophylaxis: not indicated Mobility: As tolerated Code Status: Full code Disposition: telemetry  Labs:   CMP Latest Ref Rng & Units 07/14/2019 07/02/2019 07/01/2019  Glucose 70 - 99 mg/dL 117(H) 271(H) 144(H)  BUN 6 - 20 mg/dL 18 17 17   Creatinine 0.61 - 1.24 mg/dL 0.81 0.83 0.83  Sodium 135 - 145 mmol/L 140 136 137  Potassium 3.5 - 5.1 mmol/L 3.7 4.2 3.8  Chloride 98 - 111 mmol/L 91(L) 97(L) 95(L)  CO2 22 - 32 mmol/L 39(H) 27 32  Calcium 8.9 - 10.3 mg/dL 9.1 9.0 9.2  Total Protein 6.5 - 8.1 g/dL 6.4(L) - -  Total Bilirubin 0.3 - 1.2 mg/dL 0.4 - -  Alkaline Phos 38 - 126 U/L 63 - -  AST 15 - 41 U/L 16 - -  ALT 0 - 44 U/L 29 - -    CBC Latest Ref Rng & Units 07/15/2019  07/14/2019 07/02/2019  WBC 4.0 - 10.5 K/uL 17.9(H) 16.1(H) 11.8(H)  Hemoglobin 13.0 - 17.0 g/dL 11.0(L) 11.6(L) 12.2(L)  Hematocrit 39.0 - 52.0 % 36.7(L) 38.8(L) 40.5  Platelets 150 - 400 K/uL 326 382 242    ABG    Component Value Date/Time   PHART 7.329 (L) 07/15/2019 0948   PCO2ART 68.8 (HH) 07/15/2019 0948   PO2ART 82.2 (L) 07/15/2019 0948   HCO3 31.4 (H) 07/15/2019 0948   TCO2 21.5 02/24/2014 1558   O2SAT 95.7 07/15/2019 0948    Signature:  Chesley Mires, MD [place] Pager - [phone] 07/15/2019, 2:34 PM

## 2019-07-15 NOTE — Progress Notes (Signed)
Walked in to give pt his nebulizer treatment and found him on the side of the bed slumped over. When I called out to the pt he aroused. Pt stated he was fine and didn't know what I was talking about. I encouraged pt to get completely back in bed but he refused. Pt continued to nod off and catch him self during his nebulizer treatment. RN aware

## 2019-07-15 NOTE — Progress Notes (Signed)
Patient woke up disoriented and found urinating in trash can. Patient stated "I woke up and didn't know where I was for a minute." Patient oriented x4 at this time. Patient assisted to bed.  This nurse attempted to turn on bed alarm. Patient refusing for bed alarm to be placed on. Patient educated on safety precautions by multiple nursing staff members. Patient educated to call for assistance. Patient lying in bed at this time. Call bell within reach.

## 2019-07-15 NOTE — Progress Notes (Signed)
CRITICAL VALUE ALERT  Critical Value:  PCO2 68.8  Date & Time Notied:  07/15/2019 1001  Provider Notified: Dr. Joesph Fillers  Orders Received/Actions taken: no narcotics to be given at this time

## 2019-07-15 NOTE — TOC Initial Note (Signed)
Transition of Care [facility]) - Initial/Assessment Note   Patient Details  Name: Wesley [name] MRN: [id number] Date of Birth: [birth year]  [facility] CM/SW Contact:    Sherie Don, LCSW Phone Number: 07/15/2019, 6:54 PM  Clinical Narrative:  CSW met with patient to complete initial assessment. Patient is a 54 year old male who was admitted for COPD exacerbation. Per patient, he lives at home with his wife. He reports he keeps his oxygen on 24/7 and uses his nebulizer as prescribed. Patient reported he is in extreme pain and expressed frustrations with not receiving his pain medications, which he reports do not interfere with his breathing. Patient also reported he never received [place] services after he was discharged last week and thought [facility] was going to send an RN out to the home.           Expected Discharge Plan: Home/Self Care Barriers to Discharge: Continued Medical Work up  Patient Goals and CMS Choice Patient states their goals for this hospitalization and ongoing recovery are:: Return home CMS Medicare.gov Compare Post Acute Care list provided to:: Patient Choice offered to / list presented to : Patient  Expected Discharge Plan and Services Expected Discharge Plan: Home/Self Care Living arrangements for the past 2 months: Single Family Home DME Agency: [employer](Provides home O2)  Prior Living Arrangements/Services Living arrangements for the past 2 months: Single Family Home Lives with:: Spouse Patient language and need for interpreter reviewed:: Yes Do you feel safe going back to the place where you live?: Yes      Need for Family Participation in Patient Care: No (Comment) Care giver support system in place?: Yes (comment)(Margaret Urieta (wife) PH: [phone]) Current home services: DME(Home O2, walker, cane) Criminal Activity/Legal Involvement Pertinent to Current Situation/Hospitalization: No - Comment as needed  Activities of Daily Living Home  Assistive Devices/Equipment: Walker (specify type) ADL Screening (condition at time of admission) Patient's cognitive ability adequate to safely complete daily activities?: Yes Is the patient deaf or have difficulty hearing?: No Does the patient have difficulty seeing, even when wearing glasses/contacts?: No Does the patient have difficulty concentrating, remembering, or making decisions?: No Patient able to express need for assistance with ADLs?: Yes Does the patient have difficulty dressing or bathing?: No Independently performs ADLs?: Yes (appropriate for developmental age) Does the patient have difficulty walking or climbing stairs?: Yes Weakness of Legs: Both Weakness of Arms/Hands: None  Emotional Assessment Appearance:: Appears stated age Attitude/Demeanor/Rapport: Engaged Affect (typically observed): Appropriate Orientation: : Oriented to Self, Oriented to Place, Oriented to  Time, Oriented to Situation Alcohol / Substance Use: Not Applicable Psych Involvement: No (comment)  Admission diagnosis:  Productive cough [R05] COPD exacerbation ([place]) [J44.1] COPD with exacerbation ([place]) [J44.1] Acute on chronic respiratory failure with hypoxia ([place]) [J96.21] Patient Active Problem List   Diagnosis Date Noted  . COPD with exacerbation ([place]) 07/14/2019  . Productive cough   . Acute exacerbation of chronic obstructive pulmonary disease (COPD) ([place]) 07/01/2019  . Acute on chronic respiratory failure ([place]) 06/13/2019  . Acute on chronic respiratory failure with hypoxia ([place]) 06/13/2019  . Cellulitis of abdominal wall   . Leg pain   . SOB (shortness of breath) 01/09/2019  . Chronic respiratory failure with hypoxia ([place]) 01/09/2019  . CAP (community acquired pneumonia) 01/08/2019  . Normal coronary arteries 08/02/2016  . Atypical chest pain 07/20/2016  . Acute coronary syndrome ([place]) 07/19/2016  . Abnormal stress test 07/19/2016  . Unstable angina ([place]) 07/19/2016  . Sepsis (  [place])  03/30/2016  . HCAP (healthcare-associated pneumonia) 03/30/2016  . Hypokalemia 01/08/2016  . History of DVT (deep vein thrombosis) 01/08/2016  . OA (osteoarthritis) of hip 09/24/2015  . Community acquired pneumonia 04/14/2015  . Vomiting and diarrhea   . Solitary pulmonary nodule 05/18/2014  . Nausea and vomiting 05/16/2014  . Bronchitis, acute, with bronchospasm 02/24/2014  . Osteoporosis, unspecified 08/06/2012  . Hip pain 08/06/2012  . Opiate abuse, continuous ([place]) 01/05/2012  . Opiate dependence ([place]) 01/03/2012  . Chronic respiratory failure ([place]) 01/03/2012  . COPD exacerbation ([place]) 01/03/2012  . Pulmonary infection 01/03/2012  . Pneumonia 07/19/2011  . Fever 07/18/2011  . Myalgia 07/18/2011  . Leukocytosis 07/18/2011  . COPD (chronic obstructive pulmonary disease) ([place]) 07/18/2011  . Acute-on-chronic respiratory failure ([place]) 07/18/2011  . Tachycardia 07/18/2011  . Dehydration 07/18/2011  . Chronic pain 07/18/2011  . Tobacco abuse 07/18/2011   PCP:  [name], MD Pharmacy:   [facility] [place] [address] [place] [address] Phone: [phone] Fax: [phone]  Readmission Risk Interventions Readmission Risk Prevention Plan 07/03/2019  Transportation Screening Complete  PCP or Specialist Appt within 3-5 Days Complete  Social Work Consult for [place] Planning/Counseling Complete  Palliative Care Screening Not Applicable  Medication Review Press [employer]) Complete  Some recent data might be hidden

## 2019-07-16 LAB — GLUCOSE, CAPILLARY
Glucose-Capillary: 138 mg/dL — ABNORMAL HIGH (ref 70–99)
Glucose-Capillary: 203 mg/dL — ABNORMAL HIGH (ref 70–99)
Glucose-Capillary: 275 mg/dL — ABNORMAL HIGH (ref 70–99)
Glucose-Capillary: 187 mg/dL — ABNORMAL HIGH (ref 70–99)

## 2019-07-16 LAB — EXPECTORATED SPUTUM ASSESSMENT W GRAM STAIN, RFLX TO RESP C

## 2019-07-16 LAB — CREATININE, SERUM
Creatinine, Ser: 0.63 mg/dL (ref 0.61–1.24)
GFR calc Af Amer: >60 mL/min (ref >60)
GFR calc non Af Amer: >60 mL/min (ref >60)

## 2019-07-16 MED ORDER — OXYCODONE HCL 5 MG PO TABS
30.0000 mg | ORAL_TABLET | ORAL | Status: DC | PRN
  Administered 2019-07-16 – 2019-07-18 (×12): 30 mg via ORAL
  Filled 2019-07-16 (×13): qty 6

## 2019-07-16 MED ORDER — IPRATROPIUM-ALBUTEROL 0.5-2.5 (3) MG/3ML IN SOLN
3.0000 mL | RESPIRATORY_TRACT | Status: DC
  Administered 2019-07-16 – 2019-07-18 (×11): 3 mL via RESPIRATORY_TRACT
  Filled 2019-07-16 (×11): qty 3

## 2019-07-16 MED ORDER — OXYCODONE HCL 5 MG PO TABS: 30.0000 mg | ORAL_TABLET | ORAL | Status: DC

## 2019-07-16 MED ORDER — IPRATROPIUM-ALBUTEROL 0.5-2.5 (3) MG/3ML IN SOLN
3.0000 mL | RESPIRATORY_TRACT | Status: DC
  Administered 2019-07-16: 3 mL via RESPIRATORY_TRACT
  Filled 2019-07-16: qty 3

## 2019-07-16 MED ORDER — MORPHINE SULFATE ER 30 MG PO TBCR
30.0000 mg | EXTENDED_RELEASE_TABLET | Freq: Three times a day (TID) | ORAL | Status: DC
  Administered 2019-07-16 – 2019-07-18 (×7): 30 mg via ORAL
  Filled 2019-07-16 (×7): qty 1

## 2019-07-16 MED ORDER — BUDESONIDE 0.5 MG/2ML IN SUSP
0.5000 mg | Freq: Two times a day (BID) | RESPIRATORY_TRACT | Status: DC
  Administered 2019-07-16 – 2019-07-18 (×4): 0.5 mg via RESPIRATORY_TRACT
  Filled 2019-07-16 (×4): qty 2

## 2019-07-16 NOTE — Care Management Important Message (Signed)
Important Message  Patient Details  Name: Wesley [name] MRN: [id number] Date of Birth: [birth year]   Medicare Important Message Given:  Yes     Tommy Medal 07/16/2019, 4:22 PM

## 2019-07-16 NOTE — Progress Notes (Signed)
NAME:  Wesley [name], MRN:  [id number], DOB:  14-Oct-1965, LOS: 2 ADMISSION DATE:  07/14/2019, CONSULTATION DATE:  07/14/2019 REFERRING MD:  Dr. [place] Brick, [facility], CHIEF COMPLAINT:  Short of breath   Brief History   54 yo male former smoker with recurrent COPD exacerbation with Pseudomonal tracheobronchitis.  Past Medical History  Osteoporosis, Neuropathy, Chronic back pain, s/p Rt VATS bullectomy 01/16/13, s/p Lt VATS bullectomy 08/28/13, Asbestos exposure, Lt lung hernia, Chronic pain  Significant Hospital Events   5/10 Admit 5/11 episode of confusion 5/12 change back to home pain regimen  Consults:    Procedures:    Significant Diagnostic Tests:    Micro Data:  Sputum 4/29 >> Pseudomonas aeruginosa SARS CoV2 PCR 5/10 >> negative  Antimicrobials:  Vancomycin 5/10 >> 5/11 Cefepime 5/10 >>   Interim history/subjective:  He feels miserable.  He wants his home pain medication regimen.  He feels he needs inhaler therapy more frequently also.  Objective   Blood pressure 105/61, pulse (!) 114, temperature 98.6 F (37 C), temperature source Oral, resp. rate 16, height 6\' 1"  (1.854 m), weight 86.3 kg, SpO2 97 %.        Intake/Output Summary (Last 24 hours) at 07/16/2019 1427 Last data filed at 07/16/2019 1100 Gross per 24 hour  Intake 540 ml  Output 3600 ml  Net -3060 ml   Filed Weights   07/14/19 1337 07/14/19 2011  Weight: 86.2 kg 86.3 kg    Examination:  General - alert Eyes - pupils reactive ENT - no sinus tenderness, no stridor Cardiac - regular rate/rhythm, no murmur Chest - prolonged exhalation, faint b/l wheeze Abdomen - soft, non tender, + bowel sounds Extremities - no cyanosis, clubbing, or edema Skin - no rashes Neuro - normal strength, moves extremities, follows commands Psych - anxious   Resolved Hospital Problem list     Assessment & Plan:   Acute on chronic hypoxic/hypercapnic respiratory failure from Pseudomonal tracheobronchitis causing  COPD exacerbation. - day 3 of ABx - continue solumedrol - increase dose of pulmicort - change duoneb to q4h - prn albuterol - explained that he should not use home inhalers while in hospital - continue singulair - goal SpO2 90 to 95%  Chronic pain. - will change back to his home pain medicine regimen  D/w Dr. [place] Brick.  Best practice:  Diet: Regular diet DVT prophylaxis: lovenox GI prophylaxis: not indicated Mobility: As tolerated Code Status: Full code Disposition: telemetry  Labs:   CMP Latest Ref Rng & Units 07/16/2019 07/14/2019 07/02/2019  Glucose 70 - 99 mg/dL - 117(H) 271(H)  BUN 6 - 20 mg/dL - 18 17  Creatinine 0.61 - 1.24 mg/dL 0.63 0.81 0.83  Sodium 135 - 145 mmol/L - 140 136  Potassium 3.5 - 5.1 mmol/L - 3.7 4.2  Chloride 98 - 111 mmol/L - 91(L) 97(L)  CO2 22 - 32 mmol/L - 39(H) 27  Calcium 8.9 - 10.3 mg/dL - 9.1 9.0  Total Protein 6.5 - 8.1 g/dL - 6.4(L) -  Total Bilirubin 0.3 - 1.2 mg/dL - 0.4 -  Alkaline Phos 38 - 126 U/L - 63 -  AST 15 - 41 U/L - 16 -  ALT 0 - 44 U/L - 29 -    CBC Latest Ref Rng & Units 07/15/2019 07/14/2019 07/02/2019  WBC 4.0 - 10.5 K/uL 17.9(H) 16.1(H) 11.8(H)  Hemoglobin 13.0 - 17.0 g/dL 11.0(L) 11.6(L) 12.2(L)  Hematocrit 39.0 - 52.0 % 36.7(L) 38.8(L) 40.5  Platelets 150 - 400 K/uL 326 382  242    ABG    Component Value Date/Time   PHART 7.329 (L) 07/15/2019 0948   PCO2ART 68.8 (HH) 07/15/2019 0948   PO2ART 82.2 (L) 07/15/2019 0948   HCO3 31.4 (H) 07/15/2019 0948   TCO2 21.5 02/24/2014 1558   O2SAT 95.7 07/15/2019 0948    Signature:  Chesley Mires, MD [place] Pager - [phone] 07/16/2019, 2:27 PM

## 2019-07-16 NOTE — Progress Notes (Signed)
Upon entering room to give neb treatment patient found to be sitting on the side of the bed slumped over falling asleep. RT was able to arouse patient by calling his name. During treatment patient continued to nod off while sitting on the side of the bed. RT offered to help patient lay flat on bed to prevent falling but patient declines stating he is ok and will not fall. RT will make RN aware.

## 2019-07-16 NOTE — Progress Notes (Signed)
Patient Demographics:    Wesley [name], is a 54 y.o. male, DOB - 08/18/1965, [place]:[id number]  Admit date - 07/14/2019   Admitting Physician Roney Jaffe, MD  Outpatient Primary MD for the patient is [name], MD  LOS - 2   Chief Complaint  Patient presents with  . Shortness of Breath        Subjective:    Wesley [name] today has no fevers, no emesis,  No chest pain,    -Patient is very very unhappy with this pain medication regimen--patient is threatening to have his wife bring in his narcotics from home so he can take additional pills -She requested that Dr. Halford Chessman has change his pain regimen back  --Shortness of breath and wheezing persist patient using his own inhaler and still getting respiratory therapy to give him breathing treatments.-- -Patient repeatedly told not to use his own inhaler while in the hospital as respiratory therapist is giving him bronchodilators through nebulizer -Is not very compliant  Assessment  & Plan :    Active Problems:   Opiate dependence (HCC)   Chronic respiratory failure (HCC)   SOB (shortness of breath)   Acute exacerbation of chronic obstructive pulmonary disease (COPD) (HCC)   COPD with exacerbation (HCC)  Brief Summary:- 54 yo male former smoker with recurrent COPD exacerbation with Pseudomonal tracheobronchitis and chronic opiate use with hypoxic and hypercapnic respiratory failure readmitted on 07/14/2019 with worsening hypoxia and worsening hypercapnia  A/p 1) acute on chronic hypoxic and hypercapnic respiratory failure--- ABG noted -Avoid excessive opiates as this depress respiration and lead to higher CO2 levels -Pulmonology consult appreciated discussed with Dr. Halford Chessman -Continue cefepime for Pseudomonas tracheobronchitis -Continue IV Solu-Medrol bronchodilators and mucolytics as ordered Patient remains asymptomatic from a respiratory  standpoint requesting multiple doses of bronchodilators back-to-back   2) chronic pain syndrome/chronic back pain----opiate regimen adjusted due to excessive sleepiness and hypercapnia -Patient is argumentative and unhappy about any adjustment to his opiate regimen, --Dr. Halford Chessman changed patient pain medication back to the way patient wanted it to be  3) social/ethics--patient is a full code  4) chronic lower extremity edema presumably from venous stasis--- TED stockings as ordered  Disposition/Need for in-Hospital Stay- patient unable to be discharged at this time due to --Pseudomonas lung infection requiring IV cefepime as well as worsening hypoxia and hypercapnia requiring bronchodilators and supplemental oxygen -Patient requiring back-to-back bronchodilator treatments  -Patient From: home D/C Place: home Barriers: Not Clinically Stable-   Code Status : full  Family Communication:   NA (patient is alert, awake and coherent)  Consults  :  pulm  DVT Prophylaxis  :  Lovenox - - SCDs   Lab Results  Component Value Date   PLT 326 07/15/2019    Inpatient Medications  Scheduled Meds: . aspirin  81 mg Oral Daily  . budesonide (PULMICORT) nebulizer solution  0.5 mg Nebulization BID  . enoxaparin (LOVENOX) injection  40 mg Subcutaneous Q24H  . furosemide  40 mg Oral Daily  . guaiFENesin  600 mg Oral BID  . ipratropium-albuterol  3 mL Nebulization Q4H WA  . methylPREDNISolone (SOLU-MEDROL) injection  60 mg Intravenous Q6H  . montelukast  10 mg Oral Daily  . morphine  30 mg Oral Q8H  .  nicotine  14 mg Transdermal Daily  . polyethylene glycol  17 g Oral Daily  . pregabalin  150 mg Oral TID  . sodium chloride flush  3 mL Intravenous Q12H   Continuous Infusions: . sodium chloride    . ceFEPime (MAXIPIME) IV 2 g (07/16/19 1751)   PRN Meds:.sodium chloride, acetaminophen **OR** acetaminophen, albuterol, guaiFENesin-dextromethorphan, HYDROcodone-homatropine, ketorolac, ondansetron  **OR** ondansetron (ZOFRAN) IV, ondansetron, oxycodone, polyethylene glycol, sodium chloride flush    Anti-infectives (From admission, onward)   Start     Dose/Rate Route Frequency Ordered Stop   07/15/19 0500  vancomycin (VANCOREADY) IVPB 1250 mg/250 mL  Status:  Discontinued     1,250 mg 166.7 mL/hr over 90 Minutes Intravenous Every 12 hours 07/14/19 1520 07/16/19 1056   07/15/19 0000  ceFEPIme (MAXIPIME) 2 g in sodium chloride 0.9 % 100 mL IVPB  Status:  Discontinued     2 g 200 mL/hr over 30 Minutes Intravenous Every 8 hours 07/14/19 1516 07/14/19 1524   07/14/19 1800  ceFEPIme (MAXIPIME) 2 g in sodium chloride 0.9 % 100 mL IVPB     2 g 200 mL/hr over 30 Minutes Intravenous Every 8 hours 07/14/19 1648     07/14/19 1600  ceFEPIme (MAXIPIME) 2 g in sodium chloride 0.9 % 100 mL IVPB  Status:  Discontinued     2 g 200 mL/hr over 30 Minutes Intravenous Every 8 hours 07/14/19 1524 07/14/19 1648   07/14/19 1530  vancomycin (VANCOREADY) IVPB 2000 mg/400 mL     2,000 mg 200 mL/hr over 120 Minutes Intravenous  Once 07/14/19 1515 07/14/19 1728   07/14/19 1515  vancomycin (VANCOCIN) IVPB 1000 mg/200 mL premix  Status:  Discontinued     1,000 mg 200 mL/hr over 60 Minutes Intravenous  Once 07/14/19 1510 07/14/19 1515   07/14/19 1515  ceFEPIme (MAXIPIME) 2 g in sodium chloride 0.9 % 100 mL IVPB  Status:  Discontinued     2 g 200 mL/hr over 30 Minutes Intravenous  Once 07/14/19 1510 07/14/19 1513        Objective:   Vitals:   07/16/19 1547 07/16/19 1604 07/16/19 1806 07/16/19 1918  BP:  (!) 124/99 129/85   Pulse:  (!) 111 (!) 111   Resp:  17 17   Temp:  98.5 F (36.9 C) 98.2 F (36.8 C)   TempSrc:  Oral Oral   SpO2: 98% 99% 99% 98%  Weight:      Height:        Wt Readings from Last 3 Encounters:  07/14/19 86.3 kg  07/14/19 85.7 kg  07/01/19 82.1 kg     Intake/Output Summary (Last 24 hours) at 07/16/2019 2014 Last data filed at 07/16/2019 1700 Gross per 24 hour  Intake 1020  ml  Output 3100 ml  Net -2080 ml     Physical Exam  Gen:-Unhappy about his pain regimen ,   HEENT:- [place].AT, No sclera icterus Nose- [place] 3L/min Neck-Supple Neck,No JVD,.  Lungs-diminished breath sounds with scattered rhonchi and wheezes CV- S1, S2 normal, regular  Abd-  +ve B.Sounds, Abd Soft, No tenderness,    Extremity/Skin:- 1+  edema, pedal pulses present Psych-affect is appropriate, oriented x3 Neuro-generalized weakness, no new focal deficits, no tremors   Data Review:   Micro Results Recent Results (from the past 240 hour(s))  SARS Coronavirus 2 by RT PCR (hospital order, performed in [facility] hospital lab) Nasopharyngeal Nasopharyngeal Swab     Status: None   Collection Time: 07/14/19  3:01 PM  Specimen: Nasopharyngeal Swab  Result Value Ref Range Status   SARS Coronavirus 2 NEGATIVE NEGATIVE Final    Comment: (NOTE) SARS-CoV-2 target nucleic acids are NOT DETECTED. The SARS-CoV-2 RNA is generally detectable in upper and lower respiratory specimens during the acute phase of infection. The lowest concentration of SARS-CoV-2 viral copies this assay can detect is 250 copies / mL. A negative result does not preclude SARS-CoV-2 infection and should not be used as the sole basis for treatment or other patient management decisions.  A negative result may occur with improper specimen collection / handling, submission of specimen other than nasopharyngeal swab, presence of viral mutation(s) within the areas targeted by this assay, and inadequate number of viral copies (<250 copies / mL). A negative result must be combined with clinical observations, patient history, and epidemiological information. Fact Sheet for Patients:   StrictlyIdeas.no Fact Sheet for Healthcare Providers: BankingDealers.co.za This test is not yet approved or cleared  by the [place] FDA and has been authorized for detection and/or diagnosis of  SARS-CoV-2 by FDA under an Emergency Use Authorization (EUA).  This EUA will remain in effect (meaning this test can be used) for the duration of the COVID-19 declaration under Section 564(b)(1) of the Act, 21 U.S.C. section 360bbb-3(b)(1), unless the authorization is terminated or revoked sooner. Performed at [facility], [address] [place], [place] [address]   MRSA PCR Screening     Status: None   Collection Time: 07/14/19  3:15 PM   Specimen: Nasopharyngeal  Result Value Ref Range Status   MRSA by PCR NEGATIVE NEGATIVE Final    Comment:        The GeneXpert MRSA Assay (FDA approved for NASAL specimens only), is one component of a comprehensive MRSA colonization surveillance program. It is not intended to diagnose MRSA infection nor to guide or monitor treatment for MRSA infections. Performed at [facility], [address], [place],  [address]   Expectorated sputum assessment w rflx to resp cult     Status: None   Collection Time: 07/16/19  5:48 AM   Specimen: Expectorated Sputum  Result Value Ref Range Status   Specimen Description EXPECTORATED SPUTUM  Final   Special Requests NONE  Final   Sputum evaluation   Final    THIS SPECIMEN IS ACCEPTABLE FOR SPUTUM CULTURE Performed at [facility], [address], [place],  [address]    Report Status 07/16/2019 FINAL  Final    Radiology Reports US Venous Img Lower Bilateral (DVT)  Result Date: 07/06/2019 CLINICAL DATA:  Bilateral lower extremity edema. EXAM: BILATERAL LOWER EXTREMITY VENOUS DOPPLER ULTRASOUND TECHNIQUE: Gray-scale sonography with graded compression, as well as color Doppler and duplex ultrasound were performed to evaluate the lower extremity deep venous systems from the level of the common femoral vein and including the common femoral, femoral, profunda femoral, popliteal and calf veins including the posterior tibial, peroneal and gastrocnemius veins when visible. The superficial great saphenous  vein was also interrogated. Spectral Doppler was utilized to evaluate flow at rest and with distal augmentation maneuvers in the common femoral, femoral and popliteal veins. COMPARISON:  None. FINDINGS: RIGHT LOWER EXTREMITY Common Femoral Vein: No evidence of thrombus. Normal compressibility, respiratory phasicity and response to augmentation. Saphenofemoral Junction: No evidence of thrombus. Normal compressibility and flow on color Doppler imaging. Profunda Femoral Vein: No evidence of thrombus. Normal compressibility and flow on color Doppler imaging. Femoral Vein: No evidence of thrombus. Normal compressibility, respiratory phasicity and response to augmentation. Popliteal Vein:  No evidence of thrombus. Normal compressibility, respiratory phasicity and response to augmentation. Calf Veins: No evidence of thrombus. Normal compressibility and flow on color Doppler imaging. Superficial Great Saphenous Vein: No evidence of thrombus. Normal compressibility. Venous Reflux:  None. Other Findings:  None. LEFT LOWER EXTREMITY Common Femoral Vein: No evidence of thrombus. Normal compressibility, respiratory phasicity and response to augmentation. Saphenofemoral Junction: No evidence of thrombus. Normal compressibility and flow on color Doppler imaging. Profunda Femoral Vein: No evidence of thrombus. Normal compressibility and flow on color Doppler imaging. Femoral Vein: No evidence of thrombus. Normal compressibility, respiratory phasicity and response to augmentation. Popliteal Vein: No evidence of thrombus. Normal compressibility, respiratory phasicity and response to augmentation. Calf Veins: No evidence of thrombus. Normal compressibility and flow on color Doppler imaging. Superficial Great Saphenous Vein: No evidence of thrombus. Normal compressibility. Venous Reflux:  None. Other Findings: No evidence of superficial thrombophlebitis or abnormal fluid collection. IMPRESSION: No evidence of deep venous thrombosis in  either lower extremity. Electronically Signed   By: Aletta Edouard M.D.   On: 07/06/2019 10:15   DG Chest Port 1 View  Result Date: 07/14/2019 CLINICAL DATA:  Shortness of breath EXAM: PORTABLE CHEST 1 VIEW COMPARISON:  July 01, 2019. chest radiograph and chest CT June 15, 2019 FINDINGS: There is bullous disease in the upper lobes, more severe on the right with postoperative changes and scarring in lung apices. There is an area of focal scarring in the lateral left mid lung. There is no edema or airspace opacity. The heart size is normal. The pulmonary vascularity reflects a underlying emphysematous change and is stable. No adenopathy. No bone lesions IMPRESSION: Extensive underlying emphysematous change with areas of bullous disease and scarring. No edema or airspace disease. Stable cardiac silhouette. No evident adenopathy. Emphysema (ICD10-J43.9). Electronically Signed   By: Lowella Grip III M.D.   On: 07/14/2019 14:04   DG Chest Portable 1 View  Result Date: 07/01/2019 CLINICAL DATA:  Shortness of breath. Hypoxia. EXAM: PORTABLE CHEST 1 VIEW COMPARISON:  CT chest 06/15/2019. One-view chest x-ray 06/13/2019. FINDINGS: Heart size is normal. Aortic atherosclerosis is again seen. Changes of severe COPD are evident. Scarring the left lung is stable. No acute superimposed disease is present. IMPRESSION: 1. Severe COPD. 2. No acute cardiopulmonary disease or significant interval change. Electronically Signed   By: San Morelle M.D.   On: 07/01/2019 11:44     CBC Recent Labs  Lab 07/14/19 1350 07/15/19 0548  WBC 16.1* 17.9*  HGB 11.6* 11.0*  HCT 38.8* 36.7*  PLT 382 326  MCV 101.8* 103.1*  MCH 30.4 30.9  MCHC 29.9* 30.0  RDW 13.6 13.2    Chemistries  Recent Labs  Lab 07/14/19 1350 07/16/19 0438  NA 140  --   K 3.7  --   CL 91*  --   CO2 39*  --   GLUCOSE 117*  --   BUN 18  --   CREATININE 0.81 0.63  CALCIUM 9.1  --   AST 16  --   ALT 29  --   ALKPHOS 63  --     BILITOT 0.4  --    ------------------------------------------------------------------------------------------------------------------ No results for input(s): CHOL, HDL, LDLCALC, TRIG, CHOLHDL, LDLDIRECT in the last 72 hours.  Lab Results  Component Value Date   HGBA1C 5.6 06/15/2019   ------------------------------------------------------------------------------------------------------------------ No results for input(s): TSH, T4TOTAL, T3FREE, THYROIDAB in the last 72 hours.  Invalid input(s): FREET3 ------------------------------------------------------------------------------------------------------------------ No results for input(s): VITAMINB12, FOLATE, FERRITIN, TIBC, IRON, RETICCTPCT  in the last 72 hours.  Coagulation profile No results for input(s): INR, PROTIME in the last 168 hours.  No results for input(s): DDIMER in the last 72 hours.  Cardiac Enzymes No results for input(s): CKMB, TROPONINI, MYOGLOBIN in the last 168 hours.  Invalid input(s): CK ------------------------------------------------------------------------------------------------------------------    Component Value Date/Time   BNP 38.0 07/14/2019 1356     Juliya Magill M.D on 07/16/2019 at 8:14 PM  Go to [email or web address] - for contact info  [facility] - Office  [phone]

## 2019-07-16 NOTE — Progress Notes (Signed)
Notified by RT that patient falling asleep sitting on side of bed. Upon entering room patient alert and sitting on side of the bed. Patient educated on safety precautions. Patient refusing for bed alarm to be turned on. Patient was agreeable to lay down in bed. Patient educated to call for assistance. Call bell within reach.

## 2019-07-17 DIAGNOSIS — J9621 Acute and chronic respiratory failure with hypoxia: Secondary | ICD-10-CM

## 2019-07-17 LAB — GLUCOSE, CAPILLARY
Glucose-Capillary: 161 mg/dL — ABNORMAL HIGH (ref 70–99)
Glucose-Capillary: 129 mg/dL — ABNORMAL HIGH (ref 70–99)
Glucose-Capillary: 181 mg/dL — ABNORMAL HIGH (ref 70–99)

## 2019-07-17 MED ORDER — INSULIN ASPART 100 UNIT/ML ~~LOC~~ SOLN
0.0000 [IU] | Freq: Three times a day (TID) | SUBCUTANEOUS | Status: DC
  Administered 2019-07-17 – 2019-07-18 (×2): 1 [IU] via SUBCUTANEOUS
  Administered 2019-07-18: 2 [IU] via SUBCUTANEOUS

## 2019-07-17 MED ORDER — INSULIN ASPART 100 UNIT/ML ~~LOC~~ SOLN: 0.0000 [IU] | Freq: Every day | SUBCUTANEOUS | Status: DC

## 2019-07-17 NOTE — Progress Notes (Signed)
Patient Demographics:    Wesley [name], is a 54 y.o. male, DOB - [birth year], [id number]  Admit date - 07/14/2019   Admitting Physician Roney Jaffe, MD  Outpatient Primary MD for the patient is [name], MD  LOS - 3   Chief Complaint  Patient presents with  . Shortness of Breath        Subjective:    [name] today has no fevers, no emesis,  No chest pain,    Cough, wheezing and shob persist, not worse   Assessment  & Plan :    Active Problems:   Opiate dependence (HCC)   Chronic respiratory failure (HCC)   SOB (shortness of breath)   Acute exacerbation of chronic obstructive pulmonary disease (COPD) (HCC)   COPD with exacerbation (HCC)  Brief Summary:- 54 yo male former smoker with recurrent COPD exacerbation with Pseudomonal tracheobronchitis and chronic opiate use with hypoxic and hypercapnic respiratory failure readmitted on 07/14/2019 with worsening hypoxia and worsening hypercapnia  A/p 1)Acute on chronic hypoxic and hypercapnic respiratory failure--- ABG noted -Avoid excessive opiates as this depress respiration and lead to higher CO2 levels -Pulmonology consult appreciated discussed with Dr. Halford Chessman -Continue cefepime day # 4  for Pseudomonas day  tracheobronchitis -Continue IV Solu-Medrol bronchodilators and mucolytics as ordered Patient remains rather symptomatic from a respiratory standpoint requesting multiple doses of bronchodilators back-to-back  2) chronic pain syndrome/chronic back pain----opiate regimen adjusted due to excessive sleepiness and hypercapnia -Patient is argumentative and unhappy about any adjustment to his opiate regimen, --Dr. Halford Chessman changed patient pain medication back to the way patient wanted it to be  3)Social/ethics--patient is a full code  4) chronic lower extremity edema presumably from venous stasis--- patient declined TED stockings, I  applied some Ace wraps  5)Hyperglycemia--steroid induced, Use Novolog/Humalog Sliding scale insulin with Accu-Cheks/Fingersticks as ordered    Disposition/Need for in-Hospital Stay- patient unable to be discharged at this time due to --Pseudomonas lung infection requiring IV cefepime as well as worsening hypoxia and hypercapnia requiring bronchodilators and supplemental oxygen -Patient requiring back-to-back bronchodilator treatments  -Patient From: home D/C Place: home Barriers: Not Clinically Stable-   Code Status : full  Family Communication:   NA (patient is alert, awake and coherent)  Consults  :  pulm  DVT Prophylaxis  :  Lovenox - - SCDs   Lab Results  Component Value Date   PLT 326 07/15/2019    Inpatient Medications  Scheduled Meds: . aspirin  81 mg Oral Daily  . budesonide (PULMICORT) nebulizer solution  0.5 mg Nebulization BID  . enoxaparin (LOVENOX) injection  40 mg Subcutaneous Q24H  . furosemide  40 mg Oral Daily  . guaiFENesin  600 mg Oral BID  . insulin aspart  0-5 Units Subcutaneous QHS  . insulin aspart  0-6 Units Subcutaneous TID WC  . ipratropium-albuterol  3 mL Nebulization Q4H WA  . methylPREDNISolone (SOLU-MEDROL) injection  60 mg Intravenous Q6H  . montelukast  10 mg Oral Daily  . morphine  30 mg Oral Q8H  . nicotine  14 mg Transdermal Daily  . polyethylene glycol  17 g Oral Daily  . pregabalin  150 mg Oral TID  . sodium chloride flush  3 mL Intravenous Q12H   Continuous  Infusions: . sodium chloride    . ceFEPime (MAXIPIME) IV 2 g (07/17/19 1027)   PRN Meds:.sodium chloride, acetaminophen **OR** acetaminophen, albuterol, guaiFENesin-dextromethorphan, HYDROcodone-homatropine, ketorolac, ondansetron **OR** ondansetron (ZOFRAN) IV, ondansetron, oxycodone, polyethylene glycol, sodium chloride flush    Anti-infectives (From admission, onward)   Start     Dose/Rate Route Frequency Ordered Stop   07/15/19 0500  vancomycin (VANCOREADY) IVPB 1250  mg/250 mL  Status:  Discontinued     1,250 mg 166.7 mL/hr over 90 Minutes Intravenous Every 12 hours 07/14/19 1520 07/16/19 1056   07/15/19 0000  ceFEPIme (MAXIPIME) 2 g in sodium chloride 0.9 % 100 mL IVPB  Status:  Discontinued     2 g 200 mL/hr over 30 Minutes Intravenous Every 8 hours 07/14/19 1516 07/14/19 1524   07/14/19 1800  ceFEPIme (MAXIPIME) 2 g in sodium chloride 0.9 % 100 mL IVPB     2 g 200 mL/hr over 30 Minutes Intravenous Every 8 hours 07/14/19 1648     07/14/19 1600  ceFEPIme (MAXIPIME) 2 g in sodium chloride 0.9 % 100 mL IVPB  Status:  Discontinued     2 g 200 mL/hr over 30 Minutes Intravenous Every 8 hours 07/14/19 1524 07/14/19 1648   07/14/19 1530  vancomycin (VANCOREADY) IVPB 2000 mg/400 mL     2,000 mg 200 mL/hr over 120 Minutes Intravenous  Once 07/14/19 1515 07/14/19 1728   07/14/19 1515  vancomycin (VANCOCIN) IVPB 1000 mg/200 mL premix  Status:  Discontinued     1,000 mg 200 mL/hr over 60 Minutes Intravenous  Once 07/14/19 1510 07/14/19 1515   07/14/19 1515  ceFEPIme (MAXIPIME) 2 g in sodium chloride 0.9 % 100 mL IVPB  Status:  Discontinued     2 g 200 mL/hr over 30 Minutes Intravenous  Once 07/14/19 1510 07/14/19 1513        Objective:   Vitals:   07/17/19 0323 07/17/19 0528 07/17/19 0814 07/17/19 0822  BP:  125/65    Pulse:  94    Resp:  20    Temp:  98.1 F (36.7 C)    TempSrc:  Oral    SpO2: 98% 97% 98% 100%  Weight:      Height:        Wt Readings from Last 3 Encounters:  07/14/19 86.3 kg  07/14/19 85.7 kg  07/01/19 82.1 kg     Intake/Output Summary (Last 24 hours) at 07/17/2019 1149 Last data filed at 07/17/2019 0900 Gross per 24 hour  Intake 1720.95 ml  Output 1350 ml  Net 370.95 ml     Physical Exam  Gen:-Unhappy about his pain regimen ,   HEENT:- [place].AT, No sclera icterus Nose- [place] 3L/min Neck-Supple Neck,No JVD,.  Lungs-diminished breath sounds with scattered rhonchi and wheezes, no Rales CV- S1, S2 normal, regular  Abd-   +ve B.Sounds, Abd Soft, No tenderness,    Extremity/Skin:- 1+  edema, pedal pulses present Psych-affect is appropriate, oriented x3 Neuro-generalized weakness, no new focal deficits, no tremors   Data Review:   Micro Results Recent Results (from the past 240 hour(s))  SARS Coronavirus 2 by RT PCR (hospital order, performed in [facility] hospital lab) Nasopharyngeal Nasopharyngeal Swab     Status: None   Collection Time: 07/14/19  3:01 PM   Specimen: Nasopharyngeal Swab  Result Value Ref Range Status   SARS Coronavirus 2 NEGATIVE NEGATIVE Final    Comment: (NOTE) SARS-CoV-2 target nucleic acids are NOT DETECTED. The SARS-CoV-2 RNA is generally detectable in upper and  lower respiratory specimens during the acute phase of infection. The lowest concentration of SARS-CoV-2 viral copies this assay can detect is 250 copies / mL. A negative result does not preclude SARS-CoV-2 infection and should not be used as the sole basis for treatment or other patient management decisions.  A negative result may occur with improper specimen collection / handling, submission of specimen other than nasopharyngeal swab, presence of viral mutation(s) within the areas targeted by this assay, and inadequate number of viral copies (<250 copies / mL). A negative result must be combined with clinical observations, patient history, and epidemiological information. Fact Sheet for Patients:   StrictlyIdeas.no Fact Sheet for Healthcare Providers: BankingDealers.co.za This test is not yet approved or cleared  by the [place] FDA and has been authorized for detection and/or diagnosis of SARS-CoV-2 by FDA under an Emergency Use Authorization (EUA).  This EUA will remain in effect (meaning this test can be used) for the duration of the COVID-19 declaration under Section 564(b)(1) of the Act, 21 U.S.C. section 360bbb-3(b)(1), unless the authorization is terminated  or revoked sooner. Performed at [facility], [address] [place], [place] [address]   MRSA PCR Screening     Status: None   Collection Time: 07/14/19  3:15 PM   Specimen: Nasopharyngeal  Result Value Ref Range Status   MRSA by PCR NEGATIVE NEGATIVE Final    Comment:        The GeneXpert MRSA Assay (FDA approved for NASAL specimens only), is one component of a comprehensive MRSA colonization surveillance program. It is not intended to diagnose MRSA infection nor to guide or monitor treatment for MRSA infections. Performed at [facility], [address] [place], [place] [address]   Expectorated sputum assessment w rflx to resp cult     Status: None   Collection Time: 07/16/19  5:48 AM   Specimen: Expectorated Sputum  Result Value Ref Range Status   Specimen Description EXPECTORATED SPUTUM  Final   Special Requests NONE  Final   Sputum evaluation   Final    THIS SPECIMEN IS ACCEPTABLE FOR SPUTUM CULTURE Performed at [facility], [address] [place], [place] [address]    Report Status 07/16/2019 FINAL  Final  Culture, respiratory     Status: None (Preliminary result)   Collection Time: 07/16/19  5:48 AM  Result Value Ref Range Status   Specimen Description   Final    EXPECTORATED SPUTUM Performed at [facility], [address] [place], [place] [address]    Special Requests   Final    NONE Reflexed from [id number] Performed at [facility], [address] [place], [place] [address]    Gram Stain PENDING  Incomplete   Culture   Final    CULTURE REINCUBATED FOR BETTER GROWTH Performed at [facility], [place] [address] [place], [place] [address]    Report Status PENDING  Incomplete    Radiology Reports US Venous Img Lower Bilateral (DVT)  Result Date: 07/06/2019 CLINICAL DATA:  Bilateral lower extremity edema. EXAM: BILATERAL LOWER EXTREMITY VENOUS DOPPLER ULTRASOUND TECHNIQUE: Gray-scale sonography with graded compression, as well as color Doppler and duplex  ultrasound were performed to evaluate the lower extremity deep venous systems from the level of the common femoral vein and including the common femoral, femoral, profunda femoral, popliteal and calf veins including the posterior tibial, peroneal and gastrocnemius veins when visible. The superficial great saphenous vein was also interrogated. Spectral Doppler was utilized to evaluate flow at rest and  with distal augmentation maneuvers in the common femoral, femoral and popliteal veins. COMPARISON:  None. FINDINGS: RIGHT LOWER EXTREMITY Common Femoral Vein: No evidence of thrombus. Normal compressibility, respiratory phasicity and response to augmentation. Saphenofemoral Junction: No evidence of thrombus. Normal compressibility and flow on color Doppler imaging. Profunda Femoral Vein: No evidence of thrombus. Normal compressibility and flow on color Doppler imaging. Femoral Vein: No evidence of thrombus. Normal compressibility, respiratory phasicity and response to augmentation. Popliteal Vein: No evidence of thrombus. Normal compressibility, respiratory phasicity and response to augmentation. Calf Veins: No evidence of thrombus. Normal compressibility and flow on color Doppler imaging. Superficial Great Saphenous Vein: No evidence of thrombus. Normal compressibility. Venous Reflux:  None. Other Findings:  None. LEFT LOWER EXTREMITY Common Femoral Vein: No evidence of thrombus. Normal compressibility, respiratory phasicity and response to augmentation. Saphenofemoral Junction: No evidence of thrombus. Normal compressibility and flow on color Doppler imaging. Profunda Femoral Vein: No evidence of thrombus. Normal compressibility and flow on color Doppler imaging. Femoral Vein: No evidence of thrombus. Normal compressibility, respiratory phasicity and response to augmentation. Popliteal Vein: No evidence of thrombus. Normal compressibility, respiratory phasicity and response to augmentation. Calf Veins: No evidence of  thrombus. Normal compressibility and flow on color Doppler imaging. Superficial Great Saphenous Vein: No evidence of thrombus. Normal compressibility. Venous Reflux:  None. Other Findings: No evidence of superficial thrombophlebitis or abnormal fluid collection. IMPRESSION: No evidence of deep venous thrombosis in either lower extremity. Electronically Signed   By: Aletta Edouard M.D.   On: 07/06/2019 10:15   DG Chest Port 1 View  Result Date: 07/14/2019 CLINICAL DATA:  Shortness of breath EXAM: PORTABLE CHEST 1 VIEW COMPARISON:  July 01, 2019. chest radiograph and chest CT June 15, 2019 FINDINGS: There is bullous disease in the upper lobes, more severe on the right with postoperative changes and scarring in lung apices. There is an area of focal scarring in the lateral left mid lung. There is no edema or airspace opacity. The heart size is normal. The pulmonary vascularity reflects a underlying emphysematous change and is stable. No adenopathy. No bone lesions IMPRESSION: Extensive underlying emphysematous change with areas of bullous disease and scarring. No edema or airspace disease. Stable cardiac silhouette. No evident adenopathy. Emphysema (ICD10-J43.9). Electronically Signed   By: Lowella Grip III M.D.   On: 07/14/2019 14:04   DG Chest Portable 1 View  Result Date: 07/01/2019 CLINICAL DATA:  Shortness of breath. Hypoxia. EXAM: PORTABLE CHEST 1 VIEW COMPARISON:  CT chest 06/15/2019. One-view chest x-ray 06/13/2019. FINDINGS: Heart size is normal. Aortic atherosclerosis is again seen. Changes of severe COPD are evident. Scarring the left lung is stable. No acute superimposed disease is present. IMPRESSION: 1. Severe COPD. 2. No acute cardiopulmonary disease or significant interval change. Electronically Signed   By: San Morelle M.D.   On: 07/01/2019 11:44     CBC Recent Labs  Lab 07/14/19 1350 07/15/19 0548  WBC 16.1* 17.9*  HGB 11.6* 11.0*  HCT 38.8* 36.7*  PLT 382 326  MCV  101.8* 103.1*  MCH 30.4 30.9  MCHC 29.9* 30.0  RDW 13.6 13.2    Chemistries  Recent Labs  Lab 07/14/19 1350 07/16/19 0438  NA 140  --   K 3.7  --   CL 91*  --   CO2 39*  --   GLUCOSE 117*  --   BUN 18  --   CREATININE 0.81 0.63  CALCIUM 9.1  --   AST 16  --  ALT 29  --   ALKPHOS 63  --   BILITOT 0.4  --    ------------------------------------------------------------------------------------------------------------------ No results for input(s): CHOL, HDL, LDLCALC, TRIG, CHOLHDL, LDLDIRECT in the last 72 hours.  Lab Results  Component Value Date   HGBA1C 5.6 06/15/2019   ------------------------------------------------------------------------------------------------------------------ No results for input(s): TSH, T4TOTAL, T3FREE, THYROIDAB in the last 72 hours.  Invalid input(s): FREET3 ------------------------------------------------------------------------------------------------------------------ No results for input(s): VITAMINB12, FOLATE, FERRITIN, TIBC, IRON, RETICCTPCT in the last 72 hours.  Coagulation profile No results for input(s): INR, PROTIME in the last 168 hours.  No results for input(s): DDIMER in the last 72 hours.  Cardiac Enzymes No results for input(s): CKMB, TROPONINI, MYOGLOBIN in the last 168 hours.  Invalid input(s): CK ------------------------------------------------------------------------------------------------------------------    Component Value Date/Time   BNP 38.0 07/14/2019 1356     Rajat Staver M.D on 07/17/2019 at 11:49 AM  Go to [email or web address] - for contact info  [facility] - Office  [phone]

## 2019-07-17 NOTE — Progress Notes (Signed)
NAME:  Wesley [name], MRN:  [id number], DOB:  [birth year], LOS: 3 ADMISSION DATE:  07/14/2019, CONSULTATION DATE:  07/14/2019 REFERRING MD:  Dr. Denton Brick, [facility], CHIEF COMPLAINT:  Short of breath   Brief History   54 yo male former smoker with recurrent COPD exacerbation with Pseudomonal tracheobronchitis.  Past Medical History  Osteoporosis, Neuropathy, Chronic back pain, s/p Rt VATS bullectomy 01/16/13, s/p Lt VATS bullectomy 08/28/13, Asbestos exposure, Lt lung hernia, Chronic pain  Significant Hospital Events   5/10 Admit 5/11 episode of confusion 5/12 change back to home pain regimen  Consults:    Procedures:    Significant Diagnostic Tests:    Micro Data:  Sputum 4/29 >> Pseudomonas aeruginosa SARS CoV2 PCR 5/10 >> negative  Antimicrobials:  Vancomycin 5/10 >> 5/11 Cefepime 5/10 >>   Interim history/subjective:  Pain is better controlled.  Breathing better.  Getting more sleep.  Has raspy throat.  Objective   Blood pressure 125/65, pulse 94, temperature 98.1 F (36.7 C), temperature source Oral, resp. rate 20, height 6\' 1"  (1.854 m), weight 86.3 kg, SpO2 98 %.        Intake/Output Summary (Last 24 hours) at 07/17/2019 1454 Last data filed at 07/17/2019 1228 Gross per 24 hour  Intake 1723.95 ml  Output 1350 ml  Net 373.95 ml   Filed Weights   07/14/19 1337 07/14/19 2011  Weight: 86.2 kg 86.3 kg    Examination:  General - alert Eyes - pupils reactive ENT - no sinus tenderness, no stridor, raspy voice Cardiac - regular rate/rhythm, no murmur Chest - decreased breath sounds b/l, no wheezing or rales Abdomen - soft, non tender, + bowel sounds Extremities - no cyanosis, clubbing, or edema Skin - no rashes Neuro - normal strength, moves extremities, follows commands Psych - normal mood and behavior  Resolved Hospital Problem list     Assessment & Plan:   Acute on chronic hypoxic/hypercapnic respiratory failure from Pseudomonal tracheobronchitis  causing COPD exacerbation. - day 4 of ABx - likely can change to prednisone on 5/14 - continue pulmicort and q4h duoneb - prn albuterol - continue singulair - goal SpO2 90 to 95%  D/w Dr. Denton Brick  Best practice:  Diet: Regular diet DVT prophylaxis: lovenox GI prophylaxis: not indicated Mobility: As tolerated Code Status: Full code Disposition: telemetry; if he continues to improve then might be ready for hospital d/c in next 24 to 48 hrs  Labs:   CMP Latest Ref Rng & Units 07/16/2019 07/14/2019 07/02/2019  Glucose 70 - 99 mg/dL - 117(H) 271(H)  BUN 6 - 20 mg/dL - 18 17  Creatinine 0.61 - 1.24 mg/dL 0.63 0.81 0.83  Sodium 135 - 145 mmol/L - 140 136  Potassium 3.5 - 5.1 mmol/L - 3.7 4.2  Chloride 98 - 111 mmol/L - 91(L) 97(L)  CO2 22 - 32 mmol/L - 39(H) 27  Calcium 8.9 - 10.3 mg/dL - 9.1 9.0  Total Protein 6.5 - 8.1 g/dL - 6.4(L) -  Total Bilirubin 0.3 - 1.2 mg/dL - 0.4 -  Alkaline Phos 38 - 126 U/L - 63 -  AST 15 - 41 U/L - 16 -  ALT 0 - 44 U/L - 29 -    CBC Latest Ref Rng & Units 07/15/2019 07/14/2019 07/02/2019  WBC 4.0 - 10.5 K/uL 17.9(H) 16.1(H) 11.8(H)  Hemoglobin 13.0 - 17.0 g/dL 11.0(L) 11.6(L) 12.2(L)  Hematocrit 39.0 - 52.0 % 36.7(L) 38.8(L) 40.5  Platelets 150 - 400 K/uL 326 382 242    ABG  Component Value Date/Time   PHART 7.329 (L) 07/15/2019 0948   PCO2ART 68.8 (HH) 07/15/2019 0948   PO2ART 82.2 (L) 07/15/2019 0948   HCO3 31.4 (H) 07/15/2019 0948   TCO2 21.5 02/24/2014 1558   O2SAT 95.7 07/15/2019 0948    Signature:  Chesley Mires, MD [place] Pager - [phone] 07/17/2019, 2:53 PM

## 2019-07-17 NOTE — Progress Notes (Signed)
Patient alert and oriented x 4.  Reviewed all medications with patient and times when they are due.  Patient refused bed alarm at this time.

## 2019-07-17 NOTE — Plan of Care (Signed)

## 2019-07-18 LAB — BASIC METABOLIC PANEL
Anion gap: 9 (ref 5–15)
BUN: 27 mg/dL — ABNORMAL HIGH (ref 6–20)
CO2: 34 mmol/L — ABNORMAL HIGH (ref 22–32)
Calcium: 8.8 mg/dL — ABNORMAL LOW (ref 8.9–10.3)
Chloride: 99 mmol/L (ref 98–111)
Creatinine, Ser: 0.56 mg/dL — ABNORMAL LOW (ref 0.61–1.24)
GFR calc Af Amer: >60 mL/min (ref >60)
GFR calc non Af Amer: >60 mL/min (ref >60)
Glucose, Bld: 158 mg/dL — ABNORMAL HIGH (ref 70–99)
Potassium: 4.5 mmol/L (ref 3.5–5.1)
Sodium: 142 mmol/L (ref 135–145)

## 2019-07-18 LAB — CBC
HCT: 37.8 % — ABNORMAL LOW (ref 39.0–52.0)
Hemoglobin: 11.1 g/dL — ABNORMAL LOW (ref 13.0–17.0)
MCH: 30.3 pg (ref 26.0–34.0)
MCHC: 29.4 g/dL — ABNORMAL LOW (ref 30.0–36.0)
MCV: 103.3 fL — ABNORMAL HIGH (ref 80.0–100.0)
Platelets: 379 10*3/uL (ref 150–400)
RBC: 3.66 MIL/uL — ABNORMAL LOW (ref 4.22–5.81)
RDW: 13.6 % (ref 11.5–15.5)
WBC: 21.1 10*3/uL — ABNORMAL HIGH (ref 4.0–10.5)
nRBC: 0 % (ref 0.0–0.2)

## 2019-07-18 LAB — GLUCOSE, CAPILLARY
Glucose-Capillary: 170 mg/dL — ABNORMAL HIGH (ref 70–99)
Glucose-Capillary: 224 mg/dL — ABNORMAL HIGH (ref 70–99)

## 2019-07-18 LAB — CULTURE, RESPIRATORY W GRAM STAIN: Culture: NORMAL

## 2019-07-18 MED ORDER — PREDNISONE 20 MG PO TABS: 20.0000 mg | ORAL_TABLET | ORAL | 0 refills | Status: DC

## 2019-07-18 MED ORDER — SODIUM CHLORIDE 3 % IN NEBU: 4.0000 mL | INHALATION_SOLUTION | Freq: Two times a day (BID) | RESPIRATORY_TRACT | Status: DC

## 2019-07-18 MED ORDER — LEVOFLOXACIN 750 MG PO TABS
750.0000 mg | ORAL_TABLET | Freq: Every day | ORAL | 0 refills | Status: AC
Start: 2019-07-18 — End: 2019-07-23

## 2019-07-18 MED ORDER — INCRUSE ELLIPTA 62.5 MCG/INH IN AEPB: 1.0000 | INHALATION_SPRAY | Freq: Every day | RESPIRATORY_TRACT | 2 refills | Status: DC

## 2019-07-18 MED ORDER — NICOTINE 14 MG/24HR TD PT24: 14.0000 mg | MEDICATED_PATCH | Freq: Every day | TRANSDERMAL | 0 refills | Status: DC

## 2019-07-18 MED ORDER — GUAIFENESIN ER 600 MG PO TB12: 600.0000 mg | ORAL_TABLET | Freq: Two times a day (BID) | ORAL | 0 refills | Status: DC

## 2019-07-18 MED ORDER — ALBUTEROL SULFATE (2.5 MG/3ML) 0.083% IN NEBU: 2.5000 mg | INHALATION_SOLUTION | Freq: Four times a day (QID) | RESPIRATORY_TRACT | 12 refills | Status: DC | PRN

## 2019-07-18 MED ORDER — ASPIRIN 81 MG PO CHEW: 81.0000 mg | CHEWABLE_TABLET | Freq: Every day | ORAL | 2 refills | Status: DC

## 2019-07-18 MED ORDER — ALBUTEROL SULFATE HFA 108 (90 BASE) MCG/ACT IN AERS: 2.0000 | INHALATION_SPRAY | RESPIRATORY_TRACT | 1 refills | Status: DC | PRN

## 2019-07-18 NOTE — Plan of Care (Signed)
  Problem: Education: Goal: Knowledge of General Education information will improve Description: Including pain rating scale, medication(s)/side effects and non-pharmacologic comfort measures Outcome: Adequate for Discharge   Problem: Health Behavior/Discharge Planning: Goal: Ability to manage health-related needs will improve Outcome: Adequate for Discharge   Problem: Pain Managment: Goal: General experience of comfort will improve Outcome: Adequate for Discharge   Problem: Safety: Goal: Ability to remain free from injury will improve Outcome: Adequate for Discharge   Problem: Skin Integrity: Goal: Risk for impaired skin integrity will decrease Outcome: Adequate for Discharge

## 2019-07-18 NOTE — Care Management Important Message (Signed)
Important Message  Patient Details  Name: Wesley [name] MRN: [id number] Date of Birth: 11/17/1965   Medicare Important Message Given:  Yes     Tommy Medal 07/18/2019, 3:35 PM

## 2019-07-18 NOTE — TOC Transition Note (Signed)
Transition of Care [facility]) - CM/SW Discharge Note   Patient Details  Name: Wesley [name] MRN: [id number] Date of Birth: 08/03/1965  Transition of [facility]) CM/SW Contact:  Shade Flood, LCSW Phone Number: 07/18/2019, 2:43 PM   Clinical Narrative:     Pt stable for dc home today per MD. Pt needs [facility] RN and PT. Arranged with [facility] at pt request. Also requested portable O2 tank for dc from [place] at [facility] Tank will be delivered to pt's room. There are no other TOC needs for dc.  Final next level of care: [place] Barriers to Discharge: Barriers Resolved   Patient Goals and CMS Choice Patient states their goals for this hospitalization and ongoing recovery are:: Return home CMS Medicare.gov Compare Post Acute Care list provided to:: Patient Choice offered to / list presented to : Patient  Discharge Placement                       Discharge Plan and Services                  DME Agency: [employer](Provides home O2)       HH Arranged: RN, PT [place] Agency: [place] ([place]) Date [place]: 07/18/19   Representative spoke with at [place]: [place] ([place]) Interventions     Readmission Risk Interventions Readmission Risk Prevention Plan 07/03/2019  Transportation Screening Complete  PCP or Specialist Appt within 3-5 Days Complete  Social Work Consult for [place] Planning/Counseling [place] Not Applicable  Medication Review Press photographer) Complete  Some recent data might be hidden

## 2019-07-18 NOTE — Discharge Summary (Signed)
Wesley [name], is a 54 y.o. male  DOB May 07, 1965  MRN [id number].  Admission date:  07/14/2019  Admitting Physician  Roney Jaffe, MD  Discharge Date:  07/18/2019   Primary MD  [name], MD  Recommendations for primary care physician for things to follow:   1)Very low-salt diet advised 2)Weigh yourself daily, call if you gain more than 3 pounds in 1 day or more than 5 pounds in 1 week as your diuretic medications may need to be adjusted 3)Limit your Fluid  intake to no more than 60 ounces (1.8 Liters) per day 4)you need oxygen at home at 3 to 4 L via nasal cannula continuously while awake and while asleep--- smoking or having open fires around oxygen can cause fire, significant injury and death 5) please use nicotine patch to help you quit smoking 6) follow-up with pulmonologist Dr. Elsworth Soho over the next week or 2 as advised 7) follow-up with your cardiologist at [facility] cardiology in [place] 8) follow-up with your primary care team at [facility] for refills of your pain medications 9)Avoid ibuprofen/Advil/Aleve/Motrin/Goody Powders/Naproxen/BC powders/Meloxicam/Diclofenac/Indomethacin and other Nonsteroidal anti-inflammatory medications as these will make you more likely to bleed and can cause stomach ulcers, can also cause Kidney problems. 10)Taper off your prednisone very slowly as advised  Admission Diagnosis  Productive cough [R05] COPD exacerbation (HCC) [J44.1] COPD with exacerbation ([place]) [J44.1] Acute on chronic respiratory failure with hypoxia ([place]) [J96.21]   Discharge Diagnosis  Productive cough [R05] COPD exacerbation ([place]) [J44.1] COPD with exacerbation ([place]) [J44.1] Acute on chronic respiratory failure with hypoxia (HCC) [J96.21]    Active Problems:   Opiate dependence (HCC)   Chronic respiratory failure (HCC)   SOB (shortness of breath)   Acute exacerbation of chronic obstructive  pulmonary disease (COPD) (HCC)   COPD with exacerbation (HCC)      Past Medical History:  Diagnosis Date  . Asbestos exposure   . Avascular necrosis of femur ([place])   . Back pain   . Bullous emphysema ([place])   . COPD (chronic obstructive pulmonary disease) ([place])   . Neuropathy   . On home oxygen therapy    "3L prn" (07/19/2016)  . Osteoporosis   . 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 BACK SURGERY    . EYE SURGERY     left eye cornea and lens replaced- blind in left eye  . JOINT REPLACEMENT    . LEFT HEART CATH AND CORONARY ANGIOGRAPHY N/A 07/20/2016   Procedure: Left Heart Cath and Coronary Angiography;  Surgeon: Lorretta Harp, MD;  Location: [place] [facility];  Service: Cardiovascular;  Laterality: N/A;  . LUNG SURGERY    . TOTAL HIP ARTHROPLASTY Right 09/24/2015   Procedure: TOTAL HIP ARTHROPLASTY ANTERIOR APPROACH;  Surgeon: Gaynelle Arabian, MD;  Location: [facility];  Service: Orthopedics;  Laterality: Right;     HPI  from the history and physical  done on the day of admission:   Chief Complaint: Dr Ashok Cordia, ED HPI: The patient is a 54 y.o. year-old w/ hx of severe COPD, home O2, neuropathy, chronic back pain/ hx back surgery, L THA, asbestos exposure who presents today sent from lung MD office appt for admission due to acute bronchitis w/ COPD exacerbation.   Per Dr [name] note from today's visit, pt was in hospital in 4/21 w/ COPD exac w/ sputum cx that grew Pseudomonas pan sensitive and was dc'd on pred and levaquin. Took his last doses yesterday and may have been sent home on doxycycline not levaquin. Today breathing is worse again, coughing yellow sputum w/ some blood streaking, wheezing more and legs more swollen. Taking 40 bid lasix since hosp dc.  Pulm recommends IV abx w/ Pseudomonas coverage, repeat IV solumedrol and give pulmicort and duoneb in hospital, cont po lasix 40 bid  for now.   Pt seen in ED, states that he has severe COPD but he will do well for 2-3 yrs then be in hospital for 2- 3 mos. He also states he has chronic pain . Sees [facility]. Also sees [facility] monthly and has gotten down from 13m tid > 624mtid  > 30 mg long-acting (MS Contin) tid now, and short-acting Oxy IR was taking 16 pills per day (3078mand is now down to 6 pills (5m71mer day, this is per the patient.   Pt lives w/ his wife , no tobacco or etoh, smoke e-cigs (no juice types) and uses nicotine patch.   Denies any sig chest pain. Constipated and trying miralax at home but was told needs to drink w/ more liquid.  No abd pain or n/v/d.  No fevers.   [place] asbestos taking apart radiatiors. Had bilat lung surgery (? Bleb resection) which improved his breathing.     Hospital Course:    Brief Summary:- 54 y23male former smoker with recurrent COPD exacerbation with Pseudomonal tracheobronchitis and chronic opiate use with hypoxic and hypercapnic respiratory failure readmitted on 07/14/2019 with worsening hypoxia and worsening hypercapnia  A/p 1)Acute on chronic hypoxic and hypercapnic respiratory failure--- ABG noted -Overall hypoxia and hypercapnia has improved, clinically patient much improved -Pulmonology consult appreciated discussed with Dr. SoodHalford Chessmanated with IV  cefepime   for Pseudomonas day  tracheobronchitis -discharge on p.o. Levaquin Treated with IV Solu-Medrol bronchodilators and mucolytics as ordered -We will discharge on p.o. prednisone with slow taper -Follow-up with pulmonologist Dr. AlvaElsworth Sohoadvised  2) chronic pain syndrome/chronic back pain----opiate regimen adjusted due to excessive sleepiness and hypercapnia -Patient is argumentative and unhappy about any adjustment to his opiate regimen, --Dr. SoodHalford Chessmannged patient pain medication back to the way patient wanted it to  be  3)Social/ethics--patient is a full code  4) chronic lower extremity edema presumably from venous stasis--- patient declined TED stockings, I applied some Ace wraps  5)Hyperglycemia--steroid induced, --should improve with prednisone taper   Disposition--- discharge home-with home oxygen and home health orders Code Status : full  Family Communication:   NA (patient is alert, awake and coherent)  Consults  :  pulm Discharge Condition: Oxygen requirement is back to preadmission levels  Follow UP  [facility].   [facility] [place] to arrange in home visits          Diet and Activity  recommendation:  As advised  Discharge Instructions    Discharge Instructions    Call MD for:  difficulty breathing, headache or visual disturbances   Complete by: As directed    Call MD for:  extreme fatigue   Complete by: As directed    Call MD for:  persistant dizziness or light-headedness   Complete by: As directed    Call MD for:  persistant nausea and vomiting   Complete by: As directed    Call MD for:  severe uncontrolled pain   Complete by: As directed    Call MD for:  temperature >100.4   Complete by: As directed    Diet - low sodium heart healthy   Complete by: As directed    Discharge instructions   Complete by: As directed    1)Very low-salt diet advised 2)Weigh yourself daily, call if you gain more than 3 pounds in 1 day or more than 5 pounds in 1 week as your diuretic medications may need to be adjusted 3)Limit your Fluid  intake to no more than 60 ounces (1.8 Liters) per day 4)you need oxygen at home at 3 to 4 L via nasal cannula continuously while awake and while asleep--- smoking or having open fires around oxygen can cause fire, significant injury and death 5) please use nicotine patch to help you quit smoking 6) follow-up with pulmonologist Dr. Elsworth Soho over the  next week or 2 as advised 7) follow-up with your cardiologist at [facility] cardiology in [place] 8) follow-up with your primary care team at [facility] for refills of your pain medications 9)Avoid ibuprofen/Advil/Aleve/Motrin/Goody Powders/Naproxen/BC powders/Meloxicam/Diclofenac/Indomethacin and other Nonsteroidal anti-inflammatory medications as these will make you more likely to bleed and can cause stomach ulcers, can also cause Kidney problems. 10)Taper off your prednisone very slowly as advised   Increase activity slowly   Complete by: As directed        Discharge Medications     Allergies as of 07/18/2019      Reactions   Ambien [zolpidem Tartrate] Other (See Comments)   hallucinations   Melatonin Itching   Zolpidem Other (See Comments)   Altered mental status      Medication List    STOP taking these medications   HYDROcodone-homatropine 5-1.5 MG/5ML syrup Commonly known as: HYCODAN     TAKE these medications   acetaminophen 325 MG tablet Commonly known as: TYLENOL Take 2 tablets by mouth every 4 (four) hours as needed.   Advair Diskus 250-50 MCG/DOSE Aepb Generic drug: Fluticasone-Salmeterol Inhale 1 puff into the lungs 2 (two) times daily.   albuterol (2.5 MG/3ML) 0.083% nebulizer solution Commonly known as: PROVENTIL Take 3 mLs (2.5 mg total) by nebulization every 6 (six) hours as needed for wheezing or shortness of breath. What changed:   when to take this  reasons to take this   albuterol 108 (90 Base) MCG/ACT inhaler Commonly known as: VENTOLIN HFA Inhale 2 puffs into the lungs every 4 (four) hours as needed for wheezing or shortness of breath. Shortness of breath What changed: Another medication with the same name was changed. Make sure you understand how and when to take each.   aspirin 81 MG chewable tablet Chew 1 tablet (81 mg total) by mouth daily with breakfast. What changed: when to take this   furosemide 40 MG tablet Commonly known as:  LASIX Take 1 tablet (40 mg total) by mouth daily.   guaiFENesin 600 MG 12 hr tablet Commonly known as: MUCINEX Take 1 tablet (600  mg total) by mouth 2 (two) times daily.   guaiFENesin-dextromethorphan 100-10 MG/5ML syrup Commonly known as: ROBITUSSIN DM Take 5 mLs by mouth every 4 (four) hours as needed for cough.   Incruse Ellipta 62.5 MCG/INH Aepb Generic drug: umeclidinium bromide Inhale 1 puff into the lungs daily.   levofloxacin 750 MG tablet Commonly known as: Levaquin Take 1 tablet (750 mg total) by mouth daily for 5 days.   montelukast 10 MG tablet Commonly known as: SINGULAIR Take 1 tablet by mouth daily.   morphine 30 MG 12 hr tablet Commonly known as: MS CONTIN Take 30 mg by mouth every 8 (eight) hours.   mupirocin ointment 2 % Commonly known as: BACTROBAN Place 1 application into the nose every other day.   Narcan 4 MG/0.1ML Liqd nasal spray kit Generic drug: naloxone Place 4 mg into the nose as directed. To prevent overdose   nicotine 14 mg/24hr patch Commonly known as: NICODERM CQ - dosed in mg/24 hours Place 1 patch (14 mg total) onto the skin daily. Start taking on: Jul 19, 2019   ondansetron 4 MG disintegrating tablet Commonly known as: ZOFRAN-ODT Take 1 tablet by mouth every 8 (eight) hours as needed for nausea or vomiting.   oxycodone 30 MG immediate release tablet Commonly known as: ROXICODONE Take 30 mg by mouth every 4 (four) hours.   OXYGEN Inhale 4-5 L into the lungs continuous.   polyethylene glycol powder 17 GM/SCOOP powder Commonly known as: GLYCOLAX/MIRALAX Take 17 g by mouth daily.   predniSONE 20 MG tablet Commonly known as: Deltasone Take 1 tablet (20 mg total) by mouth See admin instructions. Take 3 tablets (70m) daily for 5 days, then 2 tablets (40 mg) daily for 5 days and then 1 Tab (20 mg) daily for 5 days--take with food   pregabalin 150 MG capsule Commonly known as: LYRICA Take 150 mg by mouth 3 (three) times daily.    Systane 0.4-0.3 % Soln Generic drug: Polyethyl Glycol-Propyl Glycol Place 1-2 drops into the left eye daily as needed (for dry eye relief).       Major procedures and Radiology Reports - PLEASE review detailed and final reports for all details, in brief -   [place]ous Img Lower Bilateral (DVT)  Result Date: 07/06/2019 CLINICAL DATA:  Bilateral lower extremity edema. EXAM: BILATERAL LOWER EXTREMITY VENOUS DOPPLER ULTRASOUND TECHNIQUE: Gray-scale sonography with graded compression, as well as color Doppler and duplex ultrasound were performed to evaluate the lower extremity deep venous systems from the level of the common femoral vein and including the common femoral, femoral, profunda femoral, popliteal and calf veins including the posterior tibial, peroneal and gastrocnemius veins when visible. The superficial great saphenous vein was also interrogated. Spectral Doppler was utilized to evaluate flow at rest and with distal augmentation maneuvers in the common femoral, femoral and popliteal veins. COMPARISON:  None. FINDINGS: RIGHT LOWER EXTREMITY Common Femoral Vein: No evidence of thrombus. Normal compressibility, respiratory phasicity and response to augmentation. Saphenofemoral Junction: No evidence of thrombus. Normal compressibility and flow on color Doppler imaging. Profunda Femoral Vein: No evidence of thrombus. Normal compressibility and flow on color Doppler imaging. Femoral Vein: No evidence of thrombus. Normal compressibility, respiratory phasicity and response to augmentation. Popliteal Vein: No evidence of thrombus. Normal compressibility, respiratory phasicity and response to augmentation. Calf Veins: No evidence of thrombus. Normal compressibility and flow on color Doppler imaging. Superficial Great Saphenous Vein: No evidence of thrombus. Normal compressibility. Venous Reflux:  None. Other Findings:  None. LEFT  LOWER EXTREMITY Common Femoral Vein: No evidence of thrombus. Normal  compressibility, respiratory phasicity and response to augmentation. Saphenofemoral Junction: No evidence of thrombus. Normal compressibility and flow on color Doppler imaging. Profunda Femoral Vein: No evidence of thrombus. Normal compressibility and flow on color Doppler imaging. Femoral Vein: No evidence of thrombus. Normal compressibility, respiratory phasicity and response to augmentation. Popliteal Vein: No evidence of thrombus. Normal compressibility, respiratory phasicity and response to augmentation. Calf Veins: No evidence of thrombus. Normal compressibility and flow on color Doppler imaging. Superficial Great Saphenous Vein: No evidence of thrombus. Normal compressibility. Venous Reflux:  None. Other Findings: No evidence of superficial thrombophlebitis or abnormal fluid collection. IMPRESSION: No evidence of deep venous thrombosis in either lower extremity. Electronically Signed   By: Aletta Edouard M.D.   On: 07/06/2019 10:15   DG Chest Port 1 View  Result Date: 07/14/2019 CLINICAL DATA:  Shortness of breath EXAM: PORTABLE CHEST 1 VIEW COMPARISON:  July 01, 2019. chest radiograph and chest CT June 15, 2019 FINDINGS: There is bullous disease in the upper lobes, more severe on the right with postoperative changes and scarring in lung apices. There is an area of focal scarring in the lateral left mid lung. There is no edema or airspace opacity. The heart size is normal. The pulmonary vascularity reflects a underlying emphysematous change and is stable. No adenopathy. No bone lesions IMPRESSION: Extensive underlying emphysematous change with areas of bullous disease and scarring. No edema or airspace disease. Stable cardiac silhouette. No evident adenopathy. Emphysema (ICD10-J43.9). Electronically Signed   By: Lowella Grip III M.D.   On: 07/14/2019 14:04   DG Chest Portable 1 View  Result Date: 07/01/2019 CLINICAL DATA:  Shortness of breath. Hypoxia. EXAM: PORTABLE CHEST 1 VIEW COMPARISON:   CT chest 06/15/2019. One-view chest x-ray 06/13/2019. FINDINGS: Heart size is normal. Aortic atherosclerosis is again seen. Changes of severe COPD are evident. Scarring the left lung is stable. No acute superimposed disease is present. IMPRESSION: 1. Severe COPD. 2. No acute cardiopulmonary disease or significant interval change. Electronically Signed   By: San Morelle M.D.   On: 07/01/2019 11:44    Micro Results   Recent Results (from the past 240 hour(s))  SARS Coronavirus 2 by RT PCR (hospital order, performed in [facility] hospital lab) Nasopharyngeal Nasopharyngeal Swab     Status: None   Collection Time: 07/14/19  3:01 PM   Specimen: Nasopharyngeal Swab  Result Value Ref Range Status   SARS Coronavirus 2 NEGATIVE NEGATIVE Final    Comment: (NOTE) SARS-CoV-2 target nucleic acids are NOT DETECTED. The SARS-CoV-2 RNA is generally detectable in upper and lower respiratory specimens during the acute phase of infection. The lowest concentration of SARS-CoV-2 viral copies this assay can detect is 250 copies / mL. A negative result does not preclude SARS-CoV-2 infection and should not be used as the sole basis for treatment or other patient management decisions.  A negative result may occur with improper specimen collection / handling, submission of specimen other than nasopharyngeal swab, presence of viral mutation(s) within the areas targeted by this assay, and inadequate number of viral copies (<250 copies / mL). A negative result must be combined with clinical observations, patient history, and epidemiological information. Fact Sheet for Patients:   StrictlyIdeas.no Fact Sheet for Healthcare Providers: BankingDealers.co.za This test is not yet approved or cleared  by the [place] FDA and has been authorized for detection and/or diagnosis of SARS-CoV-2 by FDA under an Emergency Use Authorization (EUA).  This EUA will  remain in effect (meaning this test can be used) for the duration of the COVID-19 declaration under Section 564(b)(1) of the Act, 21 U.S.C. section 360bbb-3(b)(1), unless the authorization is terminated or revoked sooner. Performed at [facility], [address], [place], [place] [address]   MRSA PCR Screening     Status: None   Collection Time: 07/14/19  3:15 PM   Specimen: Nasopharyngeal  Result Value Ref Range Status   MRSA by PCR NEGATIVE NEGATIVE Final    Comment:        The GeneXpert MRSA Assay (FDA approved for NASAL specimens only), is one component of a comprehensive MRSA colonization surveillance program. It is not intended to diagnose MRSA infection nor to guide or monitor treatment for MRSA infections. Performed at [facility], [address], [place], [place] [address]   Expectorated sputum assessment w rflx to resp cult     Status: None   Collection Time: 07/16/19  5:48 AM   Specimen: Expectorated Sputum  Result Value Ref Range Status   Specimen Description EXPECTORATED SPUTUM  Final   Special Requests NONE  Final   Sputum evaluation   Final    THIS SPECIMEN IS ACCEPTABLE FOR SPUTUM CULTURE Performed at [facility], [address], [place], [place] [address]    Report Status 07/16/2019 FINAL  Final  Culture, respiratory     Status: None   Collection Time: 07/16/19  5:48 AM  Result Value Ref Range Status   Specimen Description   Final    EXPECTORATED SPUTUM Performed at [facility], [address] [place], [place] [address]    Special Requests   Final    NONE Reflexed from [id number] Performed at [facility], [address], [place], [place] [address]    Gram Stain   Final    RARE WBC PRESENT, PREDOMINANTLY PMN RARE GRAM POSITIVE COCCI IN CLUSTERS    Culture   Final    Consistent with normal respiratory flora. Performed at [facility], [place] [address] [place], [place] [address]    Report Status 07/18/2019 FINAL  Final       Today    Subjective    Wesley [name] today has no new complaints  --- He has been chatting for over half an hour with his friend who is visiting      -No conversational dyspnea -Ambulated around the room says he feels a lot better -Oxygen saturation and oxygen requirement back to baseline -- Patient states he feels a lot better now than he did this morning when he saw Dr. Halford Chessman  -He would like to go home with his friend who will be visiting him   Patient has been seen and examined prior to discharge   Objective   Blood pressure 129/78, pulse (!) 127, temperature 98.4 F (36.9 C), temperature source Oral, resp. rate 20, height 6' 1"  (1.854 m), weight 86.3 kg, SpO2 98 %.   Intake/Output Summary (Last 24 hours) at 07/18/2019 1459 Last data filed at 07/18/2019 1100 Gross per 24 hour  Intake 951.63 ml  Output 1100 ml  Net -148.37 ml    Exam Gen:-In no acute distress, no conversational dyspnea HEENT:- [email or web address], No sclera icterus Nose-  3L/min Neck-Supple Neck,No JVD,.  Lungs- improving air movement no wheezing or rales CV- S1, S2 normal, regular  Abd-  +ve B.Sounds, Abd Soft, No tenderness,    Extremity/Skin:- 1+  edema, pedal pulses present Psych-affect is appropriate, oriented x3 Neuro-generalized weakness, no  new focal deficits, no tremors   Data Review   CBC w Diff:  Lab Results  Component Value Date   WBC 21.1 (H) 07/18/2019   HGB 11.1 (L) 07/18/2019   HCT 37.8 (L) 07/18/2019   PLT 379 07/18/2019   LYMPHOPCT 16 06/13/2019   MONOPCT 9 06/13/2019   EOSPCT 4 06/13/2019   BASOPCT 1 06/13/2019    CMP:  Lab Results  Component Value Date   NA 142 07/18/2019   K 4.5 07/18/2019   CL 99 07/18/2019   CO2 34 (H) 07/18/2019   BUN 27 (H) 07/18/2019   CREATININE 0.56 (L) 07/18/2019   PROT 6.4 (L) 07/14/2019   ALBUMIN 3.5 07/14/2019   BILITOT 0.4 07/14/2019   ALKPHOS 63 07/14/2019   AST 16 07/14/2019   ALT 29 07/14/2019  .   Total Discharge time is about 33  minutes  Roxan Hockey M.D on 07/18/2019 at 2:59 PM  Go to [email or web address] -  for contact info  [facility] - Office  [phone]

## 2019-07-18 NOTE — Progress Notes (Signed)
NAME:  Wesley [name], MRN:  [id number], DOB:  Jun 19, 1965, LOS: 4 ADMISSION DATE:  07/14/2019, CONSULTATION DATE:  07/14/2019 REFERRING MD:  Dr. Denton Brick, [facility], CHIEF COMPLAINT:  Short of breath   Brief History   54 yo male former smoker with recurrent COPD exacerbation with Pseudomonal tracheobronchitis.  Past Medical History  Osteoporosis, Neuropathy, Chronic back pain, s/p Rt VATS bullectomy 01/16/13, s/p Lt VATS bullectomy 08/28/13, Asbestos exposure, Lt lung hernia, Chronic pain  Significant Hospital Events   5/10 Admit 5/11 episode of confusion 5/12 change back to home pain regimen 5/14 add hypertonic saline  Consults:    Procedures:    Significant Diagnostic Tests:   A1AT 01/13/09 >> 165  PFT 11/20/12 >> FEV1 0.86 (21%), FEV1% 30, TLC 10.41 (145%), DLCO 42%  CT chest 06/15/19 >> severe emphysema with bullous changes at apices, prior wedge resection from Rt and Lt upper lobes, lung hernia on Lt  Echo 06/16/19 >> EF 65 to 70%  Doppler legs 06/16/19 >> no DVT  Micro Data:  Sputum 4/29 >> Pseudomonas aeruginosa SARS CoV2 PCR 5/10 >> negative Sputum 5/12 >> oral flora  Antimicrobials:  Vancomycin 5/10 >> 5/11 Cefepime 5/10 >>   Interim history/subjective:  Feels congested in chest and has trouble bringing up phlegm.  Has swelling in his legs.    Objective   Blood pressure 125/71, pulse 91, temperature 98 F (36.7 C), temperature source Oral, resp. rate 20, height 6\' 1"  (1.854 m), weight 86.3 kg, SpO2 98 %.        Intake/Output Summary (Last 24 hours) at 07/18/2019 1313 Last data filed at 07/18/2019 1100 Gross per 24 hour  Intake 951.63 ml  Output 1100 ml  Net -148.37 ml   Filed Weights   07/14/19 1337 07/14/19 2011  Weight: 86.2 kg 86.3 kg    Examination:  General - alert Eyes - pupils reactive ENT - no sinus tenderness, no stridor Cardiac - regular rate/rhythm, no murmur Chest - decreased BS, faint b/l expiratory wheeze Abdomen - soft, non tender,  + bowel sounds Extremities - 1+ edema Skin - no rashes Neuro - normal strength, moves extremities, follows commands Psych - normal mood and behavior   Resolved Hospital Problem list     Assessment & Plan:   Acute on chronic hypoxic/hypercapnic respiratory failure from Pseudomonal tracheobronchitis causing COPD exacerbation. - day 5 of Abx - continue solumedrol; can likely change to prednisone over the weekend - add hypertonic saline nebulizer therapy 5/14 - continue pulmicort bid and duoneb q4h - prn albuterol - continue singulair - goal SpO2 90 to 95%  Leg edema. - likely from cor pulmonale - continue lasix - continue to optimize respiratory status - he had Echo from April 2021 >> don't think he needs repeat Echo at this time  Steroid induced hyperglycemia. - SSI  D/w Dr. Denton Brick  Best practice:  Diet: Regular diet DVT prophylaxis: lovenox GI prophylaxis: not indicated Mobility: As tolerated Code Status: Full code Disposition: telemetry; if he continues to improve then might be ready for hospital d/c in next 24 to 48 hrs  Labs:   CMP Latest Ref Rng & Units 07/18/2019 07/16/2019 07/14/2019  Glucose 70 - 99 mg/dL 158(H) - 117(H)  BUN 6 - 20 mg/dL 27(H) - 18  Creatinine 0.61 - 1.24 mg/dL 0.56(L) 0.63 0.81  Sodium 135 - 145 mmol/L 142 - 140  Potassium 3.5 - 5.1 mmol/L 4.5 - 3.7  Chloride 98 - 111 mmol/L 99 - 91(L)  CO2 22 -  32 mmol/L 34(H) - 39(H)  Calcium 8.9 - 10.3 mg/dL 8.8(L) - 9.1  Total Protein 6.5 - 8.1 g/dL - - 6.4(L)  Total Bilirubin 0.3 - 1.2 mg/dL - - 0.4  Alkaline Phos 38 - 126 U/L - - 63  AST 15 - 41 U/L - - 16  ALT 0 - 44 U/L - - 29    CBC Latest Ref Rng & Units 07/18/2019 07/15/2019 07/14/2019  WBC 4.0 - 10.5 K/uL 21.1(H) 17.9(H) 16.1(H)  Hemoglobin 13.0 - 17.0 g/dL 11.1(L) 11.0(L) 11.6(L)  Hematocrit 39.0 - 52.0 % 37.8(L) 36.7(L) 38.8(L)  Platelets 150 - 400 K/uL 379 326 382    ABG    Component Value Date/Time   PHART 7.329 (L) 07/15/2019  0948   PCO2ART 68.8 (HH) 07/15/2019 0948   PO2ART 82.2 (L) 07/15/2019 0948   HCO3 31.4 (H) 07/15/2019 0948   TCO2 21.5 02/24/2014 1558   O2SAT 95.7 07/15/2019 0948    CBG (last 3)  Recent Labs    07/17/19 2036 07/18/19 0812 07/18/19 1055  GLUCAP 181* 170* 224*    Signature:  Chesley Mires, MD [place] Pager - [phone] 07/18/2019, 1:13 PM

## 2019-07-18 NOTE — Discharge Instructions (Signed)
1)Very low-salt diet advised 2)Weigh yourself daily, call if you gain more than 3 pounds in 1 day or more than 5 pounds in 1 week as your diuretic medications may need to be adjusted 3)Limit your Fluid  intake to no more than 60 ounces (1.8 Liters) per day 4)you need oxygen at home at 3 to 4 L via nasal cannula continuously while awake and while asleep--- smoking or having open fires around oxygen can cause fire, significant injury and death 5) please use nicotine patch to help you quit smoking 6) follow-up with pulmonologist Dr. Elsworth Soho over the next week or 2 as advised 7) follow-up with your cardiologist at [facility] cardiology in [place] 8) follow-up with your primary care team at [facility] for refills of your pain medications 9)Avoid ibuprofen/Advil/Aleve/Motrin/Goody Powders/Naproxen/BC powders/Meloxicam/Diclofenac/Indomethacin and other Nonsteroidal anti-inflammatory medications as these will make you more likely to bleed and can cause stomach ulcers, can also cause Kidney problems. 10)Taper off your prednisone very slowly as advised

## 2019-07-19 DIAGNOSIS — R69 Illness, unspecified: Secondary | ICD-10-CM | POA: Diagnosis not present

## 2019-07-21 ENCOUNTER — Other Ambulatory Visit: Payer: Self-pay

## 2019-07-22 DIAGNOSIS — E1151 Type 2 diabetes mellitus with diabetic peripheral angiopathy without gangrene: Secondary | ICD-10-CM | POA: Diagnosis not present

## 2019-07-22 DIAGNOSIS — J208 Acute bronchitis due to other specified organisms: Secondary | ICD-10-CM | POA: Diagnosis not present

## 2019-07-22 DIAGNOSIS — B965 Pseudomonas (aeruginosa) (mallei) (pseudomallei) as the cause of diseases classified elsewhere: Secondary | ICD-10-CM | POA: Diagnosis not present

## 2019-07-22 DIAGNOSIS — I872 Venous insufficiency (chronic) (peripheral): Secondary | ICD-10-CM | POA: Diagnosis not present

## 2019-07-22 DIAGNOSIS — J441 Chronic obstructive pulmonary disease with (acute) exacerbation: Secondary | ICD-10-CM | POA: Diagnosis not present

## 2019-07-22 DIAGNOSIS — J440 Chronic obstructive pulmonary disease with acute lower respiratory infection: Secondary | ICD-10-CM | POA: Diagnosis not present

## 2019-07-22 DIAGNOSIS — T380X5D Adverse effect of glucocorticoids and synthetic analogues, subsequent encounter: Secondary | ICD-10-CM | POA: Diagnosis not present

## 2019-07-22 DIAGNOSIS — J9621 Acute and chronic respiratory failure with hypoxia: Secondary | ICD-10-CM | POA: Diagnosis not present

## 2019-07-22 DIAGNOSIS — I279 Pulmonary heart disease, unspecified: Secondary | ICD-10-CM | POA: Diagnosis not present

## 2019-07-22 DIAGNOSIS — E1165 Type 2 diabetes mellitus with hyperglycemia: Secondary | ICD-10-CM | POA: Diagnosis not present

## 2019-07-22 DIAGNOSIS — R269 Unspecified abnormalities of gait and mobility: Secondary | ICD-10-CM | POA: Diagnosis not present

## 2019-07-22 DIAGNOSIS — J9622 Acute and chronic respiratory failure with hypercapnia: Secondary | ICD-10-CM | POA: Diagnosis not present

## 2019-07-23 ENCOUNTER — Telehealth: Payer: Self-pay | Admitting: Pulmonary Disease

## 2019-07-23 NOTE — Telephone Encounter (Signed)
Dr. Halford Chessman, please advise if you are okay with [place] providing the verbal orders on requested info.

## 2019-07-24 DIAGNOSIS — J441 Chronic obstructive pulmonary disease with (acute) exacerbation: Secondary | ICD-10-CM | POA: Diagnosis not present

## 2019-07-24 DIAGNOSIS — I279 Pulmonary heart disease, unspecified: Secondary | ICD-10-CM | POA: Diagnosis not present

## 2019-07-24 DIAGNOSIS — R269 Unspecified abnormalities of gait and mobility: Secondary | ICD-10-CM | POA: Diagnosis not present

## 2019-07-24 NOTE — Telephone Encounter (Signed)
ATC Kristie- Line rings multiple times and there was no VM

## 2019-07-24 NOTE — Telephone Encounter (Signed)
Okay to give verbal order for skilled nursing 2x per week for 3 weeks.  Can use diagnosis codes for COPD with emphysema, and chronic hypoxic/hypercapnic respiratory failure.

## 2019-07-25 DIAGNOSIS — E1165 Type 2 diabetes mellitus with hyperglycemia: Secondary | ICD-10-CM | POA: Diagnosis not present

## 2019-07-25 DIAGNOSIS — B965 Pseudomonas (aeruginosa) (mallei) (pseudomallei) as the cause of diseases classified elsewhere: Secondary | ICD-10-CM | POA: Diagnosis not present

## 2019-07-25 DIAGNOSIS — I872 Venous insufficiency (chronic) (peripheral): Secondary | ICD-10-CM | POA: Diagnosis not present

## 2019-07-25 DIAGNOSIS — E1151 Type 2 diabetes mellitus with diabetic peripheral angiopathy without gangrene: Secondary | ICD-10-CM | POA: Diagnosis not present

## 2019-07-25 DIAGNOSIS — J441 Chronic obstructive pulmonary disease with (acute) exacerbation: Secondary | ICD-10-CM | POA: Diagnosis not present

## 2019-07-25 DIAGNOSIS — J440 Chronic obstructive pulmonary disease with acute lower respiratory infection: Secondary | ICD-10-CM | POA: Diagnosis not present

## 2019-07-25 DIAGNOSIS — J9622 Acute and chronic respiratory failure with hypercapnia: Secondary | ICD-10-CM | POA: Diagnosis not present

## 2019-07-25 DIAGNOSIS — J9621 Acute and chronic respiratory failure with hypoxia: Secondary | ICD-10-CM | POA: Diagnosis not present

## 2019-07-25 DIAGNOSIS — T380X5D Adverse effect of glucocorticoids and synthetic analogues, subsequent encounter: Secondary | ICD-10-CM | POA: Diagnosis not present

## 2019-07-25 DIAGNOSIS — J208 Acute bronchitis due to other specified organisms: Secondary | ICD-10-CM | POA: Diagnosis not present

## 2019-07-25 NOTE — Telephone Encounter (Signed)
Anderson Malta with [facility] is returning phone call. Anderson Malta phone number is [phone].

## 2019-07-25 NOTE — Telephone Encounter (Signed)
LMTCB

## 2019-07-28 NOTE — Telephone Encounter (Signed)
ATC Drue Dun and [employer] [facility]

## 2019-07-29 DIAGNOSIS — T380X5D Adverse effect of glucocorticoids and synthetic analogues, subsequent encounter: Secondary | ICD-10-CM | POA: Diagnosis not present

## 2019-07-29 DIAGNOSIS — J440 Chronic obstructive pulmonary disease with acute lower respiratory infection: Secondary | ICD-10-CM | POA: Diagnosis not present

## 2019-07-29 DIAGNOSIS — J208 Acute bronchitis due to other specified organisms: Secondary | ICD-10-CM | POA: Diagnosis not present

## 2019-07-29 DIAGNOSIS — E1165 Type 2 diabetes mellitus with hyperglycemia: Secondary | ICD-10-CM | POA: Diagnosis not present

## 2019-07-29 DIAGNOSIS — B965 Pseudomonas (aeruginosa) (mallei) (pseudomallei) as the cause of diseases classified elsewhere: Secondary | ICD-10-CM | POA: Diagnosis not present

## 2019-07-29 DIAGNOSIS — E1151 Type 2 diabetes mellitus with diabetic peripheral angiopathy without gangrene: Secondary | ICD-10-CM | POA: Diagnosis not present

## 2019-07-29 DIAGNOSIS — J9621 Acute and chronic respiratory failure with hypoxia: Secondary | ICD-10-CM | POA: Diagnosis not present

## 2019-07-29 DIAGNOSIS — J9622 Acute and chronic respiratory failure with hypercapnia: Secondary | ICD-10-CM | POA: Diagnosis not present

## 2019-07-29 DIAGNOSIS — J441 Chronic obstructive pulmonary disease with (acute) exacerbation: Secondary | ICD-10-CM | POA: Diagnosis not present

## 2019-07-29 DIAGNOSIS — I872 Venous insufficiency (chronic) (peripheral): Secondary | ICD-10-CM | POA: Diagnosis not present

## 2019-07-30 NOTE — Telephone Encounter (Signed)
Left a voicemail for Drue Dun to contact our office back regarding patient verbal order  for skilled nursing .

## 2019-08-06 NOTE — Telephone Encounter (Signed)
Received a call from [place] with [facility] stating to her that VS was okay with [place] providing verbal orders for skilled nursing. Kristi verbalized understanding. Nothing further needed.

## 2019-08-07 DIAGNOSIS — E1151 Type 2 diabetes mellitus with diabetic peripheral angiopathy without gangrene: Secondary | ICD-10-CM | POA: Diagnosis not present

## 2019-08-07 DIAGNOSIS — J9621 Acute and chronic respiratory failure with hypoxia: Secondary | ICD-10-CM | POA: Diagnosis not present

## 2019-08-07 DIAGNOSIS — I872 Venous insufficiency (chronic) (peripheral): Secondary | ICD-10-CM | POA: Diagnosis not present

## 2019-08-07 DIAGNOSIS — T380X5D Adverse effect of glucocorticoids and synthetic analogues, subsequent encounter: Secondary | ICD-10-CM | POA: Diagnosis not present

## 2019-08-07 DIAGNOSIS — J441 Chronic obstructive pulmonary disease with (acute) exacerbation: Secondary | ICD-10-CM | POA: Diagnosis not present

## 2019-08-07 DIAGNOSIS — E1165 Type 2 diabetes mellitus with hyperglycemia: Secondary | ICD-10-CM | POA: Diagnosis not present

## 2019-08-07 DIAGNOSIS — B965 Pseudomonas (aeruginosa) (mallei) (pseudomallei) as the cause of diseases classified elsewhere: Secondary | ICD-10-CM | POA: Diagnosis not present

## 2019-08-07 DIAGNOSIS — J208 Acute bronchitis due to other specified organisms: Secondary | ICD-10-CM | POA: Diagnosis not present

## 2019-08-07 DIAGNOSIS — J440 Chronic obstructive pulmonary disease with acute lower respiratory infection: Secondary | ICD-10-CM | POA: Diagnosis not present

## 2019-08-07 DIAGNOSIS — J9622 Acute and chronic respiratory failure with hypercapnia: Secondary | ICD-10-CM | POA: Diagnosis not present

## 2019-08-09 DIAGNOSIS — J441 Chronic obstructive pulmonary disease with (acute) exacerbation: Secondary | ICD-10-CM | POA: Diagnosis not present

## 2019-08-09 DIAGNOSIS — J9621 Acute and chronic respiratory failure with hypoxia: Secondary | ICD-10-CM | POA: Diagnosis not present

## 2019-08-09 DIAGNOSIS — T380X5D Adverse effect of glucocorticoids and synthetic analogues, subsequent encounter: Secondary | ICD-10-CM | POA: Diagnosis not present

## 2019-08-09 DIAGNOSIS — E1165 Type 2 diabetes mellitus with hyperglycemia: Secondary | ICD-10-CM | POA: Diagnosis not present

## 2019-08-09 DIAGNOSIS — J208 Acute bronchitis due to other specified organisms: Secondary | ICD-10-CM | POA: Diagnosis not present

## 2019-08-09 DIAGNOSIS — I872 Venous insufficiency (chronic) (peripheral): Secondary | ICD-10-CM | POA: Diagnosis not present

## 2019-08-09 DIAGNOSIS — J9622 Acute and chronic respiratory failure with hypercapnia: Secondary | ICD-10-CM | POA: Diagnosis not present

## 2019-08-09 DIAGNOSIS — J440 Chronic obstructive pulmonary disease with acute lower respiratory infection: Secondary | ICD-10-CM | POA: Diagnosis not present

## 2019-08-09 DIAGNOSIS — E1151 Type 2 diabetes mellitus with diabetic peripheral angiopathy without gangrene: Secondary | ICD-10-CM | POA: Diagnosis not present

## 2019-08-09 DIAGNOSIS — B965 Pseudomonas (aeruginosa) (mallei) (pseudomallei) as the cause of diseases classified elsewhere: Secondary | ICD-10-CM | POA: Diagnosis not present

## 2019-08-12 ENCOUNTER — Telehealth: Payer: Self-pay | Admitting: Pulmonary Disease

## 2019-08-12 DIAGNOSIS — J441 Chronic obstructive pulmonary disease with (acute) exacerbation: Secondary | ICD-10-CM | POA: Diagnosis not present

## 2019-08-12 DIAGNOSIS — I279 Pulmonary heart disease, unspecified: Secondary | ICD-10-CM | POA: Diagnosis not present

## 2019-08-12 DIAGNOSIS — R269 Unspecified abnormalities of gait and mobility: Secondary | ICD-10-CM | POA: Diagnosis not present

## 2019-08-12 MED ORDER — FUROSEMIDE 40 MG PO TABS
40.0000 mg | ORAL_TABLET | Freq: Two times a day (BID) | ORAL | 0 refills | Status: AC
Start: 2019-08-12 — End: 2019-08-15

## 2019-08-12 MED ORDER — AMOXICILLIN-POT CLAVULANATE 875-125 MG PO TABS
1.0000 | ORAL_TABLET | Freq: Two times a day (BID) | ORAL | 0 refills | Status: AC
Start: 2019-08-12 — End: ?

## 2019-08-12 MED ORDER — PREDNISONE 10 MG PO TABS
ORAL_TABLET | ORAL | 0 refills | Status: AC
Start: 2019-08-12 — End: ?

## 2019-08-12 NOTE — Telephone Encounter (Signed)
ATC pt, call went straight to VM but I could not leave a message due to it being full. Will try back.

## 2019-08-12 NOTE — Telephone Encounter (Signed)
Send script for prednisone 10 mg pill >> 4 pills daily for 3 days, 3 pills daily for 3 days, 2 pills daily for 3 days, 1 pill daily for 3 days.  Send script for augmentin 875-125 mg bid for 10 days.  Have him take lasix 40 mg bid for next 3 days.  If his symptoms don't start to improve, then he needs to go back to the hospital.

## 2019-08-12 NOTE — Telephone Encounter (Signed)
Spoke with Wesley [name]. States that he is not feeling well. Reports increased heart rate, chest pain, shortness of breath. Feels like his lungs have filled back up with fluid. He is having to increased his oxygen to 8L to be able to catch his breath. Wesley [name] is taking albuterol nebs every 4 hours with minimal relief. He feels he needs to go back to the hospital but he states that he can't sit in the ER and wait. Wesley [name] would like Dr. [name] recommendations on what he should do. Wesley [name] has an appointment with our [place] office on Friday.

## 2019-08-12 NOTE — Telephone Encounter (Signed)
Spoke with pt. He is aware of Dr. [name] recommendation. Rxs have been sent in. Nothing further was needed.

## 2019-08-14 DIAGNOSIS — R269 Unspecified abnormalities of gait and mobility: Secondary | ICD-10-CM | POA: Diagnosis not present

## 2019-08-14 DIAGNOSIS — I279 Pulmonary heart disease, unspecified: Secondary | ICD-10-CM | POA: Diagnosis not present

## 2019-08-14 DIAGNOSIS — J441 Chronic obstructive pulmonary disease with (acute) exacerbation: Secondary | ICD-10-CM | POA: Diagnosis not present

## 2019-08-15 ENCOUNTER — Inpatient Hospital Stay: Payer: Medicare HMO | Admitting: Pulmonary Disease

## 2019-08-19 ENCOUNTER — Telehealth: Payer: Self-pay | Admitting: Pulmonary Disease

## 2019-08-19 DIAGNOSIS — J441 Chronic obstructive pulmonary disease with (acute) exacerbation: Secondary | ICD-10-CM

## 2019-08-19 DIAGNOSIS — J439 Emphysema, unspecified: Secondary | ICD-10-CM

## 2019-08-19 NOTE — Telephone Encounter (Signed)
I called Drue Dun but there was no answer. LM for Drue Dun to call back

## 2019-08-19 NOTE — Telephone Encounter (Signed)
Okay to send order. 

## 2019-08-19 NOTE — Telephone Encounter (Signed)
Dr. Halford Chessman please advise if we can place this order for extended home health.  Calling to get an order for extended home health visits 2x a week for 5 weeks. Please advise. [phone]

## 2019-08-21 DIAGNOSIS — I872 Venous insufficiency (chronic) (peripheral): Secondary | ICD-10-CM | POA: Diagnosis not present

## 2019-08-21 DIAGNOSIS — J208 Acute bronchitis due to other specified organisms: Secondary | ICD-10-CM | POA: Diagnosis not present

## 2019-08-21 DIAGNOSIS — T380X5D Adverse effect of glucocorticoids and synthetic analogues, subsequent encounter: Secondary | ICD-10-CM | POA: Diagnosis not present

## 2019-08-21 DIAGNOSIS — J9621 Acute and chronic respiratory failure with hypoxia: Secondary | ICD-10-CM | POA: Diagnosis not present

## 2019-08-21 DIAGNOSIS — J9622 Acute and chronic respiratory failure with hypercapnia: Secondary | ICD-10-CM | POA: Diagnosis not present

## 2019-08-21 DIAGNOSIS — E1165 Type 2 diabetes mellitus with hyperglycemia: Secondary | ICD-10-CM | POA: Diagnosis not present

## 2019-08-21 DIAGNOSIS — J441 Chronic obstructive pulmonary disease with (acute) exacerbation: Secondary | ICD-10-CM | POA: Diagnosis not present

## 2019-08-21 DIAGNOSIS — J440 Chronic obstructive pulmonary disease with acute lower respiratory infection: Secondary | ICD-10-CM | POA: Diagnosis not present

## 2019-08-21 DIAGNOSIS — B965 Pseudomonas (aeruginosa) (mallei) (pseudomallei) as the cause of diseases classified elsewhere: Secondary | ICD-10-CM | POA: Diagnosis not present

## 2019-08-21 DIAGNOSIS — E1151 Type 2 diabetes mellitus with diabetic peripheral angiopathy without gangrene: Secondary | ICD-10-CM | POA: Diagnosis not present

## 2019-08-22 DIAGNOSIS — R269 Unspecified abnormalities of gait and mobility: Secondary | ICD-10-CM | POA: Diagnosis not present

## 2019-08-22 DIAGNOSIS — I279 Pulmonary heart disease, unspecified: Secondary | ICD-10-CM | POA: Diagnosis not present

## 2019-08-22 DIAGNOSIS — J441 Chronic obstructive pulmonary disease with (acute) exacerbation: Secondary | ICD-10-CM | POA: Diagnosis not present

## 2019-08-23 DIAGNOSIS — E1151 Type 2 diabetes mellitus with diabetic peripheral angiopathy without gangrene: Secondary | ICD-10-CM | POA: Diagnosis not present

## 2019-08-23 DIAGNOSIS — J440 Chronic obstructive pulmonary disease with acute lower respiratory infection: Secondary | ICD-10-CM | POA: Diagnosis not present

## 2019-08-23 DIAGNOSIS — B965 Pseudomonas (aeruginosa) (mallei) (pseudomallei) as the cause of diseases classified elsewhere: Secondary | ICD-10-CM | POA: Diagnosis not present

## 2019-08-23 DIAGNOSIS — T380X5D Adverse effect of glucocorticoids and synthetic analogues, subsequent encounter: Secondary | ICD-10-CM | POA: Diagnosis not present

## 2019-08-23 DIAGNOSIS — J208 Acute bronchitis due to other specified organisms: Secondary | ICD-10-CM | POA: Diagnosis not present

## 2019-08-23 DIAGNOSIS — J441 Chronic obstructive pulmonary disease with (acute) exacerbation: Secondary | ICD-10-CM | POA: Diagnosis not present

## 2019-08-23 DIAGNOSIS — J9621 Acute and chronic respiratory failure with hypoxia: Secondary | ICD-10-CM | POA: Diagnosis not present

## 2019-08-23 DIAGNOSIS — I872 Venous insufficiency (chronic) (peripheral): Secondary | ICD-10-CM | POA: Diagnosis not present

## 2019-08-23 DIAGNOSIS — E1165 Type 2 diabetes mellitus with hyperglycemia: Secondary | ICD-10-CM | POA: Diagnosis not present

## 2019-08-23 DIAGNOSIS — J9622 Acute and chronic respiratory failure with hypercapnia: Secondary | ICD-10-CM | POA: Diagnosis not present

## 2019-08-28 DIAGNOSIS — G8922 Chronic post-thoracotomy pain: Secondary | ICD-10-CM | POA: Diagnosis not present

## 2019-08-28 DIAGNOSIS — Z5181 Encounter for therapeutic drug level monitoring: Secondary | ICD-10-CM | POA: Diagnosis not present

## 2019-08-28 DIAGNOSIS — G8921 Chronic pain due to trauma: Secondary | ICD-10-CM | POA: Diagnosis not present

## 2019-08-28 DIAGNOSIS — M25551 Pain in right hip: Secondary | ICD-10-CM | POA: Diagnosis not present

## 2019-08-28 DIAGNOSIS — Z79891 Long term (current) use of opiate analgesic: Secondary | ICD-10-CM | POA: Diagnosis not present

## 2019-08-28 DIAGNOSIS — G894 Chronic pain syndrome: Secondary | ICD-10-CM | POA: Diagnosis not present

## 2019-08-28 DIAGNOSIS — M25561 Pain in right knee: Secondary | ICD-10-CM | POA: Diagnosis not present

## 2019-08-28 DIAGNOSIS — G8929 Other chronic pain: Secondary | ICD-10-CM | POA: Diagnosis not present

## 2019-08-28 DIAGNOSIS — Z79899 Other long term (current) drug therapy: Secondary | ICD-10-CM | POA: Diagnosis not present

## 2019-08-29 DIAGNOSIS — J9621 Acute and chronic respiratory failure with hypoxia: Secondary | ICD-10-CM | POA: Diagnosis not present

## 2019-08-29 DIAGNOSIS — J441 Chronic obstructive pulmonary disease with (acute) exacerbation: Secondary | ICD-10-CM | POA: Diagnosis not present

## 2019-08-29 DIAGNOSIS — J9622 Acute and chronic respiratory failure with hypercapnia: Secondary | ICD-10-CM | POA: Diagnosis not present

## 2019-08-29 DIAGNOSIS — T380X5D Adverse effect of glucocorticoids and synthetic analogues, subsequent encounter: Secondary | ICD-10-CM | POA: Diagnosis not present

## 2019-08-29 DIAGNOSIS — J440 Chronic obstructive pulmonary disease with acute lower respiratory infection: Secondary | ICD-10-CM | POA: Diagnosis not present

## 2019-08-29 DIAGNOSIS — B965 Pseudomonas (aeruginosa) (mallei) (pseudomallei) as the cause of diseases classified elsewhere: Secondary | ICD-10-CM | POA: Diagnosis not present

## 2019-08-29 DIAGNOSIS — I279 Pulmonary heart disease, unspecified: Secondary | ICD-10-CM | POA: Diagnosis not present

## 2019-08-29 DIAGNOSIS — E1165 Type 2 diabetes mellitus with hyperglycemia: Secondary | ICD-10-CM | POA: Diagnosis not present

## 2019-08-29 DIAGNOSIS — E1151 Type 2 diabetes mellitus with diabetic peripheral angiopathy without gangrene: Secondary | ICD-10-CM | POA: Diagnosis not present

## 2019-08-29 DIAGNOSIS — I872 Venous insufficiency (chronic) (peripheral): Secondary | ICD-10-CM | POA: Diagnosis not present

## 2019-08-29 DIAGNOSIS — R269 Unspecified abnormalities of gait and mobility: Secondary | ICD-10-CM | POA: Diagnosis not present

## 2019-08-29 DIAGNOSIS — J208 Acute bronchitis due to other specified organisms: Secondary | ICD-10-CM | POA: Diagnosis not present

## 2019-08-30 DIAGNOSIS — E1151 Type 2 diabetes mellitus with diabetic peripheral angiopathy without gangrene: Secondary | ICD-10-CM | POA: Diagnosis not present

## 2019-08-30 DIAGNOSIS — J440 Chronic obstructive pulmonary disease with acute lower respiratory infection: Secondary | ICD-10-CM | POA: Diagnosis not present

## 2019-08-30 DIAGNOSIS — T380X5D Adverse effect of glucocorticoids and synthetic analogues, subsequent encounter: Secondary | ICD-10-CM | POA: Diagnosis not present

## 2019-08-30 DIAGNOSIS — J441 Chronic obstructive pulmonary disease with (acute) exacerbation: Secondary | ICD-10-CM | POA: Diagnosis not present

## 2019-08-30 DIAGNOSIS — I872 Venous insufficiency (chronic) (peripheral): Secondary | ICD-10-CM | POA: Diagnosis not present

## 2019-08-30 DIAGNOSIS — E1165 Type 2 diabetes mellitus with hyperglycemia: Secondary | ICD-10-CM | POA: Diagnosis not present

## 2019-08-30 DIAGNOSIS — B965 Pseudomonas (aeruginosa) (mallei) (pseudomallei) as the cause of diseases classified elsewhere: Secondary | ICD-10-CM | POA: Diagnosis not present

## 2019-08-30 DIAGNOSIS — J208 Acute bronchitis due to other specified organisms: Secondary | ICD-10-CM | POA: Diagnosis not present

## 2019-08-30 DIAGNOSIS — J9621 Acute and chronic respiratory failure with hypoxia: Secondary | ICD-10-CM | POA: Diagnosis not present

## 2019-08-30 DIAGNOSIS — J9622 Acute and chronic respiratory failure with hypercapnia: Secondary | ICD-10-CM | POA: Diagnosis not present

## 2019-09-01 NOTE — Telephone Encounter (Signed)
Called and left voice mail for [place]. Will follow up to give orders.

## 2019-09-04 DIAGNOSIS — J9621 Acute and chronic respiratory failure with hypoxia: Secondary | ICD-10-CM | POA: Diagnosis not present

## 2019-09-04 DIAGNOSIS — I872 Venous insufficiency (chronic) (peripheral): Secondary | ICD-10-CM | POA: Diagnosis not present

## 2019-09-04 DIAGNOSIS — J441 Chronic obstructive pulmonary disease with (acute) exacerbation: Secondary | ICD-10-CM | POA: Diagnosis not present

## 2019-09-04 DIAGNOSIS — B965 Pseudomonas (aeruginosa) (mallei) (pseudomallei) as the cause of diseases classified elsewhere: Secondary | ICD-10-CM | POA: Diagnosis not present

## 2019-09-04 DIAGNOSIS — E1151 Type 2 diabetes mellitus with diabetic peripheral angiopathy without gangrene: Secondary | ICD-10-CM | POA: Diagnosis not present

## 2019-09-04 DIAGNOSIS — E1165 Type 2 diabetes mellitus with hyperglycemia: Secondary | ICD-10-CM | POA: Diagnosis not present

## 2019-09-04 DIAGNOSIS — J208 Acute bronchitis due to other specified organisms: Secondary | ICD-10-CM | POA: Diagnosis not present

## 2019-09-04 DIAGNOSIS — J440 Chronic obstructive pulmonary disease with acute lower respiratory infection: Secondary | ICD-10-CM | POA: Diagnosis not present

## 2019-09-04 DIAGNOSIS — T380X5D Adverse effect of glucocorticoids and synthetic analogues, subsequent encounter: Secondary | ICD-10-CM | POA: Diagnosis not present

## 2019-09-04 DIAGNOSIS — J9622 Acute and chronic respiratory failure with hypercapnia: Secondary | ICD-10-CM | POA: Diagnosis not present

## 2019-09-04 NOTE — H&P (Signed)
[facility] History & Physical  Rob Doctor, hospital MD   Wesley [name] 07/14/2019  Chief Complaint: Dr Ashok Cordia, ED HPI: The patient is a 54 y.o. year-old w/ hx of severe COPD, home O2, neuropathy, chronic back pain/ hx back surgery, L THA, asbestos exposure who presents today sent from lung MD office appt for admission due to acute bronchitis w/ COPD exacerbation.   Per Dr [name] note from today's visit, pt was in hospital in 4/21 w/ COPD exac w/ sputum cx that grew Pseudomonas pan sensitive and was dc'd on pred and levaquin. Took his last doses yesterday and may have been sent home on doxycycline not levaquin. Today breathing is worse again, coughing yellow sputum w/ some blood streaking, wheezing more and legs more swollen. Taking 40 bid lasix since hosp dc.  Pulm recommends IV abx w/ Pseudomonas coverage, repeat IV solumedrol and give pulmicort and duoneb in hospital, cont po lasix 40 bid for now.   Pt seen in ED, states that he has severe COPD but he will do well for 2-3 yrs then be in hospital for 2- 3 mos. He also states he has chronic pain . Sees [facility] doctor. Also sees [facility] monthly and has gotten down from 80mg  tid > 60mg  tid  > 30 mg long-acting (MS Contin) tid now, and short-acting Oxy IR was taking 16 pills per day (30mg ) and is now down to 6 pills (30mg ) per day, this is per the patient.   Pt lives w/ his wife , no tobacco or etoh, smoke e-cigs (no juice types) and uses nicotine patch.   Denies any sig chest pain. Constipated and trying miralax at home but was told needs to drink w/ more liquid.  No abd pain or n/v/d.  No fevers.   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 Had bilat lung surgery (? Bleb resection) which improved his breathing.       ROS  denies CP  no joint pain   no HA  no blurry vision  no rash  no diarrhea  no nausea/ vomiting  no dysuria  no difficulty voiding  no  change in urine color    Past Medical History  Past Medical History:  Diagnosis Date  . Asbestos exposure   . Avascular necrosis of femur ([place])   . Back pain   . Bullous emphysema ([place])   . COPD (chronic obstructive pulmonary disease) ([place])   . Neuropathy   . On home oxygen therapy    "3L prn" (07/19/2016)  . Osteoporosis   . Tumor of lung    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, 2017   Past Surgical History  Past Surgical History:  Procedure Laterality Date  . BACK SURGERY    . EYE SURGERY     left eye cornea and lens replaced- blind in left eye  . JOINT REPLACEMENT    . LEFT HEART CATH AND CORONARY ANGIOGRAPHY N/A 07/20/2016   Procedure: Left Heart Cath and Coronary Angiography;  Surgeon: Lorretta Harp, MD;  Location: [facility];  Service: Cardiovascular;  Laterality: N/A;  . LUNG SURGERY    . TOTAL HIP ARTHROPLASTY Right 09/24/2015   Procedure: TOTAL HIP ARTHROPLASTY ANTERIOR APPROACH;  Surgeon: Gaynelle Arabian, MD;  Location: [facility];  Service: Orthopedics;  Laterality: Right;   Family History  Family History  Problem Relation Age of Onset  . Heart failure Father   .  Diabetes Father   . Diabetes Mother   . Cancer Mother        male cancer   Social History  reports that he quit smoking about 8 years ago. His smoking use included e-cigarettes. He smoked 1.00 pack per day. He has never used smokeless tobacco. He reports that he does not drink alcohol and does not use drugs. Allergies  Allergies  Allergen Reactions  . Ambien [Zolpidem Tartrate] Other (See Comments)    hallucinations  . Melatonin Itching  . Zolpidem Other (See Comments)    Altered mental status   Home medications Prior to Admission medications   Medication Sig Start Date End Date Taking? Authorizing Provider  acetaminophen (TYLENOL) 325 MG tablet Take 2 tablets by mouth every 4 (four) hours as needed.    Yes [provider]  ADVAIR DISKUS 250-50 MCG/DOSE AEPB  Inhale 1 puff into the lungs 2 (two) times daily. 06/14/15  Yes [provider]  guaiFENesin-dextromethorphan (ROBITUSSIN DM) 100-10 MG/5ML syrup Take 5 mLs by mouth every 4 (four) hours as needed for cough. 07/04/19  Yes Kathie Dike, MD  montelukast (SINGULAIR) 10 MG tablet Take 1 tablet by mouth daily. 07/07/16  Yes [provider]  morphine (MS CONTIN) 30 MG 12 hr tablet Take 30 mg by mouth every 8 (eight) hours. 12/30/18  Yes [provider]  mupirocin ointment (BACTROBAN) 2 % Place 1 application into the nose every other day.  12/03/18  Yes [provider]  naloxone HCl (NARCAN) 4 MG/0.1ML LIQD Place 4 mg into the nose as directed. To prevent overdose   Yes [provider]  ondansetron (ZOFRAN-ODT) 4 MG disintegrating tablet Take 1 tablet by mouth every 8 (eight) hours as needed for nausea or vomiting.  09/05/18  Yes [provider]  oxycodone (ROXICODONE) 30 MG immediate release tablet Take 30 mg by mouth every 4 (four) hours.  06/12/19  Yes [provider]  OXYGEN Inhale 4-5 L into the lungs continuous.   Yes [provider]  Polyethyl Glycol-Propyl Glycol (SYSTANE) 0.4-0.3 % SOLN Place 1-2 drops into the left eye daily as needed (for dry eye relief).    Yes [provider]  polyethylene glycol powder (GLYCOLAX/MIRALAX) 17 GM/SCOOP powder Take 17 g by mouth daily.   Yes [provider]  pregabalin (LYRICA) 150 MG capsule Take 150 mg by mouth 3 (three) times daily.  12/30/18  Yes [provider]  albuterol (PROVENTIL) (2.5 MG/3ML) 0.083% nebulizer solution Take 3 mLs (2.5 mg total) by nebulization every 6 (six) hours as needed for wheezing or shortness of breath. 07/18/19   Roxan Hockey, MD  albuterol (VENTOLIN HFA) 108 (90 Base) MCG/ACT inhaler Inhale 2 puffs into the lungs every 4 (four) hours as needed for wheezing or shortness of breath. Shortness of breath 07/18/19   Roxan Hockey, MD   amoxicillin-clavulanate (AUGMENTIN) 875-125 MG tablet Take 1 tablet by mouth 2 (two) times daily. 08/12/19   Chesley Mires, MD  aspirin 81 MG chewable tablet Chew 1 tablet (81 mg total) by mouth daily with breakfast. 07/18/19   Roxan Hockey, MD  furosemide (LASIX) 40 MG tablet Take 1 tablet (40 mg total) by mouth 2 (two) times daily for 3 days. 08/12/19 08/15/19  Chesley Mires, MD  guaiFENesin (MUCINEX) 600 MG 12 hr tablet Take 1 tablet (600 mg total) by mouth 2 (two) times daily. 07/18/19   Emokpae, Courage, MD  INCRUSE ELLIPTA 62.5 MCG/INH AEPB Inhale 1 puff into the lungs daily. 07/18/19  Roxan Hockey, MD  nicotine (NICODERM CQ - DOSED IN MG/24 HOURS) 14 mg/24hr patch Place 1 patch (14 mg total) onto the skin daily. 07/19/19   Roxan Hockey, MD  predniSONE (DELTASONE) 10 MG tablet 4 pills daily for 3 days, 3 pills daily for 3 days, 2 pills daily for 3 days, 1 pill daily for 3 days. 08/12/19   Chesley Mires, MD       Exam Gen alert, dusky, not in distress, on 5L  No rash, cyanosis or gangrene Sclera anicteric, throat clear  No jvd or bruits Chest decreased BS bilat diffusely, mild-mod exp wheezing bilat RRR no MRG Abd soft ntnd no mass or ascites +bs GU normal male MS no joint effusions or deformity Ext 1-2+ pretib / ankle edema, no wounds or ulcers  Neuro is alert, Ox 3 , nf    Home meds:  - lasix 40 qd  - pregabalin 150 tid/ oxycodone 30mg  q 4hrs/ naloxone prn/ MS contin 30 tid/ hycodan 5-1.5 qid prn  - aspirin 81 qd  - advair diskus 250-50 bid/ home O2 5L/ levaquin 500 qd/ pred taper/ montelukast 10mg  qd/ incruse ellipta 62.5 mcg qd/ prn albuterol act+ nebs  - prn's/ vitamins/ supplements     Assessment/ Plan: 1. Acute bronchitis/ acute exacerbation of severe baseline COPD - per pulm recommendations will admit, start broad spec IV abx per pharm to cover pseudomonas, get sputum cx, IV steroids, nebs scheduled and prn, inhaled steroids, O2 therapy.  2. Vol excess/ LE  edema - mild-mod pretib edema, cont home po lasix 40 qd here, ^ if needed. Last admit echo showed normal EF 3. Tobacco use - cont nicotine patch 4. Chronic pain syndrome - s/p fall from many yrs ago. F/B [facility] pain center, see HPI. Cont meds.        Kelly Splinter  MD 07/14/2019, 4:05 PM

## 2019-09-10 DIAGNOSIS — E1151 Type 2 diabetes mellitus with diabetic peripheral angiopathy without gangrene: Secondary | ICD-10-CM | POA: Diagnosis not present

## 2019-09-10 DIAGNOSIS — J440 Chronic obstructive pulmonary disease with acute lower respiratory infection: Secondary | ICD-10-CM | POA: Diagnosis not present

## 2019-09-10 DIAGNOSIS — B965 Pseudomonas (aeruginosa) (mallei) (pseudomallei) as the cause of diseases classified elsewhere: Secondary | ICD-10-CM | POA: Diagnosis not present

## 2019-09-10 DIAGNOSIS — J9622 Acute and chronic respiratory failure with hypercapnia: Secondary | ICD-10-CM | POA: Diagnosis not present

## 2019-09-10 DIAGNOSIS — J441 Chronic obstructive pulmonary disease with (acute) exacerbation: Secondary | ICD-10-CM | POA: Diagnosis not present

## 2019-09-10 DIAGNOSIS — T380X5D Adverse effect of glucocorticoids and synthetic analogues, subsequent encounter: Secondary | ICD-10-CM | POA: Diagnosis not present

## 2019-09-10 DIAGNOSIS — J208 Acute bronchitis due to other specified organisms: Secondary | ICD-10-CM | POA: Diagnosis not present

## 2019-09-10 DIAGNOSIS — J9621 Acute and chronic respiratory failure with hypoxia: Secondary | ICD-10-CM | POA: Diagnosis not present

## 2019-09-10 DIAGNOSIS — I872 Venous insufficiency (chronic) (peripheral): Secondary | ICD-10-CM | POA: Diagnosis not present

## 2019-09-10 DIAGNOSIS — E1165 Type 2 diabetes mellitus with hyperglycemia: Secondary | ICD-10-CM | POA: Diagnosis not present

## 2019-09-11 DIAGNOSIS — I279 Pulmonary heart disease, unspecified: Secondary | ICD-10-CM | POA: Diagnosis not present

## 2019-09-11 DIAGNOSIS — R269 Unspecified abnormalities of gait and mobility: Secondary | ICD-10-CM | POA: Diagnosis not present

## 2019-09-11 DIAGNOSIS — J441 Chronic obstructive pulmonary disease with (acute) exacerbation: Secondary | ICD-10-CM | POA: Diagnosis not present

## 2019-09-11 DIAGNOSIS — Z6826 Body mass index (BMI) 26.0-26.9, adult: Secondary | ICD-10-CM | POA: Diagnosis not present

## 2019-09-11 DIAGNOSIS — G6289 Other specified polyneuropathies: Secondary | ICD-10-CM | POA: Diagnosis not present

## 2019-09-11 DIAGNOSIS — R69 Illness, unspecified: Secondary | ICD-10-CM | POA: Diagnosis not present

## 2019-09-11 DIAGNOSIS — K2100 Gastro-esophageal reflux disease with esophagitis, without bleeding: Secondary | ICD-10-CM | POA: Diagnosis not present

## 2019-09-12 DIAGNOSIS — J441 Chronic obstructive pulmonary disease with (acute) exacerbation: Secondary | ICD-10-CM | POA: Diagnosis not present

## 2019-09-12 DIAGNOSIS — E1151 Type 2 diabetes mellitus with diabetic peripheral angiopathy without gangrene: Secondary | ICD-10-CM | POA: Diagnosis not present

## 2019-09-12 DIAGNOSIS — T380X5D Adverse effect of glucocorticoids and synthetic analogues, subsequent encounter: Secondary | ICD-10-CM | POA: Diagnosis not present

## 2019-09-12 DIAGNOSIS — E1165 Type 2 diabetes mellitus with hyperglycemia: Secondary | ICD-10-CM | POA: Diagnosis not present

## 2019-09-12 DIAGNOSIS — J9621 Acute and chronic respiratory failure with hypoxia: Secondary | ICD-10-CM | POA: Diagnosis not present

## 2019-09-12 DIAGNOSIS — J208 Acute bronchitis due to other specified organisms: Secondary | ICD-10-CM | POA: Diagnosis not present

## 2019-09-12 DIAGNOSIS — B965 Pseudomonas (aeruginosa) (mallei) (pseudomallei) as the cause of diseases classified elsewhere: Secondary | ICD-10-CM | POA: Diagnosis not present

## 2019-09-12 DIAGNOSIS — J9622 Acute and chronic respiratory failure with hypercapnia: Secondary | ICD-10-CM | POA: Diagnosis not present

## 2019-09-12 DIAGNOSIS — J440 Chronic obstructive pulmonary disease with acute lower respiratory infection: Secondary | ICD-10-CM | POA: Diagnosis not present

## 2019-09-12 DIAGNOSIS — I872 Venous insufficiency (chronic) (peripheral): Secondary | ICD-10-CM | POA: Diagnosis not present

## 2019-09-15 DIAGNOSIS — E1165 Type 2 diabetes mellitus with hyperglycemia: Secondary | ICD-10-CM | POA: Diagnosis not present

## 2019-09-15 DIAGNOSIS — E1151 Type 2 diabetes mellitus with diabetic peripheral angiopathy without gangrene: Secondary | ICD-10-CM | POA: Diagnosis not present

## 2019-09-15 DIAGNOSIS — J440 Chronic obstructive pulmonary disease with acute lower respiratory infection: Secondary | ICD-10-CM | POA: Diagnosis not present

## 2019-09-15 DIAGNOSIS — J208 Acute bronchitis due to other specified organisms: Secondary | ICD-10-CM | POA: Diagnosis not present

## 2019-09-15 DIAGNOSIS — B965 Pseudomonas (aeruginosa) (mallei) (pseudomallei) as the cause of diseases classified elsewhere: Secondary | ICD-10-CM | POA: Diagnosis not present

## 2019-09-15 DIAGNOSIS — I872 Venous insufficiency (chronic) (peripheral): Secondary | ICD-10-CM | POA: Diagnosis not present

## 2019-09-15 DIAGNOSIS — J9622 Acute and chronic respiratory failure with hypercapnia: Secondary | ICD-10-CM | POA: Diagnosis not present

## 2019-09-15 DIAGNOSIS — T380X5D Adverse effect of glucocorticoids and synthetic analogues, subsequent encounter: Secondary | ICD-10-CM | POA: Diagnosis not present

## 2019-09-15 DIAGNOSIS — J441 Chronic obstructive pulmonary disease with (acute) exacerbation: Secondary | ICD-10-CM | POA: Diagnosis not present

## 2019-09-15 DIAGNOSIS — J9621 Acute and chronic respiratory failure with hypoxia: Secondary | ICD-10-CM | POA: Diagnosis not present

## 2019-09-18 DIAGNOSIS — I872 Venous insufficiency (chronic) (peripheral): Secondary | ICD-10-CM | POA: Diagnosis not present

## 2019-09-18 DIAGNOSIS — J440 Chronic obstructive pulmonary disease with acute lower respiratory infection: Secondary | ICD-10-CM | POA: Diagnosis not present

## 2019-09-18 DIAGNOSIS — E1165 Type 2 diabetes mellitus with hyperglycemia: Secondary | ICD-10-CM | POA: Diagnosis not present

## 2019-09-18 DIAGNOSIS — J9621 Acute and chronic respiratory failure with hypoxia: Secondary | ICD-10-CM | POA: Diagnosis not present

## 2019-09-18 DIAGNOSIS — E1151 Type 2 diabetes mellitus with diabetic peripheral angiopathy without gangrene: Secondary | ICD-10-CM | POA: Diagnosis not present

## 2019-09-18 DIAGNOSIS — B965 Pseudomonas (aeruginosa) (mallei) (pseudomallei) as the cause of diseases classified elsewhere: Secondary | ICD-10-CM | POA: Diagnosis not present

## 2019-09-18 DIAGNOSIS — J9622 Acute and chronic respiratory failure with hypercapnia: Secondary | ICD-10-CM | POA: Diagnosis not present

## 2019-09-18 DIAGNOSIS — J208 Acute bronchitis due to other specified organisms: Secondary | ICD-10-CM | POA: Diagnosis not present

## 2019-09-18 DIAGNOSIS — J441 Chronic obstructive pulmonary disease with (acute) exacerbation: Secondary | ICD-10-CM | POA: Diagnosis not present

## 2019-09-18 DIAGNOSIS — T380X5D Adverse effect of glucocorticoids and synthetic analogues, subsequent encounter: Secondary | ICD-10-CM | POA: Diagnosis not present

## 2019-09-21 DIAGNOSIS — R269 Unspecified abnormalities of gait and mobility: Secondary | ICD-10-CM | POA: Diagnosis not present

## 2019-09-21 DIAGNOSIS — I279 Pulmonary heart disease, unspecified: Secondary | ICD-10-CM | POA: Diagnosis not present

## 2019-09-21 DIAGNOSIS — J441 Chronic obstructive pulmonary disease with (acute) exacerbation: Secondary | ICD-10-CM | POA: Diagnosis not present

## 2019-10-12 DIAGNOSIS — R269 Unspecified abnormalities of gait and mobility: Secondary | ICD-10-CM | POA: Diagnosis not present

## 2019-10-12 DIAGNOSIS — J441 Chronic obstructive pulmonary disease with (acute) exacerbation: Secondary | ICD-10-CM | POA: Diagnosis not present

## 2019-10-12 DIAGNOSIS — I279 Pulmonary heart disease, unspecified: Secondary | ICD-10-CM | POA: Diagnosis not present

## 2019-10-14 DIAGNOSIS — Z5181 Encounter for therapeutic drug level monitoring: Secondary | ICD-10-CM | POA: Diagnosis not present

## 2019-10-22 DIAGNOSIS — J441 Chronic obstructive pulmonary disease with (acute) exacerbation: Secondary | ICD-10-CM | POA: Diagnosis not present

## 2019-10-22 DIAGNOSIS — I279 Pulmonary heart disease, unspecified: Secondary | ICD-10-CM | POA: Diagnosis not present

## 2019-10-22 DIAGNOSIS — R269 Unspecified abnormalities of gait and mobility: Secondary | ICD-10-CM | POA: Diagnosis not present

## 2019-10-23 DIAGNOSIS — G8922 Chronic post-thoracotomy pain: Secondary | ICD-10-CM | POA: Diagnosis not present

## 2019-10-23 DIAGNOSIS — M25551 Pain in right hip: Secondary | ICD-10-CM | POA: Diagnosis not present

## 2019-10-23 DIAGNOSIS — Z79891 Long term (current) use of opiate analgesic: Secondary | ICD-10-CM | POA: Diagnosis not present

## 2019-10-23 DIAGNOSIS — Z79899 Other long term (current) drug therapy: Secondary | ICD-10-CM | POA: Diagnosis not present

## 2019-10-23 DIAGNOSIS — Z5181 Encounter for therapeutic drug level monitoring: Secondary | ICD-10-CM | POA: Diagnosis not present

## 2019-10-23 DIAGNOSIS — M25561 Pain in right knee: Secondary | ICD-10-CM | POA: Diagnosis not present

## 2019-10-23 DIAGNOSIS — G894 Chronic pain syndrome: Secondary | ICD-10-CM | POA: Diagnosis not present

## 2019-11-12 DIAGNOSIS — R269 Unspecified abnormalities of gait and mobility: Secondary | ICD-10-CM | POA: Diagnosis not present

## 2019-11-12 DIAGNOSIS — I279 Pulmonary heart disease, unspecified: Secondary | ICD-10-CM | POA: Diagnosis not present

## 2019-11-12 DIAGNOSIS — J441 Chronic obstructive pulmonary disease with (acute) exacerbation: Secondary | ICD-10-CM | POA: Diagnosis not present

## 2019-11-20 DIAGNOSIS — I279 Pulmonary heart disease, unspecified: Secondary | ICD-10-CM | POA: Diagnosis not present

## 2019-11-20 DIAGNOSIS — R269 Unspecified abnormalities of gait and mobility: Secondary | ICD-10-CM | POA: Diagnosis not present

## 2019-11-20 DIAGNOSIS — J441 Chronic obstructive pulmonary disease with (acute) exacerbation: Secondary | ICD-10-CM | POA: Diagnosis not present

## 2019-11-22 DIAGNOSIS — J441 Chronic obstructive pulmonary disease with (acute) exacerbation: Secondary | ICD-10-CM | POA: Diagnosis not present

## 2019-11-22 DIAGNOSIS — I279 Pulmonary heart disease, unspecified: Secondary | ICD-10-CM | POA: Diagnosis not present

## 2019-11-22 DIAGNOSIS — R269 Unspecified abnormalities of gait and mobility: Secondary | ICD-10-CM | POA: Diagnosis not present

## 2019-11-27 DIAGNOSIS — J441 Chronic obstructive pulmonary disease with (acute) exacerbation: Secondary | ICD-10-CM | POA: Diagnosis not present

## 2019-11-27 DIAGNOSIS — I279 Pulmonary heart disease, unspecified: Secondary | ICD-10-CM | POA: Diagnosis not present

## 2019-11-27 DIAGNOSIS — R269 Unspecified abnormalities of gait and mobility: Secondary | ICD-10-CM | POA: Diagnosis not present

## 2019-12-08 DIAGNOSIS — E559 Vitamin D deficiency, unspecified: Secondary | ICD-10-CM | POA: Diagnosis not present

## 2019-12-08 DIAGNOSIS — R5383 Other fatigue: Secondary | ICD-10-CM | POA: Diagnosis not present

## 2019-12-08 DIAGNOSIS — G6289 Other specified polyneuropathies: Secondary | ICD-10-CM | POA: Diagnosis not present

## 2019-12-08 DIAGNOSIS — Z79899 Other long term (current) drug therapy: Secondary | ICD-10-CM | POA: Diagnosis not present

## 2019-12-08 DIAGNOSIS — K2100 Gastro-esophageal reflux disease with esophagitis, without bleeding: Secondary | ICD-10-CM | POA: Diagnosis not present

## 2019-12-08 DIAGNOSIS — J441 Chronic obstructive pulmonary disease with (acute) exacerbation: Secondary | ICD-10-CM | POA: Diagnosis not present

## 2019-12-08 DIAGNOSIS — Z6826 Body mass index (BMI) 26.0-26.9, adult: Secondary | ICD-10-CM | POA: Diagnosis not present

## 2019-12-12 DIAGNOSIS — R269 Unspecified abnormalities of gait and mobility: Secondary | ICD-10-CM | POA: Diagnosis not present

## 2019-12-12 DIAGNOSIS — J441 Chronic obstructive pulmonary disease with (acute) exacerbation: Secondary | ICD-10-CM | POA: Diagnosis not present

## 2019-12-12 DIAGNOSIS — I279 Pulmonary heart disease, unspecified: Secondary | ICD-10-CM | POA: Diagnosis not present

## 2019-12-18 DIAGNOSIS — G894 Chronic pain syndrome: Secondary | ICD-10-CM | POA: Diagnosis not present

## 2019-12-18 DIAGNOSIS — M25561 Pain in right knee: Secondary | ICD-10-CM | POA: Diagnosis not present

## 2019-12-18 DIAGNOSIS — G8921 Chronic pain due to trauma: Secondary | ICD-10-CM | POA: Diagnosis not present

## 2019-12-18 DIAGNOSIS — G8929 Other chronic pain: Secondary | ICD-10-CM | POA: Diagnosis not present

## 2019-12-18 DIAGNOSIS — Z79899 Other long term (current) drug therapy: Secondary | ICD-10-CM | POA: Diagnosis not present

## 2019-12-18 DIAGNOSIS — M25551 Pain in right hip: Secondary | ICD-10-CM | POA: Diagnosis not present

## 2019-12-18 DIAGNOSIS — G8922 Chronic post-thoracotomy pain: Secondary | ICD-10-CM | POA: Diagnosis not present

## 2019-12-22 DIAGNOSIS — J441 Chronic obstructive pulmonary disease with (acute) exacerbation: Secondary | ICD-10-CM | POA: Diagnosis not present

## 2019-12-22 DIAGNOSIS — R269 Unspecified abnormalities of gait and mobility: Secondary | ICD-10-CM | POA: Diagnosis not present

## 2019-12-22 DIAGNOSIS — I279 Pulmonary heart disease, unspecified: Secondary | ICD-10-CM | POA: Diagnosis not present

## 2020-01-12 DIAGNOSIS — R269 Unspecified abnormalities of gait and mobility: Secondary | ICD-10-CM | POA: Diagnosis not present

## 2020-01-12 DIAGNOSIS — J441 Chronic obstructive pulmonary disease with (acute) exacerbation: Secondary | ICD-10-CM | POA: Diagnosis not present

## 2020-01-12 DIAGNOSIS — I279 Pulmonary heart disease, unspecified: Secondary | ICD-10-CM | POA: Diagnosis not present

## 2020-01-22 DIAGNOSIS — J441 Chronic obstructive pulmonary disease with (acute) exacerbation: Secondary | ICD-10-CM | POA: Diagnosis not present

## 2020-01-22 DIAGNOSIS — I279 Pulmonary heart disease, unspecified: Secondary | ICD-10-CM | POA: Diagnosis not present

## 2020-01-22 DIAGNOSIS — R269 Unspecified abnormalities of gait and mobility: Secondary | ICD-10-CM | POA: Diagnosis not present

## 2020-02-11 DIAGNOSIS — I279 Pulmonary heart disease, unspecified: Secondary | ICD-10-CM | POA: Diagnosis not present

## 2020-02-11 DIAGNOSIS — Z5181 Encounter for therapeutic drug level monitoring: Secondary | ICD-10-CM | POA: Diagnosis not present

## 2020-02-11 DIAGNOSIS — R269 Unspecified abnormalities of gait and mobility: Secondary | ICD-10-CM | POA: Diagnosis not present

## 2020-02-11 DIAGNOSIS — G8929 Other chronic pain: Secondary | ICD-10-CM | POA: Diagnosis not present

## 2020-02-11 DIAGNOSIS — G894 Chronic pain syndrome: Secondary | ICD-10-CM | POA: Diagnosis not present

## 2020-02-11 DIAGNOSIS — M25551 Pain in right hip: Secondary | ICD-10-CM | POA: Diagnosis not present

## 2020-02-11 DIAGNOSIS — Z79891 Long term (current) use of opiate analgesic: Secondary | ICD-10-CM | POA: Diagnosis not present

## 2020-02-11 DIAGNOSIS — M25561 Pain in right knee: Secondary | ICD-10-CM | POA: Diagnosis not present

## 2020-02-11 DIAGNOSIS — J441 Chronic obstructive pulmonary disease with (acute) exacerbation: Secondary | ICD-10-CM | POA: Diagnosis not present

## 2020-02-21 DIAGNOSIS — J441 Chronic obstructive pulmonary disease with (acute) exacerbation: Secondary | ICD-10-CM | POA: Diagnosis not present

## 2020-02-21 DIAGNOSIS — I279 Pulmonary heart disease, unspecified: Secondary | ICD-10-CM | POA: Diagnosis not present

## 2020-02-21 DIAGNOSIS — R269 Unspecified abnormalities of gait and mobility: Secondary | ICD-10-CM | POA: Diagnosis not present

## 2020-03-09 DIAGNOSIS — R5383 Other fatigue: Secondary | ICD-10-CM | POA: Diagnosis not present

## 2020-03-09 DIAGNOSIS — Z6826 Body mass index (BMI) 26.0-26.9, adult: Secondary | ICD-10-CM | POA: Diagnosis not present

## 2020-03-09 DIAGNOSIS — S46812A Strain of other muscles, fascia and tendons at shoulder and upper arm level, left arm, initial encounter: Secondary | ICD-10-CM | POA: Diagnosis not present

## 2020-03-09 DIAGNOSIS — J441 Chronic obstructive pulmonary disease with (acute) exacerbation: Secondary | ICD-10-CM | POA: Diagnosis not present

## 2020-03-09 DIAGNOSIS — G6289 Other specified polyneuropathies: Secondary | ICD-10-CM | POA: Diagnosis not present

## 2020-03-09 DIAGNOSIS — K2100 Gastro-esophageal reflux disease with esophagitis, without bleeding: Secondary | ICD-10-CM | POA: Diagnosis not present

## 2020-03-13 DIAGNOSIS — I279 Pulmonary heart disease, unspecified: Secondary | ICD-10-CM | POA: Diagnosis not present

## 2020-03-13 DIAGNOSIS — R269 Unspecified abnormalities of gait and mobility: Secondary | ICD-10-CM | POA: Diagnosis not present

## 2020-03-13 DIAGNOSIS — J441 Chronic obstructive pulmonary disease with (acute) exacerbation: Secondary | ICD-10-CM | POA: Diagnosis not present

## 2020-03-23 DIAGNOSIS — J441 Chronic obstructive pulmonary disease with (acute) exacerbation: Secondary | ICD-10-CM | POA: Diagnosis not present

## 2020-03-23 DIAGNOSIS — I279 Pulmonary heart disease, unspecified: Secondary | ICD-10-CM | POA: Diagnosis not present

## 2020-03-23 DIAGNOSIS — R269 Unspecified abnormalities of gait and mobility: Secondary | ICD-10-CM | POA: Diagnosis not present

## 2020-03-31 DIAGNOSIS — G8922 Chronic post-thoracotomy pain: Secondary | ICD-10-CM | POA: Diagnosis not present

## 2020-03-31 DIAGNOSIS — G894 Chronic pain syndrome: Secondary | ICD-10-CM | POA: Diagnosis not present

## 2020-03-31 DIAGNOSIS — Z79891 Long term (current) use of opiate analgesic: Secondary | ICD-10-CM | POA: Diagnosis not present

## 2020-03-31 DIAGNOSIS — Z79899 Other long term (current) drug therapy: Secondary | ICD-10-CM | POA: Diagnosis not present

## 2020-03-31 DIAGNOSIS — G8921 Chronic pain due to trauma: Secondary | ICD-10-CM | POA: Diagnosis not present

## 2020-03-31 DIAGNOSIS — G8929 Other chronic pain: Secondary | ICD-10-CM | POA: Diagnosis not present

## 2020-03-31 DIAGNOSIS — M25551 Pain in right hip: Secondary | ICD-10-CM | POA: Diagnosis not present

## 2020-03-31 DIAGNOSIS — Z5181 Encounter for therapeutic drug level monitoring: Secondary | ICD-10-CM | POA: Diagnosis not present

## 2020-03-31 DIAGNOSIS — M25561 Pain in right knee: Secondary | ICD-10-CM | POA: Diagnosis not present

## 2020-04-13 DIAGNOSIS — J441 Chronic obstructive pulmonary disease with (acute) exacerbation: Secondary | ICD-10-CM | POA: Diagnosis not present

## 2020-04-13 DIAGNOSIS — R269 Unspecified abnormalities of gait and mobility: Secondary | ICD-10-CM | POA: Diagnosis not present

## 2020-04-13 DIAGNOSIS — I279 Pulmonary heart disease, unspecified: Secondary | ICD-10-CM | POA: Diagnosis not present

## 2020-04-23 DIAGNOSIS — R269 Unspecified abnormalities of gait and mobility: Secondary | ICD-10-CM | POA: Diagnosis not present

## 2020-04-23 DIAGNOSIS — J441 Chronic obstructive pulmonary disease with (acute) exacerbation: Secondary | ICD-10-CM | POA: Diagnosis not present

## 2020-04-23 DIAGNOSIS — I279 Pulmonary heart disease, unspecified: Secondary | ICD-10-CM | POA: Diagnosis not present

## 2020-04-26 DIAGNOSIS — Z5181 Encounter for therapeutic drug level monitoring: Secondary | ICD-10-CM | POA: Diagnosis not present

## 2020-05-11 DIAGNOSIS — J441 Chronic obstructive pulmonary disease with (acute) exacerbation: Secondary | ICD-10-CM | POA: Diagnosis not present

## 2020-05-11 DIAGNOSIS — R269 Unspecified abnormalities of gait and mobility: Secondary | ICD-10-CM | POA: Diagnosis not present

## 2020-05-11 DIAGNOSIS — I279 Pulmonary heart disease, unspecified: Secondary | ICD-10-CM | POA: Diagnosis not present

## 2020-05-21 DIAGNOSIS — J441 Chronic obstructive pulmonary disease with (acute) exacerbation: Secondary | ICD-10-CM | POA: Diagnosis not present

## 2020-05-21 DIAGNOSIS — I279 Pulmonary heart disease, unspecified: Secondary | ICD-10-CM | POA: Diagnosis not present

## 2020-05-21 DIAGNOSIS — R269 Unspecified abnormalities of gait and mobility: Secondary | ICD-10-CM | POA: Diagnosis not present

## 2020-05-24 DIAGNOSIS — Z5181 Encounter for therapeutic drug level monitoring: Secondary | ICD-10-CM | POA: Diagnosis not present

## 2020-05-24 DIAGNOSIS — M25551 Pain in right hip: Secondary | ICD-10-CM | POA: Diagnosis not present

## 2020-05-24 DIAGNOSIS — M25561 Pain in right knee: Secondary | ICD-10-CM | POA: Diagnosis not present

## 2020-05-24 DIAGNOSIS — G894 Chronic pain syndrome: Secondary | ICD-10-CM | POA: Diagnosis not present

## 2020-05-24 DIAGNOSIS — G8922 Chronic post-thoracotomy pain: Secondary | ICD-10-CM | POA: Diagnosis not present

## 2020-05-24 DIAGNOSIS — Z79899 Other long term (current) drug therapy: Secondary | ICD-10-CM | POA: Diagnosis not present

## 2020-05-24 DIAGNOSIS — Z79891 Long term (current) use of opiate analgesic: Secondary | ICD-10-CM | POA: Diagnosis not present

## 2020-06-07 DIAGNOSIS — K2100 Gastro-esophageal reflux disease with esophagitis, without bleeding: Secondary | ICD-10-CM | POA: Diagnosis not present

## 2020-06-07 DIAGNOSIS — M79601 Pain in right arm: Secondary | ICD-10-CM | POA: Diagnosis not present

## 2020-06-07 DIAGNOSIS — G6289 Other specified polyneuropathies: Secondary | ICD-10-CM | POA: Diagnosis not present

## 2020-06-07 DIAGNOSIS — Z125 Encounter for screening for malignant neoplasm of prostate: Secondary | ICD-10-CM | POA: Diagnosis not present

## 2020-06-07 DIAGNOSIS — R5383 Other fatigue: Secondary | ICD-10-CM | POA: Diagnosis not present

## 2020-06-07 DIAGNOSIS — R7303 Prediabetes: Secondary | ICD-10-CM | POA: Diagnosis not present

## 2020-06-07 DIAGNOSIS — Z0000 Encounter for general adult medical examination without abnormal findings: Secondary | ICD-10-CM | POA: Diagnosis not present

## 2020-06-07 DIAGNOSIS — J441 Chronic obstructive pulmonary disease with (acute) exacerbation: Secondary | ICD-10-CM | POA: Diagnosis not present

## 2020-06-07 DIAGNOSIS — S46812A Strain of other muscles, fascia and tendons at shoulder and upper arm level, left arm, initial encounter: Secondary | ICD-10-CM | POA: Diagnosis not present

## 2020-06-07 DIAGNOSIS — Z1331 Encounter for screening for depression: Secondary | ICD-10-CM | POA: Diagnosis not present

## 2020-06-07 DIAGNOSIS — Z6825 Body mass index (BMI) 25.0-25.9, adult: Secondary | ICD-10-CM | POA: Diagnosis not present

## 2020-06-11 DIAGNOSIS — I279 Pulmonary heart disease, unspecified: Secondary | ICD-10-CM | POA: Diagnosis not present

## 2020-06-11 DIAGNOSIS — R269 Unspecified abnormalities of gait and mobility: Secondary | ICD-10-CM | POA: Diagnosis not present

## 2020-06-11 DIAGNOSIS — J441 Chronic obstructive pulmonary disease with (acute) exacerbation: Secondary | ICD-10-CM | POA: Diagnosis not present

## 2020-06-21 DIAGNOSIS — I739 Peripheral vascular disease, unspecified: Secondary | ICD-10-CM | POA: Diagnosis not present

## 2020-06-21 DIAGNOSIS — J441 Chronic obstructive pulmonary disease with (acute) exacerbation: Secondary | ICD-10-CM | POA: Diagnosis not present

## 2020-06-21 DIAGNOSIS — I279 Pulmonary heart disease, unspecified: Secondary | ICD-10-CM | POA: Diagnosis not present

## 2020-06-21 DIAGNOSIS — R269 Unspecified abnormalities of gait and mobility: Secondary | ICD-10-CM | POA: Diagnosis not present

## 2020-07-11 DIAGNOSIS — R269 Unspecified abnormalities of gait and mobility: Secondary | ICD-10-CM | POA: Diagnosis not present

## 2020-07-11 DIAGNOSIS — I279 Pulmonary heart disease, unspecified: Secondary | ICD-10-CM | POA: Diagnosis not present

## 2020-07-11 DIAGNOSIS — J441 Chronic obstructive pulmonary disease with (acute) exacerbation: Secondary | ICD-10-CM | POA: Diagnosis not present

## 2020-07-12 DIAGNOSIS — Z5181 Encounter for therapeutic drug level monitoring: Secondary | ICD-10-CM | POA: Diagnosis not present

## 2020-07-21 DIAGNOSIS — I279 Pulmonary heart disease, unspecified: Secondary | ICD-10-CM | POA: Diagnosis not present

## 2020-07-21 DIAGNOSIS — R269 Unspecified abnormalities of gait and mobility: Secondary | ICD-10-CM | POA: Diagnosis not present

## 2020-07-21 DIAGNOSIS — J441 Chronic obstructive pulmonary disease with (acute) exacerbation: Secondary | ICD-10-CM | POA: Diagnosis not present

## 2020-08-09 DIAGNOSIS — G8929 Other chronic pain: Secondary | ICD-10-CM | POA: Diagnosis not present

## 2020-08-09 DIAGNOSIS — M25561 Pain in right knee: Secondary | ICD-10-CM | POA: Diagnosis not present

## 2020-08-09 DIAGNOSIS — Z79891 Long term (current) use of opiate analgesic: Secondary | ICD-10-CM | POA: Diagnosis not present

## 2020-08-09 DIAGNOSIS — M25551 Pain in right hip: Secondary | ICD-10-CM | POA: Diagnosis not present

## 2020-08-09 DIAGNOSIS — Z5181 Encounter for therapeutic drug level monitoring: Secondary | ICD-10-CM | POA: Diagnosis not present

## 2020-08-11 DIAGNOSIS — R269 Unspecified abnormalities of gait and mobility: Secondary | ICD-10-CM | POA: Diagnosis not present

## 2020-08-11 DIAGNOSIS — I279 Pulmonary heart disease, unspecified: Secondary | ICD-10-CM | POA: Diagnosis not present

## 2020-08-11 DIAGNOSIS — J441 Chronic obstructive pulmonary disease with (acute) exacerbation: Secondary | ICD-10-CM | POA: Diagnosis not present

## 2020-08-21 DIAGNOSIS — R269 Unspecified abnormalities of gait and mobility: Secondary | ICD-10-CM | POA: Diagnosis not present

## 2020-08-21 DIAGNOSIS — J441 Chronic obstructive pulmonary disease with (acute) exacerbation: Secondary | ICD-10-CM | POA: Diagnosis not present

## 2020-08-21 DIAGNOSIS — I279 Pulmonary heart disease, unspecified: Secondary | ICD-10-CM | POA: Diagnosis not present

## 2020-09-10 DIAGNOSIS — I279 Pulmonary heart disease, unspecified: Secondary | ICD-10-CM | POA: Diagnosis not present

## 2020-09-10 DIAGNOSIS — J441 Chronic obstructive pulmonary disease with (acute) exacerbation: Secondary | ICD-10-CM | POA: Diagnosis not present

## 2020-09-10 DIAGNOSIS — R269 Unspecified abnormalities of gait and mobility: Secondary | ICD-10-CM | POA: Diagnosis not present

## 2020-09-20 DIAGNOSIS — R269 Unspecified abnormalities of gait and mobility: Secondary | ICD-10-CM | POA: Diagnosis not present

## 2020-09-20 DIAGNOSIS — I279 Pulmonary heart disease, unspecified: Secondary | ICD-10-CM | POA: Diagnosis not present

## 2020-09-20 DIAGNOSIS — J441 Chronic obstructive pulmonary disease with (acute) exacerbation: Secondary | ICD-10-CM | POA: Diagnosis not present

## 2020-10-04 DIAGNOSIS — M25551 Pain in right hip: Secondary | ICD-10-CM | POA: Diagnosis not present

## 2020-10-04 DIAGNOSIS — Z5181 Encounter for therapeutic drug level monitoring: Secondary | ICD-10-CM | POA: Diagnosis not present

## 2020-10-04 DIAGNOSIS — Z79899 Other long term (current) drug therapy: Secondary | ICD-10-CM | POA: Diagnosis not present

## 2020-10-04 DIAGNOSIS — G894 Chronic pain syndrome: Secondary | ICD-10-CM | POA: Diagnosis not present

## 2020-10-04 DIAGNOSIS — M25561 Pain in right knee: Secondary | ICD-10-CM | POA: Diagnosis not present

## 2020-10-04 DIAGNOSIS — G8922 Chronic post-thoracotomy pain: Secondary | ICD-10-CM | POA: Diagnosis not present

## 2020-10-04 DIAGNOSIS — Z79891 Long term (current) use of opiate analgesic: Secondary | ICD-10-CM | POA: Diagnosis not present

## 2020-10-04 DIAGNOSIS — G8929 Other chronic pain: Secondary | ICD-10-CM | POA: Diagnosis not present

## 2020-10-07 DIAGNOSIS — Z0000 Encounter for general adult medical examination without abnormal findings: Secondary | ICD-10-CM | POA: Diagnosis not present

## 2020-10-07 DIAGNOSIS — G6289 Other specified polyneuropathies: Secondary | ICD-10-CM | POA: Diagnosis not present

## 2020-10-07 DIAGNOSIS — K2100 Gastro-esophageal reflux disease with esophagitis, without bleeding: Secondary | ICD-10-CM | POA: Diagnosis not present

## 2020-10-07 DIAGNOSIS — J449 Chronic obstructive pulmonary disease, unspecified: Secondary | ICD-10-CM | POA: Diagnosis not present

## 2020-10-07 DIAGNOSIS — I700 Atherosclerosis of aorta: Secondary | ICD-10-CM | POA: Diagnosis not present

## 2020-10-07 DIAGNOSIS — Z6824 Body mass index (BMI) 24.0-24.9, adult: Secondary | ICD-10-CM | POA: Diagnosis not present

## 2020-10-11 DIAGNOSIS — I279 Pulmonary heart disease, unspecified: Secondary | ICD-10-CM | POA: Diagnosis not present

## 2020-10-11 DIAGNOSIS — R269 Unspecified abnormalities of gait and mobility: Secondary | ICD-10-CM | POA: Diagnosis not present

## 2020-10-11 DIAGNOSIS — J441 Chronic obstructive pulmonary disease with (acute) exacerbation: Secondary | ICD-10-CM | POA: Diagnosis not present

## 2020-10-21 DIAGNOSIS — I279 Pulmonary heart disease, unspecified: Secondary | ICD-10-CM | POA: Diagnosis not present

## 2020-10-21 DIAGNOSIS — J441 Chronic obstructive pulmonary disease with (acute) exacerbation: Secondary | ICD-10-CM | POA: Diagnosis not present

## 2020-10-21 DIAGNOSIS — R269 Unspecified abnormalities of gait and mobility: Secondary | ICD-10-CM | POA: Diagnosis not present

## 2020-11-11 DIAGNOSIS — I279 Pulmonary heart disease, unspecified: Secondary | ICD-10-CM | POA: Diagnosis not present

## 2020-11-11 DIAGNOSIS — R269 Unspecified abnormalities of gait and mobility: Secondary | ICD-10-CM | POA: Diagnosis not present

## 2020-11-11 DIAGNOSIS — J441 Chronic obstructive pulmonary disease with (acute) exacerbation: Secondary | ICD-10-CM | POA: Diagnosis not present

## 2020-11-21 DIAGNOSIS — R269 Unspecified abnormalities of gait and mobility: Secondary | ICD-10-CM | POA: Diagnosis not present

## 2020-11-21 DIAGNOSIS — I279 Pulmonary heart disease, unspecified: Secondary | ICD-10-CM | POA: Diagnosis not present

## 2020-11-21 DIAGNOSIS — J441 Chronic obstructive pulmonary disease with (acute) exacerbation: Secondary | ICD-10-CM | POA: Diagnosis not present

## 2020-11-22 DIAGNOSIS — G8922 Chronic post-thoracotomy pain: Secondary | ICD-10-CM | POA: Diagnosis not present

## 2020-11-22 DIAGNOSIS — Z5181 Encounter for therapeutic drug level monitoring: Secondary | ICD-10-CM | POA: Diagnosis not present

## 2020-11-22 DIAGNOSIS — M25551 Pain in right hip: Secondary | ICD-10-CM | POA: Diagnosis not present

## 2020-11-22 DIAGNOSIS — G8929 Other chronic pain: Secondary | ICD-10-CM | POA: Diagnosis not present

## 2020-11-22 DIAGNOSIS — G894 Chronic pain syndrome: Secondary | ICD-10-CM | POA: Diagnosis not present

## 2020-11-22 DIAGNOSIS — Z79891 Long term (current) use of opiate analgesic: Secondary | ICD-10-CM | POA: Diagnosis not present

## 2020-12-03 DIAGNOSIS — G6289 Other specified polyneuropathies: Secondary | ICD-10-CM | POA: Diagnosis not present

## 2020-12-03 DIAGNOSIS — J449 Chronic obstructive pulmonary disease, unspecified: Secondary | ICD-10-CM | POA: Diagnosis not present

## 2020-12-03 DIAGNOSIS — K2100 Gastro-esophageal reflux disease with esophagitis, without bleeding: Secondary | ICD-10-CM | POA: Diagnosis not present

## 2020-12-03 DIAGNOSIS — I700 Atherosclerosis of aorta: Secondary | ICD-10-CM | POA: Diagnosis not present

## 2020-12-11 DIAGNOSIS — J441 Chronic obstructive pulmonary disease with (acute) exacerbation: Secondary | ICD-10-CM | POA: Diagnosis not present

## 2020-12-11 DIAGNOSIS — I279 Pulmonary heart disease, unspecified: Secondary | ICD-10-CM | POA: Diagnosis not present

## 2020-12-11 DIAGNOSIS — R269 Unspecified abnormalities of gait and mobility: Secondary | ICD-10-CM | POA: Diagnosis not present

## 2020-12-21 DIAGNOSIS — J441 Chronic obstructive pulmonary disease with (acute) exacerbation: Secondary | ICD-10-CM | POA: Diagnosis not present

## 2020-12-21 DIAGNOSIS — I279 Pulmonary heart disease, unspecified: Secondary | ICD-10-CM | POA: Diagnosis not present

## 2020-12-21 DIAGNOSIS — R269 Unspecified abnormalities of gait and mobility: Secondary | ICD-10-CM | POA: Diagnosis not present

## 2021-01-03 DIAGNOSIS — J449 Chronic obstructive pulmonary disease, unspecified: Secondary | ICD-10-CM | POA: Diagnosis not present

## 2021-01-03 DIAGNOSIS — I700 Atherosclerosis of aorta: Secondary | ICD-10-CM | POA: Diagnosis not present

## 2021-01-03 DIAGNOSIS — K2100 Gastro-esophageal reflux disease with esophagitis, without bleeding: Secondary | ICD-10-CM | POA: Diagnosis not present

## 2021-01-11 DIAGNOSIS — I279 Pulmonary heart disease, unspecified: Secondary | ICD-10-CM | POA: Diagnosis not present

## 2021-01-11 DIAGNOSIS — R269 Unspecified abnormalities of gait and mobility: Secondary | ICD-10-CM | POA: Diagnosis not present

## 2021-01-11 DIAGNOSIS — J441 Chronic obstructive pulmonary disease with (acute) exacerbation: Secondary | ICD-10-CM | POA: Diagnosis not present

## 2021-01-12 DIAGNOSIS — Z5181 Encounter for therapeutic drug level monitoring: Secondary | ICD-10-CM | POA: Diagnosis not present

## 2021-01-12 DIAGNOSIS — M25551 Pain in right hip: Secondary | ICD-10-CM | POA: Diagnosis not present

## 2021-01-12 DIAGNOSIS — Z79891 Long term (current) use of opiate analgesic: Secondary | ICD-10-CM | POA: Diagnosis not present

## 2021-01-12 DIAGNOSIS — G894 Chronic pain syndrome: Secondary | ICD-10-CM | POA: Diagnosis not present

## 2021-01-12 DIAGNOSIS — Z79899 Other long term (current) drug therapy: Secondary | ICD-10-CM | POA: Diagnosis not present

## 2021-01-12 DIAGNOSIS — G8922 Chronic post-thoracotomy pain: Secondary | ICD-10-CM | POA: Diagnosis not present

## 2021-01-12 DIAGNOSIS — M25561 Pain in right knee: Secondary | ICD-10-CM | POA: Diagnosis not present

## 2021-01-12 DIAGNOSIS — G8929 Other chronic pain: Secondary | ICD-10-CM | POA: Diagnosis not present

## 2021-01-18 DIAGNOSIS — I700 Atherosclerosis of aorta: Secondary | ICD-10-CM | POA: Diagnosis not present

## 2021-01-18 DIAGNOSIS — K2100 Gastro-esophageal reflux disease with esophagitis, without bleeding: Secondary | ICD-10-CM | POA: Diagnosis not present

## 2021-01-18 DIAGNOSIS — Z6824 Body mass index (BMI) 24.0-24.9, adult: Secondary | ICD-10-CM | POA: Diagnosis not present

## 2021-01-18 DIAGNOSIS — Z0000 Encounter for general adult medical examination without abnormal findings: Secondary | ICD-10-CM | POA: Diagnosis not present

## 2021-01-18 DIAGNOSIS — G6289 Other specified polyneuropathies: Secondary | ICD-10-CM | POA: Diagnosis not present

## 2021-01-18 DIAGNOSIS — J449 Chronic obstructive pulmonary disease, unspecified: Secondary | ICD-10-CM | POA: Diagnosis not present

## 2021-01-21 DIAGNOSIS — I279 Pulmonary heart disease, unspecified: Secondary | ICD-10-CM | POA: Diagnosis not present

## 2021-01-21 DIAGNOSIS — J441 Chronic obstructive pulmonary disease with (acute) exacerbation: Secondary | ICD-10-CM | POA: Diagnosis not present

## 2021-01-21 DIAGNOSIS — R269 Unspecified abnormalities of gait and mobility: Secondary | ICD-10-CM | POA: Diagnosis not present

## 2021-02-09 DIAGNOSIS — Z5181 Encounter for therapeutic drug level monitoring: Secondary | ICD-10-CM | POA: Diagnosis not present

## 2021-02-10 DIAGNOSIS — J441 Chronic obstructive pulmonary disease with (acute) exacerbation: Secondary | ICD-10-CM | POA: Diagnosis not present

## 2021-02-10 DIAGNOSIS — R269 Unspecified abnormalities of gait and mobility: Secondary | ICD-10-CM | POA: Diagnosis not present

## 2021-02-10 DIAGNOSIS — I279 Pulmonary heart disease, unspecified: Secondary | ICD-10-CM | POA: Diagnosis not present

## 2021-02-20 DIAGNOSIS — J441 Chronic obstructive pulmonary disease with (acute) exacerbation: Secondary | ICD-10-CM | POA: Diagnosis not present

## 2021-02-20 DIAGNOSIS — I279 Pulmonary heart disease, unspecified: Secondary | ICD-10-CM | POA: Diagnosis not present

## 2021-02-20 DIAGNOSIS — R269 Unspecified abnormalities of gait and mobility: Secondary | ICD-10-CM | POA: Diagnosis not present

## 2021-03-10 DIAGNOSIS — M25551 Pain in right hip: Secondary | ICD-10-CM | POA: Diagnosis not present

## 2021-03-10 DIAGNOSIS — Z79891 Long term (current) use of opiate analgesic: Secondary | ICD-10-CM | POA: Diagnosis not present

## 2021-03-10 DIAGNOSIS — M25561 Pain in right knee: Secondary | ICD-10-CM | POA: Diagnosis not present

## 2021-03-10 DIAGNOSIS — Z5181 Encounter for therapeutic drug level monitoring: Secondary | ICD-10-CM | POA: Diagnosis not present

## 2021-03-10 DIAGNOSIS — G8929 Other chronic pain: Secondary | ICD-10-CM | POA: Diagnosis not present

## 2021-03-10 DIAGNOSIS — G894 Chronic pain syndrome: Secondary | ICD-10-CM | POA: Diagnosis not present

## 2021-03-13 DIAGNOSIS — I279 Pulmonary heart disease, unspecified: Secondary | ICD-10-CM | POA: Diagnosis not present

## 2021-03-13 DIAGNOSIS — J441 Chronic obstructive pulmonary disease with (acute) exacerbation: Secondary | ICD-10-CM | POA: Diagnosis not present

## 2021-03-13 DIAGNOSIS — R269 Unspecified abnormalities of gait and mobility: Secondary | ICD-10-CM | POA: Diagnosis not present

## 2021-03-23 DIAGNOSIS — I279 Pulmonary heart disease, unspecified: Secondary | ICD-10-CM | POA: Diagnosis not present

## 2021-03-23 DIAGNOSIS — R269 Unspecified abnormalities of gait and mobility: Secondary | ICD-10-CM | POA: Diagnosis not present

## 2021-03-23 DIAGNOSIS — J441 Chronic obstructive pulmonary disease with (acute) exacerbation: Secondary | ICD-10-CM | POA: Diagnosis not present

## 2021-04-05 DIAGNOSIS — G6289 Other specified polyneuropathies: Secondary | ICD-10-CM | POA: Diagnosis not present

## 2021-04-05 DIAGNOSIS — I700 Atherosclerosis of aorta: Secondary | ICD-10-CM | POA: Diagnosis not present

## 2021-04-05 DIAGNOSIS — J449 Chronic obstructive pulmonary disease, unspecified: Secondary | ICD-10-CM | POA: Diagnosis not present

## 2021-04-05 DIAGNOSIS — K2100 Gastro-esophageal reflux disease with esophagitis, without bleeding: Secondary | ICD-10-CM | POA: Diagnosis not present

## 2021-04-13 DIAGNOSIS — R269 Unspecified abnormalities of gait and mobility: Secondary | ICD-10-CM | POA: Diagnosis not present

## 2021-04-13 DIAGNOSIS — J441 Chronic obstructive pulmonary disease with (acute) exacerbation: Secondary | ICD-10-CM | POA: Diagnosis not present

## 2021-04-13 DIAGNOSIS — I279 Pulmonary heart disease, unspecified: Secondary | ICD-10-CM | POA: Diagnosis not present

## 2021-04-19 DIAGNOSIS — J449 Chronic obstructive pulmonary disease, unspecified: Secondary | ICD-10-CM | POA: Diagnosis not present

## 2021-04-19 DIAGNOSIS — I700 Atherosclerosis of aorta: Secondary | ICD-10-CM | POA: Diagnosis not present

## 2021-04-19 DIAGNOSIS — K2100 Gastro-esophageal reflux disease with esophagitis, without bleeding: Secondary | ICD-10-CM | POA: Diagnosis not present

## 2021-04-19 DIAGNOSIS — G6289 Other specified polyneuropathies: Secondary | ICD-10-CM | POA: Diagnosis not present

## 2021-04-19 DIAGNOSIS — Z6824 Body mass index (BMI) 24.0-24.9, adult: Secondary | ICD-10-CM | POA: Diagnosis not present

## 2021-04-23 DIAGNOSIS — I279 Pulmonary heart disease, unspecified: Secondary | ICD-10-CM | POA: Diagnosis not present

## 2021-04-23 DIAGNOSIS — R269 Unspecified abnormalities of gait and mobility: Secondary | ICD-10-CM | POA: Diagnosis not present

## 2021-04-23 DIAGNOSIS — J441 Chronic obstructive pulmonary disease with (acute) exacerbation: Secondary | ICD-10-CM | POA: Diagnosis not present

## 2021-04-29 DIAGNOSIS — Z5181 Encounter for therapeutic drug level monitoring: Secondary | ICD-10-CM | POA: Diagnosis not present

## 2021-05-05 DIAGNOSIS — G8921 Chronic pain due to trauma: Secondary | ICD-10-CM | POA: Diagnosis not present

## 2021-05-05 DIAGNOSIS — M25551 Pain in right hip: Secondary | ICD-10-CM | POA: Diagnosis not present

## 2021-05-05 DIAGNOSIS — G8922 Chronic post-thoracotomy pain: Secondary | ICD-10-CM | POA: Diagnosis not present

## 2021-05-05 DIAGNOSIS — M25561 Pain in right knee: Secondary | ICD-10-CM | POA: Diagnosis not present

## 2021-05-05 DIAGNOSIS — G894 Chronic pain syndrome: Secondary | ICD-10-CM | POA: Diagnosis not present

## 2021-05-05 DIAGNOSIS — Z5181 Encounter for therapeutic drug level monitoring: Secondary | ICD-10-CM | POA: Diagnosis not present

## 2021-05-05 DIAGNOSIS — Z79899 Other long term (current) drug therapy: Secondary | ICD-10-CM | POA: Diagnosis not present

## 2021-05-05 DIAGNOSIS — G8929 Other chronic pain: Secondary | ICD-10-CM | POA: Diagnosis not present

## 2021-05-11 DIAGNOSIS — R269 Unspecified abnormalities of gait and mobility: Secondary | ICD-10-CM | POA: Diagnosis not present

## 2021-05-11 DIAGNOSIS — J441 Chronic obstructive pulmonary disease with (acute) exacerbation: Secondary | ICD-10-CM | POA: Diagnosis not present

## 2021-05-11 DIAGNOSIS — I279 Pulmonary heart disease, unspecified: Secondary | ICD-10-CM | POA: Diagnosis not present

## 2021-05-21 DIAGNOSIS — I279 Pulmonary heart disease, unspecified: Secondary | ICD-10-CM | POA: Diagnosis not present

## 2021-05-21 DIAGNOSIS — J441 Chronic obstructive pulmonary disease with (acute) exacerbation: Secondary | ICD-10-CM | POA: Diagnosis not present

## 2021-05-21 DIAGNOSIS — R269 Unspecified abnormalities of gait and mobility: Secondary | ICD-10-CM | POA: Diagnosis not present

## 2021-06-01 DIAGNOSIS — Z5181 Encounter for therapeutic drug level monitoring: Secondary | ICD-10-CM | POA: Diagnosis not present

## 2021-06-11 DIAGNOSIS — J441 Chronic obstructive pulmonary disease with (acute) exacerbation: Secondary | ICD-10-CM | POA: Diagnosis not present

## 2021-06-11 DIAGNOSIS — I279 Pulmonary heart disease, unspecified: Secondary | ICD-10-CM | POA: Diagnosis not present

## 2021-06-11 DIAGNOSIS — R269 Unspecified abnormalities of gait and mobility: Secondary | ICD-10-CM | POA: Diagnosis not present

## 2021-06-21 DIAGNOSIS — I279 Pulmonary heart disease, unspecified: Secondary | ICD-10-CM | POA: Diagnosis not present

## 2021-06-21 DIAGNOSIS — J441 Chronic obstructive pulmonary disease with (acute) exacerbation: Secondary | ICD-10-CM | POA: Diagnosis not present

## 2021-06-21 DIAGNOSIS — R269 Unspecified abnormalities of gait and mobility: Secondary | ICD-10-CM | POA: Diagnosis not present

## 2021-07-11 DIAGNOSIS — J441 Chronic obstructive pulmonary disease with (acute) exacerbation: Secondary | ICD-10-CM | POA: Diagnosis not present

## 2021-07-11 DIAGNOSIS — R269 Unspecified abnormalities of gait and mobility: Secondary | ICD-10-CM | POA: Diagnosis not present

## 2021-07-11 DIAGNOSIS — I279 Pulmonary heart disease, unspecified: Secondary | ICD-10-CM | POA: Diagnosis not present

## 2021-07-21 DIAGNOSIS — J441 Chronic obstructive pulmonary disease with (acute) exacerbation: Secondary | ICD-10-CM | POA: Diagnosis not present

## 2021-07-21 DIAGNOSIS — R269 Unspecified abnormalities of gait and mobility: Secondary | ICD-10-CM | POA: Diagnosis not present

## 2021-07-21 DIAGNOSIS — I279 Pulmonary heart disease, unspecified: Secondary | ICD-10-CM | POA: Diagnosis not present

## 2021-07-27 DIAGNOSIS — Z5181 Encounter for therapeutic drug level monitoring: Secondary | ICD-10-CM | POA: Diagnosis not present

## 2021-08-11 DIAGNOSIS — R269 Unspecified abnormalities of gait and mobility: Secondary | ICD-10-CM | POA: Diagnosis not present

## 2021-08-11 DIAGNOSIS — J441 Chronic obstructive pulmonary disease with (acute) exacerbation: Secondary | ICD-10-CM | POA: Diagnosis not present

## 2021-08-11 DIAGNOSIS — I279 Pulmonary heart disease, unspecified: Secondary | ICD-10-CM | POA: Diagnosis not present

## 2021-08-21 DIAGNOSIS — I279 Pulmonary heart disease, unspecified: Secondary | ICD-10-CM | POA: Diagnosis not present

## 2021-08-21 DIAGNOSIS — J441 Chronic obstructive pulmonary disease with (acute) exacerbation: Secondary | ICD-10-CM | POA: Diagnosis not present

## 2021-08-21 DIAGNOSIS — R269 Unspecified abnormalities of gait and mobility: Secondary | ICD-10-CM | POA: Diagnosis not present

## 2021-09-10 DIAGNOSIS — R269 Unspecified abnormalities of gait and mobility: Secondary | ICD-10-CM | POA: Diagnosis not present

## 2021-09-10 DIAGNOSIS — I279 Pulmonary heart disease, unspecified: Secondary | ICD-10-CM | POA: Diagnosis not present

## 2021-09-10 DIAGNOSIS — J441 Chronic obstructive pulmonary disease with (acute) exacerbation: Secondary | ICD-10-CM | POA: Diagnosis not present

## 2021-09-20 DIAGNOSIS — R269 Unspecified abnormalities of gait and mobility: Secondary | ICD-10-CM | POA: Diagnosis not present

## 2021-09-20 DIAGNOSIS — I279 Pulmonary heart disease, unspecified: Secondary | ICD-10-CM | POA: Diagnosis not present

## 2021-09-20 DIAGNOSIS — J441 Chronic obstructive pulmonary disease with (acute) exacerbation: Secondary | ICD-10-CM | POA: Diagnosis not present

## 2021-09-21 DIAGNOSIS — G894 Chronic pain syndrome: Secondary | ICD-10-CM | POA: Diagnosis not present

## 2021-09-21 DIAGNOSIS — G8921 Chronic pain due to trauma: Secondary | ICD-10-CM | POA: Diagnosis not present

## 2021-09-21 DIAGNOSIS — M25551 Pain in right hip: Secondary | ICD-10-CM | POA: Diagnosis not present

## 2021-09-21 DIAGNOSIS — Z79891 Long term (current) use of opiate analgesic: Secondary | ICD-10-CM | POA: Diagnosis not present

## 2021-09-21 DIAGNOSIS — Z79899 Other long term (current) drug therapy: Secondary | ICD-10-CM | POA: Diagnosis not present

## 2021-09-21 DIAGNOSIS — G8922 Chronic post-thoracotomy pain: Secondary | ICD-10-CM | POA: Diagnosis not present

## 2021-09-21 DIAGNOSIS — Z5181 Encounter for therapeutic drug level monitoring: Secondary | ICD-10-CM | POA: Diagnosis not present

## 2021-09-21 DIAGNOSIS — M25561 Pain in right knee: Secondary | ICD-10-CM | POA: Diagnosis not present

## 2021-10-11 DIAGNOSIS — J441 Chronic obstructive pulmonary disease with (acute) exacerbation: Secondary | ICD-10-CM | POA: Diagnosis not present

## 2021-10-11 DIAGNOSIS — R269 Unspecified abnormalities of gait and mobility: Secondary | ICD-10-CM | POA: Diagnosis not present

## 2021-10-11 DIAGNOSIS — I279 Pulmonary heart disease, unspecified: Secondary | ICD-10-CM | POA: Diagnosis not present

## 2021-10-21 DIAGNOSIS — J441 Chronic obstructive pulmonary disease with (acute) exacerbation: Secondary | ICD-10-CM | POA: Diagnosis not present

## 2021-10-21 DIAGNOSIS — R269 Unspecified abnormalities of gait and mobility: Secondary | ICD-10-CM | POA: Diagnosis not present

## 2021-10-21 DIAGNOSIS — I279 Pulmonary heart disease, unspecified: Secondary | ICD-10-CM | POA: Diagnosis not present

## 2021-10-26 DIAGNOSIS — I279 Pulmonary heart disease, unspecified: Secondary | ICD-10-CM | POA: Diagnosis not present

## 2021-10-26 DIAGNOSIS — J441 Chronic obstructive pulmonary disease with (acute) exacerbation: Secondary | ICD-10-CM | POA: Diagnosis not present

## 2021-10-26 DIAGNOSIS — R269 Unspecified abnormalities of gait and mobility: Secondary | ICD-10-CM | POA: Diagnosis not present

## 2021-11-11 DIAGNOSIS — J441 Chronic obstructive pulmonary disease with (acute) exacerbation: Secondary | ICD-10-CM | POA: Diagnosis not present

## 2021-11-11 DIAGNOSIS — R269 Unspecified abnormalities of gait and mobility: Secondary | ICD-10-CM | POA: Diagnosis not present

## 2021-11-11 DIAGNOSIS — I279 Pulmonary heart disease, unspecified: Secondary | ICD-10-CM | POA: Diagnosis not present

## 2021-11-16 DIAGNOSIS — G894 Chronic pain syndrome: Secondary | ICD-10-CM | POA: Diagnosis not present

## 2021-11-16 DIAGNOSIS — G8922 Chronic post-thoracotomy pain: Secondary | ICD-10-CM | POA: Diagnosis not present

## 2021-11-16 DIAGNOSIS — Z5181 Encounter for therapeutic drug level monitoring: Secondary | ICD-10-CM | POA: Diagnosis not present

## 2021-11-16 DIAGNOSIS — G8921 Chronic pain due to trauma: Secondary | ICD-10-CM | POA: Diagnosis not present

## 2021-11-21 DIAGNOSIS — J441 Chronic obstructive pulmonary disease with (acute) exacerbation: Secondary | ICD-10-CM | POA: Diagnosis not present

## 2021-11-21 DIAGNOSIS — I279 Pulmonary heart disease, unspecified: Secondary | ICD-10-CM | POA: Diagnosis not present

## 2021-11-21 DIAGNOSIS — R269 Unspecified abnormalities of gait and mobility: Secondary | ICD-10-CM | POA: Diagnosis not present

## 2021-12-07 DIAGNOSIS — Z5181 Encounter for therapeutic drug level monitoring: Secondary | ICD-10-CM | POA: Diagnosis not present

## 2021-12-07 DIAGNOSIS — G8921 Chronic pain due to trauma: Secondary | ICD-10-CM | POA: Diagnosis not present

## 2021-12-07 DIAGNOSIS — G894 Chronic pain syndrome: Secondary | ICD-10-CM | POA: Diagnosis not present

## 2021-12-07 DIAGNOSIS — M25551 Pain in right hip: Secondary | ICD-10-CM | POA: Diagnosis not present

## 2021-12-07 DIAGNOSIS — Z79891 Long term (current) use of opiate analgesic: Secondary | ICD-10-CM | POA: Diagnosis not present

## 2021-12-07 DIAGNOSIS — Z79899 Other long term (current) drug therapy: Secondary | ICD-10-CM | POA: Diagnosis not present

## 2021-12-07 DIAGNOSIS — G8922 Chronic post-thoracotomy pain: Secondary | ICD-10-CM | POA: Diagnosis not present

## 2021-12-07 DIAGNOSIS — M25561 Pain in right knee: Secondary | ICD-10-CM | POA: Diagnosis not present

## 2021-12-07 DIAGNOSIS — G8929 Other chronic pain: Secondary | ICD-10-CM | POA: Diagnosis not present

## 2021-12-11 DIAGNOSIS — I279 Pulmonary heart disease, unspecified: Secondary | ICD-10-CM | POA: Diagnosis not present

## 2021-12-11 DIAGNOSIS — R269 Unspecified abnormalities of gait and mobility: Secondary | ICD-10-CM | POA: Diagnosis not present

## 2021-12-11 DIAGNOSIS — J441 Chronic obstructive pulmonary disease with (acute) exacerbation: Secondary | ICD-10-CM | POA: Diagnosis not present

## 2021-12-21 DIAGNOSIS — J441 Chronic obstructive pulmonary disease with (acute) exacerbation: Secondary | ICD-10-CM | POA: Diagnosis not present

## 2021-12-21 DIAGNOSIS — R269 Unspecified abnormalities of gait and mobility: Secondary | ICD-10-CM | POA: Diagnosis not present

## 2021-12-21 DIAGNOSIS — I279 Pulmonary heart disease, unspecified: Secondary | ICD-10-CM | POA: Diagnosis not present

## 2022-01-11 DIAGNOSIS — I279 Pulmonary heart disease, unspecified: Secondary | ICD-10-CM | POA: Diagnosis not present

## 2022-01-11 DIAGNOSIS — J441 Chronic obstructive pulmonary disease with (acute) exacerbation: Secondary | ICD-10-CM | POA: Diagnosis not present

## 2022-01-11 DIAGNOSIS — R269 Unspecified abnormalities of gait and mobility: Secondary | ICD-10-CM | POA: Diagnosis not present

## 2022-02-01 DIAGNOSIS — Z5181 Encounter for therapeutic drug level monitoring: Secondary | ICD-10-CM | POA: Diagnosis not present

## 2022-02-10 DIAGNOSIS — R269 Unspecified abnormalities of gait and mobility: Secondary | ICD-10-CM | POA: Diagnosis not present

## 2022-02-10 DIAGNOSIS — J441 Chronic obstructive pulmonary disease with (acute) exacerbation: Secondary | ICD-10-CM | POA: Diagnosis not present

## 2022-02-10 DIAGNOSIS — I279 Pulmonary heart disease, unspecified: Secondary | ICD-10-CM | POA: Diagnosis not present

## 2022-02-15 DIAGNOSIS — Z6821 Body mass index (BMI) 21.0-21.9, adult: Secondary | ICD-10-CM | POA: Diagnosis not present

## 2022-02-15 DIAGNOSIS — J449 Chronic obstructive pulmonary disease, unspecified: Secondary | ICD-10-CM | POA: Diagnosis not present

## 2022-02-15 DIAGNOSIS — I700 Atherosclerosis of aorta: Secondary | ICD-10-CM | POA: Diagnosis not present

## 2022-02-15 DIAGNOSIS — Z125 Encounter for screening for malignant neoplasm of prostate: Secondary | ICD-10-CM | POA: Diagnosis not present

## 2022-02-15 DIAGNOSIS — K2100 Gastro-esophageal reflux disease with esophagitis, without bleeding: Secondary | ICD-10-CM | POA: Diagnosis not present

## 2022-02-15 DIAGNOSIS — G6289 Other specified polyneuropathies: Secondary | ICD-10-CM | POA: Diagnosis not present

## 2022-02-17 ENCOUNTER — Other Ambulatory Visit (HOSPITAL_COMMUNITY): Payer: Self-pay | Admitting: Internal Medicine

## 2022-02-17 DIAGNOSIS — F17210 Nicotine dependence, cigarettes, uncomplicated: Secondary | ICD-10-CM

## 2022-03-13 DIAGNOSIS — I279 Pulmonary heart disease, unspecified: Secondary | ICD-10-CM | POA: Diagnosis not present

## 2022-03-13 DIAGNOSIS — J441 Chronic obstructive pulmonary disease with (acute) exacerbation: Secondary | ICD-10-CM | POA: Diagnosis not present

## 2022-03-13 DIAGNOSIS — R269 Unspecified abnormalities of gait and mobility: Secondary | ICD-10-CM | POA: Diagnosis not present

## 2022-03-29 DIAGNOSIS — Z5181 Encounter for therapeutic drug level monitoring: Secondary | ICD-10-CM | POA: Diagnosis not present

## 2022-03-31 ENCOUNTER — Encounter (HOSPITAL_COMMUNITY): Payer: Self-pay

## 2022-03-31 ENCOUNTER — Ambulatory Visit (HOSPITAL_COMMUNITY): Payer: Medicare HMO

## 2022-04-10 ENCOUNTER — Ambulatory Visit (HOSPITAL_COMMUNITY)
Admission: RE | Admit: 2022-04-10 | Discharge: 2022-04-10 | Disposition: A | Discharge status: Home / Self Care | Payer: Medicare HMO | Source: Ambulatory Visit | Attending: Internal Medicine | Admitting: Internal Medicine

## 2022-04-10 DIAGNOSIS — F17210 Nicotine dependence, cigarettes, uncomplicated: Secondary | ICD-10-CM

## 2022-04-13 DIAGNOSIS — I279 Pulmonary heart disease, unspecified: Secondary | ICD-10-CM | POA: Diagnosis not present

## 2022-04-13 DIAGNOSIS — R269 Unspecified abnormalities of gait and mobility: Secondary | ICD-10-CM | POA: Diagnosis not present

## 2022-04-13 DIAGNOSIS — J441 Chronic obstructive pulmonary disease with (acute) exacerbation: Secondary | ICD-10-CM | POA: Diagnosis not present

## 2022-05-12 DIAGNOSIS — R269 Unspecified abnormalities of gait and mobility: Secondary | ICD-10-CM | POA: Diagnosis not present

## 2022-05-12 DIAGNOSIS — I279 Pulmonary heart disease, unspecified: Secondary | ICD-10-CM | POA: Diagnosis not present

## 2022-05-12 DIAGNOSIS — J441 Chronic obstructive pulmonary disease with (acute) exacerbation: Secondary | ICD-10-CM | POA: Diagnosis not present

## 2022-05-24 DIAGNOSIS — Z5181 Encounter for therapeutic drug level monitoring: Secondary | ICD-10-CM | POA: Diagnosis not present

## 2022-05-29 DIAGNOSIS — G6289 Other specified polyneuropathies: Secondary | ICD-10-CM | POA: Diagnosis not present

## 2022-05-29 DIAGNOSIS — K2100 Gastro-esophageal reflux disease with esophagitis, without bleeding: Secondary | ICD-10-CM | POA: Diagnosis not present

## 2022-05-29 DIAGNOSIS — Z0000 Encounter for general adult medical examination without abnormal findings: Secondary | ICD-10-CM | POA: Diagnosis not present

## 2022-05-29 DIAGNOSIS — J449 Chronic obstructive pulmonary disease, unspecified: Secondary | ICD-10-CM | POA: Diagnosis not present

## 2022-05-29 DIAGNOSIS — I700 Atherosclerosis of aorta: Secondary | ICD-10-CM | POA: Diagnosis not present

## 2022-05-29 DIAGNOSIS — R630 Anorexia: Secondary | ICD-10-CM | POA: Diagnosis not present

## 2022-05-29 DIAGNOSIS — E1169 Type 2 diabetes mellitus with other specified complication: Secondary | ICD-10-CM | POA: Diagnosis not present

## 2022-05-29 DIAGNOSIS — Z6820 Body mass index (BMI) 20.0-20.9, adult: Secondary | ICD-10-CM | POA: Diagnosis not present

## 2022-06-12 DIAGNOSIS — I279 Pulmonary heart disease, unspecified: Secondary | ICD-10-CM | POA: Diagnosis not present

## 2022-06-12 DIAGNOSIS — J441 Chronic obstructive pulmonary disease with (acute) exacerbation: Secondary | ICD-10-CM | POA: Diagnosis not present

## 2022-06-12 DIAGNOSIS — R269 Unspecified abnormalities of gait and mobility: Secondary | ICD-10-CM | POA: Diagnosis not present

## 2022-07-03 DIAGNOSIS — Z5181 Encounter for therapeutic drug level monitoring: Secondary | ICD-10-CM | POA: Diagnosis not present

## 2022-07-12 DIAGNOSIS — R269 Unspecified abnormalities of gait and mobility: Secondary | ICD-10-CM | POA: Diagnosis not present

## 2022-07-12 DIAGNOSIS — J441 Chronic obstructive pulmonary disease with (acute) exacerbation: Secondary | ICD-10-CM | POA: Diagnosis not present

## 2022-07-12 DIAGNOSIS — I279 Pulmonary heart disease, unspecified: Secondary | ICD-10-CM | POA: Diagnosis not present

## 2022-07-18 DIAGNOSIS — Z5181 Encounter for therapeutic drug level monitoring: Secondary | ICD-10-CM | POA: Diagnosis not present

## 2022-08-12 DIAGNOSIS — R269 Unspecified abnormalities of gait and mobility: Secondary | ICD-10-CM | POA: Diagnosis not present

## 2022-08-12 DIAGNOSIS — J441 Chronic obstructive pulmonary disease with (acute) exacerbation: Secondary | ICD-10-CM | POA: Diagnosis not present

## 2022-08-12 DIAGNOSIS — I279 Pulmonary heart disease, unspecified: Secondary | ICD-10-CM | POA: Diagnosis not present

## 2022-09-11 DIAGNOSIS — J441 Chronic obstructive pulmonary disease with (acute) exacerbation: Secondary | ICD-10-CM | POA: Diagnosis not present

## 2022-09-11 DIAGNOSIS — R269 Unspecified abnormalities of gait and mobility: Secondary | ICD-10-CM | POA: Diagnosis not present

## 2022-09-11 DIAGNOSIS — I279 Pulmonary heart disease, unspecified: Secondary | ICD-10-CM | POA: Diagnosis not present

## 2022-09-12 DIAGNOSIS — G8929 Other chronic pain: Secondary | ICD-10-CM | POA: Diagnosis not present

## 2022-09-12 DIAGNOSIS — Z79899 Other long term (current) drug therapy: Secondary | ICD-10-CM | POA: Diagnosis not present

## 2022-09-12 DIAGNOSIS — M25551 Pain in right hip: Secondary | ICD-10-CM | POA: Diagnosis not present

## 2022-09-12 DIAGNOSIS — Z5181 Encounter for therapeutic drug level monitoring: Secondary | ICD-10-CM | POA: Diagnosis not present

## 2022-09-12 DIAGNOSIS — Z79891 Long term (current) use of opiate analgesic: Secondary | ICD-10-CM | POA: Diagnosis not present

## 2022-09-12 DIAGNOSIS — M25561 Pain in right knee: Secondary | ICD-10-CM | POA: Diagnosis not present

## 2022-09-12 DIAGNOSIS — G894 Chronic pain syndrome: Secondary | ICD-10-CM | POA: Diagnosis not present

## 2022-09-12 DIAGNOSIS — G8921 Chronic pain due to trauma: Secondary | ICD-10-CM | POA: Diagnosis not present

## 2022-09-12 DIAGNOSIS — G8922 Chronic post-thoracotomy pain: Secondary | ICD-10-CM | POA: Diagnosis not present

## 2022-10-12 DIAGNOSIS — I279 Pulmonary heart disease, unspecified: Secondary | ICD-10-CM | POA: Diagnosis not present

## 2022-10-12 DIAGNOSIS — J441 Chronic obstructive pulmonary disease with (acute) exacerbation: Secondary | ICD-10-CM | POA: Diagnosis not present

## 2022-10-12 DIAGNOSIS — R269 Unspecified abnormalities of gait and mobility: Secondary | ICD-10-CM | POA: Diagnosis not present

## 2022-11-08 DIAGNOSIS — G8921 Chronic pain due to trauma: Secondary | ICD-10-CM | POA: Diagnosis not present

## 2022-11-08 DIAGNOSIS — Z79891 Long term (current) use of opiate analgesic: Secondary | ICD-10-CM | POA: Diagnosis not present

## 2022-11-08 DIAGNOSIS — G8929 Other chronic pain: Secondary | ICD-10-CM | POA: Diagnosis not present

## 2022-11-08 DIAGNOSIS — M25561 Pain in right knee: Secondary | ICD-10-CM | POA: Diagnosis not present

## 2022-11-08 DIAGNOSIS — M25551 Pain in right hip: Secondary | ICD-10-CM | POA: Diagnosis not present

## 2022-11-08 DIAGNOSIS — G894 Chronic pain syndrome: Secondary | ICD-10-CM | POA: Diagnosis not present

## 2022-11-08 DIAGNOSIS — G8922 Chronic post-thoracotomy pain: Secondary | ICD-10-CM | POA: Diagnosis not present

## 2022-11-08 DIAGNOSIS — Z5181 Encounter for therapeutic drug level monitoring: Secondary | ICD-10-CM | POA: Diagnosis not present

## 2022-11-08 DIAGNOSIS — Z79899 Other long term (current) drug therapy: Secondary | ICD-10-CM | POA: Diagnosis not present

## 2022-11-12 DIAGNOSIS — R269 Unspecified abnormalities of gait and mobility: Secondary | ICD-10-CM | POA: Diagnosis not present

## 2022-11-12 DIAGNOSIS — J441 Chronic obstructive pulmonary disease with (acute) exacerbation: Secondary | ICD-10-CM | POA: Diagnosis not present

## 2022-11-12 DIAGNOSIS — I279 Pulmonary heart disease, unspecified: Secondary | ICD-10-CM | POA: Diagnosis not present

## 2022-12-02 ENCOUNTER — Inpatient Hospital Stay (HOSPITAL_COMMUNITY)
Admission: EM | Admit: 2022-12-02 | Discharge: 2022-12-12 | DRG: 189 | Disposition: A | Discharge status: Hospice | Payer: Medicare HMO | Attending: Internal Medicine | Admitting: Internal Medicine

## 2022-12-02 ENCOUNTER — Encounter (HOSPITAL_COMMUNITY): Payer: Self-pay | Admitting: Emergency Medicine

## 2022-12-02 ENCOUNTER — Emergency Department (HOSPITAL_COMMUNITY): Payer: Medicare HMO

## 2022-12-02 ENCOUNTER — Other Ambulatory Visit: Payer: Self-pay

## 2022-12-02 DIAGNOSIS — I5031 Acute diastolic (congestive) heart failure: Secondary | ICD-10-CM | POA: Diagnosis not present

## 2022-12-02 DIAGNOSIS — R54 Age-related physical debility: Secondary | ICD-10-CM | POA: Diagnosis not present

## 2022-12-02 DIAGNOSIS — Z515 Encounter for palliative care: Secondary | ICD-10-CM | POA: Diagnosis not present

## 2022-12-02 DIAGNOSIS — H5462 Unqualified visual loss, left eye, normal vision right eye: Secondary | ICD-10-CM | POA: Diagnosis not present

## 2022-12-02 DIAGNOSIS — F1120 Opioid dependence, uncomplicated: Secondary | ICD-10-CM | POA: Diagnosis present

## 2022-12-02 DIAGNOSIS — H547 Unspecified visual loss: Secondary | ICD-10-CM

## 2022-12-02 DIAGNOSIS — Z888 Allergy status to other drugs, medicaments and biological substances status: Secondary | ICD-10-CM

## 2022-12-02 DIAGNOSIS — Z79899 Other long term (current) drug therapy: Secondary | ICD-10-CM

## 2022-12-02 DIAGNOSIS — I959 Hypotension, unspecified: Secondary | ICD-10-CM | POA: Diagnosis not present

## 2022-12-02 DIAGNOSIS — R918 Other nonspecific abnormal finding of lung field: Secondary | ICD-10-CM | POA: Diagnosis not present

## 2022-12-02 DIAGNOSIS — Z9981 Dependence on supplemental oxygen: Secondary | ICD-10-CM

## 2022-12-02 DIAGNOSIS — J9601 Acute respiratory failure with hypoxia: Principal | ICD-10-CM

## 2022-12-02 DIAGNOSIS — M810 Age-related osteoporosis without current pathological fracture: Secondary | ICD-10-CM | POA: Diagnosis not present

## 2022-12-02 DIAGNOSIS — J9602 Acute respiratory failure with hypercapnia: Principal | ICD-10-CM

## 2022-12-02 DIAGNOSIS — Z7951 Long term (current) use of inhaled steroids: Secondary | ICD-10-CM

## 2022-12-02 DIAGNOSIS — F17290 Nicotine dependence, other tobacco product, uncomplicated: Secondary | ICD-10-CM | POA: Diagnosis not present

## 2022-12-02 DIAGNOSIS — R410 Disorientation, unspecified: Secondary | ICD-10-CM | POA: Diagnosis not present

## 2022-12-02 DIAGNOSIS — I279 Pulmonary heart disease, unspecified: Secondary | ICD-10-CM | POA: Diagnosis not present

## 2022-12-02 DIAGNOSIS — Z681 Body mass index (BMI) 19 or less, adult: Secondary | ICD-10-CM | POA: Diagnosis not present

## 2022-12-02 DIAGNOSIS — G629 Polyneuropathy, unspecified: Secondary | ICD-10-CM | POA: Diagnosis present

## 2022-12-02 DIAGNOSIS — R000 Tachycardia, unspecified: Secondary | ICD-10-CM | POA: Diagnosis not present

## 2022-12-02 DIAGNOSIS — F17210 Nicotine dependence, cigarettes, uncomplicated: Secondary | ICD-10-CM | POA: Diagnosis present

## 2022-12-02 DIAGNOSIS — Z66 Do not resuscitate: Secondary | ICD-10-CM | POA: Diagnosis not present

## 2022-12-02 DIAGNOSIS — J9691 Respiratory failure, unspecified with hypoxia: Secondary | ICD-10-CM | POA: Diagnosis present

## 2022-12-02 DIAGNOSIS — Z23 Encounter for immunization: Secondary | ICD-10-CM | POA: Diagnosis not present

## 2022-12-02 DIAGNOSIS — J9622 Acute and chronic respiratory failure with hypercapnia: Secondary | ICD-10-CM | POA: Diagnosis not present

## 2022-12-02 DIAGNOSIS — R0609 Other forms of dyspnea: Secondary | ICD-10-CM | POA: Diagnosis not present

## 2022-12-02 DIAGNOSIS — G9341 Metabolic encephalopathy: Secondary | ICD-10-CM

## 2022-12-02 DIAGNOSIS — E43 Unspecified severe protein-calorie malnutrition: Secondary | ICD-10-CM | POA: Diagnosis not present

## 2022-12-02 DIAGNOSIS — Z8249 Family history of ischemic heart disease and other diseases of the circulatory system: Secondary | ICD-10-CM

## 2022-12-02 DIAGNOSIS — E46 Unspecified protein-calorie malnutrition: Secondary | ICD-10-CM

## 2022-12-02 DIAGNOSIS — R269 Unspecified abnormalities of gait and mobility: Secondary | ICD-10-CM | POA: Diagnosis not present

## 2022-12-02 DIAGNOSIS — R64 Cachexia: Secondary | ICD-10-CM | POA: Diagnosis present

## 2022-12-02 DIAGNOSIS — J9621 Acute and chronic respiratory failure with hypoxia: Secondary | ICD-10-CM | POA: Diagnosis not present

## 2022-12-02 DIAGNOSIS — Z79891 Long term (current) use of opiate analgesic: Secondary | ICD-10-CM

## 2022-12-02 DIAGNOSIS — Z96641 Presence of right artificial hip joint: Secondary | ICD-10-CM | POA: Diagnosis present

## 2022-12-02 DIAGNOSIS — J439 Emphysema, unspecified: Secondary | ICD-10-CM | POA: Diagnosis not present

## 2022-12-02 DIAGNOSIS — Z7982 Long term (current) use of aspirin: Secondary | ICD-10-CM

## 2022-12-02 DIAGNOSIS — E876 Hypokalemia: Secondary | ICD-10-CM | POA: Diagnosis not present

## 2022-12-02 DIAGNOSIS — Z7189 Other specified counseling: Secondary | ICD-10-CM | POA: Diagnosis not present

## 2022-12-02 DIAGNOSIS — J9690 Respiratory failure, unspecified, unspecified whether with hypoxia or hypercapnia: Secondary | ICD-10-CM | POA: Diagnosis not present

## 2022-12-02 DIAGNOSIS — Z77090 Contact with and (suspected) exposure to asbestos: Secondary | ICD-10-CM | POA: Diagnosis present

## 2022-12-02 DIAGNOSIS — R531 Weakness: Secondary | ICD-10-CM | POA: Diagnosis not present

## 2022-12-02 DIAGNOSIS — Z96643 Presence of artificial hip joint, bilateral: Secondary | ICD-10-CM | POA: Diagnosis not present

## 2022-12-02 DIAGNOSIS — G894 Chronic pain syndrome: Secondary | ICD-10-CM | POA: Diagnosis present

## 2022-12-02 DIAGNOSIS — J441 Chronic obstructive pulmonary disease with (acute) exacerbation: Secondary | ICD-10-CM | POA: Diagnosis not present

## 2022-12-02 DIAGNOSIS — J449 Chronic obstructive pulmonary disease, unspecified: Secondary | ICD-10-CM

## 2022-12-02 DIAGNOSIS — R0602 Shortness of breath: Secondary | ICD-10-CM | POA: Diagnosis not present

## 2022-12-02 DIAGNOSIS — Z833 Family history of diabetes mellitus: Secondary | ICD-10-CM

## 2022-12-02 LAB — COMPREHENSIVE METABOLIC PANEL
ALT: 16 U/L (ref 0–44)
AST: 16 U/L (ref 15–41)
Albumin: 4.2 g/dL (ref 3.5–5.0)
Alkaline Phosphatase: 73 U/L (ref 38–126)
BUN: 14 mg/dL (ref 6–20)
CO2: >45 mmol/L — ABNORMAL HIGH (ref 22–32)
Calcium: 9.5 mg/dL (ref 8.9–10.3)
Chloride: 82 mmol/L — ABNORMAL LOW (ref 98–111)
Creatinine, Ser: 0.35 mg/dL — ABNORMAL LOW (ref 0.61–1.24)
GFR, Estimated: >60 mL/min (ref >60)
Glucose, Bld: 105 mg/dL — ABNORMAL HIGH (ref 70–99)
Potassium: 3.4 mmol/L — ABNORMAL LOW (ref 3.5–5.1)
Sodium: 140 mmol/L (ref 135–145)
Total Bilirubin: 0.2 mg/dL — ABNORMAL LOW (ref 0.3–1.2)
Total Protein: 7.7 g/dL (ref 6.5–8.1)

## 2022-12-02 LAB — CBG MONITORING, ED: Glucose-Capillary: 100 mg/dL — ABNORMAL HIGH (ref 70–99)

## 2022-12-02 LAB — CBC
HCT: 38.4 % — ABNORMAL LOW (ref 39.0–52.0)
Hemoglobin: 11 g/dL — ABNORMAL LOW (ref 13.0–17.0)
MCH: 30.6 pg (ref 26.0–34.0)
MCHC: 28.6 g/dL — ABNORMAL LOW (ref 30.0–36.0)
MCV: 107 fL — ABNORMAL HIGH (ref 80.0–100.0)
Platelets: 195 10*3/uL (ref 150–400)
RBC: 3.59 MIL/uL — ABNORMAL LOW (ref 4.22–5.81)
RDW: 11.5 % (ref 11.5–15.5)
WBC: 11.5 10*3/uL — ABNORMAL HIGH (ref 4.0–10.5)
nRBC: 0 % (ref 0.0–0.2)

## 2022-12-02 LAB — BLOOD GAS, VENOUS
Acid-Base Excess: 31 mmol/L — ABNORMAL HIGH (ref 0.0–2.0)
Bicarbonate: 66.6 mmol/L — ABNORMAL HIGH (ref 20.0–28.0)
Drawn by: 66297
O2 Saturation: 71.2 %
Patient temperature: 37.1
pCO2, Ven: >123 mm[Hg] (ref 44–60)
pH, Ven: 7.27 (ref 7.25–7.43)
pO2, Ven: 38 mm[Hg] (ref 32–45)

## 2022-12-02 LAB — BRAIN NATRIURETIC PEPTIDE: B Natriuretic Peptide: 20 pg/mL (ref 0.0–100.0)

## 2022-12-02 LAB — AMMONIA: Ammonia: 24 umol/L (ref 9–35)

## 2022-12-02 MED ORDER — MORPHINE SULFATE (PF) 2 MG/ML IV SOLN
2.0000 mg | INTRAVENOUS | Status: DC | PRN
  Administered 2022-12-02 – 2022-12-03 (×2): 2 mg via INTRAVENOUS
  Filled 2022-12-02 (×2): qty 1

## 2022-12-02 MED ORDER — PNEUMOCOCCAL 20-VAL CONJ VACC 0.5 ML IM SUSY
0.5000 mL | PREFILLED_SYRINGE | INTRAMUSCULAR | Status: AC
  Administered 2022-12-03: 0.5 mL via INTRAMUSCULAR
  Filled 2022-12-02: qty 0.5

## 2022-12-02 MED ORDER — IPRATROPIUM-ALBUTEROL 0.5-2.5 (3) MG/3ML IN SOLN
3.0000 mL | Freq: Once | RESPIRATORY_TRACT | Status: AC
  Administered 2022-12-02: 3 mL via RESPIRATORY_TRACT
  Filled 2022-12-02: qty 3

## 2022-12-02 MED ORDER — METHYLPREDNISOLONE SODIUM SUCC 125 MG IJ SOLR
125.0000 mg | Freq: Once | INTRAMUSCULAR | Status: AC
  Administered 2022-12-02: 125 mg via INTRAVENOUS
  Filled 2022-12-02 (×2): qty 2

## 2022-12-02 MED ORDER — INFLUENZA VIRUS VACC SPLIT PF (FLUZONE) 0.5 ML IM SUSY
0.5000 mL | PREFILLED_SYRINGE | INTRAMUSCULAR | Status: AC
  Administered 2022-12-03: 0.5 mL via INTRAMUSCULAR
  Filled 2022-12-02: qty 0.5

## 2022-12-02 NOTE — ED Notes (Signed)
Date and time results received: 12/02/22 2103  (use smartphrase ".now" to insert current time)  Test: pco2 vbg   Critical Value: >123  Name of Provider Notified: Elayne Snare  Orders Received? Or Actions Taken?:  n/a

## 2022-12-02 NOTE — Assessment & Plan Note (Signed)
Clinically with moderate to severe malnutrition Will consult nutrition for recommendations.

## 2022-12-02 NOTE — Progress Notes (Signed)
Patient placed on Bipap at 16/6, BUR 18, 40%.  PaCO2 was 123 on venous gas.  Duoneb treatment given via aerogen.  Laid patient down so he could get on his side to sleep.  Patient complaining of hip pain at this time.   Will continue to monitor patient and Vt on current settings.

## 2022-12-02 NOTE — Assessment & Plan Note (Signed)
Add Kcl IV 40 meq Follow up renal function and electrolytes in am.  Patient NPO due to respiratory failure.

## 2022-12-02 NOTE — ED Notes (Signed)
Pt on 7 L of oxygen via [place], states he turned his home oxygen up to 10 L [place] on his own due to increasing sob. Bilateral lower extremity pitting edema

## 2022-12-02 NOTE — Assessment & Plan Note (Signed)
Continue neuro checks per unit protocol. Keep patient NPO for now Continue non invasive mechanical ventilation. Hold on opiate analgesics for now.

## 2022-12-02 NOTE — H&P (Addendum)
History and Physical    Patient: Wesley [name]:[id number] DOB: 1965/12/15 DOA: 12/02/2022 DOS: the patient was seen and examined on 12/02/2022 PCP: Toma Deiters, MD  Patient coming from: Home  Chief Complaint:  Chief Complaint  Patient presents with   Weakness   HPI: Wesley [name] is a 57 y.o. male with medical history significant of COPD with chronic hypoxia, on 10 L/min per [place] at home, chronic pain syndrome, neuropathy, and osteoporosis who presented with worsening dyspnea and confusion.   Most information from his wife, at the bedside, patient can not give detail history due to confusion and being on full face mask, bipap.  For the last 5 days patient has been experiencing worsening dyspnea, wheezing and productive cough. His wife noted him to be more confused and somnolent. Very poor oral intake, very weak and deconditioned.  Because worsening symptoms she brought him to the ED.   Patient has advanced COPD, with chronic hypoxia, he has high oxygen requirements at baseline. He has been not ambulatory for many years. He continues to smoke cigarettes.   In the Ed he was found in acute hypercapnic respiratory failure and was placed on Bipap.    Review of Systems: unable to review all systems due to the inability of the patient to answer questions. 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, 2017   Past Surgical History:  Procedure Laterality Date   BACK SURGERY     EYE SURGERY     left eye cornea and lens replaced- blind in left eye   JOINT REPLACEMENT     LEFT HEART CATH AND CORONARY ANGIOGRAPHY N/A 07/20/2016   Procedure: Left Heart Cath and Coronary Angiography;   Surgeon: Runell Gess, MD;  Location: [facility] INVASIVE CV LAB;  Service: Cardiovascular;  Laterality: N/A;   LUNG SURGERY     TOTAL HIP ARTHROPLASTY Right 09/24/2015   Procedure: TOTAL HIP ARTHROPLASTY ANTERIOR APPROACH;  Surgeon: Ollen Gross, MD;  Location: [facility] ORS;  Service: Orthopedics;  Laterality: Right;   Social History:  reports that he has been smoking e-cigarettes and cigarettes. He has never used smokeless tobacco. He reports that he does not drink alcohol and does not use drugs.  Allergies  Allergen Reactions   Ambien [Zolpidem Tartrate] Other (See Comments)    hallucinations   Melatonin Itching   Zolpidem Other (See Comments)    Altered mental status    Family History  Problem Relation Age of Onset   Heart failure Father    Diabetes Father    Diabetes Mother    Cancer Mother        male cancer    Prior to Admission medications   Medication Sig Start Date End Date Taking? Authorizing Provider  acetaminophen (TYLENOL) 325 MG tablet Take 2 tablets by mouth every 4 (four) hours as needed.     [provider]  ADVAIR DISKUS 250-50 MCG/DOSE AEPB Inhale 1 puff into the lungs 2 (two) times daily. 06/14/15   [provider]  albuterol (PROVENTIL) (2.5 MG/3ML) 0.083% nebulizer solution Take 3 mLs (2.5 mg  total) by nebulization every 6 (six) hours as needed for wheezing or shortness of breath. 07/18/19   Shon Hale, MD  albuterol (VENTOLIN HFA) 108 (90 Base) MCG/ACT inhaler Inhale 2 puffs into the lungs every 4 (four) hours as needed for wheezing or shortness of breath. Shortness of breath 07/18/19   Shon Hale, MD  amoxicillin-clavulanate (AUGMENTIN) 875-125 MG tablet Take 1 tablet by mouth 2 (two) times daily. 08/12/19   Coralyn Helling, MD  aspirin 81 MG chewable tablet Chew 1 tablet (81 mg total) by mouth daily with breakfast. 07/18/19   Shon Hale, MD  furosemide (LASIX) 40 MG tablet Take 1 tablet (40 mg total) by mouth 2 (two) times daily for 3  days. 08/12/19 08/15/19  Coralyn Helling, MD  guaiFENesin (MUCINEX) 600 MG 12 hr tablet Take 1 tablet (600 mg total) by mouth 2 (two) times daily. 07/18/19   Shon Hale, MD  guaiFENesin-dextromethorphan (ROBITUSSIN DM) 100-10 MG/5ML syrup Take 5 mLs by mouth every 4 (four) hours as needed for cough. 07/04/19   Erick Blinks, MD  INCRUSE ELLIPTA 62.5 MCG/INH AEPB Inhale 1 puff into the lungs daily. 07/18/19   Emokpae, Courage, MD  montelukast (SINGULAIR) 10 MG tablet Take 1 tablet by mouth daily. 07/07/16   [provider]  morphine (MS CONTIN) 30 MG 12 hr tablet Take 30 mg by mouth every 8 (eight) hours. 12/30/18   [provider]  mupirocin ointment (BACTROBAN) 2 % Place 1 application into the nose every other day.  12/03/18   [provider]  naloxone HCl (NARCAN) 4 MG/0.1ML LIQD Place 4 mg into the nose as directed. To prevent overdose    [provider]  nicotine (NICODERM CQ - DOSED IN MG/24 HOURS) 14 mg/24hr patch Place 1 patch (14 mg total) onto the skin daily. 07/19/19   Shon Hale, MD  ondansetron (ZOFRAN-ODT) 4 MG disintegrating tablet Take 1 tablet by mouth every 8 (eight) hours as needed for nausea or vomiting.  09/05/18   [provider]  oxycodone (ROXICODONE) 30 MG immediate release tablet Take 30 mg by mouth every 4 (four) hours.  06/12/19   [provider]  OXYGEN Inhale 4-5 L into the lungs continuous.    [provider]  Polyethyl Glycol-Propyl Glycol (SYSTANE) 0.4-0.3 % SOLN Place 1-2 drops into the left eye daily as needed (for dry eye relief).     [provider]  polyethylene glycol powder (GLYCOLAX/MIRALAX) 17 GM/SCOOP powder Take 17 g by mouth daily.    [provider]  predniSONE (DELTASONE) 10 MG tablet 4 pills daily for 3 days, 3 pills daily for 3 days, 2 pills daily for 3 days, 1 pill daily for 3 days. 08/12/19   Coralyn Helling, MD  pregabalin (LYRICA) 150 MG capsule Take 150 mg by mouth 3 (three)  times daily.  12/30/18   [provider]    Physical Exam: Vitals:   12/02/22 2045 12/02/22 2134 12/02/22 2137 12/02/22 2157  BP: 96/66 (!) 110/54    Pulse: 78 77    Resp: 17 16    Temp:   98.2 F (36.8 C)   TempSrc:   Oral   SpO2: 100% 100%  100%  Weight:      Height:       Neurology awake, mild confusion but not agitation. Deconditioned and ill looking appearing, no full face mask/ bipap  ENT with pallor Cardiovascular with S1 and S2 present and regular with no gallops, rubs or murmurs Respiratory with positive  accessory muscle use, very poor air movement, with prolonged expiratory phase, no wheezing or rhonchi Abdomen with no distention  Positive lower extremity pedal edema +++  Cold lower extremities.  No rashes Data Reviewed:   VBG 7,27/ >123/ 38/ 66/ 71%  Na 140, K 3.4 Cl 82, bicarbonate >45, glucose 105, bun 14 cr 0,35 AST 16. ALT 16  Wbc 11.5 hgb 11.0 plt 195   CT head with no acute intracranial abnormality.   Chest radiograph with left rotation, significant hyperinflation, with no infiltrates, or effusions.   EKG 94 bpm, normal axis, normal intervals, sinus rhythm with right atrial enlargement, with no significant ST segment or T wave changes.   Assessment and Plan: * Respiratory failure with hypoxia and hypercapnia (HCC) Acute on chronic respiratory failure. Advance emphysema.   Plan to admit to step down unit for non invasive mechanical ventilation.  Aggressive bronchodilator therapy. Inhaled and systemic corticosteroids.  Continue Bipap, currently 16/6 with Fi02 40%, generating tidal volumes 350 to 400 ml with minute ventilation about 7. Check VBG around 11:30 pm, may need further adjustments to bipap pressures.    Acute metabolic encephalopathy Continue neuro checks per unit protocol. Keep patient NPO for now Continue non invasive mechanical ventilation. Hold on opiate analgesics for now.   Opiate dependence (HCC) Chronic pain syndrome.   Patient on high doses of opiate analgesics.  Will hold for now his regular for of opiates, considering his acute encephalopathy and respiratory failure. May be able to re introduce lower dose as respiratory failure improves.  For now will add low dose IV morphine with close monitoring.  Continue with pregabalin for now.   Protein calorie malnutrition (HCC) Clinically with moderate to severe malnutrition Will consult nutrition for recommendations.   Hypokalemia Add Kcl IV 40 meq Follow up renal function and electrolytes in am.  Patient NPO due to respiratory failure.     Advance Care Planning: DNR  Consults: none   Family Communication: I spoke with patient's wife at the bedside, we talked in detail about patient's condition, plan of care and prognosis and all questions were addressed. We talked about patient's condition and prognosis in case of cardiac arrest he does not want attempt of resuscitation. She is not clear yet about invasive mechanical ventilation in case of worsening respiratory failure. This option still open at his point.    Severity of Illness: The appropriate patient status for this patient is INPATIENT. Inpatient status is judged to be reasonable and necessary in order to provide the required intensity of service to ensure the patient's safety. The patient's presenting symptoms, physical exam findings, and initial radiographic and laboratory data in the context of their chronic comorbidities is felt to place them at high risk for further clinical deterioration. Furthermore, it is not anticipated that the patient will be medically stable for discharge from the hospital within 2 midnights of admission.   * I certify that at the point of admission it is my clinical judgment that the patient will require inpatient hospital care spanning beyond 2 midnights from the point of admission due to high intensity of service, high risk for further deterioration and high frequency of  surveillance required.*  Author: Coralie Keens, MD 12/02/2022 10:08 PM  For on call review [email or web address].

## 2022-12-02 NOTE — ED Provider Notes (Signed)
[place] EMERGENCY DEPARTMENT AT [facility] Provider Note   CSN: [id number] Arrival date & time: 12/02/22  1642     History  Chief Complaint  Patient presents with   Weakness    Wesley [name] is a 57 y.o. male.  With a past medical history of COPD who presents to the ED for shortness of breath.  History obtained primarily from patient's wife at bedside.  Wife voices concern for worsening shortness of breath, increased generalized weakness and confusion over the last week.  She said this is an ongoing issue for the last 6 to 8 months but is really worsened over the last week.  She reports she has been turning patient's O2 nasal cannula up to 10 L at home and he is still short of breath.  The patient himself denies chest pain.  Recent falls with right hip pain.  No head trauma or LOC.  No fevers or chills.  Patient has not been having issues with tolerating p.o. intake but is eating much less overall.   Weakness      Home Medications Prior to Admission medications   Medication Sig Start Date End Date Taking? Authorizing Provider  acetaminophen (TYLENOL) 325 MG tablet Take 2 tablets by mouth every 4 (four) hours as needed.     [provider]  ADVAIR DISKUS 250-50 MCG/DOSE AEPB Inhale 1 puff into the lungs 2 (two) times daily. 06/14/15   [provider]  albuterol (PROVENTIL) (2.5 MG/3ML) 0.083% nebulizer solution Take 3 mLs (2.5 mg total) by nebulization every 6 (six) hours as needed for wheezing or shortness of breath. 07/18/19   Shon Hale, MD  albuterol (VENTOLIN HFA) 108 (90 Base) MCG/ACT inhaler Inhale 2 puffs into the lungs every 4 (four) hours as needed for wheezing or shortness of breath. Shortness of breath 07/18/19   Shon Hale, MD  amoxicillin-clavulanate (AUGMENTIN) 875-125 MG tablet Take 1 tablet by mouth 2 (two) times daily. 08/12/19   Coralyn Helling, MD  aspirin 81 MG chewable tablet Chew 1 tablet (81 mg total) by mouth daily with  breakfast. 07/18/19   Shon Hale, MD  furosemide (LASIX) 40 MG tablet Take 1 tablet (40 mg total) by mouth 2 (two) times daily for 3 days. 08/12/19 08/15/19  Coralyn Helling, MD  guaiFENesin (MUCINEX) 600 MG 12 hr tablet Take 1 tablet (600 mg total) by mouth 2 (two) times daily. 07/18/19   Shon Hale, MD  guaiFENesin-dextromethorphan (ROBITUSSIN DM) 100-10 MG/5ML syrup Take 5 mLs by mouth every 4 (four) hours as needed for cough. 07/04/19   Erick Blinks, MD  INCRUSE ELLIPTA 62.5 MCG/INH AEPB Inhale 1 puff into the lungs daily. 07/18/19   Emokpae, Courage, MD  montelukast (SINGULAIR) 10 MG tablet Take 1 tablet by mouth daily. 07/07/16   [provider]  morphine (MS CONTIN) 30 MG 12 hr tablet Take 30 mg by mouth every 8 (eight) hours. 12/30/18   [provider]  mupirocin ointment (BACTROBAN) 2 % Place 1 application into the nose every other day.  12/03/18   [provider]  naloxone HCl (NARCAN) 4 MG/0.1ML LIQD Place 4 mg into the nose as directed. To prevent overdose    [provider]  nicotine (NICODERM CQ - DOSED IN MG/24 HOURS) 14 mg/24hr patch Place 1 patch (14 mg total) onto the skin daily. 07/19/19   Shon Hale, MD  ondansetron (ZOFRAN-ODT) 4 MG disintegrating tablet Take 1 tablet by mouth every 8 (eight) hours as needed for nausea  or vomiting.  09/05/18   [provider]  oxycodone (ROXICODONE) 30 MG immediate release tablet Take 30 mg by mouth every 4 (four) hours.  06/12/19   [provider]  OXYGEN Inhale 4-5 L into the lungs continuous.    [provider]  Polyethyl Glycol-Propyl Glycol (SYSTANE) 0.4-0.3 % SOLN Place 1-2 drops into the left eye daily as needed (for dry eye relief).     [provider]  polyethylene glycol powder (GLYCOLAX/MIRALAX) 17 GM/SCOOP powder Take 17 g by mouth daily.    [provider]  predniSONE (DELTASONE) 10 MG tablet 4 pills daily for 3 days, 3 pills daily for 3 days, 2 pills  daily for 3 days, 1 pill daily for 3 days. 08/12/19   Coralyn Helling, MD  pregabalin (LYRICA) 150 MG capsule Take 150 mg by mouth 3 (three) times daily.  12/30/18   [provider]      Allergies    Ambien [zolpidem tartrate], Melatonin, and Zolpidem    Review of Systems   Review of Systems  Neurological:  Positive for weakness.    Physical Exam Updated Vital Signs BP (!) 110/54   Pulse 77   Temp 98.2 F (36.8 C) (Oral)   Resp 16   Ht 6\' 1"  (1.854 m)   Wt 64.9 kg   SpO2 100%   BMI 18.87 kg/m  Physical Exam Vitals and nursing note reviewed.  HENT:     Head: Normocephalic and atraumatic.  Eyes:     Pupils: Pupils are equal, round, and reactive to light.  Cardiovascular:     Rate and Rhythm: Normal rate and regular rhythm.  Pulmonary:     Effort: Pulmonary effort is normal.     Comments: Rhonchorous breath sounds throughout Barrel chested Abdominal:     Palpations: Abdomen is soft.     Tenderness: There is no abdominal tenderness.  Skin:    General: Skin is warm and dry.  Neurological:     Mental Status: He is alert.  Psychiatric:        Mood and Affect: Mood normal.     ED Results / Procedures / Treatments   Labs (all labs ordered are listed, but only abnormal results are displayed) Labs Reviewed  CBC - Abnormal; Notable for the following components:      Result Value   WBC 11.5 (*)    RBC 3.59 (*)    Hemoglobin 11.0 (*)    HCT 38.4 (*)    MCV 107.0 (*)    MCHC 28.6 (*)    All other components within normal limits  COMPREHENSIVE METABOLIC PANEL - Abnormal; Notable for the following components:   Potassium 3.4 (*)    Chloride 82 (*)    CO2 >45 (*)    Glucose, Bld 105 (*)    Creatinine, Ser 0.35 (*)    Total Bilirubin 0.2 (*)    All other components within normal limits  BLOOD GAS, VENOUS - Abnormal; Notable for the following components:   pCO2, Ven >123 (*)    Bicarbonate 66.6 (*)    Acid-Base Excess 31.0 (*)    All other components within  normal limits  CBG MONITORING, ED - Abnormal; Notable for the following components:   Glucose-Capillary 100 (*)    All other components within normal limits  CULTURE, BLOOD (ROUTINE X 2)  CULTURE, BLOOD (ROUTINE X 2)  AMMONIA  URINALYSIS, ROUTINE W REFLEX MICROSCOPIC  BRAIN NATRIURETIC PEPTIDE    EKG EKG Interpretation Date/Time:  Saturday December 02 2022 17:39:04 EDT Ventricular Rate:  94 PR Interval:  137 QRS Duration:  83 QT Interval:  353 QTC Calculation: 442 R Axis:   80  Text Interpretation: Sinus rhythm Right atrial enlargement Confirmed by Estelle June [phone]) on 12/02/2022 10:06:43 PM  Radiology CT Head Wo Contrast  Result Date: 12/02/2022 CLINICAL DATA:  Fall with confusion.  Question subdural hematoma. EXAM: CT HEAD WITHOUT CONTRAST TECHNIQUE: Contiguous axial images were obtained from the base of the skull through the vertex without intravenous contrast. RADIATION DOSE REDUCTION: This exam was performed according to the departmental dose-optimization program which includes automated exposure control, adjustment of the mA and/or kV according to patient size and/or use of iterative reconstruction technique. COMPARISON:  06/14/2019 FINDINGS: Brain: No evidence of acute infarction, hemorrhage, hydrocephalus, extra-axial collection or mass lesion/mass effect. Vascular: No hyperdense vessel or unexpected calcification. Skull: Normal. Negative for fracture or focal lesion. Sinuses/Orbits: No acute finding. Other: None. IMPRESSION: No acute intracranial abnormality. Electronically Signed   By: Kennith Center M.D.   On: 12/02/2022 19:11   DG Hip [place] or [place] Pelvis 1 View Right  Result Date: 12/02/2022 CLINICAL DATA:  Weakness.  Status post fall. EXAM: DG HIP (WITH OR WITHOUT PELVIS) 1V PORT RIGHT COMPARISON:  10/15/2015 FINDINGS: Bones are diffusely demineralized. Prominent stool volume noted in the visualized segments of the colon. Patient is status post bilateral hip  replacement. AP and frog-leg lateral views of the right hip show no evidence for periprosthetic fracture. No evidence for dislocation. IMPRESSION: 1. Status post bilateral hip replacement without complicating features. 2. Prominent stool volume in the visualized segments of the colon. Electronically Signed   By: Kennith Center M.D.   On: 12/02/2022 19:08   DG Chest Portable 1 View  Result Date: 12/02/2022 CLINICAL DATA:  Shortness of breath. EXAM: PORTABLE CHEST 1 VIEW COMPARISON:  07/14/2019 FINDINGS: Lungs are hyperexpanded. Interstitial markings are diffusely coarsened with chronic features. Cardiopericardial silhouette is at upper limits of normal for size. Esophagus is diffusely air-filled. Haziness over the left upper lung is probably superimposition of soft tissues on this rotated film. Bones are demineralized. Telemetry leads overlie the chest. IMPRESSION: 1. Hyperexpansion with chronic interstitial coarsening. 2. Haziness over the left upper lung is probably superimposition of soft tissues on this rotated film. 3. Paraspinal lucency is probably air in the esophagus. Recommend repeat chest x-ray in the patient is better able to participate in positioning. Alternatively, chest CT could be used to further evaluate and exclude pneumomediastinum. Electronically Signed   By: Kennith Center M.D.   On: 12/02/2022 19:07    Procedures Procedures    Medications Ordered in ED Medications  ipratropium-albuterol (DUONEB) 0.5-2.5 (3) MG/3ML nebulizer solution 3 mL (3 mLs Nebulization Given 12/02/22 2157)  methylPREDNISolone sodium succinate (SOLU-MEDROL) 125 mg/2 mL injection 125 mg (125 mg Intravenous Given 12/02/22 2147)    ED Course/ Medical Decision Making/ A&P Clinical Course as of 12/02/22 2208  Sat Dec 02, 2022  2207 Chest x-ray shows no clear focal consolidation.  EKG shows no evidence of dysrhythmia or ischemia.  CT head unremarkable and CT of the right hip shows no traumatic findings.  Laboratory  workup notable for hypercarbia.  This is likely in the setting of chronic COPD and would be the most likely etiology of his confusion and fatigue.  Will admit to medicine.  Discussed with admitting hospitalist who accepts patient for admission [MP]    Clinical Course User Index [MP] Royanne Foots,  DO                                 Medical Decision Making 57 year old male with history as above presenting for worsening shortness of breath on home O2 along with confusion recent falls and failure to thrive picture.  No obvious traumatic findings on my exam.  No focal neurologic deficits.  He is afebrile and normotensive here.  Most likely underlying etiology would be acute on chronic respiratory failure but will evaluate for more acute processes such as pneumonia, underlying metabolic disturbance and anemia.  Will provide DuoNeb treatment, steroids and initiate BiPAP for likely COPD exacerbation.  Considering history of recent falls will obtain CT head to look for intracranial bleeding along with have a right hip x-ray as patient reports pain here and has a history of hip replacement  Amount and/or Complexity of Data Reviewed Labs: ordered. Radiology: ordered.  Risk Prescription drug management. Decision regarding hospitalization.           Final Clinical Impression(s) / ED Diagnoses Final diagnoses:  Acute respiratory failure with hypoxia and hypercarbia (HCC)  Chronic obstructive pulmonary disease, unspecified COPD type (HCC)  Confusion    Rx / DC Orders ED Discharge Orders     None         Royanne Foots, DO 12/02/22 2209

## 2022-12-02 NOTE — Assessment & Plan Note (Addendum)
Chronic pain syndrome.  Patient on high doses of opiate analgesics.  Will hold for now his regular for of opiates, considering his acute encephalopathy and respiratory failure. May be able to re introduce lower dose as respiratory failure improves.  For now will add low dose IV morphine with close monitoring.  Continue with pregabalin for now.

## 2022-12-02 NOTE — ED Triage Notes (Signed)
Pt from home with reports of weakness/falling, bilateral foot swelling, SOB, and confusion intermittently since Jan but worse this week

## 2022-12-02 NOTE — Assessment & Plan Note (Signed)
Acute on chronic respiratory failure. Advance emphysema.   Plan to admit to step down unit for non invasive mechanical ventilation.  Aggressive bronchodilator therapy. Inhaled and systemic corticosteroids.  Continue Bipap, currently 16/6 with Fi02 40%, generating tidal volumes 350 to 400 ml with minute ventilation about 7. Check VBG around 11:30 pm, may need further adjustments to bipap pressures.

## 2022-12-03 DIAGNOSIS — G9341 Metabolic encephalopathy: Secondary | ICD-10-CM | POA: Diagnosis not present

## 2022-12-03 DIAGNOSIS — F1120 Opioid dependence, uncomplicated: Secondary | ICD-10-CM | POA: Diagnosis not present

## 2022-12-03 DIAGNOSIS — E876 Hypokalemia: Secondary | ICD-10-CM | POA: Diagnosis not present

## 2022-12-03 DIAGNOSIS — J9621 Acute and chronic respiratory failure with hypoxia: Secondary | ICD-10-CM | POA: Diagnosis not present

## 2022-12-03 LAB — HIV ANTIBODY (ROUTINE TESTING W REFLEX): HIV Screen 4th Generation wRfx: NONREACTIVE

## 2022-12-03 LAB — BLOOD GAS, VENOUS
Acid-Base Excess: 27.8 mmol/L — ABNORMAL HIGH (ref 0.0–2.0)
Acid-Base Excess: 27.6 mmol/L — ABNORMAL HIGH (ref 0.0–2.0)
Bicarbonate: 59.7 mmol/L — ABNORMAL HIGH (ref 20.0–28.0)
Bicarbonate: 58.4 mmol/L — ABNORMAL HIGH (ref 20.0–28.0)
Drawn by: 1517
Drawn by: 1517
O2 Saturation: 39 %
O2 Saturation: 89.4 %
Patient temperature: 36.8
Patient temperature: 36.9
pCO2, Ven: 100 mm[Hg] (ref 44–60)
pCO2, Ven: 90 mm[Hg] (ref 44–60)
pH, Ven: 7.38 (ref 7.25–7.43)
pH, Ven: 7.42 (ref 7.25–7.43)
pO2, Ven: <31 mm[Hg] — CL (ref 32–45)
pO2, Ven: 51 mm[Hg] — ABNORMAL HIGH (ref 32–45)

## 2022-12-03 LAB — RAPID URINE DRUG SCREEN, HOSP PERFORMED
Amphetamines: NOT DETECTED
Barbiturates: NOT DETECTED
Benzodiazepines: NOT DETECTED
Cocaine: NOT DETECTED
Opiates: POSITIVE — AB
Tetrahydrocannabinol: NOT DETECTED

## 2022-12-03 LAB — BASIC METABOLIC PANEL
Anion gap: 9 (ref 5–15)
BUN: 14 mg/dL (ref 6–20)
CO2: 45 mmol/L — ABNORMAL HIGH (ref 22–32)
Calcium: 8.8 mg/dL — ABNORMAL LOW (ref 8.9–10.3)
Chloride: 85 mmol/L — ABNORMAL LOW (ref 98–111)
Creatinine, Ser: 0.45 mg/dL — ABNORMAL LOW (ref 0.61–1.24)
GFR, Estimated: >60 mL/min (ref >60)
Glucose, Bld: 132 mg/dL — ABNORMAL HIGH (ref 70–99)
Potassium: 4.2 mmol/L (ref 3.5–5.1)
Sodium: 139 mmol/L (ref 135–145)

## 2022-12-03 LAB — URINALYSIS, ROUTINE W REFLEX MICROSCOPIC
Bilirubin Urine: NEGATIVE
Glucose, UA: 150 mg/dL — AB
Hgb urine dipstick: NEGATIVE
Ketones, ur: NEGATIVE mg/dL
Leukocytes,Ua: NEGATIVE
Nitrite: NEGATIVE
Protein, ur: NEGATIVE mg/dL
Specific Gravity, Urine: 1.02 (ref 1.005–1.030)
pH: 8 (ref 5.0–8.0)

## 2022-12-03 LAB — CBC
HCT: 33.9 % — ABNORMAL LOW (ref 39.0–52.0)
Hemoglobin: 9.9 g/dL — ABNORMAL LOW (ref 13.0–17.0)
MCH: 31.1 pg (ref 26.0–34.0)
MCHC: 29.2 g/dL — ABNORMAL LOW (ref 30.0–36.0)
MCV: 106.6 fL — ABNORMAL HIGH (ref 80.0–100.0)
Platelets: 166 10*3/uL (ref 150–400)
RBC: 3.18 MIL/uL — ABNORMAL LOW (ref 4.22–5.81)
RDW: 11.6 % (ref 11.5–15.5)
WBC: 9.7 10*3/uL (ref 4.0–10.5)
nRBC: 0 % (ref 0.0–0.2)

## 2022-12-03 LAB — MAGNESIUM: Magnesium: 1.9 mg/dL (ref 1.7–2.4)

## 2022-12-03 LAB — MRSA NEXT GEN BY PCR, NASAL: MRSA by PCR Next Gen: NOT DETECTED

## 2022-12-03 MED ORDER — ONDANSETRON HCL 4 MG/2ML IJ SOLN: 4.0000 mg | Freq: Four times a day (QID) | INTRAMUSCULAR | Status: DC | PRN

## 2022-12-03 MED ORDER — ENOXAPARIN SODIUM 40 MG/0.4ML IJ SOSY
40.0000 mg | PREFILLED_SYRINGE | INTRAMUSCULAR | Status: DC
  Administered 2022-12-03 – 2022-12-12 (×10): 40 mg via SUBCUTANEOUS
  Filled 2022-12-03 (×10): qty 0.4

## 2022-12-03 MED ORDER — MORPHINE SULFATE ER 15 MG PO TBCR
30.0000 mg | EXTENDED_RELEASE_TABLET | Freq: Three times a day (TID) | ORAL | Status: DC
  Administered 2022-12-03 – 2022-12-12 (×28): 30 mg via ORAL
  Filled 2022-12-03 (×27): qty 2
  Filled 2022-12-03: qty 1

## 2022-12-03 MED ORDER — IPRATROPIUM-ALBUTEROL 0.5-2.5 (3) MG/3ML IN SOLN
3.0000 mL | RESPIRATORY_TRACT | Status: DC | PRN
  Administered 2022-12-04 – 2022-12-11 (×6): 3 mL via RESPIRATORY_TRACT
  Filled 2022-12-03 (×5): qty 3

## 2022-12-03 MED ORDER — ENSURE ENLIVE PO LIQD
237.0000 mL | Freq: Two times a day (BID) | ORAL | Status: DC
  Administered 2022-12-04 – 2022-12-05 (×3): 237 mL via ORAL

## 2022-12-03 MED ORDER — PREGABALIN 75 MG PO CAPS
150.0000 mg | ORAL_CAPSULE | Freq: Three times a day (TID) | ORAL | Status: DC
  Administered 2022-12-03 – 2022-12-12 (×28): 150 mg via ORAL
  Filled 2022-12-03 (×13): qty 2
  Filled 2022-12-03: qty 6
  Filled 2022-12-03: qty 2
  Filled 2022-12-03: qty 6
  Filled 2022-12-03 (×8): qty 2
  Filled 2022-12-03: qty 6
  Filled 2022-12-03 (×3): qty 2

## 2022-12-03 MED ORDER — ASPIRIN 81 MG PO CHEW
81.0000 mg | CHEWABLE_TABLET | Freq: Every day | ORAL | Status: DC
  Administered 2022-12-03 – 2022-12-12 (×10): 81 mg via ORAL
  Filled 2022-12-03 (×10): qty 1

## 2022-12-03 MED ORDER — IPRATROPIUM-ALBUTEROL 0.5-2.5 (3) MG/3ML IN SOLN
3.0000 mL | Freq: Four times a day (QID) | RESPIRATORY_TRACT | Status: DC
  Administered 2022-12-03 – 2022-12-11 (×33): 3 mL via RESPIRATORY_TRACT
  Filled 2022-12-03 (×31): qty 3

## 2022-12-03 MED ORDER — ONDANSETRON HCL 4 MG PO TABS: 4.0000 mg | ORAL_TABLET | Freq: Four times a day (QID) | ORAL | Status: DC | PRN

## 2022-12-03 MED ORDER — SODIUM CHLORIDE 0.9 % IV SOLN
100.0000 mg | Freq: Two times a day (BID) | INTRAVENOUS | Status: DC
  Administered 2022-12-03 (×2): 100 mg via INTRAVENOUS
  Filled 2022-12-03 (×5): qty 100

## 2022-12-03 MED ORDER — MONTELUKAST SODIUM 10 MG PO TABS: 10.0000 mg | ORAL_TABLET | Freq: Every day | ORAL | Status: DC

## 2022-12-03 MED ORDER — NALOXONE HCL 0.4 MG/ML IJ SOLN: 0.4000 mg | INTRAMUSCULAR | Status: DC | PRN

## 2022-12-03 MED ORDER — ACETAMINOPHEN 650 MG RE SUPP: 650.0000 mg | Freq: Four times a day (QID) | RECTAL | Status: DC | PRN

## 2022-12-03 MED ORDER — ACETAMINOPHEN 325 MG PO TABS
650.0000 mg | ORAL_TABLET | Freq: Four times a day (QID) | ORAL | Status: DC | PRN
  Administered 2022-12-08 – 2022-12-11 (×4): 650 mg via ORAL
  Filled 2022-12-03 (×4): qty 2

## 2022-12-03 MED ORDER — MOMETASONE FURO-FORMOTEROL FUM 200-5 MCG/ACT IN AERO
2.0000 | INHALATION_SPRAY | Freq: Two times a day (BID) | RESPIRATORY_TRACT | Status: DC
  Administered 2022-12-03 – 2022-12-12 (×19): 2 via RESPIRATORY_TRACT
  Filled 2022-12-03 (×2): qty 8.8

## 2022-12-03 MED ORDER — SODIUM CHLORIDE 0.9 % IV BOLUS
500.0000 mL | Freq: Once | INTRAVENOUS | Status: AC
  Administered 2022-12-03: 500 mL via INTRAVENOUS

## 2022-12-03 MED ORDER — CHLORHEXIDINE GLUCONATE CLOTH 2 % EX PADS
6.0000 | MEDICATED_PAD | Freq: Every day | CUTANEOUS | Status: DC
  Administered 2022-12-04 – 2022-12-12 (×8): 6 via TOPICAL

## 2022-12-03 MED ORDER — MORPHINE SULFATE (PF) 2 MG/ML IV SOLN
2.0000 mg | INTRAVENOUS | Status: DC | PRN
  Administered 2022-12-03 – 2022-12-04 (×5): 2 mg via INTRAVENOUS
  Filled 2022-12-03 (×5): qty 1

## 2022-12-03 MED ORDER — POTASSIUM CHLORIDE 10 MEQ/100ML IV SOLN
10.0000 meq | INTRAVENOUS | Status: AC
  Administered 2022-12-03 (×4): 10 meq via INTRAVENOUS
  Filled 2022-12-03 (×4): qty 100

## 2022-12-03 MED ORDER — METHYLPREDNISOLONE SODIUM SUCC 125 MG IJ SOLR
60.0000 mg | Freq: Two times a day (BID) | INTRAMUSCULAR | Status: DC
  Administered 2022-12-03 – 2022-12-08 (×11): 60 mg via INTRAVENOUS
  Filled 2022-12-03 (×11): qty 2

## 2022-12-03 MED ORDER — PANTOPRAZOLE SODIUM 40 MG PO TBEC
40.0000 mg | DELAYED_RELEASE_TABLET | Freq: Every day | ORAL | Status: DC
  Administered 2022-12-03 – 2022-12-12 (×10): 40 mg via ORAL
  Filled 2022-12-03 (×10): qty 1

## 2022-12-03 NOTE — ED Notes (Signed)
RT at bedside.

## 2022-12-03 NOTE — Progress Notes (Signed)
PROGRESS NOTE   Wesley [name]  JYN:[id number] DOB: 1965-03-08 DOA: 12/02/2022 PCP: Toma Deiters, MD   Chief Complaint  Patient presents with   Weakness   Level of care: Stepdown  Brief Admission History:   57 y.o. male with medical history significant of COPD with chronic hypoxia, on 10 L/min per [place] at home, chronic pain syndrome, neuropathy, and osteoporosis who presented with worsening dyspnea and confusion.    Most information from his wife, at the bedside, patient can not give detail history due to confusion and being on full face mask, bipap.   For the last 5 days patient has been experiencing worsening dyspnea, wheezing and productive cough. His wife noted him to be more confused and somnolent. Very poor oral intake, very weak and deconditioned.  Because worsening symptoms she brought him to the ED.    Patient has advanced COPD, with chronic hypoxia, he has high oxygen requirements at baseline. He has been not ambulatory for many years. He continues to smoke cigarettes.    In the ED he was found in acute hypercapnic respiratory failure and was placed on Bipap.     Assessment and Plan: Acute on Chronic Respiratory failure with hypoxia and hypercapnia Acute COPD exacerbation   Continue step down unit for ongoing bipap therapy  Aggressive bronchodilator therapy. Inhaled and systemic corticosteroids.  RT consultation  Add IV doxycycline   Acute metabolic encephalopathy CO2 narcosis  Continue neuro checks Continue non invasive mechanical ventilation.  Severe Protein calorie malnutrition Will consult nutrition for recommendations.   Hypokalemia Repleted   Opiate dependence Chronic pain syndrome.  Patient on high doses of opiate analgesics.  Resume MS contin TID IV morphine ordered PRN for breakthru pain For now will add low dose IV morphine with close monitoring.  Continue with pregabalin for now.   DVT prophylaxis: enoxaparin Code Status: DNR  Family  Communication:  Disposition: admit to stepdown ICU    Consultants:   Procedures:  bipap  Antimicrobials:    Subjective: Pt wanting more pain medication.  Currently off bipap.   Objective: Vitals:   12/03/22 0630 12/03/22 0645 12/03/22 0800 12/03/22 0815  BP: 108/60 113/68 (!) 86/38 (!) 103/40  Pulse: 72 75 80 85  Resp: 17 20 18    Temp:      TempSrc:      SpO2: 98% (!) 81% 91% 100%  Weight:      Height:       No intake or output data in the 24 hours ending 12/03/22 1026 Filed Weights   12/02/22 1718  Weight: 64.9 kg   Examination:  General exam: severely emaciated chronically ill appearing male  Respiratory system: barreled chest of advanced emphysema. Very poor air movement. Cardiovascular system: normal S1 & S2 heard. No JVD, murmurs, rubs, gallops or clicks. No pedal edema. Gastrointestinal system: Abdomen is nondistended, soft and nontender. No organomegaly or masses felt. Normal bowel sounds heard. Central nervous system: Alert and oriented. No focal neurological deficits. Extremities: Symmetric 5 x 5 power. Skin: No rashes, lesions or ulcers. Psychiatry: Judgement and insight appear normal. Mood & affect appropriate.   Data Reviewed: I have personally reviewed following labs and imaging studies  CBC: Recent Labs  Lab 12/02/22 1824 12/03/22 0338  WBC 11.5* 9.7  HGB 11.0* 9.9*  HCT 38.4* 33.9*  MCV 107.0* 106.6*  PLT 195 166    Basic Metabolic Panel: Recent Labs  Lab 12/02/22 1824 12/03/22 0338  NA 140 139  K 3.4* 4.2  CL  82* 85*  CO2 >45* 45*  GLUCOSE 105* 132*  BUN 14 14  CREATININE 0.35* 0.45*  CALCIUM 9.5 8.8*  MG  --  1.9    CBG: Recent Labs  Lab 12/02/22 1736  GLUCAP 100*    Recent Results (from the past 240 hour(s))  Culture, blood (routine x 2)     Status: None (Preliminary result)   Collection Time: 12/02/22  6:25 PM   Specimen: Blood  Result Value Ref Range Status   Specimen Description BLOOD RIGHT ANTECUBITAL  Final    Special Requests   Final    BOTTLES DRAWN AEROBIC AND ANAEROBIC Blood Culture adequate volume Performed at [facility], [address], [place], [place] [address]    Culture PENDING  Incomplete   Report Status PENDING  Incomplete  Culture, blood (routine x 2)     Status: None (Preliminary result)   Collection Time: 12/02/22  7:19 PM   Specimen: BLOOD  Result Value Ref Range Status   Specimen Description BLOOD BLOOD RIGHT HAND  Final   Special Requests   Final    BOTTLES DRAWN AEROBIC AND ANAEROBIC Blood Culture adequate volume Performed at [facility], [address], [place], [place] [address]    Culture PENDING  Incomplete   Report Status PENDING  Incomplete     Radiology Studies: CT Head Wo Contrast  Result Date: 12/02/2022 CLINICAL DATA:  Fall with confusion.  Question subdural hematoma. EXAM: CT HEAD WITHOUT CONTRAST TECHNIQUE: Contiguous axial images were obtained from the base of the skull through the vertex without intravenous contrast. RADIATION DOSE REDUCTION: This exam was performed according to the departmental dose-optimization program which includes automated exposure control, adjustment of the mA and/or kV according to patient size and/or use of iterative reconstruction technique. COMPARISON:  06/14/2019 FINDINGS: Brain: No evidence of acute infarction, hemorrhage, hydrocephalus, extra-axial collection or mass lesion/mass effect. Vascular: No hyperdense vessel or unexpected calcification. Skull: Normal. Negative for fracture or focal lesion. Sinuses/Orbits: No acute finding. Other: None. IMPRESSION: No acute intracranial abnormality. Electronically Signed   By: Kennith Center M.D.   On: 12/02/2022 19:11   DG Hip [place] W or [place] Pelvis 1 View Right  Result Date: 12/02/2022 CLINICAL DATA:  Weakness.  Status post fall. EXAM: DG HIP (WITH OR WITHOUT PELVIS) 1V PORT RIGHT COMPARISON:  10/15/2015 FINDINGS: Bones are diffusely demineralized. Prominent stool volume noted in the  visualized segments of the colon. Patient is status post bilateral hip replacement. AP and frog-leg lateral views of the right hip show no evidence for periprosthetic fracture. No evidence for dislocation. IMPRESSION: 1. Status post bilateral hip replacement without complicating features. 2. Prominent stool volume in the visualized segments of the colon. Electronically Signed   By: Kennith Center M.D.   On: 12/02/2022 19:08   DG Chest Portable 1 View  Result Date: 12/02/2022 CLINICAL DATA:  Shortness of breath. EXAM: PORTABLE CHEST 1 VIEW COMPARISON:  07/14/2019 FINDINGS: Lungs are hyperexpanded. Interstitial markings are diffusely coarsened with chronic features. Cardiopericardial silhouette is at upper limits of normal for size. Esophagus is diffusely air-filled. Haziness over the left upper lung is probably superimposition of soft tissues on this rotated film. Bones are demineralized. Telemetry leads overlie the chest. IMPRESSION: 1. Hyperexpansion with chronic interstitial coarsening. 2. Haziness over the left upper lung is probably superimposition of soft tissues on this rotated film. 3. Paraspinal lucency is probably air in the esophagus. Recommend repeat chest x-ray in the patient is better able to participate in positioning.  Alternatively, chest CT could be used to further evaluate and exclude pneumomediastinum. Electronically Signed   By: Kennith Center M.D.   On: 12/02/2022 19:07    Scheduled Meds:  aspirin  81 mg Oral Q breakfast   enoxaparin (LOVENOX) injection  40 mg Subcutaneous Q24H   ipratropium-albuterol  3 mL Nebulization Q6H   methylPREDNISolone (SOLU-MEDROL) injection  60 mg Intravenous Q12H   mometasone-formoterol  2 puff Inhalation BID   morphine  30 mg Oral Q8H   pregabalin  150 mg Oral TID   Continuous Infusions:   LOS: 1 day   Critical Care Procedure Note Authorized and Performed by: Maryln Manuel MD  Total Critical Care time:  55 mins Due to a high probability of clinically  significant, life threatening deterioration, the patient required my highest level of preparedness to intervene emergently and I personally spent this critical care time directly and personally managing the patient.  This critical care time included obtaining a history; examining the patient, pulse oximetry; ordering and review of studies; arranging urgent treatment with development of a management plan; evaluation of patient's response of treatment; frequent reassessment; and discussions with other providers.  This critical care time was performed to assess and manage the high probability of imminent and life threatening deterioration that could result in multi-organ failure.  It was exclusive of separately billable procedures and treating other patients and teaching time.   Standley Dakins, MD How to contact the [facility] Attending or Consulting provider 7A - 7P or covering provider during after hours 7P -7A, for this patient?  Check the care team in [facility] and look for a) attending/consulting [facility] provider listed and b) the [facility] team listed Log into [email or web address] and use [place]'s universal password to access. If you do not have the password, please contact the hospital operator. Locate the [facility] provider you are looking for under [facility] and page to a number that you can be directly reached. If you still have difficulty reaching the provider, please page the [facility] (Director on Call) for the Hospitalists listed on amion for assistance.  12/03/2022, 10:26 AM

## 2022-12-03 NOTE — Progress Notes (Signed)
RN called earlier and informed this RT that patient had removed Bipap mask and asked to be placed back on [place].  Patient was placed on 6L [place] with sat of 92 to 94%.  Duoneb treatment being given at this time.  Patient stated that Bipap was "messing up" when he tried to move to side of bed to urinate.  Patient also stated, "I have been to the hospital many times and this is the first time I have had to be put on one of these machines".  I explained to patient that sometimes there is always a first time in things.  I also explained that if breathing gets worse, we may have to place patient back on machine.  Patient shook his head in understanding.

## 2022-12-03 NOTE — Plan of Care (Signed)

## 2022-12-03 NOTE — ED Notes (Signed)
ED TO INPATIENT HANDOFF REPORT  ED Nurse Name and Phone #: Wandra Mannan, Paramedic  S Name/Age/Gender Wesley [name] 57 y.o. male Room/Bed: APA08/APA08  Code Status   Code Status: Do not attempt resuscitation (DNR) PRE-ARREST INTERVENTIONS DESIRED  Home/SNF/Other Home Patient oriented to: self, place, time, and situation Is this baseline? Yes   Triage Complete: Triage complete  Chief Complaint Respiratory failure (HCC) [J96.90]  Triage Note Pt from home with reports of weakness/falling, bilateral foot swelling, SOB, and confusion intermittently since Jan but worse this week   Allergies Allergies  Allergen Reactions   Ambien [Zolpidem Tartrate] Other (See Comments)    Hallucinations  Altered mental state   Melatonin Itching    Level of Care/Admitting Diagnosis ED Disposition     ED Disposition  Admit   Condition  --   Comment  Hospital Area: [facility] [[id number]]  Level of Care: Stepdown [14]  Covid Evaluation: Asymptomatic - no recent exposure (last 10 days) testing not required  Diagnosis: Respiratory failure [facility]) [[id number]]  Admitting Physician: Coralie Keens [[id number]]  Attending Physician: Coralie Keens [[id number]]  Certification:: I certify this patient will need inpatient services for at least 2 midnights  Expected Medical Readiness: 12/04/2022          B Medical/Surgery History 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, 2017   Past Surgical History:  Procedure Laterality Date   BACK SURGERY     EYE SURGERY     left eye cornea and lens replaced- blind in left eye   JOINT REPLACEMENT     LEFT HEART CATH AND  CORONARY ANGIOGRAPHY N/A 07/20/2016   Procedure: Left Heart Cath and Coronary Angiography;  Surgeon: Runell Gess, MD;  Location: [facility] INVASIVE CV LAB;  Service: Cardiovascular;  Laterality: N/A;   LUNG SURGERY     TOTAL HIP ARTHROPLASTY Right 09/24/2015   Procedure: TOTAL HIP ARTHROPLASTY ANTERIOR APPROACH;  Surgeon: Ollen Gross, MD;  Location: [facility] ORS;  Service: Orthopedics;  Laterality: Right;     A IV Location/Drains/Wounds Patient Lines/Drains/Airways Status     Active Line/Drains/Airways     Name Placement date Placement time Site Days   Peripheral IV 12/02/22 20 G 1" Right Antecubital 12/02/22  1745  Antecubital  1   Peripheral IV 12/02/22 20 G 1" Anterior;Left Forearm 12/02/22  1746  Forearm  1            Intake/Output Last 24 hours No intake or output data in the 24 hours ending 12/03/22 1302  Labs/Imaging Results for orders placed or performed during the hospital encounter of 12/02/22 (from the past 48 hour(s))  CBG monitoring, ED     Status: Abnormal   Collection Time: 12/02/22  5:36 PM  Result Value Ref Range   Glucose-Capillary 100 (H) 70 - 99 mg/dL    Comment: Glucose reference range applies only to samples taken after fasting for at least 8 hours.  CBC     Status: Abnormal   Collection Time: 12/02/22  6:24 PM  Result Value Ref Range   WBC 11.5 (H) 4.0 -  10.5 K/uL   RBC 3.59 (L) 4.22 - 5.81 MIL/uL   Hemoglobin 11.0 (L) 13.0 - 17.0 g/dL   HCT 16.1 (L) 09.6 - 04.5 %   MCV 107.0 (H) 80.0 - 100.0 fL   MCH 30.6 26.0 - 34.0 pg   MCHC 28.6 (L) 30.0 - 36.0 g/dL   RDW 40.9 81.1 - 91.4 %   Platelets 195 150 - 400 K/uL   nRBC 0.0 0.0 - 0.2 %    Comment: Performed at [facility], [address], [place], [place] [address]  Comprehensive metabolic panel     Status: Abnormal   Collection Time: 12/02/22  6:24 PM  Result Value Ref Range   Sodium 140 135 - 145 mmol/L   Potassium 3.4 (L) 3.5 - 5.1 mmol/L   Chloride 82 (L) 98 - 111 mmol/L   CO2 >45 (H) 22 - 32 mmol/L     Comment: ELECTROLYTES REPEATED TO VERIFY NN   Glucose, Bld 105 (H) 70 - 99 mg/dL    Comment: Glucose reference range applies only to samples taken after fasting for at least 8 hours.   BUN 14 6 - 20 mg/dL   Creatinine, Ser 6.21 (L) 0.61 - 1.24 mg/dL   Calcium 9.5 8.9 - 30.8 mg/dL   Total Protein 7.7 6.5 - 8.1 g/dL   Albumin 4.2 3.5 - 5.0 g/dL   AST 16 15 - 41 U/L   ALT 16 0 - 44 U/L   Alkaline Phosphatase 73 38 - 126 U/L   Total Bilirubin 0.2 (L) 0.3 - 1.2 mg/dL   GFR, Estimated >65 >78 mL/min    Comment: (NOTE) Calculated using the CKD-EPI Creatinine Equation (2021)    Anion gap NOT CALCULATED 5 - 15    Comment: Performed at [facility], [address], [place], [place] [address]  Ammonia     Status: None   Collection Time: 12/02/22  6:24 PM  Result Value Ref Range   Ammonia 24 9 - 35 umol/L    Comment: Performed at [facility], [address] [place], [place] [address]  Brain natriuretic peptide     Status: None   Collection Time: 12/02/22  6:24 PM  Result Value Ref Range   B Natriuretic Peptide 20.0 0.0 - 100.0 pg/mL    Comment: Performed at [facility], [address], [place], [place] [address]  Culture, blood (routine x 2)     Status: None (Preliminary result)   Collection Time: 12/02/22  6:25 PM   Specimen: BLOOD  Result Value Ref Range   Specimen Description BLOOD RIGHT ANTECUBITAL    Special Requests      BOTTLES DRAWN AEROBIC AND ANAEROBIC Blood Culture adequate volume   Culture      NO GROWTH < 24 HOURS Performed at [facility], [address], [place], [place] [address]    Report Status PENDING   Culture, blood (routine x 2)     Status: None (Preliminary result)   Collection Time: 12/02/22  7:19 PM   Specimen: BLOOD  Result Value Ref Range   Specimen Description BLOOD BLOOD RIGHT HAND    Special Requests      BOTTLES DRAWN AEROBIC AND ANAEROBIC Blood Culture adequate volume   Culture      NO GROWTH < 24 HOURS Performed at [facility], [address], [place], [place] [address]    Report Status PENDING   Blood gas, venous (at [facility] and [facility])     Status: Abnormal   Collection  Time: 12/02/22  8:53 PM  Result Value Ref Range   pH, Ven 7.27 7.25 - 7.43   pCO2, Ven >123 (HH) 44 - 60 mmHg    Comment: CRITICAL RESULT CALLED TO, READ BACK BY AND VERIFIED WITH: ANGIE CLINTON 12/02/23 2100 NN    pO2, Ven 38 32 - 45 mmHg   Bicarbonate 66.6 (H) 20.0 - 28.0 mmol/L   Acid-Base Excess 31.0 (H) 0.0 - 2.0 mmol/L   O2 Saturation 71.2 %   Patient temperature 37.1    Collection site BLOOD RIGHT HAND    Drawn by [phone]     Comment: Performed at [facility], [address] [place], [place] [address]  Basic metabolic panel     Status: Abnormal   Collection Time: 12/03/22  3:38 AM  Result Value Ref Range   Sodium 139 135 - 145 mmol/L   Potassium 4.2 3.5 - 5.1 mmol/L   Chloride 85 (L) 98 - 111 mmol/L   CO2 45 (H) 22 - 32 mmol/L   Glucose, Bld 132 (H) 70 - 99 mg/dL    Comment: Glucose reference range applies only to samples taken after fasting for at least 8 hours.   BUN 14 6 - 20 mg/dL   Creatinine, Ser 0.98 (L) 0.61 - 1.24 mg/dL   Calcium 8.8 (L) 8.9 - 10.3 mg/dL   GFR, Estimated >11 >91 mL/min    Comment: (NOTE) Calculated using the CKD-EPI Creatinine Equation (2021)    Anion gap 9 5 - 15    Comment: Performed at [facility], [address] [place], [place] [address]  CBC     Status: Abnormal   Collection Time: 12/03/22  3:38 AM  Result Value Ref Range   WBC 9.7 4.0 - 10.5 K/uL   RBC 3.18 (L) 4.22 - 5.81 MIL/uL   Hemoglobin 9.9 (L) 13.0 - 17.0 g/dL   HCT 56.2 (L) 13.0 - 86.5 %   MCV 106.6 (H) 80.0 - 100.0 fL   MCH 31.1 26.0 - 34.0 pg   MCHC 29.2 (L) 30.0 - 36.0 g/dL   RDW 78.4 69.6 - 29.5 %   Platelets 166 150 - 400 K/uL   nRBC 0.0 0.0 - 0.2 %    Comment: Performed at [facility], [address], [place], [place] [address]  Magnesium     Status: None   Collection Time: 12/03/22  3:38 AM  Result Value Ref Range   Magnesium 1.9 1.7 - 2.4  mg/dL    Comment: Performed at [facility], [address], [place], [place] [address]  Blood gas, venous     Status: Abnormal   Collection Time: 12/03/22  3:38 AM  Result Value Ref Range   pH, Ven 7.38 7.25 - 7.43   pCO2, Ven 100 (HH) 44 - 60 mmHg    Comment: CRITICAL RESULT CALLED TO, READ BACK BY AND VERIFIED WITH: T TALBOTT AT 0954 ON [id number] BY S DALTON VALUE NOT CALLED ON 3RD SHIFT    pO2, Ven <31 (LL) 32 - 45 mmHg    Comment: CRITICAL RESULT CALLED TO, READ BACK BY AND VERIFIED WITH: T TALBOTT AT 0954 ON [id number] BY S DALTON VALUE NOT CALLED ON 3RD SHIFT. NEW VALUE AT 0630 IS NO LONGER CRITICAL    Bicarbonate 59.7 (H) 20.0 - 28.0 mmol/L   Acid-Base Excess 27.8 (H) 0.0 - 2.0 mmol/L   O2 Saturation 39 %   Patient temperature 36.8    Collection site LEFT ANTECUBITAL    Drawn by  [id number]     Comment: Performed at [facility], [address] [place], [place] [address]  HIV Antibody (routine testing w rflx)     Status: None   Collection Time: 12/03/22  3:38 AM  Result Value Ref Range   HIV Screen 4th Generation wRfx Non Reactive Non Reactive    Comment: Performed at [facility], [address], [place], [place] [address]  Blood gas, venous     Status: Abnormal   Collection Time: 12/03/22  6:31 AM  Result Value Ref Range   pH, Ven 7.42 7.25 - 7.43   pCO2, Ven 90 (HH) 44 - 60 mmHg    Comment: CRITICAL RESULT CALLED TO, READ BACK BY AND VERIFIED WITH: G PRUITT AT 0649 ON [id number] BY S DALTON    pO2, Ven 51 (H) 32 - 45 mmHg   Bicarbonate 58.4 (H) 20.0 - 28.0 mmol/L   Acid-Base Excess 27.6 (H) 0.0 - 2.0 mmol/L   O2 Saturation 89.4 %   Patient temperature 36.9    Collection site LEFT ANTECUBITAL    Drawn by [id number]     Comment: Performed at [facility], [address], [place], [place] [address]   CT Head Wo Contrast  Result Date: 12/02/2022 CLINICAL DATA:  Fall with confusion.  Question subdural hematoma. EXAM: CT HEAD WITHOUT CONTRAST TECHNIQUE: Contiguous axial images  were obtained from the base of the skull through the vertex without intravenous contrast. RADIATION DOSE REDUCTION: This exam was performed according to the departmental dose-optimization program which includes automated exposure control, adjustment of the mA and/or kV according to patient size and/or use of iterative reconstruction technique. COMPARISON:  06/14/2019 FINDINGS: Brain: No evidence of acute infarction, hemorrhage, hydrocephalus, extra-axial collection or mass lesion/mass effect. Vascular: No hyperdense vessel or unexpected calcification. Skull: Normal. Negative for fracture or focal lesion. Sinuses/Orbits: No acute finding. Other: None. IMPRESSION: No acute intracranial abnormality. Electronically Signed   By: Kennith Center M.D.   On: 12/02/2022 19:11   DG Hip [place] or [place] Pelvis 1 View Right  Result Date: 12/02/2022 CLINICAL DATA:  Weakness.  Status post fall. EXAM: DG HIP (WITH OR WITHOUT PELVIS) 1V PORT RIGHT COMPARISON:  10/15/2015 FINDINGS: Bones are diffusely demineralized. Prominent stool volume noted in the visualized segments of the colon. Patient is status post bilateral hip replacement. AP and frog-leg lateral views of the right hip show no evidence for periprosthetic fracture. No evidence for dislocation. IMPRESSION: 1. Status post bilateral hip replacement without complicating features. 2. Prominent stool volume in the visualized segments of the colon. Electronically Signed   By: Kennith Center M.D.   On: 12/02/2022 19:08   DG Chest Portable 1 View  Result Date: 12/02/2022 CLINICAL DATA:  Shortness of breath. EXAM: PORTABLE CHEST 1 VIEW COMPARISON:  07/14/2019 FINDINGS: Lungs are hyperexpanded. Interstitial markings are diffusely coarsened with chronic features. Cardiopericardial silhouette is at upper limits of normal for size. Esophagus is diffusely air-filled. Haziness over the left upper lung is probably superimposition of soft tissues on this rotated film. Bones are  demineralized. Telemetry leads overlie the chest. IMPRESSION: 1. Hyperexpansion with chronic interstitial coarsening. 2. Haziness over the left upper lung is probably superimposition of soft tissues on this rotated film. 3. Paraspinal lucency is probably air in the esophagus. Recommend repeat chest x-ray in the patient is better able to participate in positioning. Alternatively, chest CT could be used to further evaluate and exclude pneumomediastinum. Electronically Signed   By: Jamison Oka.D.  On: 12/02/2022 19:07    Pending Labs Unresulted Labs (From admission, onward)     Start     Ordered   12/09/22 0500  Creatinine, serum  (enoxaparin (LOVENOX)    CrCl >/= 30 ml/min)  Weekly,   R     Comments: while on enoxaparin therapy    12/03/22 0240   12/02/22 1721  Urinalysis, Routine w reflex microscopic -Urine, Clean Catch  Once,   URGENT       Question:  Specimen Source  Answer:  Urine, Clean Catch   12/02/22 1720            Vitals/Pain Today's Vitals   12/03/22 0800 12/03/22 0815 12/03/22 0925 12/03/22 1115  BP: (!) 86/38 (!) 103/40  (!) 106/57  Pulse: 80 85  (!) 109  Resp: 18     Temp:      TempSrc:      SpO2: 91% 100%  92%  Weight:      Height:      PainSc:   9      Isolation Precautions No active isolations  Medications Medications  aspirin chewable tablet 81 mg (81 mg Oral Given 12/03/22 0926)  pregabalin (LYRICA) capsule 150 mg (150 mg Oral Given 12/03/22 0925)  mometasone-formoterol (DULERA) 200-5 MCG/ACT inhaler 2 puff (2 puffs Inhalation Given 12/03/22 0803)  enoxaparin (LOVENOX) injection 40 mg (40 mg Subcutaneous Given 12/03/22 0919)  acetaminophen (TYLENOL) tablet 650 mg (has no administration in time range)    Or  acetaminophen (TYLENOL) suppository 650 mg (has no administration in time range)  ondansetron (ZOFRAN) tablet 4 mg (has no administration in time range)    Or  ondansetron (ZOFRAN) injection 4 mg (has no administration in time range)   ipratropium-albuterol (DUONEB) 0.5-2.5 (3) MG/3ML nebulizer solution 3 mL (has no administration in time range)  ipratropium-albuterol (DUONEB) 0.5-2.5 (3) MG/3ML nebulizer solution 3 mL (3 mLs Nebulization Given 12/03/22 0759)  methylPREDNISolone sodium succinate (SOLU-MEDROL) 125 mg/2 mL injection 60 mg (60 mg Intravenous Given 12/03/22 0918)  morphine (PF) 2 MG/ML injection 2 mg (2 mg Intravenous Given 12/03/22 0927)  morphine (MS CONTIN) 12 hr tablet 30 mg (has no administration in time range)  doxycycline (VIBRAMYCIN) 100 mg in sodium chloride 0.9 % 250 mL IVPB (has no administration in time range)  pantoprazole (PROTONIX) EC tablet 40 mg (has no administration in time range)  naloxone [facility]) injection 0.4 mg (has no administration in time range)  ipratropium-albuterol (DUONEB) 0.5-2.5 (3) MG/3ML nebulizer solution 3 mL (3 mLs Nebulization Given 12/02/22 2157)  methylPREDNISolone sodium succinate (SOLU-MEDROL) 125 mg/2 mL injection 125 mg (125 mg Intravenous Given 12/02/22 2147)  potassium chloride 10 mEq in 100 mL IVPB (0 mEq Intravenous Stopped 12/03/22 0812)  pneumococcal 20-valent conjugate vaccine (PREVNAR 20) injection 0.5 mL (0.5 mLs Intramuscular Given 12/03/22 0920)  influenza vac split trivalent PF (FLULAVAL) injection 0.5 mL (0.5 mLs Intramuscular Given 12/03/22 0922)  sodium chloride 0.9 % bolus 500 mL (0 mLs Intravenous Stopped 12/03/22 0425)    Mobility walks     Focused Assessments Pulmonary Assessment Handoff:  Lung sounds: Bilateral Breath Sounds: Expiratory wheezes L Breath Sounds: Diminished R Breath Sounds: Diminished O2 Device: Nasal Cannula O2 Flow Rate (L/min): 5 L/min    R Recommendations: See Admitting Provider Note  Report given to:   Additional Notes: Pt has been non-compliant with Bi-PAP and insists on having o2 at 10lpm on nasal cannula despite recommendations from RRT and EDP.

## 2022-12-03 NOTE — Hospital Course (Signed)
57 y.o. male with medical history significant of COPD with chronic hypoxia, on 10 L/min per [place] at home, chronic pain syndrome, neuropathy, and osteoporosis who presented with worsening dyspnea and confusion.    Most information from his wife, at the bedside, patient can not give detail history due to confusion and being on full face mask, bipap.   For the last 5 days patient has been experiencing worsening dyspnea, wheezing and productive cough. His wife noted him to be more confused and somnolent. Very poor oral intake, very weak and deconditioned.  Because worsening symptoms she brought him to the ED.    Patient has advanced COPD, with chronic hypoxia, he has high oxygen requirements at baseline. He has been not ambulatory for many years. He continues to smoke cigarettes.    In the ED he was found in acute hypercapnic respiratory failure and was placed on Bipap.

## 2022-12-03 NOTE — ED Notes (Signed)
This RN entered Pts room in response to BiPap Alarm. Pt found to have removed BiPap from his face. This RN returned BiPap to Pt and provided the Pt education on the importance of following medical treatment therapy to improve oxygenation.

## 2022-12-03 NOTE — Progress Notes (Signed)
   12/03/22 1237  TOC Brief Assessment  Insurance and Status Reviewed  Patient has primary care physician Yes  Home environment has been reviewed From home with spouse  Prior level of function: Independent  Prior/Current Home Services No current home services  Social Determinants of Health Reivew SDOH reviewed no interventions necessary  Transition of care needs no transition of care needs at this time     Transition of Care Department [facility]) has reviewed patient and no TOC needs have been identified at this time. We will continue to monitor patient advancement through interdisciplinary progression rounds. If new patient transition needs arise, please place a TOC consult.

## 2022-12-03 NOTE — ED Notes (Signed)
Pt demanding for bipap to be removed. Informed RT, removed and placed on 7L Power. Pt requesting this RN to increase o2 flow. Educated pt on contraindications for COPD pt to have high flow supplemental oxygen. Pt requesting food and pain medicine. Will continue to monitor.

## 2022-12-03 NOTE — ED Notes (Signed)
MD Zierle-Ghosh made aware of hypotension. 500 bolus of normal saline ordered. Pt states his blood pressure is normally low.

## 2022-12-03 NOTE — Plan of Care (Signed)

## 2022-12-04 ENCOUNTER — Inpatient Hospital Stay (HOSPITAL_COMMUNITY): Payer: Medicare HMO

## 2022-12-04 DIAGNOSIS — J9621 Acute and chronic respiratory failure with hypoxia: Secondary | ICD-10-CM | POA: Diagnosis not present

## 2022-12-04 DIAGNOSIS — E46 Unspecified protein-calorie malnutrition: Secondary | ICD-10-CM | POA: Diagnosis not present

## 2022-12-04 DIAGNOSIS — J9622 Acute and chronic respiratory failure with hypercapnia: Secondary | ICD-10-CM | POA: Diagnosis not present

## 2022-12-04 DIAGNOSIS — F1120 Opioid dependence, uncomplicated: Secondary | ICD-10-CM | POA: Diagnosis not present

## 2022-12-04 MED ORDER — DOXYCYCLINE HYCLATE 100 MG PO TABS
100.0000 mg | ORAL_TABLET | Freq: Two times a day (BID) | ORAL | Status: DC
  Administered 2022-12-04 – 2022-12-12 (×17): 100 mg via ORAL
  Filled 2022-12-04 (×17): qty 1

## 2022-12-04 MED ORDER — OXYCODONE HCL 5 MG PO TABS
30.0000 mg | ORAL_TABLET | ORAL | Status: DC | PRN
  Administered 2022-12-04 – 2022-12-12 (×39): 30 mg via ORAL
  Filled 2022-12-04 (×40): qty 6

## 2022-12-04 MED ORDER — ALBUTEROL SULFATE HFA 108 (90 BASE) MCG/ACT IN AERS
2.0000 | INHALATION_SPRAY | RESPIRATORY_TRACT | Status: DC | PRN
  Administered 2022-12-04: 2 via RESPIRATORY_TRACT

## 2022-12-04 MED ORDER — FUROSEMIDE 10 MG/ML IJ SOLN
30.0000 mg | Freq: Every day | INTRAMUSCULAR | Status: DC
  Administered 2022-12-04 – 2022-12-06 (×3): 30 mg via INTRAVENOUS
  Filled 2022-12-04 (×3): qty 4

## 2022-12-04 NOTE — Progress Notes (Signed)
Patient states he will let me know if he wants to wear the BIPAP. V60 unit at bedside on standby. Patient currently on 8 lpm with an O2 sat of 100%.

## 2022-12-04 NOTE — Progress Notes (Signed)
Upon finishing breathing treatment and fixing to leave patient's room patient requested that his O2 be turned up and asked what it was currently set on. Explained and educated patient on the importance of using the least amount of O2 possible to prevent his CO2 from rising. Patient is adamant that the O2 be increased even though he is not actively SOB, just finished a nebulizer treatment at 9 lpm and his O2 sat is 100%. This RT walked over to flowmeter and refilled water canister. I did not increase the O2 but patient thought that I had. RN made aware. Will continue to try and educate patient. He does not wish to wear the BIPAP but explained to him his high O2 intake is part of what is causing the CO2 increase and for him to have to wear BIPAP.

## 2022-12-04 NOTE — Progress Notes (Signed)
Patient and patient's daughter requesting information on DNR vs MOST form as well as POA paperwork. Dr. Laural Benes made aware. Palliative consult ordered and chaplain consult ordered to assist with these requests. Patient and daughter also requesting for patient to have a CT scan. Patient made aware that he would have to comply and lay flat for up to 15 minutes for the CT scan to be completed. Patient agreeable, just requested that his pain medicine be given prior to CT.

## 2022-12-04 NOTE — Progress Notes (Signed)
Pt. Refused Bipap machine throughout the night and morning. Nurse asked/encouraged  pt. Multiple times during the night and morning on the importance of compliance of treatment. Pt. Stated, "after you give me all my pain medications and my IV pain medication, I will get on it for a little bit but I need all my pain meds first." Nurse returned back with pain medications when it was time according to the [facility] and gave pt. Medications. Nurse asked pt. If he was ready to place on the bipap and pt. Said, "No, I don't want to, but increase my oxygen." Pt. O2 sats at 99 on HFNC 6L. Nurse educated pt. About contraindications for increasing O2 with current O2 sats and nurse explained to pt. The need of the bipap machine. Pt. Became irritated with this response and strongly refused. Nurse returned approx. 10 minutes later and nurse found pt.'s oxygen to be turned to 9L and O2 sats 98%. Nurse turned O2 down to 6L.

## 2022-12-04 NOTE — Progress Notes (Signed)
PROGRESS NOTE   Wesley [name]  ZOX:[id number] DOB: Dec 13, 1965 DOA: 12/02/2022 PCP: Toma Deiters, MD   Chief Complaint  Patient presents with   Weakness   Level of care: Stepdown  Brief Admission History:   57 y.o. male with medical history significant of COPD with chronic hypoxia, on 10 L/min per [place] at home, chronic pain syndrome, neuropathy, and osteoporosis who presented with worsening dyspnea and confusion.    Most information from his wife, at the bedside, patient can not give detail history due to confusion and being on full face mask, bipap.   For the last 5 days patient has been experiencing worsening dyspnea, wheezing and productive cough. His wife noted him to be more confused and somnolent. Very poor oral intake, very weak and deconditioned.  Because worsening symptoms she brought him to the ED.    Patient has advanced COPD, with chronic hypoxia, he has high oxygen requirements at baseline. He has been not ambulatory for many years. He continues to smoke cigarettes.    In the ED he was found in acute hypercapnic respiratory failure and was placed on Bipap.     Assessment and Plan: Acute on Chronic Respiratory failure with hypoxia and hypercapnia Acute COPD exacerbation   Admitted to step down unit for ongoing bipap therapy  Aggressive bronchodilator therapy. Inhaled and systemic corticosteroids.  RT consultation  continue doxycycline Added lasix 30 mg IV daily  Pt insisting on bumping [place] oxygen to 10L/min This places him at high risk for recurrent CO2 narcosis and death, I explained to him and he verbalized understanding but he continues to change oxygen delivery when nurse leaves room  Acute metabolic encephalopathy CO2 narcosis  Continue neuro checks Pt refusing bipap I offered home NIV but he did not seem interested Pt high risk for recurrent CO2 narcosis given refusal to wear bipap consistently, refusal to stop inappropriately increasing oxygen  delivery  Severe Protein calorie malnutrition Will consult nutrition for recommendations.   Hypokalemia Repleted   Opiate dependence Chronic pain syndrome.  Patient on high doses of opiate analgesics.  Resumed MS contin TID Pt having aggressive opioid seeking behaviors Pt insisting on changing IV morphine to IV dilaudid 3-4 mg every 4 hours Because he is refusing to wear bipap, there is no reason to have him on IV opioids Resuming his home pain mgmt regimen, DC IV morphine, resume home oxycodone 30 mg Q4 hours Continue with home pregabalin.   DVT prophylaxis: enoxaparin Code Status: DNR  Family Communication:  Disposition: admit to stepdown ICU    Consultants:   Procedures:  bipap  Antimicrobials:  Doxycycline 9/29>>   Subjective: Pt not cooperating with care, increasing oxygen against nursing and RT, refusing to wear bipap, trying to dictate the dose of IV dilaudid he wants me to order    Objective: Vitals:   12/04/22 0800 12/04/22 0804 12/04/22 0806 12/04/22 0810  BP: 119/70     Pulse:    96  Resp:    15  Temp:  (!) 97.2 F (36.2 C)    TempSrc:  Oral    SpO2: 98%  100% 100%  Weight:      Height:        Intake/Output Summary (Last 24 hours) at 12/04/2022 1151 Last data filed at 12/04/2022 0927 Gross per 24 hour  Intake 721.1 ml  Output 800 ml  Net -78.9 ml   Filed Weights   12/02/22 1718 12/03/22 1421 12/04/22 0400  Weight: 64.9 kg 58.7 kg 59.4  kg   Examination:  General exam: severely emaciated chronically ill appearing male  Respiratory system: barreled chest of advanced emphysema. Very poor air movement. Cardiovascular system: normal S1 & S2 heard. No JVD, murmurs, rubs, gallops or clicks. 2+ pedal edema. Gastrointestinal system: Abdomen is nondistended, soft and nontender. No organomegaly or masses felt. Normal bowel sounds heard. Central nervous system: Alert and oriented. No focal neurological deficits. Extremities: 2+ pitting edema BLEs.  Symmetric 5 x 5 power. Skin: No rashes, lesions or ulcers. Psychiatry: Judgement and insight appear normal. Mood & affect appropriate.   Data Reviewed: I have personally reviewed following labs and imaging studies  CBC: Recent Labs  Lab 12/02/22 1824 12/03/22 0338  WBC 11.5* 9.7  HGB 11.0* 9.9*  HCT 38.4* 33.9*  MCV 107.0* 106.6*  PLT 195 166    Basic Metabolic Panel: Recent Labs  Lab 12/02/22 1824 12/03/22 0338  NA 140 139  K 3.4* 4.2  CL 82* 85*  CO2 >45* 45*  GLUCOSE 105* 132*  BUN 14 14  CREATININE 0.35* 0.45*  CALCIUM 9.5 8.8*  MG  --  1.9    CBG: Recent Labs  Lab 12/02/22 1736  GLUCAP 100*    Recent Results (from the past 240 hour(s))  Culture, blood (routine x 2)     Status: None (Preliminary result)   Collection Time: 12/02/22  6:25 PM   Specimen: BLOOD  Result Value Ref Range Status   Specimen Description BLOOD RIGHT ANTECUBITAL  Final   Special Requests   Final    BOTTLES DRAWN AEROBIC AND ANAEROBIC Blood Culture adequate volume   Culture   Final    NO GROWTH 2 DAYS Performed at [facility], [address] [place], [place] [address]    Report Status PENDING  Incomplete  Culture, blood (routine x 2)     Status: None (Preliminary result)   Collection Time: 12/02/22  7:19 PM   Specimen: BLOOD  Result Value Ref Range Status   Specimen Description BLOOD BLOOD RIGHT HAND  Final   Special Requests   Final    BOTTLES DRAWN AEROBIC AND ANAEROBIC Blood Culture adequate volume   Culture   Final    NO GROWTH 2 DAYS Performed at [facility], [address], [place], [place] [address]    Report Status PENDING  Incomplete  MRSA Next Gen by PCR, Nasal     Status: None   Collection Time: 12/03/22  2:15 PM   Specimen: Nasal Mucosa; Nasal Swab  Result Value Ref Range Status   MRSA by PCR Next Gen NOT DETECTED NOT DETECTED Final    Comment: (NOTE) The GeneXpert MRSA Assay (FDA approved for NASAL specimens only), is one component of a comprehensive MRSA  colonization surveillance program. It is not intended to diagnose MRSA infection nor to guide or monitor treatment for MRSA infections. Test performance is not FDA approved in patients less than 62 years old. Performed at [facility], [address], [place], [place] [address]      Radiology Studies: CT Head Wo Contrast  Result Date: 12/02/2022 CLINICAL DATA:  Fall with confusion.  Question subdural hematoma. EXAM: CT HEAD WITHOUT CONTRAST TECHNIQUE: Contiguous axial images were obtained from the base of the skull through the vertex without intravenous contrast. RADIATION DOSE REDUCTION: This exam was performed according to the departmental dose-optimization program which includes automated exposure control, adjustment of the mA and/or kV according to patient size and/or use of iterative reconstruction technique. COMPARISON:  06/14/2019 FINDINGS: Brain: No  evidence of acute infarction, hemorrhage, hydrocephalus, extra-axial collection or mass lesion/mass effect. Vascular: No hyperdense vessel or unexpected calcification. Skull: Normal. Negative for fracture or focal lesion. Sinuses/Orbits: No acute finding. Other: None. IMPRESSION: No acute intracranial abnormality. Electronically Signed   By: Kennith Center M.D.   On: 12/02/2022 19:11   DG Hip [place] W or [place] Pelvis 1 View Right  Result Date: 12/02/2022 CLINICAL DATA:  Weakness.  Status post fall. EXAM: DG HIP (WITH OR WITHOUT PELVIS) 1V PORT RIGHT COMPARISON:  10/15/2015 FINDINGS: Bones are diffusely demineralized. Prominent stool volume noted in the visualized segments of the colon. Patient is status post bilateral hip replacement. AP and frog-leg lateral views of the right hip show no evidence for periprosthetic fracture. No evidence for dislocation. IMPRESSION: 1. Status post bilateral hip replacement without complicating features. 2. Prominent stool volume in the visualized segments of the colon. Electronically Signed   By: Kennith Center M.D.    On: 12/02/2022 19:08   DG Chest Portable 1 View  Result Date: 12/02/2022 CLINICAL DATA:  Shortness of breath. EXAM: PORTABLE CHEST 1 VIEW COMPARISON:  07/14/2019 FINDINGS: Lungs are hyperexpanded. Interstitial markings are diffusely coarsened with chronic features. Cardiopericardial silhouette is at upper limits of normal for size. Esophagus is diffusely air-filled. Haziness over the left upper lung is probably superimposition of soft tissues on this rotated film. Bones are demineralized. Telemetry leads overlie the chest. IMPRESSION: 1. Hyperexpansion with chronic interstitial coarsening. 2. Haziness over the left upper lung is probably superimposition of soft tissues on this rotated film. 3. Paraspinal lucency is probably air in the esophagus. Recommend repeat chest x-ray in the patient is better able to participate in positioning. Alternatively, chest CT could be used to further evaluate and exclude pneumomediastinum. Electronically Signed   By: Kennith Center M.D.   On: 12/02/2022 19:07    Scheduled Meds:  aspirin  81 mg Oral Q breakfast   Chlorhexidine Gluconate Cloth  6 each Topical Q0600   doxycycline  100 mg Oral Q12H   enoxaparin (LOVENOX) injection  40 mg Subcutaneous Q24H   feeding supplement  237 mL Oral BID BM   furosemide  30 mg Intravenous Daily   ipratropium-albuterol  3 mL Nebulization Q6H   methylPREDNISolone (SOLU-MEDROL) injection  60 mg Intravenous Q12H   mometasone-formoterol  2 puff Inhalation BID   morphine  30 mg Oral Q8H   pantoprazole  40 mg Oral QAC breakfast   pregabalin  150 mg Oral TID   Continuous Infusions:   LOS: 2 days   Critical Care Procedure Note Authorized and Performed by: Maryln Manuel MD  Total Critical Care time:  56 mins Due to a high probability of clinically significant, life threatening deterioration, the patient required my highest level of preparedness to intervene emergently and I personally spent this critical care time directly and  personally managing the patient.  This critical care time included obtaining a history; examining the patient, pulse oximetry; ordering and review of studies; arranging urgent treatment with development of a management plan; evaluation of patient's response of treatment; frequent reassessment; and discussions with other providers.  This critical care time was performed to assess and manage the high probability of imminent and life threatening deterioration that could result in multi-organ failure.  It was exclusive of separately billable procedures and treating other patients and teaching time.   Standley Dakins, MD How to contact the [facility] Attending or Consulting provider 7A - 7P or covering provider during after hours 7P -7A,  for this patient?  Check the care team in [facility] and look for a) attending/consulting TRH provider listed and b) the [facility] team listed Log into [email or web address] and use 's universal password to access. If you do not have the password, please contact the hospital operator. Locate the [facility] provider you are looking for under [facility] and page to a number that you can be directly reached. If you still have difficulty reaching the provider, please page the [facility] (Director on Call) for the Hospitalists listed on amion for assistance.  12/04/2022, 11:51 AM

## 2022-12-04 NOTE — Progress Notes (Signed)
On assessment pt noted to have +3 pitting edema on both feet, Dr. Laural Benes notified, Lasix ordered.

## 2022-12-05 ENCOUNTER — Encounter (HOSPITAL_COMMUNITY): Payer: Self-pay | Admitting: Internal Medicine

## 2022-12-05 DIAGNOSIS — Z515 Encounter for palliative care: Secondary | ICD-10-CM | POA: Diagnosis not present

## 2022-12-05 DIAGNOSIS — J9621 Acute and chronic respiratory failure with hypoxia: Secondary | ICD-10-CM | POA: Diagnosis not present

## 2022-12-05 DIAGNOSIS — J9622 Acute and chronic respiratory failure with hypercapnia: Secondary | ICD-10-CM | POA: Diagnosis not present

## 2022-12-05 DIAGNOSIS — I5031 Acute diastolic (congestive) heart failure: Secondary | ICD-10-CM

## 2022-12-05 DIAGNOSIS — Z7189 Other specified counseling: Secondary | ICD-10-CM | POA: Diagnosis not present

## 2022-12-05 DIAGNOSIS — E43 Unspecified severe protein-calorie malnutrition: Secondary | ICD-10-CM | POA: Insufficient documentation

## 2022-12-05 DIAGNOSIS — E876 Hypokalemia: Secondary | ICD-10-CM | POA: Diagnosis not present

## 2022-12-05 DIAGNOSIS — G9341 Metabolic encephalopathy: Secondary | ICD-10-CM | POA: Diagnosis not present

## 2022-12-05 LAB — BASIC METABOLIC PANEL
Anion gap: 11 (ref 5–15)
BUN: 21 mg/dL — ABNORMAL HIGH (ref 6–20)
CO2: 37 mmol/L — ABNORMAL HIGH (ref 22–32)
Calcium: 8.7 mg/dL — ABNORMAL LOW (ref 8.9–10.3)
Chloride: 90 mmol/L — ABNORMAL LOW (ref 98–111)
Creatinine, Ser: 0.48 mg/dL — ABNORMAL LOW (ref 0.61–1.24)
GFR, Estimated: >60 mL/min (ref >60)
Glucose, Bld: 177 mg/dL — ABNORMAL HIGH (ref 70–99)
Potassium: 4.2 mmol/L (ref 3.5–5.1)
Sodium: 138 mmol/L (ref 135–145)

## 2022-12-05 LAB — CBC
HCT: 31.5 % — ABNORMAL LOW (ref 39.0–52.0)
Hemoglobin: 9.1 g/dL — ABNORMAL LOW (ref 13.0–17.0)
MCH: 30.8 pg (ref 26.0–34.0)
MCHC: 28.9 g/dL — ABNORMAL LOW (ref 30.0–36.0)
MCV: 106.8 fL — ABNORMAL HIGH (ref 80.0–100.0)
Platelets: 186 10*3/uL (ref 150–400)
RBC: 2.95 MIL/uL — ABNORMAL LOW (ref 4.22–5.81)
RDW: 12.3 % (ref 11.5–15.5)
WBC: 12.5 10*3/uL — ABNORMAL HIGH (ref 4.0–10.5)
nRBC: 0 % (ref 0.0–0.2)

## 2022-12-05 MED ORDER — ORAL CARE MOUTH RINSE: 15.0000 mL | OROMUCOSAL | Status: DC | PRN

## 2022-12-05 MED ORDER — ADULT MULTIVITAMIN W/MINERALS CH
1.0000 | ORAL_TABLET | Freq: Every day | ORAL | Status: DC
  Administered 2022-12-05 – 2022-12-12 (×8): 1 via ORAL
  Filled 2022-12-05 (×8): qty 1

## 2022-12-05 MED ORDER — ENSURE ENLIVE PO LIQD
237.0000 mL | Freq: Three times a day (TID) | ORAL | Status: DC
  Administered 2022-12-05 – 2022-12-12 (×21): 237 mL via ORAL

## 2022-12-05 MED ORDER — HYDROMORPHONE HCL 1 MG/ML IJ SOLN
1.0000 mg | Freq: Once | INTRAMUSCULAR | Status: AC | PRN
  Administered 2022-12-05: 1 mg via INTRAVENOUS
  Filled 2022-12-05: qty 1

## 2022-12-05 NOTE — Progress Notes (Signed)
   12/05/22 1110  Spiritual Encounters  Type of Visit Initial  Care provided to: Patient  Referral source Other (comment) (Spiritual Consult)  Reason for visit Advance directives  OnCall Visit No   Chaplain responded to a spiritual consult request for advanced directive education.  I met with the patient, Wesley [name] and he talked about what is important to him. 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 He has six grandchildren.  Wesley [name] asked that I leave the paperwork with him. His daughter will be in this evening and will work on it. I encouraged him to review the first section,  it provides insight and guidance.  I also advised him that if he or his daughter had any questions to let his nurse know and someone will respond.   Valerie Roys [facility]  [phone]

## 2022-12-05 NOTE — Plan of Care (Signed)
  Problem: Education: Goal: Knowledge of General Education information will improve Description Including pain rating scale, medication(s)/side effects and non-pharmacologic comfort measures Outcome: Progressing   

## 2022-12-05 NOTE — Consult Note (Signed)
Consultation Note Date: 12/05/2022   Patient Name: Wesley [name]  DOB: 1965-10-27  MRN: [id number]  Age / Sex: 57 y.o., male  PCP: Toma Deiters, MD Referring Physician: Cleora Fleet, MD  Reason for Consultation: Establishing goals of care  HPI/Patient Profile: 57 y.o. male  with past medical history of COPD with chronic hypoxia 10 L at home, chronic pain, neuropathy, osteoporosis, asbestos exposure, bilateral hip surgery, 2 back surgeries, admitted on 12/02/2022 with acute on chronic respiratory failure with hypoxia and hypercapnia/acute COPD exacerbation.   Clinical Assessment and Goals of Care: I have reviewed medical records including EPIC notes, labs and imaging, received report from RN, assessed the patient.  Wesley [name], Wesley [name], is sitting on the edge of the bed in the room.  He appears acutely/chronically ill and very frail.  He greets me, making and somewhat keeping eye contact.  He is alert and oriented x 3, able to make his needs known.  There is no family at bedside at this time.  Face-to-face discussion with bedside nursing staff.   We meet at the bedside to discuss diagnosis prognosis, GOC, EOL wishes, disposition and options.  I introduced Palliative Medicine as specialized medical care for people living with serious illness. It focuses on providing relief from the symptoms and stress of a serious illness. The goal is to improve quality of life for both the patient and the family.  We discussed a brief life review of the patient.  Wesley [name] tells me that he has been married to his wife Claris Che for 30+ years.  They share a daughter [place].  He tells me that they also have a son and 6 grandchildren.  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  He shares that he spends most of his day sitting or lying.  His wife manages the household  but he manages their finances.  It seems that he has good support from his daughter.  We then focused on their current illness.  We talk about his respiratory status and oxygenation.  We talked about his COPD and the treatment plan.  Wesley [name] tells me that he is not seeing a pulmonologist at this time.  He shares that his PCP, Dr. Robynn Pane in [place], manages his respiratory medications.  He shares that he feels well-managed with Dr. Bradly Bienenstock the natural disease trajectory and expectations at EOL were discussed.  Advanced directives, concepts specific to code status, artifical feeding and hydration, and rehospitalization were considered and discussed.  We talked about CODE STATUS.  Wesley [name] states that at this point he believes that he would not want attempted resuscitation, no chest compressions.  He is still considering whether he would want any intubation.  He shares that he was on the ventilator for 3 to 4 days approximately 5 years ago.  He states that he is not ready for any decision making at this time, he needs more discussion with his family.  Hospice and Palliative Care services  outpatient were explained and offered.  We talked about the benefits of outpatient palliative services, nurse practitioner visiting once a month.  We also talk in detail about the benefits of at home "treat the treatable" hospice care for symptom management.  Wesley [name] shares that his mother had hospice but only for a few days.  He tells me that he had a friend who died with hospice, but his friends wife and family were resistant at first.  He states that he will consider the benefits of palliative and hospice care at home.  Discussed the importance of continued conversation with family and the medical providers regarding overall plan of care and treatment options, ensuring decisions are within the context of the patient's values and GOCs.  Questions and concerns were addressed.  The patient was encouraged to call with  questions or concerns.  PMT will continue to support holistically.  Conference with attending, bedside nursing staff, transition of care team related to patient condition, needs, goals of care, disposition.   HCPOA  HCPOA -Wesley [name] tells me that he has completed healthcare power of attorney paperwork but feels that he needs a revision.  He would name his daughter, Lowanda Foster, as his healthcare surrogate over his wife Claris Che.    SUMMARY OF RECOMMENDATIONS   This point continue full scope/full code. Time for outcomes. Anticipate home with home health Considering outpatient palliative/hospice services   Code Status/Advance Care Planning: Full code  Symptom Management:  Per hospitalist, no additional needs at this time.  Palliative Prophylaxis:  Frequent Pain Assessment and Oral Care  Additional Recommendations (Limitations, Scope, Preferences): Full Scope Treatment  Psycho-social/Spiritual:  Desire for further Chaplaincy support:yes Additional Recommendations: Caregiving  Support/Resources and Education on Hospice  Prognosis:  Unable to determine, based on outcomes.  End-stage COPD is very difficult for prognostication.  6 months or less would not be surprising.  1 year or more would also not be surprising.  Discharge Planning: Anticipate home with home health      Primary Diagnoses: Present on Admission:  Respiratory failure with hypoxia and hypercapnia (HCC)  Opiate dependence (HCC)  Hypokalemia   I have reviewed the medical record, interviewed the patient and family, and examined the patient. The following aspects are pertinent.  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 in August, 2017   Social History   Socioeconomic History   Marital status: Married    Spouse name: Not on file   Number of children: Not on file   Years of education: Not on file   Highest education level: Not on file  Occupational History   Not on file  Tobacco Use   Smoking status: Every Day    Current packs/day: 0.00    Types: E-cigarettes, Cigarettes    Last attempt to quit: 07/05/2011    Years since quitting: 11.4   Smokeless tobacco: Never   Tobacco comments:    using patches/ e-cigarette per pt  Vaping Use   Vaping status: Never Used  Substance and Sexual Activity   Alcohol use: No   Drug use: No    Types: Oxycodone,  Morphine    Comment: takes these medications as prescribed by Physician for pain!   Sexual activity: Not Currently  Other Topics Concern   Not on file  Social History Narrative   Not on file   Social Determinants of Health   Financial Resource Strain: Not on file  Food Insecurity: No Food Insecurity (12/02/2022)   Hunger Vital Sign    Worried About Running Out of Food in the Last Year: Never true    Ran Out of Food in the Last Year: Never true  Transportation Needs: No Transportation Needs (12/02/2022)   PRAPARE - Administrator, Civil Service (Medical): No    Lack of Transportation (Non-Medical): No  Physical Activity: Not on file  Stress: Not on file  Social Connections: Not on file   Family History  Problem Relation Age of Onset   Heart failure Father    Diabetes Father    Diabetes Mother    Cancer Mother        male cancer   Scheduled Meds:  aspirin  81 mg Oral Q breakfast   Chlorhexidine Gluconate Cloth  6 each Topical Q0600   doxycycline  100 mg Oral Q12H   enoxaparin (LOVENOX) injection  40 mg Subcutaneous Q24H   feeding supplement  237 mL Oral TID BM   furosemide  30 mg Intravenous Daily   ipratropium-albuterol  3 mL Nebulization Q6H   methylPREDNISolone (SOLU-MEDROL) injection  60 mg Intravenous Q12H    mometasone-formoterol  2 puff Inhalation BID   morphine  30 mg Oral Q8H   multivitamin with minerals  1 tablet Oral Daily   pantoprazole  40 mg Oral QAC breakfast   pregabalin  150 mg Oral TID   Continuous Infusions: PRN Meds:.acetaminophen **OR** acetaminophen, albuterol, ipratropium-albuterol, naLOXone (NARCAN)  injection, ondansetron **OR** ondansetron (ZOFRAN) IV, oxycodone Medications Prior to Admission:  Prior to Admission medications   Medication Sig Start Date End Date Taking? Authorizing Provider  acetaminophen (TYLENOL) 325 MG tablet Take 2 tablets by mouth every 4 (four) hours as needed.    Yes [provider]  albuterol (PROVENTIL) (2.5 MG/3ML) 0.083% nebulizer solution Take 3 mLs (2.5 mg total) by nebulization every 6 (six) hours as needed for wheezing or shortness of breath. 07/18/19  Yes Emokpae, Courage, MD  albuterol (VENTOLIN HFA) 108 (90 Base) MCG/ACT inhaler Inhale 2 puffs into the lungs every 4 (four) hours as needed for wheezing or shortness of breath. Shortness of breath 07/18/19  Yes Shon Hale, MD  aspirin 81 MG chewable tablet Chew 1 tablet (81 mg total) by mouth daily with breakfast. 07/18/19  Yes Emokpae, Courage, MD  Fluticasone-Umeclidin-Vilant (TRELEGY ELLIPTA) 200-62.5-25 MCG/ACT AEPB Inhale 1 puff into the lungs daily.   Yes [provider]  morphine (MS CONTIN) 30 MG 12 hr tablet Take 30 mg by mouth every 8 (eight) hours. 12/30/18  Yes [provider]  naloxone HCl (NARCAN) 4 MG/0.1ML LIQD Place 4 mg into the nose as directed. To prevent overdose   Yes [provider]  oxycodone (ROXICODONE) 30 MG immediate release tablet Take 30 mg by mouth every 4 (four) hours as needed (breakthrough pain). 06/12/19  Yes [provider]  OXYGEN Inhale 4-5 L into the lungs continuous.   Yes [provider]  pregabalin (LYRICA) 150 MG capsule Take 150 mg by mouth 3 (three) times daily.  12/30/18  Yes [provider]    Allergies  Allergen Reactions   Ambien [Zolpidem Tartrate] Other (See Comments)  Hallucinations  Altered mental state   Melatonin Itching   Review of Systems  Unable to perform ROS: Other    Physical Exam Vitals and nursing note reviewed.  Constitutional:      General: He is not in acute distress.    Appearance: He is ill-appearing.  Cardiovascular:     Rate and Rhythm: Normal rate.  Pulmonary:     Effort: Pulmonary effort is normal. No respiratory distress.  Skin:    General: Skin is warm and dry.     Findings: Bruising present.  Neurological:     Mental Status: He is alert and oriented to person, place, and time.  Psychiatric:        Mood and Affect: Mood normal.        Behavior: Behavior normal.     Vital Signs: BP (!) 112/44   Pulse (!) 106   Temp 98.8 F (37.1 C) (Axillary)   Resp 18   Ht 6\' 1"  (1.854 m)   Wt 59.4 kg   SpO2 100%   BMI 17.28 kg/m  Pain Scale: 0-10 POSS *See Group Information*: 1-Acceptable,Awake and alert Pain Score: Asleep   SpO2: SpO2: 100 % O2 Device:SpO2: 100 % O2 Flow Rate: .O2 Flow Rate (L/min): 7 L/min  IO: Intake/output summary:  Intake/Output Summary (Last 24 hours) at 12/05/2022 1215 Last data filed at 12/05/2022 1034 Gross per 24 hour  Intake 240 ml  Output 3325 ml  Net -3085 ml    LBM: Last BM Date : 12/04/22 Baseline Weight: Weight: 64.9 kg Most recent weight: Weight: 59.4 kg     Palliative Assessment/Data:     Time In: 0820 Time Out: 0935 Time Total: 75 minutes  Greater than 50%  of this time was spent counseling and coordinating care related to the above assessment and plan.  Signed by: Katheran Awe, NP   Please contact Palliative Medicine Team phone at [phone] for questions and concerns.  For individual provider: See Loretha Stapler

## 2022-12-05 NOTE — Progress Notes (Signed)
Initial Nutrition Assessment  DOCUMENTATION CODES:   Severe malnutrition in context of chronic illness, Underweight  INTERVENTION:   Ensure Plus High Protein po TID, each supplement provides 350 kcal and 20 grams of protein. Provided coupons to patient.  Magic cup TID with meals, each supplement provides 290 kcal and 9 grams of protein. MVI with minerals daily.  NUTRITION DIAGNOSIS:   Severe Malnutrition related to chronic illness (COPD) as evidenced by severe muscle depletion, severe fat depletion.  GOAL:   Patient will meet greater than or equal to 90% of their needs  MONITOR:   PO intake, Supplement acceptance  REASON FOR ASSESSMENT:   Malnutrition Screening Tool    ASSESSMENT:   57 yo male admitted with COPD exacerbation. PMH includes COPD, osteoporosis, neuropathy, avascular necrosis of femur, asbestos exposure.  Patient reports that his appetite is okay. He has been consuming 25-50% of meals since admission. He likes Ensure supplements and agreed to drink 3 per day. Will also add magic cups to meal trays to maximize oral intake of protein and calories. He likes any flavor except chocolate.   Patient states that he used to weigh 185 lbs in December 2023. Currently 130 lbs. 30% weight loss within 10 months is significant. He meets criteria for severe malnutrition with severe weight loss and severe depletion of muscle and subcutaneous fat mass.   Labs reviewed.  Medications reviewed and include Lasix, solumedrol.  NUTRITION - FOCUSED PHYSICAL EXAM:  Flowsheet Row Most Recent Value  Orbital Region Severe depletion  Upper Arm Region Severe depletion  Thoracic and Lumbar Region Severe depletion  Buccal Region Severe depletion  Temple Region Severe depletion  Clavicle Bone Region Severe depletion  Clavicle and Acromion Bone Region Severe depletion  Scapular Bone Region Severe depletion  Dorsal Hand Severe depletion  Patellar Region Unable to assess  Anterior Thigh  Region Unable to assess  Posterior Calf Region Unable to assess  Edema (RD Assessment) Unable to assess  Hair Reviewed  Eyes Reviewed  Mouth Reviewed  Skin Reviewed  Nails Reviewed       Diet Order:   Diet Order             Diet regular Fluid consistency: Thin  Diet effective now                   EDUCATION NEEDS:   Education needs have been addressed  Skin:  Skin Assessment: Reviewed RN Assessment  Last BM:  9/30  Height:   Ht Readings from Last 1 Encounters:  12/03/22 6\' 1"  (1.854 m)    Weight:   Wt Readings from Last 1 Encounters:  12/04/22 59.4 kg    Ideal Body Weight:  83.6 kg  BMI:  Body mass index is 17.28 kg/m.  Estimated Nutritional Needs:   Kcal:  2100-2300  Protein:  100-115 gm  Fluid:  2.1-2.3 L   Gabriel Rainwater RD, LDN, CNSC Please refer to Amion for contact information.

## 2022-12-05 NOTE — TOC Initial Note (Signed)
Transition of Care [facility]) - Initial/Assessment Note    Patient Details  Name: Wesley [name] MRN: [id number] Date of Birth: 1965-07-05  Transition of Care [facility]) CM/SW Contact:    Leitha Bleak, RN Phone Number: 12/05/2022, 1:47 PM  Clinical Narrative:        Patient admitted with Respiratory failure with hypoxia, now high risk for readmission. TOC met with Palliative. Patient lives a home with his wife. Non-ambulatory for 3 years. He uses [employer] home oxygen 6-10L. Palliative meet with patient to discuss Code status and set goals. Patient will discuss with family and Palliative and follow up tomorrow.            Expected Discharge Plan: Home/Self Care Barriers to Discharge: Continued Medical Work up   Patient Goals and CMS Choice Patient states their goals for this hospitalization and ongoing recovery are:: to go home. CMS Medicare.gov Compare Post Acute Care list provided to:: Patient        Expected Discharge Plan and Services      Living arrangements for the past 2 months: Single Family Home                    Prior Living Arrangements/Services Living arrangements for the past 2 months: Single Family Home Lives with:: Spouse       Current home services: DME    Activities of Daily Living   ADL Screening (condition at time of admission) Does the patient have a NEW difficulty with bathing/dressing/toileting/self-feeding that is expected to last >3 days?: Yes (Initiates electronic notice to provider for possible OT consult) Does the patient have a NEW difficulty with getting in/out of bed, walking, or climbing stairs that is expected to last >3 days?: Yes (Initiates electronic notice to provider for possible PT consult) Does the patient have a NEW difficulty with communication that is expected to last >3 days?: No Is the patient deaf or have difficulty hearing?: No Does the patient have difficulty seeing, even when wearing glasses/contacts?: No Does the patient have  difficulty concentrating, remembering, or making decisions?: No  Permission Sought/Granted     Emotional Assessment       Orientation: : Oriented to Self, Oriented to Place, Oriented to Situation Alcohol / Substance Use: Not Applicable Psych Involvement: No (comment)  Admission diagnosis:  Respiratory failure (HCC) [J96.90] Confusion [R41.0] Acute respiratory failure with hypoxia and hypercarbia (HCC) [J96.01, J96.02] Chronic obstructive pulmonary disease, unspecified COPD type (HCC) [J44.9] Patient Active Problem List   Diagnosis Date Noted   Protein-calorie malnutrition, severe 12/05/2022   Respiratory failure with hypoxia and hypercapnia (HCC) 12/02/2022   Protein calorie malnutrition (HCC) 12/02/2022   Acute metabolic encephalopathy 12/02/2022   COPD with exacerbation (HCC) 07/14/2019   Productive cough    Acute exacerbation of chronic obstructive pulmonary disease (COPD) (HCC) 07/01/2019   Acute on chronic respiratory failure (HCC) 06/13/2019   Acute on chronic respiratory failure with hypoxia (HCC) 06/13/2019   Cellulitis of abdominal wall    Leg pain    SOB (shortness of breath) 01/09/2019   Chronic respiratory failure with hypoxia (HCC) 01/09/2019   CAP (community acquired pneumonia) 01/08/2019   Normal coronary arteries 08/02/2016   Atypical chest pain 07/20/2016   Acute coronary syndrome (HCC) 07/19/2016   Abnormal stress test 07/19/2016   Unstable angina (HCC) 07/19/2016   Sepsis (HCC) 03/30/2016   HCAP (healthcare-associated pneumonia) 03/30/2016   Hypokalemia 01/08/2016   History of DVT (deep vein thrombosis) 01/08/2016   OA (osteoarthritis) of hip 09/24/2015  Community acquired pneumonia 04/14/2015   Vomiting and diarrhea    Solitary pulmonary nodule 05/18/2014   Nausea and vomiting 05/16/2014   Bronchitis, acute, with bronchospasm 02/24/2014   Osteoporosis, unspecified 08/06/2012   Hip pain 08/06/2012   Opiate abuse, continuous (HCC) 01/05/2012    Opiate dependence (HCC) 01/03/2012   Chronic respiratory failure (HCC) 01/03/2012   COPD exacerbation (HCC) 01/03/2012   Pulmonary infection 01/03/2012   Pneumonia 07/19/2011   Fever 07/18/2011   Myalgia 07/18/2011   Leukocytosis 07/18/2011   COPD (chronic obstructive pulmonary disease) (HCC) 07/18/2011   Acute-on-chronic respiratory failure (HCC) 07/18/2011   Tachycardia 07/18/2011   Dehydration 07/18/2011   Chronic pain 07/18/2011   Tobacco abuse 07/18/2011   PCP:  Toma Deiters, MD Pharmacy:   [facility] [place], [place] - [address] [place] [address] Phone: [phone] Fax: [phone]     Social Determinants of Health (SDOH) Social History: SDOH Screenings   Food Insecurity: No Food Insecurity (12/02/2022)  Housing: Low Risk  (12/02/2022)  Transportation Needs: No Transportation Needs (12/02/2022)  Utilities: Not At Risk (12/02/2022)  Tobacco Use: High Risk (12/05/2022)   SDOH Interventions:    Readmission Risk Interventions    12/05/2022    1:38 PM  Readmission Risk Prevention Plan  Transportation Screening Complete  PCP or Specialist Appt within 3-5 Days Not Complete  HRI or Home Care Consult Complete  Social Work Consult for Recovery Care Planning/Counseling Complete  Palliative Care Screening Complete  Medication Review Oceanographer) Complete

## 2022-12-05 NOTE — Plan of Care (Signed)

## 2022-12-05 NOTE — Progress Notes (Signed)
PROGRESS NOTE   Wesley [name]  UXL:[id number] DOB: 27-Apr-1965 DOA: 12/02/2022 PCP: Toma Deiters, MD   Chief Complaint  Patient presents with   Weakness   Level of care: Stepdown  Brief Admission History:   57 y.o. male with medical history significant of COPD with chronic hypoxia, on 10 L/min per [place] at home, chronic pain syndrome, neuropathy, and osteoporosis who presented with worsening dyspnea and confusion.    Most information from his wife, at the bedside, patient can not give detail history due to confusion and being on full face mask, bipap.   For the last 5 days patient has been experiencing worsening dyspnea, wheezing and productive cough. His wife noted him to be more confused and somnolent. Very poor oral intake, very weak and deconditioned.  Because worsening symptoms she brought him to the ED.    Patient has advanced COPD, with chronic hypoxia, he has high oxygen requirements at baseline. He has been not ambulatory for many years. He continues to smoke cigarettes.    In the ED he was found in acute hypercapnic respiratory failure and was placed on Bipap.     Assessment and Plan:  Acute on Chronic Respiratory failure with hypoxia and hypercapnia Acute COPD exacerbation   Admitted to step down unit for ongoing bipap therapy  Aggressive bronchodilator therapy. Inhaled and systemic corticosteroids.  RT consultation requested due to complex treatment requirements  continue doxycycline (pt says his PCP has him on this TID but he has not been taking recently) Added lasix 30 mg IV daily on 9/30 Pt insisting on bumping [place] oxygen to 10L/min This places him at high risk for recurrent CO2 narcosis and death, I explained to him and he verbalized understanding but he continues to change oxygen delivery when nurse leaves room Palliative care consultation requested for in depth goals of care discussions CT chest completed 9/30: consistent with chronic findings of advanced  emphysema (updated pt with results on 10/1)   Acute metabolic encephalopathy - resolved CO2 narcosis  Treated initially with bipap when he was acutely encephalopathic Pt now refusing bipap I offered home NIV but he did not seem interested Pt high risk for recurrent CO2 narcosis given refusal to wear bipap consistently, refusal to stop inappropriately increasing oxygen delivery to 10 L despite spO2 being 99% on 5L, he is on high dose opioids, this is a perfect storm for recurrent severe and possibly fatal respiratory failure. I discussed with patient and he verbalized understanding but continues to refuse to change behavior.   Acute HFpEF - I suspect precipitated by acute respiratory failure and poor compliance with furosemide at home - he has been started on IV furosemide and has diuresed more than 3.14 L  - continue IV furosemide 30 mg daily  - monitor daily potassium and sodium  - check Mg in Am   Severe Protein calorie malnutrition Will consult nutrition for recommendations Protein and calorie supplementation ordered    Hypokalemia Repleted   Opiate dependence Chronic pain syndrome.  Patient on high doses of opiate analgesics.  Resumed MS contin TID Pt having aggressive opioid seeking behaviors Pt insisting on IV dilaudid 3-4 mg every 4 hours Because he is refusing to wear bipap, there is no reason to have him on IV opioids Resuming his home pain mgmt regimen, DC IV morphine, resume home oxycodone 30 mg Q4 hours Continue with home pregabalin.   DVT prophylaxis: enoxaparin Code Status: DNR  Family Communication:  Disposition: admit to stepdown ICU  Consultants:   Procedures:  bipap  Antimicrobials:  Doxycycline 9/29>>   Subjective: Aggressive opioid seeking behavior, unable to reason with him regarding oxygen delivery and risk of CO2 narcosis.     Objective: Vitals:   12/05/22 0816 12/05/22 1000 12/05/22 1100 12/05/22 1300  BP:  (!) 100/59 (!) 112/44 (!) 106/47   Pulse:  96 (!) 106 (!) 101  Resp:  19 18 (!) 23  Temp:      TempSrc:      SpO2: 100% 100% 100% 100%  Weight:      Height:        Intake/Output Summary (Last 24 hours) at 12/05/2022 1402 Last data filed at 12/05/2022 1034 Gross per 24 hour  Intake --  Output 2175 ml  Net -2175 ml   Filed Weights   12/02/22 1718 12/03/22 1421 12/04/22 0400  Weight: 64.9 kg 58.7 kg 59.4 kg   Examination:  General exam: severely emaciated chronically ill appearing male  Respiratory system: barreled chest of advanced emphysema. Very poor air movement. Cardiovascular system: normal S1 & S2 heard. No JVD, murmurs, rubs, gallops or clicks. 2+ pedal edema. Gastrointestinal system: Abdomen is nondistended, soft and nontender. No organomegaly or masses felt. Normal bowel sounds heard. Central nervous system: Alert and oriented. No focal neurological deficits. Extremities: 2+ pitting edema BLEs. Symmetric 5 x 5 power. Skin: No rashes, lesions or ulcers. Psychiatry: Judgement and insight appear normal. Mood & affect appropriate.   Data Reviewed: I have personally reviewed following labs and imaging studies  CBC: Recent Labs  Lab 12/02/22 1824 12/03/22 0338 12/05/22 0453  WBC 11.5* 9.7 12.5*  HGB 11.0* 9.9* 9.1*  HCT 38.4* 33.9* 31.5*  MCV 107.0* 106.6* 106.8*  PLT 195 166 186    Basic Metabolic Panel: Recent Labs  Lab 12/02/22 1824 12/03/22 0338 12/05/22 0453  NA 140 139 138  K 3.4* 4.2 4.2  CL 82* 85* 90*  CO2 >45* 45* 37*  GLUCOSE 105* 132* 177*  BUN 14 14 21*  CREATININE 0.35* 0.45* 0.48*  CALCIUM 9.5 8.8* 8.7*  MG  --  1.9  --     CBG: Recent Labs  Lab 12/02/22 1736  GLUCAP 100*    Recent Results (from the past 240 hour(s))  Culture, blood (routine x 2)     Status: None (Preliminary result)   Collection Time: 12/02/22  6:25 PM   Specimen: BLOOD  Result Value Ref Range Status   Specimen Description BLOOD RIGHT ANTECUBITAL  Final   Special Requests   Final    BOTTLES  DRAWN AEROBIC AND ANAEROBIC Blood Culture adequate volume   Culture   Final    NO GROWTH 3 DAYS Performed at [facility], [address] [place], [place] [address]    Report Status PENDING  Incomplete  Culture, blood (routine x 2)     Status: None (Preliminary result)   Collection Time: 12/02/22  7:19 PM   Specimen: BLOOD  Result Value Ref Range Status   Specimen Description BLOOD BLOOD RIGHT HAND  Final   Special Requests   Final    BOTTLES DRAWN AEROBIC AND ANAEROBIC Blood Culture adequate volume   Culture   Final    NO GROWTH 3 DAYS Performed at [facility], [address] [place], [place] [address]    Report Status PENDING  Incomplete  MRSA Next Gen by PCR, Nasal     Status: None   Collection Time: 12/03/22  2:15 PM   Specimen: Nasal Mucosa; Nasal Swab  Result Value Ref Range Status   MRSA by PCR Next Gen NOT DETECTED NOT DETECTED Final    Comment: (NOTE) The GeneXpert MRSA Assay (FDA approved for NASAL specimens only), is one component of a comprehensive MRSA colonization surveillance program. It is not intended to diagnose MRSA infection nor to guide or monitor treatment for MRSA infections. Test performance is not FDA approved in patients less than 28 years old. Performed at [facility], [address] [place], [place] [address]      Radiology Studies: CT CHEST WO CONTRAST  Result Date: 12/04/2022 CLINICAL DATA:  Shortness of breath and cough. EXAM: CT CHEST WITHOUT CONTRAST TECHNIQUE: Multidetector CT imaging of the chest was performed following the standard protocol without IV contrast. RADIATION DOSE REDUCTION: This exam was performed according to the departmental dose-optimization program which includes automated exposure control, adjustment of the mA and/or kV according to patient size and/or use of iterative reconstruction technique. COMPARISON:  Chest CT dated 06/15/2019 and radiograph dated 12/02/2022. FINDINGS: Evaluation of this exam is limited in the absence  of intravenous contrast as well as due to respiratory motion. Cardiovascular: There is no cardiomegaly or pericardial effusion. Mild atherosclerotic calcification of thoracic aorta. No aneurysmal dilatation. There is mild dilatation of the main pulmonary trunk suggestive of pulmonary hypertension. Mediastinum/Nodes: No hilar or mediastinal adenopathy. The esophagus is grossly unremarkable. No mediastinal fluid collection. Lungs/Pleura: Severe emphysema with large biapical subpleural blebs. Postsurgical changes of the lungs with chronic area of scarring in the lingula. Several nodular cystic areas in the right lower lobe measure up to 15 mm, likely chronic. Atypical infection is less likely. No consolidative changes. There is no pleural effusion or pneumothorax. The central airways are patent. Upper Abdomen: No acute findings. Musculoskeletal: No acute osseous pathology. Loss of subcutaneous fat and cachexia. IMPRESSION: 1. Several nodular cystic areas in the right lower lobe, likely chronic. Atypical infection is less likely. 2. Aortic Atherosclerosis (ICD10-I70.0) and Emphysema (ICD10-J43.9). Electronically Signed   By: Elgie Collard M.D.   On: 12/04/2022 23:15    Scheduled Meds:  aspirin  81 mg Oral Q breakfast   Chlorhexidine Gluconate Cloth  6 each Topical Q0600   doxycycline  100 mg Oral Q12H   enoxaparin (LOVENOX) injection  40 mg Subcutaneous Q24H   feeding supplement  237 mL Oral TID BM   furosemide  30 mg Intravenous Daily   ipratropium-albuterol  3 mL Nebulization Q6H   methylPREDNISolone (SOLU-MEDROL) injection  60 mg Intravenous Q12H   mometasone-formoterol  2 puff Inhalation BID   morphine  30 mg Oral Q8H   multivitamin with minerals  1 tablet Oral Daily   pantoprazole  40 mg Oral QAC breakfast   pregabalin  150 mg Oral TID   Continuous Infusions:   LOS: 3 days   Critical Care Procedure Note Authorized and Performed by: Maryln Manuel MD  Total Critical Care time:  58 mins Due to  a high probability of clinically significant, life threatening deterioration, the patient required my highest level of preparedness to intervene emergently and I personally spent this critical care time directly and personally managing the patient.  This critical care time included obtaining a history; examining the patient, pulse oximetry; ordering and review of studies; arranging urgent treatment with development of a management plan; evaluation of patient's response of treatment; frequent reassessment; and discussions with other providers.  This critical care time was performed to assess and manage the high probability of imminent and life threatening deterioration that could result  in multi-organ failure.  It was exclusive of separately billable procedures and treating other patients and teaching time.   Standley Dakins, MD How to contact the [facility] Attending or Consulting provider 7A - 7P or covering provider during after hours 7P -7A, for this patient?  Check the care team in [facility] and look for a) attending/consulting TRH provider listed and b) the [facility] team listed Log into [email or web address] and use 's universal password to access. If you do not have the password, please contact the hospital operator. Locate the [facility] provider you are looking for under [facility] and page to a number that you can be directly reached. If you still have difficulty reaching the provider, please page the [facility] (Director on Call) for the Hospitalists listed on amion for assistance.  12/05/2022, 2:02 PM

## 2022-12-05 NOTE — Progress Notes (Signed)
Patient still declines BIPAP. Unit still at bedside if needed or he changes his mind. Currently on 9 lpm nasal cannula with 100% O2 sat. No distress noted breath sounds still wheezing and diminished bilaterally.

## 2022-12-06 DIAGNOSIS — Z7189 Other specified counseling: Secondary | ICD-10-CM | POA: Diagnosis not present

## 2022-12-06 DIAGNOSIS — Z515 Encounter for palliative care: Secondary | ICD-10-CM | POA: Diagnosis not present

## 2022-12-06 DIAGNOSIS — J9621 Acute and chronic respiratory failure with hypoxia: Secondary | ICD-10-CM | POA: Diagnosis not present

## 2022-12-06 DIAGNOSIS — J9622 Acute and chronic respiratory failure with hypercapnia: Secondary | ICD-10-CM | POA: Diagnosis not present

## 2022-12-06 LAB — BASIC METABOLIC PANEL
Anion gap: 8 (ref 5–15)
BUN: 20 mg/dL (ref 6–20)
CO2: 42 mmol/L — ABNORMAL HIGH (ref 22–32)
Calcium: 8.7 mg/dL — ABNORMAL LOW (ref 8.9–10.3)
Chloride: 86 mmol/L — ABNORMAL LOW (ref 98–111)
Creatinine, Ser: 0.5 mg/dL — ABNORMAL LOW (ref 0.61–1.24)
GFR, Estimated: >60 mL/min (ref >60)
Glucose, Bld: 117 mg/dL — ABNORMAL HIGH (ref 70–99)
Potassium: 3.8 mmol/L (ref 3.5–5.1)
Sodium: 136 mmol/L (ref 135–145)

## 2022-12-06 LAB — MAGNESIUM: Magnesium: 2.1 mg/dL (ref 1.7–2.4)

## 2022-12-06 MED ORDER — NICOTINE 14 MG/24HR TD PT24
14.0000 mg | MEDICATED_PATCH | Freq: Every day | TRANSDERMAL | Status: DC
  Administered 2022-12-06 – 2022-12-12 (×7): 14 mg via TRANSDERMAL
  Filled 2022-12-06 (×7): qty 1

## 2022-12-06 NOTE — Plan of Care (Signed)

## 2022-12-06 NOTE — Progress Notes (Signed)
Palliative: Mr. Wesley [name], Wesley [name], is standing up at bedside in his room. He appears chronically ill and frail.  He greets me, making and mostly keeping eye contact.  He is alert and oriented, able to make his needs known.  There is no family at bedside at this time.  We talked about his acute health concerns and the treatment plan.  We talked about his respiratory status and his chronic pain.  We talk in detail about the benefits of at home "treat the treatable" hospice care.  Mr. Wesley [name] continues to have appropriate questions around hospice care.  I encouraged him to talk with family about these choices and if possible, meet with the hospice representative to hear from them how they would best to be able to serve Mr. Wesley [name] and his family.  PMT to continue to follow.  Face-to-face conference with bedside nursing staff and transition of care related to patient condition, needs, goals of care.  Plan: At this point continue to treat the treatable but no CPR or intubation.  Time for outcomes.  Anticipate home with home health if qualified.  Considering outpatient palliative/hospice care for "treat the treatable" care.  PMT to follow.  35 minutes Lillia Carmel, NP Palliative medicine team Team phone [phone]

## 2022-12-06 NOTE — Progress Notes (Signed)
PROGRESS NOTE    Wesley [name]  UJW:[id number] DOB: 19-May-1965 DOA: 12/02/2022 PCP: Toma Deiters, MD   Brief Narrative:   57 y.o. male with medical history significant of COPD with chronic hypoxia, on 10 L/min per [place] at home, chronic pain syndrome, neuropathy, and osteoporosis who presented with worsening dyspnea and confusion.    Most information from his wife, at the bedside, patient can not give detail history due to confusion and being on full face mask, bipap.   For the last 5 days patient has been experiencing worsening dyspnea, wheezing and productive cough. His wife noted him to be more confused and somnolent. Very poor oral intake, very weak and deconditioned.  Because worsening symptoms she brought him to the ED.    Patient has advanced COPD, with chronic hypoxia, he has high oxygen requirements at baseline. He has been not ambulatory for many years. He continues to smoke cigarettes.    In the ED he was found in acute hypercapnic respiratory failure and was placed on Bipap.      Assessment & Plan:   Principal Problem:   Respiratory failure with hypoxia and hypercapnia (HCC) Active Problems:   Acute metabolic encephalopathy   Opiate dependence (HCC)   Protein calorie malnutrition (HCC)   Hypokalemia   Protein-calorie malnutrition, severe   Acute heart failure with preserved ejection fraction (HFpEF) (HCC)  Assessment and Plan:  Acute on Chronic Respiratory failure with hypoxia and hypercapnia Acute COPD exacerbation    Admitted to step down unit for ongoing bipap therapy  Aggressive bronchodilator therapy. Inhaled and systemic corticosteroids.  RT consultation requested due to complex treatment requirements  continue doxycycline (pt says his PCP has him on this TID but he has not been taking recently) Added lasix 30 mg IV daily on 9/30 Pt insisting on bumping [place] oxygen to 10L/min This places him at high risk for recurrent CO2 narcosis and death, I explained  to him and he verbalized understanding but he continues to change oxygen delivery when nurse leaves room Palliative care consultation requested for in depth goals of care discussions CT chest completed 9/30: consistent with chronic findings of advanced emphysema (updated pt with results on 10/1)    Acute metabolic encephalopathy - resolved CO2 narcosis  Treated initially with bipap when he was acutely encephalopathic Pt now refusing bipap I offered home NIV but he did not seem interested Pt high risk for recurrent CO2 narcosis given refusal to wear bipap consistently, refusal to stop inappropriately increasing oxygen delivery to 10 L despite spO2 being 99% on 5L, he is on high dose opioids, this is a perfect storm for recurrent severe and possibly fatal respiratory failure. I discussed with patient and he verbalized understanding but continues to refuse to change behavior.    Acute HFpEF - I suspect precipitated by acute respiratory failure and poor compliance with furosemide at home - he has been started on IV furosemide and has diuresed more than 3.14 L  - continue IV furosemide 30 mg daily  - monitor daily potassium and sodium  - check Mg in Am    Severe Protein calorie malnutrition Will consult nutrition for recommendations Protein and calorie supplementation ordered     Opiate dependence Chronic pain syndrome.  Patient on high doses of opiate analgesics.  Resumed MS contin TID Pt having aggressive opioid seeking behaviors Pt insisting on IV dilaudid 3-4 mg every 4 hours Because he is refusing to wear bipap, there is no reason to have him on  IV opioids Resuming his home pain mgmt regimen, DC IV morphine, resume home oxycodone 30 mg Q4 hours Continue with home pregabalin.     DVT prophylaxis:Lovenox Code Status: DNR Family Communication: None at bedside Disposition Plan:  Status is: Inpatient Remains inpatient appropriate because: Need for ongoing IV  medications.   Consultants:  Palliative  Procedures:  None  Antimicrobials:  Anti-infectives (From admission, onward)    Start     Dose/Rate Route Frequency Ordered Stop   12/04/22 1000  doxycycline (VIBRA-TABS) tablet 100 mg        100 mg Oral Every 12 hours 12/04/22 0914     12/03/22 1100  doxycycline (VIBRAMYCIN) 100 mg in sodium chloride 0.9 % 250 mL IVPB  Status:  Discontinued        100 mg 125 mL/hr over 120 Minutes Intravenous Every 12 hours 12/03/22 1032 12/04/22 0914      Subjective: Patient seen and evaluated today with no significant improvement overnight.  He continues to require high amounts of nasal cannula oxygen.  He continues to have cough and significant sputum production.  Objective: Vitals:   12/06/22 0901 12/06/22 1000 12/06/22 1044 12/06/22 1100  BP:  118/71  102/70  Pulse:  (!) 103  (!) 108  Resp:  (!) 23  16  Temp:   98.3 F (36.8 C)   TempSrc:   Oral   SpO2: 97% 99%  100%  Weight:      Height:        Intake/Output Summary (Last 24 hours) at 12/06/2022 1148 Last data filed at 12/06/2022 1051 Gross per 24 hour  Intake 1800 ml  Output 2525 ml  Net -725 ml   Filed Weights   12/03/22 1421 12/04/22 0400 12/05/22 2200  Weight: 58.7 kg 59.4 kg 69.2 kg    Examination:  General exam: Appears calm and comfortable  Respiratory system: Clear to auscultation. Respiratory effort normal.  9 L nasal cannula oxygen. Cardiovascular system: S1 & S2 heard, RRR.  Gastrointestinal system: Abdomen is soft Central nervous system: Alert and awake Extremities: Significant 1-2+ pitting edema bilaterally. Skin: No significant lesions noted Psychiatry: Flat affect.    Data Reviewed: I have personally reviewed following labs and imaging studies  CBC: Recent Labs  Lab 12/02/22 1824 12/03/22 0338 12/05/22 0453  WBC 11.5* 9.7 12.5*  HGB 11.0* 9.9* 9.1*  HCT 38.4* 33.9* 31.5*  MCV 107.0* 106.6* 106.8*  PLT 195 166 186   Basic Metabolic Panel: Recent  Labs  Lab 12/02/22 1824 12/03/22 0338 12/05/22 0453 12/06/22 0503  NA 140 139 138 136  K 3.4* 4.2 4.2 3.8  CL 82* 85* 90* 86*  CO2 >45* 45* 37* 42*  GLUCOSE 105* 132* 177* 117*  BUN 14 14 21* 20  CREATININE 0.35* 0.45* 0.48* 0.50*  CALCIUM 9.5 8.8* 8.7* 8.7*  MG  --  1.9  --  2.1   GFR: Estimated Creatinine Clearance: 97.3 mL/min (A) (by C-G formula based on SCr of 0.5 mg/dL (L)). Liver Function Tests: Recent Labs  Lab 12/02/22 1824  AST 16  ALT 16  ALKPHOS 73  BILITOT 0.2*  PROT 7.7  ALBUMIN 4.2   No results for input(s): "LIPASE", "AMYLASE" in the last 168 hours. Recent Labs  Lab 12/02/22 1824  AMMONIA 24   Coagulation Profile: No results for input(s): "INR", "PROTIME" in the last 168 hours. Cardiac Enzymes: No results for input(s): "CKTOTAL", "CKMB", "CKMBINDEX", "TROPONINI" in the last 168 hours. BNP (last 3 results) No results for input(s): "  PROBNP" in the last 8760 hours. HbA1C: No results for input(s): "HGBA1C" in the last 72 hours. CBG: Recent Labs  Lab 12/02/22 1736  GLUCAP 100*   Lipid Profile: No results for input(s): "CHOL", "HDL", "LDLCALC", "TRIG", "CHOLHDL", "LDLDIRECT" in the last 72 hours. Thyroid Function Tests: No results for input(s): "TSH", "T4TOTAL", "FREET4", "T3FREE", "THYROIDAB" in the last 72 hours. Anemia Panel: No results for input(s): "VITAMINB12", "FOLATE", "FERRITIN", "TIBC", "IRON", "RETICCTPCT" in the last 72 hours. Sepsis Labs: No results for input(s): "PROCALCITON", "LATICACIDVEN" in the last 168 hours.  Recent Results (from the past 240 hour(s))  Culture, blood (routine x 2)     Status: None (Preliminary result)   Collection Time: 12/02/22  6:25 PM   Specimen: BLOOD  Result Value Ref Range Status   Specimen Description BLOOD RIGHT ANTECUBITAL  Final   Special Requests   Final    BOTTLES DRAWN AEROBIC AND ANAEROBIC Blood Culture adequate volume   Culture   Final    NO GROWTH 4 DAYS Performed at [facility],  [address], [place], [place] [address]    Report Status PENDING  Incomplete  Culture, blood (routine x 2)     Status: None (Preliminary result)   Collection Time: 12/02/22  7:19 PM   Specimen: BLOOD  Result Value Ref Range Status   Specimen Description BLOOD BLOOD RIGHT HAND  Final   Special Requests   Final    BOTTLES DRAWN AEROBIC AND ANAEROBIC Blood Culture adequate volume   Culture   Final    NO GROWTH 4 DAYS Performed at [facility], [address], [place], [place] [address]    Report Status PENDING  Incomplete  MRSA Next Gen by PCR, Nasal     Status: None   Collection Time: 12/03/22  2:15 PM   Specimen: Nasal Mucosa; Nasal Swab  Result Value Ref Range Status   MRSA by PCR Next Gen NOT DETECTED NOT DETECTED Final    Comment: (NOTE) The GeneXpert MRSA Assay (FDA approved for NASAL specimens only), is one component of a comprehensive MRSA colonization surveillance program. It is not intended to diagnose MRSA infection nor to guide or monitor treatment for MRSA infections. Test performance is not FDA approved in patients less than 70 years old. Performed at [facility], [address], [place], [place] [address]          Radiology Studies: CT CHEST WO CONTRAST  Result Date: 12/04/2022 CLINICAL DATA:  Shortness of breath and cough. EXAM: CT CHEST WITHOUT CONTRAST TECHNIQUE: Multidetector CT imaging of the chest was performed following the standard protocol without IV contrast. RADIATION DOSE REDUCTION: This exam was performed according to the departmental dose-optimization program which includes automated exposure control, adjustment of the mA and/or kV according to patient size and/or use of iterative reconstruction technique. COMPARISON:  Chest CT dated 06/15/2019 and radiograph dated 12/02/2022. FINDINGS: Evaluation of this exam is limited in the absence of intravenous contrast as well as due to respiratory motion. Cardiovascular: There is no cardiomegaly or pericardial  effusion. Mild atherosclerotic calcification of thoracic aorta. No aneurysmal dilatation. There is mild dilatation of the main pulmonary trunk suggestive of pulmonary hypertension. Mediastinum/Nodes: No hilar or mediastinal adenopathy. The esophagus is grossly unremarkable. No mediastinal fluid collection. Lungs/Pleura: Severe emphysema with large biapical subpleural blebs. Postsurgical changes of the lungs with chronic area of scarring in the lingula. Several nodular cystic areas in the right lower lobe measure up to 15 mm, likely chronic. Atypical infection is less likely. No  consolidative changes. There is no pleural effusion or pneumothorax. The central airways are patent. Upper Abdomen: No acute findings. Musculoskeletal: No acute osseous pathology. Loss of subcutaneous fat and cachexia. IMPRESSION: 1. Several nodular cystic areas in the right lower lobe, likely chronic. Atypical infection is less likely. 2. Aortic Atherosclerosis (ICD10-I70.0) and Emphysema (ICD10-J43.9). Electronically Signed   By: Elgie Collard M.D.   On: 12/04/2022 23:15        Scheduled Meds:  aspirin  81 mg Oral Q breakfast   Chlorhexidine Gluconate Cloth  6 each Topical Q0600   doxycycline  100 mg Oral Q12H   enoxaparin (LOVENOX) injection  40 mg Subcutaneous Q24H   feeding supplement  237 mL Oral TID BM   furosemide  30 mg Intravenous Daily   ipratropium-albuterol  3 mL Nebulization Q6H   methylPREDNISolone (SOLU-MEDROL) injection  60 mg Intravenous Q12H   mometasone-formoterol  2 puff Inhalation BID   morphine  30 mg Oral Q8H   multivitamin with minerals  1 tablet Oral Daily   pantoprazole  40 mg Oral QAC breakfast   pregabalin  150 mg Oral TID    LOS: 4 days    Time spent: 35 minutes    Endy Easterly Hoover Brunette, DO [facility]  If 7PM-7AM, please contact night-coverage [email or web address] 12/06/2022, 11:48 AM

## 2022-12-07 DIAGNOSIS — J9621 Acute and chronic respiratory failure with hypoxia: Secondary | ICD-10-CM | POA: Diagnosis not present

## 2022-12-07 DIAGNOSIS — J9622 Acute and chronic respiratory failure with hypercapnia: Secondary | ICD-10-CM | POA: Diagnosis not present

## 2022-12-07 LAB — BASIC METABOLIC PANEL
Anion gap: 11 (ref 5–15)
BUN: 26 mg/dL — ABNORMAL HIGH (ref 6–20)
CO2: 40 mmol/L — ABNORMAL HIGH (ref 22–32)
Calcium: 8.9 mg/dL (ref 8.9–10.3)
Chloride: 87 mmol/L — ABNORMAL LOW (ref 98–111)
Creatinine, Ser: 0.45 mg/dL — ABNORMAL LOW (ref 0.61–1.24)
GFR, Estimated: >60 mL/min (ref >60)
Glucose, Bld: 109 mg/dL — ABNORMAL HIGH (ref 70–99)
Potassium: 4.8 mmol/L (ref 3.5–5.1)
Sodium: 138 mmol/L (ref 135–145)

## 2022-12-07 LAB — CBC
HCT: 34.9 % — ABNORMAL LOW (ref 39.0–52.0)
Hemoglobin: 9.9 g/dL — ABNORMAL LOW (ref 13.0–17.0)
MCH: 30.7 pg (ref 26.0–34.0)
MCHC: 28.4 g/dL — ABNORMAL LOW (ref 30.0–36.0)
MCV: 108 fL — ABNORMAL HIGH (ref 80.0–100.0)
Platelets: 195 10*3/uL (ref 150–400)
RBC: 3.23 MIL/uL — ABNORMAL LOW (ref 4.22–5.81)
RDW: 12.2 % (ref 11.5–15.5)
WBC: 11.1 10*3/uL — ABNORMAL HIGH (ref 4.0–10.5)
nRBC: 0 % (ref 0.0–0.2)

## 2022-12-07 LAB — MAGNESIUM: Magnesium: 2.1 mg/dL (ref 1.7–2.4)

## 2022-12-07 LAB — CULTURE, BLOOD (ROUTINE X 2)
Special Requests: ADEQUATE
Special Requests: ADEQUATE

## 2022-12-07 MED ORDER — ACETYLCYSTEINE 20 % IN SOLN
3.0000 mL | Freq: Two times a day (BID) | RESPIRATORY_TRACT | Status: DC
  Administered 2022-12-07 – 2022-12-09 (×6): 3 mL via RESPIRATORY_TRACT
  Administered 2022-12-10: 4 mL via RESPIRATORY_TRACT
  Administered 2022-12-10: 3 mL via RESPIRATORY_TRACT
  Filled 2022-12-07 (×8): qty 4

## 2022-12-07 MED ORDER — FUROSEMIDE 10 MG/ML IJ SOLN
30.0000 mg | Freq: Two times a day (BID) | INTRAMUSCULAR | Status: DC
  Administered 2022-12-07 (×2): 30 mg via INTRAVENOUS
  Filled 2022-12-07 (×3): qty 4

## 2022-12-07 NOTE — Progress Notes (Signed)
PROGRESS NOTE    Wesley [name]  WUJ:[id number] DOB: [birth year] DOA: 12/02/2022 PCP: Toma Deiters, MD   Brief Narrative:   57 y.o. male with medical history significant of COPD with chronic hypoxia, on 10 L/min per [place] at home, chronic pain syndrome, neuropathy, and osteoporosis who presented with worsening dyspnea and confusion.    Most information from his wife, at the bedside, patient can not give detail history due to confusion and being on full face mask, bipap.   For the last 5 days patient has been experiencing worsening dyspnea, wheezing and productive cough. His wife noted him to be more confused and somnolent. Very poor oral intake, very weak and deconditioned.  Because worsening symptoms she brought him to the ED.    Patient has advanced COPD, with chronic hypoxia, he has high oxygen requirements at baseline. He has been not ambulatory for many years. He continues to smoke cigarettes.    In the ED he was found in acute hypercapnic respiratory failure and was placed on Bipap.      Assessment & Plan:   Principal Problem:   Respiratory failure with hypoxia and hypercapnia (HCC) Active Problems:   Acute metabolic encephalopathy   Opiate dependence (HCC)   Protein calorie malnutrition (HCC)   Hypokalemia   Protein-calorie malnutrition, severe   Acute heart failure with preserved ejection fraction (HFpEF) (HCC)  Assessment and Plan:  Acute on Chronic Respiratory failure with hypoxia and hypercapnia Acute COPD exacerbation    Admitted to step down unit for ongoing bipap therapy  Aggressive bronchodilator therapy. Inhaled and systemic corticosteroids.  RT consultation requested due to complex treatment requirements  continue doxycycline (pt says his PCP has him on this TID but he has not been taking recently) Added lasix 30 mg IV daily on 9/30, increased to twice daily dosing 10/3 Pt insisting on bumping [place] oxygen to 10L/min This places him at high risk for  recurrent CO2 narcosis and death, I explained to him and he verbalized understanding but he continues to change oxygen delivery when nurse leaves room Palliative care consultation requested for in depth goals of care discussions CT chest completed 9/30: consistent with chronic findings of advanced emphysema (updated pt with results on 10/1)  Mucom[place] added 10/3 to assist with chest congestion   Acute metabolic encephalopathy - resolved CO2 narcosis  Treated initially with bipap when he was acutely encephalopathic Pt now refusing bipap I offered home NIV but he did not seem interested Pt high risk for recurrent CO2 narcosis given refusal to wear bipap consistently, refusal to stop inappropriately increasing oxygen delivery to 10 L despite spO2 being 99% on 5L, he is on high dose opioids, this is a perfect storm for recurrent severe and possibly fatal respiratory failure. I discussed with patient and he verbalized understanding but continues to refuse to change behavior.    Acute HFpEF - I suspect precipitated by acute respiratory failure and poor compliance with furosemide at home - he has been started on IV furosemide and has diuresed more than 3.14 L  -Change IV Lasix to twice daily dosing - monitor daily potassium and sodium  - check Mg in Am    Severe Protein calorie malnutrition Will consult nutrition for recommendations Protein and calorie supplementation ordered     Opiate dependence Chronic pain syndrome.  Patient on high doses of opiate analgesics.  Resumed MS contin TID Pt having aggressive opioid seeking behaviors Pt insisting on IV dilaudid 3-4 mg every 4 hours Because he  is refusing to wear bipap, there is no reason to have him on IV opioids Resuming his home pain mgmt regimen, DC IV morphine, resume home oxycodone 30 mg Q4 hours Continue with home pregabalin.     DVT prophylaxis:Lovenox Code Status: DNR Family Communication: None at bedside Disposition Plan:  Status  is: Inpatient Remains inpatient appropriate because: Need for ongoing IV medications.   Consultants:  Palliative  Procedures:  None  Antimicrobials:  Anti-infectives (From admission, onward)    Start     Dose/Rate Route Frequency Ordered Stop   12/04/22 1000  doxycycline (VIBRA-TABS) tablet 100 mg        100 mg Oral Every 12 hours 12/04/22 0914     12/03/22 1100  doxycycline (VIBRAMYCIN) 100 mg in sodium chloride 0.9 % 250 mL IVPB  Status:  Discontinued        100 mg 125 mL/hr over 120 Minutes Intravenous Every 12 hours 12/03/22 1032 12/04/22 0914      Subjective: Patient seen and evaluated today with no significant improvement overnight.  He continues to require high amounts of nasal cannula oxygen.  He continues to have cough and significant sputum production.  Objective: Vitals:   12/07/22 0610 12/07/22 0635 12/07/22 0735 12/07/22 0820  BP:      Pulse:  63  (!) 102  Resp:  15  20  Temp:   98.1 F (36.7 C)   TempSrc:   Axillary   SpO2: 98% 95%  99%  Weight:      Height:        Intake/Output Summary (Last 24 hours) at 12/07/2022 1020 Last data filed at 12/07/2022 1478 Gross per 24 hour  Intake 480 ml  Output 2125 ml  Net -1645 ml   Filed Weights   12/03/22 1421 12/04/22 0400 12/05/22 2200  Weight: 58.7 kg 59.4 kg 69.2 kg    Examination:  General exam: Appears calm and comfortable  Respiratory system: Clear to auscultation. Respiratory effort normal.  10 L nasal cannula oxygen. Cardiovascular system: S1 & S2 heard, RRR.  Gastrointestinal system: Abdomen is soft Central nervous system: Alert and awake Extremities: Significant 1-2+ pitting edema bilaterally. Skin: No significant lesions noted Psychiatry: Flat affect.    Data Reviewed: I have personally reviewed following labs and imaging studies  CBC: Recent Labs  Lab 12/02/22 1824 12/03/22 0338 12/05/22 0453 12/07/22 0316  WBC 11.5* 9.7 12.5* 11.1*  HGB 11.0* 9.9* 9.1* 9.9*  HCT 38.4* 33.9*  31.5* 34.9*  MCV 107.0* 106.6* 106.8* 108.0*  PLT 195 166 186 195   Basic Metabolic Panel: Recent Labs  Lab 12/02/22 1824 12/03/22 0338 12/05/22 0453 12/06/22 0503 12/07/22 0316  NA 140 139 138 136 138  K 3.4* 4.2 4.2 3.8 4.8  CL 82* 85* 90* 86* 87*  CO2 >45* 45* 37* 42* 40*  GLUCOSE 105* 132* 177* 117* 109*  BUN 14 14 21* 20 26*  CREATININE 0.35* 0.45* 0.48* 0.50* 0.45*  CALCIUM 9.5 8.8* 8.7* 8.7* 8.9  MG  --  1.9  --  2.1 2.1   GFR: Estimated Creatinine Clearance: 97.3 mL/min (A) (by C-G formula based on SCr of 0.45 mg/dL (L)). Liver Function Tests: Recent Labs  Lab 12/02/22 1824  AST 16  ALT 16  ALKPHOS 73  BILITOT 0.2*  PROT 7.7  ALBUMIN 4.2   No results for input(s): "LIPASE", "AMYLASE" in the last 168 hours. Recent Labs  Lab 12/02/22 1824  AMMONIA 24   Coagulation Profile: No results for input(s): "INR", "  PROTIME" in the last 168 hours. Cardiac Enzymes: No results for input(s): "CKTOTAL", "CKMB", "CKMBINDEX", "TROPONINI" in the last 168 hours. BNP (last 3 results) No results for input(s): "PROBNP" in the last 8760 hours. HbA1C: No results for input(s): "HGBA1C" in the last 72 hours. CBG: Recent Labs  Lab 12/02/22 1736  GLUCAP 100*   Lipid Profile: No results for input(s): "CHOL", "HDL", "LDLCALC", "TRIG", "CHOLHDL", "LDLDIRECT" in the last 72 hours. Thyroid Function Tests: No results for input(s): "TSH", "T4TOTAL", "FREET4", "T3FREE", "THYROIDAB" in the last 72 hours. Anemia Panel: No results for input(s): "VITAMINB12", "FOLATE", "FERRITIN", "TIBC", "IRON", "RETICCTPCT" in the last 72 hours. Sepsis Labs: No results for input(s): "PROCALCITON", "LATICACIDVEN" in the last 168 hours.  Recent Results (from the past 240 hour(s))  Culture, blood (routine x 2)     Status: None   Collection Time: 12/02/22  6:25 PM   Specimen: BLOOD  Result Value Ref Range Status   Specimen Description BLOOD RIGHT ANTECUBITAL  Final   Special Requests   Final     BOTTLES DRAWN AEROBIC AND ANAEROBIC Blood Culture adequate volume   Culture   Final    NO GROWTH 5 DAYS Performed at [facility], [address] [place], [place] [address]    Report Status 12/07/2022 FINAL  Final  Culture, blood (routine x 2)     Status: None   Collection Time: 12/02/22  7:19 PM   Specimen: BLOOD  Result Value Ref Range Status   Specimen Description BLOOD BLOOD RIGHT HAND  Final   Special Requests   Final    BOTTLES DRAWN AEROBIC AND ANAEROBIC Blood Culture adequate volume   Culture   Final    NO GROWTH 5 DAYS Performed at [facility], [address] [place], [place] [address]    Report Status 12/07/2022 FINAL  Final  MRSA Next Gen by PCR, Nasal     Status: None   Collection Time: 12/03/22  2:15 PM   Specimen: Nasal Mucosa; Nasal Swab  Result Value Ref Range Status   MRSA by PCR Next Gen NOT DETECTED NOT DETECTED Final    Comment: (NOTE) The GeneXpert MRSA Assay (FDA approved for NASAL specimens only), is one component of a comprehensive MRSA colonization surveillance program. It is not intended to diagnose MRSA infection nor to guide or monitor treatment for MRSA infections. Test performance is not FDA approved in patients less than 59 years old. Performed at [facility], [address], [place], [place] [address]          Radiology Studies: No results found.      Scheduled Meds:  acetylcysteine  3 mL Nebulization BID   aspirin  81 mg Oral Q breakfast   Chlorhexidine Gluconate Cloth  6 each Topical Q0600   doxycycline  100 mg Oral Q12H   enoxaparin (LOVENOX) injection  40 mg Subcutaneous Q24H   feeding supplement  237 mL Oral TID BM   furosemide  30 mg Intravenous Q12H   ipratropium-albuterol  3 mL Nebulization Q6H   methylPREDNISolone (SOLU-MEDROL) injection  60 mg Intravenous Q12H   mometasone-formoterol  2 puff Inhalation BID   morphine  30 mg Oral Q8H   multivitamin with minerals  1 tablet Oral Daily   nicotine  14 mg Transdermal Daily    pantoprazole  40 mg Oral QAC breakfast   pregabalin  150 mg Oral TID    LOS: 5 days    Time spent: 35 minutes    Zionna Homewood Hoover Brunette, DO [facility]  If 7PM-7AM, please contact night-coverage [email or web address] 12/07/2022, 10:20 AM

## 2022-12-07 NOTE — Plan of Care (Signed)

## 2022-12-07 NOTE — Discharge Instructions (Signed)
Nutrition Post Hospital Stay °Proper nutrition can help your body recover from illness and injury.   °Foods and beverages high in protein, vitamins, and minerals help rebuild muscle loss, promote healing, & reduce fall risk.  ° °•In addition to eating healthy foods, a nutrition shake is an easy, delicious way to get the nutrition you need during and after your hospital stay ° °It is recommended that you continue to drink 2 bottles per day of:       Ensure Plus for at least 1 month (30 days) after your hospital stay  ° °Tips for adding a nutrition shake into your routine: °As allowed, drink one with vitamins or medications instead of water or juice °Enjoy one as a tasty mid-morning or afternoon snack °Drink cold or make a milkshake out of it °Drink one instead of milk with cereal or snacks °Use as a coffee creamer °  °Available at the following grocery stores and pharmacies:           °* Wesley [name] * [employer] * [employer]  °* [facility] [facility]          * [employer] * [employer]  °* [facility]      * Target  * [employer]   °* [facility]  * [employer]   °* [facility] [phone]  °          °For COUPONS visit: www.ensure.com/join or www.[email or web address]/members/sign-up  ° °Suggested Substitutions °Ensure Plus = Boost Plus = Carnation Breakfast Essentials = Boost Compact °Ensure Active Clear = Boost Breeze °Glucerna Shake = Boost Glucose Control = Carnation Breakfast Essentials SUGAR FREE ° °  °

## 2022-12-08 DIAGNOSIS — J9621 Acute and chronic respiratory failure with hypoxia: Secondary | ICD-10-CM | POA: Diagnosis not present

## 2022-12-08 DIAGNOSIS — J9622 Acute and chronic respiratory failure with hypercapnia: Secondary | ICD-10-CM | POA: Diagnosis not present

## 2022-12-08 LAB — BASIC METABOLIC PANEL
Anion gap: 7 (ref 5–15)
BUN: 25 mg/dL — ABNORMAL HIGH (ref 6–20)
CO2: 45 mmol/L — ABNORMAL HIGH (ref 22–32)
Calcium: 8.9 mg/dL (ref 8.9–10.3)
Chloride: 82 mmol/L — ABNORMAL LOW (ref 98–111)
Creatinine, Ser: 0.55 mg/dL — ABNORMAL LOW (ref 0.61–1.24)
GFR, Estimated: >60 mL/min (ref >60)
Glucose, Bld: 170 mg/dL — ABNORMAL HIGH (ref 70–99)
Potassium: 4.6 mmol/L (ref 3.5–5.1)
Sodium: 134 mmol/L — ABNORMAL LOW (ref 135–145)

## 2022-12-08 LAB — MAGNESIUM: Magnesium: 2.2 mg/dL (ref 1.7–2.4)

## 2022-12-08 MED ORDER — METHYLPREDNISOLONE SODIUM SUCC 40 MG IJ SOLR
40.0000 mg | Freq: Two times a day (BID) | INTRAMUSCULAR | Status: DC
  Administered 2022-12-08 – 2022-12-11 (×6): 40 mg via INTRAVENOUS
  Filled 2022-12-08 (×6): qty 1

## 2022-12-08 MED ORDER — FUROSEMIDE 40 MG PO TABS
40.0000 mg | ORAL_TABLET | Freq: Two times a day (BID) | ORAL | Status: DC
  Administered 2022-12-08 (×2): 40 mg via ORAL
  Filled 2022-12-08 (×3): qty 1

## 2022-12-08 NOTE — Plan of Care (Signed)
  Problem: Education: Goal: Knowledge of General Education information will improve Description: Including pain rating scale, medication(s)/side effects and non-pharmacologic comfort measures Outcome: Progressing   Problem: Clinical Measurements: Goal: Ability to maintain clinical measurements within normal limits will improve Outcome: Progressing Goal: Respiratory complications will improve Outcome: Progressing   Problem: Activity: Goal: Risk for activity intolerance will decrease Outcome: Progressing   Problem: Nutrition: Goal: Adequate nutrition will be maintained Outcome: Progressing   

## 2022-12-08 NOTE — Care Management Important Message (Signed)
Important Message  Patient Details  Name: [name] MRN: [id number] Date of Birth: 1965-03-13   Important Message Given:  Yes - Medicare IM     Corey Harold 12/08/2022, 3:58 PM

## 2022-12-08 NOTE — Progress Notes (Signed)
PROGRESS NOTE    Wesley [name]  UXN:[id number] DOB: [birth year] DOA: 12/02/2022 PCP: Toma Deiters, MD   Brief Narrative:   57 y.o. male with medical history significant of COPD with chronic hypoxia, on 10 L/min per [place] at home, chronic pain syndrome, neuropathy, and osteoporosis who presented with worsening dyspnea and confusion.    Most information from his wife, at the bedside, patient can not give detail history due to confusion and being on full face mask, bipap.   For the last 5 days patient has been experiencing worsening dyspnea, wheezing and productive cough. His wife noted him to be more confused and somnolent. Very poor oral intake, very weak and deconditioned.  Because worsening symptoms she brought him to the ED.    Patient has advanced COPD, with chronic hypoxia, he has high oxygen requirements at baseline. He has been not ambulatory for many years. He continues to smoke cigarettes.    In the ED he was found in acute hypercapnic respiratory failure and was placed on Bipap.      Assessment & Plan:   Principal Problem:   Respiratory failure with hypoxia and hypercapnia (HCC) Active Problems:   Acute metabolic encephalopathy   Opiate dependence (HCC)   Protein calorie malnutrition (HCC)   Hypokalemia   Protein-calorie malnutrition, severe   Acute heart failure with preserved ejection fraction (HFpEF) (HCC)  Assessment and Plan:  Acute on Chronic Respiratory failure with hypoxia and hypercapnia Acute COPD exacerbation    Admitted to step down unit for ongoing bipap therapy  Aggressive bronchodilator therapy. Inhaled and systemic corticosteroids.  RT consultation requested due to complex treatment requirements  continue doxycycline (pt says his PCP has him on this TID but he has not been taking recently) Lasix to oral dosing 40 mg twice daily with careful monitoring of blood pressure levels Pt insisting on bumping [place] oxygen to 10L/min This places him at high  risk for recurrent CO2 narcosis and death, I explained to him and he verbalized understanding but he continues to change oxygen delivery when nurse leaves room Palliative care consultation requested for in depth goals of care discussions CT chest completed 9/30: consistent with chronic findings of advanced emphysema (updated pt with results on 10/1)  Mucomyst added 10/3 to assist with chest congestion   Acute metabolic encephalopathy - resolved CO2 narcosis  Treated initially with bipap when he was acutely encephalopathic Pt now refusing bipap I offered home NIV but he did not seem interested Pt high risk for recurrent CO2 narcosis given refusal to wear bipap consistently, refusal to stop inappropriately increasing oxygen delivery to 10 L despite spO2 being 99% on 5L, he is on high dose opioids, this is a perfect storm for recurrent severe and possibly fatal respiratory failure. I discussed with patient and he verbalized understanding but continues to refuse to change behavior.    Acute HFpEF - I suspect precipitated by acute respiratory failure and poor compliance with furosemide at home - he has been started on IV furosemide and has diuresed significantly in the last 24 hours -Change IV Lasix to twice daily dosing p.o. - monitor daily potassium and sodium  - check Mg in Am    Severe Protein calorie malnutrition Will consult nutrition for recommendations Protein and calorie supplementation ordered     Opiate dependence Chronic pain syndrome.  Patient on high doses of opiate analgesics.  Resumed MS contin TID Pt having aggressive opioid seeking behaviors Pt insisting on IV dilaudid 3-4 mg every 4  hours Because he is refusing to wear bipap, there is no reason to have him on IV opioids Resuming his home pain mgmt regimen, DC IV morphine, resume home oxycodone 30 mg Q4 hours Continue with home pregabalin.     DVT prophylaxis:Lovenox Code Status: DNR Family Communication: None at  bedside Disposition Plan:  Status is: Inpatient Remains inpatient appropriate because: Need for ongoing IV medications.   Consultants:  Palliative  Procedures:  None  Antimicrobials:  Anti-infectives (From admission, onward)    Start     Dose/Rate Route Frequency Ordered Stop   12/04/22 1000  doxycycline (VIBRA-TABS) tablet 100 mg        100 mg Oral Every 12 hours 12/04/22 0914     12/03/22 1100  doxycycline (VIBRAMYCIN) 100 mg in sodium chloride 0.9 % 250 mL IVPB  Status:  Discontinued        100 mg 125 mL/hr over 120 Minutes Intravenous Every 12 hours 12/03/22 1032 12/04/22 0914      Subjective: Patient seen and evaluated today with no significant improvement overnight.  He continues to require high amounts of nasal cannula oxygen.  He continues to have cough and significant sputum production.  Objective: Vitals:   12/08/22 0400 12/08/22 0600 12/08/22 0757 12/08/22 0800  BP: 121/85 94/67 (!) 98/35 (!) 91/45  Pulse: 100 90 88 91  Resp: 13 20 (!) 23 (!) 24  Temp: 98.5 F (36.9 C)   98.2 F (36.8 C)  TempSrc:    Oral  SpO2: 99% 98% 100% 100%  Weight:      Height:        Intake/Output Summary (Last 24 hours) at 12/08/2022 1017 Last data filed at 12/08/2022 0500 Gross per 24 hour  Intake --  Output 5300 ml  Net -5300 ml   Filed Weights   12/03/22 1421 12/04/22 0400 12/05/22 2200  Weight: 58.7 kg 59.4 kg 69.2 kg    Examination:  General exam: Appears calm and comfortable  Respiratory system: Clear to auscultation. Respiratory effort normal.  10 L nasal cannula oxygen. Cardiovascular system: S1 & S2 heard, RRR.  Gastrointestinal system: Abdomen is soft Central nervous system: Alert and awake Extremities: Significant 1-2+ pitting edema bilaterally. Skin: No significant lesions noted Psychiatry: Flat affect.    Data Reviewed: I have personally reviewed following labs and imaging studies  CBC: Recent Labs  Lab 12/02/22 1824 12/03/22 0338 12/05/22 0453  12/07/22 0316  WBC 11.5* 9.7 12.5* 11.1*  HGB 11.0* 9.9* 9.1* 9.9*  HCT 38.4* 33.9* 31.5* 34.9*  MCV 107.0* 106.6* 106.8* 108.0*  PLT 195 166 186 195   Basic Metabolic Panel: Recent Labs  Lab 12/03/22 0338 12/05/22 0453 12/06/22 0503 12/07/22 0316 12/08/22 0453  NA 139 138 136 138 134*  K 4.2 4.2 3.8 4.8 4.6  CL 85* 90* 86* 87* 82*  CO2 45* 37* 42* 40* 45*  GLUCOSE 132* 177* 117* 109* 170*  BUN 14 21* 20 26* 25*  CREATININE 0.45* 0.48* 0.50* 0.45* 0.55*  CALCIUM 8.8* 8.7* 8.7* 8.9 8.9  MG 1.9  --  2.1 2.1 2.2   GFR: Estimated Creatinine Clearance: 97.3 mL/min (A) (by C-G formula based on SCr of 0.55 mg/dL (L)). Liver Function Tests: Recent Labs  Lab 12/02/22 1824  AST 16  ALT 16  ALKPHOS 73  BILITOT 0.2*  PROT 7.7  ALBUMIN 4.2   No results for input(s): "LIPASE", "AMYLASE" in the last 168 hours. Recent Labs  Lab 12/02/22 1824  AMMONIA 24   Coagulation  Profile: No results for input(s): "INR", "PROTIME" in the last 168 hours. Cardiac Enzymes: No results for input(s): "CKTOTAL", "CKMB", "CKMBINDEX", "TROPONINI" in the last 168 hours. BNP (last 3 results) No results for input(s): "PROBNP" in the last 8760 hours. HbA1C: No results for input(s): "HGBA1C" in the last 72 hours. CBG: Recent Labs  Lab 12/02/22 1736  GLUCAP 100*   Lipid Profile: No results for input(s): "CHOL", "HDL", "LDLCALC", "TRIG", "CHOLHDL", "LDLDIRECT" in the last 72 hours. Thyroid Function Tests: No results for input(s): "TSH", "T4TOTAL", "FREET4", "T3FREE", "THYROIDAB" in the last 72 hours. Anemia Panel: No results for input(s): "VITAMINB12", "FOLATE", "FERRITIN", "TIBC", "IRON", "RETICCTPCT" in the last 72 hours. Sepsis Labs: No results for input(s): "PROCALCITON", "LATICACIDVEN" in the last 168 hours.  Recent Results (from the past 240 hour(s))  Culture, blood (routine x 2)     Status: None   Collection Time: 12/02/22  6:25 PM   Specimen: BLOOD  Result Value Ref Range Status    Specimen Description BLOOD RIGHT ANTECUBITAL  Final   Special Requests   Final    BOTTLES DRAWN AEROBIC AND ANAEROBIC Blood Culture adequate volume   Culture   Final    NO GROWTH 5 DAYS Performed at [facility], [address] [place], [place] [address]    Report Status 12/07/2022 FINAL  Final  Culture, blood (routine x 2)     Status: None   Collection Time: 12/02/22  7:19 PM   Specimen: BLOOD  Result Value Ref Range Status   Specimen Description BLOOD BLOOD RIGHT HAND  Final   Special Requests   Final    BOTTLES DRAWN AEROBIC AND ANAEROBIC Blood Culture adequate volume   Culture   Final    NO GROWTH 5 DAYS Performed at [facility], [address], [place], [place] [address]    Report Status 12/07/2022 FINAL  Final  MRSA Next Gen by PCR, Nasal     Status: None   Collection Time: 12/03/22  2:15 PM   Specimen: Nasal Mucosa; Nasal Swab  Result Value Ref Range Status   MRSA by PCR Next Gen NOT DETECTED NOT DETECTED Final    Comment: (NOTE) The GeneXpert MRSA Assay (FDA approved for NASAL specimens only), is one component of a comprehensive MRSA colonization surveillance program. It is not intended to diagnose MRSA infection nor to guide or monitor treatment for MRSA infections. Test performance is not FDA approved in patients less than 51 years old. Performed at [facility], [address] [place], [place] [address]          Radiology Studies: No results found.      Scheduled Meds:  acetylcysteine  3 mL Nebulization BID   aspirin  81 mg Oral Q breakfast   Chlorhexidine Gluconate Cloth  6 each Topical Q0600   doxycycline  100 mg Oral Q12H   enoxaparin (LOVENOX) injection  40 mg Subcutaneous Q24H   feeding supplement  237 mL Oral TID BM   furosemide  40 mg Oral BID   ipratropium-albuterol  3 mL Nebulization Q6H   methylPREDNISolone (SOLU-MEDROL) injection  40 mg Intravenous Q12H   mometasone-formoterol  2 puff Inhalation BID   morphine  30 mg Oral Q8H    multivitamin with minerals  1 tablet Oral Daily   nicotine  14 mg Transdermal Daily   pantoprazole  40 mg Oral QAC breakfast   pregabalin  150 mg Oral TID    LOS: 6 days    Time spent: 35 minutes  Wesley Sermon Hoover Brunette, DO [facility]  If 7PM-7AM, please contact night-coverage [email or web address] 12/08/2022, 10:17 AM

## 2022-12-09 ENCOUNTER — Inpatient Hospital Stay (HOSPITAL_COMMUNITY): Payer: Medicare HMO

## 2022-12-09 DIAGNOSIS — R0609 Other forms of dyspnea: Secondary | ICD-10-CM

## 2022-12-09 DIAGNOSIS — J9622 Acute and chronic respiratory failure with hypercapnia: Secondary | ICD-10-CM | POA: Diagnosis not present

## 2022-12-09 DIAGNOSIS — J9621 Acute and chronic respiratory failure with hypoxia: Secondary | ICD-10-CM | POA: Diagnosis not present

## 2022-12-09 LAB — BASIC METABOLIC PANEL
Anion gap: 8 (ref 5–15)
BUN: 27 mg/dL — ABNORMAL HIGH (ref 6–20)
CO2: 42 mmol/L — ABNORMAL HIGH (ref 22–32)
Calcium: 8.9 mg/dL (ref 8.9–10.3)
Chloride: 83 mmol/L — ABNORMAL LOW (ref 98–111)
Creatinine, Ser: 0.49 mg/dL — ABNORMAL LOW (ref 0.61–1.24)
GFR, Estimated: >60 mL/min (ref >60)
Glucose, Bld: 140 mg/dL — ABNORMAL HIGH (ref 70–99)
Potassium: 4.8 mmol/L (ref 3.5–5.1)
Sodium: 133 mmol/L — ABNORMAL LOW (ref 135–145)

## 2022-12-09 LAB — ECHOCARDIOGRAM COMPLETE
Height: 73 in
S' Lateral: 3.3 cm
Weight: 2440.93 [oz_av]

## 2022-12-09 LAB — MAGNESIUM: Magnesium: 2.4 mg/dL (ref 1.7–2.4)

## 2022-12-09 MED ORDER — POLYETHYLENE GLYCOL 3350 17 G PO PACK
17.0000 g | PACK | Freq: Every day | ORAL | Status: DC
  Administered 2022-12-09 – 2022-12-12 (×4): 17 g via ORAL
  Filled 2022-12-09 (×4): qty 1

## 2022-12-09 NOTE — Progress Notes (Signed)
Bipap order is PRN.  Not needed at this time. 

## 2022-12-09 NOTE — Progress Notes (Signed)
PROGRESS NOTE    Wesley [name]  HYQ:[id number] DOB: 04/27/1965 DOA: 12/02/2022 PCP: Toma Deiters, MD   Brief Narrative:   57 y.o. male with medical history significant of COPD with chronic hypoxia, on 10 L/min per [place] at home, chronic pain syndrome, neuropathy, and osteoporosis who presented with worsening dyspnea and confusion.    Most information from his wife, at the bedside, patient can not give detail history due to confusion and being on full face mask, bipap.   For the last 5 days patient has been experiencing worsening dyspnea, wheezing and productive cough. His wife noted him to be more confused and somnolent. Very poor oral intake, very weak and deconditioned.  Because worsening symptoms she brought him to the ED.    Patient has advanced COPD, with chronic hypoxia, he has high oxygen requirements at baseline. He has been not ambulatory for many years. He continues to smoke cigarettes.    In the ED he was found in acute hypercapnic respiratory failure and was placed on Bipap.      Assessment & Plan:   Principal Problem:   Respiratory failure with hypoxia and hypercapnia (HCC) Active Problems:   Acute metabolic encephalopathy   Opiate dependence (HCC)   Protein calorie malnutrition (HCC)   Hypokalemia   Protein-calorie malnutrition, severe   Acute heart failure with preserved ejection fraction (HFpEF) (HCC)  Assessment and Plan:  Acute on Chronic Respiratory failure with hypoxia and hypercapnia Acute COPD exacerbation    Admitted to step down unit for ongoing bipap therapy  Aggressive bronchodilator therapy. Inhaled and systemic corticosteroids.  RT consultation requested due to complex treatment requirements  continue doxycycline (pt says his PCP has him on this TID but he has not been taking recently) Lasix to oral dosing 40 mg twice daily with careful monitoring of blood pressure levels Pt insisting on bumping [place] oxygen to 10L/min This places him at high  risk for recurrent CO2 narcosis and death, I explained to him and he verbalized understanding but he continues to change oxygen delivery when nurse leaves room Palliative care consultation requested for in depth goals of care discussions CT chest completed 9/30: consistent with chronic findings of advanced emphysema (updated pt with results on 10/1)  Mucomyst added 10/3 to assist with chest congestion   Acute metabolic encephalopathy - resolved CO2 narcosis  Treated initially with bipap when he was acutely encephalopathic Pt now refusing bipap I offered home NIV but he did not seem interested Pt high risk for recurrent CO2 narcosis given refusal to wear bipap consistently, refusal to stop inappropriately increasing oxygen delivery to 10 L despite spO2 being 99% on 5L, he is on high dose opioids, this is a perfect storm for recurrent severe and possibly fatal respiratory failure. I discussed with patient and he verbalized understanding but continues to refuse to change behavior.    Acute HFpEF - I suspect precipitated by acute respiratory failure and poor compliance with furosemide at home - he has been started on IV furosemide and has diuresed significantly in the last 24 hours -Hold Lasix dosing for now given some tachycardia and hypotension -Check 2D echocardiogram   Severe Protein calorie malnutrition Will consult nutrition for recommendations Protein and calorie supplementation ordered     Opiate dependence Chronic pain syndrome.  Patient on high doses of opiate analgesics.  Resumed MS contin TID Pt having aggressive opioid seeking behaviors Pt insisting on IV dilaudid 3-4 mg every 4 hours Because he is refusing to wear bipap,  there is no reason to have him on IV opioids Resuming his home pain mgmt regimen, DC IV morphine, resume home oxycodone 30 mg Q4 hours Continue with home pregabalin.     DVT prophylaxis:Lovenox Code Status: DNR Family Communication: None at  bedside Disposition Plan:  Status is: Inpatient Remains inpatient appropriate because: Need for ongoing IV medications.   Consultants:  Palliative  Procedures:  None  Antimicrobials:  Anti-infectives (From admission, onward)    Start     Dose/Rate Route Frequency Ordered Stop   12/04/22 1000  doxycycline (VIBRA-TABS) tablet 100 mg        100 mg Oral Every 12 hours 12/04/22 0914     12/03/22 1100  doxycycline (VIBRAMYCIN) 100 mg in sodium chloride 0.9 % 250 mL IVPB  Status:  Discontinued        100 mg 125 mL/hr over 120 Minutes Intravenous Every 12 hours 12/03/22 1032 12/04/22 0914      Subjective: Patient seen and evaluated today with no significant improvement overnight.  He continues to require high amounts of nasal cannula oxygen.  He continues to have cough and significant sputum production.  Objective: Vitals:   12/09/22 0900 12/09/22 1000 12/09/22 1100 12/09/22 1217  BP:      Pulse: (!) 122 (!) 103 (!) 102   Resp: 19 18    Temp:    98.4 F (36.9 C)  TempSrc:    Oral  SpO2: 92% 99% 99%   Weight:      Height:        Intake/Output Summary (Last 24 hours) at 12/09/2022 1255 Last data filed at 12/09/2022 1205 Gross per 24 hour  Intake 240 ml  Output 4425 ml  Net -4185 ml   Filed Weights   12/03/22 1421 12/04/22 0400 12/05/22 2200  Weight: 58.7 kg 59.4 kg 69.2 kg    Examination:  General exam: Appears calm and comfortable  Respiratory system: Clear to auscultation. Respiratory effort normal.  10 L nasal cannula oxygen. Cardiovascular system: S1 & S2 heard, RRR.  Gastrointestinal system: Abdomen is soft Central nervous system: Alert and awake Extremities: Significant 1-2+ pitting edema bilaterally. Skin: No significant lesions noted Psychiatry: Flat affect.    Data Reviewed: I have personally reviewed following labs and imaging studies  CBC: Recent Labs  Lab 12/02/22 1824 12/03/22 0338 12/05/22 0453 12/07/22 0316  WBC 11.5* 9.7 12.5* 11.1*  HGB  11.0* 9.9* 9.1* 9.9*  HCT 38.4* 33.9* 31.5* 34.9*  MCV 107.0* 106.6* 106.8* 108.0*  PLT 195 166 186 195   Basic Metabolic Panel: Recent Labs  Lab 12/03/22 0338 12/05/22 0453 12/06/22 0503 12/07/22 0316 12/08/22 0453 12/09/22 0518  NA 139 138 136 138 134* 133*  K 4.2 4.2 3.8 4.8 4.6 4.8  CL 85* 90* 86* 87* 82* 83*  CO2 45* 37* 42* 40* 45* 42*  GLUCOSE 132* 177* 117* 109* 170* 140*  BUN 14 21* 20 26* 25* 27*  CREATININE 0.45* 0.48* 0.50* 0.45* 0.55* 0.49*  CALCIUM 8.8* 8.7* 8.7* 8.9 8.9 8.9  MG 1.9  --  2.1 2.1 2.2 2.4   GFR: Estimated Creatinine Clearance: 97.3 mL/min (A) (by C-G formula based on SCr of 0.49 mg/dL (L)). Liver Function Tests: Recent Labs  Lab 12/02/22 1824  AST 16  ALT 16  ALKPHOS 73  BILITOT 0.2*  PROT 7.7  ALBUMIN 4.2   No results for input(s): "LIPASE", "AMYLASE" in the last 168 hours. Recent Labs  Lab 12/02/22 1824  AMMONIA 24   Coagulation  Profile: No results for input(s): "INR", "PROTIME" in the last 168 hours. Cardiac Enzymes: No results for input(s): "CKTOTAL", "CKMB", "CKMBINDEX", "TROPONINI" in the last 168 hours. BNP (last 3 results) No results for input(s): "PROBNP" in the last 8760 hours. HbA1C: No results for input(s): "HGBA1C" in the last 72 hours. CBG: Recent Labs  Lab 12/02/22 1736  GLUCAP 100*   Lipid Profile: No results for input(s): "CHOL", "HDL", "LDLCALC", "TRIG", "CHOLHDL", "LDLDIRECT" in the last 72 hours. Thyroid Function Tests: No results for input(s): "TSH", "T4TOTAL", "FREET4", "T3FREE", "THYROIDAB" in the last 72 hours. Anemia Panel: No results for input(s): "VITAMINB12", "FOLATE", "FERRITIN", "TIBC", "IRON", "RETICCTPCT" in the last 72 hours. Sepsis Labs: No results for input(s): "PROCALCITON", "LATICACIDVEN" in the last 168 hours.  Recent Results (from the past 240 hour(s))  Culture, blood (routine x 2)     Status: None   Collection Time: 12/02/22  6:25 PM   Specimen: BLOOD  Result Value Ref Range  Status   Specimen Description BLOOD RIGHT ANTECUBITAL  Final   Special Requests   Final    BOTTLES DRAWN AEROBIC AND ANAEROBIC Blood Culture adequate volume   Culture   Final    NO GROWTH 5 DAYS Performed at [facility], [address] [place], [place] [address]    Report Status 12/07/2022 FINAL  Final  Culture, blood (routine x 2)     Status: None   Collection Time: 12/02/22  7:19 PM   Specimen: BLOOD  Result Value Ref Range Status   Specimen Description BLOOD BLOOD RIGHT HAND  Final   Special Requests   Final    BOTTLES DRAWN AEROBIC AND ANAEROBIC Blood Culture adequate volume   Culture   Final    NO GROWTH 5 DAYS Performed at [facility], [address] [place], [place] [address]    Report Status 12/07/2022 FINAL  Final  MRSA Next Gen by PCR, Nasal     Status: None   Collection Time: 12/03/22  2:15 PM   Specimen: Nasal Mucosa; Nasal Swab  Result Value Ref Range Status   MRSA by PCR Next Gen NOT DETECTED NOT DETECTED Final    Comment: (NOTE) The GeneXpert MRSA Assay (FDA approved for NASAL specimens only), is one component of a comprehensive MRSA colonization surveillance program. It is not intended to diagnose MRSA infection nor to guide or monitor treatment for MRSA infections. Test performance is not FDA approved in patients less than 76 years old. Performed at [facility], [address], [place], [place] [address]          Radiology Studies: No results found.      Scheduled Meds:  acetylcysteine  3 mL Nebulization BID   aspirin  81 mg Oral Q breakfast   Chlorhexidine Gluconate Cloth  6 each Topical Q0600   doxycycline  100 mg Oral Q12H   enoxaparin (LOVENOX) injection  40 mg Subcutaneous Q24H   feeding supplement  237 mL Oral TID BM   ipratropium-albuterol  3 mL Nebulization Q6H   methylPREDNISolone (SOLU-MEDROL) injection  40 mg Intravenous Q12H   mometasone-formoterol  2 puff Inhalation BID   morphine  30 mg Oral Q8H   multivitamin with minerals  1  tablet Oral Daily   nicotine  14 mg Transdermal Daily   pantoprazole  40 mg Oral QAC breakfast   pregabalin  150 mg Oral TID    LOS: 7 days    Time spent: 35 minutes    Marcellius Montagna Hoover Brunette, DO [facility]  If 7PM-7AM, please contact night-coverage [email or web address] 12/09/2022, 12:55 PM

## 2022-12-10 DIAGNOSIS — J9621 Acute and chronic respiratory failure with hypoxia: Secondary | ICD-10-CM | POA: Diagnosis not present

## 2022-12-10 DIAGNOSIS — J9622 Acute and chronic respiratory failure with hypercapnia: Secondary | ICD-10-CM | POA: Diagnosis not present

## 2022-12-10 MED ORDER — ACETYLCYSTEINE 20 % IN SOLN
3.0000 mL | Freq: Two times a day (BID) | RESPIRATORY_TRACT | Status: DC
  Administered 2022-12-11 – 2022-12-12 (×3): 3 mL via RESPIRATORY_TRACT
  Filled 2022-12-10 (×3): qty 4

## 2022-12-10 MED ORDER — FUROSEMIDE 40 MG PO TABS
40.0000 mg | ORAL_TABLET | Freq: Two times a day (BID) | ORAL | Status: DC
  Administered 2022-12-10 – 2022-12-12 (×5): 40 mg via ORAL
  Filled 2022-12-10 (×5): qty 1

## 2022-12-10 NOTE — Progress Notes (Signed)
PROGRESS NOTE    Wesley [name]  XBJ:[id number] DOB: 1965/07/19 DOA: 12/02/2022 PCP: Toma Deiters, MD   Brief Narrative:   57 y.o. male with medical history significant of COPD with chronic hypoxia, on 10 L/min per [place] at home, chronic pain syndrome, neuropathy, and osteoporosis who presented with worsening dyspnea and confusion.    Most information from his wife, at the bedside, patient can not give detail history due to confusion and being on full face mask, bipap.   For the last 5 days patient has been experiencing worsening dyspnea, wheezing and productive cough. His wife noted him to be more confused and somnolent. Very poor oral intake, very weak and deconditioned.  Because worsening symptoms she brought him to the ED.    Patient has advanced COPD, with chronic hypoxia, he has high oxygen requirements at baseline. He has been not ambulatory for many years. He continues to smoke cigarettes.    In the ED he was found in acute hypercapnic respiratory failure and was placed on Bipap.      Assessment & Plan:   Principal Problem:   Respiratory failure with hypoxia and hypercapnia (HCC) Active Problems:   Acute metabolic encephalopathy   Opiate dependence (HCC)   Protein calorie malnutrition (HCC)   Hypokalemia   Protein-calorie malnutrition, severe   Acute heart failure with preserved ejection fraction (HFpEF) (HCC)  Assessment and Plan:  Acute on Chronic Respiratory failure with hypoxia and hypercapnia Acute COPD exacerbation    Admitted to step down unit for ongoing bipap therapy  Aggressive bronchodilator therapy. Inhaled and systemic corticosteroids.  RT consultation requested due to complex treatment requirements  continue doxycycline (pt says his PCP has him on this TID but he has not been taking recently) Lasix to oral dosing 40 mg twice daily with careful monitoring of blood pressure levels Pt insisting on bumping [place] oxygen to 10L/min This places him at high  risk for recurrent CO2 narcosis and death, I explained to him and he verbalized understanding but he continues to change oxygen delivery when nurse leaves room Palliative care consultation requested for in depth goals of care discussions CT chest completed 9/30: consistent with chronic findings of advanced emphysema (updated pt with results on 10/1)  Mucomyst added 10/3 to assist with chest congestion   Acute metabolic encephalopathy - resolved CO2 narcosis  Treated initially with bipap when he was acutely encephalopathic Pt now refusing bipap I offered home NIV but he did not seem interested Pt high risk for recurrent CO2 narcosis given refusal to wear bipap consistently, refusal to stop inappropriately increasing oxygen delivery to 10 L despite spO2 being 99% on 5L, he is on high dose opioids, this is a perfect storm for recurrent severe and possibly fatal respiratory failure. I discussed with patient and he verbalized understanding but continues to refuse to change behavior.    Acute HFpEF - I suspect precipitated by acute respiratory failure and poor compliance with furosemide at home - he has been started on IV furosemide and has diuresed significantly in the last 24 hours -2D echocardiogram with preserved LVEF 60-65% -Blood pressures improved, plan to resume oral Lasix twice daily   Severe Protein calorie malnutrition Will consult nutrition for recommendations Protein and calorie supplementation ordered     Opiate dependence Chronic pain syndrome.  Patient on high doses of opiate analgesics.  Resumed MS contin TID Pt having aggressive opioid seeking behaviors Pt insisting on IV dilaudid 3-4 mg every 4 hours Because he is refusing  PROGRESS NOTE    Wesley [name]  XBJ:[id number] DOB: 1965/07/19 DOA: 12/02/2022 PCP: Toma Deiters, MD   Brief Narrative:   57 y.o. male with medical history significant of COPD with chronic hypoxia, on 10 L/min per [place] at home, chronic pain syndrome, neuropathy, and osteoporosis who presented with worsening dyspnea and confusion.    Most information from his wife, at the bedside, patient can not give detail history due to confusion and being on full face mask, bipap.   For the last 5 days patient has been experiencing worsening dyspnea, wheezing and productive cough. His wife noted him to be more confused and somnolent. Very poor oral intake, very weak and deconditioned.  Because worsening symptoms she brought him to the ED.    Patient has advanced COPD, with chronic hypoxia, he has high oxygen requirements at baseline. He has been not ambulatory for many years. He continues to smoke cigarettes.    In the ED he was found in acute hypercapnic respiratory failure and was placed on Bipap.      Assessment & Plan:   Principal Problem:   Respiratory failure with hypoxia and hypercapnia (HCC) Active Problems:   Acute metabolic encephalopathy   Opiate dependence (HCC)   Protein calorie malnutrition (HCC)   Hypokalemia   Protein-calorie malnutrition, severe   Acute heart failure with preserved ejection fraction (HFpEF) (HCC)  Assessment and Plan:  Acute on Chronic Respiratory failure with hypoxia and hypercapnia Acute COPD exacerbation    Admitted to step down unit for ongoing bipap therapy  Aggressive bronchodilator therapy. Inhaled and systemic corticosteroids.  RT consultation requested due to complex treatment requirements  continue doxycycline (pt says his PCP has him on this TID but he has not been taking recently) Lasix to oral dosing 40 mg twice daily with careful monitoring of blood pressure levels Pt insisting on bumping [place] oxygen to 10L/min This places him at high  risk for recurrent CO2 narcosis and death, I explained to him and he verbalized understanding but he continues to change oxygen delivery when nurse leaves room Palliative care consultation requested for in depth goals of care discussions CT chest completed 9/30: consistent with chronic findings of advanced emphysema (updated pt with results on 10/1)  Mu[place] added 10/3 to assist with chest congestion   Acute metabolic encephalopathy - resolved CO2 narcosis  Treated initially with bipap when he was acutely encephalopathic Pt now refusing bipap I offered home NIV but he did not seem interested Pt high risk for recurrent CO2 narcosis given refusal to wear bipap consistently, refusal to stop inappropriately increasing oxygen delivery to 10 L despite spO2 being 99% on 5L, he is on high dose opioids, this is a perfect storm for recurrent severe and possibly fatal respiratory failure. I discussed with patient and he verbalized understanding but continues to refuse to change behavior.    Acute HFpEF - I suspect precipitated by acute respiratory failure and poor compliance with furosemide at home - he has been started on IV furosemide and has diuresed significantly in the last 24 hours -2D echocardiogram with preserved LVEF 60-65% -Blood pressures improved, plan to resume oral Lasix twice daily   Severe Protein calorie malnutrition Will consult nutrition for recommendations Protein and calorie supplementation ordered     Opiate dependence Chronic pain syndrome.  Patient on high doses of opiate analgesics.  Resumed MS contin TID Pt having aggressive opioid seeking behaviors Pt insisting on IV dilaudid 3-4 mg every 4 hours Because he is refusing  input(s): "PROBNP" in the last 8760 hours. HbA1C: No results for input(s): "HGBA1C" in the last 72 hours. CBG: No results for input(s): "GLUCAP" in the last 168 hours.  Lipid Profile: No results for input(s): "CHOL", "HDL", "LDLCALC", "TRIG", "CHOLHDL", "LDLDIRECT" in the last 72 hours. Thyroid Function Tests: No results for input(s): "TSH", "T4TOTAL", "FREET4", "T3FREE", "THYROIDAB" in the last 72 hours. Anemia Panel: No results for input(s): "VITAMINB12", "FOLATE", "FERRITIN", "TIBC", "IRON", "RETICCTPCT" in the last 72 hours. Sepsis Labs: No results for input(s): "PROCALCITON", "LATICACIDVEN" in the last 168 hours.  Recent Results (from the past 240 hour(s))  Culture, blood (routine x 2)     Status: None   Collection Time: 12/02/22  6:25 PM   Specimen: BLOOD  Result Value Ref Range Status   Specimen Description BLOOD RIGHT ANTECUBITAL  Final   Special Requests   Final    BOTTLES DRAWN AEROBIC AND  ANAEROBIC Blood Culture adequate volume   Culture   Final    NO GROWTH 5 DAYS Performed at [facility], [address] [place], [place] [address]    Report Status 12/07/2022 FINAL  Final  Culture, blood (routine x 2)     Status: None   Collection Time: 12/02/22  7:19 PM   Specimen: BLOOD  Result Value Ref Range Status   Specimen Description BLOOD BLOOD RIGHT HAND  Final   Special Requests   Final    BOTTLES DRAWN AEROBIC AND ANAEROBIC Blood Culture adequate volume   Culture   Final    NO GROWTH 5 DAYS Performed at [facility], [address] [place], [place] [address]    Report Status 12/07/2022 FINAL  Final  MRSA Next Gen by PCR, Nasal     Status: None   Collection Time: 12/03/22  2:15 PM   Specimen: Nasal Mucosa; Nasal Swab  Result Value Ref Range Status   MRSA by PCR Next Gen NOT DETECTED NOT DETECTED Final    Comment: (NOTE) The GeneXpert MRSA Assay (FDA approved for NASAL specimens only), is one component of a comprehensive MRSA colonization surveillance program. It is not intended to diagnose MRSA infection nor to guide or monitor treatment for MRSA infections. Test performance is not FDA approved in patients less than 13 years old. Performed at [facility], [address], [place], [place] [address]          Radiology Studies: ECHOCARDIOGRAM COMPLETE  Result Date: 12/09/2022    ECHOCARDIOGRAM REPORT   Patient Name:   DAVION FLANNERY Date of Exam: 12/09/2022 Medical Rec #:  [id number]        Height:       73.0 in Accession #:    [id number]       Weight:       152.6 lb Date of Birth:  07-30-1965        BSA:          1.918 m Patient Age:    57 years         BP:           100/48 mmHg Patient Gender: M                HR:           108 bpm. Exam Location:  Inpatient Procedure: 2D Echo, Color Doppler and Cardiac Doppler Indications:    Dyspnea R06.00  History:        Patient has prior history of Echocardiogram examinations, most  input(s): "PROBNP" in the last 8760 hours. HbA1C: No results for input(s): "HGBA1C" in the last 72 hours. CBG: No results for input(s): "GLUCAP" in the last 168 hours.  Lipid Profile: No results for input(s): "CHOL", "HDL", "LDLCALC", "TRIG", "CHOLHDL", "LDLDIRECT" in the last 72 hours. Thyroid Function Tests: No results for input(s): "TSH", "T4TOTAL", "FREET4", "T3FREE", "THYROIDAB" in the last 72 hours. Anemia Panel: No results for input(s): "VITAMINB12", "FOLATE", "FERRITIN", "TIBC", "IRON", "RETICCTPCT" in the last 72 hours. Sepsis Labs: No results for input(s): "PROCALCITON", "LATICACIDVEN" in the last 168 hours.  Recent Results (from the past 240 hour(s))  Culture, blood (routine x 2)     Status: None   Collection Time: 12/02/22  6:25 PM   Specimen: BLOOD  Result Value Ref Range Status   Specimen Description BLOOD RIGHT ANTECUBITAL  Final   Special Requests   Final    BOTTLES DRAWN AEROBIC AND  ANAEROBIC Blood Culture adequate volume   Culture   Final    NO GROWTH 5 DAYS Performed at [facility], [address] [place], [place] [address]    Report Status 12/07/2022 FINAL  Final  Culture, blood (routine x 2)     Status: None   Collection Time: 12/02/22  7:19 PM   Specimen: BLOOD  Result Value Ref Range Status   Specimen Description BLOOD BLOOD RIGHT HAND  Final   Special Requests   Final    BOTTLES DRAWN AEROBIC AND ANAEROBIC Blood Culture adequate volume   Culture   Final    NO GROWTH 5 DAYS Performed at [facility], [address] [place], [place] [address]    Report Status 12/07/2022 FINAL  Final  MRSA Next Gen by PCR, Nasal     Status: None   Collection Time: 12/03/22  2:15 PM   Specimen: Nasal Mucosa; Nasal Swab  Result Value Ref Range Status   MRSA by PCR Next Gen NOT DETECTED NOT DETECTED Final    Comment: (NOTE) The GeneXpert MRSA Assay (FDA approved for NASAL specimens only), is one component of a comprehensive MRSA colonization surveillance program. It is not intended to diagnose MRSA infection nor to guide or monitor treatment for MRSA infections. Test performance is not FDA approved in patients less than 13 years old. Performed at [facility], [address], [place], [place] [address]          Radiology Studies: ECHOCARDIOGRAM COMPLETE  Result Date: 12/09/2022    ECHOCARDIOGRAM REPORT   Patient Name:   DAVION FLANNERY Date of Exam: 12/09/2022 Medical Rec #:  [id number]        Height:       73.0 in Accession #:    [id number]       Weight:       152.6 lb Date of Birth:  07-30-1965        BSA:          1.918 m Patient Age:    57 years         BP:           100/48 mmHg Patient Gender: M                HR:           108 bpm. Exam Location:  Inpatient Procedure: 2D Echo, Color Doppler and Cardiac Doppler Indications:    Dyspnea R06.00  History:        Patient has prior history of Echocardiogram examinations, most  input(s): "PROBNP" in the last 8760 hours. HbA1C: No results for input(s): "HGBA1C" in the last 72 hours. CBG: No results for input(s): "GLUCAP" in the last 168 hours.  Lipid Profile: No results for input(s): "CHOL", "HDL", "LDLCALC", "TRIG", "CHOLHDL", "LDLDIRECT" in the last 72 hours. Thyroid Function Tests: No results for input(s): "TSH", "T4TOTAL", "FREET4", "T3FREE", "THYROIDAB" in the last 72 hours. Anemia Panel: No results for input(s): "VITAMINB12", "FOLATE", "FERRITIN", "TIBC", "IRON", "RETICCTPCT" in the last 72 hours. Sepsis Labs: No results for input(s): "PROCALCITON", "LATICACIDVEN" in the last 168 hours.  Recent Results (from the past 240 hour(s))  Culture, blood (routine x 2)     Status: None   Collection Time: 12/02/22  6:25 PM   Specimen: BLOOD  Result Value Ref Range Status   Specimen Description BLOOD RIGHT ANTECUBITAL  Final   Special Requests   Final    BOTTLES DRAWN AEROBIC AND  ANAEROBIC Blood Culture adequate volume   Culture   Final    NO GROWTH 5 DAYS Performed at [facility], [address] [place], [place] [address]    Report Status 12/07/2022 FINAL  Final  Culture, blood (routine x 2)     Status: None   Collection Time: 12/02/22  7:19 PM   Specimen: BLOOD  Result Value Ref Range Status   Specimen Description BLOOD BLOOD RIGHT HAND  Final   Special Requests   Final    BOTTLES DRAWN AEROBIC AND ANAEROBIC Blood Culture adequate volume   Culture   Final    NO GROWTH 5 DAYS Performed at [facility], [address] [place], [place] [address]    Report Status 12/07/2022 FINAL  Final  MRSA Next Gen by PCR, Nasal     Status: None   Collection Time: 12/03/22  2:15 PM   Specimen: Nasal Mucosa; Nasal Swab  Result Value Ref Range Status   MRSA by PCR Next Gen NOT DETECTED NOT DETECTED Final    Comment: (NOTE) The GeneXpert MRSA Assay (FDA approved for NASAL specimens only), is one component of a comprehensive MRSA colonization surveillance program. It is not intended to diagnose MRSA infection nor to guide or monitor treatment for MRSA infections. Test performance is not FDA approved in patients less than 13 years old. Performed at [facility], [address], [place], [place] [address]          Radiology Studies: ECHOCARDIOGRAM COMPLETE  Result Date: 12/09/2022    ECHOCARDIOGRAM REPORT   Patient Name:   DAVION FLANNERY Date of Exam: 12/09/2022 Medical Rec #:  [id number]        Height:       73.0 in Accession #:    [id number]       Weight:       152.6 lb Date of Birth:  07-30-1965        BSA:          1.918 m Patient Age:    57 years         BP:           100/48 mmHg Patient Gender: M                HR:           108 bpm. Exam Location:  Inpatient Procedure: 2D Echo, Color Doppler and Cardiac Doppler Indications:    Dyspnea R06.00  History:        Patient has prior history of Echocardiogram examinations, most

## 2022-12-10 NOTE — Progress Notes (Signed)
Unable to obtain REDS clip reading due to low quality result x3.

## 2022-12-10 NOTE — Plan of Care (Signed)

## 2022-12-11 DIAGNOSIS — J9622 Acute and chronic respiratory failure with hypercapnia: Secondary | ICD-10-CM | POA: Diagnosis not present

## 2022-12-11 DIAGNOSIS — J9621 Acute and chronic respiratory failure with hypoxia: Secondary | ICD-10-CM | POA: Diagnosis not present

## 2022-12-11 DIAGNOSIS — Z7189 Other specified counseling: Secondary | ICD-10-CM | POA: Diagnosis not present

## 2022-12-11 DIAGNOSIS — Z515 Encounter for palliative care: Secondary | ICD-10-CM | POA: Diagnosis not present

## 2022-12-11 DIAGNOSIS — E43 Unspecified severe protein-calorie malnutrition: Secondary | ICD-10-CM | POA: Diagnosis not present

## 2022-12-11 LAB — CBC
HCT: 37 % — ABNORMAL LOW (ref 39.0–52.0)
Hemoglobin: 10.8 g/dL — ABNORMAL LOW (ref 13.0–17.0)
MCH: 30.6 pg (ref 26.0–34.0)
MCHC: 29.2 g/dL — ABNORMAL LOW (ref 30.0–36.0)
MCV: 104.8 fL — ABNORMAL HIGH (ref 80.0–100.0)
Platelets: 295 10*3/uL (ref 150–400)
RBC: 3.53 MIL/uL — ABNORMAL LOW (ref 4.22–5.81)
RDW: 13.5 % (ref 11.5–15.5)
WBC: 22.3 10*3/uL — ABNORMAL HIGH (ref 4.0–10.5)
nRBC: 0.1 % (ref 0.0–0.2)

## 2022-12-11 LAB — BASIC METABOLIC PANEL
Anion gap: 11 (ref 5–15)
BUN: 30 mg/dL — ABNORMAL HIGH (ref 6–20)
CO2: 42 mmol/L — ABNORMAL HIGH (ref 22–32)
Calcium: 8.9 mg/dL (ref 8.9–10.3)
Chloride: 82 mmol/L — ABNORMAL LOW (ref 98–111)
Creatinine, Ser: 0.52 mg/dL — ABNORMAL LOW (ref 0.61–1.24)
GFR, Estimated: >60 mL/min (ref >60)
Glucose, Bld: 129 mg/dL — ABNORMAL HIGH (ref 70–99)
Potassium: 4.3 mmol/L (ref 3.5–5.1)
Sodium: 135 mmol/L (ref 135–145)

## 2022-12-11 LAB — MAGNESIUM: Magnesium: 2.3 mg/dL (ref 1.7–2.4)

## 2022-12-11 NOTE — Progress Notes (Signed)
CKMB", "CKMBINDEX", "TROPONINI" in the last 168 hours. BNP (last 3 results) No results for input(s): "PROBNP" in the last 8760 hours. HbA1C: No results for input(s): "HGBA1C" in the last 72 hours. CBG: No results for input(s): "GLUCAP" in the last 168 hours.  Lipid Profile: No results for input(s): "CHOL", "HDL", "LDLCALC", "TRIG", "CHOLHDL", "LDLDIRECT" in the last 72 hours. Thyroid Function Tests: No results for input(s): "TSH", "T4TOTAL", "FREET4", "T3FREE", "THYROIDAB" in the last 72 hours. Anemia Panel: No results for input(s): "VITAMINB12", "FOLATE", "FERRITIN", "TIBC", "IRON", "RETICCTPCT" in the last 72 hours. Sepsis Labs: No results for input(s): "PROCALCITON", "LATICACIDVEN" in the last 168 hours.  Recent Results (from the past 240 hour(s))  Culture, blood (routine x 2)     Status: None   Collection Time: 12/02/22  6:25 PM   Specimen: BLOOD  Result Value Ref Range  Status   Specimen Description BLOOD RIGHT ANTECUBITAL  Final   Special Requests   Final    BOTTLES DRAWN AEROBIC AND ANAEROBIC Blood Culture adequate volume   Culture   Final    NO GROWTH 5 DAYS Performed at [facility], [address], [place], [place] [address]    Report Status 12/07/2022 FINAL  Final  Culture, blood (routine x 2)     Status: None   Collection Time: 12/02/22  7:19 PM   Specimen: BLOOD  Result Value Ref Range Status   Specimen Description BLOOD BLOOD RIGHT HAND  Final   Special Requests   Final    BOTTLES DRAWN AEROBIC AND ANAEROBIC Blood Culture adequate volume   Culture   Final    NO GROWTH 5 DAYS Performed at [facility], [address] [place], [place] [address]    Report Status 12/07/2022 FINAL  Final  MRSA Next Gen by PCR, Nasal     Status: None   Collection Time: 12/03/22  2:15 PM   Specimen: Nasal Mucosa; Nasal Swab  Result Value Ref Range Status   MRSA by PCR Next Gen NOT DETECTED NOT DETECTED Final    Comment: (NOTE) The GeneXpert MRSA Assay (FDA approved for NASAL specimens only), is one component of a comprehensive MRSA colonization surveillance program. It is not intended to diagnose MRSA infection nor to guide or monitor treatment for MRSA infections. Test performance is not FDA approved in patients less than 13 years old. Performed at [facility], [address], [place], [place] [address]          Radiology Studies: ECHOCARDIOGRAM COMPLETE  Result Date: 12/09/2022    ECHOCARDIOGRAM REPORT   Patient Name:   Wesley [name] Date of Exam: 12/09/2022 Medical Rec #:  [id number]        Height:       73.0 in Accession #:    [id number]       Weight:       152.6 lb Date of Birth:  Nov 21, 1965        BSA:          1.918 m Patient Age:    57 years         BP:           100/48 mmHg Patient Gender: M                HR:           108 bpm. Exam Location:  Inpatient Procedure: 2D Echo, Color Doppler and Cardiac Doppler Indications:    Dyspnea R06.00   History:  PROGRESS NOTE    Wesley [name]  ZOX:[id number] DOB: 1965-09-22 DOA: 12/02/2022 PCP: Toma Deiters, MD   Brief Narrative:   57 y.o. male with medical history significant of COPD with chronic hypoxia, on 10 L/min per [place] at home, chronic pain syndrome, neuropathy, and osteoporosis who presented with worsening dyspnea and confusion.    Most information from his wife, at the bedside, patient can not give detail history due to confusion and being on full face mask, bipap.   For the last 5 days patient has been experiencing worsening dyspnea, wheezing and productive cough. His wife noted him to be more confused and somnolent. Very poor oral intake, very weak and deconditioned.  Because worsening symptoms she brought him to the ED.    Patient has advanced COPD, with chronic hypoxia, he has high oxygen requirements at baseline. He has been not ambulatory for many years. He continues to smoke cigarettes.    In the ED he was found in acute hypercapnic respiratory failure and was placed on Bipap.      Assessment & Plan:   Principal Problem:   Respiratory failure with hypoxia and hypercapnia (HCC) Active Problems:   Acute metabolic encephalopathy   Opiate dependence (HCC)   Protein calorie malnutrition (HCC)   Hypokalemia   Protein-calorie malnutrition, severe   Acute heart failure with preserved ejection fraction (HFpEF) (HCC)  Assessment and Plan:  Acute on Chronic Respiratory failure with hypoxia and hypercapnia Acute COPD exacerbation    Admitted to step down unit for ongoing bipap therapy  Aggressive bronchodilator therapy. Inhaled and systemic corticosteroids.  RT consultation requested due to complex treatment requirements  continue doxycycline (pt says his PCP has him on this TID but he has not been taking recently) Lasix to oral dosing 40 mg twice daily with careful monitoring of blood pressure levels Pt insisting on bumping [place] oxygen to 10L/min This places him at high  risk for recurrent CO2 narcosis and death, I explained to him and he verbalized understanding but he continues to change oxygen delivery when nurse leaves room Palliative care consultation requested for in depth goals of care discussions CT chest completed 9/30: consistent with chronic findings of advanced emphysema (updated pt with results on 10/1)  Mucomyst added 10/3 to assist with chest congestion   Acute metabolic encephalopathy - resolved CO2 narcosis  Treated initially with bipap when he was acutely encephalopathic Pt now refusing bipap I offered home NIV but he did not seem interested Pt high risk for recurrent CO2 narcosis given refusal to wear bipap consistently, refusal to stop inappropriately increasing oxygen delivery to 10 L despite spO2 being 99% on 5L, he is on high dose opioids, this is a perfect storm for recurrent severe and possibly fatal respiratory failure. I discussed with patient and he verbalized understanding but continues to refuse to change behavior.    Acute HFpEF - I suspect precipitated by acute respiratory failure and poor compliance with furosemide at home - he has been started on IV furosemide and has diuresed significantly in the last 24 hours -2D echocardiogram with preserved LVEF 60-65% -Blood pressures improved, plan to resume oral Lasix twice daily   Severe Protein calorie malnutrition Will consult nutrition for recommendations Protein and calorie supplementation ordered     Opiate dependence Chronic pain syndrome.  Patient on high doses of opiate analgesics.  Resumed MS contin TID Pt having aggressive opioid seeking behaviors Pt insisting on IV dilaudid 3-4 mg every 4 hours Because he is refusing  PROGRESS NOTE    Wesley [name]  ZOX:[id number] DOB: 1965-09-22 DOA: 12/02/2022 PCP: Toma Deiters, MD   Brief Narrative:   57 y.o. male with medical history significant of COPD with chronic hypoxia, on 10 L/min per [place] at home, chronic pain syndrome, neuropathy, and osteoporosis who presented with worsening dyspnea and confusion.    Most information from his wife, at the bedside, patient can not give detail history due to confusion and being on full face mask, bipap.   For the last 5 days patient has been experiencing worsening dyspnea, wheezing and productive cough. His wife noted him to be more confused and somnolent. Very poor oral intake, very weak and deconditioned.  Because worsening symptoms she brought him to the ED.    Patient has advanced COPD, with chronic hypoxia, he has high oxygen requirements at baseline. He has been not ambulatory for many years. He continues to smoke cigarettes.    In the ED he was found in acute hypercapnic respiratory failure and was placed on Bipap.      Assessment & Plan:   Principal Problem:   Respiratory failure with hypoxia and hypercapnia (HCC) Active Problems:   Acute metabolic encephalopathy   Opiate dependence (HCC)   Protein calorie malnutrition (HCC)   Hypokalemia   Protein-calorie malnutrition, severe   Acute heart failure with preserved ejection fraction (HFpEF) (HCC)  Assessment and Plan:  Acute on Chronic Respiratory failure with hypoxia and hypercapnia Acute COPD exacerbation    Admitted to step down unit for ongoing bipap therapy  Aggressive bronchodilator therapy. Inhaled and systemic corticosteroids.  RT consultation requested due to complex treatment requirements  continue doxycycline (pt says his PCP has him on this TID but he has not been taking recently) Lasix to oral dosing 40 mg twice daily with careful monitoring of blood pressure levels Pt insisting on bumping [place] oxygen to 10L/min This places him at high  risk for recurrent CO2 narcosis and death, I explained to him and he verbalized understanding but he continues to change oxygen delivery when nurse leaves room Palliative care consultation requested for in depth goals of care discussions CT chest completed 9/30: consistent with chronic findings of advanced emphysema (updated pt with results on 10/1)  Mucomyst added 10/3 to assist with chest congestion   Acute metabolic encephalopathy - resolved CO2 narcosis  Treated initially with bipap when he was acutely encephalopathic Pt now refusing bipap I offered home NIV but he did not seem interested Pt high risk for recurrent CO2 narcosis given refusal to wear bipap consistently, refusal to stop inappropriately increasing oxygen delivery to 10 L despite spO2 being 99% on 5L, he is on high dose opioids, this is a perfect storm for recurrent severe and possibly fatal respiratory failure. I discussed with patient and he verbalized understanding but continues to refuse to change behavior.    Acute HFpEF - I suspect precipitated by acute respiratory failure and poor compliance with furosemide at home - he has been started on IV furosemide and has diuresed significantly in the last 24 hours -2D echocardiogram with preserved LVEF 60-65% -Blood pressures improved, plan to resume oral Lasix twice daily   Severe Protein calorie malnutrition Will consult nutrition for recommendations Protein and calorie supplementation ordered     Opiate dependence Chronic pain syndrome.  Patient on high doses of opiate analgesics.  Resumed MS contin TID Pt having aggressive opioid seeking behaviors Pt insisting on IV dilaudid 3-4 mg every 4 hours Because he is refusing  PROGRESS NOTE    Wesley [name]  ZOX:[id number] DOB: 1965-09-22 DOA: 12/02/2022 PCP: Toma Deiters, MD   Brief Narrative:   57 y.o. male with medical history significant of COPD with chronic hypoxia, on 10 L/min per [place] at home, chronic pain syndrome, neuropathy, and osteoporosis who presented with worsening dyspnea and confusion.    Most information from his wife, at the bedside, patient can not give detail history due to confusion and being on full face mask, bipap.   For the last 5 days patient has been experiencing worsening dyspnea, wheezing and productive cough. His wife noted him to be more confused and somnolent. Very poor oral intake, very weak and deconditioned.  Because worsening symptoms she brought him to the ED.    Patient has advanced COPD, with chronic hypoxia, he has high oxygen requirements at baseline. He has been not ambulatory for many years. He continues to smoke cigarettes.    In the ED he was found in acute hypercapnic respiratory failure and was placed on Bipap.      Assessment & Plan:   Principal Problem:   Respiratory failure with hypoxia and hypercapnia (HCC) Active Problems:   Acute metabolic encephalopathy   Opiate dependence (HCC)   Protein calorie malnutrition (HCC)   Hypokalemia   Protein-calorie malnutrition, severe   Acute heart failure with preserved ejection fraction (HFpEF) (HCC)  Assessment and Plan:  Acute on Chronic Respiratory failure with hypoxia and hypercapnia Acute COPD exacerbation    Admitted to step down unit for ongoing bipap therapy  Aggressive bronchodilator therapy. Inhaled and systemic corticosteroids.  RT consultation requested due to complex treatment requirements  continue doxycycline (pt says his PCP has him on this TID but he has not been taking recently) Lasix to oral dosing 40 mg twice daily with careful monitoring of blood pressure levels Pt insisting on bumping [place] oxygen to 10L/min This places him at high  risk for recurrent CO2 narcosis and death, I explained to him and he verbalized understanding but he continues to change oxygen delivery when nurse leaves room Palliative care consultation requested for in depth goals of care discussions CT chest completed 9/30: consistent with chronic findings of advanced emphysema (updated pt with results on 10/1)  Mucomyst added 10/3 to assist with chest congestion   Acute metabolic encephalopathy - resolved CO2 narcosis  Treated initially with bipap when he was acutely encephalopathic Pt now refusing bipap I offered home NIV but he did not seem interested Pt high risk for recurrent CO2 narcosis given refusal to wear bipap consistently, refusal to stop inappropriately increasing oxygen delivery to 10 L despite spO2 being 99% on 5L, he is on high dose opioids, this is a perfect storm for recurrent severe and possibly fatal respiratory failure. I discussed with patient and he verbalized understanding but continues to refuse to change behavior.    Acute HFpEF - I suspect precipitated by acute respiratory failure and poor compliance with furosemide at home - he has been started on IV furosemide and has diuresed significantly in the last 24 hours -2D echocardiogram with preserved LVEF 60-65% -Blood pressures improved, plan to resume oral Lasix twice daily   Severe Protein calorie malnutrition Will consult nutrition for recommendations Protein and calorie supplementation ordered     Opiate dependence Chronic pain syndrome.  Patient on high doses of opiate analgesics.  Resumed MS contin TID Pt having aggressive opioid seeking behaviors Pt insisting on IV dilaudid 3-4 mg every 4 hours Because he is refusing  PROGRESS NOTE    Wesley [name]  ZOX:[id number] DOB: 1965-09-22 DOA: 12/02/2022 PCP: Toma Deiters, MD   Brief Narrative:   57 y.o. male with medical history significant of COPD with chronic hypoxia, on 10 L/min per [place] at home, chronic pain syndrome, neuropathy, and osteoporosis who presented with worsening dyspnea and confusion.    Most information from his wife, at the bedside, patient can not give detail history due to confusion and being on full face mask, bipap.   For the last 5 days patient has been experiencing worsening dyspnea, wheezing and productive cough. His wife noted him to be more confused and somnolent. Very poor oral intake, very weak and deconditioned.  Because worsening symptoms she brought him to the ED.    Patient has advanced COPD, with chronic hypoxia, he has high oxygen requirements at baseline. He has been not ambulatory for many years. He continues to smoke cigarettes.    In the ED he was found in acute hypercapnic respiratory failure and was placed on Bipap.      Assessment & Plan:   Principal Problem:   Respiratory failure with hypoxia and hypercapnia (HCC) Active Problems:   Acute metabolic encephalopathy   Opiate dependence (HCC)   Protein calorie malnutrition (HCC)   Hypokalemia   Protein-calorie malnutrition, severe   Acute heart failure with preserved ejection fraction (HFpEF) (HCC)  Assessment and Plan:  Acute on Chronic Respiratory failure with hypoxia and hypercapnia Acute COPD exacerbation    Admitted to step down unit for ongoing bipap therapy  Aggressive bronchodilator therapy. Inhaled and systemic corticosteroids.  RT consultation requested due to complex treatment requirements  continue doxycycline (pt says his PCP has him on this TID but he has not been taking recently) Lasix to oral dosing 40 mg twice daily with careful monitoring of blood pressure levels Pt insisting on bumping [place] oxygen to 10L/min This places him at high  risk for recurrent CO2 narcosis and death, I explained to him and he verbalized understanding but he continues to change oxygen delivery when nurse leaves room Palliative care consultation requested for in depth goals of care discussions CT chest completed 9/30: consistent with chronic findings of advanced emphysema (updated pt with results on 10/1)  Mucomyst added 10/3 to assist with chest congestion   Acute metabolic encephalopathy - resolved CO2 narcosis  Treated initially with bipap when he was acutely encephalopathic Pt now refusing bipap I offered home NIV but he did not seem interested Pt high risk for recurrent CO2 narcosis given refusal to wear bipap consistently, refusal to stop inappropriately increasing oxygen delivery to 10 L despite spO2 being 99% on 5L, he is on high dose opioids, this is a perfect storm for recurrent severe and possibly fatal respiratory failure. I discussed with patient and he verbalized understanding but continues to refuse to change behavior.    Acute HFpEF - I suspect precipitated by acute respiratory failure and poor compliance with furosemide at home - he has been started on IV furosemide and has diuresed significantly in the last 24 hours -2D echocardiogram with preserved LVEF 60-65% -Blood pressures improved, plan to resume oral Lasix twice daily   Severe Protein calorie malnutrition Will consult nutrition for recommendations Protein and calorie supplementation ordered     Opiate dependence Chronic pain syndrome.  Patient on high doses of opiate analgesics.  Resumed MS contin TID Pt having aggressive opioid seeking behaviors Pt insisting on IV dilaudid 3-4 mg every 4 hours Because he is refusing

## 2022-12-11 NOTE — Progress Notes (Signed)
Palliative:   Wesley [name] is sitting on the edge of the bed in his room.  He appears acutely/chronically ill and quite frail, cachectic.  He greets me, making and mostly keeping eye contact.  He is alert and oriented, able to make his needs known.  There is no family at bedside at this time.  We talk about his acute and chronic health concerns, his frailty.  I shared that I understand he is open to the benefits of hospice care.  We talked at length about "treat the treatable" hospice care.  Provider choice offered.  He chooses [facility] [facility].  Wesley [name] shares that he believes he would need a hospital bed and bedside commode sometime in the near future.  I share that hospice is excellent for meeting needs quickly.  I shared the generalities of what hospice can provide and encouraged him to speak with hospice provider about specifics.  He is agreeable.  Conference with attending, bedside nursing staff, transition of care team related to patient condition, needs, goals of care, disposition.  Plan:    At this point continue to treat the treatable.  Home with the benefits of "treat the treatable" hospice care with [facility] (provider choice offered). Prognosis: 6 months or less would be expected based on disabling dyspnea at rest resulting in decreased functional capacity, decreasing functional status, end-stage COPD.  50 minutes  Lillia Carmel, NP Palliative medicine team Team phone [phone]

## 2022-12-11 NOTE — TOC Progression Note (Signed)
Transition of Care [facility]) - Progression Note    Patient Details  Name: ZAMIER EGGEBRECHT MRN: [id number] Date of Birth: 1966/02/01  Transition [facility] Care [facility]) CM/SW Contact  Diezel Mazur A Kelty Szafran, RN Phone Number: 12/11/2022, 11:27 AM  Clinical Narrative:    Palliative Consulted and consulted TOC to refer to [facility] for home with hospice. Referral sent. Keka updated. TOC following.   Expected Discharge Plan: Home w Hospice Care Barriers to Discharge: Continued Medical Work up  Expected Discharge Plan and Services       Living arrangements for the past 2 months: Single Family Home                             HH Agency: [facility] Date [facility] Agency Contacted: 12/11/22 Time HH Agency Contacted: 1127 Representative spoke with at [facility] Agency: Rae Halsted   Social Determinants of Health (SDOH) Interventions SDOH Screenings   Food Insecurity: No Food Insecurity (12/02/2022)  Housing: Low Risk  (12/02/2022)  Transportation Needs: No Transportation Needs (12/02/2022)  Utilities: Not At Risk (12/02/2022)  Tobacco Use: High Risk (12/05/2022)    Readmission Risk Interventions    12/05/2022    1:38 PM  Readmission Risk Prevention Plan  Transportation Screening Complete  PCP or Specialist Appt within 3-5 Days Not Complete  HRI or Home Care Consult Complete  Social Work Consult for Recovery Care Planning/Counseling Complete  Palliative Care Screening Complete  Medication Review Oceanographer) Complete

## 2022-12-11 NOTE — Plan of Care (Signed)
  Problem: Education: Goal: Knowledge of General Education information will improve Description: Including pain rating scale, medication(s)/side effects and non-pharmacologic comfort measures Outcome: Not Progressing   Problem: Health Behavior/Discharge Planning: Goal: Ability to manage health-related needs will improve Outcome: Progressing   Problem: Clinical Measurements: Goal: Ability to maintain clinical measurements within normal limits will improve Outcome: Progressing Goal: Will remain free from infection Outcome: Progressing Goal: Diagnostic test results will improve Outcome: Progressing Goal: Respiratory complications will improve Outcome: Not Progressing Goal: Cardiovascular complication will be avoided Outcome: Not Progressing   Problem: Activity: Goal: Risk for activity intolerance will decrease Outcome: Not Progressing   Problem: Nutrition: Goal: Adequate nutrition will be maintained Outcome: Progressing   Problem: Coping: Goal: Level of anxiety will decrease Outcome: Progressing   Problem: Elimination: Goal: Will not experience complications related to bowel motility Outcome: Progressing Goal: Will not experience complications related to urinary retention Outcome: Progressing   Problem: Pain Managment: Goal: General experience of comfort will improve Outcome: Not Progressing   Problem: Safety: Goal: Ability to remain free from injury will improve Outcome: Progressing   Problem: Skin Integrity: Goal: Risk for impaired skin integrity will decrease Outcome: Progressing

## 2022-12-12 DIAGNOSIS — J9622 Acute and chronic respiratory failure with hypercapnia: Secondary | ICD-10-CM | POA: Diagnosis not present

## 2022-12-12 DIAGNOSIS — J9621 Acute and chronic respiratory failure with hypoxia: Secondary | ICD-10-CM | POA: Diagnosis not present

## 2022-12-12 MED ORDER — ALBUTEROL SULFATE (2.5 MG/3ML) 0.083% IN NEBU
2.5000 mg | INHALATION_SOLUTION | RESPIRATORY_TRACT | Status: DC | PRN
  Administered 2022-12-12: 2.5 mg via RESPIRATORY_TRACT
  Filled 2022-12-12: qty 3

## 2022-12-12 MED ORDER — ENSURE ENLIVE PO LIQD: 237.0000 mL | Freq: Three times a day (TID) | ORAL | 12 refills | Status: DC

## 2022-12-12 MED ORDER — IPRATROPIUM-ALBUTEROL 0.5-2.5 (3) MG/3ML IN SOLN
3.0000 mL | Freq: Two times a day (BID) | RESPIRATORY_TRACT | Status: DC
  Administered 2022-12-12: 3 mL via RESPIRATORY_TRACT
  Filled 2022-12-12: qty 3

## 2022-12-12 MED ORDER — PREDNISONE 20 MG PO TABS: 40.0000 mg | ORAL_TABLET | Freq: Every day | ORAL | Status: DC

## 2022-12-12 MED ORDER — PREDNISONE 10 MG PO TABS: 40.0000 mg | ORAL_TABLET | Freq: Every day | ORAL | 0 refills | Status: AC

## 2022-12-12 MED ORDER — FLUTICASONE-SALMETEROL 250-50 MCG/ACT IN AEPB: 1.0000 | INHALATION_SPRAY | Freq: Two times a day (BID) | RESPIRATORY_TRACT | 0 refills | Status: DC

## 2022-12-12 MED ORDER — FUROSEMIDE 40 MG PO TABS: 40.0000 mg | ORAL_TABLET | Freq: Every day | ORAL | 1 refills | Status: DC

## 2022-12-12 MED ORDER — ALBUTEROL SULFATE HFA 108 (90 BASE) MCG/ACT IN AERS: 2.0000 | INHALATION_SPRAY | Freq: Four times a day (QID) | RESPIRATORY_TRACT | Status: DC | PRN

## 2022-12-12 NOTE — TOC Transition Note (Signed)
Transition of Care [facility]) - CM/SW Discharge Note   Patient Details  Name: Wesley [name] MRN: [id number] Date of Birth: 10/01/1965  Transition of Care [facility] CM/SW Contact:  Leitha Bleak, RN Phone Number: 12/12/2022, 12:19 PM   Clinical Narrative:   Patient discharging home with [facility] home hosp[facility]. Wife will pick him up at 3:30. Per [facility] Rn will meet them at 4:30 at the home. RN updated. Wife was asked to bring home oxygen for transport. Patient states he does not need a hospital bed today. Hospice will follow and order DME as needed.   Final next level of care: Home w Hospice Care Barriers to Discharge: Continued Medical Work up   Patient Goals and CMS Choice CMS Medicare.gov Compare Post Acute Care list provided to:: Patient    Discharge Placement                  Patient to be transferred to facility by: Wife Name of family member notified: wife Patient and family notified of of transfer: 12/12/22  Discharge Plan and Services Additional resources added to the After Visit Summary for      [facility] Agency: [facility] Date [facility] Agency Contacted: 12/11/22 Time HH Agency Contacted: 1127 Representative spoke with at [facility] Agency: Rae Halsted  Social Determinants of Health (SDOH) Interventions SDOH Screenings   Food Insecurity: No Food Insecurity (12/02/2022)  Housing: Low Risk  (12/02/2022)  Transportation Needs: No Transportation Needs (12/02/2022)  Utilities: Not At Risk (12/02/2022)  Tobacco Use: High Risk (12/05/2022)    Readmission Risk Interventions    12/05/2022    1:38 PM  Readmission Risk Prevention Plan  Transportation Screening Complete  PCP or Specialist Appt within 3-5 Days Not Complete  HRI or Home Care Consult Complete  Social Work Consult for Recovery Care Planning/Counseling Complete  Palliative Care Screening Complete  Medication Review Oceanographer) Complete

## 2022-12-12 NOTE — Discharge Summary (Signed)
1 tablet (81 mg total) by mouth daily with breakfast.   feeding supplement Liqd Take 237 mLs by mouth 3 (three) times daily between meals.   fluticasone-salmeterol 250-50 MCG/ACT Aepb Commonly known as: Advair Diskus Inhale 1 puff into the lungs 2 (two) times daily. What changed: medication strength   furosemide 40 MG  tablet Commonly known as: LASIX Take 1 tablet (40 mg total) by mouth daily.   morphine 30 MG 12 hr tablet Commonly known as: MS CONTIN Take 30 mg by mouth every 8 (eight) hours.   Narcan 4 MG/0.1ML Liqd nasal spray kit Generic drug: naloxone Place 4 mg into the nose as directed. To prevent overdose   oxycodone 30 MG immediate release tablet Commonly known as: ROXICODONE Take 30 mg by mouth every 4 (four) hours as needed (breakthrough pain).   OXYGEN Inhale 4-5 L into the lungs continuous.   predniSONE 10 MG tablet Commonly known as: DELTASONE Take 4 tablets (40 mg total) by mouth daily for 5 days.   pregabalin 150 MG capsule Commonly known as: LYRICA Take 150 mg by mouth 3 (three) times daily.   Trelegy Ellipta 200-62.5-25 MCG/ACT Aepb Generic drug: Fluticasone-Umeclidin-Vilant Inhale 1 puff into the lungs daily.               Durable Medical Equipment  (From admission, onward)           Start     Ordered   12/12/22 0000  DME Bedside commode       Question:  Patient needs a bedside commode to treat with the following condition  Answer:  Weakness   12/12/22 1104   12/12/22 0000  For home use only DME Hospital bed       Question Answer Comment  Length of Need Lifetime   Bed type Semi-electric      12/12/22 1104            Follow-up Information     Wesley Deiters, MD. Schedule an appointment as soon as possible for a visit in 1 week(s).   Specialty: Internal Medicine Contact information: [address] [place] [address] [phone]         [place], [facility] Go to.   Contact information: [address] [place] [address] [phone]                Allergies  Allergen Reactions   Ambien [Zolpidem Tartrate] Other (See Comments)    Hallucinations  Altered mental state   Melatonin Itching    Consultations: Palliative   Procedures/Studies: ECHOCARDIOGRAM COMPLETE  Result Date: 12/09/2022     ECHOCARDIOGRAM REPORT   Patient Name:   Wesley [name] Date of Exam: 12/09/2022 Medical Rec #:  [id number]        Height:       73.0 in Accession #:    [id number]       Weight:       152.6 lb Date of Birth:  October 16, 1965        BSA:          1.918 m Patient Age:    57 years         BP:           100/48 mmHg Patient Gender: M                HR:           108 bpm. Exam Location:  Inpatient Procedure: 2D Echo, Color  Physician Discharge Summary  Wesley [name]:[id number] DOB: 1965/04/30 DOA: 12/02/2022  PCP: Wesley Deiters, MD  Admit date: 12/02/2022  Discharge date: 12/12/2022  Admitted From:Home  Disposition:  Home with hospice  Recommendations for Outpatient Follow-up:  Follow up with PCP in 1-2 weeks Follow-up with ophthalmology with referral sent given decrease in visual acuity Continue Lasix 40 mg daily as prescribed along with other home medications and continue to use leg elevation and compression stockings Prednisone 40 mg daily for 5 days Follow-up with hospice agency at home  Home Health: Yes with hospice  Equipment/Devices: Bedside commode and hospital bed, has nasal cannula oxygen up to 10 L/min  Discharge Condition:Stable  CODE STATUS: DNR  Diet recommendation: Heart Healthy  Brief/Interim Summary: 57 y.o. male with medical history significant of COPD with chronic hypoxia, on 10 L/min per [place] at home, chronic pain syndrome, neuropathy, and osteoporosis who presented with worsening dyspnea and confusion.  He was admitted with acute hypercapnic encephalopathy in the setting of acute COPD exacerbation with acute on chronic hypoxemic and hypercapnic respiratory failure.  He was also noted to have acute HFpEF.  He has been diuresed aggressively over the last several days and had required IV Solu-Medrol for multiple days as well including breathing treatments.  He now appears to have reached maximal medical benefit throughout the course of this hospitalization and palliative was consulted to address goals of care.  He seems agreeable to home hospice services as his prognosis is less than 6 months at this point given the severity of his COPD as well as chronic pain syndrome.  Discharge Diagnoses:  Principal Problem:   Respiratory failure with hypoxia and hypercapnia (HCC) Active Problems:   Acute metabolic encephalopathy   Opiate dependence (HCC)   Protein calorie malnutrition  (HCC)   Hypokalemia   Protein-calorie malnutrition, severe   Acute heart failure with preserved ejection fraction (HFpEF) (HCC)  Principal discharge diagnosis: Acute hypercapnic encephalopathy in the setting of acute on chronic hypoxemic and hypercapnic respiratory failure-multifactorial with COPD exacerbation and acute HFpEF.  Discharge Instructions  Discharge Instructions     Ambulatory referral to Ophthalmology   Complete by: As directed    DME Bedside commode   Complete by: As directed    Patient needs a bedside commode to treat with the following condition: Weakness   Diet - low sodium heart healthy   Complete by: As directed    For home use only DME Hospital bed   Complete by: As directed    Length of Need: Lifetime   Bed type: Semi-electric   Increase activity slowly   Complete by: As directed       Allergies as of 12/12/2022       Reactions   Ambien [zolpidem Tartrate] Other (See Comments)   Hallucinations  Altered mental state   Melatonin Itching        Medication List     TAKE these medications    acetaminophen 325 MG tablet Commonly known as: TYLENOL Take 2 tablets by mouth every 4 (four) hours as needed.   albuterol (2.5 MG/3ML) 0.083% nebulizer solution Commonly known as: PROVENTIL Take 3 mLs (2.5 mg total) by nebulization every 6 (six) hours as needed for wheezing or shortness of breath.   albuterol 108 (90 Base) MCG/ACT inhaler Commonly known as: VENTOLIN HFA Inhale 2 puffs into the lungs every 4 (four) hours as needed for wheezing or shortness of breath. Shortness of breath   aspirin 81 MG chewable tablet Chew  1 tablet (81 mg total) by mouth daily with breakfast.   feeding supplement Liqd Take 237 mLs by mouth 3 (three) times daily between meals.   fluticasone-salmeterol 250-50 MCG/ACT Aepb Commonly known as: Advair Diskus Inhale 1 puff into the lungs 2 (two) times daily. What changed: medication strength   furosemide 40 MG  tablet Commonly known as: LASIX Take 1 tablet (40 mg total) by mouth daily.   morphine 30 MG 12 hr tablet Commonly known as: MS CONTIN Take 30 mg by mouth every 8 (eight) hours.   Narcan 4 MG/0.1ML Liqd nasal spray kit Generic drug: naloxone Place 4 mg into the nose as directed. To prevent overdose   oxycodone 30 MG immediate release tablet Commonly known as: ROXICODONE Take 30 mg by mouth every 4 (four) hours as needed (breakthrough pain).   OXYGEN Inhale 4-5 L into the lungs continuous.   predniSONE 10 MG tablet Commonly known as: DELTASONE Take 4 tablets (40 mg total) by mouth daily for 5 days.   pregabalin 150 MG capsule Commonly known as: LYRICA Take 150 mg by mouth 3 (three) times daily.   Trelegy Ellipta 200-62.5-25 MCG/ACT Aepb Generic drug: Fluticasone-Umeclidin-Vilant Inhale 1 puff into the lungs daily.               Durable Medical Equipment  (From admission, onward)           Start     Ordered   12/12/22 0000  DME Bedside commode       Question:  Patient needs a bedside commode to treat with the following condition  Answer:  Weakness   12/12/22 1104   12/12/22 0000  For home use only DME Hospital bed       Question Answer Comment  Length of Need Lifetime   Bed type Semi-electric      12/12/22 1104            Follow-up Information     Wesley Deiters, MD. Schedule an appointment as soon as possible for a visit in 1 week(s).   Specialty: Internal Medicine Contact information: [address] [place] [address] [phone]         [place], [facility] Go to.   Contact information: [address] [place] [address] [phone]                Allergies  Allergen Reactions   Ambien [Zolpidem Tartrate] Other (See Comments)    Hallucinations  Altered mental state   Melatonin Itching    Consultations: Palliative   Procedures/Studies: ECHOCARDIOGRAM COMPLETE  Result Date: 12/09/2022     ECHOCARDIOGRAM REPORT   Patient Name:   Wesley [name] Date of Exam: 12/09/2022 Medical Rec #:  [id number]        Height:       73.0 in Accession #:    [id number]       Weight:       152.6 lb Date of Birth:  October 16, 1965        BSA:          1.918 m Patient Age:    57 years         BP:           100/48 mmHg Patient Gender: M                HR:           108 bpm. Exam Location:  Inpatient Procedure: 2D Echo, Color  1 tablet (81 mg total) by mouth daily with breakfast.   feeding supplement Liqd Take 237 mLs by mouth 3 (three) times daily between meals.   fluticasone-salmeterol 250-50 MCG/ACT Aepb Commonly known as: Advair Diskus Inhale 1 puff into the lungs 2 (two) times daily. What changed: medication strength   furosemide 40 MG  tablet Commonly known as: LASIX Take 1 tablet (40 mg total) by mouth daily.   morphine 30 MG 12 hr tablet Commonly known as: MS CONTIN Take 30 mg by mouth every 8 (eight) hours.   Narcan 4 MG/0.1ML Liqd nasal spray kit Generic drug: naloxone Place 4 mg into the nose as directed. To prevent overdose   oxycodone 30 MG immediate release tablet Commonly known as: ROXICODONE Take 30 mg by mouth every 4 (four) hours as needed (breakthrough pain).   OXYGEN Inhale 4-5 L into the lungs continuous.   predniSONE 10 MG tablet Commonly known as: DELTASONE Take 4 tablets (40 mg total) by mouth daily for 5 days.   pregabalin 150 MG capsule Commonly known as: LYRICA Take 150 mg by mouth 3 (three) times daily.   Trelegy Ellipta 200-62.5-25 MCG/ACT Aepb Generic drug: Fluticasone-Umeclidin-Vilant Inhale 1 puff into the lungs daily.               Durable Medical Equipment  (From admission, onward)           Start     Ordered   12/12/22 0000  DME Bedside commode       Question:  Patient needs a bedside commode to treat with the following condition  Answer:  Weakness   12/12/22 1104   12/12/22 0000  For home use only DME Hospital bed       Question Answer Comment  Length of Need Lifetime   Bed type Semi-electric      12/12/22 1104            Follow-up Information     Wesley Deiters, MD. Schedule an appointment as soon as possible for a visit in 1 week(s).   Specialty: Internal Medicine Contact information: [address] [place] [address] [phone]         [place], [facility] Go to.   Contact information: [address] [place] [address] [phone]                Allergies  Allergen Reactions   Ambien [Zolpidem Tartrate] Other (See Comments)    Hallucinations  Altered mental state   Melatonin Itching    Consultations: Palliative   Procedures/Studies: ECHOCARDIOGRAM COMPLETE  Result Date: 12/09/2022     ECHOCARDIOGRAM REPORT   Patient Name:   Wesley [name] Date of Exam: 12/09/2022 Medical Rec #:  [id number]        Height:       73.0 in Accession #:    [id number]       Weight:       152.6 lb Date of Birth:  October 16, 1965        BSA:          1.918 m Patient Age:    57 years         BP:           100/48 mmHg Patient Gender: M                HR:           108 bpm. Exam Location:  Inpatient Procedure: 2D Echo, Color  1 tablet (81 mg total) by mouth daily with breakfast.   feeding supplement Liqd Take 237 mLs by mouth 3 (three) times daily between meals.   fluticasone-salmeterol 250-50 MCG/ACT Aepb Commonly known as: Advair Diskus Inhale 1 puff into the lungs 2 (two) times daily. What changed: medication strength   furosemide 40 MG  tablet Commonly known as: LASIX Take 1 tablet (40 mg total) by mouth daily.   morphine 30 MG 12 hr tablet Commonly known as: MS CONTIN Take 30 mg by mouth every 8 (eight) hours.   Narcan 4 MG/0.1ML Liqd nasal spray kit Generic drug: naloxone Place 4 mg into the nose as directed. To prevent overdose   oxycodone 30 MG immediate release tablet Commonly known as: ROXICODONE Take 30 mg by mouth every 4 (four) hours as needed (breakthrough pain).   OXYGEN Inhale 4-5 L into the lungs continuous.   predniSONE 10 MG tablet Commonly known as: DELTASONE Take 4 tablets (40 mg total) by mouth daily for 5 days.   pregabalin 150 MG capsule Commonly known as: LYRICA Take 150 mg by mouth 3 (three) times daily.   Trelegy Ellipta 200-62.5-25 MCG/ACT Aepb Generic drug: Fluticasone-Umeclidin-Vilant Inhale 1 puff into the lungs daily.               Durable Medical Equipment  (From admission, onward)           Start     Ordered   12/12/22 0000  DME Bedside commode       Question:  Patient needs a bedside commode to treat with the following condition  Answer:  Weakness   12/12/22 1104   12/12/22 0000  For home use only DME Hospital bed       Question Answer Comment  Length of Need Lifetime   Bed type Semi-electric      12/12/22 1104            Follow-up Information     Wesley Deiters, MD. Schedule an appointment as soon as possible for a visit in 1 week(s).   Specialty: Internal Medicine Contact information: [address] [place] [address] [phone]         [place], [facility] Go to.   Contact information: [address] [place] [address] [phone]                Allergies  Allergen Reactions   Ambien [Zolpidem Tartrate] Other (See Comments)    Hallucinations  Altered mental state   Melatonin Itching    Consultations: Palliative   Procedures/Studies: ECHOCARDIOGRAM COMPLETE  Result Date: 12/09/2022     ECHOCARDIOGRAM REPORT   Patient Name:   Wesley [name] Date of Exam: 12/09/2022 Medical Rec #:  [id number]        Height:       73.0 in Accession #:    [id number]       Weight:       152.6 lb Date of Birth:  October 16, 1965        BSA:          1.918 m Patient Age:    57 years         BP:           100/48 mmHg Patient Gender: M                HR:           108 bpm. Exam Location:  Inpatient Procedure: 2D Echo, Color  1 tablet (81 mg total) by mouth daily with breakfast.   feeding supplement Liqd Take 237 mLs by mouth 3 (three) times daily between meals.   fluticasone-salmeterol 250-50 MCG/ACT Aepb Commonly known as: Advair Diskus Inhale 1 puff into the lungs 2 (two) times daily. What changed: medication strength   furosemide 40 MG  tablet Commonly known as: LASIX Take 1 tablet (40 mg total) by mouth daily.   morphine 30 MG 12 hr tablet Commonly known as: MS CONTIN Take 30 mg by mouth every 8 (eight) hours.   Narcan 4 MG/0.1ML Liqd nasal spray kit Generic drug: naloxone Place 4 mg into the nose as directed. To prevent overdose   oxycodone 30 MG immediate release tablet Commonly known as: ROXICODONE Take 30 mg by mouth every 4 (four) hours as needed (breakthrough pain).   OXYGEN Inhale 4-5 L into the lungs continuous.   predniSONE 10 MG tablet Commonly known as: DELTASONE Take 4 tablets (40 mg total) by mouth daily for 5 days.   pregabalin 150 MG capsule Commonly known as: LYRICA Take 150 mg by mouth 3 (three) times daily.   Trelegy Ellipta 200-62.5-25 MCG/ACT Aepb Generic drug: Fluticasone-Umeclidin-Vilant Inhale 1 puff into the lungs daily.               Durable Medical Equipment  (From admission, onward)           Start     Ordered   12/12/22 0000  DME Bedside commode       Question:  Patient needs a bedside commode to treat with the following condition  Answer:  Weakness   12/12/22 1104   12/12/22 0000  For home use only DME Hospital bed       Question Answer Comment  Length of Need Lifetime   Bed type Semi-electric      12/12/22 1104            Follow-up Information     Wesley Deiters, MD. Schedule an appointment as soon as possible for a visit in 1 week(s).   Specialty: Internal Medicine Contact information: [address] [place] [address] [phone]         [place], [facility] Go to.   Contact information: [address] [place] [address] [phone]                Allergies  Allergen Reactions   Ambien [Zolpidem Tartrate] Other (See Comments)    Hallucinations  Altered mental state   Melatonin Itching    Consultations: Palliative   Procedures/Studies: ECHOCARDIOGRAM COMPLETE  Result Date: 12/09/2022     ECHOCARDIOGRAM REPORT   Patient Name:   Wesley [name] Date of Exam: 12/09/2022 Medical Rec #:  [id number]        Height:       73.0 in Accession #:    [id number]       Weight:       152.6 lb Date of Birth:  October 16, 1965        BSA:          1.918 m Patient Age:    57 years         BP:           100/48 mmHg Patient Gender: M                HR:           108 bpm. Exam Location:  Inpatient Procedure: 2D Echo, Color  Physician Discharge Summary  Wesley [name]:[id number] DOB: 1965/04/30 DOA: 12/02/2022  PCP: Wesley Deiters, MD  Admit date: 12/02/2022  Discharge date: 12/12/2022  Admitted From:Home  Disposition:  Home with hospice  Recommendations for Outpatient Follow-up:  Follow up with PCP in 1-2 weeks Follow-up with ophthalmology with referral sent given decrease in visual acuity Continue Lasix 40 mg daily as prescribed along with other home medications and continue to use leg elevation and compression stockings Prednisone 40 mg daily for 5 days Follow-up with hospice agency at home  Home Health: Yes with hospice  Equipment/Devices: Bedside commode and hospital bed, has nasal cannula oxygen up to 10 L/min  Discharge Condition:Stable  CODE STATUS: DNR  Diet recommendation: Heart Healthy  Brief/Interim Summary: 57 y.o. male with medical history significant of COPD with chronic hypoxia, on 10 L/min per [place] at home, chronic pain syndrome, neuropathy, and osteoporosis who presented with worsening dyspnea and confusion.  He was admitted with acute hypercapnic encephalopathy in the setting of acute COPD exacerbation with acute on chronic hypoxemic and hypercapnic respiratory failure.  He was also noted to have acute HFpEF.  He has been diuresed aggressively over the last several days and had required IV Solu-Medrol for multiple days as well including breathing treatments.  He now appears to have reached maximal medical benefit throughout the course of this hospitalization and palliative was consulted to address goals of care.  He seems agreeable to home hospice services as his prognosis is less than 6 months at this point given the severity of his COPD as well as chronic pain syndrome.  Discharge Diagnoses:  Principal Problem:   Respiratory failure with hypoxia and hypercapnia (HCC) Active Problems:   Acute metabolic encephalopathy   Opiate dependence (HCC)   Protein calorie malnutrition  (HCC)   Hypokalemia   Protein-calorie malnutrition, severe   Acute heart failure with preserved ejection fraction (HFpEF) (HCC)  Principal discharge diagnosis: Acute hypercapnic encephalopathy in the setting of acute on chronic hypoxemic and hypercapnic respiratory failure-multifactorial with COPD exacerbation and acute HFpEF.  Discharge Instructions  Discharge Instructions     Ambulatory referral to Ophthalmology   Complete by: As directed    DME Bedside commode   Complete by: As directed    Patient needs a bedside commode to treat with the following condition: Weakness   Diet - low sodium heart healthy   Complete by: As directed    For home use only DME Hospital bed   Complete by: As directed    Length of Need: Lifetime   Bed type: Semi-electric   Increase activity slowly   Complete by: As directed       Allergies as of 12/12/2022       Reactions   Ambien [zolpidem Tartrate] Other (See Comments)   Hallucinations  Altered mental state   Melatonin Itching        Medication List     TAKE these medications    acetaminophen 325 MG tablet Commonly known as: TYLENOL Take 2 tablets by mouth every 4 (four) hours as needed.   albuterol (2.5 MG/3ML) 0.083% nebulizer solution Commonly known as: PROVENTIL Take 3 mLs (2.5 mg total) by nebulization every 6 (six) hours as needed for wheezing or shortness of breath.   albuterol 108 (90 Base) MCG/ACT inhaler Commonly known as: VENTOLIN HFA Inhale 2 puffs into the lungs every 4 (four) hours as needed for wheezing or shortness of breath. Shortness of breath   aspirin 81 MG chewable tablet Chew

## 2022-12-12 NOTE — Consult Note (Signed)
[facility] Customer service manager [facility]) Accountable Care Organization (ACO) [facility] Liaison Note  12/12/2022  Wesley [name] 12-13-1965 [id number]  Location: [facility] RN Hospital Liaison screened the patient remotely at [facility].  Insurance: [employer] Advantage   Wesley [name] is a 57 y.o. male who is a Primary Care Patient of Hasanaj, Myra Gianotti, MD. The patient was screened for  readmission hospitalization with noted high risk score for unplanned readmission risk with 1 IP in 6 months.  The patient was assessed for potential [facility]) Care Management service needs for post hospital transition for care coordination. Review of patient's electronic medical record reveals patient was admitted with Acute Respiratory Failure with hypoxia. TOC documentation indicates pt will discharge home with hospice ([facility]). No other anticipated needs via community at this time.   [facility] Care Management/Population Health does not replace or interfere with any arrangements made by the Inpatient Transition of Care team.   For questions contact:   Elliot Cousin, RN, [facility] Liaison [place]   Population Health Office Hours MTWF  8:00 am-6:00 pm [phone] mobile [phone] [Office toll free line] Office Hours are M-F 8:30 - 5 pm [email or web address]

## 2023-01-12 DIAGNOSIS — R269 Unspecified abnormalities of gait and mobility: Secondary | ICD-10-CM | POA: Diagnosis not present

## 2023-01-12 DIAGNOSIS — I279 Pulmonary heart disease, unspecified: Secondary | ICD-10-CM | POA: Diagnosis not present

## 2023-01-12 DIAGNOSIS — J441 Chronic obstructive pulmonary disease with (acute) exacerbation: Secondary | ICD-10-CM | POA: Diagnosis not present

## 2023-04-07 DEATH — deceased
